# Patient Record
Sex: Male | Born: 1940
Health system: Southern US, Community
[De-identification: ages and names within clinical notes are randomized; demographics above are authoritative.]

## PROBLEM LIST (undated history)

## (undated) DIAGNOSIS — F32A Depression, unspecified: Secondary | ICD-10-CM

## (undated) DIAGNOSIS — F329 Major depressive disorder, single episode, unspecified: Secondary | ICD-10-CM

## (undated) DIAGNOSIS — R42 Dizziness and giddiness: Secondary | ICD-10-CM

## (undated) DIAGNOSIS — L03031 Cellulitis of right toe: Secondary | ICD-10-CM

## (undated) DIAGNOSIS — L02612 Cutaneous abscess of left foot: Secondary | ICD-10-CM

## (undated) DIAGNOSIS — R51 Headache: Secondary | ICD-10-CM

## (undated) DIAGNOSIS — R519 Headache, unspecified: Secondary | ICD-10-CM

## (undated) DIAGNOSIS — M79671 Pain in right foot: Secondary | ICD-10-CM

## (undated) DIAGNOSIS — Z01810 Encounter for preprocedural cardiovascular examination: Secondary | ICD-10-CM

## (undated) DIAGNOSIS — N183 Chronic kidney disease, stage 3 unspecified: Secondary | ICD-10-CM

## (undated) DIAGNOSIS — I739 Peripheral vascular disease, unspecified: Secondary | ICD-10-CM

## (undated) DIAGNOSIS — I639 Cerebral infarction, unspecified: Secondary | ICD-10-CM

## (undated) DIAGNOSIS — E785 Hyperlipidemia, unspecified: Secondary | ICD-10-CM

## (undated) DIAGNOSIS — K5900 Constipation, unspecified: Secondary | ICD-10-CM

## (undated) DIAGNOSIS — R011 Cardiac murmur, unspecified: Secondary | ICD-10-CM

## (undated) DIAGNOSIS — E538 Deficiency of other specified B group vitamins: Secondary | ICD-10-CM

## (undated) DIAGNOSIS — M869 Osteomyelitis, unspecified: Secondary | ICD-10-CM

## (undated) DIAGNOSIS — R634 Abnormal weight loss: Secondary | ICD-10-CM

## (undated) DIAGNOSIS — E7800 Pure hypercholesterolemia, unspecified: Secondary | ICD-10-CM

## (undated) DIAGNOSIS — I208 Other forms of angina pectoris: Secondary | ICD-10-CM

## (undated) DIAGNOSIS — M792 Neuralgia and neuritis, unspecified: Secondary | ICD-10-CM

## (undated) DIAGNOSIS — E875 Hyperkalemia: Secondary | ICD-10-CM

## (undated) DIAGNOSIS — I10 Essential (primary) hypertension: Secondary | ICD-10-CM

## (undated) HISTORY — DX: Encounter for preprocedural cardiovascular examination: Z01.810

## (undated) HISTORY — DX: Osteomyelitis, unspecified: M86.9

## (undated) HISTORY — DX: Constipation, unspecified: K59.00

## (undated) HISTORY — DX: Pain in right foot: M79.671

## (undated) HISTORY — DX: Hyperlipidemia, unspecified: E78.5

## (undated) HISTORY — DX: Other forms of angina pectoris: I20.8

## (undated) HISTORY — DX: Deficiency of other specified B group vitamins: E53.8

## (undated) HISTORY — DX: Abnormal weight loss: R63.4

## (undated) HISTORY — DX: Headache: R51

## (undated) HISTORY — DX: Neuralgia and neuritis, unspecified: M79.2

## (undated) HISTORY — DX: Cellulitis of right toe: L03.031

## (undated) HISTORY — DX: Dizziness and giddiness: R42

---
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
## 2006-04-08 ENCOUNTER — Emergency Department (HOSPITAL_COMMUNITY): Admission: EM | Admit: 2006-04-08 | Discharge: 2006-04-08 | Payer: Self-pay | Admitting: Emergency Medicine

## 2009-09-22 ENCOUNTER — Inpatient Hospital Stay (HOSPITAL_COMMUNITY): Admission: EM | Admit: 2009-09-22 | Discharge: 2009-09-25 | Payer: Self-pay | Admitting: Emergency Medicine

## 2010-04-28 ENCOUNTER — Inpatient Hospital Stay (HOSPITAL_COMMUNITY): Admission: EM | Admit: 2010-04-28 | Discharge: 2010-04-30 | Payer: Self-pay | Admitting: Emergency Medicine

## 2010-04-30 ENCOUNTER — Ambulatory Visit: Payer: Self-pay | Admitting: Dentistry

## 2010-12-14 ENCOUNTER — Emergency Department (HOSPITAL_COMMUNITY)
Admission: EM | Admit: 2010-12-14 | Discharge: 2010-12-14 | Payer: Self-pay | Source: Home / Self Care | Admitting: Emergency Medicine

## 2011-03-03 LAB — CBC
Hemoglobin: 12.2 g/dL — ABNORMAL LOW (ref 13.0–17.0)
MCHC: 33.9 g/dL (ref 30.0–36.0)
MCV: 98.3 fL (ref 78.0–100.0)
RBC: 3.67 MIL/uL — ABNORMAL LOW (ref 4.22–5.81)

## 2011-03-03 LAB — BASIC METABOLIC PANEL
BUN: 15 mg/dL (ref 6–23)
BUN: 15 mg/dL (ref 6–23)
CO2: 26 mEq/L (ref 19–32)
CO2: 26 mEq/L (ref 19–32)
Calcium: 8.2 mg/dL — ABNORMAL LOW (ref 8.4–10.5)
Calcium: 8.4 mg/dL (ref 8.4–10.5)
Calcium: 8.7 mg/dL (ref 8.4–10.5)
Chloride: 105 mEq/L (ref 96–112)
Creatinine, Ser: 1.21 mg/dL (ref 0.4–1.5)
Creatinine, Ser: 1.26 mg/dL (ref 0.4–1.5)
GFR calc Af Amer: >60 mL/min (ref >60)
GFR calc non Af Amer: 52 mL/min — ABNORMAL LOW (ref >60)
GFR calc non Af Amer: 56 mL/min — ABNORMAL LOW (ref >60)
GFR calc non Af Amer: 59 mL/min — ABNORMAL LOW (ref >60)
GFR calc non Af Amer: 59 mL/min — ABNORMAL LOW (ref >60)
Glucose, Bld: 71 mg/dL (ref 70–99)
Glucose, Bld: 86 mg/dL (ref 70–99)
Glucose, Bld: 89 mg/dL (ref 70–99)
Potassium: 7 mEq/L (ref 3.5–5.1)
Sodium: 138 mEq/L (ref 135–145)

## 2011-03-03 LAB — OSMOLALITY: Osmolality: 287 mOsm/kg (ref 275–300)

## 2011-03-03 LAB — CULTURE, BLOOD (ROUTINE X 2): Culture: NO GROWTH

## 2011-03-03 LAB — DIFFERENTIAL
Basophils Relative: 1 % (ref 0–1)
Eosinophils Absolute: 0.2 10*3/uL (ref 0.0–0.7)
Monocytes Absolute: 0.6 10*3/uL (ref 0.1–1.0)
Monocytes Relative: 9 % (ref 3–12)

## 2011-03-03 LAB — TROPONIN I
Troponin I: 0.01 ng/mL (ref 0.00–0.06)
Troponin I: 0.03 ng/mL (ref 0.00–0.06)

## 2011-03-03 LAB — CK TOTAL AND CKMB (NOT AT ARMC)
CK, MB: 5 ng/mL — ABNORMAL HIGH (ref 0.3–4.0)
CK, MB: 6.4 ng/mL (ref 0.3–4.0)
Relative Index: 2.1 (ref 0.0–2.5)
Total CK: 241 U/L — ABNORMAL HIGH (ref 7–232)

## 2011-03-03 LAB — RAPID URINE DRUG SCREEN, HOSP PERFORMED
Amphetamines: NOT DETECTED
Benzodiazepines: POSITIVE — AB
Cocaine: NOT DETECTED

## 2011-03-03 LAB — CARDIAC PANEL(CRET KIN+CKTOT+MB+TROPI)
Total CK: 172 U/L (ref 7–232)
Troponin I: 0.01 ng/mL (ref 0.00–0.06)

## 2011-03-03 LAB — HEPATIC FUNCTION PANEL
Bilirubin, Direct: <0.1 mg/dL (ref 0.0–0.3)
Total Bilirubin: 0.6 mg/dL (ref 0.3–1.2)
Total Protein: 6.8 g/dL (ref 6.0–8.3)

## 2011-03-03 LAB — OSMOLALITY, URINE: Osmolality, Ur: 462 mOsm/kg (ref 390–1090)

## 2011-03-03 LAB — NA AND K (SODIUM & POTASSIUM), RAND UR: Potassium Urine: 10 mEq/L

## 2011-03-04 LAB — DIFFERENTIAL
Eosinophils Absolute: 0.2 10*3/uL (ref 0.0–0.7)
Lymphocytes Relative: 29 % (ref 12–46)
Lymphs Abs: 2 10*3/uL (ref 0.7–4.0)
Lymphs Abs: 2.2 10*3/uL (ref 0.7–4.0)
Monocytes Absolute: 0.5 10*3/uL (ref 0.1–1.0)
Monocytes Relative: 7 % (ref 3–12)
Neutro Abs: 4.4 10*3/uL (ref 1.7–7.7)
Neutrophils Relative %: 62 % (ref 43–77)

## 2011-03-04 LAB — BASIC METABOLIC PANEL
BUN: 18 mg/dL (ref 6–23)
CO2: 20 mEq/L (ref 19–32)
Calcium: 9.1 mg/dL (ref 8.4–10.5)
Calcium: 9.3 mg/dL (ref 8.4–10.5)
Chloride: 109 mEq/L (ref 96–112)
Creatinine, Ser: 1.08 mg/dL (ref 0.4–1.5)
GFR calc Af Amer: >60 mL/min (ref >60)
GFR calc non Af Amer: 57 mL/min — ABNORMAL LOW (ref >60)
GFR calc non Af Amer: >60 mL/min (ref >60)
Sodium: 134 mEq/L — ABNORMAL LOW (ref 135–145)

## 2011-03-04 LAB — CBC
Hemoglobin: 13.9 g/dL (ref 13.0–17.0)
MCHC: 34 g/dL (ref 30.0–36.0)
MCV: 98.1 fL (ref 78.0–100.0)
Platelets: 226 10*3/uL (ref 150–400)
RBC: 4.15 MIL/uL — ABNORMAL LOW (ref 4.22–5.81)
WBC: 7.6 10*3/uL (ref 4.0–10.5)

## 2011-03-20 LAB — RAPID URINE DRUG SCREEN, HOSP PERFORMED
Amphetamines: NOT DETECTED
Barbiturates: NOT DETECTED
Benzodiazepines: NOT DETECTED
Cocaine: NOT DETECTED
Opiates: NOT DETECTED

## 2011-03-20 LAB — BASIC METABOLIC PANEL
BUN: 17 mg/dL (ref 6–23)
BUN: 21 mg/dL (ref 6–23)
CO2: 25 mEq/L (ref 19–32)
CO2: 26 mEq/L (ref 19–32)
Calcium: 9 mg/dL (ref 8.4–10.5)
Chloride: 103 mEq/L (ref 96–112)
Chloride: 104 mEq/L (ref 96–112)
Creatinine, Ser: 1.18 mg/dL (ref 0.4–1.5)
GFR calc Af Amer: >60 mL/min (ref >60)
GFR calc non Af Amer: >60 mL/min (ref >60)
GFR calc non Af Amer: >60 mL/min (ref >60)
Glucose, Bld: 133 mg/dL — ABNORMAL HIGH (ref 70–99)
Glucose, Bld: 142 mg/dL — ABNORMAL HIGH (ref 70–99)
Potassium: 4.3 mEq/L (ref 3.5–5.1)
Potassium: 5.6 mEq/L — ABNORMAL HIGH (ref 3.5–5.1)
Sodium: 136 mEq/L (ref 135–145)
Sodium: 138 mEq/L (ref 135–145)

## 2011-03-20 LAB — CBC
HCT: 37.9 % — ABNORMAL LOW (ref 39.0–52.0)
HCT: 38.3 % — ABNORMAL LOW (ref 39.0–52.0)
Hemoglobin: 12.7 g/dL — ABNORMAL LOW (ref 13.0–17.0)
Hemoglobin: 12.7 g/dL — ABNORMAL LOW (ref 13.0–17.0)
MCHC: 33.2 g/dL (ref 30.0–36.0)
MCHC: 33.3 g/dL (ref 30.0–36.0)
MCHC: 33.6 g/dL (ref 30.0–36.0)
MCV: 97.3 fL (ref 78.0–100.0)
MCV: 97.7 fL (ref 78.0–100.0)
MCV: 98.3 fL (ref 78.0–100.0)
Platelets: 257 10*3/uL (ref 150–400)
Platelets: 267 10*3/uL (ref 150–400)
Platelets: 276 10*3/uL (ref 150–400)
RBC: 3.89 MIL/uL — ABNORMAL LOW (ref 4.22–5.81)
RBC: 3.89 MIL/uL — ABNORMAL LOW (ref 4.22–5.81)
RDW: 13.7 % (ref 11.5–15.5)
RDW: 14.1 % (ref 11.5–15.5)
WBC: 17.5 10*3/uL — ABNORMAL HIGH (ref 4.0–10.5)
WBC: 9.1 10*3/uL (ref 4.0–10.5)

## 2011-03-20 LAB — COMPREHENSIVE METABOLIC PANEL
AST: 23 U/L (ref 0–37)
Albumin: 4.2 g/dL (ref 3.5–5.2)
BUN: 22 mg/dL (ref 6–23)
Calcium: 9.2 mg/dL (ref 8.4–10.5)
Creatinine, Ser: 1.26 mg/dL (ref 0.4–1.5)
GFR calc Af Amer: >60 mL/min (ref >60)
GFR calc non Af Amer: 57 mL/min — ABNORMAL LOW (ref >60)

## 2011-03-20 LAB — POTASSIUM: Potassium: 6 mEq/L — ABNORMAL HIGH (ref 3.5–5.1)

## 2011-03-20 LAB — ETHANOL: Alcohol, Ethyl (B): 121 mg/dL — ABNORMAL HIGH (ref 0–10)

## 2012-02-27 ENCOUNTER — Emergency Department (HOSPITAL_COMMUNITY)
Admission: EM | Admit: 2012-02-27 | Discharge: 2012-02-27 | Disposition: A | Discharge status: Home / Self Care | Payer: Medicare Other | Attending: Emergency Medicine | Admitting: Emergency Medicine

## 2012-02-27 ENCOUNTER — Emergency Department (HOSPITAL_COMMUNITY): Payer: Medicare Other

## 2012-02-27 ENCOUNTER — Encounter (HOSPITAL_COMMUNITY): Payer: Self-pay | Admitting: Emergency Medicine

## 2012-02-27 DIAGNOSIS — N289 Disorder of kidney and ureter, unspecified: Secondary | ICD-10-CM

## 2012-02-27 DIAGNOSIS — R079 Chest pain, unspecified: Secondary | ICD-10-CM | POA: Diagnosis not present

## 2012-02-27 DIAGNOSIS — M6281 Muscle weakness (generalized): Secondary | ICD-10-CM | POA: Diagnosis not present

## 2012-02-27 DIAGNOSIS — E875 Hyperkalemia: Secondary | ICD-10-CM | POA: Diagnosis not present

## 2012-02-27 DIAGNOSIS — I10 Essential (primary) hypertension: Secondary | ICD-10-CM | POA: Diagnosis not present

## 2012-02-27 DIAGNOSIS — R279 Unspecified lack of coordination: Secondary | ICD-10-CM | POA: Insufficient documentation

## 2012-02-27 DIAGNOSIS — R251 Tremor, unspecified: Secondary | ICD-10-CM

## 2012-02-27 DIAGNOSIS — F17200 Nicotine dependence, unspecified, uncomplicated: Secondary | ICD-10-CM | POA: Diagnosis not present

## 2012-02-27 DIAGNOSIS — R259 Unspecified abnormal involuntary movements: Secondary | ICD-10-CM | POA: Diagnosis not present

## 2012-02-27 DIAGNOSIS — I999 Unspecified disorder of circulatory system: Secondary | ICD-10-CM | POA: Diagnosis not present

## 2012-02-27 HISTORY — DX: Essential (primary) hypertension: I10

## 2012-02-27 LAB — URINALYSIS, ROUTINE W REFLEX MICROSCOPIC
Glucose, UA: NEGATIVE mg/dL
Ketones, ur: NEGATIVE mg/dL
Leukocytes, UA: NEGATIVE
Specific Gravity, Urine: 1.019 (ref 1.005–1.030)
pH: 5.5 (ref 5.0–8.0)

## 2012-02-27 LAB — AMMONIA: Ammonia: 42 umol/L (ref 11–60)

## 2012-02-27 LAB — BASIC METABOLIC PANEL
CO2: 24 mEq/L (ref 19–32)
Chloride: 108 mEq/L (ref 96–112)
Sodium: 140 mEq/L (ref 135–145)

## 2012-02-27 LAB — DIFFERENTIAL
Eosinophils Relative: 2 % (ref 0–5)
Lymphocytes Relative: 24 % (ref 12–46)
Lymphs Abs: 1.6 10*3/uL (ref 0.7–4.0)
Neutro Abs: 4.1 10*3/uL (ref 1.7–7.7)

## 2012-02-27 LAB — RAPID URINE DRUG SCREEN, HOSP PERFORMED
Amphetamines: NOT DETECTED
Barbiturates: NOT DETECTED
Benzodiazepines: NOT DETECTED
Cocaine: NOT DETECTED
Tetrahydrocannabinol: NOT DETECTED

## 2012-02-27 LAB — CBC
HCT: 37.3 % — ABNORMAL LOW (ref 39.0–52.0)
MCV: 97.1 fL (ref 78.0–100.0)
Platelets: 279 10*3/uL (ref 150–400)
RBC: 3.84 MIL/uL — ABNORMAL LOW (ref 4.22–5.81)
WBC: 6.5 10*3/uL (ref 4.0–10.5)

## 2012-02-27 LAB — URINE MICROSCOPIC-ADD ON

## 2012-02-27 NOTE — ED Notes (Signed)
States sx for 2 days

## 2012-02-27 NOTE — ED Notes (Signed)
Patient provided with PO fluids per Dr. Beatriz Chancellor orders.

## 2012-02-27 NOTE — ED Notes (Signed)
Dr. Eulis Foster requesting patient to go to CDU, CDU unable to take patient at this time.

## 2012-02-27 NOTE — Discharge Instructions (Signed)
Call Dr. Brett Fairy to be seen about the tremor. Use of the resource guide to find a Dr. to see for a checkup in one week.  They will need to recheck your blood counts, to evaluate your kidney function and your potassium.  Return here if needed for problems    RESOURCE GUIDE  Dental Problems  Patients with Medicaid: [facility] [phone] [address] Phone:  [phone]                                                  Phone:  [phone]  If unable to pay or uninsured, contact:  [facility] or [facility]. to become qualified for the adult dental clinic.  Chronic Pain Problems Contact [facility]  [phone] Patients need to be referred by their primary care doctor.  Insufficient Money for Medicine Contact [employer]:  call "211" or [place] [phone].  No Primary Care Doctor Call [facility]  [phone] Other agencies that provide inexpensive medical care    [place]  [phone]    [facility] Internal [facility]  [place]  [phone]    [facility]  [phone]    [facility]  [place]  [place]  [phone] [place]   [phone] (emergency services [phone])  [facility]  [phone] [facility] [phone] [facility] [place] [phone] [place] [place] [phone] Residential & Outpatient Substance Abuse Program  [phone]  Abuse/Neglect [place] [phone] [place] [phone] (After Hours)  Emergency [place] [phone]  [place] [facility] [place] [phone] [place] [phone]  MRSA Hotline #:   757-801-2241    [facility] Dept. [address]      [place] Phone:  [phone]  Phone:  [phone]                 Phone:  [place] Phone:  [place] [phone] (After Hours)   Hyperkalemia Hyperkalemia is when you have too much potassium in your blood. This can be a life-threatening condition. Potassium is normally removed (excreted) from the body by the kidneys. CAUSES  The potassium level in your body can become too high for the following reasons:  You take in too much potassium. You can do this by:   Using salt substitutes. They contain large amounts of potassium.   Taking potassium supplements from your caregiver. The dose may be too high for you.   Eating foods or taking nutritional products with potassium.   You excrete too little potassium. This can happen if:   Your kidneys are not functioning properly. Kidney (renal) disease is a very common cause of hyperkalemia.   You are taking medicines that lower your excretion of potassium, such as certain diuretic medicines.   You have an adrenal gland disease called Addison's disease.   You have a urinary tract obstruction, such as kidney stones.   You are on treatment to mechanically clean your blood (dialysis) and you skip a treatment.   You release a high amount of potassium from your cells into your blood. You may have a condition that causes potassium to move from your cells to your bloodstream. This can happen with:   Injury to muscles or other tissues. Most potassium  is stored in the muscles.   Severe burns or infections.   Acidic blood plasma (acidosis). Acidosis can result from many diseases, such as uncontrolled diabetes.  SYMPTOMS  Usually, there are no symptoms unless the potassium is dangerously high or has risen very quickly. Symptoms may include:  Irregular or very slow heartbeat.   Feeling sick to your stomach (nauseous).   Tiredness (fatigue).   Nerve problems such as tingling of the skin, numbness of the hands or feet, weakness, or paralysis.  DIAGNOSIS  A simple blood test can measure the amount of potassium in your body. An electrocardiogram test of the heart can also help make the diagnosis. The heart may beat dangerously fast or slow down and stop beating with severe hyperkalemia.  TREATMENT  Treatment depends on how bad the condition is and on the underlying cause.  If the hyperkalemia is an emergency (causing heart problems or paralysis), many different medicines can be used alone or together to lower the potassium level briefly. This may include an insulin injection even if you are not diabetic. Emergency dialysis may be needed to remove potassium from the body.   If the hyperkalemia is less severe or dangerous, the underlying cause is treated. This can include taking medicines if needed. Your prescription medicines may be changed. You may also need to take a medicine to help your body get rid of potassium. You may need to eat a diet low in potassium.  HOME CARE INSTRUCTIONS   Take medicines and supplements as directed by your caregiver.   Do not take any over-the-counter medicines, supplements, natural products, herbs, or vitamins without reviewing them with your caregiver. Certain supplements and natural food products can have high amounts of potassium. Other products (such as ibuprofen) can damage weak kidneys and raise your potassium.   You may be asked to do repeat lab tests. Be sure to follow these directions.   If you  have  kidney disease, you may need to follow a low potassium diet.  SEEK MEDICAL CARE IF:   You notice an irregular or very slow heartbeat.   You feel lightheaded.   You develop weakness that is unusual for you.  SEEK IMMEDIATE MEDICAL CARE IF:   You have shortness of breath.   You have chest discomfort.   You pass out (faint).  MAKE SURE YOU:   Understand these instructions.   Will watch your condition.   Will get help right away if you are not doing well or get worse.  Document Released: 11/21/2002 Document Revised: 11/20/2011 Document Reviewed: 05/08/2011 [facility] Patient Information 2012 [place], [place].Kidney Failure In kidney failure, the kidneys lose their ability to filter enough waste products from the blood. They also lose the ability to regulate the body's balance of salt and water. Eventually, the kidneys slow their production of urine or stop producing it completely. Waste products and water gather in the body. This can lead to a life-threatening overload of fluids (such as heart failure). It can also lead to a dangerous buildup of waste products in the blood. These extreme changes in blood chemistry can affect the function of the heart and brain.  TYPES OF KIDNEY FAILURE Acute kidney failure. In this form of kidney failure, the kidneys stop working properly because of a sudden illness, a medicine, or a medical condition that causes one of the following:   A severe drop in blood pressure or an interruption in the normal blood flow to the kidneys. This can occur during:   Major surgery.   Severe burns with fluid loss.   Massive bleeding.   A heart attack that severely affects heart function.   Blood clots that travel to the kidney.   Direct damage to kidney cells or to the kidneys' filtering units. This can be caused by:   An inflammation of the kidneys.   Toxic chemicals.   Medicines or infections.   Blocked urine flow from the kidney. This can occur because of  obstructions outside the kidney, such as:   Kidney stones.   Bladder tumors.   An enlarged prostate.  Blockage of urine flow within the kidney can also cause sudden kidney failure, as can occur with large muscle injuries.  Chronic kidney failure. In this form of kidney failure, the kidney gradually loses function. This happens over a period of years. It is a slow and gradual loss of the ability of the kidneys to send out wastes, concentrate urine, and conserve the salts in your blood. Some of the causes of chronic kidney failure are:  Diabetes (very common cause).   Polycystic kidney disease.   Glomerulonephritis.   Alport syndrome.   The flow of urine out of the kidney is blocked (obstructive uropathy).   High blood pressure (very common).   Long-term exposure to lead, mercury, and other chemicals and medicines.   Kidney stones with infection.   Reflux nephropathy.   Pain medicine overuse.  Some forms of chronic kidney failure run in families. Your caregiver will ask you about family medical problems.  End-stage kidney disease (ESKD). This is also called end-stage kidney failure. In ESKD, kidney function worsens until the person dies. This is usually the result of longstanding chronic kidney failure, but sometimes it follows acute kidney failure. SYMPTOMS  Symptoms vary depending on the type of kidney failure.   Acute kidney failure. Symptoms include:   Swelling (edema) resulting from salt and water overload.   High  blood pressure.   Vomiting.   Tiredness (lethargy) caused by the toxic effects of waste products on brain function.   Feeling sick to your stomach (nauseous).   Decreased urine output.   Chronic kidney failure and ERSD. Because the kidney damage in chronic kidney failure occurs slowly over a long time, symptoms develop slowly. Symptoms can include:   Headache.   Weakness.   Itching.   Vomiting.   Pale skin.   Slowing of growth in children.    Fatigue.   Tiredness (lethargy).   Poor appetite.   Increased thirst.   High blood pressure.   Bone damage in adults.  DIAGNOSIS  If you have an illness or medical condition that increases the risk of acute kidney failure, your caregivers will watch you closely. You may have blood and urine tests that measure the function of your kidneys. If you have a medical condition that increases the risk of long-term kidney damage, your caregiver will check your blood pressure and look for symptoms of chronic kidney failure during rechecks. TREATMENT  Treatment depends on the type of kidney failure.   Acute kidney failure. Treatment begins with measures to correct the cause of kidney failure (shock, hemorrhage, burns, heart attack). After this has begun, more specific kidney treatment may include:   Fluids given through the vein (intravenously) to correct any abnormal fluid loss.   Medicines called diuretics that increase urine output.   Limited fluids by mouth.   A diet low in protein and high in carbohydrates.   Medicines to adjust high or low levels of blood chemicals, such as potassium and medicines to control high blood pressure.   Short-term dialysis may be necessary if the patient develops severe high blood pressure, severe fluid overload, heart failure, symptoms of altered brain function, or severe abnormalities in blood chemistry.   Chronic kidney failure. People with chronic kidney failure are watched closely. They receive frequent physical exams, blood pressure checks, and blood testing. Treatment includes:   A low-protein and low-salt diet.   Medicines to adjust blood chemical levels.   Medicines to treat high blood pressure.   Sometimes, a hormonal medicine called erythropoietin is given to correct a low level of red blood cells (anemia).   ESKD. Treatment includes:   Dialysis until a donor can be found for a kidney transplant. Dialysis mechanically removes waste  products from the blood.   Both kidneys may need to be removed surgically before a transplant in patients with severe high blood pressure or chronic pyelonephritis.  PROGNOSIS   Acute kidney failure may go away on its own. Some people recover within a matter of days. Exactly how long the illness lasts varies greatly from person-to-person. The duration depends on the cause of the kidney problem. In rare cases, acute kidney failure progresses to ESKD. Among people who recover, about 50% have some permanent kidney damage. In most cases, this is not severe enough to prevent you from living a normal life.   Chronic kidney failure is a lifelong problem that can worsen over time to become ESKD. Not everyone develops ESKD. For those who do, the time it takes for ESKD to develop varies from person-to-person.   ESKD is a permanent condition that can be treated only with dialysis or a kidney transplant.  PREVENTION  Many forms of kidney failure cannot be prevented. People who have diabetes, high blood pressure, or coronary artery disease should try to control the illness with:  Appropriate diet.   Medicine.  Lifestyle changes.  If you have chronic kidney failure, you should tell all caregivers who treat you.  HOME CARE INSTRUCTIONS   Follow your diet and take your medicines as instructed.   Do not use any new medicines (prescription, over-the-counter, or nutritional supplements) unless approved by your caregiver. Many medicines can worsen your kidney damage or need to have the dose adjusted.   If dialysis is scheduled, keep all appointments. Call if you are unable to keep an appointment.  SEEK MEDICAL CARE IF:   You develop unexplained weakness, tiredness, or appetite loss.   You feel poorly with no clear explanation.  SEEK IMMEDIATE MEDICAL CARE IF:   The amount of urine you produce either distinctly increases or decreases.   You develop swelling of the face and/or ankles.   You develop  shortness of breath.  [place] of Diabetes and Digestive and Kidney Diseases: [employer] National Kidney Foundation: www.kidney.org Document Released: 12/01/2005 Document Revised: 11/20/2011 Document Reviewed: 04/03/2010 [facility] Patient Information 2012 [place], [place].Tremor Tremor is a rhythmic, involuntary muscular contraction characterized by oscillations (to-and-fro movements) of a part of the body. The most common of all involuntary movements, tremor can affect various body parts such as the hands, head, facial structures, vocal cords, trunk, and legs; most tremors, however, occur in the hands. Tremor often accompanies neurological disorders associated with aging. Although the disorder is not life-threatening, it can be responsible for functional disability and social embarrassment. TREATMENT  There are many types of tremor and several ways in which tremor is classified. The most common classification is by behavioral context or position. There are five categories of tremor within this classification: resting, postural, kinetic, task-specific, and psychogenic. Resting or static tremor occurs when the muscle is at rest, for example when the hands are lying on the lap. This type of tremor is often seen in patients with Parkinson's disease. Postural tremor occurs when a patient attempts to maintain posture, such as holding the hands outstretched. Postural tremors include physiological tremor, essential tremor, tremor with basal ganglia disease (also seen in patients with Parkinson's disease), cerebellar postural tremor, tremor with peripheral neuropathy, post-traumatic tremor, and alcoholic tremor. Kinetic or intention (action) tremor occurs during purposeful movement, for example during finger-to-nose testing. Task-specific tremor appears when performing goal-oriented tasks such as handwriting, speaking, or standing. This group consists of primary writing tremor,  vocal tremor, and orthostatic tremor. Psychogenic tremor occurs in both older and younger patients. The key feature of this tremor is that it dramatically lessens or disappears when the patient is distracted. PROGNOSIS There are some treatment options available for tremor; the appropriate treatment depends on accurate diagnosis of the cause. Some tremors respond to treatment of the underlying condition, for example in some cases of psychogenic tremor treating the patient's underlying mental problem may cause the tremor to disappear. Also, patients with tremor due to Parkinson's disease may be treated with Levodopa drug therapy. Symptomatic drug therapy is available for several other tremors as well. For those cases of tremor in which there is no effective drug treatment, physical measures such as teaching the patient to brace the affected limb during the tremor are sometimes useful. Surgical intervention such as thalamotomy or deep brain stimulation may be useful in certain cases. Document Released: 11/21/2002 Document Revised: 11/20/2011 Document Reviewed: 12/01/2005 [facility] Patient Information 2012 [place].

## 2012-02-27 NOTE — ED Provider Notes (Signed)
History     CSN: [id number]  Arrival date & time 02/27/12  1029   First MD Initiated Contact with Patient 02/27/12 1239      Chief Complaint  Patient presents with  . Weakness    (Consider location/radiation/quality/duration/timing/severity/associated sxs/prior treatment) HPI Comments: Derrick Hendrix is a 71 y.o. Male who has had problems with coordination of his left arm and leg for 2 days. He is not weak. 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  He denies headache, nausea, vomiting, fever or chills. He drinks alcohol, but denies being intoxicated, now. He has not been using illicit drugs. He has not taken medicine for the problem. The problem is worse when he ambulates or tries to pick things up.   The history is provided by the patient.    Past Medical History  Diagnosis Date  . Hypertension     No past surgical history on file.  No family history on file.  History  Substance Use Topics  . Smoking status: Current Everyday Smoker  . Smokeless tobacco: Not on file  . Alcohol Use: Yes      Review of Systems  Neurological: Positive for weakness.  All other systems reviewed and are negative.    Allergies  Review of patient's allergies indicates no known allergies.  Home Medications  No current outpatient prescriptions on file.  BP 173/89  Pulse 88  Temp(Src) 98.1 F (36.7 C) (Oral)  Resp 18  SpO2 98%  Physical Exam  Nursing note and vitals reviewed. Constitutional: He is oriented to person, place, and time. He appears well-developed and well-nourished.  HENT:  Head: Normocephalic and atraumatic.  Right Ear: External ear normal.  Left Ear: External ear normal.  Eyes: Conjunctivae and EOM are normal. Pupils are equal, round, and reactive to light.  Neck: Normal range of motion and phonation normal. Neck supple.  Cardiovascular: Normal rate, regular rhythm, normal heart sounds and intact distal pulses.   Pulmonary/Chest: Effort normal and breath  sounds normal. He exhibits no bony tenderness.  Abdominal: Soft. Normal appearance. There is no tenderness.  Musculoskeletal: Normal range of motion.  Neurological: He is alert and oriented to person, place, and time. He has normal strength. No cranial nerve deficit or sensory deficit. He exhibits normal muscle tone. Coordination normal.       He is able ambulate, but his gait is somewhat unsteady with drifting to the left. He has intermittent tremor of the left arm and leg ; that is worse when attempting to hold his arm out or when he attempts to walk  Skin: Skin is warm, dry and intact.  Psychiatric: He has a normal mood and affect. His behavior is normal. Judgment and thought content normal.    ED Course  Procedures (including critical care time) 14:45- patient's symptoms are not consistent with a code stroke, due to duration of symptoms greater than 8 hours.     Labs Reviewed  BASIC METABOLIC PANEL - Abnormal; Notable for the following:    Potassium 5.2 (*)    Glucose, Bld 106 (*)    BUN 27 (*)    Creatinine, Ser 1.60 (*)    GFR calc non Af Amer 41 (*)    GFR calc Af Amer 48 (*)    All other components within normal limits  CBC - Abnormal; Notable for the following:    RBC 3.84 (*)    Hemoglobin 12.2 (*)    HCT 37.3 (*)    All other components within  normal limits  URINALYSIS, ROUTINE W REFLEX MICROSCOPIC - Abnormal; Notable for the following:    Protein, ur 100 (*)    All other components within normal limits  URINE MICROSCOPIC-ADD ON - Abnormal; Notable for the following:    Casts HYALINE CASTS (*)    All other components within normal limits  DIFFERENTIAL  URINE CULTURE  AMMONIA  ETHANOL  URINE RAPID DRUG SCREEN (HOSP PERFORMED)   Dg Chest 2 View  02/27/2012  *RADIOLOGY REPORT*  Clinical Data: Left-sided chest pain, tremor  CHEST - 2 VIEW  Comparison: Portable chest x-ray of 09/25/2009  Findings: No active infiltrate or effusion is seen.  Mediastinal contours are  normal.  The heart is within upper limits normal.  An old healed left posterior sixth rib fracture is noted.  IMPRESSION: Slight hyperaeration.  No active lung disease.  Original Report Authenticated By: Joretta Bachelor, M.D.   Ct Head Wo Contrast  02/27/2012  *RADIOLOGY REPORT*  Clinical Data: Right-sided weakness.  Ataxia.  Tremors.  CT HEAD WITHOUT CONTRAST  Technique:  Contiguous axial images were obtained from the base of the skull through the vertex without contrast.  Comparison: 12/14/2010  Findings: Bone windows demonstrate left frontal skin tag about the scalp which is unchanged on image 10. Clear paranasal sinuses and mastoid air cells.  Soft tissue windows demonstrate mild to moderate low density in the periventricular white matter likely related to small vessel disease.Physiologic calcifications within the basal ganglia bilaterally.No  mass lesion, hemorrhage, hydrocephalus, acute infarct, intra-axial, or extra-axial fluid collection.  IMPRESSION:  1. No acute intracranial abnormality. 2.  Similar mild to moderate small vessel ischemic change.  Original Report Authenticated By: Areta Haber, M.D.     1. Tremor   2. Renal insufficiency   3. Hyperkalemia       MDM  Nonspecific c/o with ataxia and tremor. No apparent CVA on clinical exam. The tremor is not present at rest. He has elevated creatinine, consistent with mild dehydration, and slightly elevated potassium.  Plan: Home Medications- none; Home Treatments- increase fluids, avoid alcohol; Recommended follow up- See PCP for repeat blood testing in 1 week, and see Neurology regarding the tremor in 1-2 weeks.     Richarda Blade, MD 02/28/12 [phone]

## 2012-02-27 NOTE — ED Notes (Signed)
Patient presents with weakness and left upper and lower extremity "shaking" x several days.  Tremor noted to left upper and lower extremities, patient has a negative stroke screen, denies chest pain, SOB, n/v. Patient ambulatory in department, no distress noted, skin warm, dry and intact.

## 2012-02-27 NOTE — ED Notes (Signed)
Has had a cough and  Left arm is sore from hand to elbow

## 2012-02-27 NOTE — ED Notes (Signed)
Pt states he just got off work and states he cannt control his left side has been going on x 2 days

## 2012-02-27 NOTE — ED Notes (Signed)
CDU still not available to take patient.  CDU to call when a bed becomes available.

## 2012-02-28 LAB — URINE CULTURE
Colony Count: NO GROWTH
Culture  Setup Time: 201303151536

## 2013-04-30 ENCOUNTER — Emergency Department (HOSPITAL_COMMUNITY): Payer: Medicare Other

## 2013-04-30 ENCOUNTER — Encounter (HOSPITAL_COMMUNITY): Payer: Self-pay | Admitting: *Deleted

## 2013-04-30 ENCOUNTER — Emergency Department (HOSPITAL_COMMUNITY)
Admission: EM | Admit: 2013-04-30 | Discharge: 2013-04-30 | Disposition: A | Discharge status: Home / Self Care | Payer: Medicare Other | Attending: Emergency Medicine | Admitting: Emergency Medicine

## 2013-04-30 DIAGNOSIS — F17200 Nicotine dependence, unspecified, uncomplicated: Secondary | ICD-10-CM | POA: Insufficient documentation

## 2013-04-30 DIAGNOSIS — Z23 Encounter for immunization: Secondary | ICD-10-CM | POA: Insufficient documentation

## 2013-04-30 DIAGNOSIS — Y939 Activity, unspecified: Secondary | ICD-10-CM | POA: Insufficient documentation

## 2013-04-30 DIAGNOSIS — S61209A Unspecified open wound of unspecified finger without damage to nail, initial encounter: Secondary | ICD-10-CM | POA: Insufficient documentation

## 2013-04-30 DIAGNOSIS — IMO0002 Reserved for concepts with insufficient information to code with codable children: Secondary | ICD-10-CM | POA: Insufficient documentation

## 2013-04-30 DIAGNOSIS — S6990XA Unspecified injury of unspecified wrist, hand and finger(s), initial encounter: Secondary | ICD-10-CM | POA: Diagnosis not present

## 2013-04-30 DIAGNOSIS — I10 Essential (primary) hypertension: Secondary | ICD-10-CM | POA: Insufficient documentation

## 2013-04-30 DIAGNOSIS — Y929 Unspecified place or not applicable: Secondary | ICD-10-CM | POA: Insufficient documentation

## 2013-04-30 DIAGNOSIS — T148XXA Other injury of unspecified body region, initial encounter: Secondary | ICD-10-CM

## 2013-04-30 MED ORDER — AMOXICILLIN-POT CLAVULANATE 875-125 MG PO TABS: 1.0000 | ORAL_TABLET | Freq: Two times a day (BID) | ORAL | Status: DC

## 2013-04-30 MED ORDER — TETANUS-DIPHTH-ACELL PERTUSSIS 5-2.5-18.5 LF-MCG/0.5 IM SUSP
0.5000 mL | Freq: Once | INTRAMUSCULAR | Status: AC
  Administered 2013-04-30: 0.5 mL via INTRAMUSCULAR
  Filled 2013-04-30: qty 0.5

## 2013-04-30 NOTE — ED Notes (Signed)
Back from x-ray right hand placed in betadine and NS soak.

## 2013-04-30 NOTE — Progress Notes (Signed)
Orthopedic Tech Progress Note Patient Details:  Derrick Hendrix [birth year] [id number]  Ortho Devices Type of Ortho Device: Finger splint Ortho Device/Splint Interventions: Application   Irish Elders 04/30/2013, 4:19 PM

## 2013-04-30 NOTE — ED Notes (Signed)
Patient transported to X-ray 

## 2013-04-30 NOTE — ED Provider Notes (Signed)
History    This chart was scribed for non-physician practitioner working with Derrick Essex, MD by Derrick Hendrix, ED scribe. This patient was seen in room TR05C/TR05C and the patient's care was started at 3:13 PM.   CSN: [id number]  Arrival date & time 04/30/13  1351      Chief Complaint  Patient presents with  . Hand Injury    The history is provided by the patient and medical records. No language interpreter was used.   HPI Comments: Derrick Hendrix is a 72 y.o. male who presents to the Emergency Department complaining of constant, moderate, throbbing right middle finger pain due to right middle finger injury today. Pt reports that he was gardening and hit his finger on a nail sticking out of pipe. Pt reports that the tip of the nail went through his right middle finger but he pulled the nail out. Pt denies fever, chills, nausea, vomiting, diarrhea, weakness, cough, SOB and any other pain. Pt states that his tetanus is not UTD.    Past Medical History  Diagnosis Date  . Hypertension     History reviewed. No pertinent past surgical history.  History reviewed. No pertinent family history.  History  Substance Use Topics  . Smoking status: Current Every Day Smoker  . Smokeless tobacco: Not on file  . Alcohol Use: Yes      Review of Systems  Constitutional: Negative for fever and chills.  Respiratory: Negative for cough and shortness of breath.   Gastrointestinal: Negative for nausea, vomiting and diarrhea.  Skin: Positive for wound (right middle finger).  Neurological: Negative for weakness.    Allergies  Review of patient's allergies indicates no known allergies.  Home Medications  No current outpatient prescriptions on file.  BP 168/89  Pulse 95  Temp(Src) 98.1 F (36.7 C) (Oral)  Resp 20  SpO2 98%  Physical Exam  Nursing note and vitals reviewed. Constitutional: He is oriented to person, place, and time. He appears well-developed and well-nourished. No  distress.  HENT:  Head: Normocephalic and atraumatic.  Right Ear: External ear normal.  Left Ear: External ear normal.  Nose: Nose normal.  Eyes: Conjunctivae are normal.  Neck: Normal range of motion. No tracheal deviation present.  Cardiovascular: Normal rate, regular rhythm and normal heart sounds.   Pulmonary/Chest: Effort normal and breath sounds normal. No stridor.  Abdominal: Soft. He exhibits no distension. There is no tenderness.  Musculoskeletal: Normal range of motion.  Right middle finger has puncture wound at distal tip and 1 cm laceration on palmar aspect of right middle finger. Flexion and extension at DIP and PIP are intact.  Bleeding controlled, neurovascularly intact   Neurological: He is alert and oriented to person, place, and time.  Skin: Skin is warm and dry. He is not diaphoretic.  Psychiatric: He has a normal mood and affect. His behavior is normal.    ED Course  Procedures (including critical care time) DIAGNOSTIC STUDIES: Oxygen Saturation is 98% on room air, normal by my interpretation.    COORDINATION OF CARE: 3:16 PM Discussed ED treatment with pt and pt agrees.  3:35 PM Irrigated and flushed the wound.     Labs Reviewed - No data to display Dg Finger Middle Right  04/30/2013   *RADIOLOGY REPORT*  Clinical Data: Recent traumatic injury.  RIGHT MIDDLE FINGER 2+V  Comparison: None.  Findings: No acute fracture or dislocation is noted.  There are multiple tiny soft tissue foreign bodies consistent with the given clinical history.  No other focal abnormality is noted.  IMPRESSION: Soft tissue change without acute bony abnormality.   Original Report Authenticated By: Inez Catalina, M.D.     1. Puncture wound       MDM  Patient is a 72 year old male who presents today with a puncture would with a rusty nail. No foreign bodies appreciated. XR shows soft tissue change without acute bony abnormality. Tdap given. Dr. Wyvonnia Hendrix evaluated this patient. Hand  surgeon consulted. Finger splint placed in ED. Augmentin given. Follow up with Dr. Grandville Hendrix on Monday. Return instructions given. Vital signs stable for discharge. Patient / Family / Caregiver informed of clinical course, understand medical decision-making process, and agree with plan.       I personally performed the services described in this documentation, which was scribed in my presence. The recorded information has been reviewed and is accurate.     Derrick Lade, PA-C 04/30/13 [phone]

## 2013-04-30 NOTE — ED Notes (Signed)
Pt reports injuring right middle finger, had nail go into his finger and out the finger tip at an angle. Puncture wounds noted, minimal bleeding.

## 2013-04-30 NOTE — ED Notes (Signed)
Noted voices no complaints at this time.

## 2013-05-01 NOTE — ED Provider Notes (Signed)
Medical screening examination/treatment/procedure(s) were conducted as a shared visit with non-physician practitioner(s) and myself.  I personally evaluated the patient during the encounter  Puncture wound to R 3rd digit from rusty nail. Volar middle phalanx and finger tip. DIP, PIP, MCP flexion intact. FDS, FDP intact. Sensation intacti. Clean wound, tetanus, antibiotics, splint. D/with Dr. Grandville Silos who agrees with management and will see on Monday.  [name], MD 05/01/13 [phone]

## 2014-06-26 ENCOUNTER — Other Ambulatory Visit: Payer: Self-pay

## 2014-06-26 ENCOUNTER — Encounter (HOSPITAL_COMMUNITY): Payer: Self-pay | Admitting: Emergency Medicine

## 2014-06-26 ENCOUNTER — Encounter (HOSPITAL_COMMUNITY)
Admission: EM | Disposition: A | Discharge status: Home / Self Care | Payer: Self-pay | Source: Home / Self Care | Attending: Internal Medicine

## 2014-06-26 ENCOUNTER — Inpatient Hospital Stay (HOSPITAL_COMMUNITY)
Admission: EM | Admit: 2014-06-26 | Discharge: 2014-06-29 | DRG: 682 | Disposition: A | Discharge status: Home / Self Care | Payer: Medicare Other | Attending: Internal Medicine | Admitting: Internal Medicine

## 2014-06-26 ENCOUNTER — Emergency Department (HOSPITAL_COMMUNITY): Payer: Medicare Other

## 2014-06-26 DIAGNOSIS — R630 Anorexia: Secondary | ICD-10-CM | POA: Diagnosis not present

## 2014-06-26 DIAGNOSIS — E875 Hyperkalemia: Secondary | ICD-10-CM | POA: Diagnosis not present

## 2014-06-26 DIAGNOSIS — D539 Nutritional anemia, unspecified: Secondary | ICD-10-CM | POA: Diagnosis present

## 2014-06-26 DIAGNOSIS — Z833 Family history of diabetes mellitus: Secondary | ICD-10-CM

## 2014-06-26 DIAGNOSIS — I208 Other forms of angina pectoris: Secondary | ICD-10-CM | POA: Diagnosis present

## 2014-06-26 DIAGNOSIS — F1010 Alcohol abuse, uncomplicated: Secondary | ICD-10-CM | POA: Diagnosis present

## 2014-06-26 DIAGNOSIS — N184 Chronic kidney disease, stage 4 (severe): Secondary | ICD-10-CM | POA: Diagnosis present

## 2014-06-26 DIAGNOSIS — R519 Headache, unspecified: Secondary | ICD-10-CM | POA: Diagnosis present

## 2014-06-26 DIAGNOSIS — N189 Chronic kidney disease, unspecified: Secondary | ICD-10-CM | POA: Diagnosis not present

## 2014-06-26 DIAGNOSIS — Z7289 Other problems related to lifestyle: Secondary | ICD-10-CM

## 2014-06-26 DIAGNOSIS — R55 Syncope and collapse: Secondary | ICD-10-CM

## 2014-06-26 DIAGNOSIS — R51 Headache: Secondary | ICD-10-CM | POA: Diagnosis not present

## 2014-06-26 DIAGNOSIS — N183 Chronic kidney disease, stage 3 unspecified: Secondary | ICD-10-CM

## 2014-06-26 DIAGNOSIS — K5900 Constipation, unspecified: Secondary | ICD-10-CM | POA: Diagnosis not present

## 2014-06-26 DIAGNOSIS — E538 Deficiency of other specified B group vitamins: Secondary | ICD-10-CM | POA: Diagnosis present

## 2014-06-26 DIAGNOSIS — X30XXXA Exposure to excessive natural heat, initial encounter: Secondary | ICD-10-CM | POA: Diagnosis present

## 2014-06-26 DIAGNOSIS — R079 Chest pain, unspecified: Secondary | ICD-10-CM | POA: Diagnosis not present

## 2014-06-26 DIAGNOSIS — Z23 Encounter for immunization: Secondary | ICD-10-CM

## 2014-06-26 DIAGNOSIS — R634 Abnormal weight loss: Secondary | ICD-10-CM | POA: Diagnosis present

## 2014-06-26 DIAGNOSIS — T675XXA Heat exhaustion, unspecified, initial encounter: Secondary | ICD-10-CM | POA: Diagnosis present

## 2014-06-26 DIAGNOSIS — N185 Chronic kidney disease, stage 5: Secondary | ICD-10-CM | POA: Diagnosis present

## 2014-06-26 DIAGNOSIS — Z681 Body mass index (BMI) 19 or less, adult: Secondary | ICD-10-CM

## 2014-06-26 DIAGNOSIS — I951 Orthostatic hypotension: Secondary | ICD-10-CM | POA: Diagnosis present

## 2014-06-26 DIAGNOSIS — D649 Anemia, unspecified: Secondary | ICD-10-CM | POA: Diagnosis not present

## 2014-06-26 DIAGNOSIS — N179 Acute kidney failure, unspecified: Secondary | ICD-10-CM | POA: Diagnosis not present

## 2014-06-26 DIAGNOSIS — E860 Dehydration: Secondary | ICD-10-CM | POA: Diagnosis present

## 2014-06-26 DIAGNOSIS — G44039 Episodic paroxysmal hemicrania, not intractable: Secondary | ICD-10-CM

## 2014-06-26 DIAGNOSIS — I739 Peripheral vascular disease, unspecified: Secondary | ICD-10-CM | POA: Diagnosis present

## 2014-06-26 DIAGNOSIS — IMO0002 Reserved for concepts with insufficient information to code with codable children: Secondary | ICD-10-CM

## 2014-06-26 DIAGNOSIS — R0789 Other chest pain: Secondary | ICD-10-CM

## 2014-06-26 DIAGNOSIS — Z789 Other specified health status: Secondary | ICD-10-CM | POA: Diagnosis not present

## 2014-06-26 DIAGNOSIS — R0609 Other forms of dyspnea: Secondary | ICD-10-CM | POA: Diagnosis present

## 2014-06-26 DIAGNOSIS — I2089 Other forms of angina pectoris: Secondary | ICD-10-CM | POA: Diagnosis present

## 2014-06-26 DIAGNOSIS — E43 Unspecified severe protein-calorie malnutrition: Secondary | ICD-10-CM | POA: Diagnosis present

## 2014-06-26 DIAGNOSIS — I70219 Atherosclerosis of native arteries of extremities with intermittent claudication, unspecified extremity: Secondary | ICD-10-CM

## 2014-06-26 DIAGNOSIS — Z8249 Family history of ischemic heart disease and other diseases of the circulatory system: Secondary | ICD-10-CM

## 2014-06-26 DIAGNOSIS — I10 Essential (primary) hypertension: Secondary | ICD-10-CM

## 2014-06-26 DIAGNOSIS — F17200 Nicotine dependence, unspecified, uncomplicated: Secondary | ICD-10-CM | POA: Diagnosis present

## 2014-06-26 DIAGNOSIS — F1090 Alcohol use, unspecified, uncomplicated: Secondary | ICD-10-CM | POA: Diagnosis not present

## 2014-06-26 DIAGNOSIS — I209 Angina pectoris, unspecified: Secondary | ICD-10-CM | POA: Diagnosis present

## 2014-06-26 DIAGNOSIS — I129 Hypertensive chronic kidney disease with stage 1 through stage 4 chronic kidney disease, or unspecified chronic kidney disease: Secondary | ICD-10-CM | POA: Diagnosis present

## 2014-06-26 HISTORY — DX: Headache, unspecified: R51.9

## 2014-06-26 LAB — TROPONIN I
Troponin I: <0.3 ng/mL (ref <0.30)
Troponin I: <0.3 ng/mL (ref <0.30)
Troponin I: <0.3 ng/mL (ref <0.30)

## 2014-06-26 LAB — URINALYSIS, ROUTINE W REFLEX MICROSCOPIC
Glucose, UA: NEGATIVE mg/dL
HGB URINE DIPSTICK: NEGATIVE
KETONES UR: NEGATIVE mg/dL
NITRITE: NEGATIVE
PROTEIN: 30 mg/dL — AB
Specific Gravity, Urine: 1.021 (ref 1.005–1.030)
UROBILINOGEN UA: 1 mg/dL (ref 0.0–1.0)
pH: 5 (ref 5.0–8.0)

## 2014-06-26 LAB — CBC
HCT: 37.5 % — ABNORMAL LOW (ref 39.0–52.0)
Hemoglobin: 12.3 g/dL — ABNORMAL LOW (ref 13.0–17.0)
MCH: 33.5 pg (ref 26.0–34.0)
MCHC: 32.8 g/dL (ref 30.0–36.0)
MCV: 102.2 fL — ABNORMAL HIGH (ref 78.0–100.0)
PLATELETS: 232 10*3/uL (ref 150–400)
RBC: 3.67 MIL/uL — ABNORMAL LOW (ref 4.22–5.81)
RDW: 15.6 % — AB (ref 11.5–15.5)
WBC: 6.9 10*3/uL (ref 4.0–10.5)

## 2014-06-26 LAB — RAPID URINE DRUG SCREEN, HOSP PERFORMED
Amphetamines: NOT DETECTED
BARBITURATES: NOT DETECTED
Benzodiazepines: NOT DETECTED
Cocaine: NOT DETECTED
Opiates: NOT DETECTED
Tetrahydrocannabinol: NOT DETECTED

## 2014-06-26 LAB — BASIC METABOLIC PANEL
ANION GAP: 16 — AB (ref 5–15)
BUN: 46 mg/dL — ABNORMAL HIGH (ref 6–23)
CHLORIDE: 108 meq/L (ref 96–112)
CO2: 19 mEq/L (ref 19–32)
CREATININE: 2.03 mg/dL — AB (ref 0.50–1.35)
Calcium: 9.4 mg/dL (ref 8.4–10.5)
GFR calc non Af Amer: 30 mL/min — ABNORMAL LOW (ref >90)
GFR, EST AFRICAN AMERICAN: 35 mL/min — AB (ref >90)
Glucose, Bld: 140 mg/dL — ABNORMAL HIGH (ref 70–99)
POTASSIUM: 5.3 meq/L (ref 3.7–5.3)
SODIUM: 143 meq/L (ref 137–147)

## 2014-06-26 LAB — I-STAT CHEM 8, ED
BUN: 43 mg/dL — ABNORMAL HIGH (ref 6–23)
CREATININE: 2.2 mg/dL — AB (ref 0.50–1.35)
Calcium, Ion: 1.24 mmol/L (ref 1.13–1.30)
Chloride: 113 mEq/L — ABNORMAL HIGH (ref 96–112)
Glucose, Bld: 139 mg/dL — ABNORMAL HIGH (ref 70–99)
HCT: 42 % (ref 39.0–52.0)
HEMOGLOBIN: 14.3 g/dL (ref 13.0–17.0)
Potassium: 5.1 mEq/L (ref 3.7–5.3)
SODIUM: 140 meq/L (ref 137–147)
TCO2: 18 mmol/L (ref 0–100)

## 2014-06-26 LAB — LIPID PANEL
CHOL/HDL RATIO: 2.2 ratio
Cholesterol: 142 mg/dL (ref 0–200)
HDL: 66 mg/dL (ref >39)
LDL CALC: 54 mg/dL (ref 0–99)
Triglycerides: 112 mg/dL (ref <150)
VLDL: 22 mg/dL (ref 0–40)

## 2014-06-26 LAB — CK: CK TOTAL: 119 U/L (ref 7–232)

## 2014-06-26 LAB — SODIUM, URINE, RANDOM: SODIUM UR: 87 meq/L

## 2014-06-26 LAB — CREATININE, URINE, RANDOM: Creatinine, Urine: 261.39 mg/dL

## 2014-06-26 LAB — HEMOGLOBIN A1C
Hgb A1c MFr Bld: 5 % (ref <5.7)
MEAN PLASMA GLUCOSE: 97 mg/dL (ref <117)

## 2014-06-26 LAB — I-STAT TROPONIN, ED: TROPONIN I, POC: 0.01 ng/mL (ref 0.00–0.08)

## 2014-06-26 LAB — URINE MICROSCOPIC-ADD ON

## 2014-06-26 LAB — VITAMIN B12: VITAMIN B 12: 253 pg/mL (ref 211–911)

## 2014-06-26 LAB — TSH: TSH: 0.872 u[IU]/mL (ref 0.350–4.500)

## 2014-06-26 SURGERY — LEFT HEART CATHETERIZATION WITH CORONARY ANGIOGRAM
Anesthesia: LOCAL

## 2014-06-26 MED ORDER — ASPIRIN 81 MG PO CHEW
324.0000 mg | CHEWABLE_TABLET | Freq: Once | ORAL | Status: AC
  Administered 2014-06-26: 324 mg via ORAL

## 2014-06-26 MED ORDER — SODIUM CHLORIDE 0.9 % IV BOLUS (SEPSIS)
1000.0000 mL | Freq: Once | INTRAVENOUS | Status: AC
  Administered 2014-06-26: 1000 mL via INTRAVENOUS

## 2014-06-26 MED ORDER — ATORVASTATIN CALCIUM 80 MG PO TABS
80.0000 mg | ORAL_TABLET | Freq: Every day | ORAL | Status: DC
  Administered 2014-06-26 – 2014-06-28 (×3): 80 mg via ORAL
  Filled 2014-06-26 (×4): qty 1

## 2014-06-26 MED ORDER — ONDANSETRON HCL 4 MG/2ML IJ SOLN: 4.0000 mg | Freq: Four times a day (QID) | INTRAMUSCULAR | Status: DC | PRN

## 2014-06-26 MED ORDER — CARVEDILOL 3.125 MG PO TABS
3.1250 mg | ORAL_TABLET | Freq: Two times a day (BID) | ORAL | Status: DC
  Filled 2014-06-26 (×2): qty 1

## 2014-06-26 MED ORDER — NITROGLYCERIN 0.4 MG SL SUBL: 0.4000 mg | SUBLINGUAL_TABLET | SUBLINGUAL | Status: DC | PRN

## 2014-06-26 MED ORDER — ASPIRIN 81 MG PO CHEW
CHEWABLE_TABLET | ORAL | Status: AC
  Administered 2014-06-26: 324 mg via ORAL
  Filled 2014-06-26: qty 4

## 2014-06-26 MED ORDER — ACETAMINOPHEN 325 MG PO TABS
650.0000 mg | ORAL_TABLET | ORAL | Status: DC | PRN
  Administered 2014-06-27: 650 mg via ORAL
  Filled 2014-06-26: qty 2

## 2014-06-26 MED ORDER — ASPIRIN EC 81 MG PO TBEC
81.0000 mg | DELAYED_RELEASE_TABLET | Freq: Every day | ORAL | Status: DC
  Administered 2014-06-27 – 2014-06-29 (×3): 81 mg via ORAL
  Filled 2014-06-26 (×4): qty 1

## 2014-06-26 MED ORDER — PNEUMOCOCCAL VAC POLYVALENT 25 MCG/0.5ML IJ INJ
0.5000 mL | INJECTION | INTRAMUSCULAR | Status: AC
  Administered 2014-06-27: 0.5 mL via INTRAMUSCULAR
  Filled 2014-06-26: qty 0.5

## 2014-06-26 MED ORDER — HEPARIN SODIUM (PORCINE) 5000 UNIT/ML IJ SOLN
5000.0000 [IU] | Freq: Three times a day (TID) | INTRAMUSCULAR | Status: DC
  Administered 2014-06-26 – 2014-06-28 (×6): 5000 [IU] via SUBCUTANEOUS
  Filled 2014-06-26 (×12): qty 1

## 2014-06-26 MED ORDER — SODIUM CHLORIDE 0.9 % IV SOLN
INTRAVENOUS | Status: DC
  Administered 2014-06-26 – 2014-06-29 (×6): via INTRAVENOUS

## 2014-06-26 NOTE — ED Notes (Signed)
Ashok Cordia, MD notified of abnormal lab test results

## 2014-06-26 NOTE — ED Notes (Signed)
Code STEMI cancelled by EDP and cardiologist.

## 2014-06-26 NOTE — ED Notes (Signed)
Attempted IV unable to third nurse will attempt.

## 2014-06-26 NOTE — Progress Notes (Signed)
Patient reports "slight, dull" headache rating 2/10, nurse offered PRN Tylenol.  Patient refused stating he did not want to take anything at this time.

## 2014-06-26 NOTE — ED Notes (Addendum)
Attempted IV restart twice, will have another RN attempt or call IV team.

## 2014-06-26 NOTE — ED Notes (Signed)
Cardiology at bedside.

## 2014-06-26 NOTE — ED Provider Notes (Signed)
CSN: [id number]     Arrival date & time 06/26/14  1121 History   First MD Initiated Contact with Patient 06/26/14 1145     Chief Complaint  Patient presents with  . Loss of Consciousness  . Dizziness  . Visual Field Change    right eye  . Chest Pain     (Consider location/radiation/quality/duration/timing/severity/associated sxs/prior Treatment) HPI 73 year old male with past medical history of hypertension presents with syncope and chest pain. Patient states that 5 days prior after we've biking in his yard his syncopal episode that lasted approximately 5 minutes. After this episode the patient reports going into his house and sleeping for 4 days straight and awaking yesterday. Patient is now endorsing chest pain with lightheadedness. Chest pain is described as pressure-like in nature, and moderate. After sitting in the room patient feels that the pain relieved. Patient denies associated shortness of breath nausea or vomiting.   Past Medical History  Diagnosis Date  . Hypertension    History reviewed. No pertinent past surgical history. No family history on file. History  Substance Use Topics  . Smoking status: Current Every Day Smoker  . Smokeless tobacco: Not on file  . Alcohol Use: Yes    Review of Systems    Allergies  Review of patient's allergies indicates no known allergies.  Home Medications   Prior to Admission medications   Not on File   BP 186/92  Pulse 89  Temp(Src) 98 F (36.7 C) (Oral)  Resp 18  SpO2 99% Physical Exam  ED Course  Procedures (including critical care time) Labs Review Labs Reviewed  CBC - Abnormal; Notable for the following:    RBC 3.67 (*)    Hemoglobin 12.3 (*)    HCT 37.5 (*)    MCV 102.2 (*)    RDW 15.6 (*)    All other components within normal limits  BASIC METABOLIC PANEL - Abnormal; Notable for the following:    Glucose, Bld 140 (*)    BUN 46 (*)    Creatinine, Ser 2.03 (*)    GFR calc non Af Amer 30 (*)    GFR calc  Af Amer 35 (*)    Anion gap 16 (*)    All other components within normal limits  I-STAT CHEM 8, ED - Abnormal; Notable for the following:    Chloride 113 (*)    BUN 43 (*)    Creatinine, Ser 2.20 (*)    Glucose, Bld 139 (*)    All other components within normal limits  TROPONIN I  URINE RAPID DRUG SCREEN (HOSP PERFORMED)  CK  SODIUM, URINE, RANDOM  CREATININE, URINE, RANDOM  TSH  TROPONIN I  TROPONIN I  HEMOGLOBIN A1C  LIPID PANEL  BASIC METABOLIC PANEL  Randolm Idol, ED    Imaging Review Dg Chest Port 1 View  06/26/2014   CLINICAL DATA:  Chest pain  EXAM: PORTABLE CHEST - 1 VIEW  COMPARISON:  02/27/2012  FINDINGS: Cardiac shadow is within normal limits. Bilateral nipple shadows are noted. The lungs are clear bilaterally. No acute bony abnormality is seen. An old left rib fracture is again noted.  IMPRESSION: No acute abnormality noted.   Electronically Signed   By: Inez Catalina M.D.   On: 06/26/2014 12:35     EKG Interpretation   Date/Time:  Monday June 26 2014 11:51:10 EDT Ventricular Rate:  87 PR Interval:  103 QRS Duration: 87 QT Interval:  341 QTC Calculation: 410 R Axis:   79 Text  Interpretation:  Sinus rhythm Short PR interval Left ventricular  hypertrophy Nonspecific ST abnormality `st elev inf and v3-v4 noted on  prior ecg from 2011 Confirmed by [facility]  MD, Lennette Bihari ([id number]) on 06/26/2014  12:02:46 PM      MDM   Final diagnoses:  Syncope, unspecified syncope type   73 year old male with past medical history of hypertension that presents after a syncopal episode and chest pain. Patient initially evaluated with EKG that showed changes concerning for ST elevation. Initially the Cath Lab was activated. A past EKG was able to be obtained that showed similar elevations in V1 2 and 3 it was determined at this time this likely represents early re-pole. EKG shows evidence of LVH. EKG showed a cardiology decrease with diagnosis. Aspirin given.   Patient further  evaluated and was found to have AK I on CMP. Troponin negative, CK negative a likely component of Rhabdo, patient given 1 L of normal saline this treatment. Chest x-ray no acute abnormality.  Patient admitted to Dr. Julious Oka with internal medicine for further evaluation of exertional syncope and chest pain. Patient is in agreement with this disposition plan.      Claudean Severance, MD 06/26/14 1752

## 2014-06-26 NOTE — ED Notes (Addendum)
Pt states that Saturday he became overheated and then went home and states he passed out and did not wake up till Sunday morning.  Pt states he lost vision in right eye for and states dizziness.  Did have chest pain yesterday for a little while, no pain now

## 2014-06-26 NOTE — H&P (Signed)
Date: 06/26/2014               Patient Name:  Derrick Hendrix MRN: [id number]  DOB: May 02, 1941 Age / Sex: 73 y.o., male   PCP: Provider Default, MD         Medical Service: Internal Medicine Teaching Service         Attending Physician: Dr. Karren Cobble, MD    First Contact: Dr. Hulen Luster Pager: [phone]  Second Contact: Dr. Eula Fried Pager: [phone]       After Hours (After 5p/  First Contact Pager: [phone]  weekends / holidays): Second Contact Pager: [phone]   Chief Complaint: HA, chest pain on exertion, unsteadiness  History of Present Illness: Derrick Hendrix is a 73 y/o male w/ PMH of HTN, EtOH abuse, CKD, and macrocytic anemia who presents to the ED after waking up yesterday with a headache. Pt states that he was doing yardwork on Saturday around 6:00pm. He then felt overheated and dizzy and went into the house and laid down. He then woke up the next morning with a sharp HA that he rated as 5/10, nausea, blurry vision in his rt eye, and chest pain that he describes as a bloated feeling. All symptoms resolved within 1-2 hours. Pt had some confusion when he woke up, denied any incontinence. Pt also complains of DOE and chest pain when walking short distances stating he feels like he is about to pass out, which has been present for a few years. Pt lay flat at night and has to sleep w/ 3 pillows.  Pt reports 45lbs weight loss w/in 3-4 months due to lack of appetite, lack of teeth, and early satiety.  Pt drinks 3 cans of beer a few days a week and smokes 10-12 ciggs/day for the past 45 years.   Meds: Current Facility-Administered Medications  Medication Dose Route Frequency Provider Last Rate Last Dose  . 0.9 %  sodium chloride infusion   Intravenous Continuous Jerene Pitch, MD      . acetaminophen (TYLENOL) tablet 650 mg  650 mg Oral Q4H PRN Jerene Pitch, MD      . Derrill Memo ON 06/27/2014] aspirin EC tablet 81 mg  81 mg Oral Daily Jerene Pitch, MD      . atorvastatin (LIPITOR) tablet 80 mg   80 mg Oral q1800 Jerene Pitch, MD      . heparin injection 5,000 Units  5,000 Units Subcutaneous 3 times per day Jerene Pitch, MD      . nitroGLYCERIN (NITROSTAT) SL tablet 0.4 mg  0.4 mg Sublingual Q5 Min x 3 PRN Jerene Pitch, MD      . ondansetron (ZOFRAN) injection 4 mg  4 mg Intravenous Q6H PRN Jerene Pitch, MD        Allergies: Allergies as of 06/26/2014  . (No Known Allergies)   Past Medical History  Diagnosis Date  . Hypertension    History reviewed. No pertinent past surgical history. Family History  Problem Relation Age of Onset  . Hypertension Mother   . Hypertension Father   . Diabetes Mother    History   Social History  . Marital Status: Single    Spouse Name: N/A    Number of Children: N/A  . Years of Education: N/A   Occupational History  . Not on file.   Social History Main Topics  . Smoking status: Current Every Day Smoker -- 0.50 packs/day for 40 years    Types: Cigarettes  . Smokeless tobacco: Not on file  Comment: but hoping to quit one day  . Alcohol Use: Yes     Comment: beer  . Drug Use: No  . Sexual Activity: Not on file   Other Topics Concern  . Not on file   Social History Narrative   Works in the yard          Review of Systems: Constitutional: positive for chills and weight loss Eyes: positive for blurry vision in rt eye Respiratory: positive for dyspnea on exertion Cardiovascular: positive for chest pain, exertional chest pressure/discomfort and paroxysmal nocturnal dyspnea Gastrointestinal: positive for nausea, denies diarrhea or bloody stools Musculoskeletal:positive for lower extremity cramping Neuro: lightheadedness, rt arm numbness starting at pinky extending up lateral forearm  Physical Exam: Blood pressure 165/89, pulse 77, temperature 97.5 F (36.4 C), temperature source Oral, resp. rate 18, height 6\' 2"  (1.88 m), weight 138 lb 12.8 oz (62.959 kg), SpO2 99.00%. General appearance: alert, cooperative and no  distress Head: Normocephalic, without obvious abnormality, atraumatic Eyes: conjunctivae/corneas clear. PERRL, EOM's intact. Fundi benign. Throat: poor dental hygeine, missing all upper palate teeth, uvula nondeviated Lungs: clear to auscultation bilaterally Heart: regular rate and rhythm, S1, S2 normal, no murmur, click, rub or gallop Abdomen: soft, non-tender; bowel sounds normal; no masses,  no organomegaly Pulses: 2+ and symmetric Neuro: CN 2-16 intact, unsteady rombergs sign, decreased sensation on lt side of body vs rt  Lab results: Basic Metabolic Panel:  Recent Labs  06/26/14 0013 06/26/14 1153  NA 143 140  K 5.3 5.1  CL 108 113*  CO2 19  --   GLUCOSE 140* 139*  BUN 46* 43*  CREATININE 2.03* 2.20*  CALCIUM 9.4  --    CBC:  Recent Labs  06/26/14 0013 06/26/14 1153  WBC 6.9  --   HGB 12.3* 14.3  HCT 37.5* 42.0  MCV 102.2*  --   PLT 232  --    Cardiac Enzymes:  Recent Labs  06/26/14 1146  CKTOTAL 119  TROPONINI <0.30   Urine Drug Screen: Drugs of Abuse     Component Value Date/Time   LABOPIA NONE DETECTED 06/26/2014 1552   COCAINSCRNUR NONE DETECTED 06/26/2014 1552   LABBENZ NONE DETECTED 06/26/2014 1552   AMPHETMU NONE DETECTED 06/26/2014 1552   THCU NONE DETECTED 06/26/2014 1552   LABBARB NONE DETECTED 06/26/2014 1552      Imaging results:  Dg Chest Port 1 View  06/26/2014   CLINICAL DATA:  Chest pain  EXAM: PORTABLE CHEST - 1 VIEW  COMPARISON:  02/27/2012  FINDINGS: Cardiac shadow is within normal limits. Bilateral nipple shadows are noted. The lungs are clear bilaterally. No acute bony abnormality is seen. An old left rib fracture is again noted.  IMPRESSION: No acute abnormality noted.   Electronically Signed   By: Inez Catalina M.D.   On: 06/26/2014 12:35    Other results: EKG: sinus rhythm w/ ST elevation II, III, V3-V5 unchanged from 2011 EKG  Assessment & Plan by Problem: Principal Problem:   Stable angina Active Problems:   Acute renal  failure superimposed on stage 3 chronic kidney disease   Headache   Essential hypertension, benign  Dehydration - likely 2/2 to inadequate hydration and presumed heat exhaustion, also w/ ?EtOH use as pt has hx of EtOH abuse. Pt last ate Saturday morning reports recent 45lbs weight loss w/in 3 months due to lack of appetite and lack of teeth. Also pt reports early satiety. Pt had positive orthostatic vital signs- 185/85, HR 71 lying down and  136/76, HR 86 standing up. BUN 43 and Cr 2.20 - IVF at 200cc/hr - recheck BMET tomorrow - FENa: 0.52 indicating pre-renal cause of elevated BUN and Cr on admission - UA  Stable angina. Pt complains of chest pain on exertion that goes away with rest. Negative CXR. Pt also having orthopnea. - troponins x 3 - repeat EKG - consider combined stress test and ECHO, will speak w/ cardiology, otherwise ordered ECHO - asp 81, pt loaded w/ 325mg  in the ED - will start low dose statin - will not start BB at this time due to dehydration and orthostatic hypotension but will add low dose coreg when appropriate - lipid panel - A1c -TSH  HTN-  - will hold off on starting HTN meds at this time as pt is dehydrated w/ orthostatic hypotension. Pt has previously been on norvasc 5mg  w/ rapid improvement of BPs. However thiazides would be the most beneficial for this patient.   Muscle cramping - although possibly due to dehydration will check ABIs in case of claudication.   Lightheadedness- Pt complains of feeling lightheaded and waking up yesterday afternoon with a 5/10 headache. Possibly due to elevated BPs. On PE pt had decreased sensation on lt side of body although he reports having rt arm numbness. Also was unsteady during romberg test. Pt may have had a stroke in the past w/ elevated BPs as the inciting event with resolution. Held off on CT head as would not change management.  -  Fall precautions  Acute on chronic CKD- 2/2 pre-renal cause likely dehydration - GFR  02/2012 was 48, admission GFR 35  Macrocytic anemia - pt has hx of EtOH abuse - will check B12 and folate  Diet - soft food diet  DVT - heparin Nunez    Dispo: Disposition is deferred at this time, awaiting improvement of current medical problems.   The patient does not have a current PCP (Provider Default, MD) and does need an [facility] hospital follow-up appointment after discharge.   Signed: Julious Oka, MD 06/26/2014, 6:52 PM

## 2014-06-26 NOTE — ED Notes (Signed)
EKG completed in Triage

## 2014-06-27 ENCOUNTER — Observation Stay (HOSPITAL_COMMUNITY): Payer: Medicare Other

## 2014-06-27 DIAGNOSIS — R634 Abnormal weight loss: Secondary | ICD-10-CM | POA: Diagnosis present

## 2014-06-27 DIAGNOSIS — I70219 Atherosclerosis of native arteries of extremities with intermittent claudication, unspecified extremity: Secondary | ICD-10-CM | POA: Diagnosis not present

## 2014-06-27 DIAGNOSIS — Z789 Other specified health status: Secondary | ICD-10-CM | POA: Diagnosis not present

## 2014-06-27 DIAGNOSIS — N179 Acute kidney failure, unspecified: Secondary | ICD-10-CM | POA: Diagnosis not present

## 2014-06-27 DIAGNOSIS — I129 Hypertensive chronic kidney disease with stage 1 through stage 4 chronic kidney disease, or unspecified chronic kidney disease: Secondary | ICD-10-CM | POA: Diagnosis present

## 2014-06-27 DIAGNOSIS — K5900 Constipation, unspecified: Secondary | ICD-10-CM | POA: Diagnosis not present

## 2014-06-27 DIAGNOSIS — I2089 Other forms of angina pectoris: Secondary | ICD-10-CM

## 2014-06-27 DIAGNOSIS — Z833 Family history of diabetes mellitus: Secondary | ICD-10-CM | POA: Diagnosis not present

## 2014-06-27 DIAGNOSIS — F17200 Nicotine dependence, unspecified, uncomplicated: Secondary | ICD-10-CM | POA: Diagnosis present

## 2014-06-27 DIAGNOSIS — R0609 Other forms of dyspnea: Secondary | ICD-10-CM | POA: Diagnosis not present

## 2014-06-27 DIAGNOSIS — R55 Syncope and collapse: Secondary | ICD-10-CM | POA: Diagnosis not present

## 2014-06-27 DIAGNOSIS — R0789 Other chest pain: Secondary | ICD-10-CM | POA: Diagnosis not present

## 2014-06-27 DIAGNOSIS — I209 Angina pectoris, unspecified: Secondary | ICD-10-CM | POA: Diagnosis not present

## 2014-06-27 DIAGNOSIS — I208 Other forms of angina pectoris: Secondary | ICD-10-CM | POA: Diagnosis present

## 2014-06-27 DIAGNOSIS — E875 Hyperkalemia: Secondary | ICD-10-CM | POA: Diagnosis not present

## 2014-06-27 DIAGNOSIS — Z23 Encounter for immunization: Secondary | ICD-10-CM | POA: Diagnosis not present

## 2014-06-27 DIAGNOSIS — E538 Deficiency of other specified B group vitamins: Secondary | ICD-10-CM | POA: Diagnosis present

## 2014-06-27 DIAGNOSIS — E860 Dehydration: Secondary | ICD-10-CM | POA: Diagnosis not present

## 2014-06-27 DIAGNOSIS — Z681 Body mass index (BMI) 19 or less, adult: Secondary | ICD-10-CM | POA: Diagnosis not present

## 2014-06-27 DIAGNOSIS — N189 Chronic kidney disease, unspecified: Secondary | ICD-10-CM | POA: Diagnosis not present

## 2014-06-27 DIAGNOSIS — R918 Other nonspecific abnormal finding of lung field: Secondary | ICD-10-CM | POA: Diagnosis not present

## 2014-06-27 DIAGNOSIS — R51 Headache: Secondary | ICD-10-CM | POA: Diagnosis present

## 2014-06-27 DIAGNOSIS — R0989 Other specified symptoms and signs involving the circulatory and respiratory systems: Secondary | ICD-10-CM

## 2014-06-27 DIAGNOSIS — R072 Precordial pain: Secondary | ICD-10-CM

## 2014-06-27 DIAGNOSIS — R630 Anorexia: Secondary | ICD-10-CM | POA: Diagnosis not present

## 2014-06-27 DIAGNOSIS — D539 Nutritional anemia, unspecified: Secondary | ICD-10-CM | POA: Diagnosis present

## 2014-06-27 DIAGNOSIS — Z8249 Family history of ischemic heart disease and other diseases of the circulatory system: Secondary | ICD-10-CM | POA: Diagnosis not present

## 2014-06-27 DIAGNOSIS — R079 Chest pain, unspecified: Secondary | ICD-10-CM | POA: Diagnosis not present

## 2014-06-27 DIAGNOSIS — E43 Unspecified severe protein-calorie malnutrition: Secondary | ICD-10-CM | POA: Diagnosis not present

## 2014-06-27 DIAGNOSIS — I951 Orthostatic hypotension: Secondary | ICD-10-CM | POA: Diagnosis not present

## 2014-06-27 DIAGNOSIS — G44039 Episodic paroxysmal hemicrania, not intractable: Secondary | ICD-10-CM

## 2014-06-27 DIAGNOSIS — I10 Essential (primary) hypertension: Secondary | ICD-10-CM

## 2014-06-27 DIAGNOSIS — X30XXXA Exposure to excessive natural heat, initial encounter: Secondary | ICD-10-CM | POA: Diagnosis present

## 2014-06-27 DIAGNOSIS — F1010 Alcohol abuse, uncomplicated: Secondary | ICD-10-CM | POA: Diagnosis present

## 2014-06-27 DIAGNOSIS — IMO0002 Reserved for concepts with insufficient information to code with codable children: Secondary | ICD-10-CM | POA: Diagnosis not present

## 2014-06-27 DIAGNOSIS — T675XXA Heat exhaustion, unspecified, initial encounter: Secondary | ICD-10-CM | POA: Diagnosis present

## 2014-06-27 DIAGNOSIS — R42 Dizziness and giddiness: Secondary | ICD-10-CM

## 2014-06-27 DIAGNOSIS — N183 Chronic kidney disease, stage 3 unspecified: Secondary | ICD-10-CM | POA: Diagnosis present

## 2014-06-27 DIAGNOSIS — Z7289 Other problems related to lifestyle: Secondary | ICD-10-CM | POA: Diagnosis not present

## 2014-06-27 HISTORY — DX: Other forms of angina pectoris: I20.89

## 2014-06-27 HISTORY — DX: Deficiency of other specified B group vitamins: E53.8

## 2014-06-27 HISTORY — DX: Other forms of angina pectoris: I20.8

## 2014-06-27 HISTORY — DX: Abnormal weight loss: R63.4

## 2014-06-27 LAB — BASIC METABOLIC PANEL
ANION GAP: 15 (ref 5–15)
BUN: 47 mg/dL — ABNORMAL HIGH (ref 6–23)
CALCIUM: 8.3 mg/dL — AB (ref 8.4–10.5)
CO2: 18 mEq/L — ABNORMAL LOW (ref 19–32)
Chloride: 107 mEq/L (ref 96–112)
Creatinine, Ser: 1.83 mg/dL — ABNORMAL HIGH (ref 0.50–1.35)
GFR, EST AFRICAN AMERICAN: 40 mL/min — AB (ref >90)
GFR, EST NON AFRICAN AMERICAN: 34 mL/min — AB (ref >90)
Glucose, Bld: 82 mg/dL (ref 70–99)
Potassium: 4.8 mEq/L (ref 3.7–5.3)
SODIUM: 140 meq/L (ref 137–147)

## 2014-06-27 LAB — FOLATE RBC: RBC FOLATE: 328 ng/mL (ref >280)

## 2014-06-27 MED ORDER — NICOTINE 14 MG/24HR TD PT24
14.0000 mg | MEDICATED_PATCH | Freq: Every day | TRANSDERMAL | Status: DC
  Administered 2014-06-27 – 2014-06-29 (×3): 14 mg via TRANSDERMAL
  Filled 2014-06-27 (×3): qty 1

## 2014-06-27 MED ORDER — CARVEDILOL 3.125 MG PO TABS
3.1250 mg | ORAL_TABLET | Freq: Two times a day (BID) | ORAL | Status: DC
  Administered 2014-06-28 – 2014-06-29 (×3): 3.125 mg via ORAL
  Filled 2014-06-27 (×5): qty 1

## 2014-06-27 MED ORDER — CARVEDILOL 3.125 MG PO TABS: 3.1250 mg | ORAL_TABLET | Freq: Two times a day (BID) | ORAL | Status: DC

## 2014-06-27 MED ORDER — CYANOCOBALAMIN 1000 MCG/ML IJ SOLN
1000.0000 ug | Freq: Once | INTRAMUSCULAR | Status: AC
  Administered 2014-06-28: 1000 ug via INTRAMUSCULAR
  Filled 2014-06-27: qty 1

## 2014-06-27 MED ORDER — ENSURE COMPLETE PO LIQD
237.0000 mL | Freq: Three times a day (TID) | ORAL | Status: DC
  Administered 2014-06-27 – 2014-06-29 (×5): 237 mL via ORAL

## 2014-06-27 NOTE — Progress Notes (Signed)
Internal Medicine Attending Admission Note Date: 06/27/2014  Patient name: Derrick Hendrix Medical record number: [id number] Date of birth: Mar 30, 1941 Age: 73 y.o. Gender: male  I saw and evaluated the patient. I reviewed the resident's note and I agree with the resident's findings and plan as documented in the resident's note.  Chief Complaint(s): Chronic headache, unintentional weight loss, dizziness, and exertional chest pain.  History - key components related to admission:  Mr. Vegter is a 73 year old man with a history of hypertension, stage III chronic kidney disease, and alcohol abuse who presents for evaluation of chronic headaches, a 35-40 pound weight loss over the last 3 months, dizziness over the last 2 days, and several months of exertional chest pain. He does not have a regular primary care physician to manage his chronic medical issues. With regards to the weight loss he notes very poor dentition and the need to cut his food into very small pieces in order to swallow it. That being said, he also notes a recent decrease in his appetite. He denies any fevers, shakes, chills but does have an occasional night sweat. He has chronic constipation and states there has been no change in his bowel habits recently. His dizziness began 2 days prior to admission when he was out doing yard work in the hot sun. He felt very tired, went into the house, and slept for several hours. Upon awakening and standing he became dizzy and unsteady. His chest pain is noted be exertional, substernal, and a fullness. Occasionally the pain will be relieved with exertion as well as with rest. Cardiac risk factors include hypertension, chronic kidney disease, and tobacco abuse. He was admitted to the internal medicine teaching service for further evaluation.  Physical Exam - key components related to admission:  Filed Vitals:   06/27/14 0455 06/27/14 0500 06/27/14 0838 06/27/14 1415  BP:  149/77  132/66  Pulse:  79  80   Temp:  97.7 F (36.5 C) 98.1 F (36.7 C) 98.2 F (36.8 C)  TempSrc:  Oral Oral Oral  Resp:  18 18 18   Height:      Weight: 144 lb 2.9 oz (65.4 kg) 144 lb 2.9 oz (65.4 kg)    SpO2:  98% 100%    General: Well-developed, thin man lying comfortably in bed in no acute distress. HEENT: Poor dentition Neuro: 4+/5 strength in the right lower extremity with 5/5 strength elsewhere. Gait is somewhat wide-based and unsteady but his cerebellar function on testing seems intact.  Lab results:  Basic Metabolic Panel:  Recent Labs  06/26/14 0013 06/26/14 1153 06/27/14  NA 143 140 140  K 5.3 5.1 4.8  CL 108 113* 107  CO2 19  --  18*  GLUCOSE 140* 139* 82  BUN 46* 43* 47*  CREATININE 2.03* 2.20* 1.83*  CALCIUM 9.4  --  8.3*   CBC:  Recent Labs  06/26/14 0013 06/26/14 1153  WBC 6.9  --   HGB 12.3* 14.3  HCT 37.5* 42.0  MCV 102.2*  --   PLT 232  --    Cardiac Enzymes:  Recent Labs  06/26/14 1146 06/26/14 1815 06/26/14 2310  CKTOTAL 119  --   --   TROPONINI <0.30 <0.30 <0.30   Hemoglobin A1C:  Recent Labs  06/26/14 1815  HGBA1C 5.0   Fasting Lipid Panel:  Recent Labs  06/26/14 1815  CHOL 142  HDL 66  LDLCALC 54  TRIG 112  CHOLHDL 2.2   Thyroid Function Tests:  Recent Labs  06/26/14 1815  TSH 0.872   Anemia Panel:  Recent Labs  06/26/14 1815  VITAMINB12 253   Urine Drug Screen:  Negative  Urinalysis:  Urine bilirubin small, protein 30, nitrite negative, leukocytes small, 7-10 white blood cells per high-power field.  Misc. Labs:  Red blood cell folate 328  Urine sodium 87  Fractional excretion of sodium 0.52  Imaging results:  Dg Chest 2 View  06/27/2014   CLINICAL DATA:  Forty top L weight loss, headache  EXAM: CHEST  2 VIEW  COMPARISON:  Chest x-ray of 06/26/2014  FINDINGS: The lungs are clear and slightly hyperaerated. Probable symmetrical nipple shadows are noted at both lung bases. Mediastinal and hilar contours are unremarkable.  The heart is within normal limits in size. No bony abnormality is seen.  IMPRESSION: Slight hyper aeration. No active lung disease. Probable nipple shadows symmetrically at the lung bases.   Electronically Signed   By: Ivar Drape M.D.   On: 06/27/2014 09:24   Dg Chest Port 1 View  06/26/2014   CLINICAL DATA:  Chest pain  EXAM: PORTABLE CHEST - 1 VIEW  COMPARISON:  02/27/2012  FINDINGS: Cardiac shadow is within normal limits. Bilateral nipple shadows are noted. The lungs are clear bilaterally. No acute bony abnormality is seen. An old left rib fracture is again noted.  IMPRESSION: No acute abnormality noted.   Electronically Signed   By: Inez Catalina M.D.   On: 06/26/2014 12:35   Other results:  EKG: Normal sinus rhythm at 77 beats per minute, normal axis, normal intervals, no LVH, no significant Q waves, ST elevation in II, III, aVF, V3-V6 that could be consistent with J-point elevation. These ST abnormalities are unchanged from a previous ECG in 2013.  Assessment & Plan by Problem:  Mr. Gramza is a 73 year old man with hypertension, stage 3 chronic kidney disease, tobacco and alcohol abuse, who presents with several complaints. The cause of the chronic headaches are unclear at this point and his neurologic exam is slightly abnormal with very minimal weakness in the right lower extremity. This seems to be chronic. His weight loss is concerning and he requires the appropriate cancer screening. This can be done as an outpatient, particularly a screening colonoscopy. I am also concerned that his weight loss is related to his poor dentition and inability to eat properly. We will provide him with some dental resources that he may be able to tap into as an outpatient to work on this. His dizziness is likely related to dehydration from being out in the sun. He was found to be orthostatic upon admission and this should respond to fluid resuscitation. Finally, his chest pain is concerning given his risk factors  and ECG changes. This will require further evaluation. Ultimately, what Mr. Mom requires, is to establish a relationship with her primary care provider so that his chronic medical conditions can be appropriately managed.  1) Chest pain: He will undergo a Myoview to look for ischemia as a potential cause of the chest pain and EKG changes. I appreciate cardiology's recommendations. Now that he has been volume resuscitated we will start beta blockade pending further evaluation. Depending on how this is tolerated he may require the addition of other agents for his presumed angina and hypertension.  2) Unintentional weight loss: In the setting of poor dentition he does require a dental evaluation. He will be provided with a list of resources locally that he may be able to tap into. Also as an outpatient, we  will perform the age-appropriate cancer screening, in his case this would be mainly a colonoscopy.  3) Unsteadiness: This is likely related to dehydration and is highlighted by a significant orthostasis upon admission. He has been aggressively hydrated with improvement in his symptoms. It should be noted, that his B12 level is on the low end of normal and his gait was a bit wide-based. I am concerned he may have early B12 deficiency and would benefit from vitamin B12 supplementation. Therefore we will give him a B12 IM shot while here.  4) Disposition: Mr. Shurn will be set up in the internal medicine primary care center for ongoing medical care as an outpatient once the inpatient evaluation has been completed.

## 2014-06-27 NOTE — Progress Notes (Signed)
Subjective: Overnight, patient slept well. He was sitting up in his bed and talking when during our visit. Still complains of mild headache, which he rated as 6/10--stating this felt slightly more intense than what he felt Sunday. He said he believes this may be due to lack of caffeine as he is a heavy coffee drinker. He reports some dizziness on standing. Otherwise, no new complaints or concerns.   Objective: Vital signs in last 24 hours: Filed Vitals:   06/27/14 0455 06/27/14 0500 06/27/14 0838 06/27/14 1415  BP:  149/77  132/66  Pulse:  79  80  Temp:  97.7 F (36.5 C) 98.1 F (36.7 C) 98.2 F (36.8 C)  TempSrc:  Oral Oral Oral  Resp:  18 18 18   Height:      Weight: 65.4 kg (144 lb 2.9 oz) 65.4 kg (144 lb 2.9 oz)    SpO2:  98% 100%     Intake/Output Summary (Last 24 hours) at 06/27/14 1452 Last data filed at 06/27/14 1317  Gross per 24 hour  Intake 3173.98 ml  Output    650 ml  Net 2523.98 ml    Physical Exam:  General: well appearing, in no acute distress. Alert and oriented X3.  Head: no evidence of trauma Heart: RRR, S1 and S2 with no s3 or s4, no murmurs, rubs or gallops Lungs: No increase work of breathing, lungs clear to auscultation, no wheezes or crackles Abdomen: Non-distended. Normoactive bowel sounds. No tenderness to palpation.  Neuro:  CN II-XII intact;  Motor 5/5 in all extremities. Finger-nose testing normal. Some decreased sensation on the right when compared to left. Gait: wide-based and unsteady.  Skin: normal texture, normal turgor, warm, dry, no rash  Lab Results: Basic Metabolic Panel:  Recent Labs Lab 06/26/14 0013 06/26/14 1153 06/27/14  NA 143 140 140  K 5.3 5.1 4.8  CL 108 113* 107  CO2 19  --  18*  GLUCOSE 140* 139* 82  BUN 46* 43* 47*  CREATININE 2.03* 2.20* 1.83*  CALCIUM 9.4  --  8.3*   CBC:  Recent Labs Lab 06/26/14 0013 06/26/14 1153  WBC 6.9  --   HGB 12.3* 14.3  HCT 37.5* 42.0  MCV 102.2*  --   PLT 232  --    Cardiac  Enzymes:  Recent Labs Lab 06/26/14 1146 06/26/14 1815 06/26/14 2310  CKTOTAL 119  --   --   TROPONINI <0.30 <0.30 <0.30   Hemoglobin A1C:  Recent Labs Lab 06/26/14 1815  HGBA1C 5.0   Fasting Lipid Panel:  Recent Labs Lab 06/26/14 1815  CHOL 142  HDL 66  LDLCALC 54  TRIG 112  CHOLHDL 2.2   Thyroid Function Tests:  Recent Labs Lab 06/26/14 1815  TSH 0.872   Anemia Panel:  Recent Labs Lab 06/26/14 1815  VITAMINB12 253   Urine Drug Screen: Drugs of Abuse     Component Value Date/Time   LABOPIA NONE DETECTED 06/26/2014 1552   COCAINSCRNUR NONE DETECTED 06/26/2014 1552   LABBENZ NONE DETECTED 06/26/2014 1552   AMPHETMU NONE DETECTED 06/26/2014 1552   THCU NONE DETECTED 06/26/2014 1552   LABBARB NONE DETECTED 06/26/2014 1552    Urinalysis:  Recent Labs Lab 06/26/14 1552  COLORURINE YELLOW  LABSPEC 1.021  PHURINE 5.0  GLUCOSEU NEGATIVE  HGBUR NEGATIVE  BILIRUBINUR SMALL*  KETONESUR NEGATIVE  PROTEINUR 30*  UROBILINOGEN 1.0  NITRITE NEGATIVE  LEUKOCYTESUR SMALL*   Studies/Results: Dg Chest 2 View  06/27/2014   CLINICAL DATA:  Forty  top L weight loss, headache  EXAM: CHEST  2 VIEW  COMPARISON:  Chest x-ray of 06/26/2014  FINDINGS: The lungs are clear and slightly hyperaerated. Probable symmetrical nipple shadows are noted at both lung bases. Mediastinal and hilar contours are unremarkable. The heart is within normal limits in size. No bony abnormality is seen.  IMPRESSION: Slight hyper aeration. No active lung disease. Probable nipple shadows symmetrically at the lung bases.   Electronically Signed   By: Ivar Drape M.D.   On: 06/27/2014 09:24   Dg Chest Port 1 View  06/26/2014   CLINICAL DATA:  Chest pain  EXAM: PORTABLE CHEST - 1 VIEW  COMPARISON:  02/27/2012  FINDINGS: Cardiac shadow is within normal limits. Bilateral nipple shadows are noted. The lungs are clear bilaterally. No acute bony abnormality is seen. An old left rib fracture is again noted.   IMPRESSION: No acute abnormality noted.   Electronically Signed   By: Inez Catalina M.D.   On: 06/26/2014 12:35   Medications: I have reviewed the patient's current medications. Scheduled Meds: . aspirin EC  81 mg Oral Daily  . atorvastatin  80 mg Oral q1800  . feeding supplement (ENSURE COMPLETE)  237 mL Oral TID BM  . heparin  5,000 Units Subcutaneous 3 times per day  . nicotine  14 mg Transdermal Daily  . pneumococcal 23 valent vaccine  0.5 mL Intramuscular Tomorrow-1000   Continuous Infusions: . sodium chloride 100 mL/hr at 06/27/14 1145   PRN Meds:.acetaminophen, nitroGLYCERIN, ondansetron (ZOFRAN) IV  Assessment/Plan: Principal Problem:   Chest pain Active Problems:   Acute renal failure superimposed on stage 3 chronic kidney disease   Headache   Essential hypertension, benign   Weight loss, unintentional   Current smoker   Alcohol use   Orthostatic hypotension   DOE (dyspnea on exertion)   Stable angina  This is hospital day #2 for Mr. Capito, a 73 year-old with a PMH of hypertension, alcohol abuse, chronic kidney disease, and macrocytic anemia who presented to the ED after waking up on Sunday with a headache. The day before he had been working in his yard until he started to feel dizzy, he went inside to lay down and didn't wake up until the next morning. This seemed odd to him as he typically doesn't sleep that long. He reported a sharp headached that he rated a 5/10, nausea, blurry vision in his right eye, and chest pain that he described as a "bloated" feeling. All of these symptoms resolved after about 1-2 hours. Of note, he also reported some dyspnea on exertion and chest pain, as well as frequent leg cramps that occur when walking short distances, that has been going on for about 3 years.   Dehydration: Most likely secondary to inadequate hydration and heat exhaustion. Patient was initially placed on 200 cc IV fluids. BUN and creatinine now 47 and 1.83 currently.  --will  continue fluids at 100 cc/hour --Will check BMET in the morning.  Stable Angina: patient mentioned dyspnea on exertion as well as angina that resolves with rest.  --patient was placed on ASA 81 mg -- Will be placed on low-dose statin --Beta blocker will be started once dehydration and orthostatic hypertension are resolved. Considering low-dose Coreg once resolved.  -- cardiology consulted about stress test. Will consider ECHO.   Acute on chronic kidney disease: Again, most likely secondary to dehydration. Will continue to monitor BMET and hydrate patient. Will reassess in the morning.   Lightheadedness: some unsteadiness noted  on standing, and with movement. Most likely due to dehydration, though previous stroke has also been considered. Patient has been placed on fall precautions.    Hypertension: again, blood pressure medications withheld pending improvement of dehydration and   This is a Careers information officer Note.  The care of the patient was discussed with Dr. Hulen Luster and the assessment and plan formulated with their assistance.  Please see their attached note for official documentation of the daily encounter.   LOS: 1 day   Gar Gibbon, Med Student 06/27/2014, 2:52 PM

## 2014-06-27 NOTE — Progress Notes (Signed)
Subjective: Pt complaining of a mild headache. Still feeling dizzy when ambulating.   Objective: Vital signs in last 24 hours: Filed Vitals:   06/26/14 2136 06/27/14 0455 06/27/14 0500 06/27/14 0838  BP: 142/68  149/77   Pulse: 77  79   Temp: 97.7 F (36.5 C)  97.7 F (36.5 C) 98.1 F (36.7 C)  TempSrc: Oral  Oral Oral  Resp: 18  18 18   Height:      Weight:  144 lb 2.9 oz (65.4 kg) 144 lb 2.9 oz (65.4 kg)   SpO2: 99%  98% 100%   Weight change:   Intake/Output Summary (Last 24 hours) at 06/27/14 1031 Last data filed at 06/27/14 0840  Gross per 24 hour  Intake 2773.98 ml  Output    500 ml  Net 2273.98 ml   General appearance: alert, cooperative and no distress  Head: Normocephalic, without obvious abnormality, atraumatic  Eyes: PERRL Throat: poor dental hygeine, missing all upper palate teeth, uvula nondeviated  Lungs: clear to auscultation bilaterally  Heart: regular rate and rhythm, S1, S2 normal, no murmur, click, rub or gallop  Abdomen: soft, non-tender; bowel sounds normal; no masses, no organomegaly  Pulses: 2+ and symmetric  Neuro: CN 2-16 intact, negative rombergs sign, unsteady on feet, wide based gait, decreased sensation on lt side of body vs rt except on forehead where lt sensation is increased compared to rt, rt side LE weakness vs lt side noted.   Lab Results: Basic Metabolic Panel:  Recent Labs Lab 06/26/14 0013 06/26/14 1153 06/27/14  NA 143 140 140  K 5.3 5.1 4.8  CL 108 113* 107  CO2 19  --  18*  GLUCOSE 140* 139* 82  BUN 46* 43* 47*  CREATININE 2.03* 2.20* 1.83*  CALCIUM 9.4  --  8.3*   CBC:  Recent Labs Lab 06/26/14 0013 06/26/14 1153  WBC 6.9  --   HGB 12.3* 14.3  HCT 37.5* 42.0  MCV 102.2*  --   PLT 232  --    Cardiac Enzymes:  Recent Labs Lab 06/26/14 1146 06/26/14 1815 06/26/14 2310  CKTOTAL 119  --   --   TROPONINI <0.30 <0.30 <0.30   Hemoglobin A1C:  Recent Labs Lab 06/26/14 1815  HGBA1C 5.0   Fasting Lipid  Panel:  Recent Labs Lab 06/26/14 1815  CHOL 142  HDL 66  LDLCALC 54  TRIG 112  CHOLHDL 2.2   Thyroid Function Tests:  Recent Labs Lab 06/26/14 1815  TSH 0.872   Anemia Panel:  Recent Labs Lab 06/26/14 1815  VITAMINB12 253   Urinalysis:  Recent Labs Lab 06/26/14 1552  COLORURINE YELLOW  LABSPEC 1.021  PHURINE 5.0  GLUCOSEU NEGATIVE  HGBUR NEGATIVE  BILIRUBINUR SMALL*  KETONESUR NEGATIVE  PROTEINUR 30*  UROBILINOGEN 1.0  NITRITE NEGATIVE  LEUKOCYTESUR SMALL*    Studies/Results: Dg Chest 2 View  06/27/2014   CLINICAL DATA:  Forty top L weight loss, headache  EXAM: CHEST  2 VIEW  COMPARISON:  Chest x-ray of 06/26/2014  FINDINGS: The lungs are clear and slightly hyperaerated. Probable symmetrical nipple shadows are noted at both lung bases. Mediastinal and hilar contours are unremarkable. The heart is within normal limits in size. No bony abnormality is seen.  IMPRESSION: Slight hyper aeration. No active lung disease. Probable nipple shadows symmetrically at the lung bases.   Electronically Signed   By: Ivar Drape M.D.   On: 06/27/2014 09:24   Dg Chest Port 1 View  06/26/2014  CLINICAL DATA:  Chest pain  EXAM: PORTABLE CHEST - 1 VIEW  COMPARISON:  02/27/2012  FINDINGS: Cardiac shadow is within normal limits. Bilateral nipple shadows are noted. The lungs are clear bilaterally. No acute bony abnormality is seen. An old left rib fracture is again noted.  IMPRESSION: No acute abnormality noted.   Electronically Signed   By: Inez Catalina M.D.   On: 06/26/2014 12:35   Medications: I have reviewed the patient's current medications. Scheduled Meds: . aspirin EC  81 mg Oral Daily  . atorvastatin  80 mg Oral q1800  . heparin  5,000 Units Subcutaneous 3 times per day  . pneumococcal 23 valent vaccine  0.5 mL Intramuscular Tomorrow-1000   Continuous Infusions: . sodium chloride 200 mL/hr at 06/27/14 0518   PRN Meds:.acetaminophen, nitroGLYCERIN, ondansetron (ZOFRAN)  IV Assessment/Plan: Principal Problem:   Stable angina Active Problems:   Acute renal failure superimposed on stage 3 chronic kidney disease   Headache   Essential hypertension, benign   Weight loss, unintentional   Current smoker   Alcohol use   Orthostatic hypotension  Dehydration - likely 2/2 to inadequate hydration and presumed heat exhaustion, also w/ ?EtOH use as pt has hx of EtOH abuse. Pt last ate Saturday morning reports recent 45lbs weight loss w/in 3 months due to lack of appetite and lack of teeth. Also pt reports early satiety. Pt had positive orthostatic vital signs- 185/85, HR 71 lying down and 136/76, HR 86 standing up. BUN 43 and Cr 2.20  -BUN anc Cr still elevated today however trending down. Will continue IVF -  IVF at 200cc/hr changed to 100cc/hr since he is no longer having orthostatic hypotension today (133/71, HR82 lying down, 139/64, HR86 standing)    Stable angina. Pt complains of chest pain on exertion that goes away with rest. Negative CXR. Pt also having orthopnea.  - troponins x 3 negative   - repeat EKG  - asp 81 - will start low dose statin  - will start coreg 3.125mg  BID today  - lipid panel WNL  - A1c 5.0 -TSH WNL (0.872) - Dr. Debara Pickett from cardiology saw patient and will proceed w/ ECHO and lexiscan.   HTN-  - will hold off on starting HTN meds at this time as pt is dehydrated w/ orthostatic hypotension. Pt has previously been on norvasc 5mg  w/ rapid improvement of BPs. However thiazides would be the most beneficial for this patient. BP currently 149/77  Muscle cramping  - although possibly due to dehydration will check ABIs in case of claudication.   Orthostatic hypotension- today BPs 133/71, HR82 lying down, 139/64, HR86 standing - resolved, will continue IVFs.   Unintentional weight loss- pt self reports a 40-45lb weight loss w/i 3-4 month period. This could be due to lack of appetite, difficulty eating food w/ missing upper teeth, or a  malignancy.  - pt has long hx of smoking, UA was negative for  - repeat CXR reveals a widened mediastinum that is unchanged from CXR on 02/2012.  - pt having chronic headaches however they do no wake him up from sleep and due to chronic nature not concerning for brain mets.   Lightheadedness- Pt complains of feeling lightheaded and waking up yesterday afternoon with a 5/10 headache. Possibly due to elevated BPs. On admission PE pt had decreased sensation on lt side of body although he reports having rt arm numbness. Also was unsteady during romberg test. Pt may have had a stroke in the past w/  elevated BPs as the inciting event with resolution. Held off on CT head as would not change management.  - Fall precautions   Acute on chronic CKD- 2/2 pre-renal cause likely dehydration  - GFR 02/2012 was 48, admission GFR 35, today GFR is 40, likely improving w/ fluids.   Macrocytic anemia - pt has hx of EtOH abuse  - B12 253 low side of normal, folate pending  Tobacco abuse - nicotine patch   Diet  - NPO for procedures   DVT  - heparin Cass  Dispo: Disposition is deferred at this time, awaiting improvement of current medical problems.    The patient does not have a current PCP (Provider Default, MD) and does need an [facility] hospital follow-up appointment after discharge.    .Services Needed at time of discharge: Y = Yes, Blank = No PT:   OT:   RN:   Equipment:   Other:     LOS: 1 day   Julious Oka, MD 06/27/2014, 10:31 AM

## 2014-06-27 NOTE — Consult Note (Signed)
CONSULTATION NOTE  Reason for Consult: Chest pain, dyspnea  Requesting Physician: Dr. Eppie Gibson  Cardiologist: None (New)  HPI: This is a 73 y.o. male with a past medical history significant for hypertension with a history of orthostatic hypotension, acute on chronic CTD 3, alcohol use, 40 pack years of smoking and intermittent headaches. He reports over the past several days he's had progressive shortness of breath with activities. He told me that when he goes out in the heat and does some activities such as gardening, he notes that he has some fullness in his chest and shortness of breath and then developed headache and feels very tired. He says he has to sit down and then had some improvement in his symptoms. He describes his chest discomfort as a feeling of fullness such as with needing to belch. He occasionally does belch with some relief. This does not always get rid of his chest discomfort. Sometimes his symptoms are improved with exertion and at other times they  present at rest. He does report that his symptoms are somewhat better when laying down, but this is not immediately and seems to be more related with resting rather than change in position. He did not see a primary care provider regularly and has never had cardiac testing in the past. EKG is abnormal with LVH by voltage and 2-3 J-point ST elevation in leads V3 through V6, and 2, 3 and aVF. This could represent early repolarization, pericarditis or potentially ischemia.  Cardiac enzymes were negative x3 overnight. Lipid profile shows good control with total cholesterol 142, triglycerides 112, HDL 66 and LDL 54. Hemoglobin A1c is 5. Chest x-ray shows slight hyperaeration of the lungs but otherwise no disease. Urinalysis indicates dehydration and he's been treated for such.  PMHx:  Past Medical History  Diagnosis Date  . Hypertension   . Headache(784.0)    History reviewed. No pertinent past surgical history.  FAMHx: Family  History  Problem Relation Age of Onset  . Hypertension Mother   . Diabetes Mother     SOCHx:  reports that he has been smoking Cigarettes.  He has a 20 pack-year smoking history. He does not have any smokeless tobacco history on file. He reports that he drinks about 5.4 ounces of alcohol per week. He reports that he does not use illicit drugs.  ALLERGIES: No Known Allergies  ROS: A comprehensive review of systems was negative except for: Constitutional: positive for fatigue and weight loss Respiratory: positive for dyspnea on exertion Cardiovascular: positive for chest pain Neurological: positive for headaches  HOME MEDICATIONS: No prescriptions prior to admission    HOSPITAL MEDICATIONS: Scheduled: . aspirin EC  81 mg Oral Daily  . atorvastatin  80 mg Oral q1800  . heparin  5,000 Units Subcutaneous 3 times per day  . pneumococcal 23 valent vaccine  0.5 mL Intramuscular Tomorrow-1000    VITALS: Blood pressure 149/77, pulse 79, temperature 98.1 F (36.7 C), temperature source Oral, resp. rate 18, height $RemoveBe'6\' 2"'ASEJhqawT$  (1.88 m), weight 144 lb 2.9 oz (65.4 kg), SpO2 100.00%.  PHYSICAL EXAM: General appearance: alert and no distress Neck: no carotid bruit and no JVD Lungs: clear to auscultation bilaterally Heart: regular rate and rhythm, S1, S2 normal, no murmur, click, rub or gallop Abdomen: soft, non-tender; bowel sounds normal; no masses,  no organomegaly and scaphoid Extremities: extremities normal, atraumatic, no cyanosis or edema Pulses: 2+ and symmetric Skin: Skin color, texture, turgor normal. No rashes or lesions Neurologic: Grossly normal Psych: Pleasant  LABS: Results for orders placed during the hospital encounter of 06/26/14 (from the past 48 hour(s))  CBC     Status: Abnormal   Collection Time    06/26/14 12:13 AM      Result Value Ref Range   WBC 6.9  4.0 - 10.5 K/uL   RBC 3.67 (*) 4.22 - 5.81 MIL/uL   Hemoglobin 12.3 (*) 13.0 - 17.0 g/dL   HCT 37.5 (*) 39.0 -  52.0 %   MCV 102.2 (*) 78.0 - 100.0 fL   MCH 33.5  26.0 - 34.0 pg   MCHC 32.8  30.0 - 36.0 g/dL   RDW 15.6 (*) 11.5 - 15.5 %   Platelets 232  150 - 400 K/uL  BASIC METABOLIC PANEL     Status: Abnormal   Collection Time    06/26/14 12:13 AM      Result Value Ref Range   Sodium 143  137 - 147 mEq/L   Potassium 5.3  3.7 - 5.3 mEq/L   Chloride 108  96 - 112 mEq/L   CO2 19  19 - 32 mEq/L   Glucose, Bld 140 (*) 70 - 99 mg/dL   BUN 46 (*) 6 - 23 mg/dL   Creatinine, Ser 2.03 (*) 0.50 - 1.35 mg/dL   Calcium 9.4  8.4 - 10.5 mg/dL   GFR calc non Af Amer 30 (*) >90 mL/min   GFR calc Af Amer 35 (*) >90 mL/min   Comment: (NOTE)     The eGFR has been calculated using the CKD EPI equation.     This calculation has not been validated in all clinical situations.     eGFR's persistently <90 mL/min signify possible Chronic Kidney     Disease.   Anion gap 16 (*) 5 - 15  TROPONIN I     Status: None   Collection Time    06/26/14 11:46 AM      Result Value Ref Range   Troponin I <0.30  <0.30 ng/mL   Comment:            Due to the release kinetics of cTnI,     a negative result within the first hours     of the onset of symptoms does not rule out     myocardial infarction with certainty.     If myocardial infarction is still suspected,     repeat the test at appropriate intervals.  CK     Status: None   Collection Time    06/26/14 11:46 AM      Result Value Ref Range   Total CK 119  7 - 232 U/L  I-STAT TROPOININ, ED     Status: None   Collection Time    06/26/14 11:52 AM      Result Value Ref Range   Troponin i, poc 0.01  0.00 - 0.08 ng/mL   Comment 3            Comment: Due to the release kinetics of cTnI,     a negative result within the first hours     of the onset of symptoms does not rule out     myocardial infarction with certainty.     If myocardial infarction is still suspected,     repeat the test at appropriate intervals.  I-STAT CHEM 8, ED     Status: Abnormal   Collection  Time    06/26/14 11:53 AM      Result Value Ref Range  Sodium 140  137 - 147 mEq/L   Potassium 5.1  3.7 - 5.3 mEq/L   Chloride 113 (*) 96 - 112 mEq/L   BUN 43 (*) 6 - 23 mg/dL   Creatinine, Ser 2.20 (*) 0.50 - 1.35 mg/dL   Glucose, Bld 139 (*) 70 - 99 mg/dL   Calcium, Ion 1.24  1.13 - 1.30 mmol/L   TCO2 18  0 - 100 mmol/L   Hemoglobin 14.3  13.0 - 17.0 g/dL   HCT 42.0  39.0 - 52.0 %  URINE RAPID DRUG SCREEN (HOSP PERFORMED)     Status: None   Collection Time    06/26/14  3:52 PM      Result Value Ref Range   Opiates NONE DETECTED  NONE DETECTED   Cocaine NONE DETECTED  NONE DETECTED   Benzodiazepines NONE DETECTED  NONE DETECTED   Amphetamines NONE DETECTED  NONE DETECTED   Tetrahydrocannabinol NONE DETECTED  NONE DETECTED   Barbiturates NONE DETECTED  NONE DETECTED   Comment:            DRUG SCREEN FOR MEDICAL PURPOSES     ONLY.  IF CONFIRMATION IS NEEDED     FOR ANY PURPOSE, NOTIFY LAB     WITHIN 5 DAYS.                LOWEST DETECTABLE LIMITS     FOR URINE DRUG SCREEN     Drug Class       Cutoff (ng/mL)     Amphetamine      1000     Barbiturate      200     Benzodiazepine   902     Tricyclics       409     Opiates          300     Cocaine          300     THC              50  SODIUM, URINE, RANDOM     Status: None   Collection Time    06/26/14  3:52 PM      Result Value Ref Range   Sodium, Ur 87    CREATININE, URINE, RANDOM     Status: None   Collection Time    06/26/14  3:52 PM      Result Value Ref Range   Creatinine, Urine 261.39    URINALYSIS, ROUTINE W REFLEX MICROSCOPIC     Status: Abnormal   Collection Time    06/26/14  3:52 PM      Result Value Ref Range   Color, Urine YELLOW  YELLOW   APPearance CLEAR  CLEAR   Specific Gravity, Urine 1.021  1.005 - 1.030   pH 5.0  5.0 - 8.0   Glucose, UA NEGATIVE  NEGATIVE mg/dL   Hgb urine dipstick NEGATIVE  NEGATIVE   Bilirubin Urine SMALL (*) NEGATIVE   Ketones, ur NEGATIVE  NEGATIVE mg/dL   Protein, ur 30  (*) NEGATIVE mg/dL   Urobilinogen, UA 1.0  0.0 - 1.0 mg/dL   Nitrite NEGATIVE  NEGATIVE   Leukocytes, UA SMALL (*) NEGATIVE  URINE MICROSCOPIC-ADD ON     Status: Abnormal   Collection Time    06/26/14  3:52 PM      Result Value Ref Range   Squamous Epithelial / LPF RARE  RARE   WBC, UA 7-10  <3 WBC/hpf   Bacteria, UA RARE  RARE   Casts HYALINE CASTS (*) NEGATIVE   Comment: GRANULAR CAST  TSH     Status: None   Collection Time    06/26/14  6:15 PM      Result Value Ref Range   TSH 0.872  0.350 - 4.500 uIU/mL  TROPONIN I     Status: None   Collection Time    06/26/14  6:15 PM      Result Value Ref Range   Troponin I <0.30  <0.30 ng/mL   Comment:            Due to the release kinetics of cTnI,     a negative result within the first hours     of the onset of symptoms does not rule out     myocardial infarction with certainty.     If myocardial infarction is still suspected,     repeat the test at appropriate intervals.  HEMOGLOBIN A1C     Status: None   Collection Time    06/26/14  6:15 PM      Result Value Ref Range   Hemoglobin A1C 5.0  <5.7 %   Comment: (NOTE)                                                                               According to the ADA Clinical Practice Recommendations for 2011, when     HbA1c is used as a screening test:      >=6.5%   Diagnostic of Diabetes Mellitus               (if abnormal result is confirmed)     5.7-6.4%   Increased risk of developing Diabetes Mellitus     References:Diagnosis and Classification of Diabetes Mellitus,Diabetes     GQBV,6945,03(UUEKC 1):S62-S69 and Standards of Medical Care in             Diabetes - 2011,Diabetes Care,2011,34 (Suppl 1):S11-S61.   Mean Plasma Glucose 97  <117 mg/dL   Comment: Performed at [facility]     Status: None   Collection Time    06/26/14  6:15 PM      Result Value Ref Range   Cholesterol 142  0 - 200 mg/dL   Triglycerides 112  <150 mg/dL   HDL 66  >39 mg/dL    Total CHOL/HDL Ratio 2.2     VLDL 22  0 - 40 mg/dL   LDL Cholesterol 54  0 - 99 mg/dL   Comment:            Total Cholesterol/HDL:CHD Risk     Coronary Heart Disease Risk Table                         Men   Women      1/2 Average Risk   3.4   3.3      Average Risk       5.0   4.4      2 X Average Risk   9.6   7.1      3 X Average Risk  23.4   11.0  Use the calculated Patient Ratio     above and the CHD Risk Table     to determine the patient's CHD Risk.                ATP III CLASSIFICATION (LDL):      <100     mg/dL   Optimal      100-129  mg/dL   Near or Above                        Optimal      130-159  mg/dL   Borderline      160-189  mg/dL   High      >190     mg/dL   Very High  VITAMIN B12     Status: None   Collection Time    06/26/14  6:15 PM      Result Value Ref Range   Vitamin B-12 253  211 - 911 pg/mL   Comment: Performed at [facility]  TROPONIN I     Status: None   Collection Time    06/26/14 11:10 PM      Result Value Ref Range   Troponin I <0.30  <0.30 ng/mL   Comment:            Due to the release kinetics of cTnI,     a negative result within the first hours     of the onset of symptoms does not rule out     myocardial infarction with certainty.     If myocardial infarction is still suspected,     repeat the test at appropriate intervals.  BASIC METABOLIC PANEL     Status: Abnormal   Collection Time    06/27/14 12:00 AM      Result Value Ref Range   Sodium 140  137 - 147 mEq/L   Potassium 4.8  3.7 - 5.3 mEq/L   Chloride 107  96 - 112 mEq/L   CO2 18 (*) 19 - 32 mEq/L   Glucose, Bld 82  70 - 99 mg/dL   BUN 47 (*) 6 - 23 mg/dL   Creatinine, Ser 1.83 (*) 0.50 - 1.35 mg/dL   Calcium 8.3 (*) 8.4 - 10.5 mg/dL   GFR calc non Af Amer 34 (*) >90 mL/min   GFR calc Af Amer 40 (*) >90 mL/min   Comment: (NOTE)     The eGFR has been calculated using the CKD EPI equation.     This calculation has not been validated in all clinical  situations.     eGFR's persistently <90 mL/min signify possible Chronic Kidney     Disease.   Anion gap 15  5 - 15    IMAGING: Dg Chest 2 View  06/27/2014   CLINICAL DATA:  Forty top L weight loss, headache  EXAM: CHEST  2 VIEW  COMPARISON:  Chest x-ray of 06/26/2014  FINDINGS: The lungs are clear and slightly hyperaerated. Probable symmetrical nipple shadows are noted at both lung bases. Mediastinal and hilar contours are unremarkable. The heart is within normal limits in size. No bony abnormality is seen.  IMPRESSION: Slight hyper aeration. No active lung disease. Probable nipple shadows symmetrically at the lung bases.   Electronically Signed   By: Ivar Drape M.D.   On: 06/27/2014 09:24   Dg Chest Port 1 View  06/26/2014   CLINICAL DATA:  Chest pain  EXAM: PORTABLE CHEST - 1 VIEW  COMPARISON:  02/27/2012  FINDINGS: Cardiac shadow is within normal limits. Bilateral nipple shadows are noted. The lungs are clear bilaterally. No acute bony abnormality is seen. An old left rib fracture is again noted.  IMPRESSION: No acute abnormality noted.   Electronically Signed   By: Inez Catalina M.D.   On: 06/26/2014 12:35    HOSPITAL DIAGNOSES: Principal Problem:   Chest pain Active Problems:   Acute renal failure superimposed on stage 3 chronic kidney disease   Headache   Essential hypertension, benign   Weight loss, unintentional   Current smoker   Alcohol use   Orthostatic hypotension   DOE (dyspnea on exertion)   IMPRESSION: 1. Chest pain, with typical and atypical features 2. Abnormal EKG demonstrating repolarization abnormality, possible pericarditis or ischemia 3. DOE 4. HTN  RECOMMENDATION: 1. Mr. Mahmood has a few cardiac risk factors including age, hypertension and an abnormal EKG which could be explained by a number of cardiac etiologies.  His chest pain seems atypical to me and his cardiac enzymes were negative, nonetheless given his EKG abnormality, it would be prudent to ask to  exclude ischemia. EKG will be difficult to interpret with traditional treadmill stress testing due to elevation and LVH. I would recommend a LexiScan Myoview to further evaluate for ischemia.  I would also recommend an echocardiogram given his shortness of breath, electrical features of LVH, and to rule out possible pericarditis.  Thank you for consulting [place]. We will be happy to follow with you.  Time Spent Directly with Patient: 30 minutes  Pixie Casino, MD, [facility] Attending Cardiologist [name]  Willisha Sligar C 06/27/2014, 11:31 AM

## 2014-06-27 NOTE — Progress Notes (Signed)
  I have seen and examined the patient, and reviewed the daily progress note by Hulan Saas, MS 3 and discussed the care of the patient with them. Please see my progress note from 06/27/2014 for further details regarding assessment and plan.    Signed:  Julious Oka, MD 06/27/2014, 3:46 PM

## 2014-06-27 NOTE — Progress Notes (Addendum)
INITIAL NUTRITION ASSESSMENT  DOCUMENTATION CODES Per approved criteria  -Severe malnutrition in the context of chronic illness   INTERVENTION:  Ensure Complete PO TID, each supplement provides 350 kcal and 13 grams of protein  NUTRITION DIAGNOSIS: Malnutrition related to inadequate oral intake as evidenced by severe depletion of muscle mass, intake </= 75% of estimated energy requirement for >/= 1 month, and 24% weight loss within the past 6 months.   Goal: Intake to meet >90% of estimated nutrition needs.  Monitor:  PO intake, labs, weight trend.  Reason for Assessment: MD Consult; MST  73 y.o. male  Admitting Dx: Chest pain  ASSESSMENT: 73 y/o male w/ PMH of HTN, EtOH abuse, CKD, and macrocytic anemia who presents to the ED after waking up yesterday with a headache and chest pain. Pt reports 45lbs weight loss w/in 3-4 months due to lack of appetite, lack of teeth, and early satiety. Pt drinks 3 cans of beer a few days a week and smokes 10-12 ciggs/day for the past 45 years.   Patient reports that he usually eats just one meal per day. He has coffee for breakfast, water at lunch time after working in the garden, and a small supper (1/2 can of beans and franks or a sandwich). He has lost 45 lbs over the past 3-4 months. He c/o early satiety and feeling full all the time. He ate all of his lunch today because he was hungry.  Nutrition Focused Physical Exam:  Subcutaneous Fat:  Orbital Region: moderate depletion Upper Arm Region: moderate depletion Thoracic and Lumbar Region: moderate depletion  Muscle:  Temple Region: moderate depletion Clavicle Bone Region: moderate depletion Clavicle and Acromion Bone Region: moderate depletion Scapular Bone Region: moderate depletion Dorsal Hand: moderate depletion Patellar Region: severe depletion Anterior Thigh Region: severe depletion Posterior Calf Region: severe depletion  Edema: none  Pt meets criteria for severe MALNUTRITION  in the context of chronic illness as evidenced by severe depletion of muscle mass, intake </= 75% of estimated energy requirement for >/= 1 month, and 24% weight loss within the past 6 months.   Height: Ht Readings from Last 1 Encounters:  06/26/14 6\' 2"  (1.88 m)    Weight: Wt Readings from Last 1 Encounters:  06/27/14 144 lb 2.9 oz (65.4 kg)    Ideal Body Weight: 86.4 kg  % Ideal Body Weight: 76%  Wt Readings from Last 10 Encounters:  06/27/14 144 lb 2.9 oz (65.4 kg)  06/27/14 144 lb 2.9 oz (65.4 kg)    Usual Body Weight: 190 lb  % Usual Body Weight: 76%  BMI:  Body mass index is 18.5 kg/(m^2).  Estimated Nutritional Needs: Kcal: 2000 Protein: 100 gm Fluid: 2 L  Skin: WDL  Diet Order: Criss Rosales  EDUCATION NEEDS: -Education needs addressed   Intake/Output Summary (Last 24 hours) at 06/27/14 1225 Last data filed at 06/27/14 1046  Gross per 24 hour  Intake 2773.98 ml  Output    650 ml  Net 2123.98 ml    Last BM: 7/8   Labs:   Recent Labs Lab 06/26/14 0013 06/26/14 1153 06/27/14  NA 143 140 140  K 5.3 5.1 4.8  CL 108 113* 107  CO2 19  --  18*  BUN 46* 43* 47*  CREATININE 2.03* 2.20* 1.83*  CALCIUM 9.4  --  8.3*  GLUCOSE 140* 139* 82    CBG (last 3)  No results found for this basename: GLUCAP,  in the last 72 hours  Scheduled Meds: .  aspirin EC  81 mg Oral Daily  . atorvastatin  80 mg Oral q1800  . heparin  5,000 Units Subcutaneous 3 times per day  . pneumococcal 23 valent vaccine  0.5 mL Intramuscular Tomorrow-1000    Continuous Infusions: . sodium chloride 100 mL/hr at 06/27/14 1145    Past Medical History  Diagnosis Date  . Hypertension   . Headache(784.0)     History reviewed. No pertinent past surgical history.   Molli Barrows, RD, LDN, [place] Pager [phone] After Hours Pager [phone]

## 2014-06-27 NOTE — Plan of Care (Signed)
Problem: Phase II Progression Outcomes Goal: IV changed to normal saline lock Outcome: Not Met (add Reason) Normal saline continuous infusion ordered

## 2014-06-28 ENCOUNTER — Inpatient Hospital Stay (HOSPITAL_COMMUNITY): Payer: Medicare Other

## 2014-06-28 DIAGNOSIS — I70219 Atherosclerosis of native arteries of extremities with intermittent claudication, unspecified extremity: Secondary | ICD-10-CM

## 2014-06-28 DIAGNOSIS — D539 Nutritional anemia, unspecified: Secondary | ICD-10-CM

## 2014-06-28 DIAGNOSIS — I951 Orthostatic hypotension: Secondary | ICD-10-CM

## 2014-06-28 DIAGNOSIS — F17200 Nicotine dependence, unspecified, uncomplicated: Secondary | ICD-10-CM

## 2014-06-28 DIAGNOSIS — R634 Abnormal weight loss: Secondary | ICD-10-CM

## 2014-06-28 DIAGNOSIS — R55 Syncope and collapse: Secondary | ICD-10-CM

## 2014-06-28 DIAGNOSIS — E875 Hyperkalemia: Secondary | ICD-10-CM

## 2014-06-28 DIAGNOSIS — N183 Chronic kidney disease, stage 3 unspecified: Secondary | ICD-10-CM

## 2014-06-28 DIAGNOSIS — R252 Cramp and spasm: Secondary | ICD-10-CM

## 2014-06-28 DIAGNOSIS — R079 Chest pain, unspecified: Secondary | ICD-10-CM

## 2014-06-28 LAB — BASIC METABOLIC PANEL
ANION GAP: 11 (ref 5–15)
ANION GAP: 15 (ref 5–15)
BUN: 29 mg/dL — ABNORMAL HIGH (ref 6–23)
BUN: 24 mg/dL — ABNORMAL HIGH (ref 6–23)
CHLORIDE: 111 meq/L (ref 96–112)
CO2: 19 meq/L (ref 19–32)
CO2: 19 mEq/L (ref 19–32)
Calcium: 8.3 mg/dL — ABNORMAL LOW (ref 8.4–10.5)
Calcium: 8.6 mg/dL (ref 8.4–10.5)
Chloride: 104 mEq/L (ref 96–112)
Creatinine, Ser: 1.43 mg/dL — ABNORMAL HIGH (ref 0.50–1.35)
Creatinine, Ser: 1.24 mg/dL (ref 0.50–1.35)
GFR calc Af Amer: 54 mL/min — ABNORMAL LOW (ref >90)
GFR calc non Af Amer: 46 mL/min — ABNORMAL LOW (ref >90)
GFR calc non Af Amer: 55 mL/min — ABNORMAL LOW (ref >90)
GFR, EST AFRICAN AMERICAN: 64 mL/min — AB (ref >90)
Glucose, Bld: 88 mg/dL (ref 70–99)
Glucose, Bld: 78 mg/dL (ref 70–99)
POTASSIUM: 5.7 meq/L — AB (ref 3.7–5.3)
POTASSIUM: 5 meq/L (ref 3.7–5.3)
SODIUM: 141 meq/L (ref 137–147)
Sodium: 138 mEq/L (ref 137–147)

## 2014-06-28 MED ORDER — AMLODIPINE BESYLATE 5 MG PO TABS
5.0000 mg | ORAL_TABLET | Freq: Every day | ORAL | Status: DC
  Filled 2014-06-28: qty 1

## 2014-06-28 MED ORDER — TECHNETIUM TC 99M SESTAMIBI GENERIC - CARDIOLITE
30.0000 | Freq: Once | INTRAVENOUS | Status: AC | PRN
  Administered 2014-06-28: 30 via INTRAVENOUS

## 2014-06-28 MED ORDER — REGADENOSON 0.4 MG/5ML IV SOLN
0.4000 mg | Freq: Once | INTRAVENOUS | Status: AC
  Administered 2014-06-28: 0.4 mg via INTRAVENOUS
  Filled 2014-06-28: qty 5

## 2014-06-28 MED ORDER — REGADENOSON 0.4 MG/5ML IV SOLN
INTRAVENOUS | Status: AC
  Administered 2014-06-28: 0.4 mg via INTRAVENOUS
  Filled 2014-06-28: qty 5

## 2014-06-28 MED ORDER — TECHNETIUM TC 99M SESTAMIBI GENERIC - CARDIOLITE
10.0000 | Freq: Once | INTRAVENOUS | Status: AC | PRN
  Administered 2014-06-28: 10 via INTRAVENOUS

## 2014-06-28 NOTE — ED Provider Notes (Signed)
I saw and evaluated the patient, reviewed the resident's note and I agree with the findings and plan.   EKG Interpretation   Date/Time:  Monday June 26 2014 11:51:10 EDT Ventricular Rate:  87 PR Interval:  103 QRS Duration: 87 QT Interval:  341 QTC Calculation: 410 R Axis:   79 Text Interpretation:  Sinus rhythm Short PR interval Left ventricular  hypertrophy Nonspecific ST abnormality `st elev inf and v3-v4 noted on  prior ecg from 2011 Confirmed by [facility]  MD, Lennette Bihari ([id number]) on 06/26/2014  12:02:46 PM      Pt w recent syncopal event, chest tightness. Pt alert, content.  ecg w st elev ant/inf, however ecg unchanged from prior - also reviewed by cardiology, D [place], felt to be c/w prior, possibly due to lvh/repol abn.  Currently cp free. abd soft nt. Chest cta.  Labs. Monitor. Admit.   Mirna Mires, MD 06/28/14 [phone]

## 2014-06-28 NOTE — Progress Notes (Signed)
Internal Medicine Attending  Date: 06/28/2014  Patient name: Derrick Hendrix Medical record number: [id number] Date of birth: 10-31-1941 Age: 73 y.o. Gender: male  I saw and evaluated the patient. I developed the assessment and plan with the housestaff and reviewed the resident's note by Dr. Hulen Luster.  I agree with the resident's findings and plans as documented in her progress note.  Derrick Hendrix feels well this evening and is w/o complaints.  He is currently lying comfortably flat in bed.  Myoview was notable for no reversible ischemia and a questionable scar inferiorly.  ABIs were remarkable for a R 0.71 and a L 1.14 (supranormal suggesting non-compressibility).  With no LV dysfunction we will start cilostazol and anticipate discharge home in the AM with follow-up and management of his chronic medical issues in the [facility].

## 2014-06-28 NOTE — Progress Notes (Signed)
  I have seen and examined the patient, and reviewed the daily progress note by Hulan Saas, MS 3 and discussed the care of the patient with them. Please see my progress note from 06/28/2014 for further details regarding assessment and plan.    Signed:  Julious Oka, MD 06/28/2014, 5:00 PM

## 2014-06-28 NOTE — Progress Notes (Signed)
Subjective: No chest pain  Objective: Vital signs in last 24 hours: Temp:  [97.7 F (36.5 C)-98.5 F (36.9 C)] 97.7 F (36.5 C) (07/15 0455) Pulse Rate:  [79-104] 104 (07/15 0455) Resp:  [16-18] 16 (07/15 0810) BP: (132-179)/(66-85) 179/83 mmHg (07/15 0939) SpO2:  [98 %-100 %] 99 % (07/15 0810) Weight:  [150 lb (68.04 kg)] 150 lb (68.04 kg) (07/15 0455) Weight change: 11 lb 3.2 oz (5.08 kg) Last BM Date: 06/21/14 Intake/Output from previous day: 07/14 0701 - 07/15 0700 In: 2898.3 [P.O.:1000; I.V.:1898.3] Out: 1400 [Urine:1400] Intake/Output this shift:    PE: General:Pleasant affect, NAD Skin:Warm and dry, brisk capillary refill HEENT:normocephalic, sclera clear, mucus membranes moist Heart:S1S2 RRR without murmur, gallup, rub or click Lungs:clear without rales, rhonchi, or wheezes [id number], non tender, + BS, do not palpate liver spleen or masses Ext:no lower ext edema, 2+ pedal pulses, 2+ radial pulses Neuro:alert and oriented, MAE, follows commands, + facial symmetry   Lab Results:  Recent Labs  06/26/14 0013 06/26/14 1153  WBC 6.9  --   HGB 12.3* 14.3  HCT 37.5* 42.0  PLT 232  --    BMET  Recent Labs  06/27/14 06/28/14 0600  NA 140 141  K 4.8 5.7*  CL 107 111  CO2 18* 19  GLUCOSE 82 88  BUN 47* 29*  CREATININE 1.83* 1.43*  CALCIUM 8.3* 8.3*    Recent Labs  06/26/14 1815 06/26/14 2310  TROPONINI <0.30 <0.30    Lab Results  Component Value Date   CHOL 142 06/26/2014   HDL 66 06/26/2014   LDLCALC 54 06/26/2014   TRIG 112 06/26/2014   CHOLHDL 2.2 06/26/2014   Lab Results  Component Value Date   HGBA1C 5.0 06/26/2014     Lab Results  Component Value Date   TSH 0.872 06/26/2014    Hepatic Function Panel No results found for this basename: PROT, ALBUMIN, AST, ALT, ALKPHOS, BILITOT, BILIDIR, IBILI,  in the last 72 hours  Recent Labs  06/26/14 1815  CHOL 142   No results found for this basename: PROTIME,  in the last 72  hours     Studies/Results: Dg Chest 2 View  06/27/2014   CLINICAL DATA:  Forty top L weight loss, headache  EXAM: CHEST  2 VIEW  COMPARISON:  Chest x-ray of 06/26/2014  FINDINGS: The lungs are clear and slightly hyperaerated. Probable symmetrical nipple shadows are noted at both lung bases. Mediastinal and hilar contours are unremarkable. The heart is within normal limits in size. No bony abnormality is seen.  IMPRESSION: Slight hyper aeration. No active lung disease. Probable nipple shadows symmetrically at the lung bases.   Electronically Signed   By: Ivar Drape M.D.   On: 06/27/2014 09:24   Dg Chest Port 1 View  06/26/2014   CLINICAL DATA:  Chest pain  EXAM: PORTABLE CHEST - 1 VIEW  COMPARISON:  02/27/2012  FINDINGS: Cardiac shadow is within normal limits. Bilateral nipple shadows are noted. The lungs are clear bilaterally. No acute bony abnormality is seen. An old left rib fracture is again noted.  IMPRESSION: No acute abnormality noted.   Electronically Signed   By: Inez Catalina M.D.   On: 06/26/2014 12:35   2D DEcho:  - Left ventricle: The cavity size was normal. Wall thickness was normal. Systolic function was normal. The estimated ejection fraction was in the range of 55% to 60%. Wall motion was normal; there were no regional wall motion  abnormalities. Doppler parameters are consistent with abnormal left ventricular relaxation (grade 1 diastolic dysfunction). The E/e&' ratio is between 8-15, suggesting indeterminate LV Filling pressure. - Mitral valve: Mildly thickened leaflets . There was trivial regurgitation. - Pericardium, extracardiac: The pericardium was normal in appearance. There was no pericardial effusion.     Medications: I have reviewed the patient's current medications. Scheduled Meds: . aspirin EC  81 mg Oral Daily  . atorvastatin  80 mg Oral q1800  . carvedilol  3.125 mg Oral BID WC  . cyanocobalamin  1,000 mcg Intramuscular Once  . feeding supplement  (ENSURE COMPLETE)  237 mL Oral TID BM  . heparin  5,000 Units Subcutaneous 3 times per day  . nicotine  14 mg Transdermal Daily   Continuous Infusions: . sodium chloride 100 mL/hr at 06/27/14 1914   PRN Meds:.acetaminophen, nitroGLYCERIN, ondansetron (ZOFRAN) IV  Assessment/Plan: Principal Problem:   Stable angina- negative MI for nuc study Active Problems:   Chest pain   Acute renal failure superimposed on stage 3 chronic kidney disease- cr improved   Headache   Essential hypertension, benign   Weight loss, unintentional   Current smoker   Alcohol use   Orthostatic hypotension   DOE (dyspnea on exertion)   Vitamin B12 deficiency   Hyperkalemia-  Ok for nuc discussed with Dr. Mare Ferrari   LOS: 2 days   Time spent with pt. :15 minutes. [facility]  Nurse Practitioner Certified Pager [phone] or after 5pm and on weekends call [phone] 06/28/2014, 10:09 AM  Patient underwent Lexiscan Myoview stress test this morning, results pending.  He is not complaining of any chest discomfort this morning.  Has mild headache after stress test.  Presently eating lunch. His echocardiogram yesterday showed no wall motion abnormalities.  Ejection fraction was normal. No further ischemia workup anticipated if Myoview is negative.

## 2014-06-28 NOTE — Progress Notes (Signed)
Subjective: Overnight, patient slept well. No new complaints or concerns. He does, however, state he has a headache. Rates it as 7/10. Slightly more intense than yesterday, but he doesn't seem concerned. When asked about his ability to walk he says its improved, still some unsteadiness. He believes this is better than yesterday.   Events: He reported he had a bowel movement, this is important as he has been constipated the past couple days. He said there was nothing abnormal about his stool. Denied any blood, or foul smells.   Objective: Vital signs in last 24 hours: Filed Vitals:   06/28/14 1012 06/28/14 1014 06/28/14 1016 06/28/14 1018  BP: 141/71 159/76 152/75 146/74  Pulse:      Temp:      TempSrc:      Resp:      Height:      Weight:      SpO2:       Weight change: 5.08 kg (11 lb 3.2 oz)  Intake/Output Summary (Last 24 hours) at 06/28/14 1147 Last data filed at 06/28/14 0500  Gross per 24 hour  Intake 2658.33 ml  Output   1050 ml  Net 1608.33 ml   Physical Exam:  General: Well appearing, thin male. In no acute distress.  Head: no evidence of trauma Heart: RRR, S1 and S2 with no s3 or s4, no murmurs, rubs or gallops. Lungs: No increase work of breathing, lungs clear to auscultation, no wheezes or crackles. Abdomen: Non distended, no scars, normoactive bowel sounds, no bruits, non-tender to palpation.  Exteremities: No clubbing, cyanosis or edema.  Neuro:  alert and oriented x 3 (person, place and time), CN II-XII intact;  Motor 4/5 on right but 5/5 on left side. Sensory testing normal to light touch. Finger-nose and Heel to shin/point to point testing normal.  Rapid alternating movements normal;  Gait: normal get up and go, normal heel-toe and tandem gait Skin: normal texture, normal turgor, warm, dry, no rash  Lab Results: Basic Metabolic Panel:  Recent Labs Lab 06/27/14 06/28/14 0600  NA 140 141  K 4.8 5.7*  CL 107 111  CO2 18* 19  GLUCOSE 82 88  BUN 47* 29*    CREATININE 1.83* 1.43*  CALCIUM 8.3* 8.3*   CBC:  Recent Labs Lab 06/26/14 0013 06/26/14 1153  WBC 6.9  --   HGB 12.3* 14.3  HCT 37.5* 42.0  MCV 102.2*  --   PLT 232  --    Cardiac Enzymes:  Recent Labs Lab 06/26/14 1146 06/26/14 1815 06/26/14 2310  CKTOTAL 119  --   --   TROPONINI <0.30 <0.30 <0.30   Hemoglobin A1C:  Recent Labs Lab 06/26/14 1815  HGBA1C 5.0   Fasting Lipid Panel:  Recent Labs Lab 06/26/14 1815  CHOL 142  HDL 66  LDLCALC 54  TRIG 112  CHOLHDL 2.2   Thyroid Function Tests:  Recent Labs Lab 06/26/14 1815  TSH 0.872   Anemia Panel:  Recent Labs Lab 06/26/14 1815  VITAMINB12 253   Urine Drug Screen: Drugs of Abuse     Component Value Date/Time   LABOPIA NONE DETECTED 06/26/2014 1552   COCAINSCRNUR NONE DETECTED 06/26/2014 1552   LABBENZ NONE DETECTED 06/26/2014 1552   AMPHETMU NONE DETECTED 06/26/2014 1552   THCU NONE DETECTED 06/26/2014 1552   LABBARB NONE DETECTED 06/26/2014 1552    Urinalysis:  Recent Labs Lab 06/26/14 [place]  LABSPEC 1.021  PHURINE 5.0  [place] NEGATIVE  BILIRUBINUR  SMALL*  KETONESUR NEGATIVE  PROTEINUR 30*  UROBILINOGEN 1.0  NITRITE NEGATIVE  LEUKOCYTESUR SMALL*   Studies/Results: Dg Chest 2 View  06/27/2014   CLINICAL DATA:  Forty top L weight loss, headache  EXAM: CHEST  2 VIEW  COMPARISON:  Chest x-ray of 06/26/2014  FINDINGS: The lungs are clear and slightly hyperaerated. Probable symmetrical nipple shadows are noted at both lung bases. Mediastinal and hilar contours are unremarkable. The heart is within normal limits in size. No bony abnormality is seen.  IMPRESSION: Slight hyper aeration. No active lung disease. Probable nipple shadows symmetrically at the lung bases.   Electronically Signed   By: Ivar Drape M.D.   On: 06/27/2014 09:24   Dg Chest Port 1 View  06/26/2014   CLINICAL DATA:  Chest pain  EXAM: PORTABLE CHEST - 1 VIEW  COMPARISON:  02/27/2012   FINDINGS: Cardiac shadow is within normal limits. Bilateral nipple shadows are noted. The lungs are clear bilaterally. No acute bony abnormality is seen. An old left rib fracture is again noted.  IMPRESSION: No acute abnormality noted.   Electronically Signed   By: Inez Catalina M.D.   On: 06/26/2014 12:35   Medications: I have reviewed the patient's current medications. Scheduled Meds: . amLODipine  5 mg Oral Daily  . aspirin EC  81 mg Oral Daily  . atorvastatin  80 mg Oral q1800  . carvedilol  3.125 mg Oral BID WC  . cyanocobalamin  1,000 mcg Intramuscular Once  . feeding supplement (ENSURE COMPLETE)  237 mL Oral TID BM  . heparin  5,000 Units Subcutaneous 3 times per day  . nicotine  14 mg Transdermal Daily   Continuous Infusions: . sodium chloride 100 mL/hr at 06/27/14 1914   PRN Meds:.acetaminophen, nitroGLYCERIN, ondansetron (ZOFRAN) IV  Assessment/Plan: Principal Problem:   Stable angina Active Problems:   Chest pain   Acute renal failure superimposed on stage 3 chronic kidney disease   Headache   Essential hypertension, benign   Weight loss, unintentional   Current smoker   Alcohol use   Orthostatic hypotension   DOE (dyspnea on exertion)   Vitamin B12 deficiency  This is hospital day #3 for Mr. Lorenson, a 73 year-old with a PMH of hypertension, alcohol abuse, chronic kidney disease, and macrocytic anemia who presented to the ED after waking up on Sunday with a headache. The day before he had been working in his yard until he started to feel dizzy, he then went inside to lay down, fell asleep, and didn't wake up until the next morning. This seemed odd to him as he typically doesn't sleep that late. On waking, he noticed a sharp headache that he rated a 5/10, nausea, blurry vision in his right eye, and chest pain that he described as a "bloated" feeling. All of these symptoms resolved after about the 1-2 hours. Of note, he also reported some dyspnea on exertion and chest pain,  as well as frequent leg cramps that occur when walking short distance. This has been going on for about 3 years. Important to note, Mr. Farwell does not have a PCP. After initial evaluation, we will work with him to establish follow up in the outpatient setting.   Hyperkalemia: Up to 5.7 from 4.8 the following evening. Ordered stat EKG, findings were consistent with previous EKG's. Otherwise, no new findings. Checked fluids to confirm IV rehydration was not source of increased potassium.  --Will recheck bmet in the morning, if continues up-trend will administer calcium gluconate  for hyperkalemia.  Stable Angina: Ordered ECHO, LVEF was found to be 55-60%. Diastolic dysfunction (grade 1) was noted, some trivial regurg as well. Otherwise as no abnormalities. Patient to undergo Myoview, will reassess following results.  Dehydration: BUN and creatinine down from yesterday, currently 29 and 1.43 respectively (previous: BUN, 47 and Creatinine, 1.83). Will continue to give IV fluids (100 cc NS). Will recheck BMET and reassess in the morning.  Wide-based gait, unsteadiness: Given patient has poor nutrition, has unexplained weight loss, as well as a history of alcoholism Folate and vitamin B12 levels were checked. Folate was normal, B12 was 253. According to uptodate levels in the 200-300 range could present with multiple neurological symptoms (like sensation, weakness, and unsteadiness). IM B12 injection was given. Will also check methylmalonic acid levels as these can be elevated in those with B12 deficiency.  Leg cramps: Possibly due to dehydration and exhaustion, but given the patient has several risk factors for PVD we will order ABI's and reassess after.  Hypertension: BP medications withheld until dehydration is resolved. Patient's creatinine and BUN still elevated, will continue to hydrate and once resolved, patient will be placed on Coreg 3.25 mg and restarted on Norvasc 5 mg. Thiazides could also benefit the  patient, this will be addressed in the outpatient setting.  Acute on Chronic Kidney Disease: BUN and creatinine are still elevated as mentioned above. Will continue to hydrate with IV fluids. Recheck BMET in the morning.  Weight loss (unintentional) and poor dentition: Patient lacks any upper teeth and has poor nutrition in general as he's very thin. Will provide patient with a list of resources that may help with this as well as set up a dental evaluation in the outpatient setting. Will also schedule follow up in outpatient clinic in 1-2 weeks.   This is a Careers information officer Note.  The care of the patient was discussed with Dr. Hulen Luster and the assessment and plan formulated with their assistance.  Please see their attached note for official documentation of the daily encounter.   LOS: 2 days   Gar Gibbon, Med Student 06/28/2014, 11:47 AM

## 2014-06-28 NOTE — Progress Notes (Addendum)
Subjective: Pt complaining of headache and would like coffee. Denies chest pain, chest palpitations, and SOB. Pt had lexiscan completed this morning.   Objective: Vital signs in last 24 hours: Filed Vitals:   06/27/14 1415 06/27/14 2016 06/28/14 0455 06/28/14 0506  BP: 132/66 149/73 174/78 145/85  Pulse: 80 79 104   Temp: 98.2 F (36.8 C) 98.5 F (36.9 C) 97.7 F (36.5 C)   TempSrc: Oral Oral Oral   Resp: 18 18 18    Height:      Weight:   150 lb (68.04 kg)   SpO2:  98% 100%    Weight change: 11 lb 3.2 oz (5.08 kg)  Intake/Output Summary (Last 24 hours) at 06/28/14 0810 Last data filed at 06/28/14 0500  Gross per 24 hour  Intake 2898.33 ml  Output   1400 ml  Net 1498.33 ml   General appearance: alert, cooperative and no distress  Head: Normocephalic, without obvious abnormality, atraumatic  Eyes: PERRL Throat: poor dental hygeine, missing all upper palate teeth Lungs: clear to auscultation bilaterally  Heart: regular rate and rhythm, S1, S2 normal, no murmur, click, rub or gallop  Abdomen: soft, non-tender; bowel sounds normal; no masses, no organomegaly  Pulses: 2+ and symmetric  Neuro: CN 2-12 grossly intact  Lab Results: Basic Metabolic Panel:  Recent Labs Lab 06/27/14 06/28/14 0600  NA 140 141  K 4.8 5.7*  CL 107 111  CO2 18* 19  GLUCOSE 82 88  BUN 47* 29*  CREATININE 1.83* 1.43*  CALCIUM 8.3* 8.3*   CBC:  Recent Labs Lab 06/26/14 0013 06/26/14 1153  WBC 6.9  --   HGB 12.3* 14.3  HCT 37.5* 42.0  MCV 102.2*  --   PLT 232  --    Cardiac Enzymes:  Recent Labs Lab 06/26/14 1146 06/26/14 1815 06/26/14 2310  CKTOTAL 119  --   --   TROPONINI <0.30 <0.30 <0.30   Hemoglobin A1C:  Recent Labs Lab 06/26/14 1815  HGBA1C 5.0   Fasting Lipid Panel:  Recent Labs Lab 06/26/14 1815  CHOL 142  HDL 66  LDLCALC 54  TRIG 112  CHOLHDL 2.2   Thyroid Function Tests:  Recent Labs Lab 06/26/14 1815  TSH 0.872   Anemia Panel:  Recent  Labs Lab 06/26/14 1815  VITAMINB12 253   Urinalysis:  Recent Labs Lab 06/26/14 1552  COLORURINE YELLOW  LABSPEC 1.021  PHURINE 5.0  GLUCOSEU NEGATIVE  HGBUR NEGATIVE  BILIRUBINUR SMALL*  KETONESUR NEGATIVE  PROTEINUR 30*  UROBILINOGEN 1.0  NITRITE NEGATIVE  LEUKOCYTESUR SMALL*    Studies/Results: Dg Chest 2 View  06/27/2014   CLINICAL DATA:  Forty top L weight loss, headache  EXAM: CHEST  2 VIEW  COMPARISON:  Chest x-ray of 06/26/2014  FINDINGS: The lungs are clear and slightly hyperaerated. Probable symmetrical nipple shadows are noted at both lung bases. Mediastinal and hilar contours are unremarkable. The heart is within normal limits in size. No bony abnormality is seen.  IMPRESSION: Slight hyper aeration. No active lung disease. Probable nipple shadows symmetrically at the lung bases.   Electronically Signed   By: Ivar Drape M.D.   On: 06/27/2014 09:24   Dg Chest Port 1 View  06/26/2014   CLINICAL DATA:  Chest pain  EXAM: PORTABLE CHEST - 1 VIEW  COMPARISON:  02/27/2012  FINDINGS: Cardiac shadow is within normal limits. Bilateral nipple shadows are noted. The lungs are clear bilaterally. No acute bony abnormality is seen. An old left rib fracture is again  noted.  IMPRESSION: No acute abnormality noted.   Electronically Signed   By: Inez Catalina M.D.   On: 06/26/2014 12:35   Medications: I have reviewed the patient's current medications. Scheduled Meds: . aspirin EC  81 mg Oral Daily  . atorvastatin  80 mg Oral q1800  . carvedilol  3.125 mg Oral BID WC  . cyanocobalamin  1,000 mcg Intramuscular Once  . feeding supplement (ENSURE COMPLETE)  237 mL Oral TID BM  . heparin  5,000 Units Subcutaneous 3 times per day  . nicotine  14 mg Transdermal Daily   Continuous Infusions: . sodium chloride 100 mL/hr at 06/27/14 1914   PRN Meds:.acetaminophen, nitroGLYCERIN, ondansetron (ZOFRAN) IV Assessment/Plan: Principal Problem:   Chest pain Active Problems:   Acute renal  failure superimposed on stage 3 chronic kidney disease   Headache   Essential hypertension, benign   Weight loss, unintentional   Current smoker   Alcohol use   Orthostatic hypotension   DOE (dyspnea on exertion)   Stable angina   Vitamin B12 deficiency  Dehydration - likely 2/2 to inadequate hydration and presumed heat exhaustion, also w/ ?EtOH use as pt has hx of EtOH abuse. Pt last ate Saturday morning reports recent 45lbs weight loss w/in 3 months due to lack of appetite and lack of teeth. Also pt reports early satiety. Pt had positive orthostatic vital signs- 185/85, HR 71 lying down and 136/76, HR 86 standing up. BUN 43 and Cr 2.20  -BUN anc Cr still elevated today however trending down. Will continue IVF -  IVF at 200cc/hr changed to 100cc/hr since he is no longer having orthostatic hypotension today (133/71, HR82 lying down, 139/64, HR86 standing)    Stable angina. Pt complains of chest pain on exertion that goes away with rest. Negative CXR. Pt also having orthopnea.  - troponins x 3 negative   - asp 81 - will start low dose statin  - will start coreg 3.125mg  BID today, held yesterday for lexiscan today  - lipid panel WNL  - A1c 5.0 -TSH WNL (0.872) - ECHO yesterday, EF 55-60%, trivial regurgitation, normal wall motion  - Lexiscan done today, results pending.   Hyperkalemia- - today K+ was 5.7 vs 4.8 yesterday. Stat EKG showed PR interval of 116 w/ peaked T waves that were actually smaller in V1 and V2 than previous EKGs.  - increase in K+ could be due to hemolysis during blood drawl.   HTN-  - will hold off on starting HTN meds at this time as pt is dehydrated w/ orthostatic hypotension. Pt has previously been on norvasc 5mg  w/ rapid improvement of BPs. However thiazides would be the most beneficial for this patient. BP currently 149/77. Will restart on norvasc 5mg  today and hold initiation of thiazide and ACEi for follow up at outpatient clinic.   Muscle cramping  -  although possibly due to dehydration will check ABIs in case of claudication, spoke w/ vascular u/s and ABI's will be done today.   Orthostatic hypotension- today BPs 133/71, HR82 lying down, 139/64, HR86 standing - resolved, will continue IVFs.   Unintentional weight loss- pt self reports a 40-45lb weight loss w/i 3-4 month period. This could be due to lack of appetite, difficulty eating food w/ missing upper teeth, or a malignancy.  - pt has long hx of smoking, UA was negative for gross hematuria - repeat CXR reveals a widened mediastinum that is unchanged from CXR on 02/2012.  - pt having chronic headaches however  they do no wake him up from sleep and due to chronic nature not concerning for brain mets. Current HA's likely due to caffeine w/d.  Lightheadedness- Possibly due to elevated BPs. On admission PE pt had decreased sensation on lt side of body although he reports having rt arm numbness. Also was unsteady during romberg test. Pt may have had a stroke in the past w/ elevated BPs as the inciting event with resolution. Held off on CT head as would not change management.  - Fall precautions   Acute on chronic CKD- 2/2 pre-renal cause likely dehydration  - GFR 02/2012 was 48, admission GFR 35, today GFR is 54, likely improving w/ fluids.   Macrocytic anemia - pt has hx of EtOH abuse  - folate WNL at 328 - pt received B12 IM injection yesterday - will check homocysteine and methylmalonic levels considering B12( 253) on the  low side of normal, neuro findings, and wide based gait it is possible that he does have a true B12 deficiency   Tobacco abuse - nicotine patch 14mg  daily   Diet  - soft diet   DVT  - heparin [place]  Dispo: Disposition is deferred at this time, awaiting improvement of current medical problems.  Possible discharge today pending lexiscan results. Will schedule hospital f/u at our outpatient clinic.   The patient does not have a current PCP (Provider Default, MD) and  does need an [facility] hospital follow-up appointment after discharge.       LOS: 2 days   Julious Oka, MD 06/28/2014, 8:10 AM

## 2014-06-28 NOTE — Progress Notes (Signed)
Patient returned from stress test in wheelchair accompanied by transportation.  No s/s of distress noted throughout.  Needs attended to.  Will continue to monitor.  Dirk Dress 06/28/2014 1:41 PM

## 2014-06-28 NOTE — Progress Notes (Signed)
VASCULAR LAB PRELIMINARY  ARTERIAL  ABI completed:    RIGHT    LEFT    PRESSURE WAVEFORM  PRESSURE WAVEFORM  BRACHIAL 157 Triphasic BRACHIAL 165 Triphasic  DP 114 Dampened monophasic DP 157 Biphasic  PT 117 Dampened monophasic PT 188 Triphasic    RIGHT LEFT  ABI 0.71 1.14   ABIs indicate a moderate reduction in arterial flow on the right and normal flow on the left.  Duplex scan of the right lower extremity revealed mild to moderate heterogeneous plaque throughout. There appears to be a greater than 50% stenosis in the mid to distal region of the superficial femoral artery. Diminished Doppler waveforms and velocities are noted in the lower leg consistent with tibial vessel disease.  Derrick Hendrix, RVS 06/28/2014, 1:17 PM

## 2014-06-28 NOTE — Care Management (Signed)
[place], RN,BSN [phone] CM placed information in f/u section for [facility]. Pt needs to dial [phone] and they will assist with resources. No further needs from CM at this time. Bethena Roys, RN,BSN [phone]

## 2014-06-28 NOTE — Progress Notes (Signed)
Lexiscan myoview completed without complications, nuc results to follow.

## 2014-06-29 DIAGNOSIS — I739 Peripheral vascular disease, unspecified: Secondary | ICD-10-CM

## 2014-06-29 DIAGNOSIS — I70219 Atherosclerosis of native arteries of extremities with intermittent claudication, unspecified extremity: Secondary | ICD-10-CM | POA: Diagnosis present

## 2014-06-29 LAB — BASIC METABOLIC PANEL
ANION GAP: 13 (ref 5–15)
BUN: 23 mg/dL (ref 6–23)
CO2: 19 mEq/L (ref 19–32)
Calcium: 8.3 mg/dL — ABNORMAL LOW (ref 8.4–10.5)
Chloride: 109 mEq/L (ref 96–112)
Creatinine, Ser: 1.33 mg/dL (ref 0.50–1.35)
GFR calc Af Amer: 59 mL/min — ABNORMAL LOW (ref >90)
GFR, EST NON AFRICAN AMERICAN: 51 mL/min — AB (ref >90)
Glucose, Bld: 91 mg/dL (ref 70–99)
POTASSIUM: 5.2 meq/L (ref 3.7–5.3)
Sodium: 141 mEq/L (ref 137–147)

## 2014-06-29 MED ORDER — CILOSTAZOL 100 MG PO TABS: 100.0000 mg | ORAL_TABLET | Freq: Two times a day (BID) | ORAL | Status: DC

## 2014-06-29 MED ORDER — NICOTINE 14 MG/24HR TD PT24: 14.0000 mg | MEDICATED_PATCH | Freq: Every day | TRANSDERMAL | Status: DC

## 2014-06-29 MED ORDER — ENSURE COMPLETE PO LIQD: 237.0000 mL | Freq: Three times a day (TID) | ORAL | Status: DC

## 2014-06-29 MED ORDER — AMLODIPINE BESYLATE 5 MG PO TABS: 5.0000 mg | ORAL_TABLET | Freq: Every day | ORAL | Status: DC

## 2014-06-29 MED ORDER — VITAMIN B-12 1000 MCG PO TABS: 1000.0000 ug | ORAL_TABLET | Freq: Every day | ORAL | Status: DC

## 2014-06-29 MED ORDER — ASPIRIN 81 MG PO TBEC: 81.0000 mg | DELAYED_RELEASE_TABLET | Freq: Every day | ORAL | Status: DC

## 2014-06-29 MED ORDER — CILOSTAZOL 100 MG PO TABS
100.0000 mg | ORAL_TABLET | Freq: Two times a day (BID) | ORAL | Status: DC
  Administered 2014-06-29: 100 mg via ORAL
  Filled 2014-06-29 (×2): qty 1

## 2014-06-29 MED ORDER — CARVEDILOL 3.125 MG PO TABS: 3.1250 mg | ORAL_TABLET | Freq: Two times a day (BID) | ORAL | Status: DC

## 2014-06-29 MED ORDER — NITROGLYCERIN 0.4 MG SL SUBL: 0.4000 mg | SUBLINGUAL_TABLET | SUBLINGUAL | Status: DC | PRN

## 2014-06-29 MED ORDER — ATORVASTATIN CALCIUM 80 MG PO TABS: 80.0000 mg | ORAL_TABLET | Freq: Every day | ORAL | Status: DC

## 2014-06-29 NOTE — Discharge Instructions (Addendum)
Angina Pectoris Angina pectoris is extreme discomfort in your chest, neck, or arm. Your doctor may call it just angina. It is caused by a lack of oxygen to your heart wall. It may feel like tightness or heavy pressure. It may feel like a crushing or squeezing pain. Some people say it feels like gas. It may go down your shoulders, back, and arms. Some people have symptoms other than pain. These include:  Tiredness.  Shortness of breath.  Cold sweats.  Feeling sick to your stomach (nausea). There are four types of angina:  Stable angina. This type often lasts the same amount of time each time it happens. Activity, stress, or excitement can bring it on. It often gets better after taking a medicine called nitroglycerin. This goes under your tongue.  Unstable angina. This type can happen when you are not active or even during sleep. It can suddenly get worse or happen more often. It may not get better after taking the special medicine. It can last up to 30 minutes.  Microvascular angina. This type is more common in women. It may be more severe or last longer than other types.  Prinzmetal angina. This type often happens when you are not active or in the early morning hours. HOME CARE   Only take medicines as told by your doctor.  Stay active or exercise more as told by your doctor.  Limit very hard activity as told by your doctor.  Limit heavy lifting as told by your doctor.  Keep a healthy weight.  Learn about and eat foods that are healthy for your heart.  Do not use any tobacco such as cigarettes, chewing tobacco, or e-cigarettes. GET HELP RIGHT AWAY IF:   You have chest, neck, deep shoulder, or arm pain or discomfort that lasts more than a few minutes.  You have chest, neck, deep shoulder, or arm pain or discomfort that goes away and comes back over and over again.  You have heavy sweating that seems to happen for no reason.  You have shortness of breath or trouble  breathing.  Your angina does not get better after a few minutes of rest.  Your angina does not get better after you take nitroglycerin medicine. These can all be symptoms of a heart attack. Get help right away. Call your local emergency service (911 in [place] Do not  drive yourself to the hospital. Do not  wait to for your symptoms to go away. MAKE SURE YOU:   Understand these instructions.  Will watch your condition.  Will get help right away if you are not doing well or get worse. Document Released: 05/19/2008 Document Revised: 12/06/2013 Document Reviewed: 09/09/2012 [facility] Patient Information 2015 [place], [place]. This information is not intended to replace advice given to you by your health care provider. Make sure you discuss any questions you have with your health care provider. Dehydration, Adult Dehydration means your body does not have as much fluid as it needs. Your kidneys, brain, and heart will not work properly without the right amount of fluids and salt.  HOME CARE  Ask your doctor how to replace body fluid losses (rehydrate).  Drink enough fluids to keep your pee (urine) clear or pale yellow.  Drink small amounts of fluids often if you feel sick to your stomach (nauseous) or throw up (vomit).  Eat like you normally do.  Avoid:  Foods or drinks high in sugar.  Bubbly (carbonated) drinks.  Juice.  Very hot or cold fluids.  Drinks with caffeine.  Fatty, greasy foods.  Alcohol.  Tobacco.  Eating too much.  Gelatin desserts.  Wash your hands to avoid spreading germs (bacteria, viruses).  Only take medicine as told by your doctor.  Keep all doctor visits as told. GET HELP RIGHT AWAY IF:   You cannot drink something without throwing up.  You get worse even with treatment.  Your vomit has blood in it or looks greenish.  Your poop (stool) has blood in it or looks black and tarry.  You have not peed in 6 to 8 hours.  You pee a small amount of  very dark pee.  You have a fever.  You pass out (faint).  You have belly (abdominal) pain that gets worse or stays in one spot (localizes).  You have a rash, stiff neck, or bad headache.  You get easily annoyed, sleepy, or are hard to wake up.  You feel weak, dizzy, or very thirsty. MAKE SURE YOU:   Understand these instructions.  Will watch your condition.  Will get help right away if you are not doing well or get worse. Document Released: 09/27/2009 Document Revised: 02/23/2012 Document Reviewed: 07/21/2011 [facility] Patient Information 2015 [place], [place]. This information is not intended to replace advice given to you by your health care provider. Make sure you discuss any questions you have with your health care provider. Hypertension Hypertension is another name for high blood pressure. High blood pressure forces your heart to work harder to pump blood. A blood pressure reading has two numbers, which includes a higher number over a lower number (example: 110/72). HOME CARE   Have your blood pressure rechecked by your doctor.  Only take medicine as told by your doctor. Follow the directions carefully. The medicine does not work as well if you skip doses. Skipping doses also puts you at risk for problems.  Do not smoke.  Monitor your blood pressure at home as told by your doctor. GET HELP IF:  You think you are having a reaction to the medicine you are taking.  You have repeat headaches or feel dizzy.  You have puffiness (swelling) in your ankles.  You have trouble with your vision. GET HELP RIGHT AWAY IF:   You get a very bad headache and are confused.  You feel weak, numb, or faint.  You get chest or belly (abdominal) pain.  You throw up (vomit).  You cannot breathe very well. MAKE SURE YOU:   Understand these instructions.  Will watch your condition.  Will get help right away if you are not doing well or get worse. Document Released: 05/19/2008  Document Revised: 12/06/2013 Document Reviewed: 09/23/2013 [facility] Patient Information 2015 [place], [place]. This information is not intended to replace advice given to you by your health care provider. Make sure you discuss any questions you have with your health care provider.   Peripheral Vascular Disease  Peripheral vascular disease (PVD) is caused by cholesterol buildup in the arteries. The arteries become narrow or clogged. This makes it hard for blood to flow. It happens most in the legs, but it can occur in other areas of your body. HOME CARE   Quit smoking, if you smoke.  Exercise as told by your doctor.  Follow a low-fat, low-cholesterol diet as told by your doctor.  Control your diabetes, if you have diabetes.  Care for your feet to prevent infection.  Only take medicine as told by your doctor. GET HELP RIGHT AWAY IF:   You have  pain or lose feeling (numbness) in your arms or legs.  Your arms or legs turn cold or blue.  You have redness, warmth, and puffiness (swelling) in your arms or legs. MAKE SURE YOU:   Understand these instructions.  Will watch your condition.  Will get help right away if you are not doing well or get worse. Document Released: 02/25/2010 Document Revised: 02/23/2012 Document Reviewed: 02/25/2010 [facility] Patient Information 2015 [place], [place]. This information is not intended to replace advice given to you by your health care provider. Make sure you discuss any questions you have with your health care provider.  Cardiac Diet This diet can help prevent heart disease and stroke. Many factors influence your heart health, including eating and exercise habits. Coronary risk rises a lot with abnormal blood fat (lipid) levels. Cardiac meal planning includes limiting unhealthy fats, increasing healthy fats, and making other small dietary changes. General guidelines are as follows:  Adjust calorie intake to reach and maintain desirable body  weight.  Limit total fat intake to less than 30% of total calories. Saturated fat should be less than 7% of calories.  Saturated fats are found in animal products and in some vegetable products. Saturated vegetable fats are found in coconut oil, cocoa butter, palm oil, and palm kernel oil. Read labels carefully to avoid these products as much as possible. Use butter in moderation. Choose tub margarines and oils that have 2 grams of fat or less. Good cooking oils are canola and olive oils.  Practice low-fat cooking techniques. Do not fry food. Instead, broil, bake, boil, steam, grill, roast on a rack, stir-fry, or microwave it. Other fat reducing suggestions include:  Remove the skin from poultry.  Remove all visible fat from meats.  Skim the fat off stews, soups, and gravies before serving them.  Steam vegetables in water or broth instead of sauting them in fat.  Avoid foods with trans fat (or hydrogenated oils), such as commercially fried foods and commercially baked goods. Commercial shortening and deep-frying fats will contain trans fat.  Increase intake of fruits, vegetables, whole grains, and legumes to replace foods high in fat.  Increase consumption of nuts, legumes, and seeds to at least 4 servings weekly. One serving of a legume equals  cup, and 1 serving of nuts or seeds equals  cup.  Choose whole grains more often. Have 3 servings per day (a serving is 1 ounce [oz]).  Eat 4 to 5 servings of vegetables per day. A serving of vegetables is 1 cup of raw leafy vegetables;  cup of raw or cooked cut-up vegetables;  cup of vegetable juice.  Eat 4 to 5 servings of fruit per day. A serving of fruit is 1 medium whole fruit;  cup of dried fruit;  cup of fresh, frozen, or canned fruit;  cup of 100% fruit juice.  Increase your intake of dietary fiber to 20 to 30 grams per day. Insoluble fiber may help lower your risk of heart disease and may help curb your appetite.  Soluble fiber  binds cholesterol to be removed from the blood. Foods high in soluble fiber are dried beans, citrus fruits, oats, apples, bananas, broccoli, Brussels sprouts, and eggplant.  Try to include foods fortified with plant sterols or stanols, such as yogurt, breads, juices, or margarines. Choose several fortified foods to achieve a daily intake of 2 to 3 grams of plant sterols or stanols.  Foods with omega-3 fats can help reduce your risk of heart disease. Aim to have  a 3.5 oz portion of fatty fish twice per week, such as salmon, mackerel, albacore tuna, sardines, lake trout, or herring. If you wish to take a fish oil supplement, choose one that contains 1 gram of both DHA and EPA.  Limit processed meats to 2 servings (3 oz portion) weekly.  Limit the sodium in your diet to 1500 milligrams (mg) per day. If you have high blood pressure, talk to a registered dietitian about a DASH (Dietary Approaches to Stop Hypertension) eating plan.  Limit sweets and beverages with added sugar, such as soda, to no more than 5 servings per week. One serving is:   1 tablespoon sugar.  1 tablespoon jelly or jam.   cup sorbet.  1 cup lemonade.   cup regular soda. CHOOSING FOODS Starches  Allowed: Breads: All kinds (wheat, rye, raisin, white, oatmeal, [place], [place], and English muffin bread). Low-fat rolls: English muffins, frankfurter and hamburger buns, bagels, pita bread, tortillas (not fried). Pancakes, waffles, biscuits, and muffins made with recommended oil.  Avoid: Products made with saturated or trans fats, oils, or whole milk products. Butter rolls, cheese breads, croissants. Commercial doughnuts, muffins, sweet rolls, biscuits, waffles, pancakes, store-bought mixes. Crackers  Allowed: Low-fat crackers and snacks: Animal, graham, rye, saltine (with recommended oil, no lard), oyster, and matzo crackers. Bread sticks, melba toast, rusks, flatbread, pretzels, and light popcorn.  Avoid: High-fat  crackers: cheese crackers, butter crackers, and those made with coconut, palm oil, or trans fat (hydrogenated oils). Buttered popcorn. Cereals  Allowed: Hot or cold whole-grain cereals.  Avoid: Cereals containing coconut, hydrogenated vegetable fat, or animal fat. Potatoes / Pasta / Rice  Allowed: All kinds of potatoes, rice, and pasta (such as macaroni, spaghetti, and noodles).  Avoid: Pasta or rice prepared with cream sauce or high-fat cheese. Chow mein noodles, [place] fries. Vegetables  Allowed: All vegetables and vegetable juices.  Avoid: Fried vegetables. Vegetables in cream, butter, or high-fat cheese sauces. Limit coconut. Fruit in cream or custard. Protein  Allowed: Limit your intake of meat, seafood, and poultry to no more than 6 oz (cooked weight) per day. All lean, well-trimmed beef, veal, pork, and lamb. All chicken and [place] without skin. All fish and shellfish. Wild game: wild duck, rabbit, pheasant, and venison. Egg whites or low-cholesterol egg substitutes may be used as desired. Meatless dishes: recipes with dried beans, peas, lentils, and tofu (soybean curd). Seeds and nuts: all seeds and most nuts.  Avoid: Prime grade and other heavily marbled and fatty meats, such as short ribs, spare ribs, rib eye roast or steak, frankfurters, sausage, bacon, and high-fat luncheon meats, mutton. Caviar. Commercially fried fish. Domestic duck, goose, venison sausage. Organ meats: liver, gizzard, heart, chitterlings, brains, kidney, sweetbreads. Dairy  Allowed: Low-fat cheeses: nonfat or low-fat cottage cheese (1% or 2% fat), cheeses made with part skim milk, such as mozzarella, farmers, string, or ricotta. (Cheeses should be labeled no more than 2 to 6 grams fat per oz.). Skim (or 1%) milk: liquid, powdered, or evaporated. Buttermilk made with low-fat milk. Drinks made with skim or low-fat milk or cocoa. Chocolate milk or cocoa made with skim or low-fat (1%) milk. Nonfat or low-fat  yogurt.  Avoid: Whole milk cheeses, including colby, cheddar, [place], Monterey Jack, [place], [place], [place], American, Swiss, and blue. Creamed cottage cheese, cream cheese. Whole milk and whole milk products, including buttermilk or yogurt made from whole milk, drinks made from whole milk. Condensed milk, evaporated whole milk, and 2% milk. Soups and Combination Foods  Allowed:  Low-fat low-sodium soups: broth, dehydrated soups, homemade broth, soups with the fat removed, homemade cream soups made with skim or low-fat milk. Low-fat spaghetti, lasagna, chili, and Spanish rice if low-fat ingredients and low-fat cooking techniques are used.  Avoid: Cream soups made with whole milk, cream, or high-fat cheese. All other soups. Desserts and Sweets  Allowed: Sherbet, fruit ices, gelatins, meringues, and angel food cake. Homemade desserts with recommended fats, oils, and milk products. Jam, jelly, honey, marmalade, sugars, and syrups. Pure sugar candy, such as gum drops, hard candy, jelly beans, marshmallows, mints, and small amounts of dark chocolate.  Avoid: Commercially prepared cakes, pies, cookies, frosting, pudding, or mixes for these products. Desserts containing whole milk products, chocolate, coconut, lard, palm oil, or palm kernel oil. Ice cream or ice cream drinks. Candy that contains chocolate, coconut, butter, hydrogenated fat, or unknown ingredients. Buttered syrups. Fats and Oils  Allowed: Vegetable oils: safflower, sunflower, corn, soybean, cottonseed, sesame, canola, olive, or peanut. Non-hydrogenated margarines. Salad dressing or mayonnaise: homemade or commercial, made with a recommended oil. Low or nonfat salad dressing or mayonnaise.  Limit added fats and oils to 6 to 8 tsp per day (includes fats used in cooking, baking, salads, and spreads on bread). Remember to count the "hidden fats" in foods.  Avoid: Solid fats and shortenings: butter, lard, salt pork, bacon drippings. Gravy  containing meat fat, shortening, or suet. Cocoa butter, coconut. Coconut oil, palm oil, palm kernel oil, or hydrogenated oils: these ingredients are often used in bakery products, nondairy creamers, whipped toppings, candy, and commercially fried foods. Read labels carefully. Salad dressings made of unknown oils, sour cream, or cheese, such as blue cheese and [place]. Cream, all kinds: half-and-half, light, heavy, or whipping. Sour cream or cream cheese (even if "light" or low-fat). Nondairy cream substitutes: coffee creamers and sour cream substitutes made with palm, palm kernel, hydrogenated oils, or coconut oil. Beverages  Allowed: Coffee (regular or decaffeinated), tea. Diet carbonated beverages, mineral water. Alcohol: Check with your caregiver. Moderation is recommended.  Avoid: Whole milk, regular sodas, and juice drinks with added sugar. Condiments  Allowed: All seasonings and condiments. Cocoa powder. "Cream" sauces made with recommended ingredients.  Avoid: Carob powder made with hydrogenated fats. SAMPLE MENU Breakfast   cup orange juice   cup oatmeal  1 slice toast  1 tsp margarine  1 cup skim milk Lunch  [place] sandwich with 2 oz [place], 2 slices bread  Lettuce and tomato slices  Fresh fruit  Carrot sticks  Coffee or tea Snack  Fresh fruit or low-fat crackers Dinner  3 oz lean ground beef  1 baked potato  1 tsp margarine   cup asparagus  Lettuce salad  1 tbs non-creamy dressing   cup peach slices  1 cup skim milk Document Released: 09/09/2008 Document Revised: 06/01/2012 Document Reviewed: 02/24/2012 [employer] Patient Information 2015 [place], [place]. This information is not intended to replace advice given to you by your health care provider. Make sure you discuss any questions you have with your health care provider.

## 2014-06-29 NOTE — Progress Notes (Signed)
Internal Medicine Attending  Date: 06/29/2014  Patient name: Derrick Hendrix Medical record number: [id number] Date of birth: 1941/10/05 Age: 73 y.o. Gender: male  I saw and evaluated the patient. I developed the assessment and plan with the housestaff and reviewed the resident's note by Dr. Hulen Luster.  I agree with the resident's findings and plans as documented in her progress note.  He was feeling much better this morning when seen on rounds. His only complaint was some discomfort in the right foot. Examination revealed some mild swelling in the right foot. His creatinine continued to improve with IV hydration and he now has stage II chronic kidney disease. I agree with the plan to discharge home with followup in the internal medicine center to manage his chronic medical problems. Prior to discharge he will be started on cilostazol for his claudication.

## 2014-06-29 NOTE — Progress Notes (Signed)
Subjective: Pt feeling a lot better, no longer having a headache. Complaining of swelling in rt calf and ankle. Pt had a heat pack on it this morning that has helped w/ swelling. Pt feeling heavy sensation in his feet. Pt has been able to ambulate to restroom w/o any complications.   Objective: Vital signs in last 24 hours: Filed Vitals:   06/28/14 1416 06/28/14 2129 06/29/14 0512 06/29/14 0846  BP:  137/66 150/69 147/56  Pulse: 76 78 77 76  Temp:  98.1 F (36.7 C) 98.3 F (36.8 C)   TempSrc:  Oral Oral   Resp:  16 16   Height:      Weight:      SpO2:  100% 100%    Weight change:   Intake/Output Summary (Last 24 hours) at 06/29/14 1114 Last data filed at 06/29/14 0857  Gross per 24 hour  Intake   1860 ml  Output   1500 ml  Net    360 ml   General appearance: alert, cooperative and no distress  Head: Normocephalic, without obvious abnormality, atraumatic  Eyes: PERRL Throat: poor dental hygeine, missing all upper palate teeth Lungs: clear to auscultation bilaterally  Heart: regular rate and rhythm, S1, S2 normal, no murmur, click, rub or gallop  Abdomen: soft, non-tender; bowel sounds normal; no masses, no organomegaly  Neuro: CN 2-12 grossly intact Ext: negative homan's sign on rt, mildly tender to rt calf palpation, rt calf minimally increased in size compared to left, rt leg non erythematous.   Lab Results: Basic Metabolic Panel:  Recent Labs Lab 06/28/14 0600 06/28/14 1346  NA 141 138  K 5.7* 5.0  CL 111 104  CO2 19 19  GLUCOSE 88 78  BUN 29* 24*  CREATININE 1.43* 1.24  CALCIUM 8.3* 8.6   CBC:  Recent Labs Lab 06/26/14 0013 06/26/14 1153  WBC 6.9  --   HGB 12.3* 14.3  HCT 37.5* 42.0  MCV 102.2*  --   PLT 232  --    Cardiac Enzymes:  Recent Labs Lab 06/26/14 1146 06/26/14 1815 06/26/14 2310  CKTOTAL 119  --   --   TROPONINI <0.30 <0.30 <0.30   Hemoglobin A1C:  Recent Labs Lab 06/26/14 1815  HGBA1C 5.0   Fasting Lipid  Panel:  Recent Labs Lab 06/26/14 1815  CHOL 142  HDL 66  LDLCALC 54  TRIG 112  CHOLHDL 2.2   Thyroid Function Tests:  Recent Labs Lab 06/26/14 1815  TSH 0.872   Anemia Panel:  Recent Labs Lab 06/26/14 1815  VITAMINB12 253   Urinalysis:  Recent Labs Lab 06/26/14 1552  COLORURINE YELLOW  LABSPEC 1.021  PHURINE 5.0  GLUCOSEU NEGATIVE  HGBUR NEGATIVE  BILIRUBINUR SMALL*  KETONESUR NEGATIVE  PROTEINUR 30*  UROBILINOGEN 1.0  NITRITE NEGATIVE  LEUKOCYTESUR SMALL*    Studies/Results: Nm Myocar Multi W/spect W/wall Motion / Ef  06/28/2014   CLINICAL DATA:  Chest pain  EXAM: MYOCARDIAL IMAGING WITH SPECT (REST AND PHARMACOLOGIC-STRESS - 2 DAY PROTOCOL)  GATED LEFT VENTRICULAR WALL MOTION STUDY  LEFT VENTRICULAR EJECTION FRACTION  TECHNIQUE: Standard myocardial SPECT imaging was performed after resting intravenous injection of 10 mCi Tc-69m sestamibi. Subsequently, on a second day, intravenous infusion of Lexiscan was performed under the supervision of the Cardiology staff. At peak effect of the drug, 30 mCi Tc-32m sestamibi was injected intravenously and standard myocardial SPECT imaging was performed. Quantitative gated imaging was also performed to evaluate left ventricular wall motion, and estimate left ventricular ejection  fraction.  COMPARISON:  Chest radiograph of 06/27/2014  FINDINGS: Myocardial perfusion study: Reduced inferior wall stress and rest activity compatible with a RCA distribution scar or less likely diaphragmatic attenuation. No inducible ischemia.  Ejection fraction calculation: End-diastolic volume is 72 mL. End systolic volume is 28 mL. Derived left ventricular ejection fraction of 61%.  Wall motion analysis: Left ventricular wall motion and wall thickening appear normal.  IMPRESSION: 1. Reduced activity in the inferior wall on stress and rest images, suspicious for scar. Diaphragmatic attenuation is less likely. No inducible ischemia. Ejection fraction  normal.   Electronically Signed   By: Sherryl Barters M.D.   On: 06/28/2014 15:13   Medications: I have reviewed the patient's current medications. Scheduled Meds: . aspirin EC  81 mg Oral Daily  . atorvastatin  80 mg Oral q1800  . carvedilol  3.125 mg Oral BID WC  . cilostazol  100 mg Oral BID  . feeding supplement (ENSURE COMPLETE)  237 mL Oral TID BM  . heparin  5,000 Units Subcutaneous 3 times per day  . nicotine  14 mg Transdermal Daily   Continuous Infusions: . sodium chloride 100 mL/hr at 06/29/14 0857   PRN Meds:.acetaminophen, nitroGLYCERIN, ondansetron (ZOFRAN) IV Assessment/Plan: Principal Problem:   Stable angina Active Problems:   Acute renal failure superimposed on stage 3 chronic kidney disease   Headache   Essential hypertension, benign   Weight loss, unintentional   Current smoker   Alcohol use   Orthostatic hypotension   DOE (dyspnea on exertion)   Vitamin B12 deficiency   Atherosclerosis of leg with intermittent claudication, R  Dehydration - likely 2/2 to inadequate hydration and presumed heat exhaustion, also w/ ?EtOH use as pt has hx of EtOH abuse. Pt last ate Saturday morning, reports recent 45lbs weight loss w/in 3 months due to lack of appetite and lack of teeth. Also pt reports early satiety. Pt had positive orthostatic vital signs on admission- 185/85, HR 71 lying down and 136/76, HR 86 standing up. BUN 43 and Cr 2.20  -BUN still elevated today however trending down -  No longer with orthostatic hypotension - will continue w/ PO liquids for hydration  Stable angina. Pt complains of chest pain on exertion that goes away with rest. Negative CXR. Pt also having orthopnea.  - troponins x 3 negative   - asp 81 - lipitor 80mg  -  coreg 3.125mg  BID  - lipid panel WNL  - A1c 5.0 -TSH WNL (0.872) - ECHO yesterday, EF 55-60%, trivial regurgitation, normal wall motion  - Lexiscan negative for inducible ischemia, reduced inf. Wall stress and rest activity  compatible w/ RCA distribution scar or less likely diaphragmatic attenuation.    Rt leg PVD w/ presumed PVD on lt leg- Recent ABI results reveal rt 0.71 and lt 1.14. Duplex scan of rt leg revealed multiple plaques and >50% stenosis in the mid-distal region of the superficial femoral artery as well as diminished doppler waveforms and velocities in the lower leg consistent w/ tibial disease. Positive RF include smoking.  -cilostazol 100mg  BID - stressed importance of smoking cessation, will prescribe nicotine patches on d/c and reassess progress at outpatient clinic  Hyperkalemia- resolved - K+ yesterday was 5.7 due to ?hemolysis of lab sample. Stat EKG unchanged and negative for widened QRS or peaked T waves. BMET rechecked and K+ found to be 5.0. Today, K+ is 5.0  HTN-  - initially held off on starting HTN meds due to dehydration and orthostatic hypotension. Pt has  previously been on norvasc 5mg  w/ rapid improvement of BPs. Will restart on norvasc 5mg  today and hold initiation of thiazide for follow up at outpatient clinic.    Orthostatic hypotension- today BPs 133/71, HR82 lying down, 139/64, HR86 standing - will d/c IVFs  Unintentional weight loss- pt self reports a 40-45lb weight loss w/i 3-4 month period. This could be due to lack of appetite, difficulty eating food w/ missing upper teeth, or a malignancy.  - pt has long hx of smoking, UA was negative for gross hematuria - repeat CXR reveals a widened mediastinum that is unchanged from CXR on 02/2012.  - pt having chronic headaches however they do no wake him up from sleep and due to chronic nature not concerning for brain mets. HA's likely due to caffeine w/d. - will monitor weights at future appointments at [facility] - case management provided pt with a list of community dental clinics in the area  Lightheadedness- Possibly due to elevated BPs. On admission PE pt had decreased sensation on lt side of body although he reports having rt arm  numbness. Also was unsteady during romberg test. Pt may have had a stroke in the past w/ elevated BPs as the inciting event with resolution. Held off on CT head as would not change management.  - Fall precautions   Acute on chronic CKD- 2/2 pre-renal cause likely dehydration  - GFR 02/2012 was 48, admission GFR 35, today GFR is 64 , likely improved w/ fluids  Macrocytic anemia - pt has hx of EtOH abuse  - folate WNL at 328 - pt received B12 IM injection yesterday - will check methylmalonic levels considering B12( 253) on the  low side of normal, neuro findings, and wide based gait it is possible that he does have a true B12 deficiency   Tobacco abuse - nicotine patch 14mg  daily - will d/c with rx for patches   Diet  - soft diet   DVT  - heparin [place]  Dispo: Disposition is deferred at this time, awaiting improvement of current medical problems.  Discharge today.   The patient does not have a current PCP (Provider Default, MD) and does need an [facility] hospital follow-up appointment after discharge.       LOS: 3 days   Julious Oka, MD 06/29/2014, 11:14 AM

## 2014-06-29 NOTE — Progress Notes (Signed)
Subjective: Overnight, patient slept well. No new complaints or concerns. At the time of our visit, he was up and walking to the bathroom. Denies any headaches, he believes caffeine has helped with this. Also seems very pleased with the nicotine patch. He does, however, mention some swelling in his right lower extremity that he describes as a "heaviness" along with some pain.  Nurse brought him a heating pad, which he says has helped. He states the swelling and pain have gone away considerably since he first noticed this morning.   Of note, his gait seemed slightly improved today, less unsteadiness when compared to previous days.   Objective: Vital signs in last 24 hours: Filed Vitals:   06/28/14 1416 06/28/14 2129 06/29/14 0512 06/29/14 0846  BP:  137/66 150/69 147/56  Pulse: 76 78 77 76  Temp:  98.1 F (36.7 C) 98.3 F (36.8 C)   TempSrc:  Oral Oral   Resp:  16 16   Height:      Weight:      SpO2:  100% 100%     Intake/Output Summary (Last 24 hours) at 06/29/14 1115 Last data filed at 06/29/14 0857  Gross per 24 hour  Intake   1860 ml  Output   1500 ml  Net    360 ml   Physical Exam:  General: Well appearing, thin body habitus. Alert and oriented (X3). In no acute distress.  Heart: RRR, S1 and S2 with no s3 or s4, no murmurs, rubs or gallops.  Lungs: Lungs clear to auscultation, no wheezes or crackles. Abdomen: normoactive bowel sounds, no bruits, non-tender to palpation, no  Extremities: No clubbing, or cyanosis. Trace right-lower extremity edema.    Skin: normal texture, normal turgor, warm, dry, no rash  Lab Results: Basic Metabolic Panel:  Recent Labs Lab 06/28/14 0600 06/28/14 1346  NA 141 138  K 5.7* 5.0  CL 111 104  CO2 19 19  GLUCOSE 88 78  BUN 29* 24*  CREATININE 1.43* 1.24  CALCIUM 8.3* 8.6   CBC:  Recent Labs Lab 06/26/14 0013 06/26/14 1153  WBC 6.9  --   HGB 12.3* 14.3  HCT 37.5* 42.0  MCV 102.2*  --   PLT 232  --    Cardiac  Enzymes:  Recent Labs Lab 06/26/14 1146 06/26/14 1815 06/26/14 2310  CKTOTAL 119  --   --   TROPONINI <0.30 <0.30 <0.30   Hemoglobin A1C:  Recent Labs Lab 06/26/14 1815  HGBA1C 5.0   Fasting Lipid Panel:  Recent Labs Lab 06/26/14 1815  CHOL 142  HDL 66  LDLCALC 54  TRIG 112  CHOLHDL 2.2   Thyroid Function Tests:  Recent Labs Lab 06/26/14 1815  TSH 0.872   Anemia Panel:  Recent Labs Lab 06/26/14 1815  VITAMINB12 253   Urine Drug Screen: Drugs of Abuse     Component Value Date/Time   LABOPIA NONE DETECTED 06/26/2014 1552   COCAINSCRNUR NONE DETECTED 06/26/2014 1552   LABBENZ NONE DETECTED 06/26/2014 1552   AMPHETMU NONE DETECTED 06/26/2014 1552   THCU NONE DETECTED 06/26/2014 1552   LABBARB NONE DETECTED 06/26/2014 1552    Urinalysis:  Recent Labs Lab 06/26/14 1552  COLORURINE YELLOW  LABSPEC 1.021  PHURINE 5.0  GLUCOSEU NEGATIVE  HGBUR NEGATIVE  BILIRUBINUR SMALL*  KETONESUR NEGATIVE  PROTEINUR 30*  UROBILINOGEN 1.0  NITRITE NEGATIVE  LEUKOCYTESUR SMALL*   Misc. Labs: ABI: moderate reduction in arterial flow on the right, ABI= 0.7. An ABI of 1.14 on the  left (supranormal suggesting non-compressibility)    Duplex scan of the right lower extremity:  revealed mild to moderate heterogeneous plaque throughout. There appears to be a greater than 50% stenosis in the mid to distal region of the superficial femoral artery. Diminished Doppler waveforms and velocities are noted in the lower leg consistent with tibial vessel disease.  Studies/Results: Nm Myocar Multi W/spect W/wall Motion / Ef  06/28/2014   CLINICAL DATA:  Chest pain  EXAM: MYOCARDIAL IMAGING WITH SPECT (REST AND PHARMACOLOGIC-STRESS - 2 DAY PROTOCOL)  GATED LEFT VENTRICULAR WALL MOTION STUDY  LEFT VENTRICULAR EJECTION FRACTION  TECHNIQUE: Standard myocardial SPECT imaging was performed after resting intravenous injection of 10 mCi Tc-61m sestamibi. Subsequently, on a second day, intravenous  infusion of Lexiscan was performed under the supervision of the Cardiology staff. At peak effect of the drug, 30 mCi Tc-79m sestamibi was injected intravenously and standard myocardial SPECT imaging was performed. Quantitative gated imaging was also performed to evaluate left ventricular wall motion, and estimate left ventricular ejection fraction.  COMPARISON:  Chest radiograph of 06/27/2014  FINDINGS: Myocardial perfusion study: Reduced inferior wall stress and rest activity compatible with a RCA distribution scar or less likely diaphragmatic attenuation. No inducible ischemia.  Ejection fraction calculation: End-diastolic volume is 72 mL. End systolic volume is 28 mL. Derived left ventricular ejection fraction of 61%.  Wall motion analysis: Left ventricular wall motion and wall thickening appear normal.  IMPRESSION: 1. Reduced activity in the inferior wall on stress and rest images, suspicious for scar. Diaphragmatic attenuation is less likely. No inducible ischemia. Ejection fraction normal.   Electronically Signed   By: Sherryl Barters M.D.   On: 06/28/2014 15:13   Medications: I have reviewed the patient's current medications. Scheduled Meds: . aspirin EC  81 mg Oral Daily  . atorvastatin  80 mg Oral q1800  . carvedilol  3.125 mg Oral BID WC  . cilostazol  100 mg Oral BID  . feeding supplement (ENSURE COMPLETE)  237 mL Oral TID BM  . heparin  5,000 Units Subcutaneous 3 times per day  . nicotine  14 mg Transdermal Daily   Continuous Infusions: . sodium chloride 100 mL/hr at 06/29/14 0857   PRN Meds:.acetaminophen, nitroGLYCERIN, ondansetron (ZOFRAN) IV Assessment/Plan: Principal Problem:   Stable angina Active Problems:   Acute renal failure superimposed on stage 3 chronic kidney disease   Headache   Essential hypertension, benign   Weight loss, unintentional   Current smoker   Alcohol use   Orthostatic hypotension   DOE (dyspnea on exertion)   Vitamin B12 deficiency    Atherosclerosis of leg with intermittent claudication, R  This is hospital day #3 for [name], a 73 year-old with a PMH of hypertension, alcohol abuse, chronic kidney disease, and macrocytic anemia who presented to the ED after waking up on Sunday with a headache. The day before he had been working in his yard until he started to feel dizzy, he then went inside to lay down, fell asleep, and didn't wake up until the next morning. This seemed odd to him as he typically doesn't sleep that late. On waking, he noticed a sharp headache that he rated a 5/10, nausea, blurry vision in his right eye, and chest pain that he described as a "bloated" feeling. All of these symptoms resolved after about the 1-2 hours. Of note, he also reported some dyspnea on exertion and chest pain, as well as frequent leg cramps that occur when walking short distance. This has been  going on for about 3 years. Important to note, Mr. Costigan does not have a PCP. After initial evaluation, we will work with him to establish follow up in the outpatient setting for better management of his chronic medical conditions.   Stable Angina and Hypertension: ECHO findings of LVEF of 55-60% as well as a Myoview notable for no reversible ischemia and a questionable scar inferiorly.  --patient has been started on 3.25 mg of Coreg.  -- Will also restart Norvasc as patient has tolerated medication well in the past.  -- Will consider adding a thiazide in outpatient setting once success of current regimen is assessed.   Claudication: ABI findings suggest an explanation for the patients bilateral leg cramping that occurs with exertion.  --Patient has been started on Cilostazol as it has been shown to help with claudication in some patients, will follow up in outpatient clinic. If unsuccessful, medication will be discontinued.   --Patient was advised on the importance of smoking cessation as well as exercise, he seemed agreeable to this.  -- nicotine  patches will be prescribed.   Wide-based gait, unsteadiness: Given patient has poor nutrition, has unexplained weight loss, as well as a history of alcoholism Folate and vitamin B12 levels were checked. Folate was normal, B12 was 253. According to uptodate levels in the 200-300 range could present with multiple neurological symptoms (like sensation, weakness, and unsteadiness). IM B12 injection was given.  --methymalonic acid results pending. Will reassess once results obtained.   Acute Renal Failure imposed on chronic renal failure: Patient has been on IV fluid hydration (100 cc NS). BUN and creatinine levels are down to 24 and 1.24 from 47 and 1.83, respectively.  --Will continue to monitor patient  Hyperkalemia: Potassium down to 5.0 from 5.7 yesterday. Abnormal findings yesterday possibly due to lab error. Will continue to monitor patient.   Headache: Resolved, no current complaints. Improved with caffeine. Patient believes this was due to not having his coffee. Will continue to monitor.   Poor dentition/poor nutrition, with significant and unintentional weight loss: Spoke with case management, information regarding [facility] program has been placed in F/U section.   Lack of primary care physician:  Will arrange for follow-up in outpatient setting in two weeks for better management of patient's chronic medical conditions. He states he should not have any trouble making it to his appointments.   This is a Careers information officer Note.  The care of the patient was discussed with Dr. Hulen Luster and the assessment and plan formulated with their assistance.  Please see their attached note for official documentation of the daily encounter.   LOS: 3 days   Gar Gibbon, Med Student 06/29/2014, 11:15 AM

## 2014-06-29 NOTE — Progress Notes (Signed)
  I have seen and examined the patient, and reviewed the daily progress note by Hulan Saas , MS 3 and discussed the care of the patient with them. Please see my progress note from 06/29/2014 for further details regarding assessment and plan.    Signed:  Julious Oka, MD 06/29/2014, 1:08 PM

## 2014-06-29 NOTE — Care Management Note (Signed)
    Page 1 of 1   06/29/2014     2:27:35 PM CARE MANAGEMENT NOTE 06/29/2014  Patient:  MEDFORD, LOCKLER   Account Number:  [id number]  Date Initiated:  06/29/2014  Documentation initiated by:  GRAVES-BIGELOW,Hazleigh Mccleave  Subjective/Objective Assessment:   Pt in with syncope. From home and d/c home self care.     Action/Plan:   CM did utilize the [facility] Program for pt for 3.00 each Rx. Pt does not have Rx drug coverage and medication total at [facility] aid came up to be 122.87. Pt unable to afford. CM did ask pt to go to the [place] outpatient.   Anticipated DC Date:  06/29/2014   Anticipated DC Plan:  [place]  CM consult  [facility]  Medication Assistance      Choice offered to / List presented to:             Status of service:  Completed, signed off Medicare Important Message given?  YES (If response is "NO", the following Medicare IM given date fields will be blank) Date Medicare IM given:  06/29/2014 Medicare IM given by:  GRAVES-BIGELOW,Kj Imbert Date Additional Medicare IM given:   Additional Medicare IM given by:    Discharge Disposition:  HOME/SELF CARE  Per UR Regulation:  Reviewed for med. necessity/level of care/duration of stay  If discussed at [place] of Stay Meetings, dates discussed:    Comments:

## 2014-06-29 NOTE — Discharge Summary (Signed)
Name: Derrick Hendrix MRN: [id number] DOB: 1941-02-18 73 y.o. PCP: Provider Default, MD  Date of Admission: 06/26/2014 11:41 AM Date of Discharge: 06/29/2014 Attending Physician: Karren Cobble, MD  Discharge Diagnosis: Principal Problem:   Stable angina Active Problems:   Acute renal failure superimposed on stage 3 chronic kidney disease   Headache   Essential hypertension, benign   Weight loss, unintentional   Current smoker   Alcohol use   Orthostatic hypotension   DOE (dyspnea on exertion)   Vitamin B12 deficiency   Atherosclerosis of leg with intermittent claudication, R  Discharge Medications:   Medication List         amLODipine 5 MG tablet  Commonly known as:  NORVASC  Take 1 tablet (5 mg total) by mouth daily.     aspirin 81 MG EC tablet  Take 1 tablet (81 mg total) by mouth daily.     atorvastatin 80 MG tablet  Commonly known as:  LIPITOR  Take 1 tablet (80 mg total) by mouth daily at 6 PM.     carvedilol 3.125 MG tablet  Commonly known as:  COREG  Take 1 tablet (3.125 mg total) by mouth 2 (two) times daily with a meal.     cilostazol 100 MG tablet  Commonly known as:  PLETAL  Take 1 tablet (100 mg total) by mouth 2 (two) times daily.     feeding supplement (ENSURE COMPLETE) Liqd  Take 237 mLs by mouth 3 (three) times daily between meals.     nicotine 14 mg/24hr patch  Commonly known as:  NICODERM CQ - dosed in mg/24 hours  Place 1 patch (14 mg total) onto the skin daily.     nitroGLYCERIN 0.4 MG SL tablet  Commonly known as:  NITROSTAT  Place 1 tablet (0.4 mg total) under the tongue every 5 (five) minutes x 3 doses as needed for chest pain.     vitamin B-12 1000 MCG tablet  Commonly known as:  CYANOCOBALAMIN  Take 1 tablet (1,000 mcg total) by mouth daily.        Disposition and follow-up:   Mr.Myson Mergel was discharged from [facility] in Stable condition.  At the hospital follow up visit please address:  1.  Please  ensure compliance w/ BP meds and check progress regarding smoking cessation.   2.  Labs / imaging needed at time of follow-up: Please check B12. Pt given B12 injection during admission and started on oral B12 x 14 days. If B12 lab is not greater than 300 will have to replete B12 with injections. Also get a UA to look for protein and recheck BMET to reassess CKD2. If UA positive for protein and GFR not improved from d/c GFR of 59 consider adding an ACEi. Would also like to eventually start a thiazide at some point for patient.   3.  Pending labs/ test needing follow-up: methylmalonic acid  Follow-up Appointments: Follow-up Information   Call [facility].   Contact information:   Please dial [phone] and this will give you community resources for dental clinics.       Follow up with [facility] On 07/13/2014. (2 week hospital f/u at 1:15)    Contact information:   [address] [place] [address]       Discharge Instructions: Discharge Instructions   Call MD for:  persistant dizziness or light-headedness    Complete by:  As directed  Consultations: Treatment Team:  Rounding Lbcardiology, MD  Procedures Performed:  Dg Chest 2 View  06/27/2014   CLINICAL DATA:  Forty top L weight loss, headache  EXAM: CHEST  2 VIEW  COMPARISON:  Chest x-ray of 06/26/2014  FINDINGS: The lungs are clear and slightly hyperaerated. Probable symmetrical nipple shadows are noted at both lung bases. Mediastinal and hilar contours are unremarkable. The heart is within normal limits in size. No bony abnormality is seen.  IMPRESSION: Slight hyper aeration. No active lung disease. Probable nipple shadows symmetrically at the lung bases.   Electronically Signed   By: Ivar Drape M.D.   On: 06/27/2014 09:24   Nm Myocar Multi W/spect W/wall Motion / Ef  06/28/2014   CLINICAL DATA:  Chest pain  EXAM: MYOCARDIAL IMAGING WITH SPECT (REST AND PHARMACOLOGIC-STRESS - 2 DAY  PROTOCOL)  GATED LEFT VENTRICULAR WALL MOTION STUDY  LEFT VENTRICULAR EJECTION FRACTION  TECHNIQUE: Standard myocardial SPECT imaging was performed after resting intravenous injection of 10 mCi Tc-64m sestamibi. Subsequently, on a second day, intravenous infusion of Lexiscan was performed under the supervision of the Cardiology staff. At peak effect of the drug, 30 mCi Tc-75m sestamibi was injected intravenously and standard myocardial SPECT imaging was performed. Quantitative gated imaging was also performed to evaluate left ventricular wall motion, and estimate left ventricular ejection fraction.  COMPARISON:  Chest radiograph of 06/27/2014  FINDINGS: Myocardial perfusion study: Reduced inferior wall stress and rest activity compatible with a RCA distribution scar or less likely diaphragmatic attenuation. No inducible ischemia.  Ejection fraction calculation: End-diastolic volume is 72 mL. End systolic volume is 28 mL. Derived left ventricular ejection fraction of 61%.  Wall motion analysis: Left ventricular wall motion and wall thickening appear normal.  IMPRESSION: 1. Reduced activity in the inferior wall on stress and rest images, suspicious for scar. Diaphragmatic attenuation is less likely. No inducible ischemia. Ejection fraction normal.   Electronically Signed   By: Sherryl Barters M.D.   On: 06/28/2014 15:13   Dg Chest Port 1 View  06/26/2014   CLINICAL DATA:  Chest pain  EXAM: PORTABLE CHEST - 1 VIEW  COMPARISON:  02/27/2012  FINDINGS: Cardiac shadow is within normal limits. Bilateral nipple shadows are noted. The lungs are clear bilaterally. No acute bony abnormality is seen. An old left rib fracture is again noted.  IMPRESSION: No acute abnormality noted.   Electronically Signed   By: Inez Catalina M.D.   On: 06/26/2014 12:35    2D Echo: EF 55-60%, no abnormal wall motion, grade 1 diastolic dysfunction  ABI:   RIGHT    LEFT     PRESSURE  WAVEFORM   PRESSURE  WAVEFORM   BRACHIAL  157   Triphasic  BRACHIAL  165  Triphasic   DP  114  Dampened monophasic  DP  157  Biphasic   PT  117  Dampened monophasic  PT  188  Triphasic     RIGHT  LEFT   ABI  0.71  1.14   ABIs indicate a moderate reduction in arterial flow on the right and normal flow on the left.  Duplex scan of the right lower extremity revealed mild to moderate heterogeneous plaque throughout. There appears to be a greater than 50% stenosis in the mid to distal region of the superficial femoral artery. Diminished Doppler waveforms and velocities are noted in the lower leg consistent with tibial vessel disease.   Lexiscan myoview: Myocardial perfusion study: Reduced inferior wall stress and rest activity compatible  with a RCA distribution scar or less likely diaphragmatic attenuation. No inducible ischemia.   Admission HPI: Mr. Amelung is a 73 y/o male w/ PMH of HTN, EtOH abuse, CKD, and macrocytic anemia who presents to the ED after waking up yesterday with a headache. Pt states that he was doing yardwork on Saturday around 6:00pm. He then felt overheated and dizzy and went into the house and laid down. He then woke up the next morning with a sharp HA that he rated as 5/10, nausea, blurry vision in his rt eye, and chest pain that he describes as a bloated feeling. All symptoms resolved within 1-2 hours. Pt had some confusion when he woke up, denied any incontinence. Pt also complains of DOE and chest pain when walking short distances stating he feels like he is about to pass out, which has been present for a few years. Pt lay flat at night and has to sleep w/ 3 pillows. Pt reports 45lbs weight loss w/in 3-4 months due to lack of appetite, lack of teeth, and early satiety. Pt drinks 3 cans of beer a few days a week and smokes 10-12 ciggs/day for the past 45 years.   Hospital Course by problem list: Principal Problem:   Stable angina Active Problems:   Acute renal failure superimposed on stage 3 chronic kidney disease    Headache   Essential hypertension, benign   Weight loss, unintentional   Current smoker   Alcohol use   Orthostatic hypotension   DOE (dyspnea on exertion)   Vitamin B12 deficiency   Atherosclerosis of leg with intermittent claudication, R   Dehydration w/orthostatic hypotension- likely 2/2 to inadequate hydration and presumed heat exhaustion, also w/ ?EtOH use as pt has hx of EtOH abuse. He reports recent 45lbs weight loss w/in 3 months due to lack of appetite and lack of teeth. Also pt reports early satiety. Pt had positive orthostatic vital signs on admission- 185/85, HR 71 lying down and 136/76, HR 86 standing up. BUN 43 and Cr 2.20. Pt given IVFs with resolution of orthostatic hypotension and a decrease in BUN and Cr to WNL.   Stable angina. Pt complained of chest pain on exertion that goes away with rest. Negative CXR. Pt also having orthopnea. Troponins x 3 negative. Patient started on aspirin 81mg  , coreg 3.125mg  BID, and lipitor 80 mg daily. Lipid panel WNL, HbA1c 5.0, TSH WNL (0.872). Cardiology consulted and proceeded w/ lexiscan and ECHO. ECHO revealed EF 55-60%, trivial regurgitation, normal wall motion and Lexiscan negative for inducible ischemia, reduced inf. Wall stress and rest activity compatible w/ RCA distribution scar or less likely diaphragmatic attenuation.   Rt leg PVD w/ presumed PVD on lt leg- Recent ABI results reveal rt 0.71 and lt 1.14. Duplex scan of rt leg revealed multiple plaques and >50% stenosis in the mid-distal region of the superficial femoral artery as well as diminished doppler waveforms and velocities in the lower leg consistent w/ tibial disease. Positive RF include smoking. Started on cilostazol 100mg  BID. Stressed importance of smoking cessation and prescribed nicotine patches on d/c.  Hyperkalemia- resolved. K+ 7/15 was 5.7 due to ?hemolysis of lab sample. Stat EKG unchanged and negative for widened QRS or peaked T waves. BMET rechecked and K+ found to be  5.0. On discharge K+ was 5.2.  HTN- initially held off on starting HTN meds due to dehydration and orthostatic hypotension. Pt has previously been on norvasc 5mg  w/ rapid improvement of BPs. Restarted norvasc 5mg  on day of  d/c and held initiation of thiazide for follow up at outpatient clinic.   Unintentional weight loss- pt self reports a 40-45lb weight loss w/i 3-4 month period. This could be due to lack of appetite, difficulty eating food w/ missing upper teeth, or a malignancy. Pt has long hx of smokingm, UA negative for  transitional cells and no gross hematuria. CXR reveals a widened mediastinum that is unchanged from CXR on 02/2012. Pt having chronic headaches however they do no wake him up from sleep and due to chronic nature not concerning for brain mets. HA's likely due to caffeine w/d. Will monitor weights at future appointments at [facility] as well as order appropriate screening tests, such as a colonoscopy which he is due for. Case management provided pt with a list of community dental clinics in the area   Lightheadedness- Possibly due to elevated BPs. On admission physical exam pt had decreased sensation on lt side of body and he reports having rt arm numbness. Also was unsteady during romberg test. Pt may have had a stroke in the past w/ elevated BPs as the inciting event with resolution. Held off on CT head during this admission since it would not change management.   Acute on chronic CKD- 2/2 pre-renal cause likely dehydration. GFR 02/2012 was 48, admission and on discharge GFR was 64.  Macrocytic anemia - pt has hx of EtOH abuse documented in chart. RBC folate was 328 and B12 found to be low side of normal at 253. Considering clinical findings of wide based gait, decreased sensation on lt versus rt side of body we ordered methylmalonic levels as it is possible that he may have a true B12 deficiency. Methylmalonic lab pending.   Tobacco abuse- pt given rx for nicotine patches 14mg  daily.  Stressed importance of tobacco cessation.     Discharge Vitals:   BP 147/56  Pulse 76  Temp(Src) 98.3 F (36.8 C) (Oral)  Resp 16  Ht 6\' 2"  (1.88 m)  Wt 150 lb (68.04 kg)  BMI 19.25 kg/m2  SpO2 100%  Discharge Labs:  Results for orders placed during the hospital encounter of 06/26/14 (from the past 24 hour(s))  BASIC METABOLIC PANEL     Status: Abnormal   Collection Time    06/28/14  1:46 PM      Result Value Ref Range   Sodium 138  137 - 147 mEq/L   Potassium 5.0  3.7 - 5.3 mEq/L   Chloride 104  96 - 112 mEq/L   CO2 19  19 - 32 mEq/L   Glucose, Bld 78  70 - 99 mg/dL   BUN 24 (*) 6 - 23 mg/dL   Creatinine, Ser 1.24  0.50 - 1.35 mg/dL   Calcium 8.6  8.4 - 10.5 mg/dL   GFR calc non Af Amer 55 (*) >90 mL/min   GFR calc Af Amer 64 (*) >90 mL/min   Anion gap 15  5 - 15    Signed: Julious Oka, MD 06/29/2014, 12:01 PM    Services Ordered on Discharge: none Equipment Ordered on Discharge: none

## 2014-06-30 LAB — METHYLMALONIC ACID, SERUM: METHYLMALONIC ACID, QUANTITATIVE: 286 nmol/L (ref 87–318)

## 2014-07-01 NOTE — Discharge Summary (Signed)
I agree with Dr. Caron Presume Discharge Summary with the following modification:  Discharge Diagnosis:   Principal Problem:   Atherosclerosis of leg with intermittent claudication, Right   Active Problems:   Severe protein malnutrition Headache  Essential hypertension  Tobacco abuse  Acute renal failure, resolved during admission  Orthostatic hypotension, resolved during admission  Methylmalonic acid: Normal

## 2014-07-13 ENCOUNTER — Encounter: Payer: Self-pay | Admitting: Internal Medicine

## 2014-07-13 ENCOUNTER — Ambulatory Visit (INDEPENDENT_AMBULATORY_CARE_PROVIDER_SITE_OTHER): Payer: Medicare Other | Admitting: Internal Medicine

## 2014-07-13 VITALS — BP 162/77 | HR 95 | Temp 97.5°F | Ht 74.0 in | Wt 147.2 lb

## 2014-07-13 DIAGNOSIS — N183 Chronic kidney disease, stage 3 unspecified: Secondary | ICD-10-CM

## 2014-07-13 DIAGNOSIS — E538 Deficiency of other specified B group vitamins: Secondary | ICD-10-CM

## 2014-07-13 DIAGNOSIS — I951 Orthostatic hypotension: Secondary | ICD-10-CM | POA: Diagnosis not present

## 2014-07-13 DIAGNOSIS — I10 Essential (primary) hypertension: Secondary | ICD-10-CM

## 2014-07-13 DIAGNOSIS — R634 Abnormal weight loss: Secondary | ICD-10-CM | POA: Diagnosis not present

## 2014-07-13 DIAGNOSIS — I209 Angina pectoris, unspecified: Secondary | ICD-10-CM

## 2014-07-13 DIAGNOSIS — F17200 Nicotine dependence, unspecified, uncomplicated: Secondary | ICD-10-CM | POA: Diagnosis not present

## 2014-07-13 DIAGNOSIS — Z0000 Encounter for general adult medical examination without abnormal findings: Secondary | ICD-10-CM | POA: Diagnosis not present

## 2014-07-13 DIAGNOSIS — N179 Acute kidney failure, unspecified: Secondary | ICD-10-CM

## 2014-07-13 DIAGNOSIS — I208 Other forms of angina pectoris: Secondary | ICD-10-CM

## 2014-07-13 LAB — BASIC METABOLIC PANEL WITH GFR
BUN: 32 mg/dL — AB (ref 6–23)
CALCIUM: 8.9 mg/dL (ref 8.4–10.5)
CO2: 21 mEq/L (ref 19–32)
Chloride: 109 mEq/L (ref 96–112)
Creat: 1.87 mg/dL — ABNORMAL HIGH (ref 0.50–1.35)
GFR, EST AFRICAN AMERICAN: 40 mL/min — AB
GFR, Est Non African American: 34 mL/min — ABNORMAL LOW
Glucose, Bld: 118 mg/dL — ABNORMAL HIGH (ref 70–99)
POTASSIUM: 4.8 meq/L (ref 3.5–5.3)
Sodium: 140 mEq/L (ref 135–145)

## 2014-07-13 LAB — CBC WITH DIFFERENTIAL/PLATELET
BASOS PCT: 0 % (ref 0–1)
Basophils Absolute: 0 10*3/uL (ref 0.0–0.1)
EOS ABS: 0.2 10*3/uL (ref 0.0–0.7)
Eosinophils Relative: 3 % (ref 0–5)
HCT: 33.1 % — ABNORMAL LOW (ref 39.0–52.0)
Hemoglobin: 10.9 g/dL — ABNORMAL LOW (ref 13.0–17.0)
Lymphocytes Relative: 17 % (ref 12–46)
Lymphs Abs: 1.3 10*3/uL (ref 0.7–4.0)
MCH: 33.3 pg (ref 26.0–34.0)
MCHC: 32.9 g/dL (ref 30.0–36.0)
MCV: 101.2 fL — ABNORMAL HIGH (ref 78.0–100.0)
Monocytes Absolute: 0.5 10*3/uL (ref 0.1–1.0)
Monocytes Relative: 7 % (ref 3–12)
NEUTROS ABS: 5.4 10*3/uL (ref 1.7–7.7)
NEUTROS PCT: 73 % (ref 43–77)
PLATELETS: 318 10*3/uL (ref 150–400)
RBC: 3.27 MIL/uL — AB (ref 4.22–5.81)
RDW: 15.8 % — ABNORMAL HIGH (ref 11.5–15.5)
WBC: 7.4 10*3/uL (ref 4.0–10.5)

## 2014-07-13 MED ORDER — ASPIRIN 81 MG PO TBEC: 81.0000 mg | DELAYED_RELEASE_TABLET | Freq: Every day | ORAL | Status: DC

## 2014-07-13 MED ORDER — CARVEDILOL 6.25 MG PO TABS: 6.2500 mg | ORAL_TABLET | Freq: Two times a day (BID) | ORAL | Status: DC

## 2014-07-13 MED ORDER — VARENICLINE TARTRATE 0.5 MG PO TABS: 0.5000 mg | ORAL_TABLET | Freq: Two times a day (BID) | ORAL | Status: DC

## 2014-07-13 NOTE — Progress Notes (Signed)
   Subjective:    Patient ID: Derrick Hendrix, male    DOB: [birth year], 73 y.o.   MRN: [id number]  HPI Comments: Derrick Hendrix is a 73 year old man with a history of HTN, EtOH abuse, CKD, and macrocytic anemia presenting for hospital follow up.  He was recently hospitalized from 06/26/14 through 06/29/14 for dehydration with orthostatic hypotension.  He was working in his yard and felt like he was going to pass out.  He went to sleep that night around 6 and woke up the next morning with chest pain, headaches, and blurry vision.  He was hospitalized and rehydrated, leading to improvement in his blood pressure, headache, and blurry vision.  Since being discharged, he reports taking most of his medications. He has been taking his amlodipine 5 mg daily and carvedilol 3.25 mg BID.  He is not taking aspirin or using nicotine patches because they were too expensive for him to pick up.  He reports continued chest tightness with exertion that resolved at rest.  He reports cutting back on his smoking to 1/2 pack per day from 2 ppd.  He has been trying to cut back on his alcohol consumption as well.  He reports a 40-45 pound weight loss over last 3-4 months with associated lack of appetite and trouble eating food.     Review of Systems  Constitutional: Negative for fever, chills and fatigue.  HENT: Negative for congestion and rhinorrhea.   Eyes: Negative for visual disturbance.  Respiratory: Negative for cough, shortness of breath and wheezing.   Gastrointestinal: Negative for nausea, vomiting, diarrhea and constipation.  Genitourinary: Positive for difficulty urinating.  Musculoskeletal: Negative for arthralgias and myalgias.  Skin: Negative for rash.  Neurological: Positive for headaches. Negative for weakness and numbness.  Psychiatric/Behavioral: Negative for dysphoric mood and agitation.       Objective:   Physical Exam  Constitutional: He is oriented to person, place, and time. He appears  well-developed. No distress.  Dirty and disheveled.  HENT:  Head: Normocephalic and atraumatic.  Several dental carries.  Eyes: Conjunctivae and EOM are normal. Pupils are equal, round, and reactive to light.  Neck: Normal range of motion.  Cardiovascular: Normal rate, regular rhythm and normal heart sounds.   Pulmonary/Chest: Effort normal and breath sounds normal. No respiratory distress. He has no wheezes. He has no rales.  Abdominal: Soft. Bowel sounds are normal. He exhibits no distension. There is no tenderness.  Musculoskeletal: Normal range of motion. He exhibits no edema and no tenderness.  Neurological: He is alert and oriented to person, place, and time. No cranial nerve deficit.  Skin: Skin is warm and dry. No rash noted. He is not diaphoretic.  Psychiatric: He has a normal mood and affect.          Assessment & Plan:  Please see problem-based assessment and plan.

## 2014-07-13 NOTE — Assessment & Plan Note (Addendum)
Patient has B12 deficiency with macrocytic anemia on supplementation.  Can not draw another B12 today as insurance only covers 4 per year. -Check CBC and trend Hgb.

## 2014-07-13 NOTE — Assessment & Plan Note (Signed)
BP Readings from Last 3 Encounters:  07/13/14 162/77  06/29/14 147/56  06/29/14 147/56    Lab Results  Component Value Date   NA 141 06/29/2014   K 5.2 06/29/2014   CREATININE 1.33 06/29/2014    Assessment: Blood pressure control: moderately elevated Progress toward BP goal:  deteriorated  Plan: Medications:  Increase carvedilol to 6.2 mg BID

## 2014-07-13 NOTE — Assessment & Plan Note (Signed)
Resolved since hospitalization with improvement of headache and blurry vision. -Encouraged adequate hydration.

## 2014-07-13 NOTE — Patient Instructions (Signed)
Thank you for coming to clinic today [name].  General instructions: -We will schedule you for a colonoscopy and dentist appointment and contact you with the time. -To help with stopping smoking, start taking Chantix 1 week before you plan to quit.  Take 1 tablet per day for 3 days, then increase to 1 tablet twice a day.  Continue for 12 weeks. -Increase your Coreg to 6.2 mg twice a day with meals. -Pick up your aspirin from the pharmacy and take 81 mg once per day. -Please make a follow up appointment to return to clinic in 1 month.  Thank you for bringing your medicines today. This helps [place] keep you safe from mistakes.

## 2014-07-13 NOTE — Assessment & Plan Note (Signed)
  Assessment: Progress toward smoking cessation:  smoking less Barriers to progress toward smoking cessation:  cost of medications Comments: Could not pick up Nicoderm due to cost.  Plan: Instruction/counseling given:  I counseled patient on the dangers of tobacco use, advised patient to stop smoking, and reviewed strategies to maximize success. Medications to assist with smoking cessation:  Varenicline (Chantix), hopefully less expensive with patient's insurance.

## 2014-07-13 NOTE — Assessment & Plan Note (Signed)
Patient continues to have exersional chest pain, that is unchanged since hospitalization. He had an extensive workup in the hospital including echo and stress test that showed no inducible ischemia.  He is at high risk for heart disease given his peripheral vascular disease, smoking, age, and angina.  He would definitely benefit from anti-platelet, lipid lowering therapy, and control of his hypertension. -Sent prescription for aspirin to pharmacy to hopefully decrease cost. -Increased Coreg to 6.2 mg BID, given room to decrease heart rate. -Continue amlodipine 5 mg daily. -Continue atorvastatin 80 mg daily. -Continue SL nitro PRN for chest pain.

## 2014-07-13 NOTE — Assessment & Plan Note (Addendum)
Patient has continued to lose three pounds since hospital discharge.  This weight loss is associated with a decreased appetite and food intake.  He reports pain with chewing due to poor dentition.  No other signs of malignancy, but he has never had a colonoscopy. -Referred for colonoscopy. -Referred to dentist.

## 2014-07-13 NOTE — Assessment & Plan Note (Signed)
Patient's renal failure was trending towards his baseline upon discharge from the hospital.  He had proteinuria on admission urinalysis, and he may benefit from ACE/ARB therapy if his blood pressure remains elevated and his kidney function has stabilized. -Repeat urinalysis and BMP.

## 2014-07-14 ENCOUNTER — Telehealth: Payer: Self-pay | Admitting: *Deleted

## 2014-07-14 LAB — URINALYSIS, COMPLETE
Bacteria, UA: NONE SEEN
Bilirubin Urine: NEGATIVE
Casts: NONE SEEN
Crystals: NONE SEEN
GLUCOSE, UA: NEGATIVE mg/dL
HGB URINE DIPSTICK: NEGATIVE
Ketones, ur: NEGATIVE mg/dL
Nitrite: NEGATIVE
PROTEIN: 30 mg/dL — AB
Specific Gravity, Urine: 1.017 (ref 1.005–1.030)
Squamous Epithelial / LPF: NONE SEEN
Urobilinogen, UA: 0.2 mg/dL (ref 0.0–1.0)
pH: 5 (ref 5.0–8.0)

## 2014-07-14 NOTE — Telephone Encounter (Signed)
CALLED PATIENT LVM FOR PATIENT TO CALL OPC. UNABLE TO MAKE DENTAL REFERRAL. HE ONLY HAVE MEDICARE, MEDICARE DO NOT PAY FOR ANY TYPE OF DENTAL.   LELA STURDIVANT NTII [phone]

## 2014-07-14 NOTE — Progress Notes (Signed)
INTERNAL MEDICINE TEACHING ATTENDING ADDENDUM - Aldine Contes, MD: I personally saw and evaluated Derrick Hendrix in this clinic visit in conjunction with the resident, Dr. Trudee Kuster. I have discussed patient's plan of care with medical resident during this visit. I have confirmed the physical exam findings and have read and agree with the clinic note including the plan with the following addition: On exam : Cardio- RRR, normal heart sounds, Lungs - CTA b/l - Will increase coreg to 6.25 mg bid given increased BP and history of angina - Started on asa today - Patient counseled on smoking cessation

## 2014-07-26 ENCOUNTER — Encounter: Payer: Self-pay | Admitting: *Deleted

## 2014-07-28 ENCOUNTER — Other Ambulatory Visit (INDEPENDENT_AMBULATORY_CARE_PROVIDER_SITE_OTHER): Payer: Medicare Other

## 2014-07-28 ENCOUNTER — Other Ambulatory Visit: Payer: Self-pay | Admitting: Internal Medicine

## 2014-07-28 DIAGNOSIS — I10 Essential (primary) hypertension: Secondary | ICD-10-CM | POA: Diagnosis not present

## 2014-07-28 DIAGNOSIS — E538 Deficiency of other specified B group vitamins: Secondary | ICD-10-CM | POA: Diagnosis not present

## 2014-07-28 LAB — CBC WITH DIFFERENTIAL/PLATELET
Basophils Absolute: 0 10*3/uL (ref 0.0–0.1)
Basophils Relative: 0 % (ref 0–1)
Eosinophils Absolute: 0.3 10*3/uL (ref 0.0–0.7)
Eosinophils Relative: 5 % (ref 0–5)
HCT: 30.1 % — ABNORMAL LOW (ref 39.0–52.0)
Hemoglobin: 10.2 g/dL — ABNORMAL LOW (ref 13.0–17.0)
Lymphocytes Relative: 22 % (ref 12–46)
Lymphs Abs: 1.3 10*3/uL (ref 0.7–4.0)
MCH: 33.6 pg (ref 26.0–34.0)
MCHC: 33.9 g/dL (ref 30.0–36.0)
MCV: 99 fL (ref 78.0–100.0)
Monocytes Absolute: 0.6 10*3/uL (ref 0.1–1.0)
Monocytes Relative: 10 % (ref 3–12)
Neutro Abs: 3.7 10*3/uL (ref 1.7–7.7)
Neutrophils Relative %: 63 % (ref 43–77)
Platelets: 248 10*3/uL (ref 150–400)
RBC: 3.04 MIL/uL — ABNORMAL LOW (ref 4.22–5.81)
RDW: 14.5 % (ref 11.5–15.5)
WBC: 5.9 10*3/uL (ref 4.0–10.5)

## 2014-07-29 LAB — BASIC METABOLIC PANEL WITH GFR
BUN: 28 mg/dL — ABNORMAL HIGH (ref 6–23)
CALCIUM: 8.3 mg/dL — AB (ref 8.4–10.5)
CO2: 21 mEq/L (ref 19–32)
Chloride: 111 mEq/L (ref 96–112)
Creat: 1.5 mg/dL — ABNORMAL HIGH (ref 0.50–1.35)
GFR, EST AFRICAN AMERICAN: 52 mL/min — AB
GFR, EST NON AFRICAN AMERICAN: 45 mL/min — AB
GLUCOSE: 86 mg/dL (ref 70–99)
POTASSIUM: 6 meq/L — AB (ref 3.5–5.3)
SODIUM: 142 meq/L (ref 135–145)

## 2014-07-31 ENCOUNTER — Telehealth: Payer: Self-pay | Admitting: Internal Medicine

## 2014-07-31 DIAGNOSIS — I10 Essential (primary) hypertension: Secondary | ICD-10-CM

## 2014-07-31 NOTE — Telephone Encounter (Signed)
Called Mr. Edell to tell him about elevated potassium on lab visit from 07/28/14.  Neighbor answered mobile phone number and said that he does not have a phone, but she can give him a message.  I told her to tell Mr. Kostrzewski that he had an abnormal lab value and that he needs to come to [facility] as soon as possible to be seen to have it addressed. I gave her my pager number and the [facility] clinic number for him to contact as soon as possible.

## 2014-07-31 NOTE — Telephone Encounter (Signed)
I called the patient's listed mobile phone again, and he answered.  I told him that his potassium level was elevated, and he needs to come to clinic tomorrow to get his labs rechecked and to be evaluated by a physician.  He said he would get a ride and make it to clinic tomorrow.  He denies any symptoms currently, including chest pain, shortness of breath, palpitations, or weakness.

## 2014-08-01 ENCOUNTER — Encounter: Payer: Self-pay | Admitting: Internal Medicine

## 2014-08-01 ENCOUNTER — Ambulatory Visit (INDEPENDENT_AMBULATORY_CARE_PROVIDER_SITE_OTHER): Payer: Medicare Other | Admitting: Internal Medicine

## 2014-08-01 ENCOUNTER — Ambulatory Visit (HOSPITAL_COMMUNITY)
Admission: RE | Admit: 2014-08-01 | Discharge: 2014-08-01 | Disposition: A | Discharge status: Home / Self Care | Payer: Medicare Other | Source: Ambulatory Visit | Attending: Internal Medicine | Admitting: Internal Medicine

## 2014-08-01 VITALS — BP 156/83 | HR 74 | Temp 98.2°F | Wt 154.0 lb

## 2014-08-01 DIAGNOSIS — N401 Enlarged prostate with lower urinary tract symptoms: Secondary | ICD-10-CM | POA: Insufficient documentation

## 2014-08-01 DIAGNOSIS — N183 Chronic kidney disease, stage 3 unspecified: Secondary | ICD-10-CM

## 2014-08-01 DIAGNOSIS — F17200 Nicotine dependence, unspecified, uncomplicated: Secondary | ICD-10-CM | POA: Diagnosis not present

## 2014-08-01 DIAGNOSIS — E875 Hyperkalemia: Secondary | ICD-10-CM

## 2014-08-01 DIAGNOSIS — I129 Hypertensive chronic kidney disease with stage 1 through stage 4 chronic kidney disease, or unspecified chronic kidney disease: Secondary | ICD-10-CM | POA: Insufficient documentation

## 2014-08-01 DIAGNOSIS — Z0000 Encounter for general adult medical examination without abnormal findings: Secondary | ICD-10-CM | POA: Diagnosis not present

## 2014-08-01 DIAGNOSIS — I209 Angina pectoris, unspecified: Secondary | ICD-10-CM

## 2014-08-01 DIAGNOSIS — N179 Acute kidney failure, unspecified: Secondary | ICD-10-CM | POA: Diagnosis not present

## 2014-08-01 DIAGNOSIS — R3911 Hesitancy of micturition: Secondary | ICD-10-CM | POA: Diagnosis not present

## 2014-08-01 DIAGNOSIS — R339 Retention of urine, unspecified: Secondary | ICD-10-CM | POA: Insufficient documentation

## 2014-08-01 DIAGNOSIS — R634 Abnormal weight loss: Secondary | ICD-10-CM | POA: Diagnosis not present

## 2014-08-01 DIAGNOSIS — I10 Essential (primary) hypertension: Secondary | ICD-10-CM

## 2014-08-01 DIAGNOSIS — I951 Orthostatic hypotension: Secondary | ICD-10-CM | POA: Diagnosis not present

## 2014-08-01 DIAGNOSIS — E538 Deficiency of other specified B group vitamins: Secondary | ICD-10-CM | POA: Diagnosis not present

## 2014-08-01 DIAGNOSIS — N138 Other obstructive and reflux uropathy: Secondary | ICD-10-CM | POA: Insufficient documentation

## 2014-08-01 LAB — BASIC METABOLIC PANEL WITH GFR
BUN: 29 mg/dL — AB (ref 6–23)
CO2: 21 mEq/L (ref 19–32)
CREATININE: 1.39 mg/dL — AB (ref 0.50–1.35)
Calcium: 9.5 mg/dL (ref 8.4–10.5)
Chloride: 112 mEq/L (ref 96–112)
GFR, EST AFRICAN AMERICAN: 57 mL/min — AB
GFR, EST NON AFRICAN AMERICAN: 49 mL/min — AB
Glucose, Bld: 88 mg/dL (ref 70–99)
Potassium: 5.5 mEq/L — ABNORMAL HIGH (ref 3.5–5.3)
Sodium: 144 mEq/L (ref 135–145)

## 2014-08-01 MED ORDER — SODIUM POLYSTYRENE SULFONATE PO POWD: Freq: Once | ORAL | Status: DC

## 2014-08-01 MED ORDER — TAMSULOSIN HCL 0.4 MG PO CAPS: 0.4000 mg | ORAL_CAPSULE | Freq: Every day | ORAL | Status: DC

## 2014-08-01 MED ORDER — SODIUM POLYSTYRENE SULFONATE 15 GM/60ML PO SUSP: 15.0000 g | Freq: Once | ORAL | Status: DC

## 2014-08-01 NOTE — Assessment & Plan Note (Signed)
Cr elevated 1.87--> 1.5 --> 1.39 today in setting of hyperkalemia 6.0 --> 5.5. Pt has BPH symptoms and post-void residual U/S shows elevated volume of 182 ml. (See note for hyperkalemia for 8/18.) Elevated Cr and hyperkalemia could be 2/2 urinary obstruction, BPH (more likely) vs. Renal stones. - Started on Flomax 0.4 mg daily - Consider adding ACE inhibitor/ARB if BP remains elevated

## 2014-08-01 NOTE — Assessment & Plan Note (Addendum)
Pt had K 6.0 on 8/14, now 5.5. EKG showed peaked T waves in leads V2 and more pronounced peaked T waves in leads V4-V6 compared to previous EKG. Pt currently asymptomatic. Concern that hyperkalemia 2/2 urinary obstruction with elevated Cr and symptoms of BPH (difficulty starting stream and not emptying bladder completely). Post-void residual U/S done in office which showed post-void volume of 182 ml, which is above normal limits. Concern for BPH (more likely given symptoms and no flank pain) vs. Renal stones causing obstruction.  - Started on Flomax 0.4 mg daily - Kayexalate 15 g once - f/u in 1 week to see if BPH symptoms improving - Repeat BMP in 1 week - Referral to urology made - Consider renal ultrasound

## 2014-08-01 NOTE — Patient Instructions (Addendum)
It was a pleasure taking care of you today, Derrick Hendrix.  Here is what we discussed today:  1. High Potassium Level - Your potassium was 6.0, it is now 5.5 - Take Kayexalate 30 g - Post-void residual ultrasound today - Start Flomax 0.4 mg daily - Appt in 1 week   2. Blood pressure - Continue Amlodipine 5 mg daily - Take Carvedilol (Coreg) 6.25 mg twice a day   3. Smoking - Pick up Chantix at pharmacy - Continue cutting down smoking. Try to get to 1/4 pack per day instead of 1/2 pack.  General Instructions:   Please bring your medicines with you each time you come to clinic.  Medicines may include prescription medications, over-the-counter medications, herbal remedies, eye drops, vitamins, or other pills.   Progress Toward Treatment Goals:  Treatment Goal 07/13/2014  Blood pressure deteriorated  Stop smoking smoking less    Self Care Goals & Plans:  No flowsheet data found.  No flowsheet data found.   Care Management & Community Referrals:  No flowsheet data found.

## 2014-08-01 NOTE — Assessment & Plan Note (Signed)
BP Readings from Last 3 Encounters:  08/01/14 156/83  07/13/14 162/77  06/29/14 147/56    Lab Results  Component Value Date   NA 144 08/01/2014   K 5.5* 08/01/2014   CREATININE 1.39* 08/01/2014    Assessment: Blood pressure control: mildly elevated Progress toward BP goal:  unchanged Comments:   Plan: Medications:  continue current medications Educational resources provided:   Self management tools provided:   Other plans: BP not at goal. Pt not taking new dose of Coreg yet. Will pick up medications today. Continue current medications.

## 2014-08-01 NOTE — Assessment & Plan Note (Addendum)
  Assessment: Progress toward smoking cessation:  smoking less Barriers to progress toward smoking cessation:  lack of motivation to quit Comments: Pt not ready to quit yet, but smoking 1/2 ppd instead of 1.5-2 ppd.  Plan: Instruction/counseling given:  I counseled patient on the dangers of tobacco use, advised patient to stop smoking, and reviewed strategies to maximize success. Educational resources provided:    Self management tools provided:    Medications to assist with smoking cessation:  Varenicline (Chantix) Patient agreed to the following self-care plans for smoking cessation: cut down the number of cigarettes smoked  Other plans: Pt states he would like to start using Chantix but had difficulty getting medication because he did not have his Medicare card on him. He states he now has his card and will pick up the prescription. Pt encouraged to cut down to 1/4 ppd.

## 2014-08-01 NOTE — Progress Notes (Signed)
   Subjective:    Patient ID: Derrick Hendrix, male    DOB: 1941-06-15, 73 y.o.   MRN: [id number]  HPI Derrick Hendrix is 73yo man w/ PMHx of HTN, stable angina, CKD stage 3, and alcohol use who presents today for the following:  1. Hyperkalemia: Pt had lab work done on 8/14 and found to have potassium of 6.0. He was contacted by Dr. Trudee Kuster to come to the clinic as soon as possible to have his labs rechecked. Pt denies muscle cramps, weakness, nausea, CP, palpitations, SOB, and diarrhea at this time. A stat BMP was done and his potassium is now 5.5. An EKG was performed that showed peaked T waves in leads V2 and more pronounced peaked T waves in V4-V6 compared to previous EKG on 06/30/14. Also noted on his BMP from 8/14 was elevated Cr of 1.50, previously 1.87 2 weeks prior. Upon further questioning patient admits to difficulty starting a stream and the feeling of not completely emptying his bladder. He denies dysuria, hematuria, abdominal pain, and back/flank pain.  2. HTN: BP today is 156/83. BP normally ranges in 140-160s/70-80s. Pt takes Amlodipine 5 mg daily and his Coreg was recently increased to 6.25 mg BID. However, patient states he has not picked up his new prescription so he has been taking Coreg 3.125 mg BID. He denies any headaches, changes in vision, CP, SOB.   3. Smoking: Pt currently smokes 1/2 ppd, previously smoked 1.5-2 ppd. He was prescribed Chantix at his last appointment but has not been able to get this prescription due to issues with his Medicare card.   Review of Systems General: Denies fever, chills, night sweats, changes in weight, changes in appetite HEENT: Denies ear pain, rhinorrhea, sore throat CV: Denies orthopnea Pulm: Denies cough, wheezing GI: Denies vomiting, constipation, melena, hematochezia GU: See HPI Msk: Denies joint pains Neuro: Denies numbness, tingling Skin: Denies rashes, bruising    Objective:   Physical Exam General: appears stated age, sitting up in  chair, NAD HEENT: [place]/AT, EOMI, PERRL, sclera anicteric, pharynx non-erythematous, mucus membranes moist Neck: supple, no JVD, no lymphadenopathy CV: RRR, normal S1/S2, no m/g/r Pulm: CTA bilaterally, breaths non-labored, no wheezing Abd: BS+, soft, non-distended, non-tender, no CVA tenderness Ext: warm, no edema, moves all Neuro: alert and oriented x 3, CNs II-XII intact, strength 5/5 in upper and lower extremities bilaterally       Assessment & Plan:

## 2014-08-02 ENCOUNTER — Encounter: Payer: Self-pay | Admitting: Gastroenterology

## 2014-08-04 NOTE — Progress Notes (Signed)
Internal Medicine Clinic Attending Date of visit: 08/01/2014  I saw and evaluated the patient.  I personally confirmed the key portions of the history and exam documented by Dr. Arcelia Jew and I reviewed pertinent patient test results.  The assessment, diagnosis, and plan were formulated together and I agree with the documentation in the resident's note, with the following additional comments.  The cause of his hyperkalemia and acute renal insufficiency is not clear; both his creatinine and potassium were improved today.  He does give a history of urinary hesitancy, and his postvoid residual was elevated, raising the question of whether he may have some component of obstructive uropathy.  Agree with empiric treatment and close followup; urology referral would be beneficial.

## 2014-08-09 ENCOUNTER — Ambulatory Visit (INDEPENDENT_AMBULATORY_CARE_PROVIDER_SITE_OTHER): Payer: Medicare Other | Admitting: Internal Medicine

## 2014-08-09 ENCOUNTER — Encounter: Payer: Self-pay | Admitting: Internal Medicine

## 2014-08-09 ENCOUNTER — Ambulatory Visit (HOSPITAL_COMMUNITY)
Admission: RE | Admit: 2014-08-09 | Discharge: 2014-08-09 | Disposition: A | Discharge status: Home / Self Care | Payer: Medicare Other | Source: Ambulatory Visit | Attending: Internal Medicine | Admitting: Internal Medicine

## 2014-08-09 VITALS — BP 162/90 | HR 71 | Temp 97.0°F | Ht 74.0 in | Wt 157.0 lb

## 2014-08-09 DIAGNOSIS — E875 Hyperkalemia: Secondary | ICD-10-CM

## 2014-08-09 DIAGNOSIS — I10 Essential (primary) hypertension: Secondary | ICD-10-CM | POA: Diagnosis not present

## 2014-08-09 LAB — BASIC METABOLIC PANEL
BUN: 27 mg/dL — ABNORMAL HIGH (ref 6–23)
CALCIUM: 9.1 mg/dL (ref 8.4–10.5)
CO2: 22 meq/L (ref 19–32)
Chloride: 111 mEq/L (ref 96–112)
Creat: 1.23 mg/dL (ref 0.50–1.35)
Glucose, Bld: 76 mg/dL (ref 70–99)
Potassium: 6 mEq/L — ABNORMAL HIGH (ref 3.5–5.3)
Sodium: 144 mEq/L (ref 135–145)

## 2014-08-09 LAB — URINALYSIS, ROUTINE W REFLEX MICROSCOPIC
Bilirubin Urine: NEGATIVE
Glucose, UA: NEGATIVE mg/dL
Hgb urine dipstick: NEGATIVE
Ketones, ur: NEGATIVE mg/dL
Leukocytes, UA: NEGATIVE
Nitrite: NEGATIVE
Protein, ur: 30 mg/dL — AB
SPECIFIC GRAVITY, URINE: 1.015 (ref 1.005–1.030)
Urobilinogen, UA: 0.2 mg/dL (ref 0.0–1.0)
pH: 5 (ref 5.0–8.0)

## 2014-08-09 LAB — POTASSIUM, URINE, RANDOM: POTASSIUM UR: 31 meq/L

## 2014-08-09 LAB — OSMOLALITY, URINE: Osmolality, Ur: 584 mOsm/kg (ref 390–1090)

## 2014-08-09 LAB — URINALYSIS, MICROSCOPIC ONLY

## 2014-08-09 LAB — OSMOLALITY: Osmolality: 302 mOsm/kg — ABNORMAL HIGH (ref 275–300)

## 2014-08-09 MED ORDER — AMLODIPINE BESYLATE 5 MG PO TABS: 10.0000 mg | ORAL_TABLET | Freq: Every day | ORAL | Status: DC

## 2014-08-09 MED ORDER — SODIUM POLYSTYRENE SULFONATE 15 GM/60ML PO SUSP: 30.0000 g | Freq: Once | ORAL | Status: DC

## 2014-08-09 NOTE — Patient Instructions (Addendum)
It was a pleasure taking care of you, Mr. Resendis.  1. High Potassium Level - Likely due to enlarged prostate causing your urinary symptoms - Repeat blood work today - EKG today - Please take Kayexalate 30 g  - Please take Flomax 0.4 mg daily  2. High Blood Pressure - Continue Amlodipine 5 mg once a day - Start taking Carvedilol 6.25 mg twice a day. If using your old prescription bottle take 2 pills twice a day. When you pick up your new prescription only take 1 pill twice a day.  3. Smoking - Pick up Chantix prescription - Try to cut down to 1/4 pack per day  General Instructions:   Thank you for bringing your medicines today. This helps [place] keep you safe from mistakes.   Progress Toward Treatment Goals:  Treatment Goal 08/01/2014  Blood pressure unchanged  Stop smoking smoking less    Self Care Goals & Plans:  Self Care Goal 08/09/2014  Manage my medications take my medicines as prescribed; bring my medications to every visit; refill my medications on time  Eat healthy foods drink diet soda or water instead of juice or soda; eat more vegetables; eat foods that are low in salt  Stop smoking -    No flowsheet data found.   Care Management & Community Referrals:  No flowsheet data found.

## 2014-08-10 NOTE — Progress Notes (Signed)
   Subjective:    Patient ID: Derrick Hendrix, male    DOB: [birth year], 73 y.o.   MRN: [id number]  HPI Derrick Hendrix is a 73yo man w/ PMHx of HTN, stable angina, CKD Stage 3, and tobacco abuse who presents today for follow-up of his hyperkalemia. Patient presented to the IM clinic on 8/18 after being called by Dr. Trudee Kuster in regards to his elevated potassium of 6.0 that was done on 8/14. A repeat potassium was performed in clinic that was found to be 5.5. An EKG was performed that showed peaked T waves in leads V2 and more pronounced peaked T waves in V4-V6 compared to previous EKG on 06/30/14. Pt's Cr noted to be elevated at 1.50, but down from 1.87 two weeks earlier. Patient had noted urinary symptoms including difficulty starting a stream, dribbling, and the feeling of not completely emptying his bladder. A post void residual volume was done that showed a volume of 182 ml. There was concern that his symptoms were due to BPH that was causing urinary obstruction. He was instructed to start taking Flomax 0.4 mg daily and to take 1 dose of Kayexalate 15 g, and to return in 1 week.  Today, patient reports he is still having difficulty starting a stream and dribbling. He reports he did not start Flomax nor take Kayexalate because he did not have his Medicare card until today and was not able to pick up the prescriptions. Patient reports he has been feeling anxious over the last week. He denies dysuria, hematuria, abdominal pain, nausea, vomiting, increased tiredness, muscle cramps, chest pain, palpitations, and SOB.   A stat bmet was performed which showed a potassium level of 6.0. Repeat EKG was done which showed peaked T waves in leads II, III, V3-V6.    Review of Systems General: Denies fever, chills, night sweats, changes in weight, changes in appetite HEENT: Denies headaches, ear pain, changes in vision, rhinorrhea, sore throat CV: Denies orthopnea Pulm: Denies cough, wheezing GI: Denies diarrhea,  constipation, melena, hematochezia GU: See HPI Msk: Denies joint pains Neuro: Denies weakness, numbness, tingling Skin: Denies rashes, bruising       Physical Exam General: appears stated age, sitting up in chair, anxious HEENT: [place]/AT, EOMI, PERRL, sclera anicteric, pharynx non-erythematous, mucus membranes moist Neck: supple, no JVD, no lymphadenopathy CV: RRR, normal S1/S2, no m/g/r Pulm: CTA bilaterally, breaths non-labored, no wheezing Abd: BS+, soft, non-distended, non-tender Ext: warm, no edema, moves all Neuro: alert and oriented x 3, CNs II-XII intact, strength 5/5 in upper and lower extremities bilaterally       Assessment & Plan:

## 2014-08-10 NOTE — Assessment & Plan Note (Signed)
Pt with persistent hyperkalemia. K trend: 6.0 --> 5.5 --> 6.0 today. Pt did not take Flomax nor Kayexalate after last visit 1 week ago. EKG shows peaked T waves. Pt asymptomatic.   Pt advised to be admitted to the hospital because of the risk of an arrhythmia with persistent hyperkalemia and EKG changes. Pt refused to be admitted. He agreed to pick up prescription for Flomax and Kayexalate and follow up on Friday 8/28 for repeat labs, EKG. A renal ultrasound was ordered but patient did not get this imaging. Will have patient get renal ultrasound at appointment on 8/28.   Urine osmolality, urine potassium, and plasma osmolality were obtained and the transtubular potassium gradient calculated to be 3. A higher gradient would be expected with hyperkalemia (>7) and thus is suggestive of hypoaldosteronism as the cause of his hyperkalemia. Will order renin and aldosterone levels when he returns on 8/28.

## 2014-08-11 ENCOUNTER — Telehealth: Payer: Self-pay | Admitting: Internal Medicine

## 2014-08-11 ENCOUNTER — Ambulatory Visit: Payer: Medicare Other | Admitting: Internal Medicine

## 2014-08-11 NOTE — Telephone Encounter (Signed)
Spoke to pt in regards to his canceled appointment today. Pt reports he had a family member pass away and he has to go to the funeral. Pt was not able to pick up his Flomax and Kayexalate because he states it was going to cost him $200 and he cannot afford it. I stressed the importance of taking these medications because of patient's persistent hyperkalemia and risk of an arrhythmia. Pt understands the risks. He has agreed to follow-up with me on Monday 8/31 and has an appointment scheduled at 9:15 AM.

## 2014-08-14 ENCOUNTER — Ambulatory Visit (INDEPENDENT_AMBULATORY_CARE_PROVIDER_SITE_OTHER): Payer: Medicare Other | Admitting: Internal Medicine

## 2014-08-14 ENCOUNTER — Encounter: Payer: Self-pay | Admitting: Licensed Clinical Social Worker

## 2014-08-14 ENCOUNTER — Encounter: Payer: Self-pay | Admitting: Internal Medicine

## 2014-08-14 ENCOUNTER — Ambulatory Visit (HOSPITAL_COMMUNITY)
Admission: RE | Admit: 2014-08-14 | Discharge: 2014-08-14 | Disposition: A | Discharge status: Home / Self Care | Payer: Medicare Other | Source: Ambulatory Visit | Attending: Internal Medicine | Admitting: Internal Medicine

## 2014-08-14 VITALS — BP 179/98 | HR 76 | Temp 97.8°F | Ht 74.0 in | Wt 154.7 lb

## 2014-08-14 DIAGNOSIS — E875 Hyperkalemia: Secondary | ICD-10-CM

## 2014-08-14 DIAGNOSIS — F17200 Nicotine dependence, unspecified, uncomplicated: Secondary | ICD-10-CM

## 2014-08-14 DIAGNOSIS — Z0000 Encounter for general adult medical examination without abnormal findings: Secondary | ICD-10-CM | POA: Diagnosis not present

## 2014-08-14 DIAGNOSIS — N183 Chronic kidney disease, stage 3 unspecified: Secondary | ICD-10-CM | POA: Diagnosis not present

## 2014-08-14 DIAGNOSIS — Z9119 Patient's noncompliance with other medical treatment and regimen: Secondary | ICD-10-CM | POA: Insufficient documentation

## 2014-08-14 DIAGNOSIS — E538 Deficiency of other specified B group vitamins: Secondary | ICD-10-CM | POA: Diagnosis not present

## 2014-08-14 DIAGNOSIS — Z91199 Patient's noncompliance with other medical treatment and regimen due to unspecified reason: Secondary | ICD-10-CM | POA: Diagnosis not present

## 2014-08-14 DIAGNOSIS — I951 Orthostatic hypotension: Secondary | ICD-10-CM | POA: Diagnosis not present

## 2014-08-14 DIAGNOSIS — I129 Hypertensive chronic kidney disease with stage 1 through stage 4 chronic kidney disease, or unspecified chronic kidney disease: Secondary | ICD-10-CM | POA: Insufficient documentation

## 2014-08-14 DIAGNOSIS — I209 Angina pectoris, unspecified: Secondary | ICD-10-CM | POA: Insufficient documentation

## 2014-08-14 DIAGNOSIS — R634 Abnormal weight loss: Secondary | ICD-10-CM | POA: Diagnosis not present

## 2014-08-14 DIAGNOSIS — N179 Acute kidney failure, unspecified: Secondary | ICD-10-CM | POA: Diagnosis not present

## 2014-08-14 DIAGNOSIS — I10 Essential (primary) hypertension: Secondary | ICD-10-CM | POA: Diagnosis not present

## 2014-08-14 LAB — BASIC METABOLIC PANEL
BUN: 26 mg/dL — ABNORMAL HIGH (ref 6–23)
CALCIUM: 9.6 mg/dL (ref 8.4–10.5)
CO2: 23 mEq/L (ref 19–32)
Chloride: 110 mEq/L (ref 96–112)
Creat: 1.39 mg/dL — ABNORMAL HIGH (ref 0.50–1.35)
Glucose, Bld: 87 mg/dL (ref 70–99)
Potassium: 5.5 mEq/L — ABNORMAL HIGH (ref 3.5–5.3)
SODIUM: 144 meq/L (ref 135–145)

## 2014-08-14 MED ORDER — SODIUM POLYSTYRENE SULFONATE 15 GM/60ML PO SUSP
30.0000 g | Freq: Once | ORAL | Status: DC
  Filled 2014-08-14: qty 120

## 2014-08-14 MED ORDER — VARENICLINE TARTRATE 0.5 MG PO TABS: 0.5000 mg | ORAL_TABLET | Freq: Two times a day (BID) | ORAL | Status: DC

## 2014-08-14 MED ORDER — SODIUM POLYSTYRENE SULFONATE 15 GM/60ML PO SUSP
30.0000 g | Freq: Once | ORAL | Status: AC
  Administered 2014-08-14: 30 g via ORAL

## 2014-08-14 MED ORDER — TERAZOSIN HCL 1 MG PO CAPS: ORAL_CAPSULE | ORAL | Status: DC

## 2014-08-14 MED ORDER — TAMSULOSIN HCL 0.4 MG PO CAPS
0.4000 mg | ORAL_CAPSULE | Freq: Every day | ORAL | Status: DC
Start: 2014-08-14 — End: 2014-08-14

## 2014-08-14 NOTE — Progress Notes (Signed)
INTERNAL MEDICINE TEACHING ATTENDING ADDENDUM - Aldine Contes, MD: I personally saw and evaluated Mr. Gettman in this clinic visit in conjunction with the resident, Dr. Arcelia Jew. I have discussed patient's plan of care with medical resident during this visit. I have confirmed the physical exam findings and have read and agree with the clinic note including the plan with the following addition: - Patient here for follow up of hyperkalemia - Pt non compliant with kayexalate with elevated potassium today  - EKG with peaked T waves - Pt refuses hospital admission for observation - He agreed to take kayexalate at home and follow up in 2 days  - He understands the risks of refusing admission including death.  - f/u renal sono - TTKG calculated to be 3 indicative of hypoaldosteronism. Will f/u renin, aldo levels

## 2014-08-14 NOTE — Progress Notes (Signed)
Derrick Hendrix was referred to CSW as pt complained a particular medication at [place] was over $100.  Pt generally gets his medications filled at [facility].  CSW met with Derrick Hendrix during his scheduled [facility] appointment.  Pt states his medications were $3 generally.  Derrick Hendrix does not have prescription drug coverage, only Medicare A & B.  Pt received assistance following his hospital discharge to pay for medications.  CSW provided Derrick Hendrix with a GoodRx card for his Flomax and as of today it is under $20 at [facility].  Provided Derrick Hendrix information on the [place] program and encouraged pt to contact the [place] program today to see if he qualifies.  With Derrick Hendrix having no prescription coverage, pt would benefit from medications available through a prescription discount plan.

## 2014-08-15 DIAGNOSIS — E875 Hyperkalemia: Secondary | ICD-10-CM

## 2014-08-15 HISTORY — DX: Hyperkalemia: E87.5

## 2014-08-16 NOTE — Assessment & Plan Note (Signed)
Patient's TTKG of 3 calculated at last visit suggests hypoaldosteronism as cause of patient's hyperkalemia. Differential also includes urinary obstruction with BPH symptoms. Pt received a dose of kayexalate 30 mg in the clinic since he has been unable to afford this medication at the last two visits. He will get a renal ultrasound on Thursday 9/3. - f/u renal ultrasound results - repeat bmet at next visit - repeat ekg if K elevated - f/u renin/aldosterone levels - pt switched from tamsulosin to terazosin since it is $4/mo at [facility]. Pt agreed he could afford this medication. He was started on 1 mg for days 1-3, 2 mg for days 4-14. He will need to be uptitrated to 5 mg for weeks 2-6 and then 10 mg starting week 7. Please prescribe week 2-6 dose and educate patient on medication change.

## 2014-08-16 NOTE — Progress Notes (Signed)
   Subjective:    Patient ID: Derrick Hendrix, male    DOB: [birth year], 73 y.o.   MRN: [id number]  HPI Derrick Hendrix is a 72yo man w/ PMHx of HTN, stable angina, Stage III CKD, and smoking who presents for follow-up of his hyperkalemia. Refer to note on 8/26. Patient's K has been trending: 6.0>5.5>6.0>5.5 today. Patient refused admission at last visit on 8/26 even with EKG changes of increased peaked T waves. His TTKG was 3 indicating hypoaldosteronism as cause for hyperkalemia.   Today, patient's EKG shows peaked T waves similar to last visit. Pt still noncompliant with taking Kayexalate and Flomax. Patient was unable to afford these medications. He states he is still having difficulty starting a stream and that he is not completely emptying his bladder. He notes increased tiredness and muscle cramps in his legs at night. He has previously denied these symptoms. He denies nausea, vomiting, abdominal pain, chest pain, palpitations, SOB, dysuria, and hematuria.    Review of Systems General: Denies fever, chills, night sweats, changes in weight, changes in appetite HEENT: Denies headaches, ear pain, changes in vision, rhinorrhea, sore throat CV: Denies orthopnea Pulm: Denies cough, wheezing GI: Denies diarrhea, constipation, melena, hematochezia GU: See HPI Msk: Denies joint pains Neuro: Denies weakness, numbness, tingling Skin: Denies rashes, bruising    Objective:   Physical Exam General: appears stated age, sitting up in chair, NAD HEENT: [place]/AT, EOMI, PERRL, sclera anicteric, pharynx non-erythematous, mucus membranes moist Neck: supple, no JVD, no lymphadenopathy CV: RRR, normal S1/S2, no m/g/r Pulm: CTA bilaterally, breaths non-labored, no wheezing Abd: BS+, soft, non-distended, non-tender Ext: warm, no edema, moves all Neuro: alert and oriented x 3, CNs II-XII intact, strength 5/5 in upper and lower extremities bilaterally       Assessment & Plan:

## 2014-08-17 ENCOUNTER — Ambulatory Visit (HOSPITAL_COMMUNITY): Payer: Medicare Other

## 2014-08-17 ENCOUNTER — Ambulatory Visit: Payer: Medicare Other | Admitting: Internal Medicine

## 2014-08-17 ENCOUNTER — Encounter: Payer: Self-pay | Admitting: Internal Medicine

## 2014-08-17 NOTE — Progress Notes (Signed)
Internal Medicine Clinic Attending Date of visit: 08/14/2014  I saw and evaluated the patient.  I personally confirmed the key portions of the history and exam documented by Dr. Arcelia Jew and I reviewed pertinent patient test results.  The assessment, diagnosis, and plan were formulated together and I agree with the documentation in the resident's note.

## 2014-08-23 ENCOUNTER — Ambulatory Visit: Payer: Medicare Other | Admitting: Gastroenterology

## 2014-08-23 LAB — ALDOSTERONE + RENIN ACTIVITY W/ RATIO: PRA LC/MS/MS: 0.19 ng/mL/h — ABNORMAL LOW (ref 0.25–5.82)

## 2014-08-23 NOTE — Addendum Note (Signed)
Addended by: Hulan Fray on: 08/23/2014 05:01 PM   Modules accepted: Orders

## 2014-08-24 ENCOUNTER — Ambulatory Visit (INDEPENDENT_AMBULATORY_CARE_PROVIDER_SITE_OTHER): Payer: Medicare Other | Admitting: Internal Medicine

## 2014-08-24 ENCOUNTER — Encounter: Payer: Self-pay | Admitting: Internal Medicine

## 2014-08-24 ENCOUNTER — Observation Stay (HOSPITAL_COMMUNITY)
Admission: AD | Admit: 2014-08-24 | Discharge: 2014-08-26 | Disposition: A | Discharge status: Home / Self Care | Payer: Medicare Other | Source: Ambulatory Visit | Attending: Internal Medicine | Admitting: Internal Medicine

## 2014-08-24 ENCOUNTER — Ambulatory Visit (HOSPITAL_COMMUNITY)
Admission: RE | Admit: 2014-08-24 | Discharge: 2014-08-24 | Disposition: A | Discharge status: Home / Self Care | Payer: Medicare Other | Source: Ambulatory Visit | Attending: Internal Medicine | Admitting: Internal Medicine

## 2014-08-24 ENCOUNTER — Encounter (HOSPITAL_COMMUNITY): Payer: Self-pay | Admitting: Internal Medicine

## 2014-08-24 ENCOUNTER — Encounter: Payer: Self-pay | Admitting: Licensed Clinical Social Worker

## 2014-08-24 VITALS — BP 184/86 | HR 79 | Temp 97.1°F | Ht 74.0 in | Wt 155.3 lb

## 2014-08-24 DIAGNOSIS — E2749 Other adrenocortical insufficiency: Secondary | ICD-10-CM | POA: Diagnosis present

## 2014-08-24 DIAGNOSIS — I129 Hypertensive chronic kidney disease with stage 1 through stage 4 chronic kidney disease, or unspecified chronic kidney disease: Secondary | ICD-10-CM | POA: Insufficient documentation

## 2014-08-24 DIAGNOSIS — Z8673 Personal history of transient ischemic attack (TIA), and cerebral infarction without residual deficits: Secondary | ICD-10-CM | POA: Insufficient documentation

## 2014-08-24 DIAGNOSIS — I10 Essential (primary) hypertension: Secondary | ICD-10-CM

## 2014-08-24 DIAGNOSIS — Z23 Encounter for immunization: Secondary | ICD-10-CM | POA: Insufficient documentation

## 2014-08-24 DIAGNOSIS — N400 Enlarged prostate without lower urinary tract symptoms: Secondary | ICD-10-CM

## 2014-08-24 DIAGNOSIS — I739 Peripheral vascular disease, unspecified: Secondary | ICD-10-CM | POA: Diagnosis present

## 2014-08-24 DIAGNOSIS — Z0000 Encounter for general adult medical examination without abnormal findings: Secondary | ICD-10-CM | POA: Diagnosis not present

## 2014-08-24 DIAGNOSIS — F17200 Nicotine dependence, unspecified, uncomplicated: Secondary | ICD-10-CM | POA: Diagnosis present

## 2014-08-24 DIAGNOSIS — R519 Headache, unspecified: Secondary | ICD-10-CM | POA: Diagnosis present

## 2014-08-24 DIAGNOSIS — N183 Chronic kidney disease, stage 3 unspecified: Secondary | ICD-10-CM | POA: Insufficient documentation

## 2014-08-24 DIAGNOSIS — I209 Angina pectoris, unspecified: Secondary | ICD-10-CM | POA: Insufficient documentation

## 2014-08-24 DIAGNOSIS — N179 Acute kidney failure, unspecified: Secondary | ICD-10-CM | POA: Diagnosis not present

## 2014-08-24 DIAGNOSIS — R51 Headache: Secondary | ICD-10-CM

## 2014-08-24 DIAGNOSIS — E875 Hyperkalemia: Secondary | ICD-10-CM | POA: Diagnosis not present

## 2014-08-24 DIAGNOSIS — I208 Other forms of angina pectoris: Secondary | ICD-10-CM

## 2014-08-24 DIAGNOSIS — E538 Deficiency of other specified B group vitamins: Secondary | ICD-10-CM | POA: Diagnosis not present

## 2014-08-24 DIAGNOSIS — R634 Abnormal weight loss: Secondary | ICD-10-CM | POA: Diagnosis not present

## 2014-08-24 DIAGNOSIS — I70219 Atherosclerosis of native arteries of extremities with intermittent claudication, unspecified extremity: Secondary | ICD-10-CM | POA: Diagnosis present

## 2014-08-24 DIAGNOSIS — I951 Orthostatic hypotension: Secondary | ICD-10-CM | POA: Diagnosis not present

## 2014-08-24 HISTORY — DX: Cerebral infarction, unspecified: I63.9

## 2014-08-24 HISTORY — DX: Hyperkalemia: E87.5

## 2014-08-24 LAB — COMPREHENSIVE METABOLIC PANEL
ALBUMIN: 3.8 g/dL (ref 3.5–5.2)
ALK PHOS: 128 U/L — AB (ref 39–117)
ALT: 43 U/L (ref 0–53)
AST: 28 U/L (ref 0–37)
Anion gap: 16 — ABNORMAL HIGH (ref 5–15)
BUN: 23 mg/dL (ref 6–23)
CALCIUM: 8.8 mg/dL (ref 8.4–10.5)
CO2: 17 mEq/L — ABNORMAL LOW (ref 19–32)
Chloride: 107 mEq/L (ref 96–112)
Creatinine, Ser: 1.22 mg/dL (ref 0.50–1.35)
GFR calc Af Amer: 65 mL/min — ABNORMAL LOW (ref >90)
GFR calc non Af Amer: 56 mL/min — ABNORMAL LOW (ref >90)
Glucose, Bld: 225 mg/dL — ABNORMAL HIGH (ref 70–99)
POTASSIUM: 5 meq/L (ref 3.7–5.3)
SODIUM: 140 meq/L (ref 137–147)
TOTAL PROTEIN: 7.4 g/dL (ref 6.0–8.3)
Total Bilirubin: 0.2 mg/dL — ABNORMAL LOW (ref 0.3–1.2)

## 2014-08-24 LAB — BASIC METABOLIC PANEL WITH GFR
BUN: 23 mg/dL (ref 6–23)
CHLORIDE: 109 meq/L (ref 96–112)
CO2: 21 meq/L (ref 19–32)
Calcium: 9.7 mg/dL (ref 8.4–10.5)
Creat: 1.38 mg/dL — ABNORMAL HIGH (ref 0.50–1.35)
GFR, Est African American: 57 mL/min — ABNORMAL LOW
GFR, Est Non African American: 50 mL/min — ABNORMAL LOW
Glucose, Bld: 78 mg/dL (ref 70–99)
Potassium: 6.4 mEq/L (ref 3.5–5.3)
SODIUM: 142 meq/L (ref 135–145)

## 2014-08-24 LAB — CBC WITH DIFFERENTIAL/PLATELET
BASOS PCT: 1 % (ref 0–1)
Basophils Absolute: 0 10*3/uL (ref 0.0–0.1)
EOS ABS: 0.4 10*3/uL (ref 0.0–0.7)
Eosinophils Relative: 7 % — ABNORMAL HIGH (ref 0–5)
HEMATOCRIT: 32.1 % — AB (ref 39.0–52.0)
HEMOGLOBIN: 10.5 g/dL — AB (ref 13.0–17.0)
LYMPHS ABS: 1.2 10*3/uL (ref 0.7–4.0)
Lymphocytes Relative: 20 % (ref 12–46)
MCH: 32.6 pg (ref 26.0–34.0)
MCHC: 32.7 g/dL (ref 30.0–36.0)
MCV: 99.7 fL (ref 78.0–100.0)
MONO ABS: 0.4 10*3/uL (ref 0.1–1.0)
Monocytes Relative: 7 % (ref 3–12)
Neutro Abs: 3.9 10*3/uL (ref 1.7–7.7)
Neutrophils Relative %: 65 % (ref 43–77)
Platelets: 256 10*3/uL (ref 150–400)
RBC: 3.22 MIL/uL — AB (ref 4.22–5.81)
RDW: 13.7 % (ref 11.5–15.5)
WBC: 6.1 10*3/uL (ref 4.0–10.5)

## 2014-08-24 LAB — TROPONIN I: Troponin I: <0.3 ng/mL (ref <0.30)

## 2014-08-24 MED ORDER — NITROGLYCERIN 0.4 MG SL SUBL
0.4000 mg | SUBLINGUAL_TABLET | SUBLINGUAL | Status: DC | PRN
Start: 2014-08-24 — End: 2014-08-26

## 2014-08-24 MED ORDER — TERAZOSIN HCL 1 MG PO CAPS: 1.0000 mg | ORAL_CAPSULE | Freq: Every day | ORAL | Status: DC

## 2014-08-24 MED ORDER — SODIUM CHLORIDE 0.9 % IJ SOLN
3.0000 mL | Freq: Two times a day (BID) | INTRAMUSCULAR | Status: DC
  Administered 2014-08-24 – 2014-08-26 (×5): 3 mL via INTRAVENOUS

## 2014-08-24 MED ORDER — AMLODIPINE BESYLATE 5 MG PO TABS: 5.0000 mg | ORAL_TABLET | Freq: Every day | ORAL | Status: DC

## 2014-08-24 MED ORDER — SODIUM CHLORIDE 0.9 % IV SOLN
1.0000 g | Freq: Once | INTRAVENOUS | Status: AC
  Administered 2014-08-24: 1 g via INTRAVENOUS
  Filled 2014-08-24 (×2): qty 10

## 2014-08-24 MED ORDER — SODIUM POLYSTYRENE SULFONATE 15 GM/60ML PO SUSP
30.0000 g | Freq: Once | ORAL | Status: DC
  Filled 2014-08-24: qty 120

## 2014-08-24 MED ORDER — HYDROCHLOROTHIAZIDE 12.5 MG PO CAPS: 25.0000 mg | ORAL_CAPSULE | Freq: Every day | ORAL | Status: DC

## 2014-08-24 MED ORDER — DEXTROSE 50 % IV SOLN
2.0000 | Freq: Once | INTRAVENOUS | Status: AC
  Administered 2014-08-24: 100 mL via INTRAVENOUS
  Filled 2014-08-24: qty 100

## 2014-08-24 MED ORDER — INSULIN ASPART 100 UNIT/ML ~~LOC~~ SOLN
10.0000 [IU] | Freq: Once | SUBCUTANEOUS | Status: AC
  Administered 2014-08-24: 10 [IU] via SUBCUTANEOUS

## 2014-08-24 MED ORDER — CARVEDILOL 6.25 MG PO TABS
6.2500 mg | ORAL_TABLET | Freq: Two times a day (BID) | ORAL | Status: DC
  Administered 2014-08-24 – 2014-08-26 (×4): 6.25 mg via ORAL
  Filled 2014-08-24 (×6): qty 1

## 2014-08-24 MED ORDER — AMLODIPINE BESYLATE 5 MG PO TABS
5.0000 mg | ORAL_TABLET | Freq: Every day | ORAL | Status: DC
  Administered 2014-08-25 – 2014-08-26 (×2): 5 mg via ORAL
  Filled 2014-08-24 (×2): qty 1

## 2014-08-24 MED ORDER — ATORVASTATIN CALCIUM 80 MG PO TABS
80.0000 mg | ORAL_TABLET | Freq: Every day | ORAL | Status: DC
  Administered 2014-08-24 – 2014-08-25 (×2): 80 mg via ORAL
  Filled 2014-08-24 (×3): qty 1

## 2014-08-24 MED ORDER — ACETAMINOPHEN 325 MG PO TABS
650.0000 mg | ORAL_TABLET | Freq: Four times a day (QID) | ORAL | Status: DC | PRN
Start: 2014-08-24 — End: 2014-08-26
  Administered 2014-08-24: 650 mg via ORAL
  Filled 2014-08-24: qty 2

## 2014-08-24 MED ORDER — VITAMIN B-12 1000 MCG PO TABS
1000.0000 ug | ORAL_TABLET | Freq: Every day | ORAL | Status: DC
  Administered 2014-08-25 – 2014-08-26 (×2): 1000 ug via ORAL
  Filled 2014-08-24 (×3): qty 1

## 2014-08-24 MED ORDER — CARVEDILOL 6.25 MG PO TABS: 6.2500 mg | ORAL_TABLET | Freq: Two times a day (BID) | ORAL | Status: DC

## 2014-08-24 MED ORDER — TERAZOSIN HCL 1 MG PO CAPS
1.0000 mg | ORAL_CAPSULE | Freq: Every day | ORAL | Status: DC
  Administered 2014-08-24 – 2014-08-25 (×2): 1 mg via ORAL
  Filled 2014-08-24 (×3): qty 1

## 2014-08-24 MED ORDER — CILOSTAZOL 100 MG PO TABS
100.0000 mg | ORAL_TABLET | Freq: Two times a day (BID) | ORAL | Status: DC
  Administered 2014-08-24 – 2014-08-26 (×4): 100 mg via ORAL
  Filled 2014-08-24 (×5): qty 1

## 2014-08-24 MED ORDER — DEXTROSE 50 % IV SOLN: 1.0000 | Freq: Once | INTRAVENOUS | Status: DC

## 2014-08-24 MED ORDER — ENOXAPARIN SODIUM 40 MG/0.4ML ~~LOC~~ SOLN
40.0000 mg | SUBCUTANEOUS | Status: DC
  Administered 2014-08-24 – 2014-08-25 (×2): 40 mg via SUBCUTANEOUS
  Filled 2014-08-24 (×3): qty 0.4

## 2014-08-24 MED ORDER — ATORVASTATIN CALCIUM 80 MG PO TABS: 80.0000 mg | ORAL_TABLET | Freq: Every day | ORAL | Status: DC

## 2014-08-24 MED ORDER — POLYETHYLENE GLYCOL 3350 17 G PO PACK: 17.0000 g | PACK | Freq: Every day | ORAL | Status: DC

## 2014-08-24 MED ORDER — SODIUM POLYSTYRENE SULFONATE 15 GM/60ML PO SUSP
30.0000 g | Freq: Once | ORAL | Status: AC
  Administered 2014-08-24: 30 g via ORAL

## 2014-08-24 MED ORDER — AMLODIPINE BESYLATE 10 MG PO TABS: 10.0000 mg | ORAL_TABLET | Freq: Every day | ORAL | Status: DC

## 2014-08-24 MED ORDER — INSULIN ASPART 100 UNIT/ML ~~LOC~~ SOLN: 10.0000 [IU] | Freq: Once | SUBCUTANEOUS | Status: DC

## 2014-08-24 MED ORDER — HYDROCHLOROTHIAZIDE 12.5 MG PO CAPS
25.0000 mg | ORAL_CAPSULE | Freq: Every day | ORAL | Status: DC
  Administered 2014-08-25 – 2014-08-26 (×2): 25 mg via ORAL
  Filled 2014-08-24 (×2): qty 2

## 2014-08-24 NOTE — H&P (Signed)
Date: 08/24/2014               Patient Name:  Derrick Hendrix MRN: [id number]  DOB: 1941/06/24 Age / Sex: 73 y.o., male   PCP: Luan Moore, MD              Medical Service: Internal Medicine Teaching Service              Attending Physician: Dr. Axel Filler, MD      Chief Complaint: Hyperkalemia  History of Present Illness: Derrick Hendrix is a 73 yo male with a PMH of HTN, stable angina, CKD stage III, tobacco abuse, and hyperkalemia who presented as an admission from clinic with hyperkalemia with EKG changes.   Patient was previously seen on 8/14 and had a potassium of 6.0, on 8/18 K 5.5, 8/26 K 6.0, and on 8/31 K of 5.5. Each time patient had EKG changes with peaked T waves in leads V3-V6, but patient was asymptomatic. He was prescribed home kayexalate, which he reports he does not take because he cannot afford it.  He reports that the only medications he took  today were norvasc 10 mg (twice the prescribed dose) and ciolstazol 200 mg bid.  Today, patient's potassium is 6.4 and he has peaked T waves in leads V3-V6 unchanged from prior. Today he was given given Kayexalate 30mg , 2 amp of D50, 10 units of insulin, and calcium gluconate.  Patient denies any chest pain, palpitations, rapid heart rate, shortness of breath, confusion, or myalgias.  He reports having 2 BMs since administration of kayexolate.  He also reports some brief hand and foot cramps.  A full ROS is otherwise negative.   Meds: Current Facility-Administered Medications  Medication Dose Route Frequency Provider Last Rate Last Dose  . acetaminophen (TYLENOL) tablet 650 mg  650 mg Oral Q6H PRN Cresenciano Genre, MD   650 mg at 08/24/14 1637  . [START ON 08/25/2014] amLODipine (NORVASC) tablet 5 mg  5 mg Oral Daily Cresenciano Genre, MD      . atorvastatin (LIPITOR) tablet 80 mg  80 mg Oral q1800 Cresenciano Genre, MD      . calcium gluconate 1 g in sodium chloride 0.9 % 100 mL IVPB  1 g Intravenous Once Cresenciano Genre, MD        . carvedilol (COREG) tablet 6.25 mg  6.25 mg Oral BID WC Cresenciano Genre, MD   6.25 mg at 08/24/14 1629  . cilostazol (PLETAL) tablet 100 mg  100 mg Oral BID Cresenciano Genre, MD      . enoxaparin (LOVENOX) injection 40 mg  40 mg Subcutaneous Q24H Cresenciano Genre, MD   40 mg at 08/24/14 1629  . [START ON 08/25/2014] hydrochlorothiazide (MICROZIDE) capsule 25 mg  25 mg Oral Daily Cresenciano Genre, MD      . nitroGLYCERIN (NITROSTAT) SL tablet 0.4 mg  0.4 mg Sublingual Q5 Min x 3 PRN Cresenciano Genre, MD      . sodium chloride 0.9 % injection 3 mL  3 mL Intravenous Q12H Cresenciano Genre, MD   3 mL at 08/24/14 1558  . terazosin (HYTRIN) capsule 1 mg  1 mg Oral QHS Cresenciano Genre, MD      . Derrill Memo ON 08/25/2014] vitamin B-12 (CYANOCOBALAMIN) tablet 1,000 mcg  1,000 mcg Oral Daily Cresenciano Genre, MD       Facility-Administered Medications Ordered in Other Encounters  Medication Dose Route Frequency Provider Last Rate  Last Dose  . polyethylene glycol (MIRALAX / GLYCOLAX) packet 17 g  17 g Oral Daily Alexa Richardson, MD      . sodium polystyrene (KAYEXALATE) 15 GM/60ML suspension 30 g  30 g Oral Once Bartholomew Crews, MD        Allergies: Allergies as of 08/24/2014  . (No Known Allergies)   Past Medical History  Diagnosis Date  . Hypertension   . Headache(784.0)    No past surgical history on file. Family History  Problem Relation Age of Onset  . Hypertension Mother   . Diabetes Mother    History   Social History  . Marital Status: Single    Spouse Name: N/A    Number of Children: N/A  . Years of Education: N/A   Occupational History  . Not on file.   Social History Main Topics  . Smoking status: Current Every Day Smoker -- 1.00 packs/day for 40 years    Types: Cigarettes  . Smokeless tobacco: Not on file     Comment: but hoping to quit one day  . Alcohol Use: 5.4 oz/week    9 Cans of beer per week     Comment: beer  . Drug Use: No  . Sexual Activity: Not on file   Other Topics  Concern  . Not on file   Social History Narrative   Works in the yard          Review of Systems: Pertinent items are noted in HPI.  Physical Exam: Blood pressure 181/76, pulse 70, temperature 98.1 F (36.7 C), temperature source Oral, resp. rate 16, SpO2 98.00%.  General: Responsive and in NAD HEENT: MMM. PERLLA. No lymphedema or masses CV: RRR, no murmurs, rubs, or gallops Pulm: CTA bilaterally, no wheezes, crackles, or ronchi Abd:  NBS in 4Q.  Non-tender to deep and light palpation Ext: No edema or swelling.  Pulses 2+ bilaterally Skin: dry, no suspicious rashes or lesions Neuro: A and A x3. CN grossly intact   Past Labs: Aldosterone level <1  PRA 0.19  Aldo/PRA ratio was unable to be calculated because aldosterone levels was so low  Urine potassium level 31  Urine osm 584  TTKG was 3  Assessment: Derrick Hendrix is a 73 yo male with a PMH of HTN, stable angina, CKD stage III, tobacco abuse, and hyperkalemia who presented as an admission from clinic with hyperkalemia with EKG changes.  Stable  Pln: Principal Problem:   Hyperkalemia Active Problems:   Headache   Essential hypertension, benign   Current smoker   Hyporeninemic hypoaldosteronism  #Hyperkalemia 2/2 hyporeninemic hypoaldosteronism: Patient's chronic hyperkalemia is most likely due to hyporeninemic hypoaldosteronism. Patient aldosterone level was <1, PRA low at 0.19, and aldo/PRA ratio was unable to be calculated because aldosterone levels was so low. Urine potassium level was 31 and urine osm was 584. TTKG was 3. These results are indicative of hyporeninemic hypoaldosteronism which would explain his chronic hyperkalemia. EKG today showed peaked T waves indicative of hyperkalemia.  K+on admission 6.4.  Repeat K+ 5.0. - s/p insulin, calcium gluconate, kayexolate - CTM telemetry - repeat am labs, EKG - Recommended treatment for hyporeninemic hypoaldosteronism includes florinef; will hold at this time due to  hypertension - HCTZ 25 mg po qd  #Essential Hypertension:  - norvasc 5,g po qd - coreg 6.35 mg po qd - HCTZ 25 mg po qd - terazosin 1mg  po qhs  #HLD: - atorvastatin 80mg  po qd  #PVD: -cilostazol 100mg  po bid  #  Angina: Nitroglycerin .4 mg sublingual every 5 minutes x3 PRN  #B12 deficiency: Vitamin B12 1071mcg po qd  PPX: enoxaparin 40mg  sq qd  This is a Careers information officer Note.  The care of the patient was discussed with Dr. Aundra Dubin and the assessment and plan was formulated with their assistance.  Please see their note for official documentation of the patient encounter.   Signed: Charise Killian, Med Student 08/24/2014, 4:39 PM

## 2014-08-24 NOTE — Progress Notes (Signed)
Patient ID: Derrick Hendrix, male   DOB: 01-Jul-1941, 73 y.o.   MRN: [id number] Internal Medicine Clinic Attending Date of visit: 08/24/2014  The patient has been followed up for hyperkalemia. We have ordered stat labs today to recheck it, he is asymptomatic however has an abnormal EKG with peaked T waves in precordial leads. He insists that he does not want to stay and wait for the results even after being explained the risks of hyperkalemia. He will sign the AMA form before leaving the clinic. He will be informed about his results and will be called back to the clinic if needed, to administer treatment as appropriate.   ADDENDUM Patient's K is 6.4. Patient should ideally be monitored in the ER/direct admit observe, given EKG changes, be on telemetry. He refuses that option despite being told the risks involved. When he comes to clinic, he will be given Kayexalate, 1 amp of D50 and 10 units of insulin. He will be asked to wait, so repeat POC CBG and BMP results can be drawn. If he does not want to do this, we will request him to sign the AMA form. Also, he will be called back to the clinic to get labs done tomorrow morning 8 am.   Today we have decreased his amlodipine to 5 mg and added HCTZ 25 mg to his BP regimen. We will start him on fludrocortisone today.   I saw and evaluated the patient.  I personally confirmed the key portions of the history and exam documented by Dr. Marvel Plan and I reviewed pertinent patient test results.  The assessment, diagnosis, and plan were formulated together and I agree with the documentation in the resident's note.

## 2014-08-24 NOTE — Assessment & Plan Note (Signed)
BP Readings from Last 3 Encounters:  08/24/14 181/76  08/24/14 184/86  08/14/14 179/98    Lab Results  Component Value Date   NA 142 08/24/2014   K 6.4* 08/24/2014   CREATININE 1.38* 08/24/2014    Assessment: Blood pressure control:  Uncontrolled Progress toward BP goal:   Detiorated Comments: Patient BP was elevated today at 181/76. He admitted to headache and reported he had not taken his amlodipine yet. He was supposed to be taking amlodipine 10 mg daily, but misunderstood and only was taking 5 mg daily.   Plan: Medications:  We recommend keeping amlodipine at 5 mg daily and adding HCTZ 25 mg daily for treatment of hyperkalemia and for hypertension. Educational resources provided:  None Self management tools provided:  None Other plans: I instructed the patient to take his amlodipine 10 mg in the clinic since he had not taken it yet today and he was hypertensive. Patient is being admitted to the hospital for treatment of hyperkalemia where his blood pressure can also be monitored. His blood pressure will need to be normalized before initiating fludrocortisone since this will likely increase his blood pressure.

## 2014-08-24 NOTE — Progress Notes (Signed)
Subjective:    Patient ID: Derrick Hendrix, male    DOB: 10/30/1941, 73 y.o.   MRN: [id number]  HPI Derrick Hendrix in a 73 yo male with PMHx of HTN, stable angina, CKD stage III, tobacco abuse and hyperkalemia who presents to the clinic for follow up with his hyperkalemia. Patient was previously seen on 8/14 and had a potassium of 6.0, on 8/18 K 5.5, 8/26 K 6.0, and on 8/31 K of 5.5. Each time patient had EKG changes with peaked T waves in leads V3-V6, but patient was asymptomatic. He was given kayexelate 30 mg in the clinic and prescribed home kayexalate and told to follow up on 9/3. Patient missed his appointment on 9/3 and is following up today. Patient was unable to get kayexalate because it was too expensive. He also failed to pick up his prescription for terazosin because he is unable to get to [facility] due to transportation issues. He needs all of his prescriptions to be at [facility] on [address]. Patient's chronic hyperkalemia is most likely due to hyporeninemic hypoaldosteronism. Patient aldosterone level was <1, PRA low at 0.19, and aldo/PRA ratio was unable to be calculated because aldosterone levels was so low. Urine potassium level was 31 and urine osm was 584. TTKG was 3. These results are indicative of hyporeninemic hypoaldosteronism which would explain his chronic hyperkalemia. These results were explained to the patient.  Today, patient's potassium is 6.4 and he has peaked T waves in leads V3-V6 unchanged from prior. Patient denies any chest pain, palpitations, rapid heart rate, shortness of breath, confusion, or myalgias. Patient left AMA from the clinic before his potassium result was back. We called him with his result and recommended that he go immediately to the ED or return to clinic. He came back to the clinic and after further discussion with the patient, he agreed to be admitted to the hospital for treatment for hyperkalemia and for initiation of treatment for hyporeninemic  hypoaldosteronism.   Review of Systems General: Denies fever, chills, fatigue, change in appetite and diaphoresis.  Respiratory: Denies SOB, DOE, chest tightness, and wheezing.   Cardiovascular: Denies chest pain and palpitations.  Gastrointestinal: Denies nausea, vomiting, abdominal pain, diarrhea, constipation, blood in stool and abdominal distention.  Genitourinary: Denies dysuria, urgency, frequency, hematuria, suprapubic pain and flank pain. Endocrine: Denies hot or cold intolerance, polyuria, and polydipsia. Musculoskeletal: Denies myalgias, back pain, joint swelling, arthralgias and gait problem.  Skin: Denies pallor, rash and wounds.  Neurological: Admits to headache. Denies dizziness, weakness, lightheadedness, numbness,seizures, and syncope, Psychiatric/Behavioral: Denies mood changes, confusion, nervousness, sleep disturbance and agitation.     Objective:   Physical Exam Filed Vitals:   08/24/14 0927  BP: 184/86  Pulse: 79  Temp: 97.1 F (36.2 C)  TempSrc: Oral  Height: 6\' 2"  (1.88 m)  Weight: 155 lb 4.8 oz (70.444 kg)  SpO2: 100%   General: Vital signs reviewed.  Patient is well-developed and well-nourished, in no acute distress and cooperative with exam.  Cardiovascular: RRR, S1 normal, S2 normal, no murmurs, gallops, or rubs. Pulmonary/Chest: Clear to auscultation bilaterally, no wheezes, rales, or rhonchi. Abdominal: Soft, non-tender, non-distended, BS +, no masses, organomegaly, or guarding present.  Musculoskeletal: No joint deformities, erythema, or stiffness, ROM full and nontender. Extremities: No lower extremity edema bilaterally,  pulses symmetric and intact bilaterally. No cyanosis or clubbing. Skin: Warm, dry and intact. No rashes or erythema. Psychiatric: Normal mood and affect. speech and behavior is normal. Cognition and memory are normal.  Assessment & Plan:   Please see problem based assessment and plan.

## 2014-08-24 NOTE — Progress Notes (Signed)
Report called to [place] [address].  Patient transported via wheelchair to [place] [facility].  Pt alert and oriented.  Saline lock in left hand.  Sander Nephew, RN 08/24/2014 2:49 PM.

## 2014-08-24 NOTE — Progress Notes (Signed)
CSW had the opportunity of meeting with Derrick Hendrix again during this scheduled [facility] appointment.  Unfortunately, Derrick Hendrix was unable to contact [place] regarding the [place] Program for medication assistance.  Pt continues to state medications being too expensive.  Pt states his traditional Medicare pays 80% of his medications.  Pt was provided GoodRx today to assist with medication cost.  Derrick Hendrix states he has no telephone as he keep losing them.  Pt utilizes friend's phone number for his contact.  It is this same friend that he pays to bring him to doctor appointments.  Derrick Hendrix states he lets the friend know of appointments when she is with him.  CSW discussed senior transportation available through Database administrator, pt in agreement for CSW to complete application.  However, it may be difficult for pt to utilize without having a phone.  In addition, Derrick Hendrix is in agreement for referral to [facility] to assist with medication education.  CSW has requested [facility] to engage pt during his next scheduled [facility] appointment.

## 2014-08-24 NOTE — Patient Instructions (Signed)
General Instructions:   Thank you for bringing your medicines today. This helps [place] keep you safe from mistakes.  Hyperkalemia Hyperkalemia is when you have too much potassium in your blood. This can be a life-threatening condition. Potassium is normally removed (excreted) from the body by the kidneys. CAUSES  The potassium level in your body can become too high for the following reasons:  You take in too much potassium. You can do this by:  Using salt substitutes. They contain large amounts of potassium.  Taking potassium supplements from your caregiver. The dose may be too high for you.  Eating foods or taking nutritional products with potassium.  You excrete too little potassium. This can happen if:  Your kidneys are not functioning properly. Kidney (renal) disease is a very common cause of hyperkalemia.  You are taking medicines that lower your excretion of potassium, such as certain diuretic medicines.  You have an adrenal gland disease called Addison's disease.  You have a urinary tract obstruction, such as kidney stones.  You are on treatment to mechanically clean your blood (dialysis) and you skip a treatment.  You release a high amount of potassium from your cells into your blood. You may have a condition that causes potassium to move from your cells to your bloodstream. This can happen with:  Injury to muscles or other tissues. Most potassium is stored in the muscles.  Severe burns or infections.  Acidic blood plasma (acidosis). Acidosis can result from many diseases, such as uncontrolled diabetes. SYMPTOMS  Usually, there are no symptoms unless the potassium is dangerously high or has risen very quickly. Symptoms may include:  Irregular or very slow heartbeat.  Feeling sick to your stomach (nauseous).  Tiredness (fatigue).  Nerve problems such as tingling of the skin, numbness of the hands or feet, weakness, or paralysis. DIAGNOSIS  A simple blood test can  measure the amount of potassium in your body. An electrocardiogram test of the heart can also help make the diagnosis. The heart may beat dangerously fast or slow down and stop beating with severe hyperkalemia.  TREATMENT  Treatment depends on how bad the condition is and on the underlying cause.  If the hyperkalemia is an emergency (causing heart problems or paralysis), many different medicines can be used alone or together to lower the potassium level briefly. This may include an insulin injection even if you are not diabetic. Emergency dialysis may be needed to remove potassium from the body.  If the hyperkalemia is less severe or dangerous, the underlying cause is treated. This can include taking medicines if needed. Your prescription medicines may be changed. You may also need to take a medicine to help your body get rid of potassium. You may need to eat a diet low in potassium. HOME CARE INSTRUCTIONS   Take medicines and supplements as directed by your caregiver.  Do not take any over-the-counter medicines, supplements, natural products, herbs, or vitamins without reviewing them with your caregiver. Certain supplements and natural food products can have high amounts of potassium. Other products (such as ibuprofen) can damage weak kidneys and raise your potassium.  You may be asked to do repeat lab tests. Be sure to follow these directions.  If you have kidney disease, you may need to follow a low potassium diet. SEEK MEDICAL CARE IF:   You notice an irregular or very slow heartbeat.  You feel lightheaded.  You develop weakness that is unusual for you. SEEK IMMEDIATE MEDICAL CARE IF:   You  have shortness of breath.  You have chest discomfort.  You pass out (faint). MAKE SURE YOU:   Understand these instructions.  Will watch your condition.  Will get help right away if you are not doing well or get worse. Document Released: 11/21/2002 Document Revised: 02/23/2012 Document  Reviewed: 03/08/2014 [facility] Patient Information 2015 [place], [place]. This information is not intended to replace advice given to you by your health care provider. Make sure you discuss any questions you have with your health care provider.

## 2014-08-24 NOTE — H&P (Signed)
Date: 08/24/2014               Patient Name:  Derrick Hendrix MRN: [id number]  DOB: 12-Nov-1941 Age / Sex: 73 y.o., male   PCP: Luan Moore, MD         Medical Service: Internal Medicine Teaching Service         Attending Physician: Dr. Axel Filler, MD    First Contact: MS 4 Charise Killian Pager: [phone]  Second Contact: Dr. Aundra Dubin  Pager: [phone]       After Hours (After 5p/  First Contact Pager: [phone]  weekends / holidays): Second Contact Pager: [phone]   Chief Complaint: Hyperkalemia with peaked T waves on EKG  History of Present Illness: 72 y.o PMH HTN, headaches, tobacco abuse, PAD, CKD3, stable angina, suspected BPH. He presented to the [facility] to follow up on hyperkalemia noted since 07/28/2014 K has been fluctuating 6.0 on 8/14, 5.5 on 8/18, 6.0 on 8/26, 5.5 on 8/31. Today his K was 6.4 with peaked T waves in V3-V6 (with h/o peaked T waves noted on previous EKGs and he was asymptomatic.  He was prescribed Kayexylate on an outpatient basis but was not able to afford.  He was given Kayexylate 30 grams x 1 in clinic and advised admission to the hospital for hyperkalemia.  Denies nausea, weakness, parathesia, palpitations, chest pain, sob, myalgias but did have muscle cramps in and legs after given an iv medication once admitted.        Meds: Of note medications below he is not taking Chantix  Medications Prior to Admission  Medication Sig Dispense Refill  . amLODipine (NORVASC) 5 MG tablet Take 1 tablet (5 mg total) by mouth daily.  30 tablet  11  . atorvastatin (LIPITOR) 80 MG tablet Take 1 tablet (80 mg total) by mouth daily.  30 tablet  11  . carvedilol (COREG) 6.25 MG tablet Take 1 tablet (6.25 mg total) by mouth 2 (two) times daily.  60 tablet  11  . cilostazol (PLETAL) 100 MG tablet Take 1 tablet (100 mg total) by mouth 2 (two) times daily.  60 tablet  3  . hydrochlorothiazide (HYDRODIURIL) 12.5 MG tablet Take 25 mg by mouth 2 (two) times daily.      [name] terazosin (HYTRIN) 1  MG capsule Take 1 capsule (1 mg total) by mouth at bedtime.  30 capsule  3  . varenicline (CHANTIX) 0.5 MG tablet Take 1 tablet (0.5 mg total) by mouth 2 (two) times daily. Take 1 tablet once per day for 3 days, then take 1 tablet twice a day.  60 tablet  2  . vitamin B-12 (CYANOCOBALAMIN) 1000 MCG tablet Take 1 tablet (1,000 mcg total) by mouth daily.  14 tablet  0  . nitroGLYCERIN (NITROSTAT) 0.4 MG SL tablet Place 1 tablet (0.4 mg total) under the tongue every 5 (five) minutes x 3 doses as needed for chest pain.  30 tablet  3   Current Facility-Administered Medications  Medication Dose Route Frequency Provider Last Rate Last Dose  . acetaminophen (TYLENOL) tablet 650 mg  650 mg Oral Q6H PRN Cresenciano Genre, MD   650 mg at 08/24/14 1637  . [START ON 08/25/2014] amLODipine (NORVASC) tablet 5 mg  5 mg Oral Daily Cresenciano Genre, MD      . atorvastatin (LIPITOR) tablet 80 mg  80 mg Oral q1800 Cresenciano Genre, MD   80 mg at 08/24/14 1830  . carvedilol (COREG) tablet 6.25 mg  6.25 mg Oral BID WC Cresenciano Genre, MD   6.25 mg at 08/24/14 1629  . cilostazol (PLETAL) tablet 100 mg  100 mg Oral BID Cresenciano Genre, MD   100 mg at 08/24/14 2201  . enoxaparin (LOVENOX) injection 40 mg  40 mg Subcutaneous Q24H Cresenciano Genre, MD   40 mg at 08/24/14 1629  . [START ON 08/25/2014] hydrochlorothiazide (MICROZIDE) capsule 25 mg  25 mg Oral Daily Cresenciano Genre, MD      . nitroGLYCERIN (NITROSTAT) SL tablet 0.4 mg  0.4 mg Sublingual Q5 Min x 3 PRN Cresenciano Genre, MD      . sodium chloride 0.9 % injection 3 mL  3 mL Intravenous Q12H Cresenciano Genre, MD   3 mL at 08/24/14 2201  . terazosin (HYTRIN) capsule 1 mg  1 mg Oral QHS Cresenciano Genre, MD   1 mg at 08/24/14 2201  . [START ON 08/25/2014] vitamin B-12 (CYANOCOBALAMIN) tablet 1,000 mcg  1,000 mcg Oral Daily Cresenciano Genre, MD       Facility-Administered Medications Ordered in Other Encounters  Medication Dose Route Frequency Provider Last Rate Last Dose  . sodium  polystyrene (KAYEXALATE) 15 GM/60ML suspension 30 g  30 g Oral Once Bartholomew Crews, MD        Allergies: Allergies as of 08/24/2014 - Review Complete 08/24/2014  Allergen Reaction Noted  . Aspirin  08/24/2014   Past Medical History  Diagnosis Date  . Hypertension   . Headache(784.0)    No past surgical history on file. Family History  Problem Relation Age of Onset  . Hypertension Mother     died age 74  . Diabetes Mother    History   Social History  . Marital Status: Single    Spouse Name: N/A    Number of Children: N/A  . Years of Education: N/A   Occupational History  . Not on file.   Social History Main Topics  . Smoking status: Current Every Day Smoker -- 1.00 packs/day for 40 years    Types: Cigarettes  . Smokeless tobacco: Not on file     Comment: but hoping to quit one day  . Alcohol Use: 5.4 oz/week    9 Cans of beer per week     Comment: beer  . Drug Use: No  . Sexual Activity: Not on file   Other Topics Concern  . 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 rate 16, SpO2 98%. Vitals reviewed. General: resting in bed, NAD HEENT: PERRL, EOMI, no scleral icterus Cardiac: RRR, no rubs, murmurs or gallops Pulm: clear to auscultation bilaterally, no wheezes, rales, or rhonchi Abd: soft, nontender, nondistended, BS present Ext: warm and well perfused, no pedal edema Neuro: alert and oriented X3, cranial nerves II-XII grossly  intact   Lab results: Basic Metabolic Panel:  Recent Labs  08/24/14 0929 08/24/14 1600  NA 142 140  K 6.4* 5.0  CL 109 107  CO2 21 17*  GLUCOSE 78 225*  BUN 23 23  CREATININE 1.38* 1.22  CALCIUM 9.7 8.8   Liver Function Tests:  Recent Labs  08/24/14 1600  AST 28  ALT 43  ALKPHOS 128*  BILITOT 0.2*  PROT 7.4  ALBUMIN 3.8   CBC:  Recent Labs  08/24/14 1600  WBC 6.1  NEUTROABS 3.9  HGB 10.5*  HCT 32.1*  MCV 99.7  PLT 256   Cardiac Enzymes:  Recent Labs  08/24/14 1600  TROPONINI <0.30   Misc. Labs: none  Imaging results:  No results found.  Other results: EKG: NSR, nl intervals, peaked T waves 2,3, V3-V6, LVH, TWI AVL  Assessment & Plan by Problem: 73 y.o w/ hyperkalemia with peaked T waves on EKG.    #Hyperkalemia -Likely secondary to  hyporeninemic hypoaldosteronism from recent w/u. Creatinine at baseline and he does not appear to be on any medications which would be K sparing -Initially with peaked T waves on EKG V3-V6 which peaked T waves had been noted on previous EKGs and unchanged.  K was 6.4 trended down to 5.0 -Patient aldosterone level was <1, PRA low at 0.19, and aldo/PRA ratio was unable to be calculated because aldosterone levels was so low. Urine potassium level was 31 and urine osm was 584. TTKG was 3. These results are indicative of hyporeninemic hypoaldosteronism which would explain his chronic hyperkalemia -Given Kayexylate 30 g in clinic x1 (pt responded with 2 stools), given Calcium gluconate  gram x 1, given D50 2 amps x 1, Novolog 10 units -trend EKG and BMET in the am   -pt was not taking Chantix but rec avoid in future w/ side effect of hyperK -Consider fludrocortisone 0.2 mg to 1 mg daily for treatment of hyporeninemic hypoaldosteronism though currently BP uncontrolled so caution but due to elevated BP he will be continued HCTZ (dose recently increased) which is another tx option for hyporenin hypoaldosteronism   # Essential  hypertension, uncontrolled -pt had not taken home BP medications today. Initially BP 181/76 -Resumed home BP medications Norvasc 5 mg qd (took 10 mg in clinic per documentation today), Coreg 6.25 mg bid, HCTZ 25 mg qd (recently increased dose in clinic), Hytrin 1 mg qhs (recently added) in clinic  -monitor BP  #Headache -resolved with Tylenol prn  #Eosinophilia -Appears chronic, unclear etiology -repeat CBC with diff in the am   #Atherosclerosis of leg with intermittent claudication -resumed Cilostazol  -encouraged smoking cessation.   #HLD -Continued Lipitor 80 mg qhs home dose   #Current smoker -smoking cessation  #F/E/N -NSL -BMET in the am  -renal diet   #DVT px  -Lovenox, scds  Dispo: Disposition is deferred at this time, awaiting improvement of current medical problems. Anticipated discharge in approximately 1day(s).   The patient does have a current PCP Luan Moore, MD) and does need an [facility] hospital follow-up appointment after discharge.  The patient does not have transportation limitations that hinder transportation to clinic appointments.  Signed: Cresenciano Genre, MD  [phone] 08/24/2014, 11:18 PM

## 2014-08-24 NOTE — Assessment & Plan Note (Addendum)
Assessment: Patient has a history of chronic hyperkalemia treated several times in the past with kayexalate without resolution of condition. Further testing resulted in the following: Aldosterone level <1 PRA 0.19 Aldo/PRA ratio was unable to be calculated because aldosterone levels was so low Urine potassium level 31  Urine osm 584 TTKG was 3  These results are indicative of hyporeninemic hypoaldosteronism which would explain his chronic hyperkalemia. These results were explained to the patient. Patient was counseled on the importance of inpatient treatment for his hyperkalemia of 6.4 and EKG changes with peaked T waves.   Plan: Patient agreed to be admitted to the hospital for treatment. Once the patient is stable, we recommend initiating HCTZ 25 mg daily. Once his blood pressure is controlled, we recommend starting fludrocortisone 0.2 mg to 1 mg daily for treatment of hyporeninemic hypoaldosteronism. He has already been adhering to a low potassium diet. Patient cannot afford kayexalate medication.

## 2014-08-25 ENCOUNTER — Encounter (HOSPITAL_COMMUNITY): Payer: Self-pay | Admitting: General Practice

## 2014-08-25 ENCOUNTER — Observation Stay (HOSPITAL_COMMUNITY): Payer: Medicare Other

## 2014-08-25 DIAGNOSIS — I10 Essential (primary) hypertension: Secondary | ICD-10-CM | POA: Diagnosis not present

## 2014-08-25 DIAGNOSIS — I209 Angina pectoris, unspecified: Secondary | ICD-10-CM | POA: Diagnosis not present

## 2014-08-25 DIAGNOSIS — N281 Cyst of kidney, acquired: Secondary | ICD-10-CM | POA: Diagnosis not present

## 2014-08-25 DIAGNOSIS — N183 Chronic kidney disease, stage 3 unspecified: Secondary | ICD-10-CM | POA: Diagnosis not present

## 2014-08-25 DIAGNOSIS — E2749 Other adrenocortical insufficiency: Secondary | ICD-10-CM | POA: Diagnosis not present

## 2014-08-25 DIAGNOSIS — N19 Unspecified kidney failure: Secondary | ICD-10-CM | POA: Diagnosis not present

## 2014-08-25 DIAGNOSIS — E875 Hyperkalemia: Secondary | ICD-10-CM | POA: Diagnosis not present

## 2014-08-25 DIAGNOSIS — F17200 Nicotine dependence, unspecified, uncomplicated: Secondary | ICD-10-CM | POA: Diagnosis not present

## 2014-08-25 DIAGNOSIS — N1330 Unspecified hydronephrosis: Secondary | ICD-10-CM | POA: Diagnosis not present

## 2014-08-25 DIAGNOSIS — I129 Hypertensive chronic kidney disease with stage 1 through stage 4 chronic kidney disease, or unspecified chronic kidney disease: Secondary | ICD-10-CM | POA: Diagnosis not present

## 2014-08-25 LAB — CBC WITH DIFFERENTIAL/PLATELET
Basophils Absolute: 0.1 10*3/uL (ref 0.0–0.1)
Basophils Relative: 1 % (ref 0–1)
Eosinophils Absolute: 0.4 10*3/uL (ref 0.0–0.7)
Eosinophils Relative: 8 % — ABNORMAL HIGH (ref 0–5)
HCT: 31.5 % — ABNORMAL LOW (ref 39.0–52.0)
Hemoglobin: 10.1 g/dL — ABNORMAL LOW (ref 13.0–17.0)
LYMPHS ABS: 1 10*3/uL (ref 0.7–4.0)
LYMPHS PCT: 20 % (ref 12–46)
MCH: 32 pg (ref 26.0–34.0)
MCHC: 32.1 g/dL (ref 30.0–36.0)
MCV: 99.7 fL (ref 78.0–100.0)
MONO ABS: 0.5 10*3/uL (ref 0.1–1.0)
Monocytes Relative: 10 % (ref 3–12)
Neutro Abs: 3 10*3/uL (ref 1.7–7.7)
Neutrophils Relative %: 61 % (ref 43–77)
Platelets: 259 10*3/uL (ref 150–400)
RBC: 3.16 MIL/uL — AB (ref 4.22–5.81)
RDW: 13.6 % (ref 11.5–15.5)
WBC: 4.9 10*3/uL (ref 4.0–10.5)

## 2014-08-25 LAB — BASIC METABOLIC PANEL
Anion gap: 12 (ref 5–15)
BUN: 27 mg/dL — AB (ref 6–23)
CALCIUM: 8.7 mg/dL (ref 8.4–10.5)
CO2: 21 mEq/L (ref 19–32)
Chloride: 106 mEq/L (ref 96–112)
Creatinine, Ser: 1.45 mg/dL — ABNORMAL HIGH (ref 0.50–1.35)
GFR calc Af Amer: 53 mL/min — ABNORMAL LOW (ref >90)
GFR, EST NON AFRICAN AMERICAN: 46 mL/min — AB (ref >90)
Glucose, Bld: 104 mg/dL — ABNORMAL HIGH (ref 70–99)
POTASSIUM: 4.7 meq/L (ref 3.7–5.3)
SODIUM: 139 meq/L (ref 137–147)

## 2014-08-25 MED ORDER — NICOTINE 21 MG/24HR TD PT24
21.0000 mg | MEDICATED_PATCH | Freq: Every day | TRANSDERMAL | Status: DC
Start: 2014-08-25 — End: 2014-08-26
  Administered 2014-08-25 – 2014-08-26 (×2): 21 mg via TRANSDERMAL
  Filled 2014-08-25 (×2): qty 1

## 2014-08-25 MED ORDER — INFLUENZA VAC SPLIT QUAD 0.5 ML IM SUSY
0.5000 mL | PREFILLED_SYRINGE | INTRAMUSCULAR | Status: AC
  Administered 2014-08-25: 0.5 mL via INTRAMUSCULAR
  Filled 2014-08-25: qty 0.5

## 2014-08-25 MED ORDER — FLUDROCORTISONE ACETATE 0.1 MG PO TABS
0.0500 mg | ORAL_TABLET | Freq: Every day | ORAL | Status: DC
  Administered 2014-08-25 – 2014-08-26 (×2): 0.05 mg via ORAL
  Filled 2014-08-25 (×2): qty 0.5

## 2014-08-25 NOTE — H&P (Signed)
Agree with MS 4 note see my note for more details   Aundra Dubin MD

## 2014-08-25 NOTE — Progress Notes (Signed)
Utilization review completed.  

## 2014-08-25 NOTE — Progress Notes (Signed)
Post Void Residual=51cc

## 2014-08-25 NOTE — H&P (Signed)
Internal Medicine Attending Admission Note Date: 08/25/2014  Patient name: Derrick Hendrix Medical record number: [id number] Date of birth: 04-03-1941 Age: 73 y.o. Gender: male  I saw and evaluated the patient. I reviewed the resident's note and I agree with the resident's findings and plan as documented in the resident's note, with the following additional comments.  Chief Complaint(s): Hyperkalemia  History - key components related to admission: Patient is a 73 year old man with history of hypertension, chronic kidney disease, peripheral vascular disease, coronary artery disease, suspected BPH, tobacco abuse, and recurrent hyperkalemia admitted with elevated potassium of 6.4 noted in the outpatient clinic.  Patient has no acute complaints.   Physical Exam - key components related to admission:  Filed Vitals:   08/24/14 1529 08/24/14 2006 08/25/14 0619 08/25/14 1007  BP: 181/76 138/60 145/68 138/72  Pulse: 70 72 77 72  Temp: 98.1 F (36.7 C) 98 F (36.7 C) 98.2 F (36.8 C)   TempSrc: Oral Oral Oral   Resp: 16 16    SpO2: 98% 100% 100%     General: Alert, no distress Lungs: Clear Heart: Regular; S1-S2, no S3, no S4, no murmurs Abdomen: Bowel sounds present, soft, nontender Extremities: No edema  Lab results:   Basic Metabolic Panel:  Recent Labs  08/24/14 0929 08/24/14 1600  NA 142 140  K 6.4* 5.0  CL 109 107  CO2 21 17*  GLUCOSE 78 225*  BUN 23 23  CREATININE 1.38* 1.22  CALCIUM 9.7 8.8    Liver Function Tests:  Recent Labs  08/24/14 1600  AST 28  ALT 43  ALKPHOS 128*  BILITOT 0.2*  PROT 7.4  ALBUMIN 3.8    CBC:  Recent Labs  08/24/14 1600  WBC 6.1  HGB 10.5*  HCT 32.1*  MCV 99.7  PLT 256    Recent Labs  08/24/14 1600  NEUTROABS 3.9  LYMPHSABS 1.2  MONOABS 0.4  EOSABS 0.4  BASOSABS 0.0    Cardiac Enzymes:  Recent Labs  08/24/14 1600  TROPONINI <0.30    Other results: EKG 9/10: Normal sinus rhythm; moderate voltage criteria  for LVH, may be normal variant; early repolarization; borderline ECG EKG 9/11:  Sinus rhythm with sinus arrhythmia with occasional premature ventricular complexes; early repolarization   Assessment & Plan by Problem:  1.  Hyperkalemia.  Recent outpatient measurement of plasma renin activity and aldosterone levels suggest hyporeninemic hypoaldosteronism as the underlying cause.  Would check a serum cortisol as well.  Potassium has improved following initial treatment with Kayexalate.  Plans include hydrochlorothiazide; low potassium diet; treatment with fludrocortisone would be appropriate if his blood pressure can be controlled, and this has been a problem in the past partially due to medication nonadherence related to difficulty affording his medications.  2.  BPH.  Patient was prescribed Flomax on 08/01/2014 due to symptoms consistent with BPH and an elevated postvoid residual noted in the clinic of 182 mL.  He apparently had not started Flomax as an outpatient, but was started on terazosin at bedtime this admission and received his first dose last night.  He reports improvement in his symptoms since he has been in the hospital on the terazosin, and his postvoid residual today is improved compared to prior measurement.  As previously noted, it is possible that he has some element of obstructive uropathy due to BPH which may be contributing to problem #1.  Plans include continue terazosin; renal ultrasound to rule out hydronephrosis.  3.  Hypertension.  Blood pressure much improved today;  plan is to follow BP and adjust regimen as indicated.  4.  Tobacco.  Plan is counsel regarding the importance of smoking cessation; use nicotine patch to assess.  5.  Other problems and plans as per the resident physician's note.

## 2014-08-25 NOTE — Progress Notes (Signed)
Subjective: No acute events overnight. Patient reports feeling "normal."  Denies chest pain, palpitations, dyspnea.  Patient had 3 BMs overnight, now with a total of 5 since admission.  Normal appetite.  Objective: Vital signs in last 24 hours: Filed Vitals:   08/24/14 1529 08/24/14 2006 08/25/14 0619 08/25/14 1007  BP: 181/76 138/60 145/68 138/72  Pulse: 70 72 77 72  Temp: 98.1 F (36.7 C) 98 F (36.7 C) 98.2 F (36.8 C)   TempSrc: Oral Oral Oral   Resp: 16 16    SpO2: 98% 100% 100%    Weight change:   Intake/Output Summary (Last 24 hours) at 08/25/14 1015 Last data filed at 08/24/14 2201  Gross per 24 hour  Intake    103 ml  Output      0 ml  Net    103 ml   General: Responsive and in NAD HEENT: MMM. PERLLA. No lymphedema or masses CV: RRR, no murmurs, rubs, or gallops Pulm: CTA bilaterally, no wheezes, crackles, or ronchi Abd:  NBS in 4Q.  Non-tender to deep and light palpation Ext: No edema or swelling.  Pulses 2+ bilaterally Skin: dry, no suspicious rashes or lesions Neuro: A and A x3. CN grossly intact   Medications: I have reviewed the patient's current medications. Scheduled Meds: . amLODipine  5 mg Oral Daily  . atorvastatin  80 mg Oral q1800  . carvedilol  6.25 mg Oral BID WC  . cilostazol  100 mg Oral BID  . enoxaparin (LOVENOX) injection  40 mg Subcutaneous Q24H  . hydrochlorothiazide  25 mg Oral Daily  . Influenza vac split quadrivalent PF  0.5 mL Intramuscular Tomorrow-1000  . nicotine  21 mg Transdermal Daily  . sodium chloride  3 mL Intravenous Q12H  . terazosin  1 mg Oral QHS  . vitamin B-12  1,000 mcg Oral Daily   Continuous Infusions:  PRN Meds:.acetaminophen, nitroGLYCERIN  Assessment: Derrick Hendrix is a 73 yo male with a PMH of HTN, stable angina, CKD stage III, tobacco abuse, and hyperkalemia who presented as an admission from clinic with hyperkalemia with EKG changes. Stable  Assessment: Principal Problem:   Hyperkalemia Active  Problems:   Headache   Essential hypertension, benign   Current smoker   Atherosclerosis of leg with intermittent claudication, R   Hyporeninemic hypoaldosteronism  #Hyperkalemia 2/2 hyporeninemic hypoaldosteronism: Patient's chronic hyperkalemia is most likely due to hyporeninemic hypoaldosteronism.  In addition to CKD, this may also be due to obstructive uropathy, so plan to evaluate before discharge.  Normal EKG this morning.  Plan to discharge tomorrow if stable overnight on florinef. - CTM telemetry  - K+ 4.7 this morning - Will initiate florinef .05 mg po qd and monitor blood pressure overnight - HCTZ 25 mg po qd - f/u PVR and renal US; consult urology if necessary  #Essential Hypertension:  - norvasc 5,g po qd  - coreg 6.35 mg po qd  - HCTZ 25 mg po qd   #BPH - terazosin 1mg  po qhs   #HLD:  - atorvastatin 80mg  po qd   #PVD:  -cilostazol 100mg  po bid   #Angina:  Nitroglycerin .4 mg sublingual every 5 minutes x3 PRN   #B12 deficiency:  Vitamin B12 1057mcg po qd   PPX: enoxaparin 40mg  sq qd  This is a Careers information officer Note.  The care of the patient was discussed with Dr. Algis Liming and the assessment and plan formulated with their assistance.  Please see their attached note for official documentation of the  daily encounter.   LOS: 1 day   Charise Killian, Med Student 08/25/2014, 10:15 AM

## 2014-08-25 NOTE — Progress Notes (Signed)
Subjective: NAEON. Pt feeling well this AM. No complaints.   Objective: Vital signs in last 24 hours: Filed Vitals:   08/24/14 1529 08/24/14 2006 08/25/14 0619  BP: 181/76 138/60 145/68  Pulse: 70 72 77  Temp: 98.1 F (36.7 C) 98 F (36.7 C) 98.2 F (36.8 C)  TempSrc: Oral Oral Oral  Resp: 16 16   SpO2: 98% 100% 100%   Weight change:   Intake/Output Summary (Last 24 hours) at 08/25/14 0924 Last data filed at 08/24/14 2201  Gross per 24 hour  Intake    103 ml  Output      0 ml  Net    103 ml   General: sitting in chair, NAD HEENT: PERRL, EOMI, no scleral icterus Cardiac: RRR, no rubs, murmurs or gallops Pulm: clear to auscultation bilaterally, moving normal volumes of air Abd: soft, nontender, nondistended, BS present Ext: warm and well perfused, no pedal edema Neuro: alert and oriented X3, cranial nerves II-XII grossly intact  Lab Results: Basic Metabolic Panel:  Recent Labs Lab 08/24/14 0929 08/24/14 1600  NA 142 140  K 6.4* 5.0  CL 109 107  CO2 21 17*  GLUCOSE 78 225*  BUN 23 23  CREATININE 1.38* 1.22  CALCIUM 9.7 8.8   Liver Function Tests:  Recent Labs Lab 08/24/14 1600  AST 28  ALT 43  ALKPHOS 128*  BILITOT 0.2*  PROT 7.4  ALBUMIN 3.8   CBC:  Recent Labs Lab 08/24/14 1600  WBC 6.1  NEUTROABS 3.9  HGB 10.5*  HCT 32.1*  MCV 99.7  PLT 256   Cardiac Enzymes:  Recent Labs Lab 08/24/14 1600  TROPONINI <0.30   Urine Drug Screen: Drugs of Abuse     Component Value Date/Time   LABOPIA NONE DETECTED 06/26/2014 1552   COCAINSCRNUR NONE DETECTED 06/26/2014 1552   LABBENZ NONE DETECTED 06/26/2014 1552   AMPHETMU NONE DETECTED 06/26/2014 1552   THCU NONE DETECTED 06/26/2014 1552   LABBARB NONE DETECTED 06/26/2014 1552   Medications: I have reviewed the patient's current medications. Scheduled Meds: . amLODipine  5 mg Oral Daily  . atorvastatin  80 mg Oral q1800  . carvedilol  6.25 mg Oral BID WC  . cilostazol  100 mg Oral BID  .  enoxaparin (LOVENOX) injection  40 mg Subcutaneous Q24H  . hydrochlorothiazide  25 mg Oral Daily  . Influenza vac split quadrivalent PF  0.5 mL Intramuscular Tomorrow-1000  . nicotine  21 mg Transdermal Daily  . sodium chloride  3 mL Intravenous Q12H  . terazosin  1 mg Oral QHS  . vitamin B-12  1,000 mcg Oral Daily   Continuous Infusions:  PRN Meds:.acetaminophen, nitroGLYCERIN Assessment/Plan:  #Hyperkalemia 2/2 hyporeninemic hypoaldosteronism: admission K 6.4>> 5.0 after kayexelate and insulin therapies. Serial EKGs have not shown any new changes and is stable. Other possible etiology includes obstructive disease as pt has had documented PRV of >500cc previously and this maybe contributing to continued hyperkalemia.  -measure PRV may need to touch base with urology again if this number is profound and consider foley therapy and f/u with their office -renal US to r/o obstructive pathology vs obstructive sequale on renal structure such as hydronephrosis/renal pelvis dilations -Consider fludrocortisone 0.2 mg to 1 mg daily for treatment of hyporeninemic hypoaldosteronism though currently BP uncontrolled so caution but due to elevated BP he will be continued HCTZ   # Essential hypertension, uncontrolled: SBP this AM in 140/80s compared to admission with SBP in 180s.  -cont home meds  of Norvasc 5 mg qd may need to increase to 10mg  qd if continues to have elevated readings.Coreg 6.25 mg bid, HCTZ 25 mg qd, Hytrin 1 mg qhs  -monitor BP  Dispo: Disposition is deferred at this time, awaiting improvement of current medical problems.  Anticipated discharge in approximately 2-3 day(s).   The patient does have a current PCP Luan Moore, MD) and does need an [facility] hospital follow-up appointment after discharge.  The patient does have transportation limitations that hinder transportation to clinic appointments.  .Services Needed at time of discharge: Y = Yes, Blank = No PT:   OT:   RN:   Equipment:    Other:     LOS: 1 day   Clinton Gallant, MD 08/25/2014, 9:24 AM

## 2014-08-25 NOTE — Care Management Note (Unsigned)
    Page 1 of 1   08/25/2014     11:27:30 AM CARE MANAGEMENT NOTE 08/25/2014  Patient:  Derrick Hendrix, Derrick Hendrix   Account Number:  [id number]  Date Initiated:  08/25/2014  Documentation initiated by:  GRAVES-BIGELOW,Shenaya Lebo  Subjective/Objective Assessment:   Hyperkalemia with peaked T waves on EKG.     Action/Plan:   CM received referral for medication assistance. CM unable to assist with medications due to pt has insurance. CM did discuss with pt that [facility] has a $4.00 med list. He relayed transportation issues. CM did consult CSW for resources.   Anticipated DC Date:  08/26/2014   Anticipated DC Plan:  [place]  CM consult      Choice offered to / List presented to:             Status of service:  Completed, signed off Medicare Important Message given?   (If response is "NO", the following Medicare IM given date fields will be blank) Date Medicare IM given:   Medicare IM given by:   Date Additional Medicare IM given:   Additional Medicare IM given by:    Discharge Disposition:    Per UR Regulation:  Reviewed for med. necessity/level of care/duration of stay  If discussed at [place] of Stay Meetings, dates discussed:    Comments:

## 2014-08-25 NOTE — Progress Notes (Signed)
   I have seen the patient and reviewed the daily progress note by Charise Killian MS 4 and discussed the care of the patient with them.  See below my documentation of my findings, assessment, and plans.  Clinton Gallant, MD PGY-3

## 2014-08-26 DIAGNOSIS — I209 Angina pectoris, unspecified: Secondary | ICD-10-CM | POA: Diagnosis not present

## 2014-08-26 DIAGNOSIS — N183 Chronic kidney disease, stage 3 unspecified: Secondary | ICD-10-CM | POA: Diagnosis not present

## 2014-08-26 DIAGNOSIS — E875 Hyperkalemia: Secondary | ICD-10-CM | POA: Diagnosis not present

## 2014-08-26 DIAGNOSIS — E2749 Other adrenocortical insufficiency: Secondary | ICD-10-CM | POA: Diagnosis not present

## 2014-08-26 DIAGNOSIS — F17200 Nicotine dependence, unspecified, uncomplicated: Secondary | ICD-10-CM | POA: Diagnosis not present

## 2014-08-26 DIAGNOSIS — I129 Hypertensive chronic kidney disease with stage 1 through stage 4 chronic kidney disease, or unspecified chronic kidney disease: Secondary | ICD-10-CM | POA: Diagnosis not present

## 2014-08-26 LAB — BASIC METABOLIC PANEL
Anion gap: 12 (ref 5–15)
BUN: 27 mg/dL — AB (ref 6–23)
CO2: 20 mEq/L (ref 19–32)
Calcium: 8.6 mg/dL (ref 8.4–10.5)
Chloride: 107 mEq/L (ref 96–112)
Creatinine, Ser: 1.42 mg/dL — ABNORMAL HIGH (ref 0.50–1.35)
GFR calc Af Amer: 54 mL/min — ABNORMAL LOW (ref >90)
GFR, EST NON AFRICAN AMERICAN: 47 mL/min — AB (ref >90)
Glucose, Bld: 106 mg/dL — ABNORMAL HIGH (ref 70–99)
Potassium: 4.9 mEq/L (ref 3.7–5.3)
Sodium: 139 mEq/L (ref 137–147)

## 2014-08-26 LAB — CORTISOL-AM, BLOOD: Cortisol - AM: 6.7 ug/dL (ref 4.3–22.4)

## 2014-08-26 LAB — FOLATE: FOLATE: 11.5 ng/mL

## 2014-08-26 MED ORDER — FLUDROCORTISONE ACETATE 0.1 MG PO TABS: 0.0500 mg | ORAL_TABLET | Freq: Every day | ORAL | Status: DC

## 2014-08-26 NOTE — Progress Notes (Addendum)
Subjective: Pt denies sob, chest pain, he is peeing ok. PVR 51 noted by RN.  He states is is sleepy during the day and stays up watching TV at night. He c/o mild low back pain this am.  He reports episode last night where left foot was burning this has occurred on and off for a while and a cool surface helps ease the burning and also using lotion.    Objective: Vital signs in last 24 hours: Filed Vitals:   08/26/14 0500 08/26/14 0804 08/26/14 0925 08/26/14 0955  BP: 105/54 116/64 118/54 128/52  Pulse: 78 83 79   Temp: 98.8 F (37.1 C) 98.4 F (36.9 C)    TempSrc: Oral Oral    Resp: 18     Height:      Weight:      SpO2: 99% 99%     Weight change:   Intake/Output Summary (Last 24 hours) at 08/26/14 1055 Last data filed at 08/26/14 1054  Gross per 24 hour  Intake    480 ml  Output      0 ml  Net    480 ml   Vitals reviewed. General: resting in bed, NAD HEENT: Fontanet/at, no scleral icterus Cardiac: RRR, no rubs, murmurs or gallops Pulm: clear to auscultation bilaterally, no wheezes, rales, or rhonchi Abd: soft, nontender, nondistended, BS present Ext: warm and well perfused, no pedal edema MSK: mild low back ttp Neuro: alert and oriented X3, cranial nerves II-XII grossly intact Skin: peeling and cracking to b/l feet and interdigital web  Lab Results: Basic Metabolic Panel:  Recent Labs Lab 08/25/14 1045 08/26/14 0204  NA 139 139  K 4.7 4.9  CL 106 107  CO2 21 20  GLUCOSE 104* 106*  BUN 27* 27*  CREATININE 1.45* 1.42*  CALCIUM 8.7 8.6   Liver Function Tests:  Recent Labs Lab 08/24/14 1600  AST 28  ALT 43  ALKPHOS 128*  BILITOT 0.2*  PROT 7.4  ALBUMIN 3.8   CBC:  Recent Labs Lab 08/24/14 1600 08/25/14 1045  WBC 6.1 4.9  NEUTROABS 3.9 3.0  HGB 10.5* 10.1*  HCT 32.1* 31.5*  MCV 99.7 99.7  PLT 256 259   Cardiac Enzymes:  Recent Labs Lab 08/24/14 1600  TROPONINI <0.30   Misc. Labs: Folate  Studies/Results: US Renal  08/25/2014    CLINICAL DATA:  Evaluate for obstruction. Hydronephrosis. Elevated creatinine in and BUN. Renal failure.  EXAM: RENAL/URINARY TRACT ULTRASOUND COMPLETE  COMPARISON:  None.  FINDINGS: Right Kidney:  Length: 12.0 cm. Echogenic cortex compatible with medical renal disease. No hydronephrosis. No calculi. Renal cysts are present with the largest measuring 28 mm x 27 mm x 32 mm doing the inferior interpolar region.  Left Kidney:  Length: 10.7 cm. Echogenic cortex compatible with medical renal disease. Cysts are present, with the largest in the interpolar region measuring 36 mm x 36 mm x 36 mm. No calculi. No hydronephrosis.  Bladder:  Appears normal for degree of bladder distention.  IMPRESSION: 1. Echogenic renal cortex compatible with medical renal disease. 2. Bilateral renal cysts.   Electronically Signed   By: Dereck Ligas M.D.   On: 08/25/2014 19:29   Medications:  Scheduled Meds: . amLODipine  5 mg Oral Daily  . atorvastatin  80 mg Oral q1800  . carvedilol  6.25 mg Oral BID WC  . cilostazol  100 mg Oral BID  . enoxaparin (LOVENOX) injection  40 mg Subcutaneous Q24H  . fludrocortisone  0.05 mg Oral  Daily  . hydrochlorothiazide  25 mg Oral Daily  . nicotine  21 mg Transdermal Daily  . sodium chloride  3 mL Intravenous Q12H  . terazosin  1 mg Oral QHS  . vitamin B-12  1,000 mcg Oral Daily   Continuous Infusions:  PRN Meds:.acetaminophen, nitroGLYCERIN Assessment/Plan:  73 y.o w/ hyperkalemia with peaked T waves on EKG.   #Hyperkalemia, resolved -Likely secondary to hyporeninemic hypoaldosteronism. Cortisol at 3:10 yesterday was 6.7. consider repeating am as values fluctuate throughout the day.  cortisol outpatient to r/u adrenal insuffiency normal levels in am are 15-18 that ensure no adrenal insuffienciency though levels  -US renal with medical renal disease and b/l renal cysts  -pt was not taking Chantix but rec avoid in future w/ side effect of hyperK  -Continue fludrocortisone 0.05 mg  daily and continued HCTZ 25mg  -discussed low K diet and given list of low and high K foods   #lower extremity burning  -this has been chronic and intermittent could be related to PAD, will check folate level, B12 and TSH wnl 06/2014. Also could be tinea pedis  -pending folate  -Advised pt to try OTC fungal creams  #Low back pain  -prn Tylenol  #Essential hypertension -controlled this am 116/64   -Resumed home BP medications Norvasc 5 mg qd, Coreg 6.25 mg bid, HCTZ 25 mg qd (recently increased dose in clinic), Hytrin 1 mg qhs (recently added) in clinic  -monitor BP    #Eosinophilia  -Appears chronic, unclear etiology   #Atherosclerosis of leg with intermittent claudication  -resumed Cilostazol  -encouraged smoking cessation.   #HLD  -Continued Lipitor 80 mg qhs home dose   #Current smoker  -smoking cessation   #F/E/N  -NSL  -renal diet   #DVT px  -Lovenox, scds  Dispo: D/c later today   The patient does have a current PCP Luan Moore, MD) and does need an [facility] hospital follow-up appointment after discharge.  The patient does not have transportation limitations that hinder transportation to clinic appointments.  .Services Needed at time of discharge: Y = Yes, Blank = No PT:   OT:   RN:   Equipment:   Other:     LOS: 2 days   Cresenciano Genre, MD [phone] 08/26/2014, 10:55 AM

## 2014-08-26 NOTE — Discharge Instructions (Signed)
Hyperkalemia Hyperkalemia is when you have too much potassium in your blood. This can be a life-threatening condition. Potassium is normally removed (excreted) from the body by the kidneys. CAUSES  The potassium level in your body can become too high for the following reasons:  You take in too much potassium. You can do this by:  Using salt substitutes. They contain large amounts of potassium.  Taking potassium supplements from your caregiver. The dose may be too high for you.  Eating foods or taking nutritional products with potassium.  You excrete too little potassium. This can happen if:  Your kidneys are not functioning properly. Kidney (renal) disease is a very common cause of hyperkalemia.  You are taking medicines that lower your excretion of potassium, such as certain diuretic medicines.  You have an adrenal gland disease called Addison's disease.  You have a urinary tract obstruction, such as kidney stones.  You are on treatment to mechanically clean your blood (dialysis) and you skip a treatment.  You release a high amount of potassium from your cells into your blood. You may have a condition that causes potassium to move from your cells to your bloodstream. This can happen with:  Injury to muscles or other tissues. Most potassium is stored in the muscles.  Severe burns or infections.  Acidic blood plasma (acidosis). Acidosis can result from many diseases, such as uncontrolled diabetes. SYMPTOMS  Usually, there are no symptoms unless the potassium is dangerously high or has risen very quickly. Symptoms may include:  Irregular or very slow heartbeat.  Feeling sick to your stomach (nauseous).  Tiredness (fatigue).  Nerve problems such as tingling of the skin, numbness of the hands or feet, weakness, or paralysis. DIAGNOSIS  A simple blood test can measure the amount of potassium in your body. An electrocardiogram test of the heart can also help make the diagnosis.  The heart may beat dangerously fast or slow down and stop beating with severe hyperkalemia.  TREATMENT  Treatment depends on how bad the condition is and on the underlying cause.  If the hyperkalemia is an emergency (causing heart problems or paralysis), many different medicines can be used alone or together to lower the potassium level briefly. This may include an insulin injection even if you are not diabetic. Emergency dialysis may be needed to remove potassium from the body.  If the hyperkalemia is less severe or dangerous, the underlying cause is treated. This can include taking medicines if needed. Your prescription medicines may be changed. You may also need to take a medicine to help your body get rid of potassium. You may need to eat a diet low in potassium. HOME CARE INSTRUCTIONS   Take medicines and supplements as directed by your caregiver.  Do not take any over-the-counter medicines, supplements, natural products, herbs, or vitamins without reviewing them with your caregiver. Certain supplements and natural food products can have high amounts of potassium. Other products (such as ibuprofen) can damage weak kidneys and raise your potassium.  You may be asked to do repeat lab tests. Be sure to follow these directions.  If you have kidney disease, you may need to follow a low potassium diet. SEEK MEDICAL CARE IF:   You notice an irregular or very slow heartbeat.  You feel lightheaded.  You develop weakness that is unusual for you. SEEK IMMEDIATE MEDICAL CARE IF:   You have shortness of breath.  You have chest discomfort.  You pass out (faint). MAKE SURE YOU:   Understand  these instructions.  Will watch your condition.  Will get help right away if you are not doing well or get worse. Document Released: 11/21/2002 Document Revised: 02/23/2012 Document Reviewed: 03/08/2014 [facility] Patient Information 2015 [place], [place]. This information is not intended to replace  advice given to you by your health care provider. Make sure you discuss any questions you have with your health care provider.  Hypertension Hypertension, commonly called high blood pressure, is when the force of blood pumping through your arteries is too strong. Your arteries are the blood vessels that carry blood from your heart throughout your body. A blood pressure reading consists of a higher number over a lower number, such as 110/72. The higher number (systolic) is the pressure inside your arteries when your heart pumps. The lower number (diastolic) is the pressure inside your arteries when your heart relaxes. Ideally you want your blood pressure below 120/80. Hypertension forces your heart to work harder to pump blood. Your arteries may become narrow or stiff. Having hypertension puts you at risk for heart disease, stroke, and other problems.  RISK FACTORS Some risk factors for high blood pressure are controllable. Others are not.  Risk factors you cannot control include:   Race. You may be at higher risk if you are African American.  Age. Risk increases with age.  Gender. Men are at higher risk than women before age 41 years. After age 1, women are at higher risk than men. Risk factors you can control include:  Not getting enough exercise or physical activity.  Being overweight.  Getting too much fat, sugar, calories, or salt in your diet.  Drinking too much alcohol. SIGNS AND SYMPTOMS Hypertension does not usually cause signs or symptoms. Extremely high blood pressure (hypertensive crisis) may cause headache, anxiety, shortness of breath, and nosebleed. DIAGNOSIS  To check if you have hypertension, your health care provider will measure your blood pressure while you are seated, with your arm held at the level of your heart. It should be measured at least twice using the same arm. Certain conditions can cause a difference in blood pressure between your right and left arms. A blood  pressure reading that is higher than normal on one occasion does not mean that you need treatment. If one blood pressure reading is high, ask your health care provider about having it checked again. TREATMENT  Treating high blood pressure includes making lifestyle changes and possibly taking medicine. Living a healthy lifestyle can help lower high blood pressure. You may need to change some of your habits. Lifestyle changes may include:  Following the DASH diet. This diet is high in fruits, vegetables, and whole grains. It is low in salt, red meat, and added sugars.  Getting at least 2 hours of brisk physical activity every week.  Losing weight if necessary.  Not smoking.  Limiting alcoholic beverages.  Learning ways to reduce stress. If lifestyle changes are not enough to get your blood pressure under control, your health care provider may prescribe medicine. You may need to take more than one. Work closely with your health care provider to understand the risks and benefits. HOME CARE INSTRUCTIONS  Have your blood pressure rechecked as directed by your health care provider.   Take medicines only as directed by your health care provider. Follow the directions carefully. Blood pressure medicines must be taken as prescribed. The medicine does not work as well when you skip doses. Skipping doses also puts you at risk for problems.   Do  not smoke.   Monitor your blood pressure at home as directed by your health care provider. SEEK MEDICAL CARE IF:   You think you are having a reaction to medicines taken.  You have recurrent headaches or feel dizzy.  You have swelling in your ankles.  You have trouble with your vision. SEEK IMMEDIATE MEDICAL CARE IF:  You develop a severe headache or confusion.  You have unusual weakness, numbness, or feel faint.  You have severe chest or abdominal pain.  You vomit repeatedly.  You have trouble breathing. MAKE SURE YOU:   Understand  these instructions.  Will watch your condition.  Will get help right away if you are not doing well or get worse. Document Released: 12/01/2005 Document Revised: 04/17/2014 Document Reviewed: 09/23/2013 [facility] Patient Information 2015 [place], [place]. This information is not intended to replace advice given to you by your health care provider. Make sure you discuss any questions you have with your health care provider.

## 2014-08-27 NOTE — Discharge Summary (Signed)
Name: Derrick Hendrix MRN: [id number] DOB: 05/15/1941 73 y.o. PCP: Luan Moore, MD  Date of Admission: 08/24/2014  3:07 PM Date of Discharge: 08/26/2014 Attending Physician: Dr. Marinda Elk   Discharge Diagnosis: 1. Hyperkalemia, resolved  (6.4>5.0>4.7>4.9) 2. Lower extremity burning-left foot  3. Low back pain  4. Essential hypertension  5. Eosinophilia  6. Atherosclerosis of leg with intermittent claudication  7. Dyslipidemia 8. Tobacco abuse  Discharge Medications:   Medication List    STOP taking these medications       varenicline 0.5 MG tablet  Commonly known as:  CHANTIX      TAKE these medications       amLODipine 5 MG tablet  Commonly known as:  NORVASC  Take 1 tablet (5 mg total) by mouth daily.     atorvastatin 80 MG tablet  Commonly known as:  LIPITOR  Take 1 tablet (80 mg total) by mouth daily.     carvedilol 6.25 MG tablet  Commonly known as:  COREG  Take 1 tablet (6.25 mg total) by mouth 2 (two) times daily.     cilostazol 100 MG tablet  Commonly known as:  PLETAL  Take 1 tablet (100 mg total) by mouth 2 (two) times daily.     fludrocortisone 0.1 MG tablet  Commonly known as:  FLORINEF  Take 0.5 tablets (0.05 mg total) by mouth daily.     hydrochlorothiazide 12.5 MG tablet  Commonly known as:  HYDRODIURIL  Take 25 mg by mouth 2 (two) times daily.     nitroGLYCERIN 0.4 MG SL tablet  Commonly known as:  NITROSTAT  Place 1 tablet (0.4 mg total) under the tongue every 5 (five) minutes x 3 doses as needed for chest pain.     terazosin 1 MG capsule  Commonly known as:  HYTRIN  Take 1 capsule (1 mg total) by mouth at bedtime.     vitamin B-12 1000 MCG tablet  Commonly known as:  CYANOCOBALAMIN  Take 1 tablet (1,000 mcg total) by mouth daily.        Disposition and follow-up:   Derrick Hendrix was discharged from [facility] in stable condition.  At the hospital follow up visit please address:  1.   -check BMET (for K), CBC  for eosinophilia  -Avoid Chantix can cause hyperkalemia -Check BP on Florinef  -ask about compliance with medications -Consider 8 am Cortisol if concerned for adrenal insuffiency  2.  Labs / imaging needed at time of follow-up:  -see above    3.  Pending labs/ test needing follow-up:  -none   Follow-up Appointments: Follow-up Information   Follow up with Luan Moore, MD On 08/31/2014. (Re-check K+ 8:45 am)    Specialty:  Internal Medicine   Contact information:   [address] [place] [address] [phone]       Discharge Instructions: Discharge Instructions   Discharge instructions    Complete by:  As directed   Please follow up in Internal Medicine Clinic in 1 week Take care Pick up medications from your pharmacy today Eat a low potassium diet     Increase activity slowly    Complete by:  As directed            Consultations: none Procedures Performed:  US Renal  08/25/2014   CLINICAL DATA:  Evaluate for obstruction. Hydronephrosis. Elevated creatinine in and BUN. Renal failure.  EXAM: RENAL/URINARY TRACT ULTRASOUND COMPLETE  COMPARISON:  None.  FINDINGS: Right Kidney:  Length: 12.0  cm. Echogenic cortex compatible with medical renal disease. No hydronephrosis. No calculi. Renal cysts are present with the largest measuring 28 mm x 27 mm x 32 mm doing the inferior interpolar region.  Left Kidney:  Length: 10.7 cm. Echogenic cortex compatible with medical renal disease. Cysts are present, with the largest in the interpolar region measuring 36 mm x 36 mm x 36 mm. No calculi. No hydronephrosis.  Bladder:  Appears normal for degree of bladder distention.  IMPRESSION: 1. Echogenic renal cortex compatible with medical renal disease. 2. Bilateral renal cysts.   Electronically Signed   By: Dereck Ligas M.D.   On: 08/25/2014 19:29      Admission HPI:   Chief Complaint: Hyperkalemia with peaked T waves on EKG  History of Present Illness:  73 y.o PMH HTN, headaches,  tobacco abuse, PAD, CKD3, stable angina, suspected BPH. He presented to the [facility] to follow up on hyperkalemia noted since 07/28/2014 K has been fluctuating 6.0 on 8/14, 5.5 on 8/18, 6.0 on 8/26, 5.5 on 8/31. Today his K was 6.4 with peaked T waves in V3-V6 (with h/o peaked T waves noted on previous EKGs and he was asymptomatic. He was prescribed Kayexylate on an outpatient basis but was not able to afford. He was given Kayexylate 30 grams x 1 in clinic and advised admission to the hospital for hyperkalemia. Denies nausea, weakness, parathesia, palpitations, chest pain, sob, myalgias but did have muscle cramps in and legs after given an iv medication once admitted.   Review of Systems:  General:denies fever/chills  HEENT: resolved h/a, denies vision changes  Cardiac: denies chest pain, palpitations  Pulm: denies sob  Abd: denies ab pain, dysuria, nausea, 2 stools since Kayexylate given  Ext: denies lower ext edema  MSK: +muscle cramps (hands/feet) resolved  Neuro: denies h/a, LOC, weakness, parathesias  Physical Exam:  Blood pressure 181/76, pulse 70, temperature 98.35F (36.7 C), temperature source Oral, resp. rate 16, SpO2 98%.  Vitals reviewed.  General: resting in bed, NAD  HEENT: PERRL, EOMI, no scleral icterus  Cardiac: RRR, no rubs, murmurs or gallops  Pulm: clear to auscultation bilaterally, no wheezes, rales, or rhonchi  Abd: soft, nontender, nondistended, BS present  Ext: warm and well perfused, no pedal edema  Neuro: alert and oriented X3, cranial nerves II-XII grossly intact   Hospital Course by problem list: 1. Hyperkalemia, resolved  (6.4>5.0>4.7>4.9) 2. Lower extremity burning-left foot  3. Low back pain  4. Essential hypertension  5. Eosinophilia  6. Atherosclerosis of leg with intermittent claudication  7. Dyslipidemia 8. Tobacco abuse  73 y.o with hyperkalemia with peaked T waves on EKG.    1. Hyperkalemia, resolved  (6.4>5.0>4.7>4.9) This has been chronic.  Likely  secondary to hyporeninemic hypoaldosteronism per recent labs worked up outpatient. He was given Kayexylate 30 grams in clinic, Calcium gluconate 1 gram, 10 units Novolog, D50 x 2 amps this admission with resolution of hyperkalemia.  He had an US renal to rule out hydronephrosis or renal obstruction causing hyperkalemia which was negative only showing medical renal disease and bilateral renal cysts. We placed on Fludrocortisone 0.05 mg daily and continued HCTZ 25mg . We started with low dose Florinef 0.05 daily (though recommended up to date dose is 0.2 to 1 mg daily) because blood pressure has been uncontrolled in the past due to running out of medications or noncompliance.  Cortisol at 3:10 PM 08/25/14 was 6.7. Consider repeating 8 am cortisol levels as values fluctuate throughout the day. Consider repeat am cortisol  outpatient to rule out adrenal insuffiency normal levels in am are 15-18 to ensure no adrenal insufficiency.  Discussed low K diet and given list of low and high K foods.   2. Lower extremity burning-left foot  This has been chronic and intermittent could be related to PAD, checked folate level (normal), B12 and TSH normal 06/2014. Also could be tinea pedis with cracking to feet and interdigital spaces bilaterally. Advised patient to try OTC fungal creams to see if relieves symptoms.    3. Low back pain  Given Tylenol as needed.   4. Essential hypertension  Initially uncontrolled sbps 180s due to patient being out of medications but controlled at discharge.  Resumed home medications Norvasc 5 mg qd, Coreg 6.25 mg bid, HCTZ 25 mg qd (recently increased dose in clinic), Hytrin 1 mg qhs (recently added) in clinic.    5. Eosinophilia  Appears chronic, unclear etiology. Consider further investigation outpatient.   6. Atherosclerosis of leg with intermittent claudication  Resumed Cilostazol.  Encouraged smoking cessation.   7. Dyslipidemia Continued Lipitor 80 mg home dose   8. Tobacco  abuse Encouraged smoking cessation   Lovenox and compression devices for DVT prophylaxis.    Discharge Vitals:   BP 128/52  Pulse 79  Temp(Src) 98.4 F (36.9 C) (Oral)  Resp 18  Ht 6\' 2"  (1.88 m)  Wt 155 lb 4.7 oz (70.44 kg)  BMI 19.93 kg/m2  SpO2 99%  Discharge physical exam: General: resting in bed, NAD  HEENT: [place]/at, no scleral icterus  Cardiac: RRR, no rubs, murmurs or gallops  Pulm: clear to auscultation bilaterally, no wheezes, rales, or rhonchi  Abd: soft, nontender, nondistended, BS present  Ext: warm and well perfused, no pedal edema  MSK: mild low back ttp  Neuro: alert and oriented X3, cranial nerves II-XII grossly intact  Skin: peeling and cracking to b/l feet and interdigital web  Discharge Labs:  Results for Derrick Hendrix, Derrick Hendrix (MRN [id number]) as of 08/27/2014 14:51  Ref. Range 08/25/2014 15:10  Cortisol - AM Latest Range: 4.3-22.4 ug/dL 6.7   Results for Derrick Hendrix, Derrick Hendrix (MRN [id number]) as of 08/27/2014 14:51  Ref. Range 08/24/2014 09:29 08/24/2014 16:00 08/25/2014 10:45 08/26/2014 02:04 08/26/2014 10:11  Sodium Latest Range: 137-147 mEq/L 142 140 139 139   Potassium Latest Range: 3.7-5.3 mEq/L 6.4 (HH) 5.0 4.7 4.9   Chloride Latest Range: 96-112 mEq/L 109 107 106 107   CO2 Latest Range: 19-32 mEq/L 21 17 (L) 21 20   BUN Latest Range: 6-23 mg/dL 23 23 27  (H) 27 (H)   Creatinine Latest Range: 0.50-1.35 mg/dL 1.38 (H) 1.22 1.45 (H) 1.42 (H)   Calcium Latest Range: 8.4-10.5 mg/dL 9.7 8.8 8.7 8.6   GFR calc non Af Amer Latest Range: >90 mL/min  56 (L) 46 (L) 47 (L)   GFR calc Af Amer Latest Range: >90 mL/min  65 (L) 53 (L) 54 (L)   Glucose Latest Range: 70-99 mg/dL 78 225 (H) 104 (H) 106 (H)   Anion gap Latest Range: 5-15   16 (H) 12 12   Alkaline Phosphatase Latest Range: 39-117 U/L  128 (H)     Albumin Latest Range: 3.5-5.2 g/dL  3.8     AST Latest Range: 0-37 U/L  28     ALT Latest Range: 0-53 U/L  43     Total Protein Latest Range: 6.0-8.3 g/dL  7.4     Total  Bilirubin Latest Range: 0.3-1.2 mg/dL  0.2 (L)     GFR, Est African  American No range found 57 (L)      GFR, Est Non African American No range found 50 (L)      Troponin I Latest Range: <0.30 ng/mL  <0.30     Folate No range found     11.5   Results for Derrick Hendrix, Derrick Hendrix (MRN [id number]) as of 08/27/2014 14:51  Ref. Range 08/25/2014 15:10  Cortisol - AM Latest Range: 4.3-22.4 ug/dL 6.7   Signed: Cresenciano Genre, MD 08/27/2014, 3:30 PM    Services Ordered on Discharge: none Equipment Ordered on Discharge: none

## 2014-08-31 ENCOUNTER — Encounter: Payer: Self-pay | Admitting: Licensed Clinical Social Worker

## 2014-08-31 ENCOUNTER — Ambulatory Visit (HOSPITAL_COMMUNITY)
Admission: RE | Admit: 2014-08-31 | Discharge: 2014-08-31 | Disposition: A | Discharge status: Home / Self Care | Payer: Medicare Other | Source: Ambulatory Visit | Attending: Family Medicine | Admitting: Family Medicine

## 2014-08-31 ENCOUNTER — Encounter: Payer: Self-pay | Admitting: Internal Medicine

## 2014-08-31 ENCOUNTER — Ambulatory Visit (INDEPENDENT_AMBULATORY_CARE_PROVIDER_SITE_OTHER): Payer: Medicare Other | Admitting: Internal Medicine

## 2014-08-31 VITALS — BP 157/73 | HR 87 | Temp 98.2°F | Wt 161.1 lb

## 2014-08-31 DIAGNOSIS — N183 Chronic kidney disease, stage 3 unspecified: Secondary | ICD-10-CM | POA: Insufficient documentation

## 2014-08-31 DIAGNOSIS — E875 Hyperkalemia: Secondary | ICD-10-CM | POA: Diagnosis not present

## 2014-08-31 DIAGNOSIS — F17200 Nicotine dependence, unspecified, uncomplicated: Secondary | ICD-10-CM

## 2014-08-31 DIAGNOSIS — I10 Essential (primary) hypertension: Secondary | ICD-10-CM | POA: Diagnosis not present

## 2014-08-31 DIAGNOSIS — I951 Orthostatic hypotension: Secondary | ICD-10-CM | POA: Diagnosis not present

## 2014-08-31 DIAGNOSIS — F1010 Alcohol abuse, uncomplicated: Secondary | ICD-10-CM | POA: Insufficient documentation

## 2014-08-31 DIAGNOSIS — I129 Hypertensive chronic kidney disease with stage 1 through stage 4 chronic kidney disease, or unspecified chronic kidney disease: Secondary | ICD-10-CM | POA: Diagnosis not present

## 2014-08-31 DIAGNOSIS — E269 Hyperaldosteronism, unspecified: Secondary | ICD-10-CM

## 2014-08-31 DIAGNOSIS — E2749 Other adrenocortical insufficiency: Secondary | ICD-10-CM | POA: Diagnosis not present

## 2014-08-31 DIAGNOSIS — R634 Abnormal weight loss: Secondary | ICD-10-CM

## 2014-08-31 LAB — CORTISOL: CORTISOL PLASMA: 20.3 ug/dL

## 2014-08-31 LAB — BASIC METABOLIC PANEL WITH GFR
BUN: 28 mg/dL — ABNORMAL HIGH (ref 6–23)
CHLORIDE: 107 meq/L (ref 96–112)
CO2: 24 meq/L (ref 19–32)
Calcium: 9.3 mg/dL (ref 8.4–10.5)
Creat: 1.33 mg/dL (ref 0.50–1.35)
GFR, Est African American: 60 mL/min
GFR, Est Non African American: 52 mL/min — ABNORMAL LOW
Glucose, Bld: 119 mg/dL — ABNORMAL HIGH (ref 70–99)
Potassium: 5.6 mEq/L — ABNORMAL HIGH (ref 3.5–5.3)
Sodium: 144 mEq/L (ref 135–145)

## 2014-08-31 MED ORDER — FLUDROCORTISONE ACETATE 0.1 MG PO TABS: 0.0500 mg | ORAL_TABLET | Freq: Every day | ORAL | Status: DC

## 2014-08-31 MED ORDER — SODIUM POLYSTYRENE SULFONATE 15 GM/60ML PO SUSP
30.0000 g | Freq: Once | ORAL | Status: AC
  Administered 2014-08-31: 30 g via ORAL

## 2014-08-31 MED ORDER — SODIUM POLYSTYRENE SULFONATE 15 GM/60ML PO SUSP
30.0000 g | Freq: Once | ORAL | Status: DC
  Filled 2014-08-31: qty 120

## 2014-08-31 NOTE — Progress Notes (Signed)
   Subjective:    Patient ID: Derrick Hendrix, male    DOB: 1941/07/30, 73 y.o.   MRN: [id number]  HPI Derrick Hendrix is a 73 year old man with PMH of Alcohol abuse, tobacco use, PAD, CKD stage 3, HTN, presenting for HFU. He was hospitalized on 9/10 for evaluation of hyperkalemia and was fond to have hypoaldosteronism and was started on fludrocortisone with resolution of his hyperkalemia. Chantix was also discontinued due to concerns of possible hyperkalemia with this medication.  Today he states that he feels well. His BP is mildly elevated but he did not take one of his blood pressure medications, he is not sure which one.   He has not been taking florinef due to the cost of this medication, he reports that it would cost him over $200.    Review of Systems  Constitutional: Negative for fever, chills, diaphoresis, activity change, appetite change, fatigue and unexpected weight change.  Respiratory: Negative for cough, shortness of breath and wheezing.   Cardiovascular: Negative for chest pain, palpitations and leg swelling.  Gastrointestinal: Negative for nausea, vomiting, abdominal pain and diarrhea.  Genitourinary: Negative for dysuria and frequency.  Musculoskeletal: Negative for back pain.  Skin: Negative for color change, pallor and rash.  Neurological: Negative for dizziness, weakness and light-headedness.  Psychiatric/Behavioral: Negative for agitation.       Objective:   Physical Exam  Nursing note and vitals reviewed. Constitutional: He is oriented to person, place, and time. He appears well-developed and well-nourished. No distress.  Eyes: Conjunctivae are normal. No scleral icterus.  Cardiovascular: Normal rate and regular rhythm.   Pulmonary/Chest: Effort normal. No respiratory distress. He has no wheezes. He has no rales.  Abdominal: Soft. There is no tenderness.  Musculoskeletal: He exhibits no edema and no tenderness.  Neurological: He is alert and oriented to person, place,  and time. Coordination normal.  Skin: Skin is warm and dry. He is not diaphoretic.  Psychiatric: He has a normal mood and affect.          Assessment & Plan:

## 2014-08-31 NOTE — Assessment & Plan Note (Addendum)
He needs florinef 0.05mg  daily. (will have to cut a 0.1mg   Cost for 30 days supply for this medication is $14.50 at the [place]. The pt's pharmacy ([facility]) report that the pt no longer has Medicaid.  -CSW consult. [facility] consulted. The pt will have assistance with his medications in the near future.  -The patient was given the actual medication bottle for Florinef 0.05mg  daily paid with the medication emergency funds from the [facility].

## 2014-08-31 NOTE — Assessment & Plan Note (Signed)
He actually gained 5lbs since his last visit. States that his appetite has improved.

## 2014-08-31 NOTE — Assessment & Plan Note (Addendum)
BP Readings from Last 3 Encounters:  08/31/14 157/73  08/26/14 128/52  08/24/14 184/86    Lab Results  Component Value Date   NA 139 08/26/2014   K 4.9 08/26/2014   CREATININE 1.42* 08/26/2014    Assessment: Blood pressure control:  Not controlled Progress toward BP goal:   Not at goal Comments: He is on HCTZ 48m bid, norvasc 5586mdaily. Coreg 6.25 bid, terazosin 86m386mHS. His BP is mildly elevated (not on florinef), he reports not taking one of his BP medications but he is not sure which one. He does not have his medications with him today but explains that he has enough left for almost one month but will need medication assistance to refill them and he has been told that he no longer has Medicaid.   Plan: Medications:  continue current medications Educational resources provided:   Self management tools provided:   Other plans: Follow up on Monday. [facility]

## 2014-08-31 NOTE — Assessment & Plan Note (Addendum)
Due to hypoaldosteronism. Pt recently started on florinef but has not take this medication due to its cost. [facility] (Tim) met with patient as well as CSW. The pt applied for medication plan/assistance and [facility] may help him with his medications in the near future. Florinef 0.1mg  tablets #15 costs $14.50 at the [facility] was paid by the [facility] emergency fund, appreciate help from [facility] financial counselor.  Checking BMET which resulted after the patient left the clinic with potassium of 5.6.  Pt was called and informed of results and instructed to return to the clinic.  -EKG with peaked T waves diffusely but nl QRS complex  -He received kayexalate 30g PO once -He was given a 30-day supply of florinef 0.05mg  daily

## 2014-08-31 NOTE — Progress Notes (Signed)
CSW was able to meet with Mr. Parekh during his scheduled hospital follow up.  Pt was unable to purchase medications from hospital discharge due to inability to afford.  CSW discussed the Extra Help program with Mr. Longmore.  Pt in agreement for CSW to assist with calling [facility] today during visit.  Pt completed ExtraHelp application over the phone today.  During this phone call, pt was encouraged to apply for Medicaid.  Pt in agreement to complete Medicaid application today.  CSW assisted Mr. Luedke with Medicaid application and placed in mail today.  Pt has no phone, as he states he keeps losing them, utilizes friend Vaughan Basta for telephone calls and transportation.  Mr. Dubree pays $91 for friend to drive him to/from medical appointments.  CSW informed Mr. Ibarra of [employer] [facility], and ability to get free to low-cost transportation.  Pt in agreement to complete eligibility form for transportation.  Form completed; however, Mr. Schramm prefers to pay friend to bring him to appointments.  Long term - plan for prescription coverage has been completed by applying for Medicaid and ExtraHelp program.  Short term - Mr. Lilia Pro met with [facility] liaison today to establish services.

## 2014-08-31 NOTE — Assessment & Plan Note (Signed)
Patient states that he has been drinking fluids, staying hydrated.  Checking BMET today.

## 2014-08-31 NOTE — Patient Instructions (Addendum)
-Start taking fludrocortisone 0.05mg  daily (you will need to cut the 0.1mg  tablet in half). This medication is very important to prevent deadly arrhythmias which can make your heat stop.  -Avoid foods hat are high in potassium listed below. You may eat foods low in potassium.  -Follow up on Monday for repeat labs and blood pressure recheck.   Please bring your medicines with you each time you come.   Medicines may be  Eye drops  Herbal   Vitamins  Pills  Seeing these help [place] take care of you.   Hyperkalemia Hyperkalemia means you have too much potassium in your blood. Potassium is a type of salt in the blood (electrolyte). Normally, your kidneys remove potassium from the body. Too much potassium can be life-threatening. HOME CARE  Only take medicine as told by your doctor.  Do not take vitamins or natural products unless your doctor says they are okay.  Keep all doctor visits as told.  Follow diet instructions as told by your doctor. GET HELP RIGHT AWAY IF:  Your heartbeat is not regular or very slow.  You feel dizzy (lightheaded).  You feel weak.  You are short of breath.  You have chest pain.  You pass out (faint). MAKE SURE YOU:   Understand these instructions.  Will watch your condition.  Will get help right away if you are not doing well or get worse. Document Released: 12/01/2005 Document Revised: 02/23/2012 Document Reviewed: 03/08/2014 [facility] Patient Information 2015 [place], [place]. This information is not intended to replace advice given to you by your health care provider. Make sure you discuss any questions you have with your health care provider.   Potassium Content of Foods Potassium is a mineral found in many foods and drinks. It helps keep fluids and minerals balanced in your body and affects how steadily your heart beats. Potassium also helps control your blood pressure and keep your muscles and nervous system healthy. Certain health  conditions and medicines may change the balance of potassium in your body. When this happens, you can help balance your level of potassium through the foods that you do or do not eat. Your health care provider or dietitian may recommend an amount of potassium that you should have each day. The following lists of foods provide the amount of potassium (in parentheses) per serving in each item. HIGH IN POTASSIUM  The following foods and beverages have 200 mg or more of potassium per serving:  Apricots, 2 raw or 5 dry (200 mg).  Artichoke, 1 medium (345 mg).  Avocado, raw,  each (245 mg).  Banana, 1 medium (425 mg).  Beans, lima, or baked beans, canned,  cup (280 mg).  Beans, white, canned,  cup (595 mg).  Beef roast, 3 oz (320 mg).  Beef, ground, 3 oz (270 mg).  Beets, raw or cooked,  cup (260 mg).  Bran muffin, 2 oz (300 mg).  Broccoli,  cup (230 mg).  Brussels sprouts,  cup (250 mg).  Cantaloupe,  cup (215 mg).  Cereal, 100% bran,  cup (200-400 mg).  Cheeseburger, single, fast food, 1 each (225-400 mg).  Chicken, 3 oz (220 mg).  Clams, canned, 3 oz (535 mg).  Crab, 3 oz (225 mg).  Dates, 5 each (270 mg).  Dried beans and peas,  cup (300-475 mg).  Figs, dried, 2 each (260 mg).  Fish: halibut, tuna, cod, snapper, 3 oz (480 mg).  Fish: salmon, haddock, swordfish, perch, 3 oz (300 mg).  Fish, tuna, canned  3 oz (200 mg).  [place] fries, fast food, 3 oz (470 mg).  Granola with fruit and nuts,  cup (200 mg).  Grapefruit juice,  cup (200 mg).  Greens, beet,  cup (655 mg).  Honeydew melon,  cup (200 mg).  Kale, raw, 1 cup (300 mg).  Kiwi, 1 medium (240 mg).  Kohlrabi, rutabaga, parsnips,  cup (280 mg).  Lentils,  cup (365 mg).  Mango, 1 each (325 mg).  Milk, chocolate, 1 cup (420 mg).  Milk: nonfat, low-fat, whole, buttermilk, 1 cup (350-380 mg).  Molasses, 1 Tbsp (295 mg).  Mushrooms,  cup (280) mg.  Nectarine, 1 each (275  mg).  Nuts: almonds, peanuts, hazelnuts, [place], cashew, mixed, 1 oz (200 mg).  Nuts, pistachios, 1 oz (295 mg).  Orange, 1 each (240 mg).  Orange juice,  cup (235 mg).  Papaya, medium,  fruit (390 mg).  Peanut butter, chunky, 2 Tbsp (240 mg).  Peanut butter, smooth, 2 Tbsp (210 mg).  Pear, 1 medium (200 mg).  Pomegranate, 1 whole (400 mg).  Pomegranate juice,  cup (215 mg).  Pork, 3 oz (350 mg).  Potato chips, salted, 1 oz (465 mg).  Potato, baked with skin, 1 medium (925 mg).  Potatoes, boiled,  cup (255 mg).  Potatoes, mashed,  cup (330 mg).  Prune juice,  cup (370 mg).  Prunes, 5 each (305 mg).  Pudding, chocolate,  cup (230 mg).  Pumpkin, canned,  cup (250 mg).  Raisins, seedless,  cup (270 mg).  Seeds, sunflower or pumpkin, 1 oz (240 mg).  Soy milk, 1 cup (300 mg).  Spinach,  cup (420 mg).  Spinach, canned,  cup (370 mg).  Sweet potato, baked with skin, 1 medium (450 mg).  Swiss chard,  cup (480 mg).  Tomato or vegetable juice,  cup (275 mg).  Tomato sauce or puree,  cup (400-550 mg).  Tomato, raw, 1 medium (290 mg).  Tomatoes, canned,  cup (200-300 mg).  [place], 3 oz (250 mg).  Wheat germ, 1 oz (250 mg).  Winter squash,  cup (250 mg).  Yogurt, plain or fruited, 6 oz (260-435 mg).  Zucchini,  cup (220 mg). MODERATE IN POTASSIUM The following foods and beverages have 50-200 mg of potassium per serving:  Apple, 1 each (150 mg).  Apple juice,  cup (150 mg).  Applesauce,  cup (90 mg).  Apricot nectar,  cup (140 mg).  Asparagus, small spears,  cup or 6 spears (155 mg).  Bagel, cinnamon raisin, 1 each (130 mg).  Bagel, egg or plain, 4 in., 1 each (70 mg).  Beans, green,  cup (90 mg).  Beans, yellow,  cup (190 mg).  Beer, regular, 12 oz (100 mg).  Beets, canned,  cup (125 mg).  Blackberries,  cup (115 mg).  Blueberries,  cup (60 mg).  Bread, whole wheat, 1 slice (70 mg).  Broccoli, raw,   cup (145 mg).  Cabbage,  cup (150 mg).  Carrots, cooked or raw,  cup (180 mg).  Cauliflower, raw,  cup (150 mg).  Celery, raw,  cup (155 mg).  Cereal, bran flakes, cup (120-150 mg).  Cheese, cottage,  cup (110 mg).  Cherries, 10 each (150 mg).  Chocolate, 1 oz bar (165 mg).  Coffee, brewed 6 oz (90 mg).  Corn,  cup or 1 ear (195 mg).  Cucumbers,  cup (80 mg).  Egg, large, 1 each (60 mg).  Eggplant,  cup (60 mg).  Endive, raw, cup (80 mg).  English muffin, 1 each (  65 mg).  Fish, orange roughy, 3 oz (150 mg).  Frankfurter, beef or pork, 1 each (75 mg).  Fruit cocktail,  cup (115 mg).  Grape juice,  cup (170 mg).  Grapefruit,  fruit (175 mg).  Grapes,  cup (155 mg).  Greens: kale, turnip, collard,  cup (110-150 mg).  Ice cream or frozen yogurt, chocolate,  cup (175 mg).  Ice cream or frozen yogurt, vanilla,  cup (120-150 mg).  Lemons, limes, 1 each (80 mg).  Lettuce, all types, 1 cup (100 mg).  Mixed vegetables,  cup (150 mg).  Mushrooms, raw,  cup (110 mg).  Nuts: walnuts, pecans, or macadamia, 1 oz (125 mg).  Oatmeal,  cup (80 mg).  Okra,  cup (110 mg).  Onions, raw,  cup (120 mg).  Peach, 1 each (185 mg).  Peaches, canned,  cup (120 mg).  Pears, canned,  cup (120 mg).  Peas, green, frozen,  cup (90 mg).  Peppers, green,  cup (130 mg).  Peppers, red,  cup (160 mg).  Pineapple juice,  cup (165 mg).  Pineapple, fresh or canned,  cup (100 mg).  Plums, 1 each (105 mg).  Pudding, vanilla,  cup (150 mg).  Raspberries,  cup (90 mg).  Rhubarb,  cup (115 mg).  Rice, wild,  cup (80 mg).  Shrimp, 3 oz (155 mg).  Spinach, raw, 1 cup (170 mg).  Strawberries,  cup (125 mg).  Summer squash  cup (175-200 mg).  Swiss chard, raw, 1 cup (135 mg).  Tangerines, 1 each (140 mg).  Tea, brewed, 6 oz (65 mg).  Turnips,  cup (140 mg).  Watermelon,  cup (85 mg).  Wine, red, table, 5 oz (180  mg).  Wine, white, table, 5 oz (100 mg). LOW IN POTASSIUM The following foods and beverages have less than 50 mg of potassium per serving.  Bread, white, 1 slice (30 mg).  Carbonated beverages, 12 oz (less than 5 mg).  Cheese, 1 oz (20-30 mg).  Cranberries,  cup (45 mg).  Cranberry juice cocktail,  cup (20 mg).  Fats and oils, 1 Tbsp (less than 5 mg).  Hummus, 1 Tbsp (32 mg).  Nectar: papaya, mango, or pear,  cup (35 mg).  Rice, white or brown,  cup (50 mg).  Spaghetti or macaroni,  cup cooked (30 mg).  Tortilla, flour or corn, 1 each (50 mg).  Waffle, 4 in., 1 each (50 mg).  Water chestnuts,  cup (40 mg). Document Released: 07/15/2005 Document Revised: 12/06/2013 Document Reviewed: 10/28/2013 [facility] Patient Information 2015 [place], [place]. This information is not intended to replace advice given to you by your health care provider. Make sure you discuss any questions you have with your health care provider.

## 2014-08-31 NOTE — Assessment & Plan Note (Signed)
Reports that he has not had an alcoholic beverage over 1 month.

## 2014-08-31 NOTE — Assessment & Plan Note (Signed)
No dizziness reported today.  Will check am cortisol per recs from inpatient team

## 2014-08-31 NOTE — Assessment & Plan Note (Signed)
  Assessment: Progress toward smoking cessation:   None Barriers to progress toward smoking cessation:   withdrawal symptoms Comments: He was on Chantix but this was dcd due to concerns for hyperkalemia. He has 60 pack-year smoking history.   Plan: Instruction/counseling given:  I counseled patient on the dangers of tobacco use, advised patient to stop smoking, and reviewed strategies to maximize success. Educational resources provided:    Self management tools provided:    Medications to assist with smoking cessation:  None Patient agreed to the following self-care plans for smoking cessation:    Other plans: He wants to gradually cut back on smoking until he quits

## 2014-08-31 NOTE — Progress Notes (Signed)
Medicine attending: Medical history, presenting problems, physical findings, and medications, reviewed with resident physician Dr. Kennerly and I concur with her evaluation and management plan. James Granfortuna, M.D., FACP 

## 2014-09-04 ENCOUNTER — Encounter: Payer: Medicare Other | Admitting: Internal Medicine

## 2014-09-04 ENCOUNTER — Telehealth: Payer: Self-pay | Admitting: *Deleted

## 2014-09-04 NOTE — Telephone Encounter (Signed)
Pt walked in to clinic - did not have an appt- c/o of h/a since starting new med from clinic.  Offered only appt today Dr Hayes Ludwig 2:45PM. Pt states he can not come back to clinic and will keep appt on  09/07/14 with Dr Hayes Ludwig. Pt decided to stop med himself. If h/a continue call clinic back. Hilda Blades Derrick Kamel RN 09/04/14 9:50AM

## 2014-09-07 ENCOUNTER — Ambulatory Visit (INDEPENDENT_AMBULATORY_CARE_PROVIDER_SITE_OTHER): Payer: Medicare Other | Admitting: Internal Medicine

## 2014-09-07 ENCOUNTER — Encounter: Payer: Self-pay | Admitting: Internal Medicine

## 2014-09-07 VITALS — BP 170/77 | HR 87 | Temp 97.7°F | Ht 74.0 in | Wt 158.9 lb

## 2014-09-07 DIAGNOSIS — I10 Essential (primary) hypertension: Secondary | ICD-10-CM

## 2014-09-07 DIAGNOSIS — R634 Abnormal weight loss: Secondary | ICD-10-CM | POA: Diagnosis not present

## 2014-09-07 DIAGNOSIS — I70219 Atherosclerosis of native arteries of extremities with intermittent claudication, unspecified extremity: Secondary | ICD-10-CM | POA: Diagnosis not present

## 2014-09-07 DIAGNOSIS — N179 Acute kidney failure, unspecified: Secondary | ICD-10-CM | POA: Diagnosis not present

## 2014-09-07 DIAGNOSIS — Z0000 Encounter for general adult medical examination without abnormal findings: Secondary | ICD-10-CM | POA: Diagnosis not present

## 2014-09-07 DIAGNOSIS — R51 Headache: Secondary | ICD-10-CM | POA: Diagnosis not present

## 2014-09-07 DIAGNOSIS — E875 Hyperkalemia: Secondary | ICD-10-CM

## 2014-09-07 DIAGNOSIS — E538 Deficiency of other specified B group vitamins: Secondary | ICD-10-CM | POA: Diagnosis not present

## 2014-09-07 DIAGNOSIS — R519 Headache, unspecified: Secondary | ICD-10-CM

## 2014-09-07 DIAGNOSIS — F17200 Nicotine dependence, unspecified, uncomplicated: Secondary | ICD-10-CM | POA: Diagnosis not present

## 2014-09-07 DIAGNOSIS — N183 Chronic kidney disease, stage 3 unspecified: Secondary | ICD-10-CM | POA: Diagnosis not present

## 2014-09-07 DIAGNOSIS — I209 Angina pectoris, unspecified: Secondary | ICD-10-CM | POA: Diagnosis not present

## 2014-09-07 DIAGNOSIS — E2749 Other adrenocortical insufficiency: Secondary | ICD-10-CM

## 2014-09-07 DIAGNOSIS — I951 Orthostatic hypotension: Secondary | ICD-10-CM | POA: Diagnosis not present

## 2014-09-07 MED ORDER — TERAZOSIN HCL 1 MG PO CAPS: 1.0000 mg | ORAL_CAPSULE | Freq: Every day | ORAL | Status: DC

## 2014-09-07 MED ORDER — CARVEDILOL 6.25 MG PO TABS: 6.2500 mg | ORAL_TABLET | Freq: Two times a day (BID) | ORAL | Status: DC

## 2014-09-07 MED ORDER — NITROGLYCERIN 0.4 MG SL SUBL: 0.4000 mg | SUBLINGUAL_TABLET | SUBLINGUAL | Status: DC | PRN

## 2014-09-07 MED ORDER — SIMVASTATIN 20 MG PO TABS: 20.0000 mg | ORAL_TABLET | Freq: Every day | ORAL | Status: DC

## 2014-09-07 MED ORDER — FUROSEMIDE 20 MG PO TABS: 20.0000 mg | ORAL_TABLET | Freq: Every day | ORAL | Status: DC

## 2014-09-07 MED ORDER — AMLODIPINE BESYLATE 5 MG PO TABS: 5.0000 mg | ORAL_TABLET | Freq: Every day | ORAL | Status: DC

## 2014-09-07 MED ORDER — FUROSEMIDE 40 MG PO TABS: 20.0000 mg | ORAL_TABLET | Freq: Every day | ORAL | Status: DC

## 2014-09-07 MED ORDER — CILOSTAZOL 100 MG PO TABS: 100.0000 mg | ORAL_TABLET | Freq: Two times a day (BID) | ORAL | Status: DC

## 2014-09-07 NOTE — Assessment & Plan Note (Signed)
Cortisol level normal.  He is not a good candidate for florinef given his HTN.  Will start Lasix and continue low potassium diet.  BMET during next visit.

## 2014-09-07 NOTE — Assessment & Plan Note (Addendum)
Due to hypoaldosteronism. He continue to avoid high potassium foods. His neighbor was provided a list of high potassium foods and will help the patient avoid eating them.  Started Lasix 20mg  daily Will need BMET during this next visit.

## 2014-09-07 NOTE — Patient Instructions (Addendum)
-Stop taking florinef.  -Start taking Lasix 20mg  daily (half a tablet). This is a fluid pill, take it not later than 3 PM.  -Continue taking Coreg, simvastatin, amlodipine, and start taking terazosin.  -Continue avoiding high potassium foods.  -Check your blood pressure at home once per day and bring this information with you during your next appointment.  -Follow up with [place] next Thursday for blood pressured recheck and for repeat labs to check your potassium.   Thank you for bringing your medicines today. This helps [place] keep you safe from mistakes.   Potassium Content of Foods Potassium is a mineral found in many foods and drinks. It helps keep fluids and minerals balanced in your body and affects how steadily your heart beats. Potassium also helps control your blood pressure and keep your muscles and nervous system healthy. Certain health conditions and medicines may change the balance of potassium in your body. When this happens, you can help balance your level of potassium through the foods that you do or do not eat. Your health care provider or dietitian may recommend an amount of potassium that you should have each day. The following lists of foods provide the amount of potassium (in parentheses) per serving in each item. HIGH IN POTASSIUM  The following foods and beverages have 200 mg or more of potassium per serving:  Apricots, 2 raw or 5 dry (200 mg).  Artichoke, 1 medium (345 mg).  Avocado, raw,  each (245 mg).  Banana, 1 medium (425 mg).  Beans, lima, or baked beans, canned,  cup (280 mg).  Beans, white, canned,  cup (595 mg).  Beef roast, 3 oz (320 mg).  Beef, ground, 3 oz (270 mg).  Beets, raw or cooked,  cup (260 mg).  Bran muffin, 2 oz (300 mg).  Broccoli,  cup (230 mg).  Brussels sprouts,  cup (250 mg).  Cantaloupe,  cup (215 mg).  Cereal, 100% bran,  cup (200-400 mg).  Cheeseburger, single, fast food, 1 each (225-400 mg).  Chicken, 3 oz (220  mg).  Clams, canned, 3 oz (535 mg).  Crab, 3 oz (225 mg).  Dates, 5 each (270 mg).  Dried beans and peas,  cup (300-475 mg).  Figs, dried, 2 each (260 mg).  Fish: halibut, tuna, cod, snapper, 3 oz (480 mg).  Fish: salmon, haddock, swordfish, perch, 3 oz (300 mg).  Fish, tuna, canned 3 oz (200 mg).  [place] fries, fast food, 3 oz (470 mg).  Granola with fruit and nuts,  cup (200 mg).  Grapefruit juice,  cup (200 mg).  Greens, beet,  cup (655 mg).  Honeydew melon,  cup (200 mg).  Kale, raw, 1 cup (300 mg).  Kiwi, 1 medium (240 mg).  Kohlrabi, rutabaga, parsnips,  cup (280 mg).  Lentils,  cup (365 mg).  Mango, 1 each (325 mg).  Milk, chocolate, 1 cup (420 mg).  Milk: nonfat, low-fat, whole, buttermilk, 1 cup (350-380 mg).  Molasses, 1 Tbsp (295 mg).  Mushrooms,  cup (280) mg.  Nectarine, 1 each (275 mg).  Nuts: almonds, peanuts, hazelnuts, [place], cashew, mixed, 1 oz (200 mg).  Nuts, pistachios, 1 oz (295 mg).  Orange, 1 each (240 mg).  Orange juice,  cup (235 mg).  Papaya, medium,  fruit (390 mg).  Peanut butter, chunky, 2 Tbsp (240 mg).  Peanut butter, smooth, 2 Tbsp (210 mg).  Pear, 1 medium (200 mg).  Pomegranate, 1 whole (400 mg).  Pomegranate juice,  cup (215 mg).  Pork, 3  oz (350 mg).  Potato chips, salted, 1 oz (465 mg).  Potato, baked with skin, 1 medium (925 mg).  Potatoes, boiled,  cup (255 mg).  Potatoes, mashed,  cup (330 mg).  Prune juice,  cup (370 mg).  Prunes, 5 each (305 mg).  Pudding, chocolate,  cup (230 mg).  Pumpkin, canned,  cup (250 mg).  Raisins, seedless,  cup (270 mg).  Seeds, sunflower or pumpkin, 1 oz (240 mg).  Soy milk, 1 cup (300 mg).  Spinach,  cup (420 mg).  Spinach, canned,  cup (370 mg).  Sweet potato, baked with skin, 1 medium (450 mg).  Swiss chard,  cup (480 mg).  Tomato or vegetable juice,  cup (275 mg).  Tomato sauce or puree,  cup (400-550 mg).  Tomato,  raw, 1 medium (290 mg).  Tomatoes, canned,  cup (200-300 mg).  [place], 3 oz (250 mg).  Wheat germ, 1 oz (250 mg).  Winter squash,  cup (250 mg).  Yogurt, plain or fruited, 6 oz (260-435 mg).  Zucchini,  cup (220 mg). MODERATE IN POTASSIUM The following foods and beverages have 50-200 mg of potassium per serving:  Apple, 1 each (150 mg).  Apple juice,  cup (150 mg).  Applesauce,  cup (90 mg).  Apricot nectar,  cup (140 mg).  Asparagus, small spears,  cup or 6 spears (155 mg).  Bagel, cinnamon raisin, 1 each (130 mg).  Bagel, egg or plain, 4 in., 1 each (70 mg).  Beans, green,  cup (90 mg).  Beans, yellow,  cup (190 mg).  Beer, regular, 12 oz (100 mg).  Beets, canned,  cup (125 mg).  Blackberries,  cup (115 mg).  Blueberries,  cup (60 mg).  Bread, whole wheat, 1 slice (70 mg).  Broccoli, raw,  cup (145 mg).  Cabbage,  cup (150 mg).  Carrots, cooked or raw,  cup (180 mg).  Cauliflower, raw,  cup (150 mg).  Celery, raw,  cup (155 mg).  Cereal, bran flakes, cup (120-150 mg).  Cheese, cottage,  cup (110 mg).  Cherries, 10 each (150 mg).  Chocolate, 1 oz bar (165 mg).  Coffee, brewed 6 oz (90 mg).  Corn,  cup or 1 ear (195 mg).  Cucumbers,  cup (80 mg).  Egg, large, 1 each (60 mg).  Eggplant,  cup (60 mg).  Endive, raw, cup (80 mg).  English muffin, 1 each (65 mg).  Fish, orange roughy, 3 oz (150 mg).  Frankfurter, beef or pork, 1 each (75 mg).  Fruit cocktail,  cup (115 mg).  Grape juice,  cup (170 mg).  Grapefruit,  fruit (175 mg).  Grapes,  cup (155 mg).  Greens: kale, turnip, collard,  cup (110-150 mg).  Ice cream or frozen yogurt, chocolate,  cup (175 mg).  Ice cream or frozen yogurt, vanilla,  cup (120-150 mg).  Lemons, limes, 1 each (80 mg).  Lettuce, all types, 1 cup (100 mg).  Mixed vegetables,  cup (150 mg).  Mushrooms, raw,  cup (110 mg).  Nuts: walnuts, pecans, or macadamia, 1  oz (125 mg).  Oatmeal,  cup (80 mg).  Okra,  cup (110 mg).  Onions, raw,  cup (120 mg).  Peach, 1 each (185 mg).  Peaches, canned,  cup (120 mg).  Pears, canned,  cup (120 mg).  Peas, green, frozen,  cup (90 mg).  Peppers, green,  cup (130 mg).  Peppers, red,  cup (160 mg).  Pineapple juice,  cup (165 mg).  Pineapple, fresh or canned,  cup (100 mg).  Plums, 1 each (105 mg).  Pudding, vanilla,  cup (150 mg).  Raspberries,  cup (90 mg).  Rhubarb,  cup (115 mg).  Rice, wild,  cup (80 mg).  Shrimp, 3 oz (155 mg).  Spinach, raw, 1 cup (170 mg).  Strawberries,  cup (125 mg).  Summer squash  cup (175-200 mg).  Swiss chard, raw, 1 cup (135 mg).  Tangerines, 1 each (140 mg).  Tea, brewed, 6 oz (65 mg).  Turnips,  cup (140 mg).  Watermelon,  cup (85 mg).  Wine, red, table, 5 oz (180 mg).  Wine, white, table, 5 oz (100 mg). LOW IN POTASSIUM The following foods and beverages have less than 50 mg of potassium per serving.  Bread, white, 1 slice (30 mg).  Carbonated beverages, 12 oz (less than 5 mg).  Cheese, 1 oz (20-30 mg).  Cranberries,  cup (45 mg).  Cranberry juice cocktail,  cup (20 mg).  Fats and oils, 1 Tbsp (less than 5 mg).  Hummus, 1 Tbsp (32 mg).  Nectar: papaya, mango, or pear,  cup (35 mg).  Rice, white or brown,  cup (50 mg).  Spaghetti or macaroni,  cup cooked (30 mg).  Tortilla, flour or corn, 1 each (50 mg).  Waffle, 4 in., 1 each (50 mg).  Water chestnuts,  cup (40 mg). Document Released: 07/15/2005 Document Revised: 12/06/2013 Document Reviewed: 10/28/2013 [facility] Patient Information 2015 [place], [place]. This information is not intended to replace advice given to you by your health care provider. Make sure you discuss any questions you have with your health care provider.

## 2014-09-07 NOTE — Assessment & Plan Note (Signed)
BP Readings from Last 3 Encounters:  09/07/14 170/77  08/31/14 157/73  08/26/14 128/52    Lab Results  Component Value Date   NA 144 08/31/2014   K 5.6* 08/31/2014   CREATININE 1.33 08/31/2014    Assessment: Blood pressure control:  Not controlled Progress toward BP goal:   Not at goal Comments: He has not taken any of his medications. He has his medication bottles with him but they are mostly empty. Will restart amlodipine 5mg  daily, Coreg 6.25mg  BID, terazosin 1mg  qHs, and start lasix 20mg  daily  Plan: Medications:  changes per above. The above medications will cost him $20 per month at [facility] (with their discount plan) and he assures me that he will be able to afford them.  Educational resources provided: Therapist, sports tools provided:   Other plans: Follow up in 1 week. His neighbor will check his BP once per day for 1 week and bring this info to his next appointment.

## 2014-09-07 NOTE — Assessment & Plan Note (Signed)
His headache was likely due to elevated BP in context of taking florinef but not his antihypertensives.  Discontinue florinef, resume antihypertensives per above.

## 2014-09-07 NOTE — Progress Notes (Signed)
   Subjective:    Patient ID: Derrick Hendrix, male    DOB: [birth year], 73 y.o.   MRN: [id number]  HPI [name] is a 73 year old man with PMH of Alcohol abuse, tobacco use, PAD, CKD stage 3, HTN, presenting accompanied by his neighbor for follow up visit for this hypoaldosteronism.  He sates that he took the florinef (this was given to him during his last visit at the [facility]) but that he stopped taking this medicine on Sunday because he had developed a right sided headache with blurry vision. The headache and blurry vision resolved by Monday. He was not taking his other blood pressure medications.  He tells me he has been avoiding potassium rich foods such as tomatoes but wants his friend to also have a copy of the list of potassium-rich foods to help him avoid eating them. He tells me he is still unable to afford his medications.     Review of Systems  Constitutional: Negative for fever, chills, diaphoresis, activity change, appetite change, fatigue and unexpected weight change.  Eyes: Positive for visual disturbance.  Respiratory: Negative for cough and shortness of breath.   Cardiovascular: Negative for chest pain and leg swelling.  Gastrointestinal: Negative for vomiting, abdominal pain and diarrhea.  Genitourinary: Positive for difficulty urinating. Negative for dysuria.  Musculoskeletal: Negative for back pain.  Neurological: Positive for headaches. Negative for dizziness, syncope, speech difficulty, weakness, light-headedness and numbness.  Psychiatric/Behavioral: Negative for confusion and agitation.       Objective:   Physical Exam  Nursing note and vitals reviewed. Constitutional: He is oriented to person, place, and time. He appears well-developed and well-nourished. No distress.  Cardiovascular: Normal rate and regular rhythm.   Pulmonary/Chest: Effort normal. No respiratory distress.  Musculoskeletal: He exhibits no edema and no tenderness.  Neurological: He is alert and  oriented to person, place, and time. Coordination normal.  Skin: Skin is warm and dry. He is not diaphoretic.  Psychiatric: He has a normal mood and affect.          Assessment & Plan:

## 2014-09-07 NOTE — Assessment & Plan Note (Signed)
Refilled his Pletal 100mg  BID, sent to [facility] but will cost $18 per month which the pt is not sure he can afford.

## 2014-09-07 NOTE — Assessment & Plan Note (Signed)
>>  ASSESSMENT AND PLAN FOR ATHEROSCLEROSIS OF LEG WITH INTERMITTENT CLAUDICATION, R WRITTEN ON 09/07/2014  6:37 PM BY Dusty Gin D, MD  Refilled his Pletal  100mg  BID, sent to [facility] but will cost $18 per month which the pt is not sure he can afford.

## 2014-09-11 NOTE — Progress Notes (Signed)
Medicine attending: Medical history, presenting problems, physical findings, medications, reviewed with resident physician Dr. Hayes Ludwig on the day of the patient's clinic visit. Concur with her evaluation and management plan. Murriel Hopper, M.D., [place]

## 2014-09-14 ENCOUNTER — Encounter: Payer: Self-pay | Admitting: Internal Medicine

## 2014-09-14 ENCOUNTER — Ambulatory Visit: Payer: Medicare Other | Admitting: Internal Medicine

## 2014-09-27 ENCOUNTER — Ambulatory Visit (INDEPENDENT_AMBULATORY_CARE_PROVIDER_SITE_OTHER): Payer: Medicare Other | Admitting: Internal Medicine

## 2014-09-27 ENCOUNTER — Telehealth: Payer: Self-pay | Admitting: Internal Medicine

## 2014-09-27 ENCOUNTER — Encounter: Payer: Self-pay | Admitting: Internal Medicine

## 2014-09-27 VITALS — BP 129/71 | HR 78 | Temp 97.4°F | Ht 74.0 in | Wt 159.7 lb

## 2014-09-27 DIAGNOSIS — R634 Abnormal weight loss: Secondary | ICD-10-CM | POA: Diagnosis not present

## 2014-09-27 DIAGNOSIS — I208 Other forms of angina pectoris: Secondary | ICD-10-CM | POA: Diagnosis not present

## 2014-09-27 DIAGNOSIS — I10 Essential (primary) hypertension: Secondary | ICD-10-CM | POA: Diagnosis not present

## 2014-09-27 LAB — BASIC METABOLIC PANEL WITH GFR
BUN: 44 mg/dL — ABNORMAL HIGH (ref 6–23)
CHLORIDE: 110 meq/L (ref 96–112)
CO2: 21 mEq/L (ref 19–32)
CREATININE: 1.86 mg/dL — AB (ref 0.50–1.35)
Calcium: 9.1 mg/dL (ref 8.4–10.5)
GFR, Est African American: 40 mL/min — ABNORMAL LOW
GFR, Est Non African American: 35 mL/min — ABNORMAL LOW
Glucose, Bld: 86 mg/dL (ref 70–99)
POTASSIUM: 6.3 meq/L — AB (ref 3.5–5.3)
Sodium: 140 mEq/L (ref 135–145)

## 2014-09-27 NOTE — Progress Notes (Signed)
Patient ID: Derrick Hendrix, male   DOB: 08-25-1941, 73 y.o.   MRN: [id number]     Subjective:   HPI: [name] is a 73 y.o. gentleman with PMH of Alcohol abuse, tobacco use, PAD, CKD stage 3, and HTN,   Reason(s) for this visit: Questions regarding his medications: Patient is on multiple medications mostly for his Hypertension treatment and would like to know whether he is taking his medications correctly. He brings in his pill bottles and he also has pillboxes, which are fixed by apparently, by a home health nurse weekly.  Kindly see the A&P for the status of the pt's chronic medical problems.  ROS: Constitutional: Denies fever, chills, diaphoresis, appetite change and fatigue.  Respiratory: Denies SOB, DOE, cough, chest tightness, and wheezing. Denies chest pain. CVS: No chest pain, palpitations and leg swelling.  GI: No abdominal pain, nausea, vomiting, bloody stools GU: No dysuria, frequency, hematuria, or flank pain.  MSK: No myalgias, back pain, joint swelling, arthralgias  Psych: No depression symptoms. No SI or SA.    Objective:  Physical Exam: Filed Vitals:   09/27/14 0933  BP: 129/71  Pulse: 78  Temp: 97.4 F (36.3 C)  TempSrc: Oral  Height: 6\' 2"  (1.88 m)  Weight: 159 lb 11.2 oz (72.439 kg)  SpO2: 100%   General: Well nourished. No acute distress. Accompanied by his friend.  HEENT: Normal oral mucosa. MMM.  Lungs: CTA bilaterally. Heart: RRR; no extra sounds or murmurs  Abdomen: Non-distended, normal bowel sounds, soft, nontender; no hepatosplenomegaly  Extremities: No pedal edema. No joint swelling or tenderness. Neurologic: Normal EOM,  Alert and oriented x3. No obvious neurologic/cranial nerve deficits.  Assessment & Plan:  Discussed case with my attending in the clinic, Dr. Dareen Piano See problem based charting.

## 2014-09-27 NOTE — Assessment & Plan Note (Signed)
BP Readings from Last 3 Encounters:  09/27/14 129/71  09/07/14 170/77  08/31/14 157/73    Lab Results  Component Value Date   NA 144 08/31/2014   K 5.6* 08/31/2014   CREATININE 1.33 08/31/2014    Assessment: Blood pressure control: controlled Progress toward BP goal:  at goal Comments: His pill boxes are all properly filled up. He seems to be compliant with his medications even though he needs help to have the boxes filled. Reviewed his medications with him. I thanked him for bring in his pill bottles.  Plan: Medications:  Continue with terazosin 1 mg at bedtime, Coreg 6.25 mg twice daily, amlodipine, 5 mg daily, and furosemide 20 mg daily. Educational resources provided:   Self management tools provided:   Other plans: Followup with PCP in 1-2 months. Encouraged patient to continue bringing his medications at every visit.

## 2014-09-27 NOTE — Assessment & Plan Note (Signed)
Patient had questions regarding what he can eat. Encouraged him to keep a health diet comprising mostly of fruits, vegetables, and avoiding high fat diet and sugars. His weight seems to be on the low side but is stable at around 158 pounds. I encouraged him to take in good protein including eggs, milk, and chicken. Encouraged him to avoid red meats.

## 2014-09-27 NOTE — Patient Instructions (Signed)
General Instructions: Continue with your medications.  Thank you for bringing your medicines today. This helps [place] keep you safe from mistakes.   Progress Toward Treatment Goals:  Treatment Goal 09/27/2014  Blood pressure at goal  Stop smoking smoking the same amount    Self Care Goals & Plans:  Self Care Goal 09/27/2014  Manage my medications take my medicines as prescribed; bring my medications to every visit; refill my medications on time  Eat healthy foods eat more vegetables; eat foods that are low in salt; eat baked foods instead of fried foods  Be physically active -  Stop smoking (No Data)    No flowsheet data found.   Care Management & Community Referrals:  No flowsheet data found.

## 2014-09-27 NOTE — Telephone Encounter (Signed)
  Reason for call:   I placed an outgoing call to Mr. Ronak Tota at 9:55PM regarding a critical lab value from [facility] of a potassium of 6.3 today.  I called the phone number in the chart but no one answered.  I called a second number and Ms. Pat Patrick answered who is a friend of Mr. Malave.  She told me he did not have a phone.  I told her that he needed to come to the [facility] ED to have his potassium rechecked and give him medication to lower his potassium.  She stated that she would try to get in touch with him.  I provided our phone number for her to call us back.    I received a page at 10:54, stating that Ms. Claiborne Billings has been unable to get in touch with Mr. Hallmark because he doesn't have a phone.  I asked her if there may be someone that could go over to his house and let him know that he needs to come to the ED.  She wondered if she couldn't get him to come, what should she do.  I told her that it was very important that he come to the ED, but if she could not convince him to come, that he should come to the clinic the first thing in the morning.  Again, she stated she would do her best to get in touch with him and try to convince him to come to the ED.      Assessment/ Plan:   Hyperkalemia: It appears that he has experienced this issue in the past due to hypoaldosteronism.  He was advised to avoid high potassium foods and take florinet but apparently has been unable to afford this?  It may be prudent to provide him with a prescription of kayexalate to take PRN for these situations since they appear to be a continuing problem.    As always, pt is advised that if symptoms worsen or new symptoms arise, they should go to an urgent care facility or to to ER for further evaluation.   Jones Bales, MD   09/27/2014, 10:59 PM

## 2014-09-28 ENCOUNTER — Ambulatory Visit (INDEPENDENT_AMBULATORY_CARE_PROVIDER_SITE_OTHER): Payer: Medicare Other | Admitting: Internal Medicine

## 2014-09-28 ENCOUNTER — Telehealth: Payer: Self-pay | Admitting: Internal Medicine

## 2014-09-28 ENCOUNTER — Other Ambulatory Visit: Payer: Self-pay | Admitting: Internal Medicine

## 2014-09-28 ENCOUNTER — Ambulatory Visit: Payer: Medicare Other

## 2014-09-28 ENCOUNTER — Ambulatory Visit (HOSPITAL_COMMUNITY)
Admission: RE | Admit: 2014-09-28 | Discharge: 2014-09-28 | Disposition: A | Discharge status: Home / Self Care | Payer: Medicare Other | Source: Ambulatory Visit | Attending: Internal Medicine | Admitting: Internal Medicine

## 2014-09-28 ENCOUNTER — Other Ambulatory Visit: Payer: Self-pay

## 2014-09-28 ENCOUNTER — Other Ambulatory Visit (INDEPENDENT_AMBULATORY_CARE_PROVIDER_SITE_OTHER): Payer: Medicare Other

## 2014-09-28 ENCOUNTER — Encounter: Payer: Self-pay | Admitting: Internal Medicine

## 2014-09-28 ENCOUNTER — Encounter: Payer: Self-pay | Admitting: Licensed Clinical Social Worker

## 2014-09-28 VITALS — BP 151/72 | HR 80 | Temp 97.5°F | Ht 74.0 in | Wt 160.1 lb

## 2014-09-28 DIAGNOSIS — I10 Essential (primary) hypertension: Secondary | ICD-10-CM | POA: Diagnosis not present

## 2014-09-28 DIAGNOSIS — N183 Chronic kidney disease, stage 3 unspecified: Secondary | ICD-10-CM

## 2014-09-28 DIAGNOSIS — E875 Hyperkalemia: Secondary | ICD-10-CM

## 2014-09-28 DIAGNOSIS — I208 Other forms of angina pectoris: Secondary | ICD-10-CM | POA: Diagnosis not present

## 2014-09-28 DIAGNOSIS — R9431 Abnormal electrocardiogram [ECG] [EKG]: Secondary | ICD-10-CM | POA: Diagnosis not present

## 2014-09-28 DIAGNOSIS — I517 Cardiomegaly: Secondary | ICD-10-CM | POA: Diagnosis not present

## 2014-09-28 LAB — BASIC METABOLIC PANEL WITH GFR
BUN: 49 mg/dL — ABNORMAL HIGH (ref 6–23)
CHLORIDE: 111 meq/L (ref 96–112)
CO2: 21 meq/L (ref 19–32)
Calcium: 9.2 mg/dL (ref 8.4–10.5)
Creat: 1.8 mg/dL — ABNORMAL HIGH (ref 0.50–1.35)
GFR, Est African American: 42 mL/min — ABNORMAL LOW
GFR, Est Non African American: 36 mL/min — ABNORMAL LOW
GLUCOSE: 90 mg/dL (ref 70–99)
Potassium: 5.6 mEq/L — ABNORMAL HIGH (ref 3.5–5.3)
SODIUM: 144 meq/L (ref 135–145)

## 2014-09-28 MED ORDER — FUROSEMIDE 20 MG PO TABS: 20.0000 mg | ORAL_TABLET | Freq: Once | ORAL | Status: DC

## 2014-09-28 MED ORDER — ALBUTEROL SULFATE (2.5 MG/3ML) 0.083% IN NEBU
2.5000 mg | INHALATION_SOLUTION | Freq: Once | RESPIRATORY_TRACT | Status: AC
  Administered 2014-09-28: 2.5 mg via RESPIRATORY_TRACT

## 2014-09-28 MED ORDER — FUROSEMIDE 40 MG PO TABS
20.0000 mg | ORAL_TABLET | Freq: Once | ORAL | Status: AC
  Administered 2014-09-28: 20 mg via ORAL

## 2014-09-28 MED ORDER — SODIUM POLYSTYRENE SULFONATE 15 GM/60ML PO SUSP
30.0000 g | Freq: Once | ORAL | Status: AC
  Administered 2014-09-28: 30 g via ORAL

## 2014-09-28 NOTE — Progress Notes (Signed)
Patient ID: Derrick Hendrix, male   DOB: January 12, 1941, 73 y.o.   MRN: [id number] This CSW received email regarding status update from [place], Raina Mina:  Raina Mina  09/15/2014   I visited today for the initial [place]. In process senior cell phone, mobile meals, M[employer] application, [employer] services for transportation and medication procurement (I filled his pill box and verified all medications with a review via updated list in EPIC while on the visit today). He has an understanding and only pays $3.99 each for all 6 bottles of his medications. Has neighbor who is medically inclined to proceed with regular refills and picks up of his medications from [employer]. Not quite ready to stop smoking but aware we have a program via [employer]. Rodman Key and Di Kindle ([facility] SW team) are aware covering of this patient's needs as I reported all details with such request to intervene. Will send PCP HV report when documentation finished next week (Monday).

## 2014-09-28 NOTE — Patient Instructions (Signed)
General Instructions:  1. Please schedule a follow up appointment for 1-2 weeks.  2. Please take all medications as prescribed.   3. If you have worsening of your symptoms or new symptoms arise, please call the clinic [phone]), or go to the ER immediately if symptoms are severe.    Please bring your medicines with you each time you come to clinic.  Medicines may include prescription medications, over-the-counter medications, herbal remedies, eye drops, vitamins, or other pills.   Progress Toward Treatment Goals:  Treatment Goal 09/27/2014  Blood pressure at goal  Stop smoking smoking the same amount    Self Care Goals & Plans:  Self Care Goal 09/27/2014  Manage my medications take my medicines as prescribed; bring my medications to every visit; refill my medications on time  Eat healthy foods eat more vegetables; eat foods that are low in salt; eat baked foods instead of fried foods  Be physically active -  Stop smoking (No Data)       Hyperkalemia Hyperkalemia is when you have too much potassium in your blood. This can be a life-threatening condition. Potassium is normally removed (excreted) from the body by the kidneys. CAUSES  The potassium level in your body can become too high for the following reasons:  You take in too much potassium. You can do this by:  Using salt substitutes. They contain large amounts of potassium.  Taking potassium supplements from your caregiver. The dose may be too high for you.  Eating foods or taking nutritional products with potassium.  You excrete too little potassium. This can happen if:  Your kidneys are not functioning properly. Kidney (renal) disease is a very common cause of hyperkalemia.  You are taking medicines that lower your excretion of potassium, such as certain diuretic medicines.  You have an adrenal gland disease called Addison's disease.  You have a urinary tract obstruction, such as kidney stones.  You are on  treatment to mechanically clean your blood (dialysis) and you skip a treatment.  You release a high amount of potassium from your cells into your blood. You may have a condition that causes potassium to move from your cells to your bloodstream. This can happen with:  Injury to muscles or other tissues. Most potassium is stored in the muscles.  Severe burns or infections.  Acidic blood plasma (acidosis). Acidosis can result from many diseases, such as uncontrolled diabetes. SYMPTOMS  Usually, there are no symptoms unless the potassium is dangerously high or has risen very quickly. Symptoms may include:  Irregular or very slow heartbeat.  Feeling sick to your stomach (nauseous).  Tiredness (fatigue).  Nerve problems such as tingling of the skin, numbness of the hands or feet, weakness, or paralysis. DIAGNOSIS  A simple blood test can measure the amount of potassium in your body. An electrocardiogram test of the heart can also help make the diagnosis. The heart may beat dangerously fast or slow down and stop beating with severe hyperkalemia.  TREATMENT  Treatment depends on how bad the condition is and on the underlying cause.  If the hyperkalemia is an emergency (causing heart problems or paralysis), many different medicines can be used alone or together to lower the potassium level briefly. This may include an insulin injection even if you are not diabetic. Emergency dialysis may be needed to remove potassium from the body.  If the hyperkalemia is less severe or dangerous, the underlying cause is treated. This can include taking medicines if needed. Your prescription medicines  may be changed. You may also need to take a medicine to help your body get rid of potassium. You may need to eat a diet low in potassium. HOME CARE INSTRUCTIONS   Take medicines and supplements as directed by your caregiver.  Do not take any over-the-counter medicines, supplements, natural products, herbs, or  vitamins without reviewing them with your caregiver. Certain supplements and natural food products can have high amounts of potassium. Other products (such as ibuprofen) can damage weak kidneys and raise your potassium.  You may be asked to do repeat lab tests. Be sure to follow these directions.  If you have kidney disease, you may need to follow a low potassium diet. SEEK MEDICAL CARE IF:   You notice an irregular or very slow heartbeat.  You feel lightheaded.  You develop weakness that is unusual for you. SEEK IMMEDIATE MEDICAL CARE IF:   You have shortness of breath.  You have chest discomfort.  You pass out (faint). MAKE SURE YOU:   Understand these instructions.  Will watch your condition.  Will get help right away if you are not doing well or get worse. Document Released: 11/21/2002 Document Revised: 02/23/2012 Document Reviewed: 03/08/2014 [facility] Patient Information 2015 [place], [place]. This information is not intended to replace advice given to you by your health care provider. Make sure you discuss any questions you have with your health care provider.

## 2014-09-28 NOTE — Progress Notes (Signed)
Subjective:   Patient ID: Derrick Hendrix male   DOB: 24-Mar-1941 73 y.o.   MRN: [id number]  HPI: Derrick Hendrix is a 73 y.o. male w/ PMHx of HTN, h/o CVA, and CKD stage 3, presents to the clinic today for a follow-up visit for hyperkalemia. The patient was seen in the clinic on 09/27/14 and had labs drawn, BMP showing K of 6.3. He was therefore called back to the clinic today for repeat labs, EKG and management. The patient denies any symptoms of muscle aches, fatigue, palpitations, nausea, chest pain, or SOB. 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 He was previously on Florinef but was unable to continue taking this 2/2 cost. He is now on Lasix 20 mg daily, but is questionably compliant w/ his medications. He has been told many times (according to notes) about low potassium diet but it is also unlikely that he has kept to this. He does admit to using "chicken seasoning" frequently, but denies eating other high potassium foods.   EKG today shows NSR w/ LVH, early repolarization, and questionable peaked T-waves in V2, however, this looks generally the same when compared to previous EKG's.   Patient was given Lasix 20 mg po once, albuterol neb, and Kayexalate 30 g once. Repeat BMP (drawn prior to management, resulted after management) showed K of 5.6.    Past Medical History  Diagnosis Date  . Hypertension   . Headache(784.0)   . Hyperkalemia 08/2014  . Stroke     hx of PAD  . Chronic kidney disease     HX OF CKD 3   Current Outpatient Prescriptions  Medication Sig Dispense Refill  . amLODipine (NORVASC) 5 MG tablet Take 1 tablet (5 mg total) by mouth daily.  30 tablet  11  . carvedilol (COREG) 6.25 MG tablet Take 1 tablet (6.25 mg total) by mouth 2 (two) times daily with a meal.  60 tablet  11  . cilostazol (PLETAL) 100 MG tablet Take 1 tablet (100 mg total) by mouth 2  (two) times daily.  60 tablet  3  . furosemide (LASIX) 40 MG tablet Take 0.5 tablets (20 mg total) by mouth daily.  30 tablet  11  . nitroGLYCERIN (NITROSTAT) 0.4 MG SL tablet Place 1 tablet (0.4 mg total) under the tongue every 5 (five) minutes x 3 doses as needed for chest pain.  30 tablet  3  . simvastatin (ZOCOR) 20 MG tablet Take 1 tablet (20 mg total) by mouth daily.  30 tablet  11  . terazosin (HYTRIN) 1 MG capsule Take 1 capsule (1 mg total) by mouth at bedtime.  30 capsule  11   No current facility-administered medications for this visit.    Review of Systems: General: Denies fever, chills, diaphoresis, appetite change and fatigue.  Respiratory: Denies SOB, DOE, cough, and wheezing.   Cardiovascular: Denies chest pain and palpitations.  Gastrointestinal: Denies nausea, vomiting, abdominal pain, and diarrhea.  Genitourinary: Denies dysuria, increased frequency, and flank pain. Endocrine: Denies hot or cold intolerance, polyuria, and polydipsia. Musculoskeletal: Denies myalgias, back pain, joint swelling, arthralgias and gait problem.  Skin: Denies pallor, rash and wounds.  Neurological: Denies dizziness, seizures, syncope, weakness, lightheadedness, numbness and headaches.  Psychiatric/Behavioral: Denies mood changes, and sleep disturbances.  Objective:   Physical Exam: Filed Vitals:   09/28/14 1128  BP: 151/72  Pulse: 80  Temp: 97.5 F (36.4 C)  TempSrc: Oral  Height: 6\' 2"  (1.88 m)  Weight: 160 lb 1.6 oz (72.621 kg)  SpO2: 100%    General: Elderly AA male, alert, cooperative, NAD. HEENT: PERRL, EOMI. Moist mucus membranes. Poor dentition.  Neck: Full range of motion without pain, supple, no lymphadenopathy or carotid bruits Lungs: Clear to ascultation bilaterally, normal work of respiration, no wheezes, rales, rhonchi Heart: RRR, no murmurs, gallops, or rubs Abdomen: Soft, non-tender, non-distended, BS + Extremities: No cyanosis, clubbing, or edema Neurologic:  Alert & oriented X3, cranial nerves II-XII intact, strength grossly intact, sensation intact to light touch   Assessment & Plan:   Please see problem based assessment and plan.

## 2014-09-28 NOTE — Telephone Encounter (Signed)
Talked to his next door neighbor who told me that she had actually already talked to Derrick Hendrix to come in this morning. She left him dressing up to come. We will get a BMET today when he comes.

## 2014-09-29 ENCOUNTER — Encounter: Payer: Self-pay | Admitting: Internal Medicine

## 2014-09-29 NOTE — Assessment & Plan Note (Addendum)
Cr elevated from baseline to 1.83 yesterday, now slightly improved to 1.80. This small bump may be cause of recent increase in Cr as he may have some mild pre-renal AKI. He denies recent decreased po intake, vomiting, or diarrhea however. Has a known h/o BPH, however, he denies any significant symptoms different from his baseline.  -Would repeat BMP at next office visit.  -Would also likely benefit from renal referral (if hasn't seen already) if Cr continues to increase.

## 2014-09-29 NOTE — Assessment & Plan Note (Signed)
During visit on 09/27/14, K found to be 6.3. Patient was called back to clinic today. Asymptomatic.  -EKG showed NSR w/ LVH, early repolarization, and questionable peaked T-waves in V2, however, this looks generally the same when compared to previous EKG's -Given Lasix 20 mg po (home dose), albuterol neb treatment, and Kayexalate 30 g once.  -Repeat BMP showed K of 5.6 (drawn prior to management listed as above), patient allowed to go home w/ follow up in 1-2 weeks.  -Emphasized low K diet, compliance w/ medications, and close follow up.  -Patient also being follow by [facility], closely cared for by his neighbor who fills his meds and picks up refills.

## 2014-09-29 NOTE — Progress Notes (Signed)
INTERNAL MEDICINE TEACHING ATTENDING ADDENDUM - Trevor Wilkie, MD: I reviewed and discussed at the time of visit with the resident Dr. Kazibwe, the patient's medical history, physical examination, diagnosis and results of pertinent tests and treatment and I agree with the patient's care as documented.  

## 2014-10-02 NOTE — Progress Notes (Signed)
Case discussed with Dr. Ronnald Ramp soon after the resident saw the patient. We reviewed the resident's history and exam and pertinent patient test results. I agree with the assessment, diagnosis, and plan of care documented in the resident's note.

## 2014-10-05 ENCOUNTER — Ambulatory Visit: Payer: Medicare Other | Admitting: Internal Medicine

## 2014-10-12 ENCOUNTER — Ambulatory Visit: Payer: Medicare Other | Admitting: Internal Medicine

## 2014-10-16 ENCOUNTER — Ambulatory Visit: Payer: Medicare Other | Admitting: Internal Medicine

## 2014-10-16 NOTE — Addendum Note (Signed)
Addended by: Hulan Fray on: 10/16/2014 07:47 PM   Modules accepted: Orders

## 2014-10-20 ENCOUNTER — Encounter: Payer: Self-pay | Admitting: Internal Medicine

## 2014-10-20 ENCOUNTER — Observation Stay (HOSPITAL_COMMUNITY)
Admission: AD | Admit: 2014-10-20 | Discharge: 2014-10-21 | Disposition: A | Discharge status: Home / Self Care | Payer: Medicare Other | Source: Ambulatory Visit | Attending: Infectious Diseases | Admitting: Infectious Diseases

## 2014-10-20 ENCOUNTER — Ambulatory Visit (INDEPENDENT_AMBULATORY_CARE_PROVIDER_SITE_OTHER): Payer: Medicare Other | Admitting: Internal Medicine

## 2014-10-20 VITALS — BP 123/68 | HR 85 | Temp 98.0°F | Ht 74.0 in | Wt 164.6 lb

## 2014-10-20 DIAGNOSIS — I129 Hypertensive chronic kidney disease with stage 1 through stage 4 chronic kidney disease, or unspecified chronic kidney disease: Secondary | ICD-10-CM | POA: Insufficient documentation

## 2014-10-20 DIAGNOSIS — I10 Essential (primary) hypertension: Secondary | ICD-10-CM | POA: Diagnosis not present

## 2014-10-20 DIAGNOSIS — F17210 Nicotine dependence, cigarettes, uncomplicated: Secondary | ICD-10-CM | POA: Diagnosis not present

## 2014-10-20 DIAGNOSIS — D649 Anemia, unspecified: Secondary | ICD-10-CM | POA: Diagnosis not present

## 2014-10-20 DIAGNOSIS — E875 Hyperkalemia: Secondary | ICD-10-CM

## 2014-10-20 DIAGNOSIS — I208 Other forms of angina pectoris: Secondary | ICD-10-CM

## 2014-10-20 DIAGNOSIS — N183 Chronic kidney disease, stage 3 unspecified: Secondary | ICD-10-CM

## 2014-10-20 DIAGNOSIS — I739 Peripheral vascular disease, unspecified: Secondary | ICD-10-CM | POA: Diagnosis not present

## 2014-10-20 DIAGNOSIS — E2749 Other adrenocortical insufficiency: Secondary | ICD-10-CM | POA: Insufficient documentation

## 2014-10-20 LAB — CBC WITH DIFFERENTIAL/PLATELET
BASOS PCT: 1 % (ref 0–1)
Basophils Absolute: 0 10*3/uL (ref 0.0–0.1)
EOS ABS: 0.4 10*3/uL (ref 0.0–0.7)
Eosinophils Relative: 8 % — ABNORMAL HIGH (ref 0–5)
HEMATOCRIT: 34.9 % — AB (ref 39.0–52.0)
Hemoglobin: 11.4 g/dL — ABNORMAL LOW (ref 13.0–17.0)
Lymphocytes Relative: 24 % (ref 12–46)
Lymphs Abs: 1.1 10*3/uL (ref 0.7–4.0)
MCH: 31 pg (ref 26.0–34.0)
MCHC: 32.7 g/dL (ref 30.0–36.0)
MCV: 94.8 fL (ref 78.0–100.0)
MONO ABS: 0.4 10*3/uL (ref 0.1–1.0)
MONOS PCT: 8 % (ref 3–12)
Neutro Abs: 2.7 10*3/uL (ref 1.7–7.7)
Neutrophils Relative %: 59 % (ref 43–77)
Platelets: 294 10*3/uL (ref 150–400)
RBC: 3.68 MIL/uL — ABNORMAL LOW (ref 4.22–5.81)
RDW: 14.7 % (ref 11.5–15.5)
WBC: 4.5 10*3/uL (ref 4.0–10.5)

## 2014-10-20 LAB — BASIC METABOLIC PANEL WITH GFR
BUN: 44 mg/dL — ABNORMAL HIGH (ref 6–23)
CO2: 22 mEq/L (ref 19–32)
Calcium: 9.3 mg/dL (ref 8.4–10.5)
Chloride: 108 mEq/L (ref 96–112)
Creat: 1.75 mg/dL — ABNORMAL HIGH (ref 0.50–1.35)
GFR, Est African American: 43 mL/min — ABNORMAL LOW
GFR, Est Non African American: 37 mL/min — ABNORMAL LOW
Glucose, Bld: 91 mg/dL (ref 70–99)
POTASSIUM: 7.3 meq/L — AB (ref 3.5–5.3)
Sodium: 142 mEq/L (ref 135–145)

## 2014-10-20 LAB — BASIC METABOLIC PANEL
ANION GAP: 14 (ref 5–15)
BUN: 44 mg/dL — AB (ref 6–23)
CO2: 18 meq/L — AB (ref 19–32)
CREATININE: 1.7 mg/dL — AB (ref 0.50–1.35)
Calcium: 8.5 mg/dL (ref 8.4–10.5)
Chloride: 106 mEq/L (ref 96–112)
GFR calc Af Amer: 44 mL/min — ABNORMAL LOW (ref >90)
GFR calc non Af Amer: 38 mL/min — ABNORMAL LOW (ref >90)
GLUCOSE: 131 mg/dL — AB (ref 70–99)
Potassium: 4.7 mEq/L (ref 3.7–5.3)
Sodium: 138 mEq/L (ref 137–147)

## 2014-10-20 LAB — IRON AND TIBC
%SAT: 44 % (ref 20–55)
Iron: 135 ug/dL (ref 42–165)
TIBC: 306 ug/dL (ref 215–435)
UIBC: 171 ug/dL (ref 125–400)

## 2014-10-20 LAB — TROPONIN I
Troponin I: <0.3 ng/mL (ref <0.30)
Troponin I: <0.3 ng/mL (ref <0.30)

## 2014-10-20 LAB — PHOSPHORUS: PHOSPHORUS: 4.1 mg/dL (ref 2.3–4.6)

## 2014-10-20 LAB — MAGNESIUM: Magnesium: 2 mg/dL (ref 1.5–2.5)

## 2014-10-20 LAB — FERRITIN: FERRITIN: 365 ng/mL — AB (ref 22–322)

## 2014-10-20 MED ORDER — TERAZOSIN HCL 1 MG PO CAPS
1.0000 mg | ORAL_CAPSULE | Freq: Every day | ORAL | Status: DC
  Administered 2014-10-20: 1 mg via ORAL
  Filled 2014-10-20 (×2): qty 1

## 2014-10-20 MED ORDER — FUROSEMIDE 20 MG PO TABS
20.0000 mg | ORAL_TABLET | Freq: Every day | ORAL | Status: DC
  Administered 2014-10-21: 20 mg via ORAL
  Filled 2014-10-20: qty 1

## 2014-10-20 MED ORDER — SODIUM CHLORIDE 0.9 % IV SOLN
1.0000 g | Freq: Once | INTRAVENOUS | Status: AC
  Administered 2014-10-20: 1 g via INTRAVENOUS
  Filled 2014-10-20: qty 10

## 2014-10-20 MED ORDER — CILOSTAZOL 100 MG PO TABS
100.0000 mg | ORAL_TABLET | Freq: Two times a day (BID) | ORAL | Status: DC
Start: 2014-10-21 — End: 2014-10-21
  Administered 2014-10-21: 100 mg via ORAL
  Filled 2014-10-20 (×2): qty 1

## 2014-10-20 MED ORDER — INSULIN ASPART 100 UNIT/ML IV SOLN
10.0000 [IU] | Freq: Once | INTRAVENOUS | Status: AC
  Administered 2014-10-20: 10 [IU] via INTRAVENOUS
  Filled 2014-10-20: qty 0.1

## 2014-10-20 MED ORDER — DEXTROSE 50 % IV SOLN
1.0000 | Freq: Once | INTRAVENOUS | Status: AC
  Administered 2014-10-20: 50 mL via INTRAVENOUS

## 2014-10-20 MED ORDER — AMLODIPINE BESYLATE 5 MG PO TABS
5.0000 mg | ORAL_TABLET | Freq: Every day | ORAL | Status: DC
  Administered 2014-10-21: 5 mg via ORAL
  Filled 2014-10-20: qty 1

## 2014-10-20 MED ORDER — SODIUM CHLORIDE 0.9 % IJ SOLN
3.0000 mL | Freq: Two times a day (BID) | INTRAMUSCULAR | Status: DC
  Administered 2014-10-20 (×2): 3 mL via INTRAVENOUS

## 2014-10-20 MED ORDER — SIMVASTATIN 20 MG PO TABS
20.0000 mg | ORAL_TABLET | Freq: Every day | ORAL | Status: DC
  Administered 2014-10-20 – 2014-10-21 (×2): 20 mg via ORAL
  Filled 2014-10-20 (×2): qty 1

## 2014-10-20 MED ORDER — HYDRALAZINE HCL 20 MG/ML IJ SOLN: 10.0000 mg | Freq: Three times a day (TID) | INTRAMUSCULAR | Status: DC | PRN

## 2014-10-20 MED ORDER — SODIUM POLYSTYRENE SULFONATE 15 GM/60ML PO SUSP
30.0000 g | Freq: Once | ORAL | Status: AC
  Administered 2014-10-20: 30 g via ORAL
  Filled 2014-10-20: qty 120

## 2014-10-20 MED ORDER — DEXTROSE 50 % IV SOLN
INTRAVENOUS | Status: AC
  Filled 2014-10-20: qty 50

## 2014-10-20 MED ORDER — HEPARIN SODIUM (PORCINE) 5000 UNIT/ML IJ SOLN
5000.0000 [IU] | Freq: Three times a day (TID) | INTRAMUSCULAR | Status: DC
  Administered 2014-10-20 – 2014-10-21 (×3): 5000 [IU] via SUBCUTANEOUS
  Filled 2014-10-20 (×5): qty 1

## 2014-10-20 MED ORDER — ALBUTEROL SULFATE (2.5 MG/3ML) 0.083% IN NEBU
10.0000 mg | INHALATION_SOLUTION | Freq: Once | RESPIRATORY_TRACT | Status: AC
  Administered 2014-10-20: 10 mg via RESPIRATORY_TRACT
  Filled 2014-10-20: qty 12

## 2014-10-20 MED ORDER — SODIUM CHLORIDE 0.9 % IV BOLUS (SEPSIS)
500.0000 mL | Freq: Once | INTRAVENOUS | Status: AC
  Administered 2014-10-20: 500 mL via INTRAVENOUS

## 2014-10-20 NOTE — Progress Notes (Addendum)
Subjective:   Patient ID: Derrick Hendrix male   DOB: [birth year] 73 y.o.   MRN: [id number]  HPI: [name] is a 73 y.o. male with HTN and CKD 3 presenting to opc this morning for routine follow up visit.   Hyperkalemia--last K 5.6 10/15. Hx of hyporenin hypoaldosteronism, cannot afford florinief. Admits to missing some doses of his medications as evident by his pill box today, however, he usually takes 20mg  of lasix in the afternoon and none of those doses appear to be missed.  He denies, chest pain, sob, cramps, headache, abdominal pain, fever, or chills. Does feel like he is getting a cold with runny nose and has chronic morning stiffness mainly in neck.   Works as a Development worker, [employer].   HTN--well controlled. On CCB, Lasix, and BB  Past Medical History  Diagnosis Date  . Hypertension   . Headache(784.0)   . Hyperkalemia 08/2014  . Stroke     hx of PAD  . Chronic kidney disease     HX OF CKD 3   Current Outpatient Prescriptions  Medication Sig Dispense Refill  . amLODipine (NORVASC) 5 MG tablet Take 1 tablet (5 mg total) by mouth daily. 30 tablet 11  . carvedilol (COREG) 6.25 MG tablet Take 1 tablet (6.25 mg total) by mouth 2 (two) times daily with a meal. 60 tablet 11  . cilostazol (PLETAL) 100 MG tablet Take 1 tablet (100 mg total) by mouth 2 (two) times daily. 60 tablet 3  . furosemide (LASIX) 40 MG tablet Take 0.5 tablets (20 mg total) by mouth daily. 30 tablet 11  . nitroGLYCERIN (NITROSTAT) 0.4 MG SL tablet Place 1 tablet (0.4 mg total) under the tongue every 5 (five) minutes x 3 doses as needed for chest pain. 30 tablet 3  . simvastatin (ZOCOR) 20 MG tablet Take 1 tablet (20 mg total) by mouth daily. 30 tablet 11  . terazosin (HYTRIN) 1 MG capsule Take 1 capsule (1 mg total) by mouth at bedtime. 30 capsule 11   No current facility-administered medications for this visit.   Family History  Problem Relation Age of Onset  . Hypertension Mother     died age 110  .  Diabetes Mother    History   Social History  . Marital Status: Single    Spouse Name: N/A    Number of Children: N/A  . Years of Education: N/A   Social History Main Topics  . Smoking status: Current Every Day Smoker -- 1.00 packs/day for 40 years    Types: Cigarettes  . Smokeless tobacco: Never Used     Comment: but hoping to quit one day  1/2PPD  . Alcohol Use: 5.4 oz/week    9 Cans of beer per week     Comment: Liquor once a week.  . Drug Use: No  . Sexual Activity: None   Other Topics Concern  . None   Social History Narrative   Works in the yard         Review of Systems:  Constitutional:  Denies fever, chills  HEENT:  Occasional neck stiffness, mainly in mornings. Runny nose  Respiratory:  Denies SOB  Cardiovascular:  Denies chest pain  Gastrointestinal:  Denies nausea, vomiting, abdominal pain  Genitourinary:  Denies dysuria  Musculoskeletal:  Denies gait problem.   Skin:  Denies pallor, rash and wound.   Neurological:  Denies headaches.    Objective:  Physical Exam: Filed Vitals:   10/20/14 0919  BP: 123/68  Pulse:  85  Temp: 98 F (36.7 C)  TempSrc: Oral  Height: 6\' 2"  (1.88 m)  Weight: 164 lb 9.6 oz (74.662 kg)  SpO2: 100%   Vitals reviewed. General: sitting in chair, NAD HEENT: EOMI Cardiac: RRR Pulm: clear to auscultation bilaterally, no wheezes, rales, or rhonchi Abd: soft, BS present Ext: moving all extremities Neuro: alert and oriented X3, strength and sensation to light touch equal in bilateral upper and lower extremities  EKG: 83bpm, NSR, peaked t waves more in v2 and v3 compared to prior readings, some early repolarization likely as well  Assessment & Plan:  Discussed with Dr. Dareen Piano

## 2014-10-20 NOTE — H&P (Signed)
Date: 10/20/2014               Patient Name:  Derrick Hendrix MRN: [id number]  DOB: 1940-12-30 Age / Sex: 73 y.o., male   PCP: Luan Moore, MD         Medical Service: Internal Medicine Teaching Service         Attending Physician: Dr. Campbell Riches, MD    First Contact: Dr. Genene Churn Pager: [phone]  Second Contact: Dr. Ronnald Ramp Pager: [phone]       After Hours (After 5p/  First Contact Pager: [phone]  weekends / holidays): Second Contact Pager: [phone]   Chief Complaint: hyperkalemia  History of Present Illness:   73 yo male with HTN, hyporenimic hypoaldosteronism, CKD 3 presented to the clinic for regular follow up. Found to have hyperkalemia on labs 7.3.last was 5.6 on 09/28/14. Admitted for hyperkalemia management.   Has been feeling occassional dizziness, tired, and occasional palpitation last 2 days ago. Wasn't able to afford florinief. Recently got medicare. Has been compliant with all of his meds including 20mg  lasix daily. States he has been careful about his diet but did mention eating spinach in moderation. Denies any fever/chills/n/v/dysuria/sob/chest pain. Feeling fine currently.   Meds: Current Facility-Administered Medications  Medication Dose Route Frequency Provider Last Rate Last Dose  . albuterol (PROVENTIL) (2.5 MG/3ML) 0.083% nebulizer solution 10 mg  10 mg Nebulization Once Corky Sox, MD      . Derrill Memo ON 10/21/2014] amLODipine (NORVASC) tablet 5 mg  5 mg Oral Daily Corky Sox, MD      . calcium gluconate 1 g in sodium chloride 0.9 % 100 mL IVPB  1 g Intravenous Once Corky Sox, MD      . Derrill Memo ON 10/21/2014] cilostazol (PLETAL) tablet 100 mg  100 mg Oral BID Corky Sox, MD      . dextrose 50 % solution 50 mL  1 ampule Intravenous Once Corky Sox, MD      . heparin injection 5,000 Units  5,000 Units Subcutaneous 3 times per day Corky Sox, MD      . insulin aspart (novoLOG) injection 10 Units  10 Units Intravenous Once Corky Sox, MD      .  simvastatin (ZOCOR) tablet 20 mg  20 mg Oral Daily Corky Sox, MD      . sodium chloride 0.9 % bolus 500 mL  500 mL Intravenous Once Corky Sox, MD      . sodium chloride 0.9 % injection 3 mL  3 mL Intravenous Q12H Corky Sox, MD      . sodium polystyrene (KAYEXALATE) 15 GM/60ML suspension 30 g  30 g Oral Once Wilber Oliphant, MD        Allergies: Allergies as of 10/20/2014 - Review Complete 10/20/2014  Allergen Reaction Noted  . Aspirin  08/24/2014   Past Medical History  Diagnosis Date  . Hypertension   . Headache(784.0)   . Hyperkalemia 08/2014  . Stroke     hx of PAD  . Chronic kidney disease     HX OF CKD 3   No past surgical history on file. Family History  Problem Relation Age of Onset  . Hypertension Mother     died age 66  . Diabetes Mother    History   Social History  . Marital Status: Single    Spouse Name: N/A    Number of Children: N/A  . Years of Education: N/A  Occupational History  . Not on file.   Social History Main Topics  . Smoking status: Current Every Day Smoker -- 1.00 packs/day for 40 years    Types: Cigarettes  . Smokeless tobacco: Never Used     Comment: but hoping to quit one day  1/2PPD  . Alcohol Use: 5.4 oz/week    9 Cans of beer per week     Comment: Liquor once a week.  . Drug Use: No  . Sexual Activity: Not on file   Other Topics Concern  . Not on file   Social History Narrative   Works in the yard          Review of Systems: Review of Systems  Constitutional: Positive for malaise/fatigue. Negative for fever, chills, weight loss and diaphoresis.  HENT: Negative.   Eyes: Negative for double vision, photophobia, discharge and redness.  Cardiovascular: Positive for palpitations.  Gastrointestinal: Negative for heartburn, nausea, vomiting, abdominal pain, diarrhea, constipation, blood in stool and melena.  Genitourinary: Negative.   Musculoskeletal: Negative.   Skin: Negative.   Neurological: Positive for  dizziness. Negative for tremors, sensory change, speech change, focal weakness, seizures, loss of consciousness and weakness.  Endo/Heme/Allergies: Negative.   Psychiatric/Behavioral: Negative.     Physical Exam: There were no vitals taken for this visit. Physical Exam  Constitutional: He is oriented to person, place, and time. He appears well-developed and well-nourished. No distress.  HENT:  Head: Normocephalic and atraumatic.  Right Ear: External ear normal.  Left Ear: External ear normal.  Nose: Nose normal.  Mouth/Throat: No oropharyngeal exudate.  Eyes: Conjunctivae and EOM are normal. Pupils are equal, round, and reactive to light. Right eye exhibits no discharge. Left eye exhibits no discharge. No scleral icterus.  Neck: Normal range of motion. Neck supple. No JVD present. No tracheal deviation present.  Cardiovascular: Normal rate, regular rhythm and normal heart sounds.  Exam reveals no gallop and no friction rub.   No murmur heard. Respiratory: Effort normal and breath sounds normal. No stridor. No respiratory distress. He has no wheezes. He has no rales. He exhibits no tenderness.  GI: Soft. Bowel sounds are normal. He exhibits no distension and no mass. There is no tenderness. There is no rebound and no guarding.  Musculoskeletal: Normal range of motion. He exhibits no edema or tenderness.  Neurological: He is alert and oriented to person, place, and time. No cranial nerve deficit. Coordination normal.  Skin: He is not diaphoretic.  Psychiatric: He has a normal mood and affect.     Lab results: Basic Metabolic Panel:  Recent Labs  10/20/14 0935  NA 142  K 7.3*  CL 108  CO2 22  GLUCOSE 91  BUN 44*  CREATININE 1.75*  CALCIUM 9.3   Liver Function Tests: No results for input(s): AST, ALT, ALKPHOS, BILITOT, PROT, ALBUMIN in the last 72 hours. No results for input(s): LIPASE, AMYLASE in the last 72 hours. No results for input(s): AMMONIA in the last 72  hours. CBC:  Recent Labs  10/20/14 0935  WBC 4.5  NEUTROABS 2.7  HGB 11.4*  HCT 34.9*  MCV 94.8  PLT 294   Cardiac Enzymes: No results for input(s): CKTOTAL, CKMB, CKMBINDEX, TROPONINI in the last 72 hours. BNP: No results for input(s): PROBNP in the last 72 hours. D-Dimer: No results for input(s): DDIMER in the last 72 hours. CBG: No results for input(s): GLUCAP in the last 72 hours. Hemoglobin A1C: No results for input(s): HGBA1C in the last 72  hours. Fasting Lipid Panel: No results for input(s): CHOL, HDL, LDLCALC, TRIG, CHOLHDL, LDLDIRECT in the last 72 hours. Thyroid Function Tests: No results for input(s): TSH, T4TOTAL, FREET4, T3FREE, THYROIDAB in the last 72 hours. Anemia Panel: No results for input(s): VITAMINB12, FOLATE, FERRITIN, TIBC, IRON, RETICCTPCT in the last 72 hours. Coagulation: No results for input(s): LABPROT, INR in the last 72 hours. Urine Drug Screen: Drugs of Abuse     Component Value Date/Time   LABOPIA NONE DETECTED 06/26/2014 1552   COCAINSCRNUR NONE DETECTED 06/26/2014 1552   LABBENZ NONE DETECTED 06/26/2014 1552   AMPHETMU NONE DETECTED 06/26/2014 1552   THCU NONE DETECTED 06/26/2014 1552   LABBARB NONE DETECTED 06/26/2014 1552    Alcohol Level: No results for input(s): ETH in the last 72 hours. Urinalysis: No results for input(s): COLORURINE, LABSPEC, PHURINE, GLUCOSEU, HGBUR, BILIRUBINUR, KETONESUR, PROTEINUR, UROBILINOGEN, NITRITE, LEUKOCYTESUR in the last 72 hours.  Invalid input(s): APPERANCEUR Misc. Labs:  Imaging results:  No results found.  Other results: EKG: NSR, V3 peaked T waves new compared to previous.  Assessment & Plan by Problem: Principal Problem:   Hyperkalemia  73 yo male with CKD 3, hyporenimic hypoaldosteronism here with hyperkalemia likely 2/2 .  Hyperkalemia 2/2 to hyporenimic hypoaldosteronism. Was supposed to take florinef and lasix 20mg  daily. Couldn't afford florinef but now has medicare. - has  peaked TW on V3. Will treat hyperkalemia with insulin 10 u + d50 1 amp + 1g calcium gluconate.  - hold beta blocker (coreg) for now in the setting of hyperkalemia. - will repeat EKG - will repeat BMP - will do kayex 30mg . Will likely need this at home daily. - will start on florinef.  - cont lasix 20mg  po daily - will give him instruction to avoid foods that increase K+  HTN - was on coreg, lasix, amlodopine at home - will hold coreg in the setting of hyperkalemia for now. Continue lasix and amlodopine  CKD 3 - baseline around 1.6-1.8. Stable.  - will consult nephrology to set up him outpatient follow up. - renal diet.  PVD - continue cilostazol.  Diet: renal Ppx: heparin.   Dispo: Disposition is deferred at this time, awaiting improvement of current medical problems. Anticipated discharge in approximately 1-2 day(s).   The patient does have a current PCP Luan Moore, MD) and does need an [facility] hospital follow-up appointment after discharge.  The patient does have transportation limitations that hinder transportation to clinic appointments.  Signed: Dellia Nims, MD 10/20/2014, 12:53 PM

## 2014-10-20 NOTE — Assessment & Plan Note (Addendum)
Chronic, Hb has been slowly decreasing but up to 11.4 today from 10 last month. MCV 94.8  -f/u iron panel, checked just in case for baseline in setting of gradual drop in Hb in elderly male -needs colonoscopy if he agrees for screening purposes

## 2014-10-20 NOTE — Plan of Care (Signed)
Problem: Phase I Progression Outcomes Goal: Voiding-avoid urinary catheter unless indicated Outcome: Completed/Met Date Met:  10/20/14     

## 2014-10-20 NOTE — Assessment & Plan Note (Signed)
Cr down to 1.75 today but baseline maybe 1.3. Was 1.8 last month.   Would recommend outpatient renal referral as well given ckd, htn, hypoaldosteronism, and hyperkalemia

## 2014-10-20 NOTE — Assessment & Plan Note (Addendum)
K up to 7.3 today with peaked t waves.   -will admit to imts -will need insulin, glucose, calcium, kayexelate to start and cardiac monitoring

## 2014-10-20 NOTE — Progress Notes (Signed)
Pt admitted to the unit at 1230. Pt mental status is alert and orient x 4. Pt oriented to room, staff, and call bell. Skin is intact. Full assessment charted in [facility]. Call bell within reach. Visitor guidelines reviewed w/ pt and/or family.

## 2014-10-20 NOTE — Assessment & Plan Note (Addendum)
BP Readings from Last 3 Encounters:  10/20/14 123/68  09/28/14 151/72  09/27/14 129/71   Lab Results  Component Value Date   NA 144 09/28/2014   K 5.6* 09/28/2014   CREATININE 1.80* 09/28/2014   Assessment: Blood pressure control: controlled Progress toward BP goal:  at goal Comments: occasionally misses PM dose but lasix dose usually around noon he is able to take  Plan: Medications:  continue current medications lasix 20mg  daily, norvasc 5mg . Recommend holding BB in setting of hyperkalemia Educational resources provided:   Self management tools provided:   Other plans: may consider adjust lasix dose given hyperkalemia but caution with renal function and I do not think he can tolerate much higher dose with no signs of volume overload and worsening renal function. I would recommend re-evaluation of BB as this can certainly contribute to hyperkalemia as well and BP and HR have been well controlled and may be able to do well without BB for now.

## 2014-10-20 NOTE — Progress Notes (Signed)
INTERNAL MEDICINE TEACHING ATTENDING ADDENDUM - Aldine Contes, MD: I reviewed and discussed at the time of visit with the resident Dr. Eula Fried, the patient's medical history, physical examination, diagnosis and results of pertinent tests and treatment and I agree with the patient's care as documented.  -Pt will need to be admitted for severe hyperkalemia and EKG changes - Monitor on telemetry - Will need calcium urgently to help prevent arrhythmias secondary to hyperkalemia - Will attempt to lower potassium aggressively

## 2014-10-20 NOTE — Assessment & Plan Note (Addendum)
Chronic hyperkalemia.  Cannot afford florinef but appears to be compliant with lasix 20mg  daily--this needs to be recheck as he does have medicare and should be covered Recheck stat bmet today--K 7.3 and cr slightly improved to 1.75 but baseline ~1.3 Discussed with Dr. Lorrene Reid on the phone today, he may need to be on kayexelate tid long term if cannot afford florinef Perhaps can try to increase lasix dose, but will likely affect kidneys given that he is not volume overloaded BB can cause hyperkalemia and given well controlled BP and HR, I recommend holding BB for now monitoring his K and BP to see how he does.  Last echo 06/2014: - Left ventricle: The cavity size was normal. Wall thickness was normal. Systolic function was normal. The estimated ejection fraction was in the range of 55% to 60%. Wall motion was normal; there were no regional wall motion abnormalities. Doppler parameters are consistent with abnormal left ventricular relaxation (grade 1 diastolic dysfunction). The E/e&' ratio is between 8-15, suggesting indeterminate LV Filling pressure. - Mitral valve: Mildly thickened leaflets . There was trivial regurgitation. - Pericardium, extracardiac: The pericardium was normal in appearance. There was no pericardial effusion.   -admit to IMTS today for hyperkalemia, will need calcium, insulin, glucose, and kayexelate to start; discussed with Dr. Ronnald Ramp who will place orders -recommend getting CM or SW involved to see if we can get florinef affordable -low K diet (not sure if compliant)

## 2014-10-20 NOTE — Assessment & Plan Note (Addendum)
K 7.3 today with peaked t waves  Admit to IMTS. Patient already has bed, I have ordered kayexelate and called inpatient pharmacy to have that sent down to the floor immediately to be given to patient and have also discussed with IMTS Resident Dr. Ronnald Ramp who will evaluate patient and proceed with further work up of hyperkalemia management including insulin, glucose, and calcium (order placed for calcium as well) Ask for inpatient team and cm/sw to help with possible medication assistance of florinef approval vs. Chronic kayexelate treatment if he can afford it.  -need extensive low K diet counseling, consider nutrition consult

## 2014-10-21 ENCOUNTER — Encounter (HOSPITAL_COMMUNITY): Payer: Self-pay | Admitting: Surgery

## 2014-10-21 DIAGNOSIS — N183 Chronic kidney disease, stage 3 (moderate): Secondary | ICD-10-CM | POA: Diagnosis not present

## 2014-10-21 DIAGNOSIS — I10 Essential (primary) hypertension: Secondary | ICD-10-CM | POA: Insufficient documentation

## 2014-10-21 DIAGNOSIS — E2749 Other adrenocortical insufficiency: Secondary | ICD-10-CM | POA: Diagnosis not present

## 2014-10-21 DIAGNOSIS — I739 Peripheral vascular disease, unspecified: Secondary | ICD-10-CM | POA: Diagnosis not present

## 2014-10-21 DIAGNOSIS — F17210 Nicotine dependence, cigarettes, uncomplicated: Secondary | ICD-10-CM | POA: Diagnosis not present

## 2014-10-21 DIAGNOSIS — I129 Hypertensive chronic kidney disease with stage 1 through stage 4 chronic kidney disease, or unspecified chronic kidney disease: Secondary | ICD-10-CM | POA: Diagnosis not present

## 2014-10-21 DIAGNOSIS — E875 Hyperkalemia: Secondary | ICD-10-CM | POA: Diagnosis not present

## 2014-10-21 LAB — BASIC METABOLIC PANEL
ANION GAP: 15 (ref 5–15)
BUN: 38 mg/dL — ABNORMAL HIGH (ref 6–23)
CO2: 18 meq/L — AB (ref 19–32)
Calcium: 8.5 mg/dL (ref 8.4–10.5)
Chloride: 107 mEq/L (ref 96–112)
Creatinine, Ser: 1.49 mg/dL — ABNORMAL HIGH (ref 0.50–1.35)
GFR calc Af Amer: 51 mL/min — ABNORMAL LOW (ref >90)
GFR calc non Af Amer: 44 mL/min — ABNORMAL LOW (ref >90)
Glucose, Bld: 79 mg/dL (ref 70–99)
POTASSIUM: 4.4 meq/L (ref 3.7–5.3)
SODIUM: 140 meq/L (ref 137–147)

## 2014-10-21 LAB — TROPONIN I

## 2014-10-21 MED ORDER — AMLODIPINE BESYLATE 10 MG PO TABS: 10.0000 mg | ORAL_TABLET | Freq: Every day | ORAL | Status: DC

## 2014-10-21 MED ORDER — FLUDROCORTISONE ACETATE 0.1 MG PO TABS: 0.1000 mg | ORAL_TABLET | Freq: Every day | ORAL | Status: DC

## 2014-10-21 MED ORDER — FLUDROCORTISONE ACETATE 0.1 MG PO TABS
0.1000 mg | ORAL_TABLET | Freq: Every day | ORAL | Status: DC
  Administered 2014-10-21: 0.1 mg via ORAL
  Filled 2014-10-21: qty 1

## 2014-10-21 NOTE — Progress Notes (Signed)
UR completed 

## 2014-10-21 NOTE — Progress Notes (Signed)
Subjective: Doing well. No complaints. Is ready to go home. No chest pain/palpitations. K+ normalized today.   Objective: Vital signs in last 24 hours: Filed Vitals:   10/20/14 1345 10/20/14 2156 10/20/14 2327 10/21/14 0530  BP: 170/88 158/85 150/69 142/73  Pulse: 79 84  75  Temp: 97.8 F (36.6 C) 99.6 F (37.6 C)  98.3 F (36.8 C)  TempSrc: Oral Oral  Oral  Resp: 20 19  17   Height:      SpO2: 100% 99%  100%   Weight change:   Intake/Output Summary (Last 24 hours) at 10/21/14 0928 Last data filed at 10/20/14 2327  Gross per 24 hour  Intake      3 ml  Output      0 ml  Net      3 ml   Vitals reviewed.  General: resting in bed, NAD HEENT: PERRL, EOMI, no scleral icterus Cardiac: RRR, no rubs, murmurs or gallops Pulm: clear to auscultation bilaterally, no wheezes, rales, or rhonchi Abd: soft, nontender, nondistended, BS present Ext: warm and well perfused, no pedal edema Neuro: alert and oriented X3, cranial nerves II-XII grossly intact, strength and sensation to light touch equal in bilateral upper and lower extremities  Lab Results: Basic Metabolic Panel:  Recent Labs Lab 10/20/14 1403 10/20/14 1949 10/21/14 0333  NA  --  138 140  K  --  4.7 4.4  CL  --  106 107  CO2  --  18* 18*  GLUCOSE  --  131* 79  BUN  --  44* 38*  CREATININE  --  1.70* 1.49*  CALCIUM  --  8.5 8.5  MG 2.0  --   --   PHOS 4.1  --   --    Liver Function Tests: No results for input(s): AST, ALT, ALKPHOS, BILITOT, PROT, ALBUMIN in the last 168 hours. No results for input(s): LIPASE, AMYLASE in the last 168 hours. No results for input(s): AMMONIA in the last 168 hours. CBC:  Recent Labs Lab 10/20/14 0935  WBC 4.5  NEUTROABS 2.7  HGB 11.4*  HCT 34.9*  MCV 94.8  PLT 294   Cardiac Enzymes:  Recent Labs Lab 10/20/14 1639 10/20/14 2213 10/21/14 0333  TROPONINI <0.30 <0.30 <0.30   BNP: No results for input(s): PROBNP in the last 168 hours. D-Dimer: No results for  input(s): DDIMER in the last 168 hours. CBG: No results for input(s): GLUCAP in the last 168 hours. Hemoglobin A1C: No results for input(s): HGBA1C in the last 168 hours. Fasting Lipid Panel: No results for input(s): CHOL, HDL, LDLCALC, TRIG, CHOLHDL, LDLDIRECT in the last 168 hours. Thyroid Function Tests: No results for input(s): TSH, T4TOTAL, FREET4, T3FREE, THYROIDAB in the last 168 hours. Coagulation: No results for input(s): LABPROT, INR in the last 168 hours. Anemia Panel:  Recent Labs Lab 10/20/14 0935  FERRITIN 365*  TIBC 306  IRON 135   Urine Drug Screen: Drugs of Abuse     Component Value Date/Time   LABOPIA NONE DETECTED 06/26/2014 1552   COCAINSCRNUR NONE DETECTED 06/26/2014 1552   LABBENZ NONE DETECTED 06/26/2014 1552   AMPHETMU NONE DETECTED 06/26/2014 1552   THCU NONE DETECTED 06/26/2014 1552   LABBARB NONE DETECTED 06/26/2014 1552    Alcohol Level: No results for input(s): ETH in the last 168 hours. Urinalysis: No results for input(s): COLORURINE, LABSPEC, PHURINE, GLUCOSEU, HGBUR, BILIRUBINUR, KETONESUR, PROTEINUR, UROBILINOGEN, NITRITE, LEUKOCYTESUR in the last 168 hours.  Invalid input(s): APPERANCEUR Misc. Labs:   [employer]  Results: No results found for this or any previous visit (from the past 240 hour(s)). Studies/Results: No results found. Medications: I have reviewed the patient's current medications. Scheduled Meds: . amLODipine  5 mg Oral Daily  . cilostazol  100 mg Oral BID  . furosemide  20 mg Oral Daily  . heparin  5,000 Units Subcutaneous 3 times per day  . simvastatin  20 mg Oral Daily  . sodium chloride  3 mL Intravenous Q12H  . terazosin  1 mg Oral QHS   Continuous Infusions:  PRN Meds:.hydrALAZINE Assessment/Plan: Principal Problem:   Hyperkalemia  73 yo male with CKD 3, hyporenimic hypoaldosteronism here with hyperkalemia likely 2/2 .  Hyperkalemia 2/2 to hyporenimic hypoaldosteronism. Admission K+ 7.3. Was supposed to take  florinef and lasix 20mg  daily. Couldn't afford florinef but now has medicare. - had peaked TW on V3, repeat EKG showed improvement. Treated hyperkalemia with insulin 10 u + d50 1 amp + 1g calcium gluconate and kayexalate 30. K+ normalized today.  - held beta blocker (coreg) in the setting of hyperkalemia. - will start on florinef 0.1 mg daily. If he cannot afford it, will do daily kayexalate. - cont lasix 20mg  po daily - will give him instruction to avoid foods with high K+.  HTN - was on coreg, lasix, amlodopine at home - will hold coreg in the setting of hyperkalemia.  - Continue lasix and amlodpine. Will increase amlodopine to 10mg  as BP elevated in 150-170s.  CKD 3 - baseline around 1.6-1.8. Stable.  - will need referral to nephrology to set up him outpatient follow up. - renal diet.  PVD - continue cilostazol.  Diet: renal Ppx: heparin.  Dispo: Disposition is deferred at this time, awaiting improvement of current medical problems.  Anticipated discharge in approximately 1 day(s).   The patient does have a current PCP Luan Moore, MD) and does need an [facility] hospital follow-up appointment after discharge.  The patient does have transportation limitations that hinder transportation to clinic appointments.  .Services Needed at time of discharge: Y = Yes, Blank = No PT:   OT:   RN:   Equipment:   Other:     LOS: 1 day   Dellia Nims, MD 10/21/2014, 9:28 AM

## 2014-10-21 NOTE — Plan of Care (Signed)
Problem: Phase I Progression Outcomes Goal: Pain controlled with appropriate interventions Outcome: Completed/Met Date Met:  10/21/14

## 2014-10-21 NOTE — Discharge Instructions (Signed)
You were admitted with high potassium. It's very important to avoid food with high potassium.  You will need to take a medicine called florinef to lower your K+. Its very important that you get this. It is around 15 dollars at [employer]. We gave you coupon for it.  You will need to see a kidney doctor outpatient.  Some foods that are high in potassium that you should avoid:   Avocado Artichokes Black beans Chocolate Bananas Baked beans Clams Dairy products Coconut Beets Ground beef Granola Cantaloupe and honeydew melons Broccoli Kidney beans Milk Dates Brussels sprouts Lobster Peanut butter Dried fruits Cabbage (raw) Navy beans Soups that are salt-free or low-sodium Figs Carrots (raw) Pinto beans Soy milk Kiwi Chard [place] Sports drinks Mango Olives Sardines Tomato sauce Nectarines Potatoes (white and sweet) Scallops Wheat bran and bran products Oranges and orange juice Pickles Steak Whole-grain bread Prunes and prune juice Pumpkin Whitefish Yogurt Raisins Rutabaga     Avoid these foods as much as possible.

## 2014-10-21 NOTE — Discharge Summary (Signed)
Name: Derrick Hendrix MRN: [id number] DOB: Jul 11, 1941 73 y.o. PCP: Derrick Moore, MD  Date of Admission: 10/20/2014 12:39 PM Date of Discharge: 10/21/2014 Attending Physician: Derrick Riches, MD  Discharge Diagnosis:  Principal Problem:   Hyperkalemia Active Problems:   Essential hypertension  Discharge Medications:   Medication List    STOP taking these medications        carvedilol 6.25 MG tablet  Commonly known as:  COREG      TAKE these medications        amLODipine 10 MG tablet  Commonly known as:  NORVASC  Take 1 tablet (10 mg total) by mouth daily.     cilostazol 100 MG tablet  Commonly known as:  PLETAL  Take 1 tablet (100 mg total) by mouth 2 (two) times daily.     fludrocortisone 0.1 MG tablet  Commonly known as:  FLORINEF  Take 1 tablet (0.1 mg total) by mouth daily.     furosemide 40 MG tablet  Commonly known as:  LASIX  Take 0.5 tablets (20 mg total) by mouth daily.     nitroGLYCERIN 0.4 MG SL tablet  Commonly known as:  NITROSTAT  Place 1 tablet (0.4 mg total) under the tongue every 5 (five) minutes x 3 doses as needed for chest pain.     simvastatin 20 MG tablet  Commonly known as:  ZOCOR  Take 1 tablet (20 mg total) by mouth daily.     terazosin 1 MG capsule  Commonly known as:  HYTRIN  Take 1 capsule (1 mg total) by mouth at bedtime.        Disposition and follow-up:   Derrick Hendrix was discharged from [facility] in Stable condition.  At the hospital follow up visit please address:  1.  Needs nephrology referral outpatient for his CKD. Needs K+ monitoring closely. We started on florinef for his hyporeninemic hypoaldosteronism treatment. This was after discuss with nephrology. Please make sure he is taking it.   We stopped coreg because of hyper K+ and increased amlodipine to 10mg  for HTN.  Consider restarting PO bicarb, perhaps bicarb 650mg  TID?  2.  Labs / imaging needed at time of follow-up: BMP (K+)  3.   Pending labs/ test needing follow-up:   Follow-up Appointments:   Discharge Instructions:   Consultations:    Procedures Performed:  No results found.  2D Echo:   Cardiac Cath:   Admission HPI:   73 yo male with HTN, hyporenimic hypoaldosteronism, CKD 3 presented to the clinic for regular follow up. Found to have hyperkalemia on labs 7.3.last was 5.6 on 09/28/14. Admitted for hyperkalemia management.   Has been feeling occassional dizziness, tired, and occasional palpitation last 2 days ago. Wasn't able to afford florinief. Recently got medicare. Has been compliant with all of his meds including 20mg  lasix daily. States he has been careful about his diet but did mention eating spinach in moderation. Denies any fever/chills/n/v/dysuria/sob/chest pain. Feeling fine currently.   Hospital Course by problem list: Principal Problem:   Hyperkalemia Active Problems:   Essential hypertension   73 yo male with CKD 3, hyporenimic hypoaldosteronism here with hyperkalemia likely 2/2 .  Hyperkalemia 2/2 to hyporenimic hypoaldosteronism. Admission K+ 7.3. Was supposed to take florinef and lasix 20mg  daily. Couldn't afford florinef but now has medicare. - had peaked TW on V3, repeat EKG showed improvement. Treated hyperkalemia with insulin 10 u + d50 1 amp + 1g calcium gluconate and kayexalate 30. K+ normalized today.  -  held beta blocker (coreg) in the setting of hyperkalemia. - will start on florinef 0.1 mg daily. Gave coupon for this. It's about $15 at [place]. - cont lasix 20mg  po daily - gave him instruction to avoid foods with high K+. Gave a list of food to avoid.  HTN - was on coreg, lasix, amlodopine at home -  hold coreg in the setting of hyperkalemia.  - Continue lasix and amlodpine. Will increase amlodopine to 10mg  as BP elevated in 150-170s.  CKD 3 - baseline around 1.6-1.8. Stable.  - will need referral to nephrology to set up him outpatient follow up. - renal diet. -  Consider restarting PO bicarb, perhaps bicarb 650mg  TID at follow up?  PVD - continue cilostazol.   Discharge Vitals:   BP 142/73 mmHg  Pulse 75  Temp(Src) 98.3 F (36.8 C) (Oral)  Resp 17  Ht 6\' 2"  (1.88 m)  SpO2 100%  Discharge Labs:  Results for orders placed or performed during the hospital encounter of 10/20/14 (from the past 24 hour(s))  Magnesium     Status: None   Collection Time: 10/20/14  2:03 PM  Result Value Ref Range   Magnesium 2.0 1.5 - 2.5 mg/dL  Phosphorus     Status: None   Collection Time: 10/20/14  2:03 PM  Result Value Ref Range   Phosphorus 4.1 2.3 - 4.6 mg/dL  Troponin I (q 6hr x 3)     Status: None   Collection Time: 10/20/14  4:39 PM  Result Value Ref Range   Troponin I <0.30 <0.30 ng/mL  Basic metabolic panel     Status: Abnormal   Collection Time: 10/20/14  7:49 PM  Result Value Ref Range   Sodium 138 137 - 147 mEq/L   Potassium 4.7 3.7 - 5.3 mEq/L   Chloride 106 96 - 112 mEq/L   CO2 18 (L) 19 - 32 mEq/L   Glucose, Bld 131 (H) 70 - 99 mg/dL   BUN 44 (H) 6 - 23 mg/dL   Creatinine, Ser 1.70 (H) 0.50 - 1.35 mg/dL   Calcium 8.5 8.4 - 10.5 mg/dL   GFR calc non Af Amer 38 (L) >90 mL/min   GFR calc Af Amer 44 (L) >90 mL/min   Anion gap 14 5 - 15  Troponin I (q 6hr x 3)     Status: None   Collection Time: 10/20/14 10:13 PM  Result Value Ref Range   Troponin I <0.30 <0.30 ng/mL  Troponin I (q 6hr x 3)     Status: None   Collection Time: 10/21/14  3:33 AM  Result Value Ref Range   Troponin I <0.30 <0.30 ng/mL  Basic metabolic panel     Status: Abnormal   Collection Time: 10/21/14  3:33 AM  Result Value Ref Range   Sodium 140 137 - 147 mEq/L   Potassium 4.4 3.7 - 5.3 mEq/L   Chloride 107 96 - 112 mEq/L   CO2 18 (L) 19 - 32 mEq/L   Glucose, Bld 79 70 - 99 mg/dL   BUN 38 (H) 6 - 23 mg/dL   Creatinine, Ser 1.49 (H) 0.50 - 1.35 mg/dL   Calcium 8.5 8.4 - 10.5 mg/dL   GFR calc non Af Amer 44 (L) >90 mL/min   GFR calc Af Amer 51 (L) >90 mL/min    Anion gap 15 5 - 15    Signed: Dellia Nims, MD 10/21/2014, 11:50 AM    Services Ordered on Discharge:  Equipment Ordered on  Discharge:

## 2014-10-23 ENCOUNTER — Other Ambulatory Visit: Payer: Self-pay | Admitting: Internal Medicine

## 2014-10-23 DIAGNOSIS — E2749 Other adrenocortical insufficiency: Secondary | ICD-10-CM

## 2014-10-23 MED ORDER — FLUDROCORTISONE ACETATE 0.1 MG PO TABS
0.1000 mg | ORAL_TABLET | Freq: Every day | ORAL | Status: DC
Start: 2014-10-23 — End: 2014-10-23

## 2014-10-23 MED ORDER — FLUDROCORTISONE ACETATE 0.1 MG PO TABS: 0.1000 mg | ORAL_TABLET | Freq: Every day | ORAL | Status: DC

## 2014-10-27 ENCOUNTER — Ambulatory Visit: Payer: Medicare Other | Admitting: Internal Medicine

## 2014-10-27 ENCOUNTER — Encounter: Payer: Self-pay | Admitting: Internal Medicine

## 2014-11-01 ENCOUNTER — Telehealth: Payer: Self-pay | Admitting: *Deleted

## 2014-11-01 NOTE — Telephone Encounter (Signed)
Pt called person that filled his pill box is out of town for 1 week - does not know what to do.  Unable to talk with pt - suggest to call clinic back or take peds and pill box to pharmacy to help fill pill box. Hilda Blades Sayward Horvath RN 11/01/14 4PM

## 2014-11-03 ENCOUNTER — Telehealth: Payer: Self-pay | Admitting: Licensed Clinical Social Worker

## 2014-11-03 NOTE — Telephone Encounter (Signed)
CSW received call from Raina Mina RN, [place].  Care Manager currently at pt's home.  Pt has missed appointment due to lack of coordination with [facility] and has not been taking medications due to pill box not being filled.  Generally pt's niece has been a support but currently she is ill.  [facility] will fill Mr. Obenshain pill box x 2 weeks (pt has all needed medication) and has rescheduled [facility] appointment and confirmed [facility] transportation.  [facility] request pt's current med list to be fax to [facility], as they deliver and will fill pt's pill box x 4 weeks at a time every 30 days.  [facility] will confirm with insurance, no cost differences with switching pharmacies.  CSW faxed med list report to [facility], atten: Sam.

## 2014-11-07 ENCOUNTER — Ambulatory Visit (INDEPENDENT_AMBULATORY_CARE_PROVIDER_SITE_OTHER): Payer: Medicare Other | Admitting: Internal Medicine

## 2014-11-07 ENCOUNTER — Encounter: Payer: Self-pay | Admitting: Internal Medicine

## 2014-11-07 ENCOUNTER — Encounter: Payer: Self-pay | Admitting: Licensed Clinical Social Worker

## 2014-11-07 VITALS — BP 150/73 | HR 88 | Temp 97.9°F | Ht 74.0 in | Wt 170.7 lb

## 2014-11-07 DIAGNOSIS — N183 Chronic kidney disease, stage 3 unspecified: Secondary | ICD-10-CM

## 2014-11-07 DIAGNOSIS — I10 Essential (primary) hypertension: Secondary | ICD-10-CM | POA: Diagnosis not present

## 2014-11-07 DIAGNOSIS — E2749 Other adrenocortical insufficiency: Secondary | ICD-10-CM | POA: Diagnosis not present

## 2014-11-07 DIAGNOSIS — Z0000 Encounter for general adult medical examination without abnormal findings: Secondary | ICD-10-CM | POA: Diagnosis not present

## 2014-11-07 DIAGNOSIS — I208 Other forms of angina pectoris: Secondary | ICD-10-CM

## 2014-11-07 DIAGNOSIS — K5900 Constipation, unspecified: Secondary | ICD-10-CM | POA: Diagnosis not present

## 2014-11-07 HISTORY — DX: Constipation, unspecified: K59.00

## 2014-11-07 LAB — BASIC METABOLIC PANEL
BUN: 37 mg/dL — ABNORMAL HIGH (ref 6–23)
CHLORIDE: 106 meq/L (ref 96–112)
CO2: 23 mEq/L (ref 19–32)
Calcium: 9.4 mg/dL (ref 8.4–10.5)
Creat: 1.53 mg/dL — ABNORMAL HIGH (ref 0.50–1.35)
GLUCOSE: 87 mg/dL (ref 70–99)
Potassium: 5.1 mEq/L (ref 3.5–5.3)
SODIUM: 142 meq/L (ref 135–145)

## 2014-11-07 MED ORDER — POLYETHYLENE GLYCOL 3350 17 G PO PACK: 17.0000 g | PACK | Freq: Every day | ORAL | Status: DC

## 2014-11-07 NOTE — Assessment & Plan Note (Addendum)
GFR upon hospital discharge was 51, indicating stage III chronic kidney disease. Patient has never been evaluated by outpatient nephrology.  - Referral to nephrology  - Follow-up basic metabolic panel  Addendum [id number]: Creatinine near baseline at 1.51.

## 2014-11-07 NOTE — Assessment & Plan Note (Signed)
BP Readings from Last 3 Encounters:  11/07/14 150/73  10/21/14 151/80  10/20/14 123/68    Lab Results  Component Value Date   NA 140 10/21/2014   K 4.4 10/21/2014   CREATININE 1.49* 10/21/2014    Assessment: Blood pressure control: mildly elevated Progress toward BP goal:  unchanged Comments: Patient is currently compliant with his amlodipine 10 mg daily, Lasix 20 mg.  Plan: Medications:  continue current medications

## 2014-11-07 NOTE — Assessment & Plan Note (Addendum)
Patient has had several episodes of hyperkalemia in the past, recently admitted to the hospital for potassium of 7.3. Patient was previously having issues with affording Florinef, but after his last hospitalization, has been able to be connected with the appropriate resources and has been compliant with it. Patient is also in compliant with his Lasix 20 mg daily.  - Follow-up basic metabolic panel today  - Referral to nephrology since he has never been to nephrology  Addendum 11/07/2014: Potassium within normal limits at 5.1.

## 2014-11-07 NOTE — Progress Notes (Signed)
   Subjective:    Patient ID: Derrick Hendrix, male    DOB: Aug 26, 1941, 73 y.o.   MRN: [id number]  HPI  Patient is a 73 year old gentleman with a history of tobacco abuse, hypertension, hyporeninemic hypoaldosteronism, and peripheral vascular disease who presents to clinic for hospital follow-up. Patient was recently admitted for hyperkalemia up to 7.3.  Please refer to separate problem-list charting for more details.  Review of Systems  Constitutional: Negative for fever and chills.  HENT: Negative for sore throat.   Respiratory: Negative for shortness of breath.   Cardiovascular: Negative for chest pain and palpitations.  Gastrointestinal: Positive for constipation. Negative for nausea, vomiting, abdominal pain, diarrhea and blood in stool.  Genitourinary: Negative for dysuria and hematuria.  Skin: Negative for rash.  Neurological: Negative for syncope.  Psychiatric/Behavioral: Negative for suicidal ideas.       Objective:   Physical Exam  Constitutional: He is oriented to person, place, and time. He appears well-developed and well-nourished. No distress.  HENT:  Head: Normocephalic and atraumatic.  Eyes: EOM are normal. Pupils are equal, round, and reactive to light. No scleral icterus.  Neck: Normal range of motion. Neck supple. No thyromegaly present.  Cardiovascular: Normal rate and regular rhythm.  Exam reveals no gallop and no friction rub.   No murmur heard. Pulmonary/Chest: Effort normal and breath sounds normal. No respiratory distress. He has no wheezes. He has no rales.  Abdominal: Soft. Bowel sounds are normal. He exhibits no distension. There is no tenderness. There is no rebound.  Musculoskeletal: Normal range of motion. He exhibits no edema.  Neurological: He is alert and oriented to person, place, and time. No cranial nerve deficit.  Skin: No rash noted.      Assessment & Plan:  Please refer to separate problem-list charting for more details.

## 2014-11-07 NOTE — Progress Notes (Signed)
Derrick Hendrix requesting to speak with CSW during his scheduled [facility] appointment.  Pt states he went to pick up his prescriptions from [facility] but it was not ready, he needed to provide 24 hours for them to prepare.  Derrick Hendrix states he was not informed of the need to give them 24 hours.  Pt's main concern today is how much his prescription today will cost.  CSW placed call to [facility], they would need to run the prescription but in general the packets are not covered under insurance but the powder would be covered.  Coverage would be normal copayment amount.  Derrick Hendrix states his copay for 2 medications was $30.  Pt's Rx for today can be an OTC item purchased at [facility] for $6.59.  Derrick Hendrix notified but does not have funds with him today.  CSW encouraged Derrick Hendrix Rx can be obtain OTC or through pharmacy.

## 2014-11-07 NOTE — Patient Instructions (Signed)
We will do some follow up blood work to see how your potassium and kidney function is doing. We will let you know the results as soon as they come back. We will also contact you regarding an appointment with a nephrologist, which is a kidney doctor. We have also prescribed some miralax for your constipation.   General Instructions:   Please bring your medicines with you each time you come to clinic.  Medicines may include prescription medications, over-the-counter medications, herbal remedies, eye drops, vitamins, or other pills.   Progress Toward Treatment Goals:  Treatment Goal 10/20/2014  Blood pressure at goal  Stop smoking smoking the same amount    Self Care Goals & Plans:  Self Care Goal 11/07/2014  Manage my medications take my medicines as prescribed; bring my medications to every visit; refill my medications on time  Eat healthy foods drink diet soda or water instead of juice or soda; eat more vegetables; eat foods that are low in salt; eat baked foods instead of fried foods; eat fruit for snacks and desserts  Be physically active -  Stop smoking -    No flowsheet data found.   Care Management & Community Referrals:  No flowsheet data found.

## 2014-11-07 NOTE — Assessment & Plan Note (Addendum)
Patient reports that is has been about 5 days since his last bowel movement. Patient denies any significant abdominal pain, tenderness, vomiting, or nausea. Physical exam is reassuring.  - Miralax PRN

## 2014-11-07 NOTE — Assessment & Plan Note (Signed)
Patient has over a 40-pack-year history and is still a current smoker. Patient has not received any screening for lung cancer or AAA. Patient states that he is interested in these screening studies but would like to defer to his next visit.  - Revisit low dose CT and ultrasound for AAA screening at next visit

## 2014-11-10 NOTE — Progress Notes (Signed)
Internal Medicine Clinic Attending  I saw and evaluated the patient.  I personally confirmed the key portions of the history and exam documented by Dr. Raelene Bott and I reviewed pertinent patient test results.  The assessment, diagnosis, and plan were formulated together and I agree with the documentation in the resident's note.  If BP continues to be elevated at next visit, will need up titration of one of his medications or addition of another therapy.

## 2014-11-28 ENCOUNTER — Other Ambulatory Visit: Payer: Self-pay | Admitting: *Deleted

## 2014-11-28 DIAGNOSIS — I70219 Atherosclerosis of native arteries of extremities with intermittent claudication, unspecified extremity: Secondary | ICD-10-CM

## 2014-11-28 DIAGNOSIS — E875 Hyperkalemia: Secondary | ICD-10-CM

## 2014-11-28 DIAGNOSIS — I10 Essential (primary) hypertension: Secondary | ICD-10-CM

## 2014-11-29 MED ORDER — FUROSEMIDE 40 MG PO TABS: 20.0000 mg | ORAL_TABLET | Freq: Every day | ORAL | Status: DC

## 2014-11-29 MED ORDER — FLUDROCORTISONE ACETATE 0.1 MG PO TABS: 0.1000 mg | ORAL_TABLET | Freq: Every day | ORAL | Status: DC

## 2014-11-29 MED ORDER — POLYETHYLENE GLYCOL 3350 17 G PO PACK: 17.0000 g | PACK | Freq: Every day | ORAL | Status: DC

## 2014-11-29 MED ORDER — TERAZOSIN HCL 1 MG PO CAPS: 1.0000 mg | ORAL_CAPSULE | Freq: Every day | ORAL | Status: DC

## 2014-11-29 MED ORDER — AMLODIPINE BESYLATE 10 MG PO TABS: 10.0000 mg | ORAL_TABLET | Freq: Every day | ORAL | Status: DC

## 2014-11-29 MED ORDER — CILOSTAZOL 100 MG PO TABS: 100.0000 mg | ORAL_TABLET | Freq: Two times a day (BID) | ORAL | Status: DC

## 2014-11-29 MED ORDER — SIMVASTATIN 20 MG PO TABS: 20.0000 mg | ORAL_TABLET | Freq: Every day | ORAL | Status: DC

## 2014-11-29 MED ORDER — NITROGLYCERIN 0.4 MG SL SUBL: 0.4000 mg | SUBLINGUAL_TABLET | SUBLINGUAL | Status: DC | PRN

## 2014-12-05 ENCOUNTER — Telehealth: Payer: Self-pay | Admitting: *Deleted

## 2014-12-05 NOTE — Telephone Encounter (Signed)
Call from [name], [place] with [facility] # [phone]  Pt called [name] yesterday because he had changed pharmacy's and was given Lasix 40 mg instead of Lasix 20 mg.  This was for a 2 days. He was c/o headaches so nurse went to home and checked vitals.  BP 158/82 Pulse 80 and Sats 93%     Pharmacy went to check med box but when tech got there Derrick Hendrix  had an episode and had to take NTG X2 under tongue.  Felt better after that.   Today pt feels better and he will call [facility] for problems.   I called pt and he feels good today.  He now understands how to take him meds.  No c/o.

## 2014-12-05 NOTE — Telephone Encounter (Signed)
Reviewed call as noted by RN Edd Fabian.  Appears patient has a history of stable angina.  Appreciate [facility]'s assistance in care.

## 2015-01-08 ENCOUNTER — Encounter: Payer: Self-pay | Admitting: Internal Medicine

## 2015-01-08 ENCOUNTER — Encounter: Payer: Medicare Other | Admitting: Internal Medicine

## 2015-02-01 ENCOUNTER — Telehealth: Payer: Self-pay | Admitting: *Deleted

## 2015-02-01 NOTE — Telephone Encounter (Signed)
Call from case worker with [facility] She wanted to report another pharmacy error with [facility].  This pharmacy comes to the house and fills pill box for pt. Back on 12/22 there was an error with Lasix Today [facility] states pt was given Coreg 6.25 mg since 2/1.  This was an old Rx that was on his list at [facility]. Pharmacy is going out to the house today and will remove all Coreg.  FYI

## 2015-02-05 ENCOUNTER — Encounter: Payer: Medicare Other | Admitting: Internal Medicine

## 2015-02-06 ENCOUNTER — Ambulatory Visit (INDEPENDENT_AMBULATORY_CARE_PROVIDER_SITE_OTHER): Payer: Medicare Other | Admitting: Internal Medicine

## 2015-02-06 ENCOUNTER — Encounter: Payer: Self-pay | Admitting: Internal Medicine

## 2015-02-06 VITALS — BP 131/75 | HR 82 | Temp 98.3°F | Wt 175.5 lb

## 2015-02-06 DIAGNOSIS — E2749 Other adrenocortical insufficiency: Secondary | ICD-10-CM | POA: Diagnosis not present

## 2015-02-06 DIAGNOSIS — E2740 Unspecified adrenocortical insufficiency: Secondary | ICD-10-CM

## 2015-02-06 DIAGNOSIS — M792 Neuralgia and neuritis, unspecified: Secondary | ICD-10-CM

## 2015-02-06 DIAGNOSIS — Z0000 Encounter for general adult medical examination without abnormal findings: Secondary | ICD-10-CM

## 2015-02-06 DIAGNOSIS — N183 Chronic kidney disease, stage 3 unspecified: Secondary | ICD-10-CM

## 2015-02-06 DIAGNOSIS — F1010 Alcohol abuse, uncomplicated: Secondary | ICD-10-CM

## 2015-02-06 DIAGNOSIS — F17200 Nicotine dependence, unspecified, uncomplicated: Secondary | ICD-10-CM

## 2015-02-06 DIAGNOSIS — R209 Unspecified disturbances of skin sensation: Secondary | ICD-10-CM

## 2015-02-06 DIAGNOSIS — I129 Hypertensive chronic kidney disease with stage 1 through stage 4 chronic kidney disease, or unspecified chronic kidney disease: Secondary | ICD-10-CM

## 2015-02-06 DIAGNOSIS — G629 Polyneuropathy, unspecified: Secondary | ICD-10-CM

## 2015-02-06 DIAGNOSIS — J069 Acute upper respiratory infection, unspecified: Secondary | ICD-10-CM | POA: Diagnosis not present

## 2015-02-06 DIAGNOSIS — I10 Essential (primary) hypertension: Secondary | ICD-10-CM

## 2015-02-06 HISTORY — DX: Neuralgia and neuritis, unspecified: M79.2

## 2015-02-06 LAB — BASIC METABOLIC PANEL
BUN: 29 mg/dL — AB (ref 6–23)
CHLORIDE: 110 meq/L (ref 96–112)
CO2: 24 meq/L (ref 19–32)
Calcium: 9.1 mg/dL (ref 8.4–10.5)
Creat: 1.61 mg/dL — ABNORMAL HIGH (ref 0.50–1.35)
GLUCOSE: 73 mg/dL (ref 70–99)
POTASSIUM: 4.8 meq/L (ref 3.5–5.3)
Sodium: 141 mEq/L (ref 135–145)

## 2015-02-06 LAB — VITAMIN B12: Vitamin B-12: 290 pg/mL (ref 211–911)

## 2015-02-06 MED ORDER — DM-GUAIFENESIN ER 30-600 MG PO TB12: 1.0000 | ORAL_TABLET | Freq: Two times a day (BID) | ORAL | Status: DC

## 2015-02-06 MED ORDER — GABAPENTIN 100 MG PO CAPS: 100.0000 mg | ORAL_CAPSULE | Freq: Three times a day (TID) | ORAL | Status: DC

## 2015-02-06 MED ORDER — IPRATROPIUM BROMIDE 0.06 % NA SOLN: 2.0000 | Freq: Three times a day (TID) | NASAL | Status: DC

## 2015-02-06 NOTE — Progress Notes (Signed)
   Subjective:    Patient ID: Derrick Hendrix, male    DOB: January 30, 1941, 74 y.o.   MRN: [id number]  HPI  Patient is a 74 year old with a history of stage III chronic kidney disease, hypertension, hypo-renin anemic hypoaldosteronism who presents to clinic for an 8 day history of upper respiratory tract infection symptoms.  Please refer to separate problem-list charting for more details.   Review of Systems  Constitutional: Negative for fever and chills.  HENT: Positive for congestion, postnasal drip, rhinorrhea and sinus pressure. Negative for sore throat.   Respiratory: Positive for cough. Negative for shortness of breath.   Cardiovascular: Negative for chest pain and palpitations.  Gastrointestinal: Negative for nausea, vomiting, abdominal pain, diarrhea, constipation and blood in stool.  Genitourinary: Negative for dysuria and hematuria.  Skin: Negative for rash.  Neurological: Negative for syncope.       Burning sensation in toe  Psychiatric/Behavioral: Negative for suicidal ideas.       Objective:   Physical Exam  Constitutional: He is oriented to person, place, and time. He appears well-developed and well-nourished. No distress.  HENT:  Head: Normocephalic and atraumatic.  Eyes: EOM are normal. Pupils are equal, round, and reactive to light. No scleral icterus.  Neck: Normal range of motion. Neck supple. No thyromegaly present.  Cardiovascular: Normal rate and regular rhythm.  Exam reveals no gallop and no friction rub.   No murmur heard. Pulmonary/Chest: Effort normal and breath sounds normal. No respiratory distress. He has no wheezes. He has no rales.  Abdominal: Soft. Bowel sounds are normal. He exhibits no distension. There is no tenderness. There is no rebound.  Musculoskeletal: Normal range of motion. He exhibits no edema.  Neurological: He is alert and oriented to person, place, and time. No cranial nerve deficit.  Sensation intact, right fifth toe with no abnormalities  upon inspection, no tenderness to palpation, normal active and passive range of motion  Skin: No rash noted.          Assessment & Plan:  Please refer to separate problem-list charting for more details.

## 2015-02-06 NOTE — Assessment & Plan Note (Signed)
Patient with several episodes of hyperkalemia necessitating hospitalization within the last year. This was in the context of noncompliance with Florinef because of financial issues but patient states that he has been stable lately taking it for the last several months now. Patient also states that he is compliant with his Lasix 20 mg daily. Patient was referred to nephrology at her last clinic visit but has not been able to make it to the appointment because of transportation issues. -Repeat basic metabolic panel today -Reconnect patient with nephrology -Continue with Florinef and Lasix

## 2015-02-06 NOTE — Assessment & Plan Note (Addendum)
Patient reporting a several week history of severe right fifth toe burning sensation that is most remarkable at night and sometimes keeps him up at night. Patient states that he has not had any injury to the area and he does not have any history of diabetes. Exam was unremarkable for any abnormalities. Patient did have a vitamin B-12 level that was on the lower limit of normal from 06/26/2014. He was given a vitamin B-12 injection that month. Since then, patient has not had any repeat vitamin B-12 levels. -Trial of gabapentin 100 mg 3 times a day for symptomatic management -We'll follow-up glucose level from basic metabolic panel -Follow-up B-12 level  Addendum 02/07/15:  Vitamin B-12 level within normal limits at 290. Glucose within normal limits at 73. Low suspicion for diabetes.

## 2015-02-06 NOTE — Progress Notes (Signed)
Internal Medicine Clinic Attending  Case discussed with Dr. Ngo at the time of the visit.  We reviewed the resident's history and exam and pertinent patient test results.  I agree with the assessment, diagnosis, and plan of care documented in the resident's note. 

## 2015-02-06 NOTE — Assessment & Plan Note (Addendum)
BP Readings from Last 3 Encounters:  02/06/15 131/75  11/07/14 150/73  10/21/14 151/80    Lab Results  Component Value Date   NA 142 11/07/2014   K 5.1 11/07/2014   CREATININE 1.53* 11/07/2014    Assessment: Blood pressure control: controlled Progress toward BP goal:  at goal Comments: Patient states that he is compliant with his amlodipine 10 mg daily, Lasix 20 mg twice a day. There is some documentation in the charts that there is a pharmacy error and that the patient had been delivered Coreg in addition to his other medications. This has since been corrected and the pharmacy has removed all Coreg from the patient's home.  Plan: Medications:  continue current medications Educational resources provided:   Self management tools provided:   Other plans: Will repeat basic metabolic panel today.

## 2015-02-06 NOTE — Patient Instructions (Addendum)
We have run some blood tests to measure your potassium as well as your renal function again today. I will call you with any abnormal results that require any changes in your medications. I have prescribed a nasal spray as well as a medication to help with her cough and congestion. If you do not feel better in a week from now, please let us know in clinic and we can change her medications over the phone.   I'm sorry that your small toe has been bothering you. I have prescribed a medication called gabapentin that he can take 3 times a day to potentially help with this burning sensation that you are having. We have also taken some labs to help [place] determine the cause of this pain, which could potentially be a vitamin deficiency or diabetes.  Please also keep an ear out for further information regarding your nephrology clinic appointment and transportation options.  General Instructions:   Thank you for bringing your medicines today. This helps [place] keep you safe from mistakes.   Progress Toward Treatment Goals:  Treatment Goal 11/07/2014  Blood pressure unchanged  Stop smoking smoking the same amount    Self Care Goals & Plans:  Self Care Goal 11/07/2014  Manage my medications take my medicines as prescribed; bring my medications to every visit; refill my medications on time  Eat healthy foods drink diet soda or water instead of juice or soda; eat more vegetables; eat foods that are low in salt; eat baked foods instead of fried foods; eat fruit for snacks and desserts  Be physically active -  Stop smoking -    No flowsheet data found.   Care Management & Community Referrals:  No flowsheet data found.

## 2015-02-06 NOTE — Assessment & Plan Note (Addendum)
Patient has not been able to attend nephrology appointment. -Patient back with nephrology appointment. -Follow-up basic metabolic panel from today.  Addendum 02/07/2015: Creatinine of 1.61 with a steady trend up from previous value of 1.53.

## 2015-02-06 NOTE — Assessment & Plan Note (Signed)
Patient due for several routine health screening procedures such as colonoscopy, low-dose CT given smoking history, and AAA screening. Given accessibility issues with attending the last referral to nephrology, will hold off on these other measures until after patient has been to see nephrology.

## 2015-02-06 NOTE — Assessment & Plan Note (Signed)
Patient reporting a day history of progressively worsening cough productive of yellow phlegm that is associated with rhinorrhea, nasal congestion, and postnasal drip. Patient denies any fevers or myalgias. Patient denies any associated sore throat or dyspnea. -Will pursue symptomatic treatment with dextromethorphan, guaifenesin, and intranasal ipratropium. -Short of the patient to call the clinic should his symptoms not improve in 7 days, at which point we may consider the addition of antibiotics for a bacterial rhinosinusitis.

## 2015-04-24 ENCOUNTER — Encounter: Payer: Self-pay | Admitting: *Deleted

## 2015-05-09 ENCOUNTER — Other Ambulatory Visit: Payer: Self-pay | Admitting: *Deleted

## 2015-05-09 MED ORDER — GABAPENTIN 100 MG PO CAPS: 100.0000 mg | ORAL_CAPSULE | Freq: Three times a day (TID) | ORAL | Status: DC

## 2015-05-09 MED ORDER — FLUDROCORTISONE ACETATE 0.1 MG PO TABS: 0.1000 mg | ORAL_TABLET | Freq: Every day | ORAL | Status: DC

## 2015-07-19 ENCOUNTER — Encounter: Payer: Self-pay | Admitting: Internal Medicine

## 2015-07-19 ENCOUNTER — Ambulatory Visit (INDEPENDENT_AMBULATORY_CARE_PROVIDER_SITE_OTHER): Payer: Medicare Other | Admitting: Internal Medicine

## 2015-07-19 VITALS — BP 132/89 | HR 81 | Temp 98.0°F | Wt 192.8 lb

## 2015-07-19 DIAGNOSIS — I739 Peripheral vascular disease, unspecified: Secondary | ICD-10-CM | POA: Diagnosis not present

## 2015-07-19 DIAGNOSIS — Z23 Encounter for immunization: Secondary | ICD-10-CM

## 2015-07-19 DIAGNOSIS — I10 Essential (primary) hypertension: Secondary | ICD-10-CM

## 2015-07-19 DIAGNOSIS — R634 Abnormal weight loss: Secondary | ICD-10-CM

## 2015-07-19 DIAGNOSIS — M792 Neuralgia and neuritis, unspecified: Secondary | ICD-10-CM

## 2015-07-19 DIAGNOSIS — E2749 Other adrenocortical insufficiency: Secondary | ICD-10-CM

## 2015-07-19 DIAGNOSIS — F17200 Nicotine dependence, unspecified, uncomplicated: Secondary | ICD-10-CM | POA: Diagnosis not present

## 2015-07-19 DIAGNOSIS — I70219 Atherosclerosis of native arteries of extremities with intermittent claudication, unspecified extremity: Secondary | ICD-10-CM

## 2015-07-19 LAB — BASIC METABOLIC PANEL
Anion gap: 5 (ref 5–15)
BUN: 24 mg/dL — AB (ref 6–20)
CALCIUM: 9.3 mg/dL (ref 8.9–10.3)
CO2: 26 mmol/L (ref 22–32)
Chloride: 109 mmol/L (ref 101–111)
Creatinine, Ser: 1.49 mg/dL — ABNORMAL HIGH (ref 0.61–1.24)
GFR calc Af Amer: 51 mL/min — ABNORMAL LOW (ref >60)
GFR calc non Af Amer: 44 mL/min — ABNORMAL LOW (ref >60)
Glucose, Bld: 108 mg/dL — ABNORMAL HIGH (ref 65–99)
Potassium: 5.4 mmol/L — ABNORMAL HIGH (ref 3.5–5.1)
Sodium: 140 mmol/L (ref 135–145)

## 2015-07-19 MED ORDER — CILOSTAZOL 100 MG PO TABS: 100.0000 mg | ORAL_TABLET | Freq: Two times a day (BID) | ORAL | Status: DC

## 2015-07-19 MED ORDER — AMLODIPINE BESYLATE 10 MG PO TABS: 10.0000 mg | ORAL_TABLET | Freq: Every day | ORAL | Status: DC

## 2015-07-19 MED ORDER — FLUDROCORTISONE ACETATE 0.1 MG PO TABS: 0.1000 mg | ORAL_TABLET | Freq: Every day | ORAL | Status: DC

## 2015-07-19 NOTE — Patient Instructions (Addendum)
Today we discontinued gabapentin 100mg  3 times per day.  Your blood work today looked good. Your medication refills were sent to your regular pharmacy. Continue current medications as prescribed.  Try to incorporate additional vegetables in your diet.  You will receive a phone call for details on an appointment with a  Kidney specialist.

## 2015-07-19 NOTE — Progress Notes (Signed)
Subjective:   Patient ID: Derrick Hendrix male   DOB: Mar 14, 1941 74 y.o.   MRN: [id number]  HPI: [name] is a 74 y.o. man with PMHx of CKD3, HTN, hyporeninemic hypoaldosteronism, PAD, HLD who presents for a routine follow up at approximately 6 months since his last visit. He ran out of some medication refills including fludrocortisone, cilazostil, amlodipine, gabapentin and has been off these medications for 5 days. He has not followed up at a Nephrology appointment since his last visit, but now reports he has bus access to medical appointments arranged. He also reports swelling in his right leg since his last office visit, which is now improved since he ran out of medications. He also endorses a nonproductive cough for the past 1-2 weeks that is partially resolved.  Past Medical History  Diagnosis Date  . Hypertension   . Headache(784.0)   . Hyperkalemia 08/2014  . Stroke     hx of PAD  . Chronic kidney disease     HX OF CKD 3   Current Outpatient Prescriptions  Medication Sig Dispense Refill  . amLODipine (NORVASC) 10 MG tablet Take 1 tablet (10 mg total) by mouth daily. 90 tablet 1  . cilostazol (PLETAL) 100 MG tablet Take 1 tablet (100 mg total) by mouth 2 (two) times daily. 60 tablet 3  . dextromethorphan-guaiFENesin (MUCINEX DM) 30-600 MG per 12 hr tablet Take 1 tablet by mouth 2 (two) times daily. 30 tablet 0  . fludrocortisone (FLORINEF) 0.1 MG tablet Take 1 tablet (0.1 mg total) by mouth daily. 90 tablet 3  . furosemide (LASIX) 40 MG tablet Take 0.5 tablets (20 mg total) by mouth daily. 30 tablet 11  . ipratropium (ATROVENT) 0.06 % nasal spray Place 2 sprays into the nose 3 (three) times daily. 15 mL 2  . nitroGLYCERIN (NITROSTAT) 0.4 MG SL tablet Place 1 tablet (0.4 mg total) under the tongue every 5 (five) minutes x 3 doses as needed for chest pain. 30 tablet 3  . polyethylene glycol (MIRALAX) packet Take 17 g by mouth daily. 14 each 0  . simvastatin (ZOCOR) 20 MG  tablet Take 1 tablet (20 mg total) by mouth daily. 30 tablet 11  . terazosin (HYTRIN) 1 MG capsule Take 1 capsule (1 mg total) by mouth at bedtime. 30 capsule 11   No current facility-administered medications for this visit.   Family History  Problem Relation Age of Onset  . Hypertension Mother     died age 29  . Diabetes Mother    History   Social History  . Marital Status: Single    Spouse Name: N/A  . Number of Children: N/A  . Years of Education: N/A   Social History Main Topics  . Smoking status: Current Every Day Smoker -- 1.00 packs/day for 40 years    Types: Cigarettes  . Smokeless tobacco: Never Used     Comment: but hoping to quit one day  1/2PPD  . Alcohol Use: 5.4 oz/week    9 Cans of beer per week     Comment: Liquor once a week.  . Drug Use: No  . Sexual Activity: Not on file   Other Topics Concern  . Not on file   Social History Narrative   Works in the yard         Review of Systems: Review of Systems  Constitutional: Negative for fever, chills, weight loss and malaise/fatigue.  Eyes: Negative for blurred vision and double vision.  Respiratory: Positive for  cough.   Cardiovascular: Positive for claudication and leg swelling.  Gastrointestinal: Negative for nausea, abdominal pain, diarrhea and constipation.  Genitourinary: Negative for dysuria.  Musculoskeletal: Negative for back pain and joint pain.  Skin: Negative for rash.  Neurological: Negative for tingling, focal weakness, weakness and headaches.   Objective:  Physical Exam: Filed Vitals:   07/19/15 1000  BP: 132/89  Pulse: 81  Temp: 98 F (36.7 C)  TempSrc: Oral  Weight: 192 lb 12.8 oz (87.454 kg)  SpO2: 98%   GENERAL- alert, co-operative, appears as stated age, NAD. HEENT- Oral mucosa moist, no oropharyngeal erythema, no cervical lymphadenopathy CARDIAC- RRR, no murmurs, rubs or gallops. RESP- CTAB, no wheezes or crackles. NEURO- 5/5 strength in lower extremities b/l, patellar  and achilles reflexes 2+ b/l, no tenderness to palpation in right calf EXTREMITIES- symmetric, no pedal edema. SKIN- Warm, dry, No rash or lesion. PSYCH- Normal mood and affect, appropriate thought content and speech.  Assessment & Plan:   In addition to Nephrology referral for his CKD and hypoaldosteronism, patient is 74 y/o with >40 year smoking history and is an active smoker. He would benefit from referral to lung cancer screening program.

## 2015-07-19 NOTE — Assessment & Plan Note (Signed)
Weight improved on previous visits. States his diet consists mostly of chicken, bologna, Elleen Coulibaly, bread, and burgers once weekly. His weight increase may also be related to fluid retention with his CKD and recent history of edema. Also off lasix along with all other medicines x5 days.

## 2015-07-19 NOTE — Assessment & Plan Note (Signed)
>>  ASSESSMENT AND PLAN FOR ATHEROSCLEROSIS OF LEG WITH INTERMITTENT CLAUDICATION, R WRITTEN ON 07/19/2015  3:21 PM BY RICE, Haig Levan, MD  Patient on Pletal , last refilled 06/07/15 on review of pharmacy records. Patient has leg claudication provoked on walking that requires approximately 5-10 minutes of sitting to resolve. No history of invasive vascular procedures. No evidence of tenderness to palpation in calves, no tortuous veins, no skin changes, no ulcers, sensation intact to toes bilaterally. Consider Pletal  as possible contributor to leg swelling if symptoms persist after gabapentin  is discontinued.  - Reordered Pletal  100mg  BID

## 2015-07-19 NOTE — Assessment & Plan Note (Signed)
BP 132/89 today, at goal. May consider need for continued beta-blocker in patient with recurrent hyperkalemia who has BP and HR at goal. Currently on only 20mg  lasix daily but volume status is appropriate on this dose and should check metabolic panel when patient does not have a 5 day history of noncompliance.

## 2015-07-19 NOTE — Assessment & Plan Note (Addendum)
Patient compliant on florinef and lasix, until running out of all his medications 5 days ago. Has been hospitalized multiple times in the past for hyperkalemia with cardiac changes. Currently asymptomatic without weakness, chest pain, SOB, or palpitations. Patient was referred to nephrology at her last clinic visit but has not been able to make it to the appointment because of transportation issues. Now has bus access for transportation assistance to medical appointments. Patient appears at/near euvolemic on examination today.  - Repeat basic metabolic panel in clinic today - New referral to nephrology - Continue with Florinef and Lasix  BMET    Component Value Date/Time   NA 140 07/19/2015 1056   K 5.4* 07/19/2015 1056   CL 109 07/19/2015 1056   CO2 26 07/19/2015 1056   GLUCOSE 108* 07/19/2015 1056   BUN 24* 07/19/2015 1056   CREATININE 1.49* 07/19/2015 1056   CREATININE 1.61* 02/06/2015 1042   CALCIUM 9.3 07/19/2015 1056   GFRNONAA 44* 07/19/2015 1056   GFRNONAA 37* 10/20/2014 0935   GFRAA 51* 07/19/2015 1056   GFRAA 43* 10/20/2014 0935   Potassium 5.4 after 5 days without medications. Patient asymptomatic of any chest pain, SOB, palpitations. Cardiac physical exam unremarkable. No acute indications for additional workup, patient should return to normal values with resuming his medication.

## 2015-07-19 NOTE — Assessment & Plan Note (Signed)
Previous history of R 5th toe burning pain worst at night. Pain has resolved since 02/06/15 office visit, where vitamin B-12 was monitored and gabapentin 100mg  TID started. B-12 was WNL at 290 not on currently supplementation. New complaint of right lower extremity edema during the interval may be secondary to gabapentin. May also be related to peripheral vascular disease.  - Stop gabapentin 100mg  TID - Follow up at approximately 2 months for review of neuropathic pain control, peripheral edema

## 2015-07-19 NOTE — Assessment & Plan Note (Signed)
BP Readings from Last 3 Encounters:  07/18/15 132/89  02/06/15 131/75  11/07/14 150/73             BP at goal. May consider need for continued beta-blocker in patient with recurrent hyperkalemia who has BP and HR at goal. Currently on only 20mg  lasix daily but volume status is appropriate on this dose and should check metabolic panel when patient does not have a 5 day history of noncompliance.

## 2015-07-19 NOTE — Assessment & Plan Note (Signed)
Patient on Pletal, last refilled 06/07/15 on review of pharmacy records. Patient has leg claudication provoked on walking that requires approximately 5-10 minutes of sitting to resolve. No history of invasive vascular procedures. No evidence of tenderness to palpation in calves, no tortuous veins, no skin changes, no ulcers, sensation intact to toes bilaterally. Consider Pletal as possible contributor to leg swelling if symptoms persist after gabapentin is discontinued.  - Reordered Pletal 100mg  BID

## 2015-07-25 NOTE — Progress Notes (Signed)
Internal Medicine Clinic Attending  I saw and evaluated the patient.  I personally confirmed the key portions of the history and exam documented by Dr. Rice and I reviewed pertinent patient test results.  The assessment, diagnosis, and plan were formulated together and I agree with the documentation in the resident's note.  

## 2015-08-16 ENCOUNTER — Encounter: Payer: Medicare Other | Admitting: Acute Care

## 2015-08-16 ENCOUNTER — Other Ambulatory Visit: Payer: Self-pay | Admitting: Acute Care

## 2015-08-16 DIAGNOSIS — F17210 Nicotine dependence, cigarettes, uncomplicated: Secondary | ICD-10-CM

## 2015-08-23 ENCOUNTER — Ambulatory Visit (INDEPENDENT_AMBULATORY_CARE_PROVIDER_SITE_OTHER): Payer: Medicare Other | Admitting: Acute Care

## 2015-08-23 ENCOUNTER — Ambulatory Visit (INDEPENDENT_AMBULATORY_CARE_PROVIDER_SITE_OTHER)
Admission: RE | Admit: 2015-08-23 | Discharge: 2015-08-23 | Disposition: A | Discharge status: Home / Self Care | Payer: Medicare Other | Source: Ambulatory Visit | Attending: Acute Care | Admitting: Acute Care

## 2015-08-23 ENCOUNTER — Telehealth: Payer: Self-pay | Admitting: Acute Care

## 2015-08-23 ENCOUNTER — Encounter: Payer: Self-pay | Admitting: Acute Care

## 2015-08-23 DIAGNOSIS — F17210 Nicotine dependence, cigarettes, uncomplicated: Secondary | ICD-10-CM

## 2015-08-23 DIAGNOSIS — Z87891 Personal history of nicotine dependence: Secondary | ICD-10-CM | POA: Diagnosis not present

## 2015-08-23 NOTE — Progress Notes (Signed)
Shared Decision Making Visit Lung Cancer Screening Program [phone])   Eligibility:  Age 74 y.o.  Pack Years Smoking History Calculation 40 pack year smoker (# packs/per year x # years smoked)  Recent History of coughing up blood  no  Unexplained weight loss? no ( >Than 15 pounds within the last 6 months )  Prior History Lung / other cancer no (Diagnosis within the last 5 years already requiring surveillance chest CT Scans).  Smoking Status Current Smoker  Former Smokers: Years since quit:NA  Quit Date:NA  Visit Components:  Discussion included one or more decision making aids. yes  Discussion included risk/benefits of screening. yes  Discussion included potential follow up diagnostic testing for abnormal scans. yes  Discussion included meaning and risk of over diagnosis. yes  Discussion included meaning and risk of False Positives. yes  Discussion included meaning of total radiation exposure. yes  Counseling Included:  Importance of adherence to annual lung cancer LDCT screening. yes  Impact of comorbidities on ability to participate in the program. yes  Ability and willingness to under diagnostic treatment. yes  Smoking Cessation Counseling:  Current Smokers:   Discussed importance of smoking cessation. yes  Information about tobacco cessation classes and interventions provided to patient. yes  Patient provided with "ticket" for LDCT Scan. yes  Symptomatic Patient. no  Counseling:NA  Diagnosis Code: Tobacco Use Z72.0  Asymptomatic Patient yes  Counseling (Intermediate counseling: > three minutes counseling) [id number]  Former Smokers:   Discussed the importance of maintaining cigarette abstinence. NA  Diagnosis Code: Personal History of Nicotine Dependence. [id number]  Information about tobacco cessation classes and interventions provided to patient. As above  Patient provided with "ticket" for LDCT Scan. yes  Written Order for Lung Cancer Screening  with LDCT placed in Epic. Yes (CT Chest Lung Cancer Screening Low Dose W/O CM) [id number] Z12.2-Screening of respiratory organs Z87.891-Personal history of nicotine dependence  I spent 15 minutes of face to face time with Derrick Hendrix discussing the Lung Cancer Screening program. We viewed a power point together that discussed the topics noted above. We paused at intervals to allow for questions to be asked and answered, to ensure understanding of each concept. We discussed that the single most powerful thing he could do to decrease his risk of lung cancer was to quit smoking. He states he does want to quit smoking, and is interested in using the patches to help.I have given him the " Be stronger than your excuses" card with the [place] number for free nicotine replacement therapy.I have told him to call me if he has any trouble getting the patches, and I have told him I will help him. He has my card and contact information. He did note that a big trigger for him is smelling smoke, and wanting a cigarette. We discussed ways to avoid areas where there is smoke to help him meet his smoke free goal.I gave him a copy of the power point we viewed together, and told him to call me if he has any questions. [facility] transportation is taking him to the scan appointment. We confirmed time and location. I told Derrick Hendrix that I will call him with the results of his scan.He verbalized understanding of all of the above and had no further questions upon leaving the office.   Magdalen Spatz, NP

## 2015-08-23 NOTE — Telephone Encounter (Signed)
I called Derrick Hendrix to give him the results of the LDCT. I explained that the scan was read as a Lung RADS 2, which means the nodules in his lungs are benign in appearance and behavior. I explained that the recommendation was that he have another annual scan in 12 months.He verbalized understanding. I told him we would call in 12 months to schedule him for his next scan. He verbalized understanding.He has my contact information in te event her has any questions in the future. I reminded him to call the [place] number on the back of the card I gave him to get nicotine patches to help him to quit smoking.He said he would.

## 2015-11-20 ENCOUNTER — Other Ambulatory Visit: Payer: Self-pay

## 2015-11-20 MED ORDER — CILOSTAZOL 100 MG PO TABS: 100.0000 mg | ORAL_TABLET | Freq: Two times a day (BID) | ORAL | Status: DC

## 2015-12-11 ENCOUNTER — Other Ambulatory Visit: Payer: Self-pay | Admitting: Internal Medicine

## 2015-12-12 NOTE — Telephone Encounter (Signed)
Needs appt with Dr. Benjamine Mola asap.  Thanks.

## 2016-01-11 ENCOUNTER — Other Ambulatory Visit: Payer: Self-pay | Admitting: *Deleted

## 2016-01-11 DIAGNOSIS — R809 Proteinuria, unspecified: Secondary | ICD-10-CM | POA: Diagnosis not present

## 2016-01-11 DIAGNOSIS — I129 Hypertensive chronic kidney disease with stage 1 through stage 4 chronic kidney disease, or unspecified chronic kidney disease: Secondary | ICD-10-CM | POA: Diagnosis not present

## 2016-01-11 DIAGNOSIS — N2581 Secondary hyperparathyroidism of renal origin: Secondary | ICD-10-CM | POA: Diagnosis not present

## 2016-01-11 DIAGNOSIS — I10 Essential (primary) hypertension: Secondary | ICD-10-CM

## 2016-01-11 DIAGNOSIS — D631 Anemia in chronic kidney disease: Secondary | ICD-10-CM | POA: Diagnosis not present

## 2016-01-11 DIAGNOSIS — N183 Chronic kidney disease, stage 3 (moderate): Secondary | ICD-10-CM | POA: Diagnosis not present

## 2016-01-11 MED ORDER — AMLODIPINE BESYLATE 10 MG PO TABS: 10.0000 mg | ORAL_TABLET | Freq: Every day | ORAL | Status: DC

## 2016-01-11 NOTE — Telephone Encounter (Signed)
Last appt 07/18/16.

## 2016-01-17 ENCOUNTER — Other Ambulatory Visit: Payer: Self-pay | Admitting: Nephrology

## 2016-01-17 DIAGNOSIS — N2581 Secondary hyperparathyroidism of renal origin: Secondary | ICD-10-CM

## 2016-01-17 DIAGNOSIS — I129 Hypertensive chronic kidney disease with stage 1 through stage 4 chronic kidney disease, or unspecified chronic kidney disease: Secondary | ICD-10-CM

## 2016-01-17 DIAGNOSIS — N183 Chronic kidney disease, stage 3 unspecified: Secondary | ICD-10-CM

## 2016-01-25 ENCOUNTER — Ambulatory Visit
Admission: RE | Admit: 2016-01-25 | Discharge: 2016-01-25 | Disposition: A | Discharge status: Home / Self Care | Payer: Medicare Other | Source: Ambulatory Visit | Attending: Nephrology | Admitting: Nephrology

## 2016-01-25 DIAGNOSIS — I129 Hypertensive chronic kidney disease with stage 1 through stage 4 chronic kidney disease, or unspecified chronic kidney disease: Secondary | ICD-10-CM

## 2016-01-25 DIAGNOSIS — N183 Chronic kidney disease, stage 3 unspecified: Secondary | ICD-10-CM

## 2016-01-25 DIAGNOSIS — N2581 Secondary hyperparathyroidism of renal origin: Secondary | ICD-10-CM

## 2016-02-11 ENCOUNTER — Other Ambulatory Visit: Payer: Self-pay | Admitting: Internal Medicine

## 2016-03-02 ENCOUNTER — Inpatient Hospital Stay (HOSPITAL_COMMUNITY)
Admission: EM | Admit: 2016-03-02 | Discharge: 2016-03-05 | DRG: 041 | Disposition: A | Discharge status: Home / Self Care | Payer: Medicare Other | Attending: Student in an Organized Health Care Education/Training Program | Admitting: Student in an Organized Health Care Education/Training Program

## 2016-03-02 ENCOUNTER — Emergency Department (HOSPITAL_COMMUNITY): Payer: Medicare Other

## 2016-03-02 ENCOUNTER — Encounter (HOSPITAL_COMMUNITY): Payer: Self-pay

## 2016-03-02 DIAGNOSIS — Z79899 Other long term (current) drug therapy: Secondary | ICD-10-CM

## 2016-03-02 DIAGNOSIS — N183 Chronic kidney disease, stage 3 (moderate): Secondary | ICD-10-CM | POA: Diagnosis present

## 2016-03-02 DIAGNOSIS — R42 Dizziness and giddiness: Secondary | ICD-10-CM

## 2016-03-02 DIAGNOSIS — I63532 Cerebral infarction due to unspecified occlusion or stenosis of left posterior cerebral artery: Principal | ICD-10-CM | POA: Diagnosis present

## 2016-03-02 DIAGNOSIS — I129 Hypertensive chronic kidney disease with stage 1 through stage 4 chronic kidney disease, or unspecified chronic kidney disease: Secondary | ICD-10-CM | POA: Diagnosis not present

## 2016-03-02 DIAGNOSIS — R55 Syncope and collapse: Secondary | ICD-10-CM | POA: Diagnosis not present

## 2016-03-02 DIAGNOSIS — I6523 Occlusion and stenosis of bilateral carotid arteries: Secondary | ICD-10-CM | POA: Diagnosis present

## 2016-03-02 DIAGNOSIS — E2740 Unspecified adrenocortical insufficiency: Secondary | ICD-10-CM | POA: Diagnosis not present

## 2016-03-02 DIAGNOSIS — R351 Nocturia: Secondary | ICD-10-CM | POA: Diagnosis present

## 2016-03-02 DIAGNOSIS — F17210 Nicotine dependence, cigarettes, uncomplicated: Secondary | ICD-10-CM | POA: Diagnosis present

## 2016-03-02 DIAGNOSIS — Z8673 Personal history of transient ischemic attack (TIA), and cerebral infarction without residual deficits: Secondary | ICD-10-CM

## 2016-03-02 DIAGNOSIS — I639 Cerebral infarction, unspecified: Secondary | ICD-10-CM | POA: Diagnosis present

## 2016-03-02 DIAGNOSIS — N179 Acute kidney failure, unspecified: Secondary | ICD-10-CM | POA: Diagnosis present

## 2016-03-02 DIAGNOSIS — I739 Peripheral vascular disease, unspecified: Secondary | ICD-10-CM | POA: Diagnosis present

## 2016-03-02 DIAGNOSIS — R531 Weakness: Secondary | ICD-10-CM | POA: Diagnosis not present

## 2016-03-02 DIAGNOSIS — R270 Ataxia, unspecified: Secondary | ICD-10-CM | POA: Diagnosis present

## 2016-03-02 DIAGNOSIS — Z886 Allergy status to analgesic agent status: Secondary | ICD-10-CM

## 2016-03-02 DIAGNOSIS — J439 Emphysema, unspecified: Secondary | ICD-10-CM | POA: Diagnosis not present

## 2016-03-02 DIAGNOSIS — E785 Hyperlipidemia, unspecified: Secondary | ICD-10-CM | POA: Diagnosis present

## 2016-03-02 DIAGNOSIS — R404 Transient alteration of awareness: Secondary | ICD-10-CM | POA: Diagnosis not present

## 2016-03-02 LAB — COMPREHENSIVE METABOLIC PANEL
ALT: 12 U/L — ABNORMAL LOW (ref 17–63)
ANION GAP: 14 (ref 5–15)
AST: 18 U/L (ref 15–41)
Albumin: 3.9 g/dL (ref 3.5–5.0)
Alkaline Phosphatase: 93 U/L (ref 38–126)
BILIRUBIN TOTAL: 0.6 mg/dL (ref 0.3–1.2)
BUN: 36 mg/dL — ABNORMAL HIGH (ref 6–20)
CO2: 20 mmol/L — ABNORMAL LOW (ref 22–32)
Calcium: 9 mg/dL (ref 8.9–10.3)
Chloride: 104 mmol/L (ref 101–111)
Creatinine, Ser: 2.08 mg/dL — ABNORMAL HIGH (ref 0.61–1.24)
GFR, EST AFRICAN AMERICAN: 34 mL/min — AB (ref >60)
GFR, EST NON AFRICAN AMERICAN: 29 mL/min — AB (ref >60)
Glucose, Bld: 108 mg/dL — ABNORMAL HIGH (ref 65–99)
POTASSIUM: 4.9 mmol/L (ref 3.5–5.1)
Sodium: 138 mmol/L (ref 135–145)
TOTAL PROTEIN: 7.8 g/dL (ref 6.5–8.1)

## 2016-03-02 LAB — URINALYSIS, ROUTINE W REFLEX MICROSCOPIC
BILIRUBIN URINE: NEGATIVE
Glucose, UA: NEGATIVE mg/dL
Hgb urine dipstick: NEGATIVE
Ketones, ur: NEGATIVE mg/dL
Leukocytes, UA: NEGATIVE
NITRITE: NEGATIVE
PROTEIN: NEGATIVE mg/dL
SPECIFIC GRAVITY, URINE: 1.011 (ref 1.005–1.030)
pH: 5.5 (ref 5.0–8.0)

## 2016-03-02 LAB — I-STAT TROPONIN, ED: Troponin i, poc: 0.01 ng/mL (ref 0.00–0.08)

## 2016-03-02 LAB — CBC WITH DIFFERENTIAL/PLATELET
BASOS ABS: 0 10*3/uL (ref 0.0–0.1)
Basophils Relative: 1 %
EOS PCT: 3 %
Eosinophils Absolute: 0.2 10*3/uL (ref 0.0–0.7)
HEMATOCRIT: 38.7 % — AB (ref 39.0–52.0)
Hemoglobin: 12.1 g/dL — ABNORMAL LOW (ref 13.0–17.0)
LYMPHS PCT: 18 %
Lymphs Abs: 1.2 10*3/uL (ref 0.7–4.0)
MCH: 28.2 pg (ref 26.0–34.0)
MCHC: 31.3 g/dL (ref 30.0–36.0)
MCV: 90.2 fL (ref 78.0–100.0)
Monocytes Absolute: 0.6 10*3/uL (ref 0.1–1.0)
Monocytes Relative: 8 %
NEUTROS ABS: 5 10*3/uL (ref 1.7–7.7)
Neutrophils Relative %: 70 %
PLATELETS: 310 10*3/uL (ref 150–400)
RBC: 4.29 MIL/uL (ref 4.22–5.81)
RDW: 14.9 % (ref 11.5–15.5)
WBC: 7.1 10*3/uL (ref 4.0–10.5)

## 2016-03-02 MED ORDER — SODIUM CHLORIDE 0.9 % IV SOLN
INTRAVENOUS | Status: DC
  Administered 2016-03-02: 20:00:00 via INTRAVENOUS

## 2016-03-02 MED ORDER — SODIUM CHLORIDE 0.9 % IV BOLUS (SEPSIS)
500.0000 mL | Freq: Once | INTRAVENOUS | Status: AC
  Administered 2016-03-02: 500 mL via INTRAVENOUS

## 2016-03-02 NOTE — ED Notes (Signed)
Dr. Zackowski at bedside  

## 2016-03-02 NOTE — ED Notes (Signed)
Per EMS: Pt complaining of dizziness and near-syncope x 3/4 days. States he becomes dizzy when standing. Denies any SOB or chest pain. Pt states he is "Currently being evaluated for dialysis." Pt a/o x 4.

## 2016-03-02 NOTE — ED Notes (Signed)
Pt transported to radiology.

## 2016-03-02 NOTE — ED Provider Notes (Signed)
CSN: [id number]     Arrival date & time 03/02/16  1723 History   First MD Initiated Contact with Patient 03/02/16 1729     Chief Complaint  Patient presents with  . Dizziness     (Consider location/radiation/quality/duration/timing/severity/associated sxs/prior Treatment) Patient is a 75 y.o. male presenting with dizziness. The history is provided by the patient.  Dizziness Associated symptoms: no chest pain, no shortness of breath and no weakness    Complaint for 3-4 days of some dizziness no vertigo. Feeling lightheaded feeling maybe would pass out. Patient denies any numbness or weakness or headache. No neck pain no back pain or shortness of breath no chest pain no abdominal pain no nausea or vomiting. Patient currently does not have a primary care doctor. Past Medical History  Diagnosis Date  . Hypertension   . Headache(784.0)   . Hyperkalemia 08/2014  . Stroke ([place])     hx of PAD  . Chronic kidney disease     HX OF CKD 3   History reviewed. No pertinent past surgical history. Family History  Problem Relation Age of Onset  . Hypertension Mother     died age 55  . Diabetes Mother    Social History  Substance Use Topics  . Smoking status: Current Every Day Smoker -- 1.00 packs/day for 40 years    Types: Cigarettes  . Smokeless tobacco: Never Used     Comment: but hoping to quit one day  1/2PPD  . Alcohol Use: 5.4 oz/week    9 Cans of beer per week     Comment: Liquor once a week.    Review of Systems  Constitutional: Negative for fever.  HENT: Negative for congestion.   Eyes: Negative for visual disturbance.  Respiratory: Negative for shortness of breath.   Cardiovascular: Negative for chest pain.  Gastrointestinal: Negative for abdominal pain.  Genitourinary: Negative for dysuria.  Musculoskeletal: Negative for back pain.  Skin: Negative for rash.  Neurological: Positive for dizziness and light-headedness. Negative for syncope, weakness and numbness.   Hematological: Does not bruise/bleed easily.  Psychiatric/Behavioral: Negative for confusion.      Allergies  Aspirin  Home Medications   Prior to Admission medications   Medication Sig Start Date End Date Taking? Authorizing Provider  amLODipine (NORVASC) 10 MG tablet Take 1 tablet (10 mg total) by mouth daily. 01/11/16  Yes [name], MD  cilostazol (PLETAL) 100 MG tablet Take 1 tablet (100 mg total) by mouth 2 (two) times daily. 11/20/15  Yes [name], MD  fludrocortisone (FLORINEF) 0.1 MG tablet Take 1 tablet (0.1 mg total) by mouth daily. 07/19/15  Yes [name], MD  furosemide (LASIX) 40 MG tablet TAKE 0.5 TABLETS (20 MG TOTAL) BY MOUTH DAILY. 12/12/15  Yes Sid Falcon, MD  hydrochlorothiazide (HYDRODIURIL) 12.5 MG tablet Take 12.5 mg by mouth daily. 02/13/16  Yes Historical Provider, MD  ipratropium (ATROVENT) 0.06 % nasal spray Place 2 sprays into the nose 3 (three) times daily. 02/06/15  Yes Luan Moore, MD  nitroGLYCERIN (NITROSTAT) 0.4 MG SL tablet Place 1 tablet (0.4 mg total) under the tongue every 5 (five) minutes x 3 doses as needed for chest pain. 11/29/14  Yes Luan Moore, MD  polyethylene glycol [facility]) packet Take 17 g by mouth daily. Patient taking differently: Take 17 g by mouth daily as needed for mild constipation.  11/29/14  Yes Luan Moore, MD  simvastatin (ZOCOR) 20 MG tablet TAKE 1 TABLET (20 MG TOTAL) BY MOUTH  DAILY. 02/13/16  Yes [name], MD  terazosin (HYTRIN) 1 MG capsule TAKE 1 CAPSULE (1 MG TOTAL) BY MOUTH AT BEDTIME. 02/13/16  Yes [name], MD   BP 160/80 mmHg  Pulse 135  Resp 18  SpO2 93% Physical Exam  Constitutional: He is oriented to person, place, and time. He appears well-developed and well-nourished. No distress.  HENT:  Head: Normocephalic and atraumatic.  Eyes: Conjunctivae and EOM are normal. Pupils are equal, round, and reactive to light.  Neck: Normal range of motion. Neck supple.   Cardiovascular: Normal rate, regular rhythm and normal heart sounds.   No murmur heard. Pulmonary/Chest: Effort normal and breath sounds normal. No respiratory distress.  Abdominal: Soft. Bowel sounds are normal. There is no tenderness.  Musculoskeletal: Normal range of motion.  Neurological: He is alert and oriented to person, place, and time. No cranial nerve deficit. He exhibits normal muscle tone. Coordination normal.  Skin: Skin is warm.  Nursing note and vitals reviewed.   ED Course  Procedures (including critical care time) Labs Review Labs Reviewed  COMPREHENSIVE METABOLIC PANEL - Abnormal; Notable for the following:    CO2 20 (*)    Glucose, Bld 108 (*)    BUN 36 (*)    Creatinine, Ser 2.08 (*)    ALT 12 (*)    GFR calc non Af Amer 29 (*)    GFR calc Af Amer 34 (*)    All other components within normal limits  CBC WITH DIFFERENTIAL/PLATELET - Abnormal; Notable for the following:    Hemoglobin 12.1 (*)    HCT 38.7 (*)    All other components within normal limits  URINALYSIS, ROUTINE W REFLEX MICROSCOPIC (NOT AT [facility])  Randolm Idol, ED    Imaging Review Dg Chest 2 View  03/02/2016  CLINICAL DATA:  Dizziness and near syncope for 4 days. Dizzy when standing. History of hypertension and stroke. EXAM: CHEST  2 VIEW COMPARISON:  06/27/2014 FINDINGS: Mild hyperinflation suggesting emphysema. No focal airspace disease or consolidation in the lungs. Normal heart size and pulmonary vascularity. Nodular opacities projected over the mid lungs consistent with prominent nipple shadows. No blunting of costophrenic angles. No pneumothorax. IMPRESSION: Mild emphysematous changes in the lungs. No evidence of active pulmonary disease. Electronically Signed   By: Lucienne Capers M.D.   On: 03/02/2016 18:45   Ct Head Wo Contrast  03/02/2016  CLINICAL DATA:  Dizziness EXAM: CT HEAD WITHOUT CONTRAST TECHNIQUE: Contiguous axial images were obtained from the base of the skull through the  vertex without intravenous contrast. COMPARISON:  02/27/2012 FINDINGS: Bony calvarium is intact. Scattered areas of decreased attenuation are noted in the deep white matter bilaterally consistent with chronic white matter ischemic change. There is a 2.2 x 02.1 cm rounded hypodensity in the posterior aspect of the left cerebellar hemisphere consistent with an acute infarct. No other focal abnormality is seen. IMPRESSION: Focal infarct in the posterior aspect of the left cerebellum as described. Electronically Signed   By: Inez Catalina M.D.   On: 03/02/2016 20:51   I have personally reviewed and evaluated these images and lab results as part of my medical decision-making.   EKG Interpretation   Date/Time:  Sunday March 02 2016 18:02:38 EDT Ventricular Rate:  91 PR Interval:  154 QRS Duration: 98 QT Interval:  360 QTC Calculation: 443 R Axis:   70 Text Interpretation:  Sinus rhythm Probable left ventricular hypertrophy  Anterior ST elevation, probably due to LVH Baseline wander in  lead(s) I  III aVL V4 V5 ST elevation, consider early repolarization Confirmed by  Catrena Vari  MD, Renu Asby [phone]) on 03/02/2016 6:19:48 PM      MDM   Final diagnoses:  Dizziness  Near syncope  Patient with concerns for feeling dizzy for the past 3-4 days. No vertigo or room spinning. No chest pain no shortness of breath no headache. Also has felt like maybe he may pass out a few times. Has not passed out. Patient is currently being evaluated for possible need for dialysis. Workup here today without any significant findings including negative except for evidence of acute infarct left cerebellar hemisphere. We'll make arrangements to have him admitted and consultation with the neural hospitalist. Chest x-ray negative for pneumonia. EKG without any significant arrhythmias. Labs without any significant abnormalities. Including troponin which was negative.    Fredia Sorrow, MD 03/02/16 [phone]

## 2016-03-03 ENCOUNTER — Inpatient Hospital Stay (HOSPITAL_COMMUNITY): Payer: Medicare Other

## 2016-03-03 ENCOUNTER — Encounter (HOSPITAL_COMMUNITY): Payer: Self-pay | Admitting: Internal Medicine

## 2016-03-03 DIAGNOSIS — E2749 Other adrenocortical insufficiency: Secondary | ICD-10-CM

## 2016-03-03 DIAGNOSIS — I63532 Cerebral infarction due to unspecified occlusion or stenosis of left posterior cerebral artery: Secondary | ICD-10-CM | POA: Diagnosis present

## 2016-03-03 DIAGNOSIS — I120 Hypertensive chronic kidney disease with stage 5 chronic kidney disease or end stage renal disease: Secondary | ICD-10-CM

## 2016-03-03 DIAGNOSIS — I6789 Other cerebrovascular disease: Secondary | ICD-10-CM | POA: Diagnosis not present

## 2016-03-03 DIAGNOSIS — E785 Hyperlipidemia, unspecified: Secondary | ICD-10-CM

## 2016-03-03 DIAGNOSIS — I639 Cerebral infarction, unspecified: Secondary | ICD-10-CM | POA: Diagnosis present

## 2016-03-03 DIAGNOSIS — I739 Peripheral vascular disease, unspecified: Secondary | ICD-10-CM | POA: Diagnosis present

## 2016-03-03 DIAGNOSIS — N183 Chronic kidney disease, stage 3 (moderate): Secondary | ICD-10-CM

## 2016-03-03 DIAGNOSIS — I129 Hypertensive chronic kidney disease with stage 1 through stage 4 chronic kidney disease, or unspecified chronic kidney disease: Secondary | ICD-10-CM | POA: Diagnosis present

## 2016-03-03 DIAGNOSIS — R351 Nocturia: Secondary | ICD-10-CM | POA: Diagnosis present

## 2016-03-03 DIAGNOSIS — Z8673 Personal history of transient ischemic attack (TIA), and cerebral infarction without residual deficits: Secondary | ICD-10-CM | POA: Diagnosis not present

## 2016-03-03 DIAGNOSIS — E2740 Unspecified adrenocortical insufficiency: Secondary | ICD-10-CM | POA: Diagnosis present

## 2016-03-03 DIAGNOSIS — I6523 Occlusion and stenosis of bilateral carotid arteries: Secondary | ICD-10-CM | POA: Diagnosis present

## 2016-03-03 DIAGNOSIS — N179 Acute kidney failure, unspecified: Secondary | ICD-10-CM

## 2016-03-03 DIAGNOSIS — R270 Ataxia, unspecified: Secondary | ICD-10-CM | POA: Diagnosis not present

## 2016-03-03 DIAGNOSIS — R51 Headache: Secondary | ICD-10-CM | POA: Diagnosis not present

## 2016-03-03 DIAGNOSIS — F17210 Nicotine dependence, cigarettes, uncomplicated: Secondary | ICD-10-CM | POA: Diagnosis not present

## 2016-03-03 DIAGNOSIS — N189 Chronic kidney disease, unspecified: Secondary | ICD-10-CM

## 2016-03-03 DIAGNOSIS — Z886 Allergy status to analgesic agent status: Secondary | ICD-10-CM | POA: Diagnosis not present

## 2016-03-03 DIAGNOSIS — R42 Dizziness and giddiness: Secondary | ICD-10-CM | POA: Diagnosis not present

## 2016-03-03 DIAGNOSIS — I63432 Cerebral infarction due to embolism of left posterior cerebral artery: Secondary | ICD-10-CM | POA: Diagnosis not present

## 2016-03-03 DIAGNOSIS — Z79899 Other long term (current) drug therapy: Secondary | ICD-10-CM | POA: Diagnosis not present

## 2016-03-03 LAB — LIPID PANEL
CHOL/HDL RATIO: 3.6 ratio
Cholesterol: 149 mg/dL (ref 0–200)
HDL: 41 mg/dL (ref >40)
LDL CALC: 95 mg/dL (ref 0–99)
Triglycerides: 65 mg/dL (ref <150)
VLDL: 13 mg/dL (ref 0–40)

## 2016-03-03 LAB — BASIC METABOLIC PANEL
Anion gap: 14 (ref 5–15)
BUN: 30 mg/dL — AB (ref 6–20)
CHLORIDE: 102 mmol/L (ref 101–111)
CO2: 22 mmol/L (ref 22–32)
Calcium: 9.1 mg/dL (ref 8.9–10.3)
Creatinine, Ser: 1.68 mg/dL — ABNORMAL HIGH (ref 0.61–1.24)
GFR calc non Af Amer: 38 mL/min — ABNORMAL LOW (ref >60)
GFR, EST AFRICAN AMERICAN: 44 mL/min — AB (ref >60)
Glucose, Bld: 109 mg/dL — ABNORMAL HIGH (ref 65–99)
POTASSIUM: 4.2 mmol/L (ref 3.5–5.1)
SODIUM: 138 mmol/L (ref 135–145)

## 2016-03-03 MED ORDER — SODIUM CHLORIDE 0.9 % IV SOLN
INTRAVENOUS | Status: AC
  Administered 2016-03-03 (×2): via INTRAVENOUS

## 2016-03-03 MED ORDER — FLUDROCORTISONE ACETATE 0.1 MG PO TABS
0.1000 mg | ORAL_TABLET | Freq: Every day | ORAL | Status: DC
  Administered 2016-03-03 – 2016-03-05 (×3): 0.1 mg via ORAL
  Filled 2016-03-03 (×6): qty 1

## 2016-03-03 MED ORDER — CLOPIDOGREL BISULFATE 75 MG PO TABS
75.0000 mg | ORAL_TABLET | Freq: Every day | ORAL | Status: DC
  Administered 2016-03-03 – 2016-03-05 (×3): 75 mg via ORAL
  Filled 2016-03-03 (×3): qty 1

## 2016-03-03 MED ORDER — ACETAMINOPHEN 500 MG PO TABS
1000.0000 mg | ORAL_TABLET | Freq: Four times a day (QID) | ORAL | Status: DC | PRN
  Filled 2016-03-03: qty 2

## 2016-03-03 MED ORDER — ATORVASTATIN CALCIUM 40 MG PO TABS
40.0000 mg | ORAL_TABLET | Freq: Every day | ORAL | Status: DC
Start: 2016-03-03 — End: 2016-03-05
  Administered 2016-03-03 – 2016-03-04 (×2): 40 mg via ORAL
  Filled 2016-03-03 (×2): qty 1

## 2016-03-03 MED ORDER — HEPARIN SODIUM (PORCINE) 5000 UNIT/ML IJ SOLN
5000.0000 [IU] | Freq: Three times a day (TID) | INTRAMUSCULAR | Status: DC
  Administered 2016-03-03 – 2016-03-05 (×7): 5000 [IU] via SUBCUTANEOUS
  Filled 2016-03-03 (×7): qty 1

## 2016-03-03 MED ORDER — POLYETHYLENE GLYCOL 3350 17 G PO PACK
17.0000 g | PACK | Freq: Every day | ORAL | Status: DC | PRN
  Filled 2016-03-03: qty 1

## 2016-03-03 MED ORDER — BACLOFEN 10 MG PO TABS
10.0000 mg | ORAL_TABLET | Freq: Three times a day (TID) | ORAL | Status: DC
  Administered 2016-03-03 – 2016-03-05 (×7): 10 mg via ORAL
  Filled 2016-03-03 (×9): qty 1

## 2016-03-03 NOTE — ED Notes (Signed)
Attempted report for the 4th time.  Spoke with Agricultural consultant, this RN unable to give report at this time.

## 2016-03-03 NOTE — ED Notes (Signed)
1st attempt to call report 

## 2016-03-03 NOTE — Progress Notes (Addendum)
STROKE TEAM PROGRESS NOTE   HISTORY OF PRESENT ILLNESS Derrick Hendrix is a 75 y.o. male with multiple stroke risk factors who is not antiplatelets because he gets nosebleeds from them. For the last 2-3 days has had headache and gait ataxia and this morning became very lightheaded and his neighbor called EMS. Ct in the ED shows a left cerebellar stroke that is subacute and c/w his clinical sx and hx. Denies CP or SOB. Denies non compliance with meds. He is a smoker. He reports severe nocturia to the point that he has a difficult time sleeping.   LKW: 2-3 days ago tpa given?: no   SUBJECTIVE (INTERVAL HISTORY) His RN is at the bedside.  Overall he feels his condition is gradually improving.     OBJECTIVE Temp:  [97.7 F (36.5 C)] 97.7 F (36.5 C) (03/20 0208) Pulse Rate:  [63-135] 88 (03/20 0700) Cardiac Rhythm:  [-]  Resp:  [11-26] 15 (03/20 0700) BP: (129-160)/(79-98) 142/84 mmHg (03/20 0700) SpO2:  [91 %-99 %] 97 % (03/20 0700)  CBC:  Recent Labs Lab 03/02/16 1815  WBC 7.1  NEUTROABS 5.0  HGB 12.1*  HCT 38.7*  MCV 90.2  PLT 99991111    Basic Metabolic Panel:  Recent Labs Lab 03/02/16 1815 03/03/16 0521  NA 138 138  K 4.9 4.2  CL 104 102  CO2 20* 22  GLUCOSE 108* 109*  BUN 36* 30*  CREATININE 2.08* 1.68*  CALCIUM 9.0 9.1    Lipid Panel:    Component Value Date/Time   CHOL 149 03/03/2016 0521   TRIG 65 03/03/2016 0521   HDL 41 03/03/2016 0521   CHOLHDL 3.6 03/03/2016 0521   VLDL 13 03/03/2016 0521   LDLCALC 95 03/03/2016 0521   HgbA1c:  Lab Results  Component Value Date   HGBA1C 5.0 06/26/2014   Urine Drug Screen:    Component Value Date/Time   LABOPIA NONE DETECTED 06/26/2014 1552   COCAINSCRNUR NONE DETECTED 06/26/2014 1552   LABBENZ NONE DETECTED 06/26/2014 1552   AMPHETMU NONE DETECTED 06/26/2014 1552   THCU NONE DETECTED 06/26/2014 1552   LABBARB NONE DETECTED 06/26/2014 1552      IMAGING  Dg Chest 2 View 03/02/2016   Mild  emphysematous changes in the lungs. No evidence of active pulmonary disease.     Ct Head Wo Contrast 03/02/2016   Focal infarct in the posterior aspect of the left cerebellum as described.    [name] Head Wo Contrast 03/03/2016    MRI HEAD IMPRESSION:  1. Patchy multifocal ischemic nonhemorrhagic left PICA territory infarcts. No significant mass effect.  2. Generalized cerebral atrophy with moderate chronic microvascular ischemic disease with superimposed remote lacunar infarcts involving the bilateral basal ganglia/corona radiata.   MRA HEAD IMPRESSION:  1. Diminutive left vertebral artery, likely terminating in PICA. Distal left V4 segment/left PICA not visualized, likely occluded.  2. No other large or proximal arterial branch occlusion within the intracranial circulation. No high-grade or correctable stenosis.  3. Mild to moderate smooth narrowing of the supraclinoid left ICA.     PHYSICAL EXAM Pleasant elderly male not in distress. . Afebrile. Head is nontraumatic. Neck is supple without bruit.    Cardiac exam no murmur or gallop. Lungs are clear to auscultation. Distal pulses are well felt.  Neurological Exam ;  Awake  Alert oriented x 3. Normal speech and language.eye movements full without nystagmus.fundi were not visualized. Vision acuity and fields appear normal. Hearing is normal. Palatal movements are normal. Face symmetric.  Tongue midline. Normal strength, tone, reflexes and coordination. Normal sensation. Gait deferred.     ASSESSMENT/PLAN [name] is a 75 y.o. male with history of aspirin intolerance secondary to nosebleeds, hypertension, headaches, hyperkalemia, previous stroke, and chronic kidney disease presenting with dizziness, gait ataxia, and headache. He did not receive IV t-PA due to late presentation.  Stroke:  Dominant  infarcts thrombotic secondary to left PICA occlusion.  Resultant  gait ataxia.  MRI  Patchy multifocal ischemic  nonhemorrhagic left PICA territory infarcts   MRA  Diminutive left vertebral artery. Distal left V4 segment/left PICA not visualized, likely occluded.   Carotid Doppler- Bilateral: 1-39% ICA stenosis. Vertebral artery flow is antegrade.  2D Echo pending  LDL 95  HgbA1c pending  VTE prophylaxis - subcutaneous heparin  Diet Heart Room service appropriate?: Yes; Fluid consistency:: Thin  No antithrombotic prior to admission, now on clopidogrel 75 mg daily  Patient counseled to be compliant with his antithrombotic medications  Ongoing aggressive stroke risk factor management  Therapy recommendations: Pending  Disposition: Pending  Hypertension  Stable  Permissive hypertension (OK if < 220/120) but gradually normalize in 5-7 days  Hyperlipidemia  Home meds:  Zocor 20 mg daily changed to Lipitor 40 mg daily  LDL 95, goal < 70  Continue statin at discharge    Other Stroke Risk Factors  Advanced age  Cigarette smoker,advised to stop smoking  ETOH use  Hx stroke/TIA   Other Active Problems  Chronic kidney disease  Mild anemia  Hospital day # 0  I have personally examined this patient, reviewed notes, independently viewed imaging studies, participated in medical decision making and plan of care. I have made any additions or clarifications directly to the above note. Agree with note above. He presented with gait ataxia, headache and dizziness secondary to left cerebellar infarct etiology likely left posterior inferior cerebellar artery occlusion due to intracranial atherosclerosis. Patient is at risk for neurological worsening, recurrent stroke, TIA needs ongoing stroke evaluation and aggressive risk factor modification.  Antony Contras, MD Medical Director [facility] Pager: [phone] 03/03/2016 10:20 PM     To contact Stroke Continuity provider, please refer to [email or web address] After hours, contact General Neurology

## 2016-03-03 NOTE — H&P (Signed)
Date: 03/03/2016               Patient Name:  Derrick Hendrix MRN: [id number]  DOB: August 02, 1941 Age / Sex: 75 y.o., male   PCP: [name], MD         Medical Service: Internal Medicine Teaching Service         Attending Physician: Dr. Fredia Sorrow, MD    First Contact: Dr. Ignacia Marvel Pager: [phone]  Second Contact: Dr. Julious Oka Pager: [phone]       After Hours (After 5p/  First Contact Pager: [phone]  weekends / holidays): Second Contact Pager: [phone]   Chief Complaint: Dizziness/Gait Instability  History of Present Illness: Mr. Beach is a 75 year old male with a past medical history of HTN, CKD Stage III, Hyporeninemic hypoaldosteronism, PAD, HLD, current smoker presents today with complaints of dizziness, lightheadedness and gait instability. He reports that last night he stood up and became very lightheaded and dizzy. He sat back down with improvement of his symptoms and waited 10-15 minutes before trying to stand up again. He became dizzy and lightheaded again but symptoms were not relieved when sitting down. Symptoms resolved after another 10-15 minutes and he went to sleep. This morning, he again became lightheaded and dizzy, feeling like he was going to pass out with an associated left sided headache that was dull aching in nature and had difficulty ambulating. He reports feeling unsteady on his feet. Never lost consciousness. Denies any room spinning. Denied any weakness, numbness, dysarthria, chest pain, palpitations, shortness of breath, abdominal pain, nausea or vomiting. Reports compliance with his medications at home. Does report smoking 1 PPD at home. Reports history of severe nosebleeds with ASA in the past.   Meds: Current Facility-Administered Medications  Medication Dose Route Frequency Provider Last Rate Last Dose  . clopidogrel (PLAVIX) tablet 75 mg  75 mg Oral Daily Sallyanne Havers, MD       Current Outpatient Prescriptions  Medication Sig Dispense Refill    . amLODipine (NORVASC) 10 MG tablet Take 1 tablet (10 mg total) by mouth daily. 90 tablet 1  . cilostazol (PLETAL) 100 MG tablet Take 1 tablet (100 mg total) by mouth 2 (two) times daily. 60 tablet 3  . fludrocortisone (FLORINEF) 0.1 MG tablet Take 1 tablet (0.1 mg total) by mouth daily. 90 tablet 3  . furosemide (LASIX) 40 MG tablet TAKE 0.5 TABLETS (20 MG TOTAL) BY MOUTH DAILY. 30 tablet 1  . hydrochlorothiazide (HYDRODIURIL) 12.5 MG tablet Take 12.5 mg by mouth daily.    [name] ipratropium (ATROVENT) 0.06 % nasal spray Place 2 sprays into the nose 3 (three) times daily. 15 mL 2  . nitroGLYCERIN (NITROSTAT) 0.4 MG SL tablet Place 1 tablet (0.4 mg total) under the tongue every 5 (five) minutes x 3 doses as needed for chest pain. 30 tablet 3  . polyethylene glycol (MIRALAX) packet Take 17 g by mouth daily. (Patient taking differently: Take 17 g by mouth daily as needed for mild constipation. ) 14 each 0  . simvastatin (ZOCOR) 20 MG tablet TAKE 1 TABLET (20 MG TOTAL) BY MOUTH DAILY. 30 tablet 2  . terazosin (HYTRIN) 1 MG capsule TAKE 1 CAPSULE (1 MG TOTAL) BY MOUTH AT BEDTIME. 30 capsule 2    Allergies: Allergies as of 03/02/2016 - Review Complete 03/02/2016  Allergen Reaction Noted  . Aspirin  08/24/2014   Past Medical History  Diagnosis Date  . Hypertension   . Headache(784.0)   .  Hyperkalemia 08/2014  . Stroke ([place])     hx of PAD  . Chronic kidney disease     HX OF CKD 3   History reviewed. No pertinent past surgical history. Family History  Problem Relation Age of Onset  . Hypertension Mother     died age 105  . Diabetes Mother    Social History   Social History  . Marital Status: Single    Spouse Name: N/A  . Number of Children: N/A  . Years of Education: N/A   Occupational History  . Not on file.   Social History Main Topics  . Smoking status: Current Every Day Smoker -- 1.00 packs/day for 40 years    Types: Cigarettes  . Smokeless tobacco: Never Used     Comment:  but hoping to quit one day  1/2PPD  . Alcohol Use: 5.4 oz/week    9 Cans of beer per week     Comment: Liquor once a week.  . Drug Use: No  . Sexual Activity: Not on file   Other Topics Concern  . Not on file   Social History Narrative   Works in the yard          Review of Systems: Pertinent items noted in HPI and remainder of comprehensive ROS otherwise negative.  Physical Exam: Blood pressure 157/81, pulse 94, temperature 97.7 F (36.5 C), resp. rate 18, SpO2 99 %. General: alert, well-developed, and cooperative to examination.  Head: normocephalic and atraumatic.  Eyes: vision grossly intact, pupils equal, pupils round, pupils reactive to light, no injection and anicteric.  Mouth: pharynx pink and moist, no erythema, and no exudates.  Neck: supple, full ROM, no thyromegaly, no JVD, and no carotid bruits.  Lungs: normal respiratory effort, no accessory muscle use, normal breath sounds, no crackles, and no wheezes. Heart: normal rate, regular rhythm, no murmur, no gallop, and no rub.  Abdomen: soft, non-tender, normal bowel sounds, no distention, no guarding, no rebound tenderness Msk: no joint swelling, no joint warmth, and no redness over joints.  Pulses: 2+ DP/PT pulses bilaterally Extremities: No cyanosis, clubbing, edema Neurologic: alert & oriented X3, cranial nerves II-XII intact, strength normal in all extremities, sensation intact to light touch, negative babinski  Skin: turgor normal and no rashes.  Psych: normal mood and affect  Lab results: Basic Metabolic Panel:  Recent Labs  03/02/16 1815  NA 138  K 4.9  CL 104  CO2 20*  GLUCOSE 108*  BUN 36*  CREATININE 2.08*  CALCIUM 9.0   Liver Function Tests:  Recent Labs  03/02/16 1815  AST 18  ALT 12*  ALKPHOS 93  BILITOT 0.6  PROT 7.8  ALBUMIN 3.9   CBC:  Recent Labs  03/02/16 1815  WBC 7.1  NEUTROABS 5.0  HGB 12.1*  HCT 38.7*  MCV 90.2  PLT 310   Urine Drug Screen: Drugs of Abuse      Component Value Date/Time   LABOPIA NONE DETECTED 06/26/2014 1552   COCAINSCRNUR NONE DETECTED 06/26/2014 1552   LABBENZ NONE DETECTED 06/26/2014 1552   AMPHETMU NONE DETECTED 06/26/2014 1552   THCU NONE DETECTED 06/26/2014 1552   LABBARB NONE DETECTED 06/26/2014 1552    Urinalysis:  Recent Labs  03/02/16 2026  COLORURINE YELLOW  LABSPEC 1.011  PHURINE 5.5  GLUCOSEU NEGATIVE  HGBUR NEGATIVE  BILIRUBINUR NEGATIVE  KETONESUR NEGATIVE  PROTEINUR NEGATIVE  NITRITE NEGATIVE  LEUKOCYTESUR NEGATIVE   Imaging results:  Dg Chest 2 View  03/02/2016  CLINICAL  DATA:  Dizziness and near syncope for 4 days. Dizzy when standing. History of hypertension and stroke. EXAM: CHEST  2 VIEW COMPARISON:  06/27/2014 FINDINGS: Mild hyperinflation suggesting emphysema. No focal airspace disease or consolidation in the lungs. Normal heart size and pulmonary vascularity. Nodular opacities projected over the mid lungs consistent with prominent nipple shadows. No blunting of costophrenic angles. No pneumothorax. IMPRESSION: Mild emphysematous changes in the lungs. No evidence of active pulmonary disease. Electronically Signed   By: Lucienne Capers M.D.   On: 03/02/2016 18:45   Ct Head Wo Contrast  03/02/2016  CLINICAL DATA:  Dizziness EXAM: CT HEAD WITHOUT CONTRAST TECHNIQUE: Contiguous axial images were obtained from the base of the skull through the vertex without intravenous contrast. COMPARISON:  02/27/2012 FINDINGS: Bony calvarium is intact. Scattered areas of decreased attenuation are noted in the deep white matter bilaterally consistent with chronic white matter ischemic change. There is a 2.2 x 02.1 cm rounded hypodensity in the posterior aspect of the left cerebellar hemisphere consistent with an acute infarct. No other focal abnormality is seen. IMPRESSION: Focal infarct in the posterior aspect of the left cerebellum as described. Electronically Signed   By: Inez Catalina M.D.   On: 03/02/2016 20:51    Other results: EKG: Sinus rhythm Probable left ventricular hypertrophy. Chronic anterior ST elevation, probably due to LVH Baseline wander in lead(s) I III aVL V4 V5  Assessment & Plan by Problem:  Left Cerebellar Stroke: Patient with 1 day history of dizziness/lightheadedness, headache and gait ataxia. CT head done in the ED shows 2.2 x 2.1 cm hypodensity in the posterior aspect of the left cerebellar hemisphere consistent with acute infarct. Site is consistent with his symptoms. He reports compliance with his medications at home. Smokes 1 PPD for the past 45 years. Reports severe nosebleed in the past after taking 1-2 full dose ASA and is adamant about not being on ASA. Patient was seen in the ED by neurology. Recommended MRI/MRA, Carotid Dopplers, ECHO. Started him on Plavix. Patient passed bedside swallow evaluation.  -Plavix 75 mg daily -Telemetry -ECHO -MRI/MRA -Carotid Dopplers -Lipid Panel -A1c  PT/OT evaluation -Start Atrovastatin 40 mg daily -Permissive HTN 220/110  AKI on CKD Stage III: Patient with Cr 2.08 on admission. Baseline 1.5-1.6. Patient reports poor PO intake the past day due to symptoms. Likely pre-renal. Will give IVF and monitor.  -BMP in am -NS 100 mL/hr  HTN: BP 140-150s/80-90s in the ED. Patient is currently on amlodipine 10 mg daily, lasix 40 mg HCTZ 12.5 mg daily. Reports compliance with his medications.   -Holding BP meds, permissive HTN  Hyporeninemic hypoaldosteronism: Patient compliant on fludrocortisone 0.1 mg daily and lasix 20 mg daily. Has been hospitalized multiple times in the past for hyperkalemia with cardiac changes. K 4.9 today. Currently asymptomatic without weakness, chest pain, SOB, or palpitations. Patient was referred to nephrology at her last clinic visit.  -Continue fludrocortisone 0.1 mg daily -Holding lasix  PAD: Patient currently on cilostazol 100 mg bid at home. Patient has leg claudication provoked on walking that requires  approximately 5-10 minutes of sitting to resolve. No history of invasive vascular procedures. ABI done 06/2014 show normal blood flow on the left and moderate reduction on the right.   HLD: Patient takes simvastatin 20 mg daily at home. -D/C simvastatin -Start atorvastatin 40 mg daily   Tobacco Abuse: Reports smoking 1 PPD. Never tried anything to quit before. Reports desire to quit smoking. Discussed the importance of quitting given current CVA.  -  Provide further resources to help quit at time of discharge  DVT PPx: SQ Heparin  Dispo: Disposition is deferred at this time, awaiting improvement of current medical problems. Anticipated discharge in approximately 2-3 day(s).   The patient does have a current PCP [name], MD) and does need an [facility] hospital follow-up appointment after discharge.  The patient does have transportation limitations that hinder transportation to clinic appointments.  Signed: Maryellen Pile, MD 03/03/2016, 2:17 AM

## 2016-03-03 NOTE — ED Notes (Signed)
Attempted to call report

## 2016-03-03 NOTE — Consult Note (Addendum)
Neurology Consultation Reason for Consult: Stroke Referring Physician: Dr Rogene Houston  CC: ataxia and headache x 2 days  History is obtained from patient  HPI: Derrick Hendrix is a 75 y.o. male with multiple stroke risk factors who is not antiplatelets because he gets nosebleeds from them.  For the last 2-3 days has had headache and gait ataxia and this morning became very lightheaded and his neighbor called EMS.  Ct in the ED shows a left cerebellar stroke that is subacute and c/w his clinical sx and hx.  Denies CP or SOB.  Denies non compliance with meds.  He is a smoker.  He reports severe nocturia to the point that he has a difficult time sleeping.   LKW: 2-3 days ago tpa given?: no  ROS: A 14 point ROS was performed and is negative except as noted in the HPI.  Past Medical History  Diagnosis Date  . Hypertension   . Headache(784.0)   . Hyperkalemia 08/2014  . Stroke ([place])     hx of PAD  . Chronic kidney disease     HX OF CKD 3    Family History  Problem Relation Age of Onset  . Hypertension Mother     died age 69  . Diabetes Mother     Social hx - smoker of > 40  Exam: Current vital signs: BP 147/79 mmHg  Pulse 93  Resp 22  SpO2 95% Vital signs in last 24 hours: Pulse Rate:  [63-135] 93 (03/20 0030) Resp:  [11-26] 22 (03/20 0030) BP: (132-160)/(79-98) 147/79 mmHg (03/20 0030) SpO2:  [91 %-99 %] 95 % (03/20 0030)   Physical Exam  Constitutional: Appears well-developed and well-nourished. Dry mucous membranes and poor dentition Psych: Affect appropriate to situation Eyes: No scleral injection HENT: No OP obstrucion Head: Normocephalic.  Cardiovascular: Normal rate and regular rhythm.  Respiratory: Effort normal and breath sounds normal to anterior ascultation GI: Soft.  No distension. There is no tenderness.  Skin: WDI  Neuro: Mental Status: Patient is awake, alert, oriented to person, place, month, year, and situation. Patient is able to give a clear  and coherent history. No signs of aphasia or neglect Cranial Nerves: II: Visual Fields are full. Pupils are equal, round, and reactive to light. III,IV, VI: EOMI without ptosis or diploplia.  V: Facial sensation is symmetric to temperature VII: Facial movement is symmetric.  VIII: hearing is intact to voice X: Uvula elevates symmetrically XI: Shoulder shrug is symmetric. XII: tongue is midline without atrophy or fasciculations.  Motor: Tone is normal. Bulk is normal. Generalized weakness but symmetric Sensory: Decreased sensation left leg. Deep Tendon Reflexes: 2+ and symmetric in the biceps and patellae. Plantars: Toes are downgoing bilaterally.  Cerebellar: FNF and HKS are intact bilaterally  I have reviewed labs in epic and the results pertinent to this consultation are:  I have reviewed the images obtained: CT head  Impression: Left cerebellar stroke.  Asa 325 now.  To have d/w patient regarding need for asa or plavix for storke prevention. Full stroke w/u.  Pt/ot/st. lovenox for dvt prophylaxis.  Stroke team will take over after later this am.  Renal failure to be addressed by primary team  Recommendations: 1) as above

## 2016-03-03 NOTE — ED Notes (Signed)
2nd attempt to call report. Spoke with [facility], who stated they would be unable to accept pt until after shift change.

## 2016-03-03 NOTE — Progress Notes (Signed)
VASCULAR LAB PRELIMINARY  PRELIMINARY  PRELIMINARY  PRELIMINARY  Carotid duplex completed.    Bilateral:  1-39% ICA stenosis.  Vertebral artery flow is antegrade.      Janifer Adie, RVT, RDMS 03/03/2016, 12:48 PM

## 2016-03-03 NOTE — ED Notes (Signed)
Admitting physician at bedside at this time.

## 2016-03-03 NOTE — Progress Notes (Signed)
   Subjective: Feeling well overnight however does have a continued left sided headache today. He does not feel much dizziness at rest and denies vertigo. He has not tried walking yet. MRI/MRA this morning showing his L PICA distribution infarct and carotid dopplers completed today.  Objective: Vital signs in last 24 hours: Filed Vitals:   03/03/16 0600 03/03/16 0700 03/03/16 0840 03/03/16 1430  BP: 133/79 142/84 140/76 130/68  Pulse: 87 88 80 89  Temp:   98 F (36.7 C) 99.1 F (37.3 C)  TempSrc:   Oral Oral  Resp: 18 15 16 17   SpO2: 93% 97% 98% 98%   Weight change:   Intake/Output Summary (Last 24 hours) at 03/03/16 1501 Last data filed at 03/03/16 0450  Gross per 24 hour  Intake   1000 ml  Output    500 ml  Net    500 ml   GENERAL- alert, co-operative, NAD HEENT- Grossly intact horizontal saccades bilaterally without nystagmus CARDIAC- RRR, no murmurs, rubs or gallops. RESP- CTAB, no wheezes or crackles. NEURO- No obvious Cr N abnormality, strength upper extremities- 5/5, Sensation intact globally EXTREMITIES- symmetric, no pedal edema. PSYCH- Normal mood and affect, appropriate thought content and speech.  Medications: I have reviewed the patient's current medications. Scheduled Meds: . atorvastatin  40 mg Oral q1800  . baclofen  10 mg Oral TID  . clopidogrel  75 mg Oral Daily  . fludrocortisone  0.1 mg Oral Daily  . heparin subcutaneous  5,000 Units Subcutaneous 3 times per day   Continuous Infusions: . sodium chloride 100 mL/hr at 03/03/16 0931   PRN Meds:.acetaminophen, polyethylene glycol Assessment/Plan: Left Cerebellar Stroke: Left vertebral artery diminished on MRA with V4 segment of L PICA not visualized. Some remote lacunar infarcts noted. At this time he has no deficits besides the admission symptoms of ataxia, and has not ambulated with physical therapy today. ICA benign 1-39% stenoses and no significant arrhythmia observed on cardiac monitoring overnight.  Refuses aspirin due to significant epistaxis with it in the past, but tolerating plavix x1 day. -Plavix 75 mg daily -Telemetry -ECHO -PT/OT evaluation -Permissive HTN 220/110 -Tylenol for headache  AKI on CKD Stage III: Patient with Cr 2.08->1.68. Baseline 1.5-1.6. Patient reports poor PO intake the past day due to symptoms. Likely pre-renal that has now resolved with volume replacement. -BMP in am  HTN: BP 140-150s/80-90s in the ED. Patient is currently on amlodipine 10 mg daily, lasix 40 mg HCTZ 12.5 mg daily. Reports compliance with his medications.  -Holding BP meds, permissive HTN  Hyporeninemic hypoaldosteronism: Electrolytes stable, vitals appropriate. Holding home lasix for volume depletion  Tobacco Abuse: Needs cessation counseling and assistance now that he has significant CVA  Diet: Regular VTE ppx: SQ Heparin FULL CODE  Dispo: Disposition is deferred at this time, awaiting improvement of current medical problems. Anticipated discharge in approximately 2-3 day(s).   The patient does have a current PCP [name], MD) and does need an [facility] hospital follow-up appointment after discharge.  The patient does have transportation limitations that hinder transportation to clinic appointments.   LOS: 0 days   [name], MD 03/03/2016, 3:01 PM

## 2016-03-03 NOTE — Care Management Note (Signed)
Case Management Note  Patient Details  Name: Derrick Hendrix MRN: [id number] Date of Birth: May 25, 1941  Subjective/Objective:                    Action/Plan: Patient was admitted with CVA. Will follow for discharge needs pending PT/OT evals and physician orders.  Expected Discharge Date:                  Expected Discharge Plan:     In-House Referral:     Discharge planning Services     Post Acute Care Choice:    Choice offered to:     DME Arranged:    DME Agency:     HH Arranged:    HH Agency:     Status of Service:  In process, will continue to follow  Medicare Important Message Given:    Date Medicare IM Given:    Medicare IM give by:    Date Additional Medicare IM Given:    Additional Medicare Important Message give by:     If discussed at [place] of Stay Meetings, dates discussed:    Additional CommentsRolm Baptise, RN 03/03/2016, 10:12 AM [phone]

## 2016-03-04 ENCOUNTER — Encounter: Payer: Self-pay | Admitting: *Deleted

## 2016-03-04 ENCOUNTER — Inpatient Hospital Stay (HOSPITAL_COMMUNITY): Payer: Medicare Other

## 2016-03-04 ENCOUNTER — Encounter (HOSPITAL_COMMUNITY)
Admission: EM | Disposition: A | Discharge status: Home / Self Care | Payer: Self-pay | Source: Home / Self Care | Attending: Student in an Organized Health Care Education/Training Program

## 2016-03-04 DIAGNOSIS — Z006 Encounter for examination for normal comparison and control in clinical research program: Secondary | ICD-10-CM

## 2016-03-04 DIAGNOSIS — I6789 Other cerebrovascular disease: Secondary | ICD-10-CM

## 2016-03-04 HISTORY — PX: EP IMPLANTABLE DEVICE: SHX172B

## 2016-03-04 LAB — HEMOGLOBIN A1C
HEMOGLOBIN A1C: 5.7 % — AB (ref 4.8–5.6)
Mean Plasma Glucose: 117 mg/dL

## 2016-03-04 LAB — ECHOCARDIOGRAM COMPLETE

## 2016-03-04 SURGERY — LOOP RECORDER INSERTION

## 2016-03-04 MED ORDER — LIDOCAINE-EPINEPHRINE 1 %-1:100000 IJ SOLN
INTRAMUSCULAR | Status: DC | PRN
  Administered 2016-03-04: 10 mL

## 2016-03-04 MED ORDER — ATORVASTATIN CALCIUM 40 MG PO TABS: 40.0000 mg | ORAL_TABLET | Freq: Every day | ORAL | Status: DC

## 2016-03-04 MED ORDER — LIDOCAINE-EPINEPHRINE 1 %-1:100000 IJ SOLN
INTRAMUSCULAR | Status: AC
  Filled 2016-03-04: qty 1

## 2016-03-04 MED ORDER — CLOPIDOGREL BISULFATE 75 MG PO TABS: 75.0000 mg | ORAL_TABLET | Freq: Every day | ORAL | Status: DC

## 2016-03-04 SURGICAL SUPPLY — 2 items
LOOP REVEAL LINQSYS (Prosthesis & Implant Heart) ×2 IMPLANT
PACK LOOP INSERTION (CUSTOM PROCEDURE TRAY) ×2 IMPLANT

## 2016-03-04 NOTE — Progress Notes (Signed)
   Subjective: Slept poorly overnight otherwise no complaints this morning. Had some continued left headache yesterday that partially improved with tylenol. He is still pending additional workup and did not work with physical therapy yet.  Objective: Vital signs in last 24 hours: Filed Vitals:   03/03/16 1756 03/03/16 2128 03/04/16 0102 03/04/16 0514  BP: 148/71 143/76 134/70 139/68  Pulse: 94 80 79 82  Temp: 97.7 F (36.5 C) 97.7 F (36.5 C) 98.4 F (36.9 C) 98 F (36.7 C)  TempSrc: Oral Oral Oral Oral  Resp: 17 16 18 18   SpO2: 95% 98% 95% 98%   Weight change:   Intake/Output Summary (Last 24 hours) at 03/04/16 1049 Last data filed at 03/04/16 [id number]  Gross per 24 hour  Intake    240 ml  Output   1650 ml  Net  -1410 ml   GENERAL- alert, co-operative, NAD CARDIAC- RRR, no murmurs, rubs or gallops RESP- CTAB, no wheezes or crackles NEURO- No obvious Cr N abnormality, strength upper extremities- 5/5 EXTREMITIES- symmetric, no pedal edema PSYCH- Tired, appropriate thought content and speech.  Medications: I have reviewed the patient's current medications. Scheduled Meds: . atorvastatin  40 mg Oral q1800  . baclofen  10 mg Oral TID  . clopidogrel  75 mg Oral Daily  . fludrocortisone  0.1 mg Oral Daily  . heparin subcutaneous  5,000 Units Subcutaneous 3 times per day   Continuous Infusions:   PRN Meds:.acetaminophen, polyethylene glycol Assessment/Plan: Left Cerebellar Stroke: Does not have deficits appreciable at rest however ambulation status still needs better characterization given primary complaint was ataxia. Getting TTE this morning, also needs PT/OT evaluation and treatment. No significant arrhythmias observed on telemetry up to this point. He is being followed by stroke team. -Neuro recommendations appreciated -Plavix 75 mg daily -Telemetry -ECHO -PT/OT evaluation -Tylenol for headache  HTN: BP 130-140s off meds. Patient is on amlodipine 10 mg daily, lasix 40  mg HCTZ 12.5 mg PTA. -Start lowering BP towards normal 2 days post stroke will set goal SBP<180 today, is in 140s off meds  Tobacco Abuse: Needs cessation counseling and assistance now that he has significant CVA  Diet: Regular VTE ppx:  Heparin FULL CODE  Dispo: Disposition is deferred at this time, awaiting improvement of current medical problems. Anticipated discharge in approximately 0-1 day(s).  The patient does have a current PCP [name], MD), we will arrange [facility] follow up soon after discharge.  The patient does have transportation limitations that hinder transportation to clinic appointments.   LOS: 1 day   [name], MD 03/04/2016, 10:49 AM

## 2016-03-04 NOTE — Evaluation (Signed)
Physical Therapy Evaluation Patient Details Name: Derrick Hendrix MRN: [id number] DOB: Feb 02, 1941 Today's Date: 03/04/2016   History of Present Illness  Pt is a 75 y.o. male admitted with a diagnosed left cerebellar stroke. PMH: hypertension, CVA, chronic kidney disease  Clinical Impression  Pt admitted with above diagnosis. At this time the patient was able to ambulate 600 feet independently and without loss of balance. He was also able to demonstrate independence with bed mobility, transfers and modified independence with stairs. Following our session, the patient states that he feels like he is not quite fully back to being himself but he states that he feels like he would be safe to go home by himself. He confirmed that he will be having friends checking on him several times per day. The patient denies any questions or concerns following the session.         Follow Up Recommendations No PT follow up;Supervision - Intermittent (patient declines any need for PT services)    Equipment Recommendations  None recommended by PT    Recommendations for Other Services       Precautions / Restrictions Precautions Precautions: None Restrictions Weight Bearing Restrictions: No      Mobility  Bed Mobility Overal bed mobility: Independent             General bed mobility comments: bed flat  Transfers Overall transfer level: Independent Equipment used: None             General transfer comment: pt performing from bed and toilet without assistance. No instbility with initial standing.   Ambulation/Gait Ambulation/Gait assistance: Independent Ambulation Distance (Feet): 600 Feet Assistive device: None Gait Pattern/deviations: WFL(Within Functional Limits)   Gait velocity interpretation: at or above normal speed for age/gender General Gait Details: increased LLE external rotation, no loss of balance during session. Patient able to perform head turns bilaterally without sway or  loss of balance.   Stairs Stairs: Yes Stairs assistance: Modified independent (Device/Increase time) Stair Management: Two rails;Forwards Number of Stairs: 10 General stair comments: Patient reports feeling confident with stairs. Safe pattern demonstrated.   Wheelchair Mobility    Modified Rankin (Stroke Patients Only) Modified Rankin (Stroke Patients Only) Pre-Morbid Rankin Score: No symptoms Modified Rankin: No significant disability     Balance Overall balance assessment: Independent                                           Pertinent Vitals/Pain Pain Assessment: No/denies pain    Home Living Family/patient expects to be discharged to:: Private residence Living Arrangements: Alone Available Help at Discharge: Friend(s);Available PRN/intermittently Type of Home: Apartment Home Access: Stairs to enter Entrance Stairs-Rails: Can reach both Entrance Stairs-Number of Steps: 4 Home Layout: One level Home Equipment: None Additional Comments: Pt reports that he has friends that stop by to check on him 2-3 times a day.     Prior Function Level of Independence: Independent               Hand Dominance        Extremity/Trunk Assessment   Upper Extremity Assessment: Overall WFL for tasks assessed           Lower Extremity Assessment: Overall WFL for tasks assessed         Communication   Communication: No difficulties  Cognition Arousal/Alertness: Awake/alert Behavior During Therapy: WFL for tasks assessed/performed Overall Cognitive Status:  Within Functional Limits for tasks assessed                      General Comments      Exercises        Assessment/Plan    PT Assessment Patent does not need any further PT services  PT Diagnosis     PT Problem List    PT Treatment Interventions     PT Goals (Current goals can be found in the Care Plan section) Acute Rehab PT Goals Patient Stated Goal: go back home PT Goal  Formulation: With patient Time For Goal Achievement: 03/11/16 Potential to Achieve Goals: Good    Frequency     Barriers to discharge        Co-evaluation               End of Session Equipment Utilized During Treatment: Gait belt Activity Tolerance: Patient tolerated treatment well Patient left: in chair;with call bell/phone within reach;with chair alarm set Nurse Communication: Mobility status    Functional Assessment Tool Used: clinical judgment Functional Limitation: Mobility: Walking and moving around Mobility: Walking and Moving Around Current Status [phone]): At least 1 percent but less than 20 percent impaired, limited or restricted Mobility: Walking and Moving Around Goal Status [phone]): At least 1 percent but less than 20 percent impaired, limited or restricted Mobility: Walking and Moving Around Discharge Status [phone]): At least 1 percent but less than 20 percent impaired, limited or restricted    Time: [id number] PT Time Calculation (min) (ACUTE ONLY): 24 min   Charges:   PT Evaluation $PT Eval Low Complexity: 1 Procedure PT Treatments $Gait Training: 8-22 mins   PT G Codes:   PT G-Codes **NOT FOR INPATIENT CLASS** Functional Assessment Tool Used: clinical judgment Functional Limitation: Mobility: Walking and moving around Mobility: Walking and Moving Around Current Status [id number]): At least 1 percent but less than 20 percent impaired, limited or restricted Mobility: Walking and Moving Around Goal Status [phone]): At least 1 percent but less than 20 percent impaired, limited or restricted Mobility: Walking and Moving Around Discharge Status [phone]): At least 1 percent but less than 20 percent impaired, limited or restricted    Cassell Clement, PT, [place] Pager [phone] Office [phone]  03/04/2016, 1:43 PM

## 2016-03-04 NOTE — H&P (View-Only) (Signed)
ELECTROPHYSIOLOGY CONSULT NOTE  Patient ID: Derrick Hendrix MRN: [id number], DOB/AGE: [birth year]   Admit date: 03/02/2016 Date of Consult: 03/04/2016  Primary Physician: Hinton Lovely, MD Primary Cardiologist: new to [facility] Reason for Consultation: Stroke, evaluation for Stroke AF study   History of Present Illness Daymond Ellsworth was admitted on 03/02/2016 with headache and gait ataxia. Imaging demonstrated left cerebellar stroke with dominant infarcts 2/2 left PICA occlusion.  He has undergone workup for stroke including echocardiogram and carotid dopplers.  The patient has been monitored on telemetry which has demonstrated sinus rhythm with no arrhythmias. The patient has been consented for the Stroke AF study.   Echocardiogram this admission demonstrated EF [id number], grade 1 diastolic dysfunction, LA 33.  Lab work is reviewed.  Prior to admission, the patient denies chest pain, shortness of breath, dizziness, palpitations, or syncope.  They are recovering from their stroke with plans to return home at discharge.  EP has been asked to evaluate for placement of an implantable loop recorder as part of Stroke AF study.  Past Medical History  Diagnosis Date  . Hypertension   . Headache(784.0)   . Hyperkalemia 08/2014  . Stroke ([place])     hx of PAD  . Chronic kidney disease     HX OF CKD 3     Surgical History: History reviewed. No pertinent past surgical history.   Prescriptions prior to admission  Medication Sig Dispense Refill Last Dose  . amLODipine (NORVASC) 10 MG tablet Take 1 tablet (10 mg total) by mouth daily. 90 tablet 1 03/02/2016 at Unknown time  . cilostazol (PLETAL) 100 MG tablet Take 1 tablet (100 mg total) by mouth 2 (two) times daily. 60 tablet 3 03/02/2016 at Unknown time  . fludrocortisone (FLORINEF) 0.1 MG tablet Take 1 tablet (0.1 mg total) by mouth daily. 90 tablet 3 03/02/2016 at Unknown time  . furosemide (LASIX) 40 MG tablet TAKE 0.5 TABLETS (20 MG TOTAL)  BY MOUTH DAILY. 30 tablet 1 03/02/2016 at Unknown time  . hydrochlorothiazide (HYDRODIURIL) 12.5 MG tablet Take 12.5 mg by mouth daily.   03/02/2016 at Unknown time  . ipratropium (ATROVENT) 0.06 % nasal spray Place 2 sprays into the nose 3 (three) times daily. 15 mL 2 Past Week at Unknown time  . nitroGLYCERIN (NITROSTAT) 0.4 MG SL tablet Place 1 tablet (0.4 mg total) under the tongue every 5 (five) minutes x 3 doses as needed for chest pain. 30 tablet 3 PRN  . polyethylene glycol (MIRALAX) packet Take 17 g by mouth daily. (Patient taking differently: Take 17 g by mouth daily as needed for mild constipation. ) 14 each 0 PRN  . simvastatin (ZOCOR) 20 MG tablet TAKE 1 TABLET (20 MG TOTAL) BY MOUTH DAILY. 30 tablet 2 03/01/2016 at Unknown time  . terazosin (HYTRIN) 1 MG capsule TAKE 1 CAPSULE (1 MG TOTAL) BY MOUTH AT BEDTIME. 30 capsule 2 03/01/2016 at Unknown time    Inpatient Medications:  . atorvastatin  40 mg Oral q1800  . baclofen  10 mg Oral TID  . clopidogrel  75 mg Oral Daily  . fludrocortisone  0.1 mg Oral Daily  . heparin subcutaneous  5,000 Units Subcutaneous 3 times per day    Allergies:  Allergies  Allergen Reactions  . Aspirin     Nosebleeds     Social History   Social History  . Marital Status: Single    Spouse Name: N/A  . Number of Children: N/A  . Years of Education:  N/A   Occupational History  . Not on file.   Social History Main Topics  . Smoking status: Current Every Day Smoker -- 1.00 packs/day for 40 years    Types: Cigarettes  . Smokeless tobacco: Never Used     Comment: but hoping to quit one day  1/2PPD  . Alcohol Use: No     Comment: Liquor once a week.  . Drug Use: No  . Sexual Activity: Not on file   Other Topics Concern  . Not on file   Social History Narrative   Works in the yard           Family History  Problem Relation Age of Onset  . Hypertension Mother     died age 10  . Diabetes Mother       Review of Systems: All other  systems reviewed and are otherwise negative except as noted above.  Physical Exam: Filed Vitals:   03/03/16 1756 03/03/16 2128 03/04/16 0102 03/04/16 0514  BP: 148/71 143/76 134/70 139/68  Pulse: 94 80 79 82  Temp: 97.7 F (36.5 C) 97.7 F (36.5 C) 98.4 F (36.9 C) 98 F (36.7 C)  TempSrc: Oral Oral Oral Oral  Resp: 17 16 18 18   SpO2: 95% 98% 95% 98%    GEN- The patient is well appearing, alert and oriented x 3 today.   Head- normocephalic, atraumatic Eyes-  Sclera clear, conjunctiva pink Ears- hearing intact Oropharynx- clear Neck- supple Lungs- Clear to ausculation bilaterally, normal work of breathing Heart- Regular rate and rhythm, no murmurs, rubs or gallops  GI- soft, NT, ND, + BS Extremities- no clubbing, cyanosis, or edema MS- no significant deformity or atrophy Skin- no rash or lesion Psych- euthymic mood, full affect   Labs:   Lab Results  Component Value Date   WBC 7.1 03/02/2016   HGB 12.1* 03/02/2016   HCT 38.7* 03/02/2016   MCV 90.2 03/02/2016   PLT 310 03/02/2016    Recent Labs Lab 03/02/16 1815 03/03/16 0521  NA 138 138  K 4.9 4.2  CL 104 102  CO2 20* 22  BUN 36* 30*  CREATININE 2.08* 1.68*  CALCIUM 9.0 9.1  PROT 7.8  --   BILITOT 0.6  --   ALKPHOS 93  --   ALT 12*  --   AST 18  --   GLUCOSE 108* 109*     Radiology/Studies: Dg Chest 2 View 03/02/2016  CLINICAL DATA:  Dizziness and near syncope for 4 days. Dizzy when standing. History of hypertension and stroke. EXAM: CHEST  2 VIEW COMPARISON:  06/27/2014 FINDINGS: Mild hyperinflation suggesting emphysema. No focal airspace disease or consolidation in the lungs. Normal heart size and pulmonary vascularity. Nodular opacities projected over the mid lungs consistent with prominent nipple shadows. No blunting of costophrenic angles. No pneumothorax. IMPRESSION: Mild emphysematous changes in the lungs. No evidence of active pulmonary disease. Electronically Signed   By: Lucienne Capers M.D.   On:  03/02/2016 18:45   [name] Head Wo Contrast 03/03/2016  CLINICAL DATA:  Initial evaluation for 2-3 days of headache, ataxia EXAM: MRI HEAD WITHOUT CONTRAST MRA HEAD WITHOUT CONTRAST TECHNIQUE: Multiplanar, multiecho pulse sequences of the brain and surrounding structures were obtained without intravenous contrast. Angiographic images of the head were obtained using MRA technique without contrast. COMPARISON:  Prior CT from earlier the same day. FINDINGS: MRI HEAD FINDINGS Diffuse prominence of the CSF containing spaces is compatible with generalized age-related cerebral atrophy. Patchy T2/FLAIR hyperintensity within  the periventricular and deep white matter both cerebral hemispheres most consistent with chronic small vessel ischemic disease, moderate nature. Small vessel type changes present within the pons. Few superimposed lacunar infarcts within the bilateral basal ganglia and white matter of the corona radiata out. Patchy multi focal ischemic infarcts present within the inferior left cerebellar hemisphere, compatible with acute left PICA territory infarcts. No significant mass effect or associated hemorrhage. No other infarct. Gray-white matter differentiation otherwise maintained. Major intracranial vascular flow voids preserved. Left vertebral artery appears to be diminutive. No acute or chronic intracranial hemorrhage. No mass lesion, midline shift, or mass effect. No hydrocephalus. No extra-axial fluid collection. Major dural sinuses are grossly patent. Craniocervical junction within normal limits. Severe degenerative spondylolysis noted at C3-4 with focal kyphosis of the cervical spine and results in mild canal stenosis. Pituitary gland within normal limits. No acute abnormality about the orbits. Probable remote left orbital floor fracture noted. Mild mucosal thickening within the frontoethmoidal recesses bilaterally. Paranasal sinuses otherwise clear. Trace right mastoid opacity without significant  effusion. Inner ear structures grossly normal. Bone marrow signal intensity within normal limits. No scalp soft tissue abnormality. MRA HEAD FINDINGS ANTERIOR CIRCULATION: Visualized portions of the distal cervical ICAs are patent with antegrade flow. Petrous and cavernous segments are widely patent without stenosis. Mild to moderate smooth narrowing of the supraclinoid left ICA. Supraclinoid right ICA widely patent. A1 segments patent. Anterior communicating artery normal. Anterior cerebral arteries well opacified to their distal aspects. M1 segments patent without stenosis or occlusion. MCA bifurcations within normal limits. Distal MCA branches well opacified and symmetric. POSTERIOR CIRCULATION: Dominant right vertebral artery widely patent to the vertebrobasilar junction. Right posterior inferior cerebral artery well opacified. Left vertebral artery hypoplastic/diminutive, and likely terminates in PICA. The distal left V4 segment/PICA is not well visualized, likely occluded. Basilar artery widely patent. Superior cerebellar arteries well opacified. Both posterior cerebral those arise the basilar artery are well opacified to their distal aspects. IMPRESSION: MRI HEAD IMPRESSION: 1. Patchy multifocal ischemic nonhemorrhagic left PICA territory infarcts. No significant mass effect. 2. Generalized cerebral atrophy with moderate chronic microvascular ischemic disease with superimposed remote lacunar infarcts involving the bilateral basal ganglia/corona radiata. MRA HEAD IMPRESSION: 1. Diminutive left vertebral artery, likely terminating in PICA. Distal left V4 segment/left PICA not visualized, likely occluded. 2. No other large or proximal arterial branch occlusion within the intracranial circulation. No high-grade or correctable stenosis. 3. Mild to moderate smooth narrowing of the supraclinoid left ICA. Electronically Signed   By: Jeannine Boga M.D.   On: 03/03/2016 05:17   12-lead ECG sinus rhythm, rate  91 All prior EKG's in EPIC reviewed with no documented atrial fibrillation  Telemetry sinus rhythm   Assessment and Plan:  1. Stroke 2/2 L PICA occlusion  The patient presented with a stroke. The Stroke AF study has been discussed with the patient who wishes to proceed. Risks, benefits of ILR reviewed with the patient today.   Wound care was reviewed with the patient (keep incision clean and dry for 3 days).    Please call with questions.   Thompson Grayer MD, [facility] 03/04/2016 3:51 PM

## 2016-03-04 NOTE — Progress Notes (Signed)
  Echocardiogram 2D Echocardiogram has been performed.  Jennette Dubin 03/04/2016, 10:24 AM

## 2016-03-04 NOTE — Progress Notes (Signed)
STROKE TEAM PROGRESS NOTE   HISTORY OF PRESENT ILLNESS Derrick Hendrix is a 75 y.o. male with multiple stroke risk factors who is not antiplatelets because he gets nosebleeds from them. For the last 2-3 days has had headache and gait ataxia and this morning became very lightheaded and his neighbor called EMS. Ct in the ED shows a left cerebellar stroke that is subacute and c/w his clinical sx and hx. Denies CP or SOB. Denies non compliance with meds. He is a smoker. He reports severe nocturia to the point that he has a difficult time sleeping.   LKW: 2-3 days ago tpa given?: no   SUBJECTIVE (INTERVAL HISTORY) No family members present. The patient voices no new complaints.   OBJECTIVE Temp:  [97.6 F (36.4 C)-98.4 F (36.9 C)] 97.6 F (36.4 C) (03/21 1417) Pulse Rate:  [79-94] 92 (03/21 1417) Cardiac Rhythm:  [-] Normal sinus rhythm (03/20 1900) Resp:  [16-18] 17 (03/21 1417) BP: (134-155)/(68-76) 155/71 mmHg (03/21 1417) SpO2:  [95 %-100 %] 100 % (03/21 1417)  CBC:   Recent Labs Lab 03/02/16 1815  WBC 7.1  NEUTROABS 5.0  HGB 12.1*  HCT 38.7*  MCV 90.2  PLT 99991111    Basic Metabolic Panel:   Recent Labs Lab 03/02/16 1815 03/03/16 0521  NA 138 138  K 4.9 4.2  CL 104 102  CO2 20* 22  GLUCOSE 108* 109*  BUN 36* 30*  CREATININE 2.08* 1.68*  CALCIUM 9.0 9.1    Lipid Panel:     Component Value Date/Time   CHOL 149 03/03/2016 0521   TRIG 65 03/03/2016 0521   HDL 41 03/03/2016 0521   CHOLHDL 3.6 03/03/2016 0521   VLDL 13 03/03/2016 0521   LDLCALC 95 03/03/2016 0521   HgbA1c:  Lab Results  Component Value Date   HGBA1C 5.7* 03/03/2016   Urine Drug Screen:     Component Value Date/Time   LABOPIA NONE DETECTED 06/26/2014 1552   COCAINSCRNUR NONE DETECTED 06/26/2014 1552   LABBENZ NONE DETECTED 06/26/2014 1552   AMPHETMU NONE DETECTED 06/26/2014 1552   THCU NONE DETECTED 06/26/2014 1552   LABBARB NONE DETECTED 06/26/2014 1552      IMAGING  Dg  Chest 2 View 03/02/2016   Mild emphysematous changes in the lungs. No evidence of active pulmonary disease.     Ct Head Wo Contrast 03/02/2016   Focal infarct in the posterior aspect of the left cerebellum as described.    [name] Head Wo Contrast 03/03/2016    MRI HEAD IMPRESSION:  1. Patchy multifocal ischemic nonhemorrhagic left PICA territory infarcts. No significant mass effect.  2. Generalized cerebral atrophy with moderate chronic microvascular ischemic disease with superimposed remote lacunar infarcts involving the bilateral basal ganglia/corona radiata.   MRA HEAD IMPRESSION:  1. Diminutive left vertebral artery, likely terminating in PICA. Distal left V4 segment/left PICA not visualized, likely occluded.  2. No other large or proximal arterial branch occlusion within the intracranial circulation. No high-grade or correctable stenosis.  3. Mild to moderate smooth narrowing of the supraclinoid left ICA.     PHYSICAL EXAM Pleasant elderly male not in distress. . Afebrile. Head is nontraumatic. Neck is supple without bruit.    Cardiac exam no murmur or gallop. Lungs are clear to auscultation. Distal pulses are well felt.  Neurological Exam ;  Awake  Alert oriented x 3. Normal speech and language.eye movements full without nystagmus.fundi were not visualized. Vision acuity and fields appear normal. Hearing is normal. Palatal movements  are normal. Face symmetric. Tongue midline. Normal strength, tone, reflexes and coordination. Normal sensation. Gait deferred.     ASSESSMENT/PLAN [name] is a 75 y.o. male with history of aspirin intolerance secondary to nosebleeds, hypertension, headaches, hyperkalemia, previous stroke, and chronic kidney disease presenting with dizziness, gait ataxia, and headache. He did not receive IV t-PA due to late presentation.  Stroke:  Dominant  infarcts thrombotic secondary to left PICA occlusion.  Resultant  gait ataxia.  MRI  Patchy  multifocal ischemic nonhemorrhagic left PICA territory infarcts   MRA  Diminutive left vertebral artery. Distal left V4 segment/left PICA not visualized, likely occluded.   Carotid Doppler- Bilateral: 1-39% ICA stenosis. Vertebral artery flow is antegrade.  2D Echo EF 65-70%. No cardiac source of emboli identified.  Loop implanted today 03/04/2016   LDL 95  HgbA1c 5.7  VTE prophylaxis - subcutaneous heparin Diet Heart Room service appropriate?: Yes; Fluid consistency:: Thin  No antithrombotic prior to admission, now on clopidogrel 75 mg daily  Patient counseled to be compliant with his antithrombotic medications  Ongoing aggressive stroke risk factor management  Therapy recommendations: No follow-up physical therapy recommended  Disposition: Pending  Hypertension  Stable  Permissive hypertension (OK if < 220/120) but gradually normalize in 5-7 days  Hyperlipidemia  Home meds:  Zocor 20 mg daily changed to Lipitor 40 mg daily  LDL 95, goal < 70  Continue statin at discharge    Other Stroke Risk Factors  Advanced age  Cigarette smoker,advised to stop smoking  ETOH use  Hx stroke/TIA   Other Active Problems  Chronic kidney disease  Mild anemia  Hospital day # 1   Mikey Bussing PA-C [facility] Pager [phone] 03/04/2016, 5:35 PM  I have personally examined this patient, reviewed notes, independently viewed imaging studies, participated in medical decision making and plan of care. I have made any additions or clarifications directly to the above note. Agree with note above. He presented with gait ataxia, headache and dizziness secondary to left cerebellar infarct etiology likely left posterior inferior cerebellar artery occlusion due to intracranial atherosclerosis. Patient is at risk for neurological worsening, recurrent stroke, TIA needs ongoing stroke evaluation and aggressive risk factor modification. Patient is interested in  participating in the stroke atrial fibrillation trial. I have reviewed the risk-benefit with the patient and answered all questions. The patient meets inclusion criteria. He will be randomized into the study after he reads consent form and signs that.  Antony Contras, MD      To contact Stroke Continuity provider, please refer to [email or web address] After hours, contact General Neurology

## 2016-03-04 NOTE — Progress Notes (Signed)
STROKE -AF Research Study reviewed with patient. Questions encouraged and answered. Subject met inclusion and exclusion criteria. The informed consent form, study requirements and expectations were reviewed with the subject and questions and concerns were addressed prior to the signing of the consent form. The subject verbalized understanding of the trail requirements. The subject agreed to participate in the STROKE-AF trial and signed the informed consent. The informed consent was obtained prior to performance of any protocol-specific procedures for the subject. A copy of the signed informed consent was given to the subject and a copy was placed in the subject's medical record. Patient was Randomized to Continuous Monitoring (LINQ). 

## 2016-03-04 NOTE — Discharge Instructions (Addendum)
You had a stroke in the area of your brain that controls balance causing dizziness. We did not find a specific ongoing cause for this but it is most likely related to plaque built up in the artery that then became obstructed.  The most important changes to make to prevent another event are: taking clopidogrel (Plavix) once daily reducing cigarette smoking taking blood pressure medication   If you develop trouble with nosebleeds on Pavix please call or come see [place] in clinic as we may have some alternative options besides just stopping your medicine and leaving you at high risk for another stroke.  Please follow up in clinic so we can discuss your progress and see if your medications need adjustment. More options for helping reduce smoking too since this is a major but important undertaking.

## 2016-03-04 NOTE — Consult Note (Signed)
ELECTROPHYSIOLOGY CONSULT NOTE  Patient ID: Derrick Hendrix MRN: [id number], DOB/AGE: [birth year]   Admit date: 03/02/2016 Date of Consult: 03/04/2016  Primary Physician: Hinton Lovely, MD Primary Cardiologist: new to [facility] Reason for Consultation: Stroke, evaluation for Stroke AF study   History of Present Illness Derrick Hendrix was admitted on 03/02/2016 with headache and gait ataxia. Imaging demonstrated left cerebellar stroke with dominant infarcts 2/2 left PICA occlusion.  He has undergone workup for stroke including echocardiogram and carotid dopplers.  The patient has been monitored on telemetry which has demonstrated sinus rhythm with no arrhythmias. The patient has been consented for the Stroke AF study.   Echocardiogram this admission demonstrated EF [id number], grade 1 diastolic dysfunction, LA 33.  Lab work is reviewed.  Prior to admission, the patient denies chest pain, shortness of breath, dizziness, palpitations, or syncope.  They are recovering from their stroke with plans to return home at discharge.  EP has been asked to evaluate for placement of an implantable loop recorder as part of Stroke AF study.  Past Medical History  Diagnosis Date  . Hypertension   . Headache(784.0)   . Hyperkalemia 08/2014  . Stroke ([place])     hx of PAD  . Chronic kidney disease     HX OF CKD 3     Surgical History: History reviewed. No pertinent past surgical history.   Prescriptions prior to admission  Medication Sig Dispense Refill Last Dose  . amLODipine (NORVASC) 10 MG tablet Take 1 tablet (10 mg total) by mouth daily. 90 tablet 1 03/02/2016 at Unknown time  . cilostazol (PLETAL) 100 MG tablet Take 1 tablet (100 mg total) by mouth 2 (two) times daily. 60 tablet 3 03/02/2016 at Unknown time  . fludrocortisone (FLORINEF) 0.1 MG tablet Take 1 tablet (0.1 mg total) by mouth daily. 90 tablet 3 03/02/2016 at Unknown time  . furosemide (LASIX) 40 MG tablet TAKE 0.5 TABLETS (20 MG TOTAL)  BY MOUTH DAILY. 30 tablet 1 03/02/2016 at Unknown time  . hydrochlorothiazide (HYDRODIURIL) 12.5 MG tablet Take 12.5 mg by mouth daily.   03/02/2016 at Unknown time  . ipratropium (ATROVENT) 0.06 % nasal spray Place 2 sprays into the nose 3 (three) times daily. 15 mL 2 Past Week at Unknown time  . nitroGLYCERIN (NITROSTAT) 0.4 MG SL tablet Place 1 tablet (0.4 mg total) under the tongue every 5 (five) minutes x 3 doses as needed for chest pain. 30 tablet 3 PRN  . polyethylene glycol (MIRALAX) packet Take 17 g by mouth daily. (Patient taking differently: Take 17 g by mouth daily as needed for mild constipation. ) 14 each 0 PRN  . simvastatin (ZOCOR) 20 MG tablet TAKE 1 TABLET (20 MG TOTAL) BY MOUTH DAILY. 30 tablet 2 03/01/2016 at Unknown time  . terazosin (HYTRIN) 1 MG capsule TAKE 1 CAPSULE (1 MG TOTAL) BY MOUTH AT BEDTIME. 30 capsule 2 03/01/2016 at Unknown time    Inpatient Medications:  . atorvastatin  40 mg Oral q1800  . baclofen  10 mg Oral TID  . clopidogrel  75 mg Oral Daily  . fludrocortisone  0.1 mg Oral Daily  . heparin subcutaneous  5,000 Units Subcutaneous 3 times per day    Allergies:  Allergies  Allergen Reactions  . Aspirin     Nosebleeds     Social History   Social History  . Marital Status: Single    Spouse Name: N/A  . Number of Children: N/A  . Years of Education:  N/A   Occupational History  . Not on file.   Social History Main Topics  . Smoking status: Current Every Day Smoker -- 1.00 packs/day for 40 years    Types: Cigarettes  . Smokeless tobacco: Never Used     Comment: but hoping to quit one day  1/2PPD  . Alcohol Use: No     Comment: Liquor once a week.  . Drug Use: No  . Sexual Activity: Not on file   Other Topics Concern  . Not on file   Social History Narrative   Works in the yard           Family History  Problem Relation Age of Onset  . Hypertension Mother     died age 22  . Diabetes Mother       Review of Systems: All other  systems reviewed and are otherwise negative except as noted above.  Physical Exam: Filed Vitals:   03/03/16 1756 03/03/16 2128 03/04/16 0102 03/04/16 0514  BP: 148/71 143/76 134/70 139/68  Pulse: 94 80 79 82  Temp: 97.7 F (36.5 C) 97.7 F (36.5 C) 98.4 F (36.9 C) 98 F (36.7 C)  TempSrc: Oral Oral Oral Oral  Resp: 17 16 18 18   SpO2: 95% 98% 95% 98%    GEN- The patient is well appearing, alert and oriented x 3 today.   Head- normocephalic, atraumatic Eyes-  Sclera clear, conjunctiva pink Ears- hearing intact Oropharynx- clear Neck- supple Lungs- Clear to ausculation bilaterally, normal work of breathing Heart- Regular rate and rhythm, no murmurs, rubs or gallops  GI- soft, NT, ND, + BS Extremities- no clubbing, cyanosis, or edema MS- no significant deformity or atrophy Skin- no rash or lesion Psych- euthymic mood, full affect   Labs:   Lab Results  Component Value Date   WBC 7.1 03/02/2016   HGB 12.1* 03/02/2016   HCT 38.7* 03/02/2016   MCV 90.2 03/02/2016   PLT 310 03/02/2016    Recent Labs Lab 03/02/16 1815 03/03/16 0521  NA 138 138  K 4.9 4.2  CL 104 102  CO2 20* 22  BUN 36* 30*  CREATININE 2.08* 1.68*  CALCIUM 9.0 9.1  PROT 7.8  --   BILITOT 0.6  --   ALKPHOS 93  --   ALT 12*  --   AST 18  --   GLUCOSE 108* 109*     Radiology/Studies: Dg Chest 2 View 03/02/2016  CLINICAL DATA:  Dizziness and near syncope for 4 days. Dizzy when standing. History of hypertension and stroke. EXAM: CHEST  2 VIEW COMPARISON:  06/27/2014 FINDINGS: Mild hyperinflation suggesting emphysema. No focal airspace disease or consolidation in the lungs. Normal heart size and pulmonary vascularity. Nodular opacities projected over the mid lungs consistent with prominent nipple shadows. No blunting of costophrenic angles. No pneumothorax. IMPRESSION: Mild emphysematous changes in the lungs. No evidence of active pulmonary disease. Electronically Signed   By: Lucienne Capers M.D.   On:  03/02/2016 18:45   [name] Head Wo Contrast 03/03/2016  CLINICAL DATA:  Initial evaluation for 2-3 days of headache, ataxia EXAM: MRI HEAD WITHOUT CONTRAST MRA HEAD WITHOUT CONTRAST TECHNIQUE: Multiplanar, multiecho pulse sequences of the brain and surrounding structures were obtained without intravenous contrast. Angiographic images of the head were obtained using MRA technique without contrast. COMPARISON:  Prior CT from earlier the same day. FINDINGS: MRI HEAD FINDINGS Diffuse prominence of the CSF containing spaces is compatible with generalized age-related cerebral atrophy. Patchy T2/FLAIR hyperintensity within  the periventricular and deep white matter both cerebral hemispheres most consistent with chronic small vessel ischemic disease, moderate nature. Small vessel type changes present within the pons. Few superimposed lacunar infarcts within the bilateral basal ganglia and white matter of the corona radiata out. Patchy multi focal ischemic infarcts present within the inferior left cerebellar hemisphere, compatible with acute left PICA territory infarcts. No significant mass effect or associated hemorrhage. No other infarct. Gray-white matter differentiation otherwise maintained. Major intracranial vascular flow voids preserved. Left vertebral artery appears to be diminutive. No acute or chronic intracranial hemorrhage. No mass lesion, midline shift, or mass effect. No hydrocephalus. No extra-axial fluid collection. Major dural sinuses are grossly patent. Craniocervical junction within normal limits. Severe degenerative spondylolysis noted at C3-4 with focal kyphosis of the cervical spine and results in mild canal stenosis. Pituitary gland within normal limits. No acute abnormality about the orbits. Probable remote left orbital floor fracture noted. Mild mucosal thickening within the frontoethmoidal recesses bilaterally. Paranasal sinuses otherwise clear. Trace right mastoid opacity without significant  effusion. Inner ear structures grossly normal. Bone marrow signal intensity within normal limits. No scalp soft tissue abnormality. MRA HEAD FINDINGS ANTERIOR CIRCULATION: Visualized portions of the distal cervical ICAs are patent with antegrade flow. Petrous and cavernous segments are widely patent without stenosis. Mild to moderate smooth narrowing of the supraclinoid left ICA. Supraclinoid right ICA widely patent. A1 segments patent. Anterior communicating artery normal. Anterior cerebral arteries well opacified to their distal aspects. M1 segments patent without stenosis or occlusion. MCA bifurcations within normal limits. Distal MCA branches well opacified and symmetric. POSTERIOR CIRCULATION: Dominant right vertebral artery widely patent to the vertebrobasilar junction. Right posterior inferior cerebral artery well opacified. Left vertebral artery hypoplastic/diminutive, and likely terminates in PICA. The distal left V4 segment/PICA is not well visualized, likely occluded. Basilar artery widely patent. Superior cerebellar arteries well opacified. Both posterior cerebral those arise the basilar artery are well opacified to their distal aspects. IMPRESSION: MRI HEAD IMPRESSION: 1. Patchy multifocal ischemic nonhemorrhagic left PICA territory infarcts. No significant mass effect. 2. Generalized cerebral atrophy with moderate chronic microvascular ischemic disease with superimposed remote lacunar infarcts involving the bilateral basal ganglia/corona radiata. MRA HEAD IMPRESSION: 1. Diminutive left vertebral artery, likely terminating in PICA. Distal left V4 segment/left PICA not visualized, likely occluded. 2. No other large or proximal arterial branch occlusion within the intracranial circulation. No high-grade or correctable stenosis. 3. Mild to moderate smooth narrowing of the supraclinoid left ICA. Electronically Signed   By: Jeannine Boga M.D.   On: 03/03/2016 05:17   12-lead ECG sinus rhythm, rate  91 All prior EKG's in EPIC reviewed with no documented atrial fibrillation  Telemetry sinus rhythm   Assessment and Plan:  1. Stroke 2/2 L PICA occlusion  The patient presented with a stroke. The Stroke AF study has been discussed with the patient who wishes to proceed. Risks, benefits of ILR reviewed with the patient today.   Wound care was reviewed with the patient (keep incision clean and dry for 3 days).    Please call with questions.   Thompson Grayer MD, [facility] 03/04/2016 3:51 PM

## 2016-03-04 NOTE — Progress Notes (Signed)
PT Cancellation Note  Patient Details Name: Taris Villalva MRN: [id number] DOB: 11/04/1941   Cancelled Treatment:    Reason Eval/Treat Not Completed: Patient at procedure or test/unavailable. Will check back.    Cassell Clement, PT, CSCS Pager [phone] Office [phone]  03/04/2016, 10:19 AM

## 2016-03-04 NOTE — Discharge Summary (Signed)
Name: Doreon Osada MRN: [id number] DOB: 28-Jul-1941 75 y.o. PCP: [name], MD  Date of Admission: 03/02/2016  5:23 PM Date of Discharge: 03/05/2016 Attending Physician: Axel Filler, MD  Discharge Diagnosis: Active Problems:   Cerebellar infarct [facility])  Discharge Medications:   Medication List    STOP taking these medications        simvastatin 20 MG tablet  Commonly known as:  ZOCOR      TAKE these medications        amLODipine 10 MG tablet  Commonly known as:  NORVASC  Take 1 tablet (10 mg total) by mouth daily.     atorvastatin 40 MG tablet  Commonly known as:  LIPITOR  Take 1 tablet (40 mg total) by mouth daily at 6 PM.     cilostazol 100 MG tablet  Commonly known as:  PLETAL  Take 1 tablet (100 mg total) by mouth 2 (two) times daily.     clopidogrel 75 MG tablet  Commonly known as:  PLAVIX  Take 1 tablet (75 mg total) by mouth daily.     fludrocortisone 0.1 MG tablet  Commonly known as:  FLORINEF  Take 1 tablet (0.1 mg total) by mouth daily.     furosemide 40 MG tablet  Commonly known as:  LASIX  TAKE 0.5 TABLETS (20 MG TOTAL) BY MOUTH DAILY.     hydrochlorothiazide 12.5 MG tablet  Commonly known as:  HYDRODIURIL  Take 12.5 mg by mouth daily.     ipratropium 0.06 % nasal spray  Commonly known as:  ATROVENT  Place 2 sprays into the nose 3 (three) times daily.     nitroGLYCERIN 0.4 MG SL tablet  Commonly known as:  NITROSTAT  Place 1 tablet (0.4 mg total) under the tongue every 5 (five) minutes x 3 doses as needed for chest pain.     polyethylene glycol packet  Commonly known as:  MIRALAX  Take 17 g by mouth daily.     terazosin 1 MG capsule  Commonly known as:  HYTRIN  TAKE 1 CAPSULE (1 MG TOTAL) BY MOUTH AT BEDTIME.        Disposition and follow-up:   Mr.Quantarius Goette was discharged from [facility] in Good condition.  At the hospital follow up visit please address:  Acute left cerebellar infarct: L  PICA distribution ischemic infarct with severe dizziness as the primary symptom. This was partially resolved in the hospital and physical therapy did not recommend additional interventions needed. He was started on plavix and increased to high intensity statin. He was enrolled in stroke-AF study and discharged with implantable loop recorder.  Hx of epistaxis on aspirin: Due to his multiple risk factors and recent stroke consider ENT referral for symptoms rather than discontinuing plavix.  Follow-up Appointments: Follow-up Information    Follow up with [facility].   Specialty:  Emergency Medicine   Why:  If symptoms worsen   Contact information:   [address] [id number] [place] [phone]      Follow up with SETHI,PRAMOD, MD. Schedule an appointment as soon as possible for a visit in 2 months.   Specialties:  Neurology, Radiology   Contact information:   [address] [place] [address] [phone]       Follow up with Hinton Lovely, MD. Schedule an appointment as soon as possible for a visit in 2 weeks.   Specialty:  Internal Medicine  Why:  For hospital follow up and medication refills   Contact information:   [address] [place] [address] [phone]       Discharge Instructions: Discharge Instructions    Ambulatory referral to Neurology    Complete by:  As directed   Dr. Leonie Man requests follow up for this patient in 2 months.           Consultations: Treatment Team:  Md Stroke, MD  Procedures Performed:  Dg Chest 2 View  03/02/2016  CLINICAL DATA:  Dizziness and near syncope for 4 days. Dizzy when standing. History of hypertension and stroke. EXAM: CHEST  2 VIEW COMPARISON:  06/27/2014 FINDINGS: Mild hyperinflation suggesting emphysema. No focal airspace disease or consolidation in the lungs. Normal heart size and pulmonary vascularity. Nodular opacities  projected over the mid lungs consistent with prominent nipple shadows. No blunting of costophrenic angles. No pneumothorax. IMPRESSION: Mild emphysematous changes in the lungs. No evidence of active pulmonary disease. Electronically Signed   By: Lucienne Capers M.D.   On: 03/02/2016 18:45   Ct Head Wo Contrast  03/02/2016  CLINICAL DATA:  Dizziness EXAM: CT HEAD WITHOUT CONTRAST TECHNIQUE: Contiguous axial images were obtained from the base of the skull through the vertex without intravenous contrast. COMPARISON:  02/27/2012 FINDINGS: Bony calvarium is intact. Scattered areas of decreased attenuation are noted in the deep white matter bilaterally consistent with chronic white matter ischemic change. There is a 2.2 x 02.1 cm rounded hypodensity in the posterior aspect of the left cerebellar hemisphere consistent with an acute infarct. No other focal abnormality is seen. IMPRESSION: Focal infarct in the posterior aspect of the left cerebellum as described. Electronically Signed   By: Inez Catalina M.D.   On: 03/02/2016 20:51   [name] Head Wo Contrast  03/03/2016  CLINICAL DATA:  Initial evaluation for 2-3 days of headache, ataxia EXAM: MRI HEAD WITHOUT CONTRAST MRA HEAD WITHOUT CONTRAST TECHNIQUE: Multiplanar, multiecho pulse sequences of the brain and surrounding structures were obtained without intravenous contrast. Angiographic images of the head were obtained using MRA technique without contrast. COMPARISON:  Prior CT from earlier the same day. FINDINGS: MRI HEAD FINDINGS Diffuse prominence of the CSF containing spaces is compatible with generalized age-related cerebral atrophy. Patchy T2/FLAIR hyperintensity within the periventricular and deep white matter both cerebral hemispheres most consistent with chronic small vessel ischemic disease, moderate nature. Small vessel type changes present within the pons. Few superimposed lacunar infarcts within the bilateral basal ganglia and white matter of the corona  radiata out. Patchy multi focal ischemic infarcts present within the inferior left cerebellar hemisphere, compatible with acute left PICA territory infarcts. No significant mass effect or associated hemorrhage. No other infarct. Gray-white matter differentiation otherwise maintained. Major intracranial vascular flow voids preserved. Left vertebral artery appears to be diminutive. No acute or chronic intracranial hemorrhage. No mass lesion, midline shift, or mass effect. No hydrocephalus. No extra-axial fluid collection. Major dural sinuses are grossly patent. Craniocervical junction within normal limits. Severe degenerative spondylolysis noted at C3-4 with focal kyphosis of the cervical spine and results in mild canal stenosis. Pituitary gland within normal limits. No acute abnormality about the orbits. Probable remote left orbital floor fracture noted. Mild mucosal thickening within the frontoethmoidal recesses bilaterally. Paranasal sinuses otherwise clear. Trace right mastoid opacity without significant effusion. Inner ear structures grossly normal. Bone marrow signal intensity within normal limits. No scalp soft tissue abnormality. MRA HEAD FINDINGS ANTERIOR CIRCULATION: Visualized portions of the distal cervical ICAs are  patent with antegrade flow. Petrous and cavernous segments are widely patent without stenosis. Mild to moderate smooth narrowing of the supraclinoid left ICA. Supraclinoid right ICA widely patent. A1 segments patent. Anterior communicating artery normal. Anterior cerebral arteries well opacified to their distal aspects. M1 segments patent without stenosis or occlusion. MCA bifurcations within normal limits. Distal MCA branches well opacified and symmetric. POSTERIOR CIRCULATION: Dominant right vertebral artery widely patent to the vertebrobasilar junction. Right posterior inferior cerebral artery well opacified. Left vertebral artery hypoplastic/diminutive, and likely terminates in PICA. The  distal left V4 segment/PICA is not well visualized, likely occluded. Basilar artery widely patent. Superior cerebellar arteries well opacified. Both posterior cerebral those arise the basilar artery are well opacified to their distal aspects. IMPRESSION: MRI HEAD IMPRESSION: 1. Patchy multifocal ischemic nonhemorrhagic left PICA territory infarcts. No significant mass effect. 2. Generalized cerebral atrophy with moderate chronic microvascular ischemic disease with superimposed remote lacunar infarcts involving the bilateral basal ganglia/corona radiata. MRA HEAD IMPRESSION: 1. Diminutive left vertebral artery, likely terminating in PICA. Distal left V4 segment/left PICA not visualized, likely occluded. 2. No other large or proximal arterial branch occlusion within the intracranial circulation. No high-grade or correctable stenosis. 3. Mild to moderate smooth narrowing of the supraclinoid left ICA. Electronically Signed   By: Jeannine Boga M.D.   On: 03/03/2016 05:17   Mr Brain Wo Contrast  03/03/2016  CLINICAL DATA:  Initial evaluation for 2-3 days of headache, ataxia EXAM: MRI HEAD WITHOUT CONTRAST MRA HEAD WITHOUT CONTRAST TECHNIQUE: Multiplanar, multiecho pulse sequences of the brain and surrounding structures were obtained without intravenous contrast. Angiographic images of the head were obtained using MRA technique without contrast. COMPARISON:  Prior CT from earlier the same day. FINDINGS: MRI HEAD FINDINGS Diffuse prominence of the CSF containing spaces is compatible with generalized age-related cerebral atrophy. Patchy T2/FLAIR hyperintensity within the periventricular and deep white matter both cerebral hemispheres most consistent with chronic small vessel ischemic disease, moderate nature. Small vessel type changes present within the pons. Few superimposed lacunar infarcts within the bilateral basal ganglia and white matter of the corona radiata out. Patchy multi focal ischemic infarcts present  within the inferior left cerebellar hemisphere, compatible with acute left PICA territory infarcts. No significant mass effect or associated hemorrhage. No other infarct. Gray-white matter differentiation otherwise maintained. Major intracranial vascular flow voids preserved. Left vertebral artery appears to be diminutive. No acute or chronic intracranial hemorrhage. No mass lesion, midline shift, or mass effect. No hydrocephalus. No extra-axial fluid collection. Major dural sinuses are grossly patent. Craniocervical junction within normal limits. Severe degenerative spondylolysis noted at C3-4 with focal kyphosis of the cervical spine and results in mild canal stenosis. Pituitary gland within normal limits. No acute abnormality about the orbits. Probable remote left orbital floor fracture noted. Mild mucosal thickening within the frontoethmoidal recesses bilaterally. Paranasal sinuses otherwise clear. Trace right mastoid opacity without significant effusion. Inner ear structures grossly normal. Bone marrow signal intensity within normal limits. No scalp soft tissue abnormality. MRA HEAD FINDINGS ANTERIOR CIRCULATION: Visualized portions of the distal cervical ICAs are patent with antegrade flow. Petrous and cavernous segments are widely patent without stenosis. Mild to moderate smooth narrowing of the supraclinoid left ICA. Supraclinoid right ICA widely patent. A1 segments patent. Anterior communicating artery normal. Anterior cerebral arteries well opacified to their distal aspects. M1 segments patent without stenosis or occlusion. MCA bifurcations within normal limits. Distal MCA branches well opacified and symmetric. POSTERIOR CIRCULATION: Dominant right vertebral artery widely patent to the vertebrobasilar  junction. Right posterior inferior cerebral artery well opacified. Left vertebral artery hypoplastic/diminutive, and likely terminates in PICA. The distal left V4 segment/PICA is not well visualized, likely  occluded. Basilar artery widely patent. Superior cerebellar arteries well opacified. Both posterior cerebral those arise the basilar artery are well opacified to their distal aspects. IMPRESSION: MRI HEAD IMPRESSION: 1. Patchy multifocal ischemic nonhemorrhagic left PICA territory infarcts. No significant mass effect. 2. Generalized cerebral atrophy with moderate chronic microvascular ischemic disease with superimposed remote lacunar infarcts involving the bilateral basal ganglia/corona radiata. MRA HEAD IMPRESSION: 1. Diminutive left vertebral artery, likely terminating in PICA. Distal left V4 segment/left PICA not visualized, likely occluded. 2. No other large or proximal arterial branch occlusion within the intracranial circulation. No high-grade or correctable stenosis. 3. Mild to moderate smooth narrowing of the supraclinoid left ICA. Electronically Signed   By: Jeannine Boga M.D.   On: 03/03/2016 05:17    2D Echo: LV EF: 65% - 70%  ------------------------------------------------------------------- Indications: CVA 436. ------------------------------------------------------------------- History: Risk factors: Current tobacco use. Hypertension. ------------------------------------------------------------------- Study Conclusions  - Left ventricle: The cavity size was normal. There was mild  concentric hypertrophy. Systolic function was vigorous. The  estimated ejection fraction was in the range of 65% to 70%. Wall  motion was normal; there were no regional wall motion  abnormalities. Doppler parameters are consistent with abnormal  left ventricular relaxation (grade 1 diastolic dysfunction).  There was no evidence of elevated ventricular filling pressure by  Doppler parameters. - Aortic valve: There was no regurgitation. - Mitral valve: Structurally normal valve. - Left atrium: The atrium was normal in size. - Right ventricle: Systolic function was normal. -  Right atrium: The atrium was normal in size. - Tricuspid valve: There was mild regurgitation. - Pulmonary arteries: Systolic pressure was at the upper limits of  normal. PA peak pressure: 32 mm Hg (S). - Inferior vena cava: The vessel was normal in size. - Pericardium, extracardiac: There was no pericardial effusion.  Transthoracic echocardiography. M-mode, complete 2D, spectral Doppler, and color Doppler. Birthdate: Patient birthdate: 12-27-1940. Age: Patient is 75 yr old. Sex: Gender: male. BMI: 24.7 kg/m^2. Blood pressure: 139/68 Patient status: Inpatient. Study date: Study date: 03/04/2016. Study time: 09:38 AM. Location: Echo laboratory. ------------------------------------------------------------------- Left ventricle: The cavity size was normal. There was mild concentric hypertrophy. Systolic function was vigorous. The estimated ejection fraction was in the range of 65% to 70%. Wall motion was normal; there were no regional wall motion abnormalities. Doppler parameters are consistent with abnormal left ventricular relaxation (grade 1 diastolic dysfunction). There was no evidence of elevated ventricular filling pressure by Doppler parameters. ------------------------------------------------------------------- Aortic valve: Trileaflet; normal thickness leaflets. Mobility was not restricted. Doppler: Transvalvular velocity was within the normal range. There was no stenosis. There was no regurgitation. ------------------------------------------------------------------- Aorta: Aortic root: The aortic root was normal in size. ----------------------------------------------------------------- Mitral valve: Structurally normal valve. Mobility was not restricted. Doppler: Transvalvular velocity was within the normal range. There was no evidence for stenosis. There was trivial regurgitation. Peak gradient (D): 2 mm  Hg. ------------------------------------------------------------------- Left atrium: The atrium was normal in size. ------------------------------------------------------------------- Right ventricle: The cavity size was normal. Wall thickness was normal. Systolic function was normal. ------------------------------------------------------------------- Pulmonic valve: Structurally normal valve. Cusp separation was normal. Doppler: Transvalvular velocity was within the normal range. There was no evidence for stenosis. There was no regurgitation. ------------------------------------------------------------------- Tricuspid valve: Structurally normal valve. Doppler: Transvalvular velocity was within the normal range. There was mild regurgitation. ------------------------------------------------------------------- Pulmonary artery: The main pulmonary artery was  normal-sized. Systolic pressure was at the upper limits of normal. ------------------------------------------------------------------- Right atrium: The atrium was normal in size. ------------------------------------------------------------------- Pericardium: There was no pericardial effusion. ------------------------------------------------------------------- Systemic veins: Inferior vena cava: The vessel was normal in size.  Admission HPI: Mr. Kosik is a 75 year old male with a past medical history of HTN, CKD Stage III, Hyporeninemic hypoaldosteronism, PAD, HLD, current smoker presents today with complaints of dizziness, lightheadedness and gait instability. He reports that last night he stood up and became very lightheaded and dizzy. He sat back down with improvement of his symptoms and waited 10-15 minutes before trying to stand up again. He became dizzy and lightheaded again but symptoms were not relieved when sitting down. Symptoms resolved after another 10-15 minutes and he went to sleep. This morning, he again  became lightheaded and dizzy, feeling like he was going to pass out with an associated left sided headache that was dull aching in nature and had difficulty ambulating. He reports feeling unsteady on his feet. Never lost consciousness. Denies any room spinning. Denied any weakness, numbness, dysarthria, chest pain, palpitations, shortness of breath, abdominal pain, nausea or vomiting. Reports compliance with his medications at home. Does report smoking 1 PPD at home. Reports history of severe nosebleeds with ASA in the past.  Hospital Course by problem list: Acute left cerebellar ischemic stroke: He was admitted for stroke evaluation and head imaging obtained. Head Ct demonstrated no bleeding and MRI demonstrated acute multifocal ischemic stroke in the L cerebellum with distal V4 segment of PICA likely occluded. Carotid ultrasound showed no significant stenoses. TTE was obtained demonstrating no apparent cardioembolic source. He performed well with physical and occupational therapy and evidenced no dysphagia. He was started on plavix daily with history of ASA intolerance from nosebleeds. Statin therapy increased from moderate to high intensity regimen. Due to no obvious single source identified he was offered and chose to participate in stroke-AF trial and implantable loop recorder was placed 3/21.  Discharge Vitals:   BP 137/71 mmHg  Pulse 81  Temp(Src) 99.1 F (37.3 C) (Oral)  Resp 18  SpO2 100%  Signed: [name], MD 03/07/2016, 2:03 PM

## 2016-03-04 NOTE — Interval H&P Note (Signed)
History and Physical Interval Note:  03/04/2016 3:53 PM  Derrick Hendrix  has presented today for surgery, with the diagnosis of stroke  The various methods of treatment have been discussed with the patient and family. After consideration of risks, benefits and other options for treatment, the patient has consented to  Procedure(s): Loop Recorder Insertion (N/A) as a surgical intervention .  The patient's history has been reviewed, patient examined, no change in status, stable for surgery.  I have reviewed the patient's chart and labs.  Questions were answered to the patient's satisfaction.     Thompson Grayer

## 2016-03-05 ENCOUNTER — Encounter: Payer: Self-pay | Admitting: *Deleted

## 2016-03-05 ENCOUNTER — Encounter (HOSPITAL_COMMUNITY): Payer: Self-pay | Admitting: Internal Medicine

## 2016-03-05 ENCOUNTER — Other Ambulatory Visit: Payer: Self-pay | Admitting: Acute Care

## 2016-03-05 DIAGNOSIS — Z006 Encounter for examination for normal comparison and control in clinical research program: Secondary | ICD-10-CM

## 2016-03-05 DIAGNOSIS — F17210 Nicotine dependence, cigarettes, uncomplicated: Secondary | ICD-10-CM

## 2016-03-05 NOTE — Care Management Note (Signed)
Case Management Note  Patient Details  Name: Jayvone Diane MRN: [id number] Date of Birth: [birth year]  Subjective/Objective:                    Action/Plan: Plan is for patient to discharge today with self care. No further needs per CM.   Expected Discharge Date:                  Expected Discharge Plan:  Home/Self Care  In-House Referral:     Discharge planning Services     Post Acute Care Choice:    Choice offered to:     DME Arranged:    DME Agency:     HH Arranged:    [place] Agency:     Status of Service:  Completed, signed off  Medicare Important Message Given:    Date Medicare IM Given:    Medicare IM give by:    Date Additional Medicare IM Given:    Additional Medicare Important Message give by:     If discussed at [place] of Stay Meetings, dates discussed:    Additional Comments:  Pollie Friar, RN 03/05/2016, 10:59 AM

## 2016-03-05 NOTE — Progress Notes (Signed)
Discharge orders received. Pt educated on discharge instructions and stroke education. Pt verbalized understanding. PT given discharge packet. IV and tele removed. Pt given paper scrubs to discharge in. Pt walked downstairs by staff for discharge.

## 2016-03-05 NOTE — Progress Notes (Signed)
STROKE TEAM PROGRESS NOTE   HISTORY OF PRESENT ILLNESS Derrick Hendrix is a 75 y.o. male with multiple stroke risk factors who is not antiplatelets because he gets nosebleeds from them. For the last 2-3 days has had headache and gait ataxia and this morning became very lightheaded and his neighbor called EMS. Ct in the ED shows a left cerebellar stroke that is subacute and c/w his clinical sx and hx. Denies CP or SOB. Denies non compliance with meds. He is a smoker. He reports severe nocturia to the point that he has a difficult time sleeping.   LKW: 2-3 days ago tpa given?: no   SUBJECTIVE (INTERVAL HISTORY) No family members present. The patient voices no new complaints.He is participating in the stroke AF trial and was randomized to the continuous monitoring arm. He will had loop recorder inserted 03/04/2016. He stayed overnight because he did not have a ride to go home OBJECTIVE Temp:  [98.3 F (36.8 C)-99.3 F (37.4 C)] 99.3 F (37.4 C) (03/22 1432) Pulse Rate:  [77-89] 77 (03/22 1432) Cardiac Rhythm:  [-] Normal sinus rhythm (03/21 1900) Resp:  [18-19] 18 (03/22 1432) BP: (137-168)/(65-86) 145/74 mmHg (03/22 1432) SpO2:  [96 %-100 %] 100 % (03/22 1432)  CBC:   Recent Labs Lab 03/02/16 1815  WBC 7.1  NEUTROABS 5.0  HGB 12.1*  HCT 38.7*  MCV 90.2  PLT 242    Basic Metabolic Panel:   Recent Labs Lab 03/02/16 1815 03/03/16 0521  NA 138 138  K 4.9 4.2  CL 104 102  CO2 20* 22  GLUCOSE 108* 109*  BUN 36* 30*  CREATININE 2.08* 1.68*  CALCIUM 9.0 9.1    Lipid Panel:     Component Value Date/Time   CHOL 149 03/03/2016 0521   TRIG 65 03/03/2016 0521   HDL 41 03/03/2016 0521   CHOLHDL 3.6 03/03/2016 0521   VLDL 13 03/03/2016 0521   LDLCALC 95 03/03/2016 0521   HgbA1c:  Lab Results  Component Value Date   HGBA1C 5.7* 03/03/2016   Urine Drug Screen:     Component Value Date/Time   LABOPIA NONE DETECTED 06/26/2014 1552   COCAINSCRNUR NONE DETECTED  06/26/2014 1552   LABBENZ NONE DETECTED 06/26/2014 1552   AMPHETMU NONE DETECTED 06/26/2014 1552   THCU NONE DETECTED 06/26/2014 1552   LABBARB NONE DETECTED 06/26/2014 1552      IMAGING  Dg Chest 2 View 03/02/2016   Mild emphysematous changes in the lungs. No evidence of active pulmonary disease.     Ct Head Wo Contrast 03/02/2016   Focal infarct in the posterior aspect of the left cerebellum as described.    [name] Head Wo Contrast 03/03/2016    MRI HEAD IMPRESSION:  1. Patchy multifocal ischemic nonhemorrhagic left PICA territory infarcts. No significant mass effect.  2. Generalized cerebral atrophy with moderate chronic microvascular ischemic disease with superimposed remote lacunar infarcts involving the bilateral basal ganglia/corona radiata.   MRA HEAD IMPRESSION:  1. Diminutive left vertebral artery, likely terminating in PICA. Distal left V4 segment/left PICA not visualized, likely occluded.  2. No other large or proximal arterial branch occlusion within the intracranial circulation. No high-grade or correctable stenosis.  3. Mild to moderate smooth narrowing of the supraclinoid left ICA.     PHYSICAL EXAM Pleasant elderly male not in distress. . Afebrile. Head is nontraumatic. Neck is supple without bruit.    Cardiac exam no murmur or gallop. Lungs are clear to auscultation. Distal pulses are well felt.  Neurological Exam ;  Awake  Alert oriented x 3. Normal speech and language.eye movements full without nystagmus.fundi were not visualized. Vision acuity and fields appear normal. Hearing is normal. Palatal movements are normal. Face symmetric. Tongue midline. Normal strength, tone, reflexes and coordination. Normal sensation. Gait deferred.     ASSESSMENT/PLAN [name] is a 75 y.o. male with history of aspirin intolerance secondary to nosebleeds, hypertension, headaches, hyperkalemia, previous stroke, and chronic kidney disease presenting with  dizziness, gait ataxia, and headache. He did not receive IV t-PA due to late presentation.  Stroke:  Dominant  infarcts thrombotic secondary to left PICA occlusion.  Resultant  gait ataxia.  MRI  Patchy multifocal ischemic nonhemorrhagic left PICA territory infarcts   MRA  Diminutive left vertebral artery. Distal left V4 segment/left PICA not visualized, likely occluded.   Carotid Doppler- Bilateral: 1-39% ICA stenosis. Vertebral artery flow is antegrade.  2D Echo EF 65-70%. No cardiac source of emboli identified.  Loop implanted today 03/04/2016   LDL 95  HgbA1c 5.7  VTE prophylaxis - subcutaneous heparin Diet Heart Room service appropriate?: Yes; Fluid consistency:: Thin  No antithrombotic prior to admission, now on clopidogrel 75 mg daily  Patient counseled to be compliant with his antithrombotic medications  Ongoing aggressive stroke risk factor management  Therapy recommendations: No follow-up physical therapy recommended  Disposition: Pending  Hypertension  Stable  Permissive hypertension (OK if < 220/120) but gradually normalize in 5-7 days  Hyperlipidemia  Home meds:  Zocor 20 mg daily changed to Lipitor 40 mg daily  LDL 95, goal < 70  Continue statin at discharge    Other Stroke Risk Factors  Advanced age  Cigarette smoker,advised to stop smoking  ETOH use  Hx stroke/TIA   Other Active Problems  Chronic kidney disease  Mild anemia  Hospital day # 2     I have personally examined this patient, reviewed notes, independently viewed imaging studies, participated in medical decision making and plan of care. I have made any additions or clarifications directly to the above note. Agree with note above. He presented with gait ataxia, headache and dizziness secondary to left cerebellar infarct etiology likely left posterior inferior cerebellar artery occlusion due to intracranial atherosclerosis. Patient is at risk for neurological worsening,  recurrent stroke, TIA needs ongoing stroke evaluation and aggressive risk factor modification. Patient is  participating in the stroke atrial fibrillation trial. I have reviewed the risk-benefit with the patient and answered all questions. The patient met inclusion criteria. No studies specific procedure was performed prior to patient signing consent form.  Patient otherwise to follow up in stroke clinic in 2 months and in the [facility] as per stroke A. fib study protocol Antony Contras, MD      To contact Stroke Continuity provider, please refer to [email or web address] After hours, contact General Neurology

## 2016-03-05 NOTE — Progress Notes (Signed)
Internal Medicine Attending:   I saw and examined the patient. I reviewed the resident's note and I agree with the resident's findings and plan as documented in the resident's note.  75 year old man with a cerebellar stroke here for inpatient evaluation. Most likely cause is ischemic atherosclerotic disease for which he is being managed with clopidogrel and atorvastatin. He's being planned for loop recorder placement to evaluate for paroxysmal atrial fibrillation.

## 2016-03-05 NOTE — Progress Notes (Signed)
Loop recorder home device set up reviewed with patient. Pt instructed he will probably have device and wound check in 100 days at [address] office and [facility] 1 month device check scheduled for 04/02/15@ 10 am. Directions map and phone number given to patient. Questions encouraged and answered.

## 2016-03-05 NOTE — Progress Notes (Signed)
   Subjective: [name] was enrolled in stroke-AF trial yesterday, loop recorder was placed, and patient provided device and follow up instructions. He stayed overnight to finish coordination of care and because he did not have a ride after procedures and education finished last night. Feeling well this morning and eager to return home.  Objective: Vital signs in last 24 hours: Filed Vitals:   03/04/16 2141 03/05/16 0156 03/05/16 0520 03/05/16 0908  BP: 168/86 144/73 137/71 142/65  Pulse: 83 81 81 80  Temp: 98.8 F (37.1 C) 98.9 F (37.2 C) 99.1 F (37.3 C) 98.3 F (36.8 C)  TempSrc: Oral Oral Oral Oral  Resp: 18 18 18 18   SpO2: 98% 100% 100% 96%   GENERAL- alert, co-operative, NAD NEURO- speech slightly dysarthric, fully intelligible and without aphasia EXTREMITIES- symmetric, no pedal edema PSYCH- Normal mood, appropriate thought content and speech.  Medications: I have reviewed the patient's current medications. Scheduled Meds: . atorvastatin  40 mg Oral q1800  . baclofen  10 mg Oral TID  . clopidogrel  75 mg Oral Daily  . fludrocortisone  0.1 mg Oral Daily  . heparin subcutaneous  5,000 Units Subcutaneous 3 times per day   Continuous Infusions:   PRN Meds:.acetaminophen, polyethylene glycol Assessment/Plan: Left Cerebellar Stroke: Apparent occlusion of V4 L PICA distribution with ischemic infarct. He did well with therapy-no specific home therapies were indicated and he has supervision the majority of time at home with family. ILR placed yesterday for continuous monitoring under stroke-AF study. Plan is for 10 day and one month follow ups with cardiology to follow monitor data. Main interventions at this time are starting plavix and increase to high intensity statin. He will need close clinic follow up for management of his blood pressure, which has been mild to moderately elevated during this hospitalization while withholding home meds for permissive hypertension. -Neuro  recommendations appreciated -Plavix 75 mg daily  Diet: Regular VTE ppx: [place] Heparin FULL CODE  Dispo:  Anticipated discharge to home today.  The patient does have a current PCP Derrick Hendrix), we will arrange [facility] follow up soon after discharge.  The patient does have transportation limitations that hinder transportation to clinic appointments.   LOS: 2 days   Derrick Hendrix 03/05/2016, 10:23 AM

## 2016-03-07 ENCOUNTER — Encounter (HOSPITAL_COMMUNITY): Payer: Self-pay | Admitting: Emergency Medicine

## 2016-03-07 ENCOUNTER — Emergency Department (HOSPITAL_COMMUNITY): Payer: Medicare Other

## 2016-03-07 ENCOUNTER — Observation Stay (HOSPITAL_COMMUNITY)
Admission: EM | Admit: 2016-03-07 | Discharge: 2016-03-09 | Disposition: A | Discharge status: Home / Self Care | Payer: Medicare Other | Attending: Student in an Organized Health Care Education/Training Program | Admitting: Student in an Organized Health Care Education/Training Program

## 2016-03-07 DIAGNOSIS — R531 Weakness: Secondary | ICD-10-CM | POA: Diagnosis not present

## 2016-03-07 DIAGNOSIS — I129 Hypertensive chronic kidney disease with stage 1 through stage 4 chronic kidney disease, or unspecified chronic kidney disease: Secondary | ICD-10-CM | POA: Diagnosis not present

## 2016-03-07 DIAGNOSIS — I491 Atrial premature depolarization: Secondary | ICD-10-CM | POA: Diagnosis not present

## 2016-03-07 DIAGNOSIS — I4891 Unspecified atrial fibrillation: Secondary | ICD-10-CM | POA: Insufficient documentation

## 2016-03-07 DIAGNOSIS — Z7902 Long term (current) use of antithrombotics/antiplatelets: Secondary | ICD-10-CM | POA: Diagnosis not present

## 2016-03-07 DIAGNOSIS — E875 Hyperkalemia: Secondary | ICD-10-CM | POA: Diagnosis not present

## 2016-03-07 DIAGNOSIS — I10 Essential (primary) hypertension: Secondary | ICD-10-CM | POA: Diagnosis not present

## 2016-03-07 DIAGNOSIS — E2740 Unspecified adrenocortical insufficiency: Secondary | ICD-10-CM | POA: Insufficient documentation

## 2016-03-07 DIAGNOSIS — M47812 Spondylosis without myelopathy or radiculopathy, cervical region: Secondary | ICD-10-CM | POA: Insufficient documentation

## 2016-03-07 DIAGNOSIS — F17210 Nicotine dependence, cigarettes, uncomplicated: Secondary | ICD-10-CM | POA: Insufficient documentation

## 2016-03-07 DIAGNOSIS — R55 Syncope and collapse: Secondary | ICD-10-CM | POA: Insufficient documentation

## 2016-03-07 DIAGNOSIS — G319 Degenerative disease of nervous system, unspecified: Secondary | ICD-10-CM | POA: Diagnosis not present

## 2016-03-07 DIAGNOSIS — R918 Other nonspecific abnormal finding of lung field: Secondary | ICD-10-CM | POA: Diagnosis not present

## 2016-03-07 DIAGNOSIS — M4802 Spinal stenosis, cervical region: Secondary | ICD-10-CM | POA: Diagnosis not present

## 2016-03-07 DIAGNOSIS — R42 Dizziness and giddiness: Secondary | ICD-10-CM

## 2016-03-07 DIAGNOSIS — Z8673 Personal history of transient ischemic attack (TIA), and cerebral infarction without residual deficits: Secondary | ICD-10-CM | POA: Diagnosis not present

## 2016-03-07 DIAGNOSIS — E785 Hyperlipidemia, unspecified: Secondary | ICD-10-CM | POA: Diagnosis not present

## 2016-03-07 DIAGNOSIS — I672 Cerebral atherosclerosis: Secondary | ICD-10-CM | POA: Diagnosis not present

## 2016-03-07 DIAGNOSIS — R519 Headache, unspecified: Secondary | ICD-10-CM | POA: Diagnosis present

## 2016-03-07 DIAGNOSIS — I739 Peripheral vascular disease, unspecified: Secondary | ICD-10-CM | POA: Insufficient documentation

## 2016-03-07 DIAGNOSIS — N179 Acute kidney failure, unspecified: Secondary | ICD-10-CM | POA: Diagnosis not present

## 2016-03-07 DIAGNOSIS — Z7982 Long term (current) use of aspirin: Secondary | ICD-10-CM | POA: Diagnosis not present

## 2016-03-07 DIAGNOSIS — Z79899 Other long term (current) drug therapy: Secondary | ICD-10-CM | POA: Diagnosis not present

## 2016-03-07 DIAGNOSIS — R51 Headache: Principal | ICD-10-CM | POA: Insufficient documentation

## 2016-03-07 DIAGNOSIS — N183 Chronic kidney disease, stage 3 (moderate): Secondary | ICD-10-CM | POA: Insufficient documentation

## 2016-03-07 DIAGNOSIS — I639 Cerebral infarction, unspecified: Secondary | ICD-10-CM | POA: Diagnosis not present

## 2016-03-07 DIAGNOSIS — M79671 Pain in right foot: Secondary | ICD-10-CM | POA: Diagnosis not present

## 2016-03-07 DIAGNOSIS — R200 Anesthesia of skin: Secondary | ICD-10-CM | POA: Insufficient documentation

## 2016-03-07 DIAGNOSIS — R404 Transient alteration of awareness: Secondary | ICD-10-CM | POA: Diagnosis not present

## 2016-03-07 DIAGNOSIS — I63532 Cerebral infarction due to unspecified occlusion or stenosis of left posterior cerebral artery: Secondary | ICD-10-CM | POA: Insufficient documentation

## 2016-03-07 HISTORY — DX: Dizziness and giddiness: R42

## 2016-03-07 LAB — CBC
HCT: 38.9 % — ABNORMAL LOW (ref 39.0–52.0)
HEMOGLOBIN: 12.5 g/dL — AB (ref 13.0–17.0)
MCH: 29.5 pg (ref 26.0–34.0)
MCHC: 32.1 g/dL (ref 30.0–36.0)
MCV: 91.7 fL (ref 78.0–100.0)
Platelets: 255 10*3/uL (ref 150–400)
RBC: 4.24 MIL/uL (ref 4.22–5.81)
RDW: 15.1 % (ref 11.5–15.5)
WBC: 6 10*3/uL (ref 4.0–10.5)

## 2016-03-07 LAB — BASIC METABOLIC PANEL
ANION GAP: 13 (ref 5–15)
BUN: 38 mg/dL — ABNORMAL HIGH (ref 6–20)
CALCIUM: 9.3 mg/dL (ref 8.9–10.3)
CO2: 18 mmol/L — ABNORMAL LOW (ref 22–32)
Chloride: 105 mmol/L (ref 101–111)
Creatinine, Ser: 1.96 mg/dL — ABNORMAL HIGH (ref 0.61–1.24)
GFR calc Af Amer: 36 mL/min — ABNORMAL LOW (ref >60)
GFR, EST NON AFRICAN AMERICAN: 31 mL/min — AB (ref >60)
GLUCOSE: 101 mg/dL — AB (ref 65–99)
Potassium: 6 mmol/L — ABNORMAL HIGH (ref 3.5–5.1)
Sodium: 136 mmol/L (ref 135–145)

## 2016-03-07 LAB — I-STAT TROPONIN, ED: TROPONIN I, POC: 0.01 ng/mL (ref 0.00–0.08)

## 2016-03-07 LAB — POTASSIUM: Potassium: 4.9 mmol/L (ref 3.5–5.1)

## 2016-03-07 MED ORDER — ACETAMINOPHEN 325 MG PO TABS: 650.0000 mg | ORAL_TABLET | ORAL | Status: DC | PRN

## 2016-03-07 MED ORDER — SODIUM CHLORIDE 0.9% FLUSH: 3.0000 mL | INTRAVENOUS | Status: DC | PRN

## 2016-03-07 MED ORDER — ATORVASTATIN CALCIUM 40 MG PO TABS
40.0000 mg | ORAL_TABLET | Freq: Every day | ORAL | Status: DC
  Administered 2016-03-07 – 2016-03-08 (×2): 40 mg via ORAL
  Filled 2016-03-07 (×2): qty 1

## 2016-03-07 MED ORDER — SODIUM CHLORIDE 0.9% FLUSH
3.0000 mL | Freq: Two times a day (BID) | INTRAVENOUS | Status: DC
  Administered 2016-03-07 – 2016-03-08 (×2): 3 mL via INTRAVENOUS
  Administered 2016-03-08: 10 mL via INTRAVENOUS
  Administered 2016-03-09: 3 mL via INTRAVENOUS

## 2016-03-07 MED ORDER — SODIUM CHLORIDE 0.9 % IV SOLN
INTRAVENOUS | Status: AC
  Administered 2016-03-07: 20:00:00 via INTRAVENOUS

## 2016-03-07 MED ORDER — SENNOSIDES-DOCUSATE SODIUM 8.6-50 MG PO TABS: 1.0000 | ORAL_TABLET | Freq: Every evening | ORAL | Status: DC | PRN

## 2016-03-07 MED ORDER — ACETAMINOPHEN 650 MG RE SUPP
650.0000 mg | RECTAL | Status: DC | PRN
Start: 2016-03-07 — End: 2016-03-09

## 2016-03-07 MED ORDER — SODIUM CHLORIDE 0.9 % IV SOLN: 250.0000 mL | INTRAVENOUS | Status: DC | PRN

## 2016-03-07 MED ORDER — ASPIRIN 81 MG PO CHEW
324.0000 mg | CHEWABLE_TABLET | Freq: Once | ORAL | Status: DC
  Filled 2016-03-07: qty 4

## 2016-03-07 MED ORDER — FLUDROCORTISONE ACETATE 0.1 MG PO TABS
0.1000 mg | ORAL_TABLET | Freq: Every day | ORAL | Status: DC
  Administered 2016-03-08 – 2016-03-09 (×2): 0.1 mg via ORAL
  Filled 2016-03-07 (×4): qty 1

## 2016-03-07 MED ORDER — ENOXAPARIN SODIUM 40 MG/0.4ML ~~LOC~~ SOLN
40.0000 mg | SUBCUTANEOUS | Status: DC
  Administered 2016-03-07 – 2016-03-08 (×2): 40 mg via SUBCUTANEOUS
  Filled 2016-03-07 (×2): qty 0.4

## 2016-03-07 MED ORDER — CLOPIDOGREL BISULFATE 75 MG PO TABS
75.0000 mg | ORAL_TABLET | Freq: Every day | ORAL | Status: DC
  Administered 2016-03-07 – 2016-03-09 (×3): 75 mg via ORAL
  Filled 2016-03-07 (×3): qty 1

## 2016-03-07 NOTE — Progress Notes (Signed)
   03/07/16 1846  Vitals  Temp 97.6 F (36.4 C)  Temp Source Oral  BP (!) 160/77 mmHg  BP Location Right Arm  BP Method Automatic  Patient Position (if appropriate) Lying  Pulse Rate 83  Pulse Rate Source Dinamap  Resp 20  Orthostatic Lying   Pulse- Lying 83  Orthostatic Sitting  BP- Sitting 154/77 mmHg  Pulse- Sitting 74  Orthostatic Standing at 0 minutes  BP- Standing at 0 minutes 154/73 mmHg  Pulse- Standing at 0 minutes 74  Oxygen Therapy  SpO2 98 %  O2 Device Room Air    Orthostatic VS as above.

## 2016-03-07 NOTE — Progress Notes (Signed)
Patient arrived to 5M13 from ED alert at this time.  Safety precaution and orders reviewed with patient. TELE applied and confirmed. Orthostatic VS in flowsheet. Will continue to monitor.   Ave Filter, RN

## 2016-03-07 NOTE — H&P (Signed)
Date: 03/07/2016               Patient Name:  Derrick Hendrix MRN: [id number]  DOB: Jan 20, 1941 Age / Sex: 75 y.o., male   PCP: [name], MD         Medical Service: Internal Medicine Teaching Service         Attending Physician: Dr. Axel Filler, MD    First Contact: Dr. Marlowe Sax Pager: [phone]  Second Contact: Dr. Randell Patient Pager: [phone]       After Hours (After 5p/  First Contact Pager: [phone]  weekends / holidays): Second Contact Pager: [phone]   Chief Complaint: dizziness, headache   History of Present Illness: Patient is a 75 year old male with a past medical history of hypertension, stroke, CKD stage III presenting to the hospital today after an episode of "dizziness" today. Patient states he was recently discharged from the hospital (03/05/16) and was feeling fine until today. States when he tried to get up from his chair after lunch today he felt dizzy/lightheaded but did not fall or lose consciousness. Denies having any CP, palpitations, or SOB. States his PO intake has been good and he been eating a lot of fruit lately. Reports taking all of the prescription medicatons he received from the pharmacy including the new ones he was prescribed at discharge. Also reports having an acute onset severe headache that started at the same time. Patient describes the headache as "thumping" and left sided frontal in location. Denies having any fevers, chills, neck stiffness, nausea, vomiting, seizures, speech difficulty, or focal weakness. He does endorse numbness on the left side of his face at present. States he was experiencing numbness of the fourth and fifth digits of his left hand prior to hospital arrival but the numbness has now resolved. States his headache has also resolved now. No other complaints. Patient was admitted on 03/02/2016 with headache and gait ataxia. He was found to have a left cerebellar stroke with dominant infarct secondary to left PICA occlusion. He was  consented for a  Stroke AF study and had a loop implanted on 03/04/2016.  Meds: Current Facility-Administered Medications  Medication Dose Route Frequency Provider Last Rate Last Dose  . 0.9 %  sodium chloride infusion  250 mL Intravenous PRN Milagros Loll, MD      . 0.9 %  sodium chloride infusion   Intravenous Continuous Milagros Loll, MD      . acetaminophen (TYLENOL) tablet 650 mg  650 mg Oral Q4H PRN Milagros Loll, MD       Or  . acetaminophen (TYLENOL) suppository 650 mg  650 mg Rectal Q4H PRN Milagros Loll, MD      . aspirin chewable tablet 324 mg  324 mg Oral Once [place], PA-C   324 mg at 03/07/16 1705  . atorvastatin (LIPITOR) tablet 40 mg  40 mg Oral q1800 Milagros Loll, MD      . clopidogrel (PLAVIX) tablet 75 mg  75 mg Oral Daily Milagros Loll, MD      . enoxaparin (LOVENOX) injection 40 mg  40 mg Subcutaneous Q24H Milagros Loll, MD      . Derrill Memo ON 03/08/2016] fludrocortisone (FLORINEF) tablet 0.1 mg  0.1 mg Oral Daily Milagros Loll, MD      . senna-docusate (Senokot-S) tablet 1 tablet  1 tablet Oral QHS PRN Milagros Loll, MD      . sodium chloride flush (NS) 0.9 % injection  3 mL  3 mL Intravenous Q12H Milagros Loll, MD      . sodium chloride flush (NS) 0.9 % injection 3 mL  3 mL Intravenous PRN Milagros Loll, MD        Allergies: Allergies as of 03/07/2016 - Review Complete 03/07/2016  Allergen Reaction Noted  . Aspirin  08/24/2014   Past Medical History  Diagnosis Date  . Hypertension   . Headache(784.0)   . Hyperkalemia 08/2014  . Stroke ([place])     hx of PAD  . Chronic kidney disease     HX OF CKD 3   Past Surgical History  Procedure Laterality Date  . Ep implantable device N/A 03/04/2016    Procedure: Loop Recorder Insertion;  Surgeon: Thompson Grayer, MD;  Location: [facility];  Service: Cardiovascular;  Laterality: N/A;   Family History  Problem Relation Age of Onset  . Hypertension Mother     died age 26    . Diabetes Mother    Social History   Social History  . Marital Status: Single    Spouse Name: N/A  . Number of Children: N/A  . Years of Education: N/A   Occupational History  . Not on file.   Social History Main Topics  . Smoking status: Current Every Day Smoker -- 1.00 packs/day for 40 years    Types: Cigarettes  . Smokeless tobacco: Never Used     Comment: but hoping to quit one day  1/2PPD  . Alcohol Use: No     Comment: Liquor once a week.  . Drug Use: No  . Sexual Activity: Not on file   Other Topics Concern  . Not on file   Social History Narrative   Works in the yard          Review of Systems: Review of Systems  Constitutional: Positive for chills. Negative for fever and malaise/fatigue.  HENT: Negative for congestion and sore throat.   Eyes: Negative for blurred vision and pain.  Respiratory: Negative for cough, shortness of breath and wheezing.   Cardiovascular: Negative for chest pain, palpitations and leg swelling.  Gastrointestinal: Negative for nausea, vomiting, abdominal pain, diarrhea and constipation.  Genitourinary: Negative for dysuria.  Musculoskeletal: Negative for myalgias.  Skin: Negative for itching and rash.  Neurological: Positive for dizziness, sensory change and headaches. Negative for focal weakness, seizures and loss of consciousness.       Burning sensation of right foot (chronic)     Physical Exam: Blood pressure 160/77, pulse 83, temperature 97.6 F (36.4 C), temperature source Oral, resp. rate 20, height 6\' 2"  (1.88 m), weight 88.2 kg (194 lb 7.1 oz), SpO2 98 %. Physical Exam  Constitutional: He is oriented to person, place, and time. He appears well-developed and well-nourished. No distress.  HENT:  Head: Normocephalic and atraumatic.  Mouth/Throat: Oropharynx is clear and moist.  Eyes: EOM are normal. Pupils are equal, round, and reactive to light.  Neck: Normal range of motion. Neck supple. No tracheal deviation present.   Cardiovascular: Normal rate, regular rhythm and intact distal pulses.  Exam reveals no gallop and no friction rub.   No murmur heard. Pulmonary/Chest: Effort normal and breath sounds normal. No respiratory distress. He has no wheezes. He has no rales.  Abdominal: Soft. Bowel sounds are normal. He exhibits no distension. There is no tenderness.  Musculoskeletal: He exhibits no edema or tenderness.  Neurological: He is alert and oriented to person, place, and time.  Sensation decreased  on the left side of face. Strength and sensation grossly intact in bilateral upper and lower extremities. Finger to finger test normal. No dysdiadochokinesia.   Skin: Skin is warm and dry. No rash noted. He is not diaphoretic. No erythema.    Lab results: Basic Metabolic Panel:  Recent Labs  03/07/16 1450 03/07/16 1601  NA 136  --   K 6.0* 4.9  CL 105  --   CO2 18*  --   GLUCOSE 101*  --   BUN 38*  --   CREATININE 1.96*  --   CALCIUM 9.3  --    CBC:  Recent Labs  03/07/16 1450  WBC 6.0  HGB 12.5*  HCT 38.9*  MCV 91.7  PLT 255   Urine Drug Screen: Drugs of Abuse     Component Value Date/Time   LABOPIA NONE DETECTED 06/26/2014 1552   COCAINSCRNUR NONE DETECTED 06/26/2014 1552   LABBENZ NONE DETECTED 06/26/2014 1552   AMPHETMU NONE DETECTED 06/26/2014 1552   THCU NONE DETECTED 06/26/2014 1552   LABBARB NONE DETECTED 06/26/2014 1552    Imaging results:  Dg Chest 2 View  03/07/2016  CLINICAL DATA:  Intermittent dizziness for 5 days.  Hypertension EXAM: CHEST  2 VIEW COMPARISON:  March 02, 2016 FINDINGS: There are nipple shadows bilaterally. There is no edema or consolidation. Heart size and pulmonary vascularity are normal. No adenopathy. There is a loop recorder on the left anteriorly and inferiorly. IMPRESSION: No edema or consolidation. Electronically Signed   By: Lowella Grip III M.D.   On: 03/07/2016 15:12   Ct Head Wo Contrast  03/07/2016  CLINICAL DATA:  Severe headache  today.  Prior stroke. EXAM: CT HEAD WITHOUT CONTRAST TECHNIQUE: Contiguous axial images were obtained from the base of the skull through the vertex without intravenous contrast. COMPARISON:  Head MRI 03/03/2016 and CT 03/02/2016 FINDINGS: Patchy hypodensities in the left cerebellum correspond to the infarcts demonstrated on recent MRI, acute at that time. Chronic lacunar infarcts are again seen in the right corona radiata and left caudate. Patchy periventricular and subcortical cerebral white matter hypodensities are compatible with moderate chronic small vessel ischemic disease. There is no evidence of new infarct, intracranial hemorrhage, mass, midline shift, or extra-axial fluid collection. Ventricles and sulci are within normal limits for age. An old left orbital floor fracture is noted. The visualized paranasal sinuses and mastoid air cells are clear. Cerumen is noted in the left greater than right external auditory canals. Calcified atherosclerosis is noted at the skullbase. IMPRESSION: 1. Subacute left cerebellar infarcts as seen on recent MRI. 2. No evidence of new intracranial abnormality. 3. Moderate chronic small vessel ischemic disease. Electronically Signed   By: Logan Bores M.D.   On: 03/07/2016 15:37    Other results: EKG: Regular rate and rhythm. No significant change since last tracing.  Assessment & Plan by Problem: Active Problems:   Headache   Dizziness  Dizziness Patient presenting with 1 day history of dizziness/lightheadedness and severe headache. Staates his headache has resolved now. He is also having numbness of the left side of his face. Reports having numbness of the fourth and fifth digits of his left hand which have resolved. He was recently admitted for left cerebellar stroke which was thought to be likely due to atherosclerotic disease. During that hospitalization, head CT demonstrated no bleeding and MRI demonstrated acute multifocal ischemic stroke in the left  cerebellum with distal V4 segment of PICA likely occluded. Carotid ultrasound showed no significant stenosis. TTE  did not show an apparent cardioembolic source. A1c 5.7. He was started on Plavix for antiplatelet therapy because he had intolerance to aspirin in the past. In addition, statin therapy was increased from moderate to high intensity regimen. CT of head today showing subacute left cerebellar infarct and no evidence of new intracranial abnormality.  We had a phone consult with Dr. Cheral Marker (neurology) and he recommended doing a TEE and repeating head CT in 72 hours. If the CT shows anything new, he recommends calling in a formal consult to neurology.  -Consult cardiology in am for loop interrogation and TEE -NS@100  cc/hr -Check orthostatic vitals  -Clopidogrel 75 mg daily -Atorvastatin 40 mg daily -Repeat head CT in 72 hours  -PT/OT evaluation -F/u am labs: BMP, CBC, serum Cr   AKI on CKD Stage III: Patient with Cr 1.96 on admission. Baseline 1.5-1.6.  -BMP in am -NS 100 mL/hr  HTN: BP 129/64 on admission. Patient is currently on amlodipine, lasix, and HCTZ at home. Reports compliance with his medications.  -Holding BP meds to allow permissive HTN   Hyporeninemic hypoaldosteronism: Patient compliant on fludrocortisone 0.1 mg daily and lasix 20 mg daily. Has been hospitalized multiple times in the past for hyperkalemia with cardiac changes. K 4.9 today. Currently asymptomatic without weakness, chest pain, SOB, or palpitations.  -Continue fludrocortisone 0.1 mg daily -Holding lasix  PAD: Patient currently on cilostazol 100 mg bid at home. Patient has leg claudication provoked on walking that requires approximately 5-10 minutes of sitting to resolve. No history of invasive vascular procedures. ABI done 06/2014 show normal blood flow on the left and moderate reduction on the right.   HLD:  -Atorvastatin 40 mg daily   Tobacco Abuse: Reports smoking 1 PPD.  -Discussed the importance of  quitting.  DVT PPx: Lovenox  Diet: heart healthy   Dispo: Disposition is deferred at this time, awaiting improvement of current medical problems. Anticipated discharge in approximately 1-2 day(s).   The patient does have a current PCP [name], MD) and does not need an [facility] hospital follow-up appointment after discharge.  The patient does not have transportation limitations that hinder transportation to clinic appointments.  Signed: Shela Leff, MD 03/07/2016, 6:57 PM

## 2016-03-07 NOTE — ED Notes (Signed)
Per EMS, Called out for dizziness. Pt is coming from .... Pt has had the dizziness intermittently since Sunday. Pt was seen here and placed a heart monitor, observed for a couple days. Pt was discharged on Wednesday. Reports today, pt had an episode of extreme dizziness whenever he moves while standing. Pt denies Chest pain, no stroke symptoms. Vitals per EMS: 128/76, 82 HR Sinus with some PVC and PACS, 16 RR, 96% on RA.

## 2016-03-07 NOTE — ED Provider Notes (Signed)
CSN: [id number]     Arrival date & time 03/07/16  1338 History   First MD Initiated Contact with Patient 03/07/16 1428     Chief Complaint  Patient presents with  . Dizziness   HPI   Derrick Hendrix is a 75 y.o. male PMH significant for hypertension, stroke, chronic kidney disease (stage III) presenting status post one episode of "dizziness" today. He describes his dizziness as more of a headache, nonvertiginous, not feeling well, worsened with movement. He does endorse some left-sided hand numbness, specifically along his fourth and fifth digits of his left hand. His headache and hand numbness have since resolved and he states he was feeling better when EMS arrived to his house. He denies any complaints currently. He was recently admitted on 03/02/2016 with headache and gait ataxia. He was found to have left cerebellar stroke with dominant infarcts 2/2 left PICA occlusion. He was consented for the Stroke AF study and had a loop implanted on 03/04/2016.   Past Medical History  Diagnosis Date  . Hypertension   . Headache(784.0)   . Hyperkalemia 08/2014  . Stroke ([place])     hx of PAD  . Chronic kidney disease     HX OF CKD 3   Past Surgical History  Procedure Laterality Date  . Ep implantable device N/A 03/04/2016    Procedure: Loop Recorder Insertion;  Surgeon: Thompson Grayer, MD;  Location: [facility];  Service: Cardiovascular;  Laterality: N/A;   Family History  Problem Relation Age of Onset  . Hypertension Mother     died age 75  . Diabetes Mother    Social History  Substance Use Topics  . Smoking status: Current Every Day Smoker -- 1.00 packs/day for 40 years    Types: Cigarettes  . Smokeless tobacco: Never Used     Comment: but hoping to quit one day  1/2PPD  . Alcohol Use: No     Comment: Liquor once a week.    Review of Systems  Ten systems are reviewed and are negative for acute change except as noted in the HPI  Allergies  Aspirin  Home Medications   Prior  to Admission medications   Medication Sig Start Date End Date Taking? Authorizing Provider  amLODipine (NORVASC) 10 MG tablet Take 1 tablet (10 mg total) by mouth daily. 01/11/16   [name], MD  atorvastatin (LIPITOR) 40 MG tablet Take 1 tablet (40 mg total) by mouth daily at 6 PM. 03/04/16   [name], MD  cilostazol (PLETAL) 100 MG tablet Take 1 tablet (100 mg total) by mouth 2 (two) times daily. 11/20/15   [name], MD  clopidogrel (PLAVIX) 75 MG tablet Take 1 tablet (75 mg total) by mouth daily. 03/04/16   [name], MD  fludrocortisone (FLORINEF) 0.1 MG tablet Take 1 tablet (0.1 mg total) by mouth daily. 07/19/15   [name], MD  furosemide (LASIX) 40 MG tablet TAKE 0.5 TABLETS (20 MG TOTAL) BY MOUTH DAILY. 12/12/15   Sid Falcon, MD  hydrochlorothiazide (HYDRODIURIL) 12.5 MG tablet Take 12.5 mg by mouth daily. 02/13/16   Historical Provider, MD  ipratropium (ATROVENT) 0.06 % nasal spray Place 2 sprays into the nose 3 (three) times daily. 02/06/15   Luan Moore, MD  nitroGLYCERIN (NITROSTAT) 0.4 MG SL tablet Place 1 tablet (0.4 mg total) under the tongue every 5 (five) minutes x 3 doses as needed for chest pain. 11/29/14   Luan Moore, MD  polyethylene glycol (MIRALAX) packet Take 17 g by mouth daily. Patient taking differently: Take 17 g by mouth daily as needed for mild constipation.  11/29/14   Luan Moore, MD  terazosin (HYTRIN) 1 MG capsule TAKE 1 CAPSULE (1 MG TOTAL) BY MOUTH AT BEDTIME. 02/13/16   [name], MD   BP 129/64 mmHg  Pulse 72  Temp(Src) 97.6 F (36.4 C) (Oral)  Resp 19  SpO2 98% Physical Exam  Constitutional: He is oriented to person, place, and time. He appears well-developed and well-nourished. No distress.  Disheveled  HENT:  Head: Normocephalic and atraumatic.  Mouth/Throat: Oropharynx is clear and moist. No oropharyngeal exudate.  Eyes: Conjunctivae are normal. Pupils are equal, round, and reactive to light.  Right eye exhibits no discharge. Left eye exhibits no discharge. No scleral icterus.  Neck: No tracheal deviation present.  Cardiovascular: Normal rate, regular rhythm, normal heart sounds and intact distal pulses.  Exam reveals no gallop and no friction rub.   No murmur heard. Pulmonary/Chest: Effort normal and breath sounds normal. No respiratory distress. He has no wheezes. He has no rales. He exhibits no tenderness.  Abdominal: Soft. Bowel sounds are normal. He exhibits no distension and no mass. There is no tenderness. There is no rebound and no guarding.  Musculoskeletal: Normal range of motion. He exhibits no edema or tenderness.  Lymphadenopathy:    He has no cervical adenopathy.  Neurological: He is alert and oriented to person, place, and time. No cranial nerve deficit. Coordination normal.  Cranial nerves II through XII intact, normal finger to nose, RAM, pronator drift.  Skin: Skin is warm and dry. No rash noted. He is not diaphoretic. No erythema.  Psychiatric: He has a normal mood and affect. His behavior is normal.  Nursing note and vitals reviewed.   ED Course  Procedures Labs Review Labs Reviewed  CBC - Abnormal; Notable for the following:    Hemoglobin 12.5 (*)    HCT 38.9 (*)    All other components within normal limits  BASIC METABOLIC PANEL  Randolm Idol, ED   Imaging Review Dg Chest 2 View  03/07/2016  CLINICAL DATA:  Intermittent dizziness for 5 days.  Hypertension EXAM: CHEST  2 VIEW COMPARISON:  March 02, 2016 FINDINGS: There are nipple shadows bilaterally. There is no edema or consolidation. Heart size and pulmonary vascularity are normal. No adenopathy. There is a loop recorder on the left anteriorly and inferiorly. IMPRESSION: No edema or consolidation. Electronically Signed   By: Lowella Grip III M.D.   On: 03/07/2016 15:12   Ct Head Wo Contrast  03/07/2016  CLINICAL DATA:  Severe headache today.  Prior stroke. EXAM: CT HEAD WITHOUT CONTRAST  TECHNIQUE: Contiguous axial images were obtained from the base of the skull through the vertex without intravenous contrast. COMPARISON:  Head MRI 03/03/2016 and CT 03/02/2016 FINDINGS: Patchy hypodensities in the left cerebellum correspond to the infarcts demonstrated on recent MRI, acute at that time. Chronic lacunar infarcts are again seen in the right corona radiata and left caudate. Patchy periventricular and subcortical cerebral white matter hypodensities are compatible with moderate chronic small vessel ischemic disease. There is no evidence of new infarct, intracranial hemorrhage, mass, midline shift, or extra-axial fluid collection. Ventricles and sulci are within normal limits for age. An old left orbital floor fracture is noted. The visualized paranasal sinuses and mastoid air cells are clear. Cerumen is noted in the left greater than right external auditory canals. Calcified atherosclerosis is noted at the  skullbase. IMPRESSION: 1. Subacute left cerebellar infarcts as seen on recent MRI. 2. No evidence of new intracranial abnormality. 3. Moderate chronic small vessel ischemic disease. Electronically Signed   By: Logan Bores M.D.   On: 03/07/2016 15:37   I have personally reviewed and evaluated these images and lab results as part of my medical decision-making.   EKG Interpretation   Date/Time:  Friday March 07 2016 13:57:47 EDT Ventricular Rate:  69 PR Interval:  163 QRS Duration: 93 QT Interval:  388 QTC Calculation: 416 R Axis:   68 Text Interpretation:  Sinus rhythm Consider left ventricular hypertrophy  Early repolarization No significant change since last tracing Confirmed by  KNOTT MD, DANIEL [id number]) on 03/07/2016 2:45:31 PM      MDM   Final diagnoses:  Headache, unspecified headache type   Patient nontoxic appearing, VSS. Broad workup for dizziness- infectious vs cardiac vs neuro initiated. CXR, CT head, CBC, troponin, EKG unremarkable for acute change. BMP demonstrates  hyperkalemia of 6, SCr of 1.96 (baseline around 1.6). Will repeat potassium. Discussed case with neurology who advised a TEE and a 72 hour follow-up head CT. Neurology is concerned regarding a TIA post CVA. Patient will need admission for further dizziness/headache workup given recent CVA history. Orders were placed for consultation with cardiology so the loop recorder could be interrogated; however, cardiology did not return my call prior to admission.  Amherst Lions, PA-C 03/10/16 1223  Leo Grosser, MD 03/11/16 [phone]

## 2016-03-08 DIAGNOSIS — I638 Other cerebral infarction: Secondary | ICD-10-CM

## 2016-03-08 DIAGNOSIS — N183 Chronic kidney disease, stage 3 (moderate): Secondary | ICD-10-CM

## 2016-03-08 DIAGNOSIS — E2740 Unspecified adrenocortical insufficiency: Secondary | ICD-10-CM

## 2016-03-08 DIAGNOSIS — I739 Peripheral vascular disease, unspecified: Secondary | ICD-10-CM

## 2016-03-08 DIAGNOSIS — R42 Dizziness and giddiness: Secondary | ICD-10-CM | POA: Diagnosis not present

## 2016-03-08 DIAGNOSIS — I129 Hypertensive chronic kidney disease with stage 1 through stage 4 chronic kidney disease, or unspecified chronic kidney disease: Secondary | ICD-10-CM | POA: Diagnosis not present

## 2016-03-08 DIAGNOSIS — E785 Hyperlipidemia, unspecified: Secondary | ICD-10-CM

## 2016-03-08 DIAGNOSIS — I63532 Cerebral infarction due to unspecified occlusion or stenosis of left posterior cerebral artery: Secondary | ICD-10-CM | POA: Insufficient documentation

## 2016-03-08 DIAGNOSIS — N179 Acute kidney failure, unspecified: Secondary | ICD-10-CM

## 2016-03-08 DIAGNOSIS — F17200 Nicotine dependence, unspecified, uncomplicated: Secondary | ICD-10-CM

## 2016-03-08 LAB — CBC
HCT: 34.4 % — ABNORMAL LOW (ref 39.0–52.0)
HEMOGLOBIN: 10.8 g/dL — AB (ref 13.0–17.0)
MCH: 28.6 pg (ref 26.0–34.0)
MCHC: 31.4 g/dL (ref 30.0–36.0)
MCV: 91.2 fL (ref 78.0–100.0)
Platelets: 252 10*3/uL (ref 150–400)
RBC: 3.77 MIL/uL — AB (ref 4.22–5.81)
RDW: 14.9 % (ref 11.5–15.5)
WBC: 5.9 10*3/uL (ref 4.0–10.5)

## 2016-03-08 LAB — BASIC METABOLIC PANEL
ANION GAP: 10 (ref 5–15)
BUN: 42 mg/dL — ABNORMAL HIGH (ref 6–20)
CALCIUM: 8.7 mg/dL — AB (ref 8.9–10.3)
CO2: 21 mmol/L — AB (ref 22–32)
CREATININE: 1.73 mg/dL — AB (ref 0.61–1.24)
Chloride: 109 mmol/L (ref 101–111)
GFR, EST AFRICAN AMERICAN: 42 mL/min — AB (ref >60)
GFR, EST NON AFRICAN AMERICAN: 36 mL/min — AB (ref >60)
Glucose, Bld: 77 mg/dL (ref 65–99)
Potassium: 5.4 mmol/L — ABNORMAL HIGH (ref 3.5–5.1)
SODIUM: 140 mmol/L (ref 135–145)

## 2016-03-08 MED ORDER — FUROSEMIDE 20 MG PO TABS
20.0000 mg | ORAL_TABLET | Freq: Every day | ORAL | Status: DC
  Administered 2016-03-08 – 2016-03-09 (×2): 20 mg via ORAL
  Filled 2016-03-08 (×2): qty 1

## 2016-03-08 NOTE — Progress Notes (Signed)
Asked to see pt to interrogate loop recorder given symptoms of dizziness  Device demonstrates NO atrial fibrillation  Some PACs but these are this am and not noted previously  Call for futher question

## 2016-03-08 NOTE — Progress Notes (Signed)
Subjective: Feels back to normal this AM. No further dizziness or headache. Says he felt dizzy yesterday after he took his AM medications.   Objective: Vital signs in last 24 hours: Filed Vitals:   03/08/16 0030 03/08/16 0230 03/08/16 0430 03/08/16 0630  BP: 131/68 140/76 135/72 138/74  Pulse: 79     Temp: 98 F (36.7 C) 98 F (36.7 C) 98 F (36.7 C) 98.4 F (36.9 C)  TempSrc: Oral Oral Oral Oral  Resp: 18 18 18 18   Height:      Weight:      SpO2:       Weight change:   Intake/Output Summary (Last 24 hours) at 03/08/16 [id number] Last data filed at 03/07/16 1854  Gross per 24 hour  Intake    240 ml  Output      0 ml  Net    240 ml   Physical Exam: General: Elderly AA male, alert, cooperative, NAD.  HEENT: PERRL, EOMI. Moist mucus membranes. Senile arcus.  Neck: Full range of motion without pain, supple, no lymphadenopathy or carotid bruits Lungs: Clear to ascultation bilaterally, normal work of respiration, no wheezes, rales, rhonchi Heart: RRR, no murmurs, gallops, or rubs Abdomen: Soft, non-tender, non-distended, BS + Extremities: No cyanosis, clubbing, or edema Neurologic: Alert & oriented x3, cranial nerves II-XII intact, strength grossly intact, sensation intact to light touch. No cerebellar deficits on exam. Mild stuttered speech.    Lab Results: Basic Metabolic Panel:  Recent Labs Lab 03/07/16 1450 03/07/16 1601 03/08/16 0630  NA 136  --  140  K 6.0* 4.9 5.4*  CL 105  --  109  CO2 18*  --  21*  GLUCOSE 101*  --  77  BUN 38*  --  42*  CREATININE 1.96*  --  1.73*  CALCIUM 9.3  --  8.7*   Liver Function Tests:  Recent Labs Lab 03/02/16 1815  AST 18  ALT 12*  ALKPHOS 93  BILITOT 0.6  PROT 7.8  ALBUMIN 3.9   CBC:  Recent Labs Lab 03/02/16 1815 03/07/16 1450 03/08/16 0630  WBC 7.1 6.0 5.9  NEUTROABS 5.0  --   --   HGB 12.1* 12.5* 10.8*  HCT 38.7* 38.9* 34.4*  MCV 90.2 91.7 91.2  PLT 310 255 252   Hemoglobin A1C:  Recent Labs Lab  03/03/16 0521  HGBA1C 5.7*   Fasting Lipid Panel:  Recent Labs Lab 03/03/16 0521  CHOL 149  HDL 41  LDLCALC 95  TRIG 65  CHOLHDL 3.6   Urine Drug Screen: Drugs of Abuse     Component Value Date/Time   LABOPIA NONE DETECTED 06/26/2014 1552   COCAINSCRNUR NONE DETECTED 06/26/2014 1552   LABBENZ NONE DETECTED 06/26/2014 1552   AMPHETMU NONE DETECTED 06/26/2014 1552   THCU NONE DETECTED 06/26/2014 1552   LABBARB NONE DETECTED 06/26/2014 1552    Urinalysis:  Recent Labs Lab 03/02/16 2026  COLORURINE YELLOW  LABSPEC 1.011  PHURINE 5.5  GLUCOSEU NEGATIVE  HGBUR NEGATIVE  BILIRUBINUR NEGATIVE  KETONESUR NEGATIVE  PROTEINUR NEGATIVE  NITRITE NEGATIVE  LEUKOCYTESUR NEGATIVE   Studies/Results: Dg Chest 2 View  03/07/2016  CLINICAL DATA:  Intermittent dizziness for 5 days.  Hypertension EXAM: CHEST  2 VIEW COMPARISON:  March 02, 2016 FINDINGS: There are nipple shadows bilaterally. There is no edema or consolidation. Heart size and pulmonary vascularity are normal. No adenopathy. There is a loop recorder on the left anteriorly and inferiorly. IMPRESSION: No edema or consolidation. Electronically Signed  By: Lowella Grip III M.D.   On: 03/07/2016 15:12   Ct Head Wo Contrast  03/07/2016  CLINICAL DATA:  Severe headache today.  Prior stroke. EXAM: CT HEAD WITHOUT CONTRAST TECHNIQUE: Contiguous axial images were obtained from the base of the skull through the vertex without intravenous contrast. COMPARISON:  Head MRI 03/03/2016 and CT 03/02/2016 FINDINGS: Patchy hypodensities in the left cerebellum correspond to the infarcts demonstrated on recent MRI, acute at that time. Chronic lacunar infarcts are again seen in the right corona radiata and left caudate. Patchy periventricular and subcortical cerebral white matter hypodensities are compatible with moderate chronic small vessel ischemic disease. There is no evidence of new infarct, intracranial hemorrhage, mass, midline shift, or  extra-axial fluid collection. Ventricles and sulci are within normal limits for age. An old left orbital floor fracture is noted. The visualized paranasal sinuses and mastoid air cells are clear. Cerumen is noted in the left greater than right external auditory canals. Calcified atherosclerosis is noted at the skullbase. IMPRESSION: 1. Subacute left cerebellar infarcts as seen on recent MRI. 2. No evidence of new intracranial abnormality. 3. Moderate chronic small vessel ischemic disease. Electronically Signed   By: Logan Bores M.D.   On: 03/07/2016 15:37   Medications: I have reviewed the patient's current medications. Scheduled Meds: . atorvastatin  40 mg Oral q1800  . clopidogrel  75 mg Oral Daily  . enoxaparin (LOVENOX) injection  40 mg Subcutaneous Q24H  . fludrocortisone  0.1 mg Oral Daily  . furosemide  20 mg Oral Daily  . sodium chloride flush  3 mL Intravenous Q12H   Continuous Infusions:  PRN Meds:.sodium chloride, acetaminophen **OR** acetaminophen, senna-docusate, sodium chloride flush   Assessment/Plan: 75 y/o M w/ PMHx of HTN, h/o CVA, and CKD stage 3, admitted for dizziness. Recent admission for posterior circulation stroke.   Dizziness: Presented with symptoms of dizziness, lightheadedness, headache, left-sided facial numbness, and left 5th finger numbness. Symptoms have resolved. Recent hospitalization for posterior/cerebellar infarct, thought to be 2/2 atherosclerotic/small vessel disease. Started on Plavix, increased Lipitor. CT of head on re-admission showed subacute left cerebellar infarct and no evidence of new intracranial abnormality. Orthostatics negative. -Loop interrogation. If no evidence of atrial fibrillation, consider repeat MRI. If evidence of atrial fibrillation, can start anticoagulation.  -Continue Plavix 75 mg daily -Lipitor 40 mg qhs -PT/OT evaluation -D/c IVF  AKI on CKD Stage III: Patient with Cr 1.96 on admission, improved to 1.73 this AM. Baseline  1.5-1.6.  -D/c IVF -Repeat BMP in AM  HTN: Stable.  -Holding BP meds for now to allow permissive HTN   Hyporeninemic Hypoaldosteronism: Patient compliant on fludrocortisone 0.1 mg daily and Lasix 20 mg daily. Has been hospitalized multiple times in the past for hyperkalemia with cardiac changes. K 5.4 this AM.  -Continue fludrocortisone 0.1 mg daily -Resume Lasix 20 mg daily  PAD: Patient on cilostazol 100 mg bid at home prior to previous admission. Patient has leg claudication provoked on walking that requires approximately 5-10 minutes of sitting to resolve. No history of invasive vascular procedures. ABI done 06/2014 show normal blood flow on the left and moderate reduction on the right. -Stop Pletal, continue Plavix  HLD: Stable.  -Atorvastatin 40 mg daily   Tobacco Abuse: Reports smoking 1 pack per day.  -Discussed the importance of quitting.  DVT PPx: Lovenox  Dispo: Disposition is deferred at this time, awaiting improvement of current medical problems.  Anticipated discharge in approximately 1-2 day(s).   The patient does have  a current PCP [name], MD) and does need an [facility] hospital follow-up appointment after discharge.  The patient does not have transportation limitations that hinder transportation to clinic appointments.  .Services Needed at time of discharge: Y = Yes, Blank = No PT:   OT:   RN:   Equipment:   Other:       Corky Sox, MD 03/08/2016, 9:39 AM

## 2016-03-08 NOTE — Evaluation (Signed)
Physical Therapy Evaluation Patient Details Name: Derrick Hendrix MRN: [id number] DOB: 1941-10-18 Today's Date: 03/08/2016   History of Present Illness  75 year old man with extensive peripheral artery disease admitted to our service last week with a acute left sided cerebellar infarction. He was discharged with Plavix and atorvastatin for secondary prevention. He was feeling well until yesterday when he suddenly had onset of similar dizziness that brought him into the hospital on the initial admission. Also complained of some numbness in his left face.  Clinical Impression  Pt admitted with above. Pt functioning at supervision level, near baseline. Pt with mild fall risk as indicated by score of 17 on DGI. Acute PT to follow to address higher level balance.    Follow Up Recommendations No PT follow up;Supervision - Intermittent (patient declines any need for PT services)    Equipment Recommendations  None recommended by PT    Recommendations for Other Services       Precautions / Restrictions Precautions Precautions: None Restrictions Weight Bearing Restrictions: No      Mobility  Bed Mobility Overal bed mobility: Independent             General bed mobility comments: bed flat  Transfers Overall transfer level: Independent Equipment used: None             General transfer comment: pt with safe technique  Ambulation/Gait Ambulation/Gait assistance: Supervision Ambulation Distance (Feet): 300 Feet Assistive device: None Gait Pattern/deviations: Step-through pattern (bilat LEs externally rotated) Gait velocity: wfl Gait velocity interpretation: at or above normal speed for age/gender General Gait Details: no episodes of LOB  Stairs Stairs: Yes Stairs assistance: Supervision Stair Management: One rail Right Number of Stairs: 10 General stair comments: safe technique  Wheelchair Mobility    Modified Rankin (Stroke Patients Only) Modified Rankin (Stroke  Patients Only) Pre-Morbid Rankin Score: No symptoms Modified Rankin: No significant disability     Balance                                 Standardized Balance Assessment Standardized Balance Assessment : Dynamic Gait Index   Dynamic Gait Index Level Surface: Normal Change in Gait Speed: Mild Impairment Gait with Horizontal Head Turns: Mild Impairment Gait with Vertical Head Turns: Mild Impairment Gait and Pivot Turn: Mild Impairment Step Over Obstacle: Mild Impairment Step Around Obstacles: Mild Impairment Steps: Mild Impairment Total Score: 17       Pertinent Vitals/Pain Pain Assessment: No/denies pain    Home Living Family/patient expects to be discharged to:: Private residence Living Arrangements: Alone Available Help at Discharge: Friend(s);Available PRN/intermittently Type of Home: Apartment Home Access: Stairs to enter Entrance Stairs-Rails: Can reach both Entrance Stairs-Number of Steps: 4 Home Layout: One level Home Equipment: None Additional Comments: Pt reports that he has friends that stop by to check on him 2-3 times a day.     Prior Function Level of Independence: Independent               Hand Dominance   Dominant Hand: Right    Extremity/Trunk Assessment   Upper Extremity Assessment: Overall WFL for tasks assessed           Lower Extremity Assessment: Overall WFL for tasks assessed      Cervical / Trunk Assessment:  (forward head)  Communication   Communication: No difficulties  Cognition Arousal/Alertness: Awake/alert Behavior During Therapy: WFL for tasks assessed/performed Overall Cognitive Status: Within Functional Limits for  tasks assessed                      General Comments General comments (skin integrity, edema, etc.): pt tolieting self independently    Exercises        Assessment/Plan    PT Assessment Patient needs continued PT services  PT Diagnosis Generalized weakness   PT Problem  List Decreased balance;Decreased strength;Decreased activity tolerance  PT Treatment Interventions Gait training;Stair training;Functional mobility training;Therapeutic activities;Therapeutic exercise;Balance training;Neuromuscular re-education   PT Goals (Current goals can be found in the Care Plan section) Acute Rehab PT Goals Patient Stated Goal: go back home PT Goal Formulation: With patient Time For Goal Achievement: 03/15/16 Potential to Achieve Goals: Good Additional Goals Additional Goal #1: Pt to score > 41 on Berg Balance test to indicate low fall risk.    Frequency Min 2X/week   Barriers to discharge        Co-evaluation               End of Session Equipment Utilized During Treatment: Gait belt Activity Tolerance: Patient tolerated treatment well Patient left: with call bell/phone within reach;in bed;with bed alarm set Nurse Communication: Mobility status    Functional Assessment Tool Used: clinical judgment Functional Limitation: Mobility: Walking and moving around Mobility: Walking and Moving Around Current Status [id number]): At least 1 percent but less than 20 percent impaired, limited or restricted Mobility: Walking and Moving Around Goal Status [phone]): At least 1 percent but less than 20 percent impaired, limited or restricted    Time: [id number] PT Time Calculation (min) (ACUTE ONLY): 24 min   Charges:   PT Evaluation $PT Eval Low Complexity: 1 Procedure PT Treatments $Gait Training: 8-22 mins   PT G Codes:   PT G-Codes **NOT FOR INPATIENT CLASS** Functional Assessment Tool Used: clinical judgment Functional Limitation: Mobility: Walking and moving around Mobility: Walking and Moving Around Current Status [id number]): At least 1 percent but less than 20 percent impaired, limited or restricted Mobility: Walking and Moving Around Goal Status [phone]): At least 1 percent but less than 20 percent impaired, limited or restricted    Kingsley Callander 03/08/2016, 1:50 PM   Kittie Plater, PT, DPT Pager #: [phone] Office #: [phone]

## 2016-03-08 NOTE — Progress Notes (Signed)
Internal Medicine Attending:   I saw and examined the patient. I reviewed the resident's note and I agree with the resident's findings and plan as documented in the resident's note.  75 year old man with peripheral artery disease and a recent acute left cerebellar infarction was readmitted because of return of CVA symptoms that included gait unsteadiness and dizziness. No complaints this morning. Symptoms of dizziness remain resolved since last night. Neuro exam is reassuring. His symptoms are still consistent with the distribution of the initial CVA. CT head ruled out hemorrhagic conversion. It seems unlikely that he has a new acute CVA. Perhaps when he restarted his home antihypertensives his peri-infarct perfusion decreased enough to cause his symptoms to return. I agree with holding home amlodipine, HCTZ, and tamsulosin for now. Continue plavix and atorvastatin. Will need repeat PT and OT consults. If he develops new neurologic deficits I would pursue MRI, but currently his deficits are resolved.

## 2016-03-09 DIAGNOSIS — R42 Dizziness and giddiness: Secondary | ICD-10-CM | POA: Diagnosis not present

## 2016-03-09 DIAGNOSIS — I63532 Cerebral infarction due to unspecified occlusion or stenosis of left posterior cerebral artery: Secondary | ICD-10-CM

## 2016-03-09 DIAGNOSIS — I129 Hypertensive chronic kidney disease with stage 1 through stage 4 chronic kidney disease, or unspecified chronic kidney disease: Secondary | ICD-10-CM | POA: Diagnosis not present

## 2016-03-09 DIAGNOSIS — R51 Headache: Secondary | ICD-10-CM

## 2016-03-09 DIAGNOSIS — N183 Chronic kidney disease, stage 3 (moderate): Secondary | ICD-10-CM | POA: Diagnosis not present

## 2016-03-09 LAB — BASIC METABOLIC PANEL
ANION GAP: 9 (ref 5–15)
BUN: 41 mg/dL — ABNORMAL HIGH (ref 6–20)
CHLORIDE: 109 mmol/L (ref 101–111)
CO2: 22 mmol/L (ref 22–32)
Calcium: 9.3 mg/dL (ref 8.9–10.3)
Creatinine, Ser: 1.64 mg/dL — ABNORMAL HIGH (ref 0.61–1.24)
GFR calc non Af Amer: 39 mL/min — ABNORMAL LOW (ref >60)
GFR, EST AFRICAN AMERICAN: 45 mL/min — AB (ref >60)
Glucose, Bld: 94 mg/dL (ref 65–99)
Potassium: 5.4 mmol/L — ABNORMAL HIGH (ref 3.5–5.1)
Sodium: 140 mmol/L (ref 135–145)

## 2016-03-09 NOTE — Progress Notes (Signed)
Pt d/c to home by car with friend. Assessment stable. D/c instructions given and all questions answered.

## 2016-03-09 NOTE — Discharge Instructions (Signed)
Please take your medications as directed. We will call your pharmacy tomorrow to have them change your medication packets.   Make sure to go pick up your new medication packets tomorrow.

## 2016-03-09 NOTE — Progress Notes (Signed)
OT Cancellation Note  Patient Details Name: Derrick Hendrix MRN: [id number] DOB: [birth year]   Cancelled Treatment:    Reason Eval/Treat Not Completed: OT screened, no needs identified, will sign off. Pt reports he feels all of his symptoms have resolved with no residual dizziness, vision changes, or weakness. No acute OT needs identified; signing off at this time. Please re-consult if needs change. Thank you for this referral.   Binnie Kand M.S., OTR/L Pager: [phone]  03/09/2016, 11:51 AM

## 2016-03-09 NOTE — Progress Notes (Signed)
Subjective: States he had burning rt foot pain this morning that self resolved.   Objective: Vital signs in last 24 hours: Filed Vitals:   03/08/16 2125 03/09/16 0156 03/09/16 0809 03/09/16 0956  BP: 142/57 128/79 130/86 119/62  Pulse: 84 74 82 78  Temp: 98.1 F (36.7 C) 98.5 F (36.9 C) 98.8 F (37.1 C) 98 F (36.7 C)  TempSrc: Oral Oral Oral Oral  Resp: 16 16 16 20   Height:      Weight:      SpO2: 94% 95% 96% 98%   Weight change:   Intake/Output Summary (Last 24 hours) at 03/09/16 1045 Last data filed at 03/09/16 0157  Gross per 24 hour  Intake      0 ml  Output    575 ml  Net   -575 ml   Physical Exam: General: Elderly AA male, alert, cooperative, NAD.  HEENT: PERRL, EOMI. Moist mucus membranes. Senile arcus.  Lungs: Clear to ascultation bilaterally, normal work of respiration, no wheezes, rales, rhonchi Heart: RRR, no murmurs, gallops, or rubs Abdomen: Soft, non-tender, non-distended, BS + Extremities: No cyanosis, clubbing, or edema Neurologic:cranial nerves II-XII intact  Lab Results: Basic Metabolic Panel:  Recent Labs Lab 03/08/16 0630 03/09/16 0538  NA 140 140  K 5.4* 5.4*  CL 109 109  CO2 21* 22  GLUCOSE 77 94  BUN 42* 41*  CREATININE 1.73* 1.64*  CALCIUM 8.7* 9.3   Liver Function Tests:  Recent Labs Lab 03/02/16 1815  AST 18  ALT 12*  ALKPHOS 93  BILITOT 0.6  PROT 7.8  ALBUMIN 3.9   CBC:  Recent Labs Lab 03/02/16 1815 03/07/16 1450 03/08/16 0630  WBC 7.1 6.0 5.9  NEUTROABS 5.0  --   --   HGB 12.1* 12.5* 10.8*  HCT 38.7* 38.9* 34.4*  MCV 90.2 91.7 91.2  PLT 310 255 252   Hemoglobin A1C:  Recent Labs Lab 03/03/16 0521  HGBA1C 5.7*   Fasting Lipid Panel:  Recent Labs Lab 03/03/16 0521  CHOL 149  HDL 41  LDLCALC 95  TRIG 65  CHOLHDL 3.6     Studies/Results: Dg Chest 2 View  03/07/2016  CLINICAL DATA:  Intermittent dizziness for 5 days.  Hypertension EXAM: CHEST  2 VIEW COMPARISON:  March 02, 2016 FINDINGS:  There are nipple shadows bilaterally. There is no edema or consolidation. Heart size and pulmonary vascularity are normal. No adenopathy. There is a loop recorder on the left anteriorly and inferiorly. IMPRESSION: No edema or consolidation. Electronically Signed   By: Lowella Grip III M.D.   On: 03/07/2016 15:12   Ct Head Wo Contrast  03/07/2016  CLINICAL DATA:  Severe headache today.  Prior stroke. EXAM: CT HEAD WITHOUT CONTRAST TECHNIQUE: Contiguous axial images were obtained from the base of the skull through the vertex without intravenous contrast. COMPARISON:  Head MRI 03/03/2016 and CT 03/02/2016 FINDINGS: Patchy hypodensities in the left cerebellum correspond to the infarcts demonstrated on recent MRI, acute at that time. Chronic lacunar infarcts are again seen in the right corona radiata and left caudate. Patchy periventricular and subcortical cerebral white matter hypodensities are compatible with moderate chronic small vessel ischemic disease. There is no evidence of new infarct, intracranial hemorrhage, mass, midline shift, or extra-axial fluid collection. Ventricles and sulci are within normal limits for age. An old left orbital floor fracture is noted. The visualized paranasal sinuses and mastoid air cells are clear. Cerumen is noted in the left greater than right external auditory canals. Calcified  atherosclerosis is noted at the skullbase. IMPRESSION: 1. Subacute left cerebellar infarcts as seen on recent MRI. 2. No evidence of new intracranial abnormality. 3. Moderate chronic small vessel ischemic disease. Electronically Signed   By: Logan Bores M.D.   On: 03/07/2016 15:37   Medications: I have reviewed the patient's current medications. Scheduled Meds: . atorvastatin  40 mg Oral q1800  . clopidogrel  75 mg Oral Daily  . enoxaparin (LOVENOX) injection  40 mg Subcutaneous Q24H  . fludrocortisone  0.1 mg Oral Daily  . furosemide  20 mg Oral Daily  . sodium chloride flush  3 mL  Intravenous Q12H   Continuous Infusions:  PRN Meds:.sodium chloride, acetaminophen **OR** acetaminophen, senna-docusate, sodium chloride flush   Assessment/Plan: 75 y/o M w/ PMHx of HTN, h/o CVA, and CKD stage 3, admitted for dizziness. Recent admission for posterior circulation stroke.   Dizziness: resolved. Loop interrogation neg for afib. Back at b/l this morning thus will not get a MRI -Continue Plavix 75 mg daily -Lipitor 40 mg qhs - stable for d/c home today. Will hold BP meds except for lasix which he needs for his hyperkalemia.  - will call [facility] tomorrow morning to have his pill packets changed, pt states he his neighbor can take him to the pharmacy tomorrow to pick up his new pill packets.   AKI on CKD Stage III: resolved. Cr 1.64 this morning. Baseline 1.5-1.6.   HTN:119/62 this morining.  -Holding BP meds.   Hyporeninemic Hypoaldosteronism:  on fludrocortisone 0.1 mg daily and Lasix 20 mg daily. K 5.4 this AM.  -Continue fludrocortisone 0.1 mg daily - Lasix 20 mg daily - getting EKG, if no EKG changes and d/c home w/ repeat BMET at outpatient clinic visit 4/3  PAD: Patient on cilostazol 100 mg bid at home prior to previous admission. Burning rt foot pain likely from holding his home cilostazol.  -Stop Pletal, continue Plavix  HLD:  -Atorvastatin 40 mg daily   Tobacco Abuse: Reports smoking 1 pack per day.  -Discussed the importance of quitting.  DVT PPx: Lovenox  Dispo: d/c home today  The patient does have a current PCP [name], MD) and has an appt w/ Dr. Benjamine Mola on 03/17/16  The patient does not have transportation limitations that hinder transportation to clinic appointments.  .Services Needed at time of discharge: Y = Yes, Blank = No PT:   OT:   RN:   Equipment:   Other:       Norman Herrlich, MD 03/09/2016, 10:45 AM

## 2016-03-09 NOTE — Discharge Summary (Signed)
Name: Derrick Hendrix MRN: [id number] DOB: 01-16-1941 75 y.o. PCP: [name], MD  Date of Admission: 03/07/2016  1:38 PM Date of Discharge: 03/09/2016 Attending Physician: Axel Filler, MD  Discharge Diagnosis: Active Problems:   Headache   Dizziness   Cerebrovascular accident (CVA) due to occlusion of left posterior cerebral artery [facility])  Discharge Medications:   Medication List    STOP taking these medications        amLODipine 10 MG tablet  Commonly known as:  NORVASC     cilostazol 100 MG tablet  Commonly known as:  PLETAL     hydrochlorothiazide 12.5 MG tablet  Commonly known as:  HYDRODIURIL     simvastatin 20 MG tablet  Commonly known as:  ZOCOR     terazosin 1 MG capsule  Commonly known as:  HYTRIN      TAKE these medications        atorvastatin 40 MG tablet  Commonly known as:  LIPITOR  Take 40 mg by mouth daily. For 10 days, ending 03-15-16     clopidogrel 75 MG tablet  Commonly known as:  PLAVIX  Take 1 tablet (75 mg total) by mouth daily.     fludrocortisone 0.1 MG tablet  Commonly known as:  FLORINEF  Take 1 tablet (0.1 mg total) by mouth daily.     furosemide 40 MG tablet  Commonly known as:  LASIX  TAKE 0.5 TABLETS (20 MG TOTAL) BY MOUTH DAILY.     ipratropium 0.06 % nasal spray  Commonly known as:  ATROVENT  Place 2 sprays into the nose 3 (three) times daily.     nitroGLYCERIN 0.4 MG SL tablet  Commonly known as:  NITROSTAT  Place 1 tablet (0.4 mg total) under the tongue every 5 (five) minutes x 3 doses as needed for chest pain.     polyethylene glycol packet  Commonly known as:  MIRALAX  Take 17 g by mouth daily.        Disposition and follow-up:   [name] was discharged from [facility] in Good condition.  At the hospital follow up visit please address:  1.  Please check patient's blood pressure and slowly resume BP meds as needed.   2.  Labs / imaging needed at time of follow-up:  none  3.  Pending labs/ test needing follow-up: none      Procedures Performed:  Dg Chest 2 View  03/07/2016  CLINICAL DATA:  Intermittent dizziness for 5 days.  Hypertension EXAM: CHEST  2 VIEW COMPARISON:  March 02, 2016 FINDINGS: There are nipple shadows bilaterally. There is no edema or consolidation. Heart size and pulmonary vascularity are normal. No adenopathy. There is a loop recorder on the left anteriorly and inferiorly. IMPRESSION: No edema or consolidation. Electronically Signed   By: Lowella Grip III M.D.   On: 03/07/2016 15:12   Dg Chest 2 View  03/02/2016  CLINICAL DATA:  Dizziness and near syncope for 4 days. Dizzy when standing. History of hypertension and stroke. EXAM: CHEST  2 VIEW COMPARISON:  06/27/2014 FINDINGS: Mild hyperinflation suggesting emphysema. No focal airspace disease or consolidation in the lungs. Normal heart size and pulmonary vascularity. Nodular opacities projected over the mid lungs consistent with prominent nipple shadows. No blunting of costophrenic angles. No pneumothorax. IMPRESSION: Mild emphysematous changes in the lungs. No evidence of active pulmonary disease. Electronically Signed   By: Lucienne Capers M.D.   On: 03/02/2016 18:45   Ct Head  Wo Contrast  03/07/2016  CLINICAL DATA:  Severe headache today.  Prior stroke. EXAM: CT HEAD WITHOUT CONTRAST TECHNIQUE: Contiguous axial images were obtained from the base of the skull through the vertex without intravenous contrast. COMPARISON:  Head MRI 03/03/2016 and CT 03/02/2016 FINDINGS: Patchy hypodensities in the left cerebellum correspond to the infarcts demonstrated on recent MRI, acute at that time. Chronic lacunar infarcts are again seen in the right corona radiata and left caudate. Patchy periventricular and subcortical cerebral white matter hypodensities are compatible with moderate chronic small vessel ischemic disease. There is no evidence of new infarct, intracranial hemorrhage, mass, midline  shift, or extra-axial fluid collection. Ventricles and sulci are within normal limits for age. An old left orbital floor fracture is noted. The visualized paranasal sinuses and mastoid air cells are clear. Cerumen is noted in the left greater than right external auditory canals. Calcified atherosclerosis is noted at the skullbase. IMPRESSION: 1. Subacute left cerebellar infarcts as seen on recent MRI. 2. No evidence of new intracranial abnormality. 3. Moderate chronic small vessel ischemic disease. Electronically Signed   By: Logan Bores M.D.   On: 03/07/2016 15:37   Ct Head Wo Contrast  03/02/2016  CLINICAL DATA:  Dizziness EXAM: CT HEAD WITHOUT CONTRAST TECHNIQUE: Contiguous axial images were obtained from the base of the skull through the vertex without intravenous contrast. COMPARISON:  02/27/2012 FINDINGS: Bony calvarium is intact. Scattered areas of decreased attenuation are noted in the deep white matter bilaterally consistent with chronic white matter ischemic change. There is a 2.2 x 02.1 cm rounded hypodensity in the posterior aspect of the left cerebellar hemisphere consistent with an acute infarct. No other focal abnormality is seen. IMPRESSION: Focal infarct in the posterior aspect of the left cerebellum as described. Electronically Signed   By: Inez Catalina M.D.   On: 03/02/2016 20:51   [name] Head Wo Contrast  03/03/2016  CLINICAL DATA:  Initial evaluation for 2-3 days of headache, ataxia EXAM: MRI HEAD WITHOUT CONTRAST MRA HEAD WITHOUT CONTRAST TECHNIQUE: Multiplanar, multiecho pulse sequences of the brain and surrounding structures were obtained without intravenous contrast. Angiographic images of the head were obtained using MRA technique without contrast. COMPARISON:  Prior CT from earlier the same day. FINDINGS: MRI HEAD FINDINGS Diffuse prominence of the CSF containing spaces is compatible with generalized age-related cerebral atrophy. Patchy T2/FLAIR hyperintensity within the  periventricular and deep white matter both cerebral hemispheres most consistent with chronic small vessel ischemic disease, moderate nature. Small vessel type changes present within the pons. Few superimposed lacunar infarcts within the bilateral basal ganglia and white matter of the corona radiata out. Patchy multi focal ischemic infarcts present within the inferior left cerebellar hemisphere, compatible with acute left PICA territory infarcts. No significant mass effect or associated hemorrhage. No other infarct. Gray-white matter differentiation otherwise maintained. Major intracranial vascular flow voids preserved. Left vertebral artery appears to be diminutive. No acute or chronic intracranial hemorrhage. No mass lesion, midline shift, or mass effect. No hydrocephalus. No extra-axial fluid collection. Major dural sinuses are grossly patent. Craniocervical junction within normal limits. Severe degenerative spondylolysis noted at C3-4 with focal kyphosis of the cervical spine and results in mild canal stenosis. Pituitary gland within normal limits. No acute abnormality about the orbits. Probable remote left orbital floor fracture noted. Mild mucosal thickening within the frontoethmoidal recesses bilaterally. Paranasal sinuses otherwise clear. Trace right mastoid opacity without significant effusion. Inner ear structures grossly normal. Bone marrow signal intensity within normal limits. No scalp soft  tissue abnormality. MRA HEAD FINDINGS ANTERIOR CIRCULATION: Visualized portions of the distal cervical ICAs are patent with antegrade flow. Petrous and cavernous segments are widely patent without stenosis. Mild to moderate smooth narrowing of the supraclinoid left ICA. Supraclinoid right ICA widely patent. A1 segments patent. Anterior communicating artery normal. Anterior cerebral arteries well opacified to their distal aspects. M1 segments patent without stenosis or occlusion. MCA bifurcations within normal limits.  Distal MCA branches well opacified and symmetric. POSTERIOR CIRCULATION: Dominant right vertebral artery widely patent to the vertebrobasilar junction. Right posterior inferior cerebral artery well opacified. Left vertebral artery hypoplastic/diminutive, and likely terminates in PICA. The distal left V4 segment/PICA is not well visualized, likely occluded. Basilar artery widely patent. Superior cerebellar arteries well opacified. Both posterior cerebral those arise the basilar artery are well opacified to their distal aspects. IMPRESSION: MRI HEAD IMPRESSION: 1. Patchy multifocal ischemic nonhemorrhagic left PICA territory infarcts. No significant mass effect. 2. Generalized cerebral atrophy with moderate chronic microvascular ischemic disease with superimposed remote lacunar infarcts involving the bilateral basal ganglia/corona radiata. MRA HEAD IMPRESSION: 1. Diminutive left vertebral artery, likely terminating in PICA. Distal left V4 segment/left PICA not visualized, likely occluded. 2. No other large or proximal arterial branch occlusion within the intracranial circulation. No high-grade or correctable stenosis. 3. Mild to moderate smooth narrowing of the supraclinoid left ICA. Electronically Signed   By: Jeannine Boga M.D.   On: 03/03/2016 05:17   Mr Brain Wo Contrast  03/03/2016  CLINICAL DATA:  Initial evaluation for 2-3 days of headache, ataxia EXAM: MRI HEAD WITHOUT CONTRAST MRA HEAD WITHOUT CONTRAST TECHNIQUE: Multiplanar, multiecho pulse sequences of the brain and surrounding structures were obtained without intravenous contrast. Angiographic images of the head were obtained using MRA technique without contrast. COMPARISON:  Prior CT from earlier the same day. FINDINGS: MRI HEAD FINDINGS Diffuse prominence of the CSF containing spaces is compatible with generalized age-related cerebral atrophy. Patchy T2/FLAIR hyperintensity within the periventricular and deep white matter both cerebral  hemispheres most consistent with chronic small vessel ischemic disease, moderate nature. Small vessel type changes present within the pons. Few superimposed lacunar infarcts within the bilateral basal ganglia and white matter of the corona radiata out. Patchy multi focal ischemic infarcts present within the inferior left cerebellar hemisphere, compatible with acute left PICA territory infarcts. No significant mass effect or associated hemorrhage. No other infarct. Gray-white matter differentiation otherwise maintained. Major intracranial vascular flow voids preserved. Left vertebral artery appears to be diminutive. No acute or chronic intracranial hemorrhage. No mass lesion, midline shift, or mass effect. No hydrocephalus. No extra-axial fluid collection. Major dural sinuses are grossly patent. Craniocervical junction within normal limits. Severe degenerative spondylolysis noted at C3-4 with focal kyphosis of the cervical spine and results in mild canal stenosis. Pituitary gland within normal limits. No acute abnormality about the orbits. Probable remote left orbital floor fracture noted. Mild mucosal thickening within the frontoethmoidal recesses bilaterally. Paranasal sinuses otherwise clear. Trace right mastoid opacity without significant effusion. Inner ear structures grossly normal. Bone marrow signal intensity within normal limits. No scalp soft tissue abnormality. MRA HEAD FINDINGS ANTERIOR CIRCULATION: Visualized portions of the distal cervical ICAs are patent with antegrade flow. Petrous and cavernous segments are widely patent without stenosis. Mild to moderate smooth narrowing of the supraclinoid left ICA. Supraclinoid right ICA widely patent. A1 segments patent. Anterior communicating artery normal. Anterior cerebral arteries well opacified to their distal aspects. M1 segments patent without stenosis or occlusion. MCA bifurcations within normal limits. Distal MCA branches  well opacified and symmetric.  POSTERIOR CIRCULATION: Dominant right vertebral artery widely patent to the vertebrobasilar junction. Right posterior inferior cerebral artery well opacified. Left vertebral artery hypoplastic/diminutive, and likely terminates in PICA. The distal left V4 segment/PICA is not well visualized, likely occluded. Basilar artery widely patent. Superior cerebellar arteries well opacified. Both posterior cerebral those arise the basilar artery are well opacified to their distal aspects. IMPRESSION: MRI HEAD IMPRESSION: 1. Patchy multifocal ischemic nonhemorrhagic left PICA territory infarcts. No significant mass effect. 2. Generalized cerebral atrophy with moderate chronic microvascular ischemic disease with superimposed remote lacunar infarcts involving the bilateral basal ganglia/corona radiata. MRA HEAD IMPRESSION: 1. Diminutive left vertebral artery, likely terminating in PICA. Distal left V4 segment/left PICA not visualized, likely occluded. 2. No other large or proximal arterial branch occlusion within the intracranial circulation. No high-grade or correctable stenosis. 3. Mild to moderate smooth narrowing of the supraclinoid left ICA. Electronically Signed   By: Jeannine Boga M.D.   On: 03/03/2016 05:17    Admission HPI: Patient is a 75 year old male with a past medical history of hypertension, stroke, CKD stage III presenting to the hospital today after an episode of "dizziness" today. Patient states he was recently discharged from the hospital (03/05/16) and was feeling fine until today. States when he tried to get up from his chair after lunch today he felt dizzy/lightheaded but did not fall or lose consciousness. Denies having any CP, palpitations, or SOB. States his PO intake has been good and he been eating a lot of fruit lately. Reports taking all of the prescription medicatons he received from the pharmacy including the new ones he was prescribed at discharge. Also reports having an acute onset severe  headache that started at the same time. Patient describes the headache as "thumping" and left sided frontal in location. Denies having any fevers, chills, neck stiffness, nausea, vomiting, seizures, speech difficulty, or focal weakness. He does endorse numbness on the left side of his face at present. States he was experiencing numbness of the fourth and fifth digits of his left hand prior to hospital arrival but the numbness has now resolved. States his headache has also resolved now. No other complaints. Patient was admitted on 03/02/2016 with headache and gait ataxia. He was found to have a left cerebellar stroke with dominant infarct secondary to left PICA occlusion. He was consented for a Stroke AF study and had a loop implanted on 03/04/2016.   Hospital Course by problem list: Active Problems:   Headache   Dizziness   Cerebrovascular accident (CVA) due to occlusion of left posterior cerebral artery ([place])   1. Dizzines Patient presenting with 1 day history of dizziness/lightheadedness and severe headache. Staates his headache resolved on admission. He was also having numbness of the left side of his face. Reports having numbness of the fourth and fifth digits of his left hand which have resolved on admission. He was recently admitted for left cerebellar stroke which was thought to be likely due to atherosclerotic disease. During that hospitalization, head CT demonstrated no bleeding and MRI demonstrated acute multifocal ischemic stroke in the left cerebellum with distal V4 segment of PICA likely occluded. Carotid ultrasound showed no significant stenosis. TTE did not show an apparent cardioembolic source. A1c 5.7. He was started on Plavix for antiplatelet therapy because he had intolerance to aspirin in the past. CT of head this admission had no evidence of new intracranial abnormality. His loop recorder was interrogated and did reveal any atrial fibrillation. Patient was  back at his baseline by the  next day. Perhaps when he restarted his home antihypertensives his peri-infarct perfusion decreased enough to cause his symptoms to return. His home amlodipine, HCTZ, and tamsulosin were held on admission and stopped on discharge. PT evaluated patient and did not have further recommendations. [employer] was called and informed not to place norvasc, HCTZ, or tamsulosin in his new blister packs due April 1st. Mr. Irizarry neighbor was contacted to bring him to the pharmacy to have his BP meds taken out of his current blister pack. He has a clinic appt on 4/3 w/ Dr. Benjamine Mola during which time his BP meds can be slowly resumed if needed.   Discharge Vitals:   BP 119/62 mmHg  Pulse 78  Temp(Src) 98 F (36.7 C) (Oral)  Resp 20  Ht 6\' 2"  (1.88 m)  Wt 194 lb 7.1 oz (88.2 kg)  BMI 24.95 kg/m2  SpO2 98%  Discharge Labs:  Results for orders placed or performed during the hospital encounter of 03/07/16 (from the past 24 hour(s))  Basic metabolic panel     Status: Abnormal   Collection Time: 03/09/16  5:38 AM  Result Value Ref Range   Sodium 140 135 - 145 mmol/L   Potassium 5.4 (H) 3.5 - 5.1 mmol/L   Chloride 109 101 - 111 mmol/L   CO2 22 22 - 32 mmol/L   Glucose, Bld 94 65 - 99 mg/dL   BUN 41 (H) 6 - 20 mg/dL   Creatinine, Ser 1.64 (H) 0.61 - 1.24 mg/dL   Calcium 9.3 8.9 - 10.3 mg/dL   GFR calc non Af Amer 39 (L) >60 mL/min   GFR calc Af Amer 45 (L) >60 mL/min   Anion gap 9 5 - 15    Signed: Norman Herrlich, MD 03/09/2016, 11:21 AM    Services Ordered on Discharge: none Equipment Ordered on Discharge: none

## 2016-03-12 ENCOUNTER — Encounter: Payer: Self-pay | Admitting: *Deleted

## 2016-03-12 ENCOUNTER — Telehealth: Payer: Self-pay | Admitting: *Deleted

## 2016-03-12 ENCOUNTER — Encounter: Payer: Self-pay | Admitting: Internal Medicine

## 2016-03-12 DIAGNOSIS — Z006 Encounter for examination for normal comparison and control in clinical research program: Secondary | ICD-10-CM

## 2016-03-12 NOTE — Progress Notes (Signed)
Telephone call with patient to schedule an EKG and rhythm strip (per Dr. Rayann Heman) for tomorrow. Patient has to schedule transportation 24 hours ahead of time and states he will come to Research office Friday 9 am and will let Research department  know if he has any issues. Research staff aware of unscheduled visit and to inform Dr. Rayann Heman of findings.

## 2016-03-12 NOTE — Telephone Encounter (Signed)
Called Home number provided on hospital information sheet and reached Mr. Eveland neighbor. She said she would check on him and make sure he scheduled transportation to the research office for EKG Friday.

## 2016-03-14 ENCOUNTER — Encounter: Payer: Self-pay | Admitting: Internal Medicine

## 2016-03-14 ENCOUNTER — Encounter: Payer: Self-pay | Admitting: *Deleted

## 2016-03-14 DIAGNOSIS — Z006 Encounter for examination for normal comparison and control in clinical research program: Secondary | ICD-10-CM

## 2016-03-15 DIAGNOSIS — I639 Cerebral infarction, unspecified: Secondary | ICD-10-CM

## 2016-03-15 HISTORY — DX: Cerebral infarction, unspecified: I63.9

## 2016-03-17 ENCOUNTER — Ambulatory Visit: Payer: Medicare Other | Admitting: Internal Medicine

## 2016-03-17 ENCOUNTER — Encounter: Payer: Self-pay | Admitting: Internal Medicine

## 2016-03-19 ENCOUNTER — Telehealth: Payer: Self-pay | Admitting: Internal Medicine

## 2016-03-19 NOTE — Telephone Encounter (Signed)
APPT. REMINDER CALL, LMTCB °

## 2016-03-19 NOTE — Progress Notes (Signed)
STROKE-AF Unscheduled visit due to loop recorder detecting abnormal rhythm. Dr. Rayann Heman requested EKG and Rhythm strip with loop interrogation. Results of EKG reviewed with Amber Seiler,NP. Device parameters slightly reprogrammed as instructed by Dannial Monarch with [employer]. (please see Research source binders for details.)

## 2016-03-20 ENCOUNTER — Encounter: Payer: Self-pay | Admitting: Internal Medicine

## 2016-03-20 ENCOUNTER — Encounter: Payer: Self-pay | Admitting: Licensed Clinical Social Worker

## 2016-03-20 ENCOUNTER — Ambulatory Visit (INDEPENDENT_AMBULATORY_CARE_PROVIDER_SITE_OTHER): Payer: Medicare Other | Admitting: Internal Medicine

## 2016-03-20 VITALS — BP 142/82 | HR 85 | Temp 97.4°F | Ht 74.0 in | Wt 184.3 lb

## 2016-03-20 DIAGNOSIS — Z8673 Personal history of transient ischemic attack (TIA), and cerebral infarction without residual deficits: Secondary | ICD-10-CM | POA: Diagnosis not present

## 2016-03-20 DIAGNOSIS — F17210 Nicotine dependence, cigarettes, uncomplicated: Secondary | ICD-10-CM

## 2016-03-20 DIAGNOSIS — E875 Hyperkalemia: Secondary | ICD-10-CM

## 2016-03-20 DIAGNOSIS — R339 Retention of urine, unspecified: Secondary | ICD-10-CM | POA: Insufficient documentation

## 2016-03-20 DIAGNOSIS — I63532 Cerebral infarction due to unspecified occlusion or stenosis of left posterior cerebral artery: Secondary | ICD-10-CM

## 2016-03-20 DIAGNOSIS — R42 Dizziness and giddiness: Secondary | ICD-10-CM

## 2016-03-20 DIAGNOSIS — I639 Cerebral infarction, unspecified: Secondary | ICD-10-CM

## 2016-03-20 MED ORDER — TAMSULOSIN HCL 0.4 MG PO CAPS: 0.4000 mg | ORAL_CAPSULE | Freq: Every day | ORAL | Status: DC

## 2016-03-20 MED ORDER — FUROSEMIDE 40 MG PO TABS: 20.0000 mg | ORAL_TABLET | Freq: Every day | ORAL | Status: DC

## 2016-03-20 NOTE — Progress Notes (Signed)
[name] was referred to CSW for transportation assistance.  Pt informed PCP that he had received transportation in the past and is requesting assistance as he has upcoming medical appointments.  Upon chart review, [name] was linked with [facility] in early 2016.  Pt has had two recent hospitalizations.  CSW met with [name], confirmed pt has traditional Medicare benefits.  However, pt does not have medicaid.  [name] unable to recall if he has had Medicaid in the past or if he has applied for medicaid in the past.  CSW discussed referral to [facility] for community care management.  Pt agreeable and confirmed address and phone number.  CSW provided [name] with [facility] Medicaid application.  Pt states he has neighbor/friend who would be available to assist with completing application.  CSW provided [name] with self addressed stamped envelope to return to this worker and informed this worker would mail completed application to local DSS if returned to this worker. CSW assisted today with one way [place] fixed route bus pass.  Pt does not use [facility], prefers fixed route bus.  Referral to [facility].

## 2016-03-20 NOTE — Patient Instructions (Signed)
You urinary frequency is due to a combination of diuretics and stopping your treatment for prostate enlargement. The side effect of these medicines can be dizziness which is why it was discontinued initially. We are changing you to a different medicine but will need to see you again within the next few weeks to see if you are tolerating it okay or having more trouble with balance.

## 2016-03-20 NOTE — Progress Notes (Signed)
Subjective:   Patient ID: Derrick Hendrix male   DOB: 1941-09-11 75 y.o.   MRN: [id number]  HPI: Derrick Hendrix is a 75 y.o. man with PMHx as detailed below presenting for follow up after recent hospitalization for posterior circulation stroke. His main symptom at presentation and afterwards has been dizziness. This has not caused any significant falls. He was reevaluated for this at the hospital and had several medications discontinued including terazosin as possible contributors. However he has had worsened urinary hesitancy and frequency after this that wakes him up to 8 times nightly.  See problem based assessment and plan below for additional details.  Past Medical History  Diagnosis Date  . Hypertension   . Headache(784.0)   . Hyperkalemia 08/2014  . Stroke ([place])     hx of PAD  . Chronic kidney disease     HX OF CKD 3   Current Outpatient Prescriptions  Medication Sig Dispense Refill  . atorvastatin (LIPITOR) 40 MG tablet Take 40 mg by mouth daily. For 10 days, ending 03-15-16    . cilostazol (PLETAL) 100 MG tablet     . clopidogrel (PLAVIX) 75 MG tablet Take 1 tablet (75 mg total) by mouth daily. 30 tablet 0  . fludrocortisone (FLORINEF) 0.1 MG tablet Take 1 tablet (0.1 mg total) by mouth daily. 90 tablet 3  . furosemide (LASIX) 40 MG tablet Take 0.5 tablets (20 mg total) by mouth daily. 30 tablet 1  . ipratropium (ATROVENT) 0.06 % nasal spray Place 2 sprays into the nose 3 (three) times daily. (Patient taking differently: Place 2 sprays into the nose daily as needed for rhinitis. ) 15 mL 2  . nitroGLYCERIN (NITROSTAT) 0.4 MG SL tablet Place 1 tablet (0.4 mg total) under the tongue every 5 (five) minutes x 3 doses as needed for chest pain. 30 tablet 3  . polyethylene glycol (MIRALAX) packet Take 17 g by mouth daily. (Patient taking differently: Take 17 g by mouth daily as needed for mild constipation. ) 14 each 0  . tamsulosin (FLOMAX) 0.4 MG CAPS capsule Take 1 capsule (0.4 mg  total) by mouth daily after supper. 30 capsule 2   No current facility-administered medications for this visit.   Family History  Problem Relation Age of Onset  . Hypertension Mother     died age 74  . Diabetes Mother    Social History   Social History  . Marital Status: Single    Spouse Name: N/A  . Number of Children: N/A  . Years of Education: N/A   Social History Main Topics  . Smoking status: Current Every Day Smoker -- 1.00 packs/day for 40 years    Types: Cigarettes  . Smokeless tobacco: Never Used     Comment: but hoping to quit one day  3-4 CIAGARETTES A DAY  . Alcohol Use: No     Comment: Liquor once a week.  . Drug Use: No  . Sexual Activity: Not Asked   Other Topics Concern  . None   Social History Narrative   Works in the yard         Review of Systems: Review of Systems  Constitutional: Negative for malaise/fatigue.  Eyes: Negative for blurred vision.  Respiratory: Negative for shortness of breath.   Cardiovascular: Positive for leg swelling.  Genitourinary: Positive for urgency and frequency. Negative for dysuria and hematuria.  Musculoskeletal: Negative for falls.  Neurological: Positive for dizziness. Negative for headaches.    Objective:  Physical Exam: Filed Vitals:  03/20/16 0923  BP: 142/82  Pulse: 85  Temp: 97.4 F (36.3 C)  TempSrc: Oral  Height: 6\' 2"  (1.88 m)  Weight: 184 lb 4.8 oz (83.598 kg)  SpO2: 100%   GENERAL- alert, co-operative, NAD HEENT- Oral mucosa appears moist, EOMI CARDIAC- RRR, no murmurs, rubs or gallops. RESP- CTAB, no wheezes or crackles. NEURO- No obvious Cr N abnormality, strength upper and lower extremities- 5/5 EXTREMITIES- symmetric, no pedal edema. SKIN- Warm, dry, No rash or lesion. PSYCH- Normal mood and affect, appropriate thought content and speech.  Assessment & Plan:

## 2016-03-21 ENCOUNTER — Encounter: Payer: Self-pay | Admitting: Internal Medicine

## 2016-03-21 ENCOUNTER — Telehealth: Payer: Self-pay | Admitting: *Deleted

## 2016-03-21 NOTE — Telephone Encounter (Signed)
Pt calls, upset because he has not gotten his scripts from the pharmacy, he was told they were not there, called pharm, she states yes they got both scripts but they put both on hold because it was not time to pick up the furosemide since he had gotten that one a few weeks ago, i ask about the tamsulosin specifically and was told 'we just put it on hold too" no other reason, i ask her to call him and explain this and make sure he gets his tamsulosin today, she stated she will do so now

## 2016-03-21 NOTE — Assessment & Plan Note (Signed)
Assessment: Continuing on lasix daily for kaluretic effect. This is likely worsening his urinary frequency to some extent but given his tendency towards hyperkalemia with electrical abnormalities on EKG I think the benefit outweighs this side effect.  Plan: Continue lasix 20mg  daily

## 2016-03-21 NOTE — Assessment & Plan Note (Signed)
Assessment: Frequent dizziness without falls due to his recent L PICA territory infarct. His antihypertensives were exacerbating symptoms afterwards biut have been discontinued. Terazosin likely also contributed to orthostatic dizziness due to systemic alpha blockade. Unfortunately his degree of urinary retention is very severe and if unable to tolerate medical management would probably need surgery such as TURPS to prevent worsening discomfort and possibly obstructive nephropathy.  Plan: -Based on his current level of discomfort I would prefer to start a more immediately acting treatment with tamsulosin today and assess for benefit and dizziness. -If he is tolerating the medication well this can be continued. -If he has some side effects but gets great relief consider continuing the medication while starting a longer duration treatment such as finasteride. -If he sees no benefit to symptoms with treatment consider stopping this and referral to Urology or see if he would tolerate months without intervention until therapeutic effect is reached on finasteride

## 2016-03-24 ENCOUNTER — Other Ambulatory Visit: Payer: Self-pay

## 2016-03-24 DIAGNOSIS — Z748 Other problems related to care provider dependency: Secondary | ICD-10-CM

## 2016-03-24 NOTE — Patient Outreach (Addendum)
[facility]) Care Management  03/24/2016  Derrick Hendrix 1941/01/26 [id number]   SUBJECTIVE; Telephone call to patient regarding EMMI stroke RED referral. HIPAA verified with patient. Discussed EMMI stroke transition program follow up by [facility] with patient. Patient verbally agreed to receive follow up with [facility] care management.  Patient states he is still having some dizziness. Patient states he saw his primary MD on 03/21/16. Patient states his doctor started him on new medication, tamsulosin. Patient states he feels it is helping. Patient states the dizziness is not as bad as it was. Patient states he has been going outside daily to do some walking.  Patient denies using any ambulatory devices. Patient states he does not have any other issues from the stroke other than the dizziness. Patient denies having any home therapy. Patient states he is able to obtain all of his medications.  Patient states he has a follow up appointment with his primary MD on 04/06/16. Patient reports he has follow up appointment with the kidney doctor on 03/31/16 and he is suppose to follow up with the cardiologist. Patient states he has an appointment with the neurologist, Dr Leonie Man in May 2017.   Patient states his sister is helping him to fill out the [facility] application. Patient states his neighbor helps to get him to his doctor appointments sometimes. Patient states he needs help with bus passes. Patient states by the end of the month he has used up a lot of him money in bus passes.  RNCM advised patient to keep follow up doctor appointments RNCM discussed signs/symptoms of stroke with patient. Advised patient to call 911 for stroke like symptoms.  RNCM advised patient to take his medication as prescribed by his doctors.   ASSESSMENT: EMMI stroke transition program.  Per patients medical record referral was made to [facility] care management on 03/20/16 by Golden Hurter, social worker to assist patient with  transportation needs.   PLAN:  RNCM will follow up with patient within 1 week.  RNCM will refer patient to [facility] care management social worker for assistance with transportation.   [name] RN,BSN,CCM [facility] Telephonic  [phone]     6/17

## 2016-03-24 NOTE — Progress Notes (Signed)
Case discussed with Dr. Rice at the time of the visit.  We reviewed the resident's history and exam and pertinent patient test results.  I agree with the assessment, diagnosis, and plan of care documented in the resident's note. 

## 2016-03-24 NOTE — Patient Outreach (Signed)
[facility]) Care Management  03/24/2016  Derrick Hendrix 11-01-1941 [id number]   Patient triggered RED on EMMI Stroke dashboard, notification sent to [name], RN.  Thanks, [name]. [place], [place] Assistant Phone: [phone] Fax: [phone]

## 2016-03-25 NOTE — Addendum Note (Signed)
Addended by: [name] E on: 03/25/2016 11:50 AM   Modules accepted: Orders

## 2016-03-25 NOTE — Progress Notes (Signed)
This encounter was created in error - please disregard.

## 2016-03-26 ENCOUNTER — Ambulatory Visit: Payer: Self-pay

## 2016-03-26 ENCOUNTER — Encounter: Payer: Self-pay | Admitting: Internal Medicine

## 2016-03-27 ENCOUNTER — Other Ambulatory Visit: Payer: Self-pay | Admitting: *Deleted

## 2016-03-27 NOTE — Patient Outreach (Signed)
[facility]) Care Management  03/27/2016  Derrick Hendrix [birth year] [id number]  CSW received a new referral on patient from [name], Telephonic Nurse Case Manager with [facility], indicating that patient would benefit from social work services and resources to assist with obtaining transportation to and from physician appointments.  [name] also requested that [place] assist patient with checking his Adult Medicaid status through the [place], to determine whether or not patient has been approved or denied access. CSW made an initial attempt to try and contact patient today to perform phone assessment, as well as assess and assist with social needs and services, without success.  A HIPAA complaint message was left for patient on voicemail.  CSW is currently awaiting a return call. Nat Christen, BSW, MSW, LCSW  Licensed Education officer, environmental Health System  Mailing [place] [address] [place], [place] [address] Physical Address-[address], [place], [place] [address] Toll Free Main # [phone] Fax # [phone] Cell # [phone]  Fax # [phone]  [email or web address] Patient's preferred language:  [place]   English  ATTENTION:  If you speak [place], language assistance services, free of charge, are available to you.    Nondiscrimination and Accessibility Statement: Discrimination is Against the [employer], a subsidiary of [employer], complies with [employer] civil rights laws and does not discriminate on the basis of race, color, national origin, age, disability, or sex.  [place] does not exclude people or treat them differently because of race, color, national origin, age, disability, or sex.  [place] Providers will:  . Provide free aids and services to people with  disabilities to communicate effectively with [place], such as:     ? Qualified sign language interpreters  ? Written information in other formats (large print, audio, accessible electronic formats, other formats)   . Provide free language services to people whose primary language is not [place], such as:    ? Qualified interpreters    ? Information written in other languages   If you need these services, contact your [facility] Fore[facility] psychologist.  If you believe that a [employer] has failed to provide these services or discriminated in another way on the basis of race, color, national origin, age, disability, or sex, you can file a Tourist information centre manager with: [place], [phone] or [email or web address].info/.  You can file a grievance in person or by mail, fax, or email. If you need help filing a grievance, you may contact Valrie Hart, Interim Compliance Officer, [facility] Department of Compliance and Integrity, [place], [place], [place]. [place], [phone], [email or web address].    You can also file a civil rights complaint with the U.S. Department of Health and Financial controller, Office for [employer], electronically through the Office for Civil Rights Complaint Portal, available at OnSiteLending.nl.jsf, or by mail or phone at:  [place]. Department of Health and Human Services [address], [place] Room [phone], [facility] [address] [place], [place]  [phone], [phone] (TDD) Complaint forms are available at CutFunds.si.

## 2016-04-01 ENCOUNTER — Encounter: Payer: Self-pay | Admitting: *Deleted

## 2016-04-01 ENCOUNTER — Encounter: Payer: Self-pay | Admitting: Nurse Practitioner

## 2016-04-01 ENCOUNTER — Other Ambulatory Visit: Payer: Self-pay | Admitting: Nurse Practitioner

## 2016-04-01 ENCOUNTER — Encounter: Payer: Self-pay | Admitting: Internal Medicine

## 2016-04-01 DIAGNOSIS — Z006 Encounter for examination for normal comparison and control in clinical research program: Secondary | ICD-10-CM

## 2016-04-01 MED ORDER — CARVEDILOL 3.125 MG PO TABS: 3.1250 mg | ORAL_TABLET | Freq: Two times a day (BID) | ORAL | Status: DC

## 2016-04-01 NOTE — Progress Notes (Signed)
Seen in research today for evaluation of LINQ Strips demonstrate sinus rhythm with frequent PAC's EKG today sinus rhythm BP 142/81  Will start low dose Coreg for ectopy and blood pressure management. Rx sent in to [facility] per patient request.  Pt has follow up scheduled with PCP next week and Coreg can be uptitrated then if needed.  Side effects, rationale for Coreg reviewed with patient today. He reports understanding.  Chanetta Marshall, NP 04/01/2016 10:43 AM

## 2016-04-01 NOTE — Progress Notes (Signed)
STROKE-AF Research Study 1 month follow up visit completed with devise interrogated, EKG , B/P 141/82. Reviewed results with Amber (NP) See note. LINQ was reprogrammed again to less sensitive for AF detection. Coreg started per Amber's note. Questions encouraged and answered.

## 2016-04-02 ENCOUNTER — Other Ambulatory Visit: Payer: Self-pay

## 2016-04-02 NOTE — Patient Outreach (Addendum)
[facility]) Care Management  04/02/2016  Danie Magnone [birth year] [id number]   SUBJECTIVE; Telephone call to patient regarding EMMI stroke program follow up. HIPAA verified with patient.   TRANSPORTATION: Patient states he received information from [employer] transportation service.  Patient states he is aware he has to let them know 24 hours in advance to set up transportation to his appointments. Patient states he does not need the [facility] care management social worker any longer for transportation assistance.   MEDICAID:  Patient states he and his sister completed the Medicaid application and mailed it in.  Patient states he is waiting to hear back.  RNCM informed patient that [facility] care management social worker attempted contact with him by phone on 03/27/16.  Informed patient that social worker could assist him with further follow up regarding his Medicaid.  Patient states he does not think he needs any follow up from the social worker at this time.  RNCM informed patient that she would notify social worker that no further social work services were needed at this time. Patient verbally agreed.   BLOOD PRESSURE: Patient states he is not checking his blood pressure at home. Patient states he went to the clinic on yesterday 04/01/16 regarding the "equipment in his chest for check up."  Patient states he was told his blood pressure was a little high.  Patient states the doctor gave him a new prescription for his blood pressure but he was unable to pick it up on yesterday due to not having transportation. Patient states he will get someone to take him as soon as he can. Patient states he thinks the pharmacy closes at [place] contacted [place] and was informed hours of operation on today was 9am to [place] contacted patient to notify him of [facility] hours of operation for today.   DIZZINESS; patient states he continues to have dizziness. Patient states he feels  like he is going to fall. Patient states he has to hold on to things in the house when walking.  Patient states he does not feel that them medication the doctor gave him is helping. Patient states he was concerned about falling so he stopped a few of his medications.  Patient states he wasn't sure it the "recorder" he has in his chest is causing his dizziness. Patient states he had a severe headache "some days" ago. Patient states he tried to contact his doctors office but was unable to get through to get an appointment. Patient states, "my head hurt terrible."  Patient states, "when I took those two pills my head hurt really bad and I was so dizzy."  Patient states, "my head hurt me all weekend."  Patient states, " I stopped taking those two pills."  Patient states he does not know the name of the medications because the pharmacy puts them in a medication pill box for him.  Patient states, " the only pill I know the name of is the lasix I take." Patient states since he stopped taking the two pills the headaches have gone away and he is not as dizzy as he was.    URINARY RETENTION: Patient states he was having problems urinating.  Patient states he was only having drops of urine. Patient reports since he has started the lasix and the "other pill that helps with the urine," he has been urinating much better.  RNCM discussed with patient need to follow up with his primary MD since he  has had the headaches and stopped taking two of his prescribed medications.  Patient verbally agreed. Patient voiced frustration regarding difficulty setting up appointments. RNCM informed patient she would assist his with follow up appointment.   MEDICATIONS: Patient states his pharmacy is [facility]. Patient states the pharmacy puts his medications in a "tray."  Patient states he doesn't know the name of his medications because his pharmacy handles them.   RNCM contacted Tiffany with Dr. Jeralene Peters office to  scheduled follow up appointment for patient. Patients follow up appointment scheduled for Friday April 21,2017 at 2:15pm.  RNCM contacted patient to notify him of appointment. Patient states he will call [employer] transportation to set up service. RMCM called patient to confirm he was able to set up transportation arrangements for Friday's 04/04/16 appointment. Patient states he was on hold for quite a while and was told by the person on phone she would have to call him back to set up transportation arrangement.  RNCM contacted [employer] and spoke with Katharine Look.  Transportation arrangements made for patient for pick up on Friday 04/04/16 between 1:12pm and 1:42pm with return home between 4:15pm and 4:45pm.  Katharine Look requested patient be notified to call [facility] if doctors appointment is running ahead of schedule or behind schedule.  RNCM contacted patient and notified him of his [employer] transportation arrangements. Notified patient of pick up times for Friday 04/04/16 between 1:12p, and 1:42Pm and return home time between 4:15pm to 4:45pm.  Patient informed to notify [employer] if appointment is running ahead of schedule that he could be picked up early or if appointment is running behind schedule and he needed a later pick up. Patient verbalized understanding and appreciation for assisting with process.   RNCM discussed and offered patient [facility] care management community nurse follow up. Patient refused. Patient states he doesn't mind someone calling him. Patient verbally agreed to next follow up call with RNCM.   ASSESSMENT: EMMI stroke transition program.  Per hospital discharge summary of patients electronic MEDICAL RECORD NUMBER 75 year old man with a recent left cerebellar ischemic CVA was readmitted to our service because of return of his CVA related symptoms including dizziness and gait instability.  Patients inpatient hospital course was 03/07/16 to 03/09/16.  PLAN:  RNCM will follow up with patient within 1 week.   [name] RN,BSN,CCM [facility] Telephonic  [phone]

## 2016-04-03 ENCOUNTER — Ambulatory Visit (INDEPENDENT_AMBULATORY_CARE_PROVIDER_SITE_OTHER): Payer: Medicare Other | Admitting: *Deleted

## 2016-04-03 ENCOUNTER — Telehealth: Payer: Self-pay | Admitting: Internal Medicine

## 2016-04-03 DIAGNOSIS — I639 Cerebral infarction, unspecified: Secondary | ICD-10-CM

## 2016-04-03 NOTE — Telephone Encounter (Signed)
APPT. REMINDER CALL, LMTCB °

## 2016-04-04 ENCOUNTER — Ambulatory Visit (INDEPENDENT_AMBULATORY_CARE_PROVIDER_SITE_OTHER): Payer: Medicare Other | Admitting: Internal Medicine

## 2016-04-04 ENCOUNTER — Encounter: Payer: Self-pay | Admitting: Internal Medicine

## 2016-04-04 ENCOUNTER — Other Ambulatory Visit: Payer: Self-pay | Admitting: *Deleted

## 2016-04-04 ENCOUNTER — Encounter: Payer: Self-pay | Admitting: Licensed Clinical Social Worker

## 2016-04-04 ENCOUNTER — Observation Stay (HOSPITAL_COMMUNITY)
Admission: AD | Admit: 2016-04-04 | Discharge: 2016-04-05 | Disposition: A | Discharge status: Home / Self Care | Payer: Medicare Other | Source: Ambulatory Visit | Attending: Internal Medicine | Admitting: Internal Medicine

## 2016-04-04 ENCOUNTER — Encounter (HOSPITAL_COMMUNITY): Payer: Self-pay | Admitting: *Deleted

## 2016-04-04 ENCOUNTER — Encounter: Payer: Self-pay | Admitting: *Deleted

## 2016-04-04 VITALS — BP 147/80 | HR 84 | Temp 97.7°F

## 2016-04-04 DIAGNOSIS — N401 Enlarged prostate with lower urinary tract symptoms: Secondary | ICD-10-CM | POA: Insufficient documentation

## 2016-04-04 DIAGNOSIS — F1090 Alcohol use, unspecified, uncomplicated: Secondary | ICD-10-CM | POA: Diagnosis present

## 2016-04-04 DIAGNOSIS — Z8673 Personal history of transient ischemic attack (TIA), and cerebral infarction without residual deficits: Secondary | ICD-10-CM

## 2016-04-04 DIAGNOSIS — F17210 Nicotine dependence, cigarettes, uncomplicated: Secondary | ICD-10-CM | POA: Insufficient documentation

## 2016-04-04 DIAGNOSIS — N138 Other obstructive and reflux uropathy: Secondary | ICD-10-CM | POA: Insufficient documentation

## 2016-04-04 DIAGNOSIS — Z79899 Other long term (current) drug therapy: Secondary | ICD-10-CM | POA: Diagnosis not present

## 2016-04-04 DIAGNOSIS — R339 Retention of urine, unspecified: Secondary | ICD-10-CM | POA: Diagnosis not present

## 2016-04-04 DIAGNOSIS — I951 Orthostatic hypotension: Secondary | ICD-10-CM | POA: Diagnosis not present

## 2016-04-04 DIAGNOSIS — Z7289 Other problems related to lifestyle: Secondary | ICD-10-CM | POA: Diagnosis present

## 2016-04-04 DIAGNOSIS — R42 Dizziness and giddiness: Secondary | ICD-10-CM | POA: Diagnosis present

## 2016-04-04 DIAGNOSIS — N183 Chronic kidney disease, stage 3 (moderate): Secondary | ICD-10-CM | POA: Diagnosis not present

## 2016-04-04 DIAGNOSIS — E2749 Other adrenocortical insufficiency: Secondary | ICD-10-CM | POA: Diagnosis present

## 2016-04-04 DIAGNOSIS — F17200 Nicotine dependence, unspecified, uncomplicated: Secondary | ICD-10-CM | POA: Diagnosis present

## 2016-04-04 DIAGNOSIS — N185 Chronic kidney disease, stage 5: Secondary | ICD-10-CM | POA: Diagnosis present

## 2016-04-04 DIAGNOSIS — Z7902 Long term (current) use of antithrombotics/antiplatelets: Secondary | ICD-10-CM | POA: Insufficient documentation

## 2016-04-04 DIAGNOSIS — E875 Hyperkalemia: Secondary | ICD-10-CM | POA: Diagnosis present

## 2016-04-04 DIAGNOSIS — N184 Chronic kidney disease, stage 4 (severe): Secondary | ICD-10-CM | POA: Diagnosis present

## 2016-04-04 DIAGNOSIS — Z789 Other specified health status: Secondary | ICD-10-CM | POA: Diagnosis present

## 2016-04-04 LAB — CBC
HEMATOCRIT: 42.2 % (ref 39.0–52.0)
Hemoglobin: 13.5 g/dL (ref 13.0–17.0)
MCH: 29.2 pg (ref 26.0–34.0)
MCHC: 32 g/dL (ref 30.0–36.0)
MCV: 91.1 fL (ref 78.0–100.0)
PLATELETS: 240 10*3/uL (ref 150–400)
RBC: 4.63 MIL/uL (ref 4.22–5.81)
RDW: 15 % (ref 11.5–15.5)
WBC: 6 10*3/uL (ref 4.0–10.5)

## 2016-04-04 LAB — BASIC METABOLIC PANEL
Anion gap: 14 (ref 5–15)
BUN: 44 mg/dL — AB (ref 6–20)
CHLORIDE: 102 mmol/L (ref 101–111)
CO2: 17 mmol/L — AB (ref 22–32)
CREATININE: 1.93 mg/dL — AB (ref 0.61–1.24)
Calcium: 9.4 mg/dL (ref 8.9–10.3)
GFR calc Af Amer: 37 mL/min — ABNORMAL LOW (ref >60)
GFR calc non Af Amer: 32 mL/min — ABNORMAL LOW (ref >60)
Glucose, Bld: 88 mg/dL (ref 65–99)
POTASSIUM: 6.1 mmol/L — AB (ref 3.5–5.1)
SODIUM: 133 mmol/L — AB (ref 135–145)

## 2016-04-04 LAB — GLUCOSE, CAPILLARY
Glucose-Capillary: 179 mg/dL — ABNORMAL HIGH (ref 65–99)
Glucose-Capillary: 140 mg/dL — ABNORMAL HIGH (ref 65–99)

## 2016-04-04 MED ORDER — ATORVASTATIN CALCIUM 40 MG PO TABS: 40.0000 mg | ORAL_TABLET | Freq: Every day | ORAL | Status: DC

## 2016-04-04 MED ORDER — ACETAMINOPHEN 650 MG RE SUPP: 650.0000 mg | Freq: Four times a day (QID) | RECTAL | Status: DC | PRN

## 2016-04-04 MED ORDER — CLOPIDOGREL BISULFATE 75 MG PO TABS
75.0000 mg | ORAL_TABLET | Freq: Every day | ORAL | Status: DC
  Administered 2016-04-05: 75 mg via ORAL
  Filled 2016-04-04: qty 1

## 2016-04-04 MED ORDER — ENOXAPARIN SODIUM 40 MG/0.4ML ~~LOC~~ SOLN
40.0000 mg | SUBCUTANEOUS | Status: DC
  Filled 2016-04-04: qty 0.4

## 2016-04-04 MED ORDER — CILOSTAZOL 100 MG PO TABS: 100.0000 mg | ORAL_TABLET | Freq: Two times a day (BID) | ORAL | Status: DC

## 2016-04-04 MED ORDER — ACETAMINOPHEN 325 MG PO TABS
650.0000 mg | ORAL_TABLET | Freq: Four times a day (QID) | ORAL | Status: DC | PRN
  Administered 2016-04-04: 650 mg via ORAL
  Filled 2016-04-04: qty 2

## 2016-04-04 MED ORDER — INSULIN ASPART 100 UNIT/ML ~~LOC~~ SOLN
10.0000 [IU] | Freq: Once | SUBCUTANEOUS | Status: AC
  Administered 2016-04-04: 10 [IU] via SUBCUTANEOUS

## 2016-04-04 MED ORDER — ATORVASTATIN CALCIUM 80 MG PO TABS
80.0000 mg | ORAL_TABLET | Freq: Every day | ORAL | Status: DC
  Administered 2016-04-05: 80 mg via ORAL
  Filled 2016-04-04: qty 1

## 2016-04-04 MED ORDER — POLYETHYLENE GLYCOL 3350 17 G PO PACK: 17.0000 g | PACK | Freq: Every day | ORAL | Status: DC | PRN

## 2016-04-04 MED ORDER — FINASTERIDE 5 MG PO TABS
5.0000 mg | ORAL_TABLET | Freq: Every day | ORAL | Status: DC
  Administered 2016-04-05: 5 mg via ORAL
  Filled 2016-04-04: qty 1

## 2016-04-04 MED ORDER — FLUDROCORTISONE ACETATE 0.1 MG PO TABS
0.1000 mg | ORAL_TABLET | Freq: Every day | ORAL | Status: DC
  Administered 2016-04-05: 0.1 mg via ORAL
  Filled 2016-04-04: qty 1

## 2016-04-04 MED ORDER — SODIUM POLYSTYRENE SULFONATE 15 GM/60ML PO SUSP
30.0000 g | Freq: Once | ORAL | Status: AC
  Administered 2016-04-04: 30 g via ORAL
  Filled 2016-04-04: qty 120

## 2016-04-04 MED ORDER — DEXTROSE 50 % IV SOLN
50.0000 mL | Freq: Once | INTRAVENOUS | Status: AC
  Administered 2016-04-04: 50 mL via INTRAVENOUS
  Filled 2016-04-04: qty 50

## 2016-04-04 MED ORDER — ENOXAPARIN SODIUM 30 MG/0.3ML ~~LOC~~ SOLN
30.0000 mg | SUBCUTANEOUS | Status: DC
  Administered 2016-04-04: 30 mg via SUBCUTANEOUS
  Filled 2016-04-04: qty 0.3

## 2016-04-04 MED ORDER — SODIUM CHLORIDE 0.9 % IV SOLN
INTRAVENOUS | Status: DC
  Administered 2016-04-04 – 2016-04-05 (×3): via INTRAVENOUS

## 2016-04-04 NOTE — Progress Notes (Signed)
Report called to [facility] on 6E. Pt transported via w/c.

## 2016-04-04 NOTE — Progress Notes (Signed)
Carelink Summary Report / Loop Recorder 

## 2016-04-04 NOTE — H&P (Signed)
Date: 04/04/2016               Patient Name:  Derrick Hendrix MRN: [id number]  DOB: 1941/03/28 Age / Sex: 75 y.o., male   PCP: [name], MD         Medical Service: Internal Medicine Teaching Service         Attending Physician: Dr. Oval Linsey, MD    First Contact: Dr. Melburn Hake Pager: [phone]  Second Contact: Dr. Posey Pronto Pager: [phone]       After Hours (After 5p/  First Contact Pager: [phone]  weekends / holidays): Second Contact Pager: [phone]   Chief Complaint: Headache, Dizziness  History of Present Illness: Mr. Swindell is a 75 yo male with hyporeninemic hypoaldosteronism, ischemic stroke to the left PICA in March 2017,50 pack-year smoking history, chronic kidney disease stage 3, and benign prostatic hypertrophy who was admitted from the [facility] clinic today due to recurrent headaches, vertigo, and orthostatic hypotension.  Since his stroke last month, he has had vertigo that is particularly worse when lying flat. Two weeks ago, he started tamsulosin for benign prostatic hypertrophy. Since then, he has felt progressively lightheaded upon standing. Additionally, 3 days ago, he began having a left-sided headache that is constant, not associated with nausea, and not worse with certain head movements. He has not tried anything to help alleviate the pain. For the last 3 days, he has not taken his fludrocortisone nor clopidogrel because he thought these may be making him dizzy. He denies new unilateral weakness or numbness, falling to one side, vision changes, fever, chest pain; review of systems was otherwise non-revealing.  Medications: Atorvastatin 30m daily Clopidogrel 756mdaily Fludrocortisone 0.17m79maily Furosemide 73m95mily Terazosin 17mg 617mly  Allergies: Aspirin causes nosebleeds   Past medical history: Hyporeninemic hypoaldosteronism with hyperkalemia  Family history: Mother had hypertension and diabetes Unsure of father  Social history: [place] independent of all ADLs He smokes 0.5 packs of cigarettes daily for the last 50 years No alcohol or illicit drugs  Review of Systems: General: Denies fever, chills, fatigue, change in appetite and diaphoresis.  Respiratory: Denies SOB, cough, DOE, chest tightness, and wheezing.   Cardiovascular: Denies chest pain and palpitations.  Gastrointestinal: Denies nausea, vomiting, abdominal pain, diarrhea, constipation, blood in stool and abdominal distention.  Genitourinary: Denies dysuria, urgency, frequency, hematuria, suprapubic pain and flank pain. Endocrine: Denies hot or cold intolerance, polyuria, and polydipsia. Musculoskeletal: Denies myalgias, back pain, joint swelling, arthralgias and gait problem.  Skin: Denies pallor, rash and wounds.  Neurological: Denies dizziness, headaches, weakness, lightheadedness, numbness,seizures, and syncope, Psychiatric/Behavioral: Denies mood changes, confusion, nervousness, sleep disturbance and agitation.  Physical Exam: Filed Vitals:   04/04/16 1638 04/04/16 1800  Height:  _0  (1.88 m)  SpO2: 100%    General: resting in bed comfortably, appropriately conversational HEENT: no scleral icterus, extra-ocular muscles intact, oropharynx without lesions Cardiac: regular rate and rhythm, no rubs, murmurs or gallops Pulm: breathing well, clear to auscultation bilaterally Abd: bowel sounds normal, soft, nondistended, non-tender Ext: warm and well perfused, without pedal edema Lymph: no cervical or supraclavicular lymphadenopathy Skin: no rash, hair, or nail changes Neuro: alert and oriented X3, cranial nerves II-XII grossly intact, no nystagmus, no dysmetria or dysdiadokinesia, strength 5/5 throughout, sensation intact to light touch throughout  Lab results: Basic Metabolic Panel:  Recent Labs  04/04/16 1717  NA 133*  K 6.1*  CL 102  CO2 17*  GLUCOSE 88  BUN  44*  CREATININE 1.93*  CALCIUM 9.4   CBC:  Recent Labs   04/04/16 1717  WBC 6.0  HGB 13.5  HCT 42.2  MCV 91.1  PLT 240   Urine Drug Screen: Drugs of Abuse     Component Value Date/Time   LABOPIA NONE DETECTED 06/26/2014 1552   COCAINSCRNUR NONE DETECTED 06/26/2014 1552   LABBENZ NONE DETECTED 06/26/2014 1552   AMPHETMU NONE DETECTED 06/26/2014 1552   THCU NONE DETECTED 06/26/2014 1552   LABBARB NONE DETECTED 06/26/2014 1552     Assessment & Plan by Problem:  Mr. Garrabrant is a 75 year old man with hyporeninemic hypoaldosteronism, ischemic stroke to the left PICA in March 2017, 50 pack-year smoking history, chronic kidney disease stage 3, and benign prostatic hypertrophy who was admitted from the [facility] clinic today due to recurrent headaches, orthostatic hypotension, and hyperkalemia.  Unilateral headache: He describes a unilateral headache behind his left eye. He denies vision changes, jaw claudication, shoulder stiffness, or temporal artery tenderness, but given his recent stroke, I think giant cell arteritis is possible so we will attempt to rule it out with an ESR. His neurologic exam is normal, so I doubt hemorrhagic conversion from his ischemic PICA stroke one month ago. We'll try conservative management first; if this does not control his headache, we will re-scan his head to rule out bleed. -Ordered ESR -Will try acetaminophen 1g now -Will need to avoid NSAIDs to avoid hyperkalemia -If his headache remains uncontrolled tonight, will order non-contrast head CT to rule out bleed  Hyperkalemia: Potassium 6.1; this is a chronic issue and is likely from hypoaldosteronism and he had not been taking his fludrocortisone which likely worsened it. He is on furosemide 37m daily. Patiromer, a recently-approved drug that binds potassium in the gut, would be a good substitute to avoid the diuretic effect. Increasing his fludrocortisone should help his potassium as well. -Ordered 10U insulin and 1 amp D50  -Ordered kayexalate -Re-check BMP at  midnight -Re-started fludrocortisone 0.14mdaily -Can consider patiromer upon discharge  Orthostatic hypotension: This is likely from his hypoaldosteronism that was worsened by adding tamsulosin 3 weeks ago. He seems to need the tamsulosin for his prostate; we will hold it now, but resume it upon discharge, in addition to finasteride that may take 6 months to work. To prevent orthostatic hypotension, we will increase his fludrocortisone to 0.46m40maily. He seems to need the furosemide for his hyperkalemiaHe will need close outpatient-follow up and possible urology referral. -Re-hydrating with NS at 150cc/hr -Continue fludrocortisone 0.1mg14mily; will increase to 0.46mg 346mly upon discharge -Holding tamsulosin for now; will continue upon discharge -Holding furosemide 20mg 53my; will continue upon discharge but may switch to patiromer -Started finasteride 5mg da50m  Vertigo related to left PICA stroke: This has not changed since his stroke 1 month ago so I doubt recurrence. -Continue clopidogrel 75mg da57m-Will refer to vestibular rehabilitation upon discharge -Changed atorvastatin 40mg to 13m dail59mven history of stroke  Benign prostatic hypertrophy: Per above; this is a serious issue for him but needs to be balanced to prevent orthostatic hypotension. -Per above  Dispo: Disposition is deferred at this time, awaiting improvement of current medical problems. Anticipated discharge in approximately 1 day(s).   The patient does have a current PCP (ChristophCollier [facility].  The patient does not have transportation limitations that hinder transportation to clinic appointments.  Signed: Zabella Wease FloreLoleta [phone]

## 2016-04-04 NOTE — Progress Notes (Signed)
04/04/2016 5:09 PM  Pt admitted to 6E30 from internal medicine clinic for dizziness and left frontal/periorbital pain.  Pt fully alert and oriented, no focal deficits noted.  Pt standby assist, states he is independent at home with a walking stick.  Pt lives at home alone but does have some family and friend support if needed.  Skin is overall very dry but fully intact-assessed with Fredrich Romans, RN.  Vitals stable.  Pt placed on telemetry box 30 per order, confirmed with CCMD.  Patient denies any chest pain or SOB.  Pt does endorse left frontal lobe pain 9/10 throbbing in nature.  Pharmacy currently at bedside for med reconciliation.  Pt placed on a bed alarm, lowest position.  Pt oriented to room/unit, and was instructed on how to utilize the call bell, to which he verbalized understanding.  Internal medicine resident on call notified of patient's arrival to the floor.  Will continue to monitor. [name]

## 2016-04-04 NOTE — Progress Notes (Signed)
Subjective:   Patient ID: Derrick Hendrix male   DOB: [birth year] 75 y.o.   MRN: [id number]  HPI: Derrick Hendrix is a 75 y.o. with past medical history as outlined below who presents to clinic for an acute visit for dizziness. Pt states on Sunday, 5 days ago he took his morning meds and became dizzy. On Monday he took his morning medications and again became dizzy. He then stopped taking his medications in his pill box that were confirmed from his pharmacy to be florinef and plavix. He also has an associated HA w/ dizziness. He has been eating and tolerating fluids. Denies diarrhea or recent illness.   Please see problem list for status of the pt's chronic medical problems.  Past Medical History  Diagnosis Date  . Hypertension   . Headache(784.0)   . Hyperkalemia 08/2014  . Stroke ([place])     hx of PAD  . Chronic kidney disease     HX OF CKD 3   Current Outpatient Prescriptions  Medication Sig Dispense Refill  . atorvastatin (LIPITOR) 40 MG tablet Take 40 mg by mouth daily. For 10 days, ending 03-15-16    . carvedilol (COREG) 3.125 MG tablet Take 1 tablet (3.125 mg total) by mouth 2 (two) times daily with a meal. 60 tablet 3  . cilostazol (PLETAL) 100 MG tablet     . clopidogrel (PLAVIX) 75 MG tablet Take 1 tablet (75 mg total) by mouth daily. 30 tablet 0  . fludrocortisone (FLORINEF) 0.1 MG tablet Take 1 tablet (0.1 mg total) by mouth daily. 90 tablet 3  . furosemide (LASIX) 40 MG tablet Take 0.5 tablets (20 mg total) by mouth daily. 30 tablet 1  . ipratropium (ATROVENT) 0.06 % nasal spray Place 2 sprays into the nose 3 (three) times daily. (Patient taking differently: Place 2 sprays into the nose daily as needed for rhinitis. ) 15 mL 2  . nitroGLYCERIN (NITROSTAT) 0.4 MG SL tablet Place 1 tablet (0.4 mg total) under the tongue every 5 (five) minutes x 3 doses as needed for chest pain. 30 tablet 3  . polyethylene glycol (MIRALAX) packet Take 17 g by mouth daily. (Patient not taking:  Reported on 03/24/2016) 14 each 0  . tamsulosin (FLOMAX) 0.4 MG CAPS capsule Take 1 capsule (0.4 mg total) by mouth daily after supper. 30 capsule 2   No current facility-administered medications for this visit.   Family History  Problem Relation Age of Onset  . Hypertension Mother     died age 66  . Diabetes Mother    Social History   Social History  . Marital Status: Single    Spouse Name: N/A  . Number of Children: N/A  . Years of Education: N/A   Social History Main Topics  . Smoking status: Current Every Day Smoker -- 0.50 packs/day for 40 years    Types: Cigarettes  . Smokeless tobacco: Never Used     Comment: cutting back.  . Alcohol Use: No     Comment: Liquor once a week.  . Drug Use: No  . Sexual Activity: Not Asked   Other Topics Concern  . None   Social History Narrative   Works in the yard         Review of Systems: Review of Systems  Constitutional: Negative for malaise/fatigue.  Eyes: Negative for blurred vision.  Respiratory: Negative for shortness of breath.   Cardiovascular: Negative for chest pain and palpitations.  Gastrointestinal: Negative for nausea, vomiting and diarrhea.  Neurological: Positive for dizziness and headaches. Negative for focal weakness and weakness.    Objective:  Physical Exam: Filed Vitals:   04/04/16 1445  BP: 147/80  Pulse: 84  Temp: 97.7 F (36.5 C)  TempSrc: Oral  SpO2: 100%   Physical Exam  Constitutional: He appears well-developed and well-nourished.  HENT:  Head: Normocephalic and atraumatic.  Nose: Nose normal.  Eyes: Conjunctivae and EOM are normal. Pupils are equal, round, and reactive to light. Right eye exhibits no discharge. Left eye exhibits no discharge. No scleral icterus.  Cardiovascular: Normal rate, regular rhythm and normal heart sounds.  Exam reveals no gallop and no friction rub.   No murmur heard. Pulmonary/Chest: Effort normal and breath sounds normal. No respiratory distress. He has no  wheezes. He has no rales.  Abdominal: Soft. Bowel sounds are normal. He exhibits no distension and no mass. There is no tenderness. There is no rebound and no guarding.  Neurological: No cranial nerve deficit.  5/5 strength in all 4 extremities, able to do finger to nose b/l. While handing patient his AVS sheet when patient was sitting down he developed acute onset dizziness and had to grab on to his chair and a table all while he was sitting down.    Skin: Skin is warm and dry. No rash noted. He is not diaphoretic. No erythema. No pallor.   Assessment & Plan:   Please see problem based assessment and plan.

## 2016-04-04 NOTE — Patient Outreach (Signed)
[facility]) Care Management  04/04/2016  Derrick Hendrix [birth year] [id number]  CSW was able to make initial contact with patient today to perform phone assessment, as well as assess and assist with social work needs and services.  CSW introduced self, explained role and types of services provided through [place] [facility] ([place] [facility]).  CSW further explained to patient that CSW works with patient's RNCM, also with [place] Management, [name]. CSW then explained the reason for the call, indicating that [name] thought that patient would benefit from social work services and resources to assist with arranging transportation to and from physician appointments.  CSW obtained two HIPAA compliant identifiers from patient, which included patient's name and date of birth. Patient admitted that he is able to frequently converse with Golden Hurter, Licensed Clinical Social Worker with the [facility], to arrange transportation to and from his physician appointments.  Patient is also being followed by [name], Telephonic Nurse Case Manager, for diease management services, as patient is an [place] Stroke referral.  Mrs. Jeralyn Ruths has been able to assist patient with providing bus passes, in addition to assisting patient with completion of an Adult Medicaid application through the [place].  CSW will mail additional bus passes to patient's home to assist with transportation while patient awaits Medicaid approval.  Patient is aware that once he is approved for Adult Medicaid, he will also be eligible to receive Medicaid Transportation and Medicaid Taxi. After thorough review of patient's EMR (Electronic Medical Record) in [employer], CSW noted that patient was able to submit his completed and signed Adult Medicaid application to Mrs. Jeralyn Ruths, who agreed to submit patient's application to the [place] for processing and approval.  CSW left a HIPAA compliant voicemail message with an Adult Medicaid representative, encouraging them to ensure that patient is eligible for [employer] and [employer], when processing patient's application, as transportation is a huge barrier for patient in being compliant with his medical conditions.  Patient denies any additional social work needs at present. CSW will perform a case closure on patient, as all goals of treatment have been met from social work standpoint and no additional social work needs have been identified at this time. CSW will notify patient's RNCM with [place] Management, [name] of CSW's plans to close patient's case. CSW will fax a correspondence letter to patient's Primary Care Physician, Dr. Larey Dresser to ensure that Dr. Lynnae January is aware of CSW's case closure plans.   CSW will submit a case closure request to Lurline Del, Care Management Assistant with [place] [facility], in the form of an [employer].  CSW will ensure that Mrs. Laurance Flatten is aware of Sherril Cong, RNCM with [place] [facility], continued involvement with patient's care.  Nat Christen, BSW, MSW, LCSW  Licensed Education officer, environmental Health System  Mailing [place] [address] [place], [place] [address] Physical Address-[address], [place], [place] [address] Toll Free Main # [phone] Fax # [phone] Cell # [phone]  Fax # [phone]  [email or web address] Patient's preferred language:  [place]   English  ATTENTION:  If you speak [place], language assistance services, free of charge, are available to you.    Nondiscrimination and Accessibility Statement: Discrimination is Against the [employer], a subsidiary of [employer], complies with applicable [employer]  civil rights laws and does not discriminate on the basis of race, color, national origin, age, disability, or sex.  [place] does not exclude people or treat them differently because of race, color, national origin, age, disability, or sex.  [place] Providers will:  . Provide free aids and services to people with disabilities to communicate effectively with [place], such as:     ? Qualified sign language interpreters  ? Written information in other formats (large print, audio, accessible electronic formats, other formats)   . Provide free language services to people whose primary language is not [place], such as:    ? Qualified interpreters    ? Information written in other languages   If you need these services, contact your [facility] psychologist.  If you believe that a [facility] [employer] has failed to provide these services or discriminated in another way on the basis of race, color, national origin, age, disability, or sex, you can file a Tourist information centre manager with: [place], [phone] or [email or web address].[email or web address]  You can file a grievance in person or by mail, fax, or email. If you need help filing a grievance, you may contact Valrie Hart, Interim Compliance Officer, [facility] Department of Compliance and Integrity, [place], [place], [place]. [place], [phone], [email or web address].    You can also file a civil rights complaint with the U.S. Department of Health and Financial controller, Office for [employer], electronically through the Office for Civil Rights Complaint Portal, available at OnSiteLending.nl.jsf, or by mail or phone at:  [place]. Department of Health and Human Services [address] [place] Room [phone], [facility] Building [place], [place]  [phone], [phone] (TDD) Complaint forms are  available at CutFunds.[email or web address].

## 2016-04-04 NOTE — Progress Notes (Signed)
Patient ID: Derrick Hendrix, male   DOB: 08-Mar-1941, 75 y.o.   MRN: [id number] CSW received [name] signed [facility] Application for Health coverage.  This worker placed in envelope and mailed to [facility].

## 2016-04-05 DIAGNOSIS — N138 Other obstructive and reflux uropathy: Secondary | ICD-10-CM | POA: Insufficient documentation

## 2016-04-05 DIAGNOSIS — N400 Enlarged prostate without lower urinary tract symptoms: Secondary | ICD-10-CM | POA: Diagnosis not present

## 2016-04-05 DIAGNOSIS — E2749 Other adrenocortical insufficiency: Secondary | ICD-10-CM | POA: Diagnosis not present

## 2016-04-05 DIAGNOSIS — F17210 Nicotine dependence, cigarettes, uncomplicated: Secondary | ICD-10-CM | POA: Diagnosis not present

## 2016-04-05 DIAGNOSIS — N401 Enlarged prostate with lower urinary tract symptoms: Secondary | ICD-10-CM

## 2016-04-05 DIAGNOSIS — R51 Headache: Secondary | ICD-10-CM | POA: Diagnosis not present

## 2016-04-05 DIAGNOSIS — I951 Orthostatic hypotension: Secondary | ICD-10-CM | POA: Diagnosis not present

## 2016-04-05 DIAGNOSIS — I69398 Other sequelae of cerebral infarction: Secondary | ICD-10-CM | POA: Diagnosis not present

## 2016-04-05 DIAGNOSIS — R42 Dizziness and giddiness: Secondary | ICD-10-CM

## 2016-04-05 DIAGNOSIS — R339 Retention of urine, unspecified: Secondary | ICD-10-CM | POA: Diagnosis not present

## 2016-04-05 DIAGNOSIS — N183 Chronic kidney disease, stage 3 (moderate): Secondary | ICD-10-CM | POA: Diagnosis not present

## 2016-04-05 LAB — BASIC METABOLIC PANEL
Anion gap: 11 (ref 5–15)
BUN: 51 mg/dL — AB (ref 6–20)
CO2: 18 mmol/L — ABNORMAL LOW (ref 22–32)
CREATININE: 1.93 mg/dL — AB (ref 0.61–1.24)
Calcium: 8.6 mg/dL — ABNORMAL LOW (ref 8.9–10.3)
Chloride: 108 mmol/L (ref 101–111)
GFR calc Af Amer: 37 mL/min — ABNORMAL LOW (ref >60)
GFR, EST NON AFRICAN AMERICAN: 32 mL/min — AB (ref >60)
GLUCOSE: 113 mg/dL — AB (ref 65–99)
Potassium: 4.9 mmol/L (ref 3.5–5.1)
SODIUM: 137 mmol/L (ref 135–145)

## 2016-04-05 LAB — CK: CK TOTAL: 103 U/L (ref 49–397)

## 2016-04-05 LAB — SEDIMENTATION RATE: SED RATE: 34 mm/h — AB (ref 0–16)

## 2016-04-05 MED ORDER — ACETAMINOPHEN 500 MG PO TABS: 1000.0000 mg | ORAL_TABLET | Freq: Four times a day (QID) | ORAL | Status: DC | PRN

## 2016-04-05 MED ORDER — TAMSULOSIN HCL 0.4 MG PO CAPS: 0.4000 mg | ORAL_CAPSULE | Freq: Every day | ORAL | Status: DC

## 2016-04-05 MED ORDER — FLUDROCORTISONE ACETATE 0.1 MG PO TABS: ORAL_TABLET | ORAL | Status: DC

## 2016-04-05 MED ORDER — ATORVASTATIN CALCIUM 80 MG PO TABS: 80.0000 mg | ORAL_TABLET | Freq: Every day | ORAL | Status: DC

## 2016-04-05 MED ORDER — FINASTERIDE 5 MG PO TABS: 5.0000 mg | ORAL_TABLET | Freq: Every day | ORAL | Status: DC

## 2016-04-05 MED ORDER — POLYETHYLENE GLYCOL 3350 17 G PO PACK: 17.0000 g | PACK | Freq: Every day | ORAL | Status: DC | PRN

## 2016-04-05 NOTE — H&P (Signed)
Internal Medicine Attending Admission Note Date: 04/05/2016  Patient name: Latoya Manjarres Medical record number: [id number] Date of birth: [birth year] Age: 75 y.o. Gender: male  I saw and evaluated the patient. I reviewed the resident's note and I agree with the resident's findings and plan as documented in the resident's note.  Chief Complaint(s): Dizziness 1 month since CVA and headache 3 days.  History - key components related to admission:  Mr. Marinoff is a 75 year old man status post a left PICA ischemic stroke last month with resultant central vertigo, hyporeninemic hypoaldosteronism complicated by hyperkalemia and orthostatic hypotension, benign prosthetic hypertrophy with bladder outlet obstruction symptoms, stage III chronic kidney disease, and tobacco abuse who is admitted from the clinic with worsening dizziness 2 weeks and a left sided headache 3 days. The dizziness began to worsen after starting tamsulosin for his benign prostatic hypertrophy with bladder outlet obstruction symptoms. Over the last 3 days he stopped his clopidogrel and fludrocortisone because he thought these were making him dizzy. He presented to the clinic on the afternoon of admission with these symptoms and was admitted to the internal medicine teaching service for further evaluation and care. He denied any weakness or numbness that was new, falls, visual changes, or recent head trauma.  Physical Exam - key components related to admission:  Filed Vitals:   04/04/16 1800 04/04/16 2100 04/05/16 0540 04/05/16 0928  BP:  122/62 155/72 135/75  Pulse:  88 73 84  Temp:  98.1 F (36.7 C) 97.9 F (36.6 C) 97.9 F (36.6 C)  TempSrc:  Oral Oral Oral  Resp:  17 18 18   Height: 6\' 2"  (1.88 m)     Weight:   196 lb 3.4 oz (89 kg)   SpO2:  97% 98% 97%   Gen.: Well-developed, well-nourished, man lying comfortably in bed in no acute distress. Extremities: Without edema.  Lab results:  Basic Metabolic Panel:  Recent  Labs  04/04/16 1717 04/05/16 0009  NA 133* 137  K 6.1* 4.9  CL 102 108  CO2 17* 18*  GLUCOSE 88 113*  BUN 44* 51*  CREATININE 1.93* 1.93*  CALCIUM 9.4 8.6*   CBC:  Recent Labs  04/04/16 1717  WBC 6.0  HGB 13.5  HCT 42.2  MCV 91.1  PLT 240   Cardiac Enzymes:  Recent Labs  04/05/16 1110  CKTOTAL 103   CBG:  Recent Labs  04/04/16 1915 04/04/16 2032  GLUCAP 179* 140*   Misc. Labs:  Sedimentation rate 34  Other results:  EKG: Normal sinus rhythm at 82 bpm, normal axis, normal intervals, no significant Q waves, LVH by voltage in the precordial leads, early repolarization inferiorly an in V3 through V6, no T wave changes. The ECG is unchanged from the previous ECG on 03/09/2016.  Assessment & Plan by Problem:  Mr. Gago is a 75 year old man status post a left PICA ischemic stroke last month with resultant central vertigo, hyporeninemic hypoaldosteronism complicated by hyperkalemia and orthostatic hypotension, benign prosthetic hypertrophy with bladder outlet obstruction symptoms, stage III chronic kidney disease, and tobacco abuse who is admitted from the clinic with worsening dizziness 2 weeks and a left sided headache 3 days.  The worsening dizziness is likely related to the tamsulosin on top of his recent PICA infarct. The cause of the headache is unclear but resolved with Tylenol 650 mg. Thus hemorrhagic conversion of his recent stroke is unlikely.  1) Orthostatic hypotension: I agree with the housestaff's plan to increase the fludrocortisone to 0.2 mg by mouth  daily. I also agree with cutting the tamsulosin dose to 0.2 mg by mouth daily. Finally, I agree with starting the finasteride 5 mg by mouth daily so that eventually we may be able to stop the tamsulosin altogether given that the orthostatic hypotension and dizziness are likely to continue related to his hyporeninemic hypoaldosteronism and recent posterior circulation CVA.  2) Headache: Responded very well  to as needed Tylenol. He will be discharged on Tylenol 650 mg by mouth every 6 hours as needed for headaches.  3) Disposition: He is stable for discharge home today with follow-up in the Internal Medicine Center.

## 2016-04-05 NOTE — Discharge Summary (Signed)
Name: Derrick Hendrix MRN: [id number] DOB: 1941-11-06 75 y.o. PCP: [name], MD  Date of Admission: 04/04/2016  4:48 PM Date of Discharge: 04/05/2016 Attending Physician: Oval Linsey, MD  Discharge Diagnosis: 1. Unilateral headache 2. 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 Benign prostatic hypertrophy 4. 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     atorvastatin 80 MG tablet  Commonly known as:  LIPITOR  Take 1 tablet (80 mg total) by mouth daily.     clopidogrel 75 MG tablet  Commonly known as:  PLAVIX  Take 1 tablet (75 mg total) by mouth daily.     finasteride 5 MG tablet  Commonly known as:  PROSCAR  Take 1 tablet (5 mg total) by mouth daily.     fludrocortisone 0.1 MG tablet  Commonly known as:  FLORINEF  Take 2 tablets (0.2 mg total) by mouth daily in the morning.     furosemide 40 MG tablet  Commonly known as:  LASIX  Take 0.5 tablets (20 mg total) by mouth daily.     ipratropium 0.06 % nasal spray  Commonly known as:  ATROVENT  Place 2 sprays into the nose 3 (three) times daily.     nitroGLYCERIN 0.4 MG SL tablet  Commonly known as:  NITROSTAT  Place 1 tablet (0.4 mg total) under the tongue every 5 (five) minutes x 3 doses as needed for chest pain.     polyethylene glycol packet    Commonly known as:  MIRALAX  Take 17 g by mouth daily as needed (constipation).     tamsulosin 0.4 MG Caps capsule  Commonly known as:  FLOMAX  Take 1 capsule (0.4 mg total) by mouth daily after supper.       Disposition and follow-up:   [name] was discharged from [facility] in Good condition.  At the hospital follow up visit please address:  1. Check a BMP to see if his potassium has improved since increasing the fludrocortisone; if he is still hyperkalemic, consider changing furosemide '20mg'$  daily to patiromer 8.'4mg'$  daily  2. Check orthostatics to see if this improved since increasing the fludrocortisisone; if he is still orthostatic, consider changing furosemide '20mg'$  daily to patiromer 8.'4mg'$  daily. Can also consider adding midodrine.  3. If he is still having central vertigo, consider referral to vestibular rehabilitation  Follow-up Appointments:     Follow-up Information    Follow up with [place] [facility] [place].   Why:  We will call you for an appointment on Monday for one day on April 24-26   Contact information:   [address] [place] [phone]  Discharge Instructions: Discharge Instructions    Call MD for:  persistant dizziness or light-headedness    Complete by:  As directed      Call MD for:  temperature >100.4    Complete by:  As directed      Diet - low sodium heart healthy    Complete by:  As directed      Increase activity slowly    Complete by:  As directed            Admission HPI:   [name] is a 75 yo male with hyporeninemic hypoaldosteronism, ischemic stroke to the left PICA in March 2017,50 pack-year smoking history, chronic kidney disease stage 3, and benign prostatic hypertrophy who was admitted from the [facility] clinic today due to recurrent headaches, vertigo, and orthostatic hypotension.  Since his stroke last month, he has had vertigo that is particularly worse when  lying flat. Two weeks ago, he started tamsulosin for benign prostatic hypertrophy. Since then, he has felt progressively lightheaded upon standing. Additionally, 3 days ago, he began having a left-sided headache that is constant, not associated with nausea, and not worse with certain head movements. He has not tried anything to help alleviate the pain. For the last 3 days, he has not taken his fludrocortisone nor clopidogrel because he thought these may be making him dizzy. He denies new unilateral weakness or numbness, falling to one side, vision changes, fever, chest pain; review of systems was otherwise non-revealing.  Hospital Course by problem list:   1. Unilateral headache: His unilateral headache resolved entirely with acetaminophen, so we did not feel this was hemorrhagic conversion from his recent left PICA stroke. We also checked an ESR that was 42, ruling out giant cell arteritis. He was discharged on acetaminophen 1g every 6 hours as needed for headache. We opted to avoid NSAIDs to avoid worsening his hyperkalemia.  2. 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 These are both chronic problems for him. Regarding his hyperkalemia, he was discharged on furosemide '20mg'$  daily. Should he continue to be hyperkalemic and/or orthostatic, we can consider changing this to the intestinal potassium binder, patiromer 8.'4mg'$  daily. Regarding his orthostatic hypotension, we re-hydrated him overnight and increased his fludrocortisone from 0.1 to 0.'2mg'$  daily with hopes of alleviating both his hyperkalemia and orthostatic hypotension. We resumed his tamsulosin because his prostatic hypertrophy is quite symptomatic. Should he continue to be  orthostatic, we can consider adding midodrine.  3. Benign prostatic hypertrophy: We resumed his tamsulosin 0.'2mg'$  daily and added finasteride '5mg'$  daily, with hopes of stopping the tamsulosin in a few months to help alleviate his orthostatic hypotension due to its alpha-1 adrenergic inhibition.  4. History of left PICA ischemic stroke: He has stable central vertigo and may benefit from outpatient vestibular rehabilitation. He was on atorvastatin '40mg'$  daily; we increased this '80mg'$  daily upon discharge given his history of stroke. He was resumed on clopidogrel '75mg'$  daily as well.  Discharge Vitals:   BP 135/75 mmHg  Pulse 84  Temp(Src) 97.9 F (36.6 C) (Oral)  Resp 18  Ht '6\' 2"'$  (1.88 m)  Wt 196 lb 3.4 oz (89 kg)  BMI 25.18 kg/m2  SpO2 97%  Discharge Labs:  Results for orders placed or performed during the hospital encounter of 04/04/16 (from the past 24 hour(s))  Basic metabolic panel     Status: Abnormal   Collection Time: 04/04/16  5:17 PM  Result Value Ref Range   Sodium 133 (L) 135 - 145 mmol/L   Potassium 6.1 (HH) 3.5 - 5.1 mmol/L   Chloride 102 101 - 111 mmol/L   CO2 17 (L) 22 - 32 mmol/L   Glucose, Bld 88 65 - 99 mg/dL   BUN 44 (H) 6 - 20 mg/dL   Creatinine, Ser 1.93 (H) 0.61 - 1.24 mg/dL   Calcium 9.4 8.9 - 10.3 mg/dL   GFR calc non Af Amer 32 (L) >60 mL/min   GFR calc Af Amer 37 (L) >60 mL/min   Anion gap 14 5 - 15  CBC     Status: None   Collection Time: 04/04/16  5:17 PM  Result Value Ref Range   WBC 6.0 4.0 - 10.5 K/uL   RBC 4.63 4.22 - 5.81 MIL/uL   Hemoglobin 13.5 13.0 - 17.0 g/dL   HCT 42.2 39.0 - 52.0 %   MCV 91.1 78.0 - 100.0 fL   MCH 29.2 26.0 - 34.0 pg   MCHC 32.0 30.0 - 36.0 g/dL   RDW 15.0 11.5 - 15.5 %   Platelets 240 150 - 400 K/uL  Glucose, capillary     Status: Abnormal   Collection Time: 04/04/16  7:15 PM  Result Value Ref Range   Glucose-Capillary 179 (H) 65 - 99 mg/dL  Glucose, capillary     Status: Abnormal   Collection Time: 04/04/16  8:32  PM  Result Value Ref Range   Glucose-Capillary 140 (H) 65 - 99 mg/dL  Basic metabolic panel     Status: Abnormal   Collection Time: 04/05/16 12:09 AM  Result Value Ref Range   Sodium 137 135 - 145 mmol/L   Potassium 4.9 3.5 - 5.1 mmol/L   Chloride 108 101 - 111 mmol/L   CO2 18 (L) 22 - 32 mmol/L   Glucose, Bld 113 (H) 65 - 99 mg/dL   BUN 51 (H) 6 - 20 mg/dL   Creatinine, Ser 1.93 (H) 0.61 - 1.24 mg/dL   Calcium 8.6 (L) 8.9 - 10.3 mg/dL   GFR calc non Af Amer 32 (L) >60 mL/min   GFR calc Af Amer 37 (L) >60 mL/min   Anion gap 11 5 - 15  Sedimentation rate     Status: Abnormal   Collection Time: 04/05/16 12:09 AM  Result Value Ref Range   Sed Rate 34 (H) 0 - 16 mm/hr  CK     Status: None   Collection Time: 04/05/16 11:10 AM  Result Value Ref Range   Total CK 103 49 - 397 U/L    Signed: Loleta Chance, MD 04/05/2016, 12:40 PM

## 2016-04-05 NOTE — Progress Notes (Signed)
Patient ID: Derrick Hendrix, male   DOB: [birth year], 75 y.o.   MRN: [id number]   Subjective: Derrick Hendrix headache resolved with Tylenol. He still feels lightheaded when standing and has vertigo when lying flat. He has no other complaints and is eager to go home  Objective: Vital signs in last 24 hours: Filed Vitals:   04/04/16 1800 04/04/16 2100 04/05/16 0540 04/05/16 0928  BP:  122/62 155/72 135/75  Pulse:  88 73 84  Temp:  98.1 F (36.7 C) 97.9 F (36.6 C) 97.9 F (36.6 C)  TempSrc:  Oral Oral Oral  Resp:  '17 18 18  '$ Height: '6\' 2"'$  (1.88 m)     Weight:   196 lb 3.4 oz (89 kg)   SpO2:  97% 98% 97%   General: resting in bed comfortably, appropriately conversational Cardiac: regular rate and rhythm, no rubs, murmurs or gallops Pulm: breathing well, clear to auscultation bilaterally Ext: warm and well perfused, without pedal edema  Lab Results: Basic Metabolic Panel:  Recent Labs Lab 04/04/16 1717 04/05/16 0009  NA 133* 137  K 6.1* 4.9  CL 102 108  CO2 17* 18*  GLUCOSE 88 113*  BUN 44* 51*  CREATININE 1.93* 1.93*  CALCIUM 9.4 8.6*   Medications: I have reviewed the patient's current medications. Scheduled Meds: . atorvastatin  80 mg Oral Daily  . clopidogrel  75 mg Oral Daily  . enoxaparin (LOVENOX) injection  30 mg Subcutaneous Q24H  . finasteride  5 mg Oral Daily  . fludrocortisone  0.1 mg Oral Daily   Continuous Infusions: . sodium chloride 150 mL/hr at 04/05/16 0556   PRN Meds:.acetaminophen **OR** acetaminophen, polyethylene glycol   Assessment/Plan:  Derrick Hendrix is a 75 year old man with hyporeninemic hypoaldosteronism, ischemic stroke to the left PICA in March 2017, 50 pack-year smoking history, chronic kidney disease stage 3, and benign prostatic hypertrophy who was admitted from the [facility] clinic today due to unilateral headache, orthostatic hypotension, and hyperkalemia.  Unilateral headache: His headache resolved with Tylenol so I doubt this is hemorrhagic  conversion of his ischemic stroke last month. An ESR was normal, ruling out giant cell arteritis. -Will discharge on acetaminophen 1g every 6 hours as needed for headache -Will avoid NSAIDs to avoid hyperkalemia  Hyperkalemia: Potassium normalized with insulin and kayexalate. I've increased his fludrocortisone; I'm hoping the increased mineralocorticoid dose will help alleviate his hyperkalemia. We'll resume his furosemide upon discharge and I'll include consideration of switching to patiromer in the discharge summary -Increased fludrocortisone from 0.1 to 0.'2mg'$  daily -Will resume furosemide '20mg'$  daily -Can consider patiromer upon discharge  Orthostatic hypotension: He is still orthostatic despite being re-hydrated overnight; I suspect this is primarily due to his tamsulosin and hypoaldosteronism. We'll increase his fludrocortisone dose upon discharge. -Increased fludrocortisone from 0.1 to 0.'2mg'$  daily  Vertigo related to left PICA stroke: This has not changed since his stroke 1 month ago so I doubt recurrence. -Continue clopidogrel '75mg'$  daily -Will consider referral to vestibular rehabilitation upon discharge -Changed atorvastatin '40mg'$  to '80mg'$  daily given history of stroke  Benign prostatic hypertrophy: This is a serious issue for him but needs to be balanced to prevent orthostatic hypotension. -Continue tamsulosin 0.'2mg'$  daily -Started finasteride '5mg'$  daily  Dispo: Discharge home today.  The patient does have a current PCP [name], MD) and does need an [facility] hospital follow-up appointment after discharge.  The patient does not know have transportation limitations that hinder transportation to clinic appointments.  .Services Needed at time of  discharge: Y = Yes, Blank = No PT:   OT:   RN:   Equipment:   Other:     LOS: 1 day   Loleta Chance, MD 04/05/2016, 12:30 PM

## 2016-04-05 NOTE — Progress Notes (Signed)
Patient discharge teaching given, including activity, diet, follow-up appoints, and medications. Patient verbalized understanding of all discharge instructions. IV access was d/c'd. Vitals are stable. Skin is intact except as charted in most recent assessments. Pt to be escorted out by NT, to be driven home by family.  Ryleeann Urquiza, MBA, BSN, RN 

## 2016-04-05 NOTE — Plan of Care (Signed)
Problem: Pain Managment: Goal: General experience of comfort will improve Outcome: Progressing Pt had no complaints of pain throughout the night

## 2016-04-07 ENCOUNTER — Other Ambulatory Visit: Payer: Self-pay

## 2016-04-07 NOTE — Patient Outreach (Signed)
[facility]) Care Management  04/07/2016  Mathue Menken [birth year] [id number]  SUBJECTIVE; Telephone call to patient regarding EMMI stroke referral follow up. RNCM attempted contact with patient on listed home number. HIPAA compliant voice message left with call back phone number.  RNCM attempted contact with patient at listed mobile number. RNCM was able to reach patient.  HIPAA verified with patient.  Patient states he has a government phone and his minutes are low. Patient requested to proceed with conversation.  Patient confirms he saw his primary MD on 04/04/16.  Patient states he was admitted overnight and discharged on Saturday 04/05/16.  Patient states, " They gave me some medicine." Patient unable to state name of new medication. Patient states he has been unable to pick up his new prescription. Patients mobile number dropped call during conversation. Unable to reach patient back at home number or listed mobile number.  RNCM attempted to contact patients listed emergency contact, Feliciana Rossetti.  Emergency contact number is disconnected.   PLAN: RNCM will attempt to make contact with patient within 1 week.   [name] RN,BSN,CCM [facility] Telephonic  [phone]

## 2016-04-07 NOTE — Progress Notes (Signed)
Medicine attending: I personally interviewed and briefly examined this patient on the day of the patient visit and reviewed pertinent clinical ,laboratory, and radiographic data  with resident physician Dr. Julious Oka. I agree with her plan to have the patient admitted to the hospital for further evaluation. He is experiencing severe vertigo today. He has persistent orthostatic changes in blood pressure. He was recently admitted on March 19 through March 22 with an acute left posterior internal carotid artery distribution stroke involving the bilateral lateral basal ganglia . In addition, there was generalized cerebral atrophy and evidence of old lacunar infarcts. He was readmitted March 24 through March 26 with recurrent symptoms. No acute changes on repeat imaging. He has known hypo-renin hypoaldosteronism initially diagnosed in August 2015 for further evaluation of significant elevation of his potassium is 7.3. Aldosterone levels 08/14/2014 undetectable renin level low at 0.19 ( 0.25-5.8). He has  chronic renal insufficiency stage 3 with most recent creatinine 1.6 on 03/09/2016. He is supposed to be taking Florinef. He has poor health literacy. When he experiences symptoms above, he stops taking his medications. He is unsafe to drive home today. He needs further counseling and observation back on his medications.

## 2016-04-07 NOTE — Assessment & Plan Note (Signed)
Pt presents today for dizziness. He has stopped taking florinef and plavix b/c he thought they were causing his dizziness. He has not felt well since then and thought about going to the ED for his dizziness on Monday and Tuesday but decided to wait until he was seen in clinic today. He has positive orthostatic vitals today. Neuro exam was negative, however when handing him his AVS he develop acute onset dizziness while he was seated to the point he had to grab on to his chair and a nearby table. He lives alone and it was decided to admit patient while his medications were resumed.

## 2016-04-09 ENCOUNTER — Ambulatory Visit: Payer: Self-pay

## 2016-04-10 ENCOUNTER — Other Ambulatory Visit: Payer: Self-pay

## 2016-04-10 NOTE — Patient Outreach (Signed)
[facility]) Care Management  04/10/2016  Derrick Hendrix 11-24-1941 [id number]  Emmi Stroke Program    H/o 04/10/16 Inbound call to [facility] from patient's sister, Derrick Hendrix [id number] requesting call back regarding patient Derrick Hendrix. DOB 01/30/1941.    Outreach call #1 to contact.  Contact not reached and busy tone following several rings.   Inbound call / caller ID with the above number received back but contact hung up the phone once  RN CM identified [facility] and name.  RN CM scheduled for next contact call within one week.    Derrick Laster, RN, BSN, [facility], CCM  [employer] Management Coordinator [phone] Direct [phone] Cell [phone] Office [phone] Fax

## 2016-04-11 ENCOUNTER — Other Ambulatory Visit: Payer: Self-pay

## 2016-04-15 ENCOUNTER — Other Ambulatory Visit: Payer: Self-pay

## 2016-04-15 ENCOUNTER — Ambulatory Visit: Payer: Self-pay

## 2016-04-15 ENCOUNTER — Telehealth: Payer: Self-pay | Admitting: *Deleted

## 2016-04-15 DIAGNOSIS — I639 Cerebral infarction, unspecified: Secondary | ICD-10-CM

## 2016-04-15 NOTE — Telephone Encounter (Signed)
[facility] RN davina green calls: Pt has had severe h/a's and dizziness since march, these h/a's are from morning til night, he is very frustrated with this, he has a hard time explaining this due to his stutter that has increased since the CVA, please take time and allow him to explain as he becomes more frustrated and anxious when he cant get his thoughts across. states the pain is almost unbearable he has also been stumbling more and more also he is having L jaw pain when he drinks cold fluids that go from jaw to eye to hd She is having a community nurse to start seeing pt monthly for eval eye to eye if pt needs more nursing services than 1x mon will need HH referral Pt states he is taking all his medications and [facility] does a pill pack for him that is on a schedule so he states he is taking all his meds when he is suppose to It may be good for future appts to allow 1 hour due to time it takes to communicate

## 2016-04-15 NOTE — Patient Outreach (Signed)
[facility]) Care Management  04/15/2016  Derrick Hendrix Oct 23, 1941 [id number]   SUBJECTIVE:  Returned patients telephone call.  Patient states he needed to assistance with his transportation for his doctors appointment on tomorrow.  Patient states he spoke with [facility] but could not understand what they were talking about. Patient  States he has called to set up transportation but needed assistance confirming.  Patient reports he continues to have the headaches and dizziness since he has had the stroke. Patient states, "sometimes it my head hurts so bad its almost blinding." Patient states the headaches last his from the time he gets up in the morning until he goes to bed.  Patient states he goes outside sometimes to get some air. Patient states if he stays outside long enough his headache will ease down some.  Patient states, " I stumble a lot from the dizziness."  Patient states he was in the grocery store and had an episode of the dizziness and had to hold onto the shelf.  Patient states the dizziness and the headaches come and go. Patient denies having any problems with his vision.  Patient states his nose runs a lot. Patient states he also get pain occasionally if he drinks cold items in his left jaw and up to the top of his head.  Patient states if he gets upset or to excited  His blood pressure goes up.   Patient states he is slowing down with smoking. Patient states he was smoking a couple of packs per day.  Patient states he is now smoking approximately 8 cigarettes per day.   RNCM contacted [facility] and spoke with Katharine Look.  Confirmed patients pick up for Thursday May, 6, 2017.  Contacted patient and notified him that his pick up time with [facility] is 8:00pm-8:30pm with a return between 10:45 - 11:00am.  Patient informed that he can contact [facility] if his appointment time runs longer. Patient verbalized understanding and appreciation. RNCN discussed and offered patient  [facility] care management  services with community case manager. Patient verbally agreed to have community case management follow up.   RNCM contacted Dr. Marveen Reeks office and spoke with Bonnita Nasuti.  Discussed with Bonnita Nasuti patients continued symptoms of severe headaches and dizziness, stumbling and left jaw pain. Informed Bonnita Nasuti that patient has been referred to [facility] care management community case manager. Informed Bonnita Nasuti that case manager has up to 10 business day to contact patient to schedule appointment.  Advised when patient is assessed at appointment if nursing follow up needed sooner referral to home health agency could be an option.   ASSESSMENT:  EMMI stroke transition program.   Patient was seen on 03/20/16 at primary MD office with complaints of dizziness.  Medication terazosin discontinued and patient given prescription for Tamsulosin. Patient seen on 04/04/16 at primary MD office with complaints of dizziness and headache.  Per MD note patient was positive for orthostatic vitals.  Patient was admitted 04/04/16 to 04/05/16 for observation  PLAN:  RNCM referred patient to [facility] care management community case manager.   [name] RN,BSN,CCM [facility] Telephonic  [phone]

## 2016-04-17 ENCOUNTER — Ambulatory Visit (HOSPITAL_COMMUNITY)
Admission: RE | Admit: 2016-04-17 | Discharge: 2016-04-17 | Disposition: A | Discharge status: Home / Self Care | Payer: Medicare Other | Source: Ambulatory Visit | Attending: Family Medicine | Admitting: Family Medicine

## 2016-04-17 ENCOUNTER — Encounter: Payer: Self-pay | Admitting: Internal Medicine

## 2016-04-17 ENCOUNTER — Encounter (HOSPITAL_COMMUNITY): Payer: Self-pay | Admitting: General Practice

## 2016-04-17 ENCOUNTER — Observation Stay (HOSPITAL_COMMUNITY)
Admission: AD | Admit: 2016-04-17 | Discharge: 2016-04-18 | Disposition: A | Discharge status: Home / Self Care | Payer: Medicare Other | Source: Ambulatory Visit | Attending: Internal Medicine | Admitting: Internal Medicine

## 2016-04-17 ENCOUNTER — Ambulatory Visit (INDEPENDENT_AMBULATORY_CARE_PROVIDER_SITE_OTHER): Payer: Medicare Other | Admitting: Internal Medicine

## 2016-04-17 VITALS — BP 120/67 | HR 91 | Temp 97.6°F | Ht 74.0 in | Wt 185.2 lb

## 2016-04-17 DIAGNOSIS — Z8673 Personal history of transient ischemic attack (TIA), and cerebral infarction without residual deficits: Secondary | ICD-10-CM | POA: Insufficient documentation

## 2016-04-17 DIAGNOSIS — E875 Hyperkalemia: Secondary | ICD-10-CM | POA: Diagnosis not present

## 2016-04-17 DIAGNOSIS — I951 Orthostatic hypotension: Secondary | ICD-10-CM | POA: Diagnosis not present

## 2016-04-17 DIAGNOSIS — F17210 Nicotine dependence, cigarettes, uncomplicated: Secondary | ICD-10-CM | POA: Insufficient documentation

## 2016-04-17 DIAGNOSIS — I129 Hypertensive chronic kidney disease with stage 1 through stage 4 chronic kidney disease, or unspecified chronic kidney disease: Secondary | ICD-10-CM | POA: Diagnosis not present

## 2016-04-17 DIAGNOSIS — N183 Chronic kidney disease, stage 3 unspecified: Secondary | ICD-10-CM

## 2016-04-17 DIAGNOSIS — I10 Essential (primary) hypertension: Secondary | ICD-10-CM | POA: Diagnosis present

## 2016-04-17 DIAGNOSIS — N401 Enlarged prostate with lower urinary tract symptoms: Secondary | ICD-10-CM

## 2016-04-17 DIAGNOSIS — E2749 Other adrenocortical insufficiency: Secondary | ICD-10-CM | POA: Diagnosis not present

## 2016-04-17 DIAGNOSIS — N138 Other obstructive and reflux uropathy: Secondary | ICD-10-CM | POA: Diagnosis present

## 2016-04-17 DIAGNOSIS — R51 Headache: Secondary | ICD-10-CM

## 2016-04-17 DIAGNOSIS — R519 Headache, unspecified: Secondary | ICD-10-CM | POA: Diagnosis present

## 2016-04-17 HISTORY — DX: Cardiac murmur, unspecified: R01.1

## 2016-04-17 HISTORY — DX: Peripheral vascular disease, unspecified: I73.9

## 2016-04-17 HISTORY — DX: Headache: R51

## 2016-04-17 HISTORY — DX: Major depressive disorder, single episode, unspecified: F32.9

## 2016-04-17 HISTORY — DX: Chronic kidney disease, stage 3 (moderate): N18.3

## 2016-04-17 HISTORY — DX: Headache, unspecified: R51.9

## 2016-04-17 HISTORY — DX: Depression, unspecified: F32.A

## 2016-04-17 HISTORY — DX: Chronic kidney disease, stage 3 unspecified: N18.30

## 2016-04-17 HISTORY — DX: Pure hypercholesterolemia, unspecified: E78.00

## 2016-04-17 LAB — BASIC METABOLIC PANEL
ANION GAP: 10 (ref 5–15)
Anion gap: 8 (ref 5–15)
Anion gap: 9 (ref 5–15)
BUN: 38 mg/dL — AB (ref 6–20)
BUN: 36 mg/dL — AB (ref 6–20)
BUN: 31 mg/dL — AB (ref 6–20)
CALCIUM: 9.6 mg/dL (ref 8.9–10.3)
CALCIUM: 8.7 mg/dL — AB (ref 8.9–10.3)
CHLORIDE: 115 mmol/L — AB (ref 101–111)
CO2: 22 mmol/L (ref 22–32)
CO2: 16 mmol/L — AB (ref 22–32)
CO2: 18 mmol/L — AB (ref 22–32)
CREATININE: 1.65 mg/dL — AB (ref 0.61–1.24)
CREATININE: 1.61 mg/dL — AB (ref 0.61–1.24)
CREATININE: 1.53 mg/dL — AB (ref 0.61–1.24)
Calcium: 8.8 mg/dL — ABNORMAL LOW (ref 8.9–10.3)
Chloride: 110 mmol/L (ref 101–111)
Chloride: 111 mmol/L (ref 101–111)
GFR calc Af Amer: 45 mL/min — ABNORMAL LOW (ref >60)
GFR calc Af Amer: 46 mL/min — ABNORMAL LOW (ref >60)
GFR calc Af Amer: 49 mL/min — ABNORMAL LOW (ref >60)
GFR calc non Af Amer: 38 mL/min — ABNORMAL LOW (ref >60)
GFR calc non Af Amer: 40 mL/min — ABNORMAL LOW (ref >60)
GFR calc non Af Amer: 42 mL/min — ABNORMAL LOW (ref >60)
GLUCOSE: 103 mg/dL — AB (ref 65–99)
GLUCOSE: 182 mg/dL — AB (ref 65–99)
Glucose, Bld: 121 mg/dL — ABNORMAL HIGH (ref 65–99)
Potassium: 6.5 mmol/L (ref 3.5–5.1)
Potassium: 5.3 mmol/L — ABNORMAL HIGH (ref 3.5–5.1)
Potassium: 4.7 mmol/L (ref 3.5–5.1)
Sodium: 140 mmol/L (ref 135–145)
Sodium: 137 mmol/L (ref 135–145)
Sodium: 142 mmol/L (ref 135–145)

## 2016-04-17 LAB — CBC
HEMATOCRIT: 38.3 % — AB (ref 39.0–52.0)
HEMOGLOBIN: 11.9 g/dL — AB (ref 13.0–17.0)
MCH: 29.5 pg (ref 26.0–34.0)
MCHC: 31.1 g/dL (ref 30.0–36.0)
MCV: 95 fL (ref 78.0–100.0)
Platelets: 278 10*3/uL (ref 150–400)
RBC: 4.03 MIL/uL — ABNORMAL LOW (ref 4.22–5.81)
RDW: 15.7 % — ABNORMAL HIGH (ref 11.5–15.5)
WBC: 5.6 10*3/uL (ref 4.0–10.5)

## 2016-04-17 MED ORDER — ALBUTEROL SULFATE (2.5 MG/3ML) 0.083% IN NEBU
2.5000 mg | INHALATION_SOLUTION | Freq: Once | RESPIRATORY_TRACT | Status: AC
  Administered 2016-04-17: 2.5 mg via RESPIRATORY_TRACT

## 2016-04-17 MED ORDER — SODIUM POLYSTYRENE SULFONATE 15 GM/60ML PO SUSP
30.0000 g | Freq: Once | ORAL | Status: AC
  Administered 2016-04-17: 30 g via ORAL

## 2016-04-17 MED ORDER — SODIUM CHLORIDE 0.9% FLUSH: 3.0000 mL | Freq: Two times a day (BID) | INTRAVENOUS | Status: DC

## 2016-04-17 MED ORDER — SODIUM CHLORIDE 0.9 % IV SOLN
1.0000 g | INTRAVENOUS | Status: AC
  Administered 2016-04-17: 1 g via INTRAVENOUS
  Filled 2016-04-17: qty 10

## 2016-04-17 MED ORDER — SODIUM CHLORIDE 0.9 % IV SOLN: 1.0000 g | Freq: Once | INTRAVENOUS | Status: DC

## 2016-04-17 MED ORDER — PATIROMER SORBITEX CALCIUM 8.4 G PO PACK: 8.4000 g | PACK | Freq: Every day | ORAL | Status: DC

## 2016-04-17 MED ORDER — SODIUM POLYSTYRENE SULFONATE 15 GM/60ML PO SUSP
30.0000 g | Freq: Once | ORAL | Status: DC
  Filled 2016-04-17: qty 120

## 2016-04-17 MED ORDER — NITROGLYCERIN 0.4 MG SL SUBL: 0.4000 mg | SUBLINGUAL_TABLET | SUBLINGUAL | Status: DC | PRN

## 2016-04-17 MED ORDER — FLUDROCORTISONE ACETATE 0.1 MG PO TABS
0.2000 mg | ORAL_TABLET | Freq: Every day | ORAL | Status: DC
  Administered 2016-04-18: 0.2 mg via ORAL
  Filled 2016-04-17: qty 2

## 2016-04-17 MED ORDER — ENOXAPARIN SODIUM 30 MG/0.3ML ~~LOC~~ SOLN
30.0000 mg | SUBCUTANEOUS | Status: DC
  Administered 2016-04-17: 30 mg via SUBCUTANEOUS
  Filled 2016-04-17: qty 0.3

## 2016-04-17 MED ORDER — FINASTERIDE 5 MG PO TABS
5.0000 mg | ORAL_TABLET | Freq: Every day | ORAL | Status: DC
  Administered 2016-04-17 – 2016-04-18 (×2): 5 mg via ORAL
  Filled 2016-04-17 (×2): qty 1

## 2016-04-17 MED ORDER — SODIUM CHLORIDE 0.9 % IV BOLUS (SEPSIS)
1000.0000 mL | Freq: Once | INTRAVENOUS | Status: AC
  Administered 2016-04-17: 1000 mL via INTRAVENOUS

## 2016-04-17 MED ORDER — ACETAMINOPHEN 325 MG PO TABS
650.0000 mg | ORAL_TABLET | Freq: Four times a day (QID) | ORAL | Status: DC | PRN
  Administered 2016-04-17: 650 mg via ORAL
  Filled 2016-04-17: qty 2

## 2016-04-17 MED ORDER — ATORVASTATIN CALCIUM 80 MG PO TABS
80.0000 mg | ORAL_TABLET | Freq: Every day | ORAL | Status: DC
  Administered 2016-04-17 – 2016-04-18 (×2): 80 mg via ORAL
  Filled 2016-04-17 (×2): qty 1

## 2016-04-17 MED ORDER — CLOPIDOGREL BISULFATE 75 MG PO TABS
75.0000 mg | ORAL_TABLET | Freq: Every day | ORAL | Status: DC
  Administered 2016-04-17 – 2016-04-18 (×2): 75 mg via ORAL
  Filled 2016-04-17 (×2): qty 1

## 2016-04-17 MED ORDER — SODIUM POLYSTYRENE SULFONATE 15 GM/60ML PO SUSP
30.0000 g | Freq: Once | ORAL | Status: AC
  Administered 2016-04-17: 30 g via ORAL
  Filled 2016-04-17: qty 120

## 2016-04-17 MED ORDER — SODIUM CHLORIDE 0.9% FLUSH
3.0000 mL | Freq: Two times a day (BID) | INTRAVENOUS | Status: DC
  Administered 2016-04-17: 3 mL via INTRAVENOUS

## 2016-04-17 NOTE — Assessment & Plan Note (Addendum)
A: Driven by hyporeninemic hypoaldosteronism. Patient has been taking 20 mg furosemide daily, yet his K is 6.5 today in clinic with T wave changes on EKG (compared to 3/31 EKG). The ineffectiveness of his furosemide could be, in part, due to his CKD. Given the patient's orthostatic hypotension in clinic today, we had planned on discontinuing the furosemide in favor of patiromer, but in light of his EKG findings, he is more appropriate for admission. In the clinic, we have given 30 mg of Kayexalate with an Albuterol nebulizer treatment. We are unable to give calcium gluconate in clinic for cardioprotection, so it is important that he be admitted to a Telemetry bed as soon as possible. Other than his EKG changes, he is not overtly symptomatic (no  weakness, chest pain, palpitations), although his headache could potentially be attributable to high potassium, causing vasoconstriction and migraine-line headaches.   P: Kayexalate 30 g and albuterol treatment now Admit to Telemetry

## 2016-04-17 NOTE — Progress Notes (Signed)
Report called to [facility] on [address].  Patient transported via wheelchair to [address].  Patient alert oriented .  Sander Nephew, RN 04/17/2016 2:01 PM

## 2016-04-17 NOTE — Assessment & Plan Note (Addendum)
A: Performed orthostatic vital signs in clinic.  Filed Vitals:    04/17/16 1245 04/17/16 1246 04/17/16 1247  BP:  138/75 119/72 120/67  Pulse:  73 93 91    Lying (5 minutes) Standing (1 minute) Standing (3 minutes)   Given his orthostatic vital signs, we had planned on discontinuing his furosemide in favor of a potassium-reducing agent that may not influence his volume status as robustly (Patiromer). Unfortunately, the hyporeninemia which is driving most of his orthostatic hypotension is appears to be maxed out in terms of treatment (0.2 mg fludrocortisone daily).   P: Given his admission, we will defer management of his orthostatic hypotension to the the admitting team, given that his hyperkalemia is the more pressing issue. I have already discontinued his furosemide, which may have been driving some of his orthostatic symptoms.

## 2016-04-17 NOTE — H&P (Signed)
Date: 04/17/2016               Patient Name:  Derrick Hendrix MRN: [id number]  DOB: 1941/12/08 Age / Sex: 75 y.o., male   PCP: [name], MD         Medical Service: Internal Medicine Teaching Service         Attending Physician: Dr. Aldine Contes, MD    First Contact: Dr. Blane Ohara Pager: [phone]  Second Contact: Dr. Jacques Earthly Pager: [phone]       After Hours (After 5p/  First Contact Pager: [phone]  weekends / holidays): Second Contact Pager: [phone]   Chief Complaint: Headache, dizziness, and hyperkalemia  History of Present Illness: Derrick Hendrix is a 75 year old 50py-smoker with hyporeninemic hypoaldosteronism, BPH, L PICA CVA in 3/17, and CKD stage III who presents from clinic with hyperkalemia as well as headache and dizziness. The presented to our clinic this morning for a follow-up for his episodes of headache as well as following his hyperkalemia. He is said that since his discharge on 4/22 he has continued to have persistent left-sided headaches associated with left-sided blurry vision. He describes the headache as aching, confined to the left side of his face, occasionally radiating down to his neck and to just past his ear. The headache is particularly worse after he takes the furosemide. It is also worse right when he sits up or lies down. When he does sit up or lie down, he also has associated lightheadedness that resolves with standing still and holding onto something for a few seconds. Resting and taking Tylenol do improve the headache. He describes no difficulty in ambulation or any recent falls, no auras, nausea, or vomiting, Excessive tearing, focal numbness or weakness, sensation that the room is spinning, syncope, chest pain, shortness of breath, palpitations or any other symptoms at this time.  He presented to the clinic for follow-up of these symptoms today, and on recheck of his potassium was noted to be elevated at 6.5. Orthostatic vitals were  positive as well. An EKG showed worsening of his chronic T-wave changes. He was given albuterol as well as Kayexalate in the clinic and our team was asked to admit the patient for further management of his hyperkalemia. He has not yet had a bowel movement.  Meds: Current Facility-Administered Medications  Medication Dose Route Frequency Provider Last Rate Last Dose  . calcium gluconate 1 g in sodium chloride 0.9 % 100 mL IVPB  1 g Intravenous NOW Norval Gable, MD      . enoxaparin (LOVENOX) injection 30 mg  30 mg Subcutaneous Q24H Francesca Oman, DO      . sodium chloride flush (NS) 0.9 % injection 3 mL  3 mL Intravenous Q12H Francesca Oman, DO      . sodium chloride flush (NS) 0.9 % injection 3 mL  3 mL Intravenous Q12H Francesca Oman, DO       Facility-Administered Medications Ordered in Other Encounters  Medication Dose Route Frequency Provider Last Rate Last Dose  . sodium polystyrene (KAYEXALATE) 15 GM/60ML suspension 30 g  30 g Oral Once Liberty Handy, MD        Allergies: Allergies as of 04/17/2016 - Review Complete 04/17/2016  Allergen Reaction Noted  . Aspirin Other (See Comments) 08/24/2014   Past Medical History  Diagnosis Date  . Hypertension   . Headache(784.0)   . Hyperkalemia 08/2014  . Stroke ([place])     hx of PAD  .  Chronic kidney disease     HX OF CKD 3   Past Surgical History  Procedure Laterality Date  . Ep implantable device N/A 03/04/2016    Procedure: Loop Recorder Insertion;  Surgeon: Thompson Grayer, MD;  Location: [facility];  Service: Cardiovascular;  Laterality: N/A;   Family History  Problem Relation Age of Onset  . Hypertension Mother     died age 18  . Diabetes Mother    Social History   Social History  . Marital Status: Single    Spouse Name: N/A  . Number of Children: N/A  . Years of Education: N/A   Occupational History  . Not on file.   Social History Main Topics  . Smoking status: Current Every Day Smoker -- 0.50 packs/day for  40 years    Types: Cigarettes  . Smokeless tobacco: Never Used     Comment: cutting back.  . Alcohol Use: No     Comment: Liquor once a week.  . Drug Use: No  . Sexual Activity: Not on file   Other Topics Concern  . Not on file   Social History Narrative   Works in the yard          Review of Systems: Pertinent items noted in HPI and remainder of comprehensive ROS otherwise negative.  Physical Exam: Blood pressure 152/82, pulse 78, temperature 97.8 F (36.6 C), temperature source Oral, height 6\' 2"  (1.88 m), weight 185 lb (83.915 kg), SpO2 98 %.   Gen: Well-appearing, alert and oriented to person, place, and time HEENT: Oropharynx clear without erythema or exudate. Maxillary teethedentulous, poor dentition in mandibular teeth throughout with no apparent signs of abscess or infection. Neck: No cervical LAD, no thyromegaly or nodules, no JVD noted. CV: Normal rate, regular rhythm, no murmurs, rubs, or gallops Pulmonary: Normal effort, CTA bilaterally, no crackles or wheezes Abdominal: Soft, non-tender, non-distended, without rebound, guarding, or masses Extremities: Distal pulses 2+ in upper and lower extremities bilaterally, no tenderness, erythema or edema Neuro: CN II-XII grossly intact, no focal weakness or sensory deficits noted. Patient does have 1-2 beats of horizontal nystagmus with right lateral gaze. No tremors. No finger-to-nose dysmetria, no dysdiadochokinesia. Skin: No atypical appearing moles. No rashes  Lab results: Basic Metabolic Panel:  Recent Labs  04/17/16 1043  NA 140  K 6.5*  CL 110  CO2 22  GLUCOSE 103*  BUN 38*  CREATININE 1.65*  CALCIUM 9.6   Other results: EKG: normal sinus rhythm, peaked T waves.  Assessment & Plan by Problem: Active Problems:   Hyperkalemia 1. Hyperkalemia secondary to hyporeninemic hypoaldosteronism - Patient has to history of several hospitalizations for hyperkalemia with EKG changes due to his underlying  hyporeninemic hypoaldosteronism. Patient is currently asymptomatic, but was found to have K of 6.5 with EKG changes in clinic today. Was given Kayexalate and albuterol in clinic. Has his repeat K is 5.3, he is responding well to the above measures. -Calcium gluconate 1 now -Continue Kayexalate -Telemetry -Repeat EKG in the a.m. -Continue home fludrocortisone; hold furosemide -Consider discontinuing furosemide and starting Patiromer on discharge  2. Orthostatic hypotension - patient with headache and dizziness consistent with a tension headache as well as orthostatic hypotension confirmed in clinic today. Patient is on both tamsulosin and furosemide, which are the likely culprits for his orthostatics. His symptoms are not suggestive of a migraine or tension headache, and he has no signs of a dental infection, so his headache is most likely due to this. -IV  bolus x 1 now -Acetaminophen when necessary for headache -Follow-up next BMP -Hold furosemide, tamsulosin  3. BPH  -Continue finasteride, hold tamsulosin -Consider trial off of tamsulosin after discharge with reassessment of urinary symptoms  4. History of PICA CVA - the patient does endorse headache and dizziness, which are similar symptoms to his previous stroke, these are chronic and unchanged in nature and associated particularly with sitting up or standing down with positive orthostatic hypotension. His neuro exam is essentially normal with the exception of slight horizontal beating nystagmus. Our suspicion for CVA is low at this time. -Continue clopidogrel -Counseled patient on smoking cessation; he is interested in nicotine patches upon discharge. Consider assistance with our clinic and providing these cost has prohibited him in the past -Continue to monitor clinically   Dispo: Disposition is deferred at this time, awaiting improvement of current medical problems. Anticipated discharge in approximately 1-2 day(s).   The patient  does have a current PCP [name], MD) and does need an [facility] hospital follow-up appointment after discharge.  The patient does not have transportation limitations that hinder transportation to clinic appointments.  Signed: Norval Gable, MD 04/17/2016, 3:12 PM

## 2016-04-17 NOTE — Progress Notes (Signed)
Spoke with Dr. Lysbeth Galas about workup. Feels dizziness is related to orthostatic. No stroke workup needed. Stated neuro exam was normal. Carroll Kinds RN

## 2016-04-17 NOTE — Patient Instructions (Signed)
Derrick Hendrix,  It was a pleasure taking care of you today.  For your headaches, I think this is associated with low blood pressure upon standing.  To treat this, we will stop the Lasix (Furosemide). Instead you will start taking Patiromer 8.4 g daily. This will treat your high potassium.  We will check your potassium today.   If your symptoms are not better in 2 weeks, please return to the clinic.   For appointments, please call [phone]  The office is closed from 12 pm to 1 pm. Please call before 4 pm.

## 2016-04-17 NOTE — Assessment & Plan Note (Signed)
Driving orthostatic hypotension and hyperkalemia. See these respective problems for assessment and plans.

## 2016-04-17 NOTE — Progress Notes (Signed)
Internal Medicine Clinic Attending  I saw and evaluated the patient.  I personally confirmed the key portions of the history and exam documented by Dr. Ford and I reviewed pertinent patient test results.  The assessment, diagnosis, and plan were formulated together and I agree with the documentation in the resident's note. 

## 2016-04-17 NOTE — Progress Notes (Signed)
   Subjective:    Patient ID: Derrick Hendrix, male    DOB: 1941-09-20, 75 y.o.   MRN: [id number]  HPI  Derrick Hendrix is a 75 year old gentleman with a PMH of L PICA CVA (02/2016), hyporeninemia hypoaldosteronism, CKD Stage 3 who comes to the clinic for hospital follow-up from episodes of vertigo, headaches, and hyperkalemia. Since discharge on 4/22, Derrick Hendrix has complained of persistent left sided headaches and left sided blurry.These are worsened upon standing and almost resolve with rest. He says he does not feel light-headed on standing. He denies any blackouts or falls. He denies have heart palpitations, chest pain, or dyspnea. He denies any sensation as if the room is spinning. He reports being able to ambulate reasonable distances without difficulty. He denies any weakness. He brings his medications with him today and reports good adherence to fludrocortisone, clopidogrel, furosemide, tamsulosin, and finasteride. However, clopidogrel is notably absent from his medication bag today, and he tells me his friend had it with him.  Review of Systems  Constitutional: Negative for fever, chills and fatigue.  HENT: Negative for congestion, rhinorrhea and tinnitus.   Eyes: Positive for visual disturbance. Negative for pain.  Respiratory: Negative for shortness of breath and wheezing.   Cardiovascular: Negative for chest pain, palpitations and leg swelling.  Gastrointestinal: Negative for abdominal pain and diarrhea.  Genitourinary: Positive for urgency. Negative for dysuria, decreased urine volume and difficulty urinating.  Musculoskeletal: Negative for back pain and arthralgias.  Neurological: Positive for headaches. Negative for tremors, syncope, weakness and light-headedness.       Objective:   Physical Exam  General: Sitting in chair, NAD HEENT: [place]/AT. Soft tissue growth on left side of head (been there for years), MMM, no tonsillar exudate or erythema, no carotid bruits, no cervical  adenopathy Cardiovascular: RRR, no m/r/g Pulmonary: CTAB. Unlabored Breathing Abdominal: Soft NT/ND. Normal BS Extremities: No clubbing, cyanosis or edema Neurological: Stuttering speech. AAOx3. 5/5 strength in all extremities. EOMI, no nystagmus, no tremors. FTN normal. Psychiatric: Normal behavior and affect       Assessment & Plan:   Please see problem based assessment and plan for details.

## 2016-04-17 NOTE — H&P (Signed)
Date: 04/17/2016               Patient Name:  Derrick Hendrix MRN: [id number]  DOB: 1941/08/14 Age / Sex: 75 y.o., male   PCP: [name], MD              Medical Service: Internal Medicine Teaching Service              Attending Physician: Dr. Aldine Contes, MD    First Contact: Vilinda Flake, MS3 Pager: [phone]  Second Contact: Dr. Merrilyn Puma Pager: [phone]  Third Contact Dr. Randell Patient  Pager: [phone]       After Hours (After 5p/  First Contact Pager: [phone]  weekends / holidays): Second Contact Pager: [phone]   Chief Complaint: Headache  History of Present Illness: [name] is a 75 year old man who is status post left PICA ischemic stroke in March with a history of hyporeninemic, hypoaldosteronism, orthostatic hypotension, benign prostatic hypertrophy with bladder outlet obstruction symptoms, stage III CKD, and tobacco use. [name] was being seen in clinic today for follow up. He was subsequently admitted to our service due to hyperkalemia secondary to hypoaldosteronism with T wave changes on EKG. He has also been complaining of severe headache that has persisted for the last two weeks. [name] describes the headache as a throbbing headache that is located on the left side of his face and head just behind his ear. He says the pain is similar to that when he had his stroke in March, but less severe. He reports that the headache is better with laying down and gets worse when standing. [name] has tried taking Tylenol but that only helps slightly. The patient also reports associated dizziness with standing and radiating pain down his neck with the headache. The patient denied N/V, aura, vertigo, excessive tearing, photophobia, localization behind the eye or syncope associated with headaches.  The patient denied chest pain, SOB, cough, fever, chills, N/V. He endorses increased frequency in urination which can be attributed to furosemide.  Meds: Current  Facility-Administered Medications  Medication Dose Route Frequency Provider Last Rate Last Dose  . calcium gluconate 1 g in sodium chloride 0.9 % 100 mL IVPB  1 g Intravenous NOW Norval Gable, MD      . enoxaparin (LOVENOX) injection 30 mg  30 mg Subcutaneous Q24H Francesca Oman, DO      . sodium chloride flush (NS) 0.9 % injection 3 mL  3 mL Intravenous Q12H Francesca Oman, DO      . sodium chloride flush (NS) 0.9 % injection 3 mL  3 mL Intravenous Q12H Francesca Oman, DO       Facility-Administered Medications Ordered in Other Encounters  Medication Dose Route Frequency Provider Last Rate Last Dose  . sodium polystyrene (KAYEXALATE) 15 GM/60ML suspension 30 g  30 g Oral Once Liberty Handy, MD        Allergies: Allergies as of 04/17/2016 - Review Complete 04/17/2016  Allergen Reaction Noted  . Aspirin Other (See Comments) 08/24/2014   Past Medical History  Diagnosis Date  . Hypertension   . Headache(784.0)   . Hyperkalemia 08/2014  . Stroke ([place])     hx of PAD  . Chronic kidney disease     HX OF CKD 3   Past Surgical History  Procedure Laterality Date  . Ep implantable device N/A 03/04/2016    Procedure: Loop Recorder Insertion;  Surgeon: Thompson Grayer, MD;  Location: [facility];  Service: Cardiovascular;  Laterality: N/A;   Family History  Problem Relation Age of Onset  . Hypertension Mother     died age 42  . Diabetes Mother    Social History   Social History  . Marital Status: Single    Spouse Name: N/A  . Number of Children: N/A  . Years of Education: N/A   Occupational History  . Not on file.   Social History Main Topics  . Smoking status: Current Every Day Smoker -- 0.50 packs/day for 40 years    Types: Cigarettes  . Smokeless tobacco: Never Used     Comment: cutting back.  . Alcohol Use: No     Comment: Liquor once a week.  . Drug Use: No  . Sexual Activity: Not on file   Other Topics Concern  . Not on file   Social History Narrative    Works in the yard          Review of Systems: Pertinent items are noted in HPI.  Physical Exam: Blood pressure 152/82, pulse 78, temperature 97.8 F (36.6 C), temperature source Oral, height 6\' 2"  (1.88 m), weight 83.915 kg (185 lb), SpO2 98 %. BP 152/82 mmHg  Pulse 78  Temp(Src) 97.8 F (36.6 C) (Oral)  Ht 6\' 2"  (1.88 m)  Wt 83.915 kg (185 lb)  BMI 23.74 kg/m2  SpO2 98% General appearance: alert and cooperative  Neuro: horizontal beating nystagmus, no weakness or sensory deficits Head: Normocephalic, without obvious abnormality, atraumatic Eyes: conjunctivae/corneas clear. PERRL, EOM's intact. Fundi benign. Lungs: clear to auscultation bilaterally Heart: regular rate and rhythm, S1, S2 normal, no murmur, click, rub or gallop Abdomen: soft, non-tender; bowel sounds normal; no masses,  no organomegaly  Lab results: Basic Metabolic Panel:  Recent Labs  04/17/16 1043  NA 140  K 6.5*  CL 110  CO2 22  GLUCOSE 103*  BUN 38*  CREATININE 1.65*  CALCIUM 9.6   Liver Function Tests: No results for input(s): AST, ALT, ALKPHOS, BILITOT, PROT, ALBUMIN in the last 72 hours. No results for input(s): LIPASE, AMYLASE in the last 72 hours. No results for input(s): AMMONIA in the last 72 hours. CBC: No results for input(s): WBC, NEUTROABS, HGB, HCT, MCV, PLT in the last 72 hours. Cardiac Enzymes: No results for input(s): CKTOTAL, CKMB, CKMBINDEX, TROPONINI in the last 72 hours. BNP: No results for input(s): PROBNP in the last 72 hours. D-Dimer: No results for input(s): DDIMER in the last 72 hours. CBG: No results for input(s): GLUCAP in the last 72 hours. Hemoglobin A1C: No results for input(s): HGBA1C in the last 72 hours. Fasting Lipid Panel: No results for input(s): CHOL, HDL, LDLCALC, TRIG, CHOLHDL, LDLDIRECT in the last 72 hours. Thyroid Function Tests: No results for input(s): TSH, T4TOTAL, FREET4, T3FREE, THYROIDAB in the last 72 hours. Anemia Panel: No results for  input(s): VITAMINB12, FOLATE, FERRITIN, TIBC, IRON, RETICCTPCT in the last 72 hours. Coagulation: No results for input(s): LABPROT, INR in the last 72 hours. Urine Drug Screen: Drugs of Abuse     Component Value Date/Time   LABOPIA NONE DETECTED 06/26/2014 1552   COCAINSCRNUR NONE DETECTED 06/26/2014 1552   LABBENZ NONE DETECTED 06/26/2014 1552   AMPHETMU NONE DETECTED 06/26/2014 1552   THCU NONE DETECTED 06/26/2014 1552   LABBARB NONE DETECTED 06/26/2014 1552    Alcohol Level: No results for input(s): ETH in the last 72 hours. Urinalysis: No results for input(s): COLORURINE, LABSPEC, PHURINE, GLUCOSEU, HGBUR, BILIRUBINUR, KETONESUR, PROTEINUR, UROBILINOGEN, NITRITE, LEUKOCYTESUR in  the last 72 hours.  Invalid input(s): APPERANCEUR Misc. Labs: No results found.  Imaging results:  No results found.  Other results: EKG: normal sinus rhythm, peaked T waves changes.  Assessment & Plan by Problem: Mr. Daft is a 75 year old man who is status post left PICA ischemic stroke in March with a history of hyporeninemic, hypoaldosteronism complicated by hyperkalemia and orthostatic hypotension, benign prostatic hypertrophy with bladder outlet obstruction symptoms, stage III CKD, and tobacco use who is found to have peaked T waves on EKG.  1. Hyperkalemia due to hyporeninemic hypoaldosteronism -- Patient has history of multiple episodes of hyperkalemia with EKG changes in the past. Patient is asymptomatic but clinic BMP showed hyperkalemia and EKG showed peaked T waves. Patient has orthostatic changes in sitting/standing BP measurement, as well as associated dizziness and headache with standing. Most recent K+ 5.3 -will discontinue furosemide, continue kayexalate -start patiromer   2. Headache/orthostatic hypotension--Patient's symptoms worsen with standing and alleviated with rest and is suggestive of a tension headache resulting from dehydration. His orthostatic vital signs are positive in  the clinic. Because he has not symptoms of aura, photophobia, localized pain, it is unlikely that he has a migraine or cluster headache. Symptoms are most likely due to orthostatic hypotension precipitated by not only by hypoaldosteronism but furosemide and possibly tamsulosin. -plan to volume replete with fluid bolus -continue tylenol prn -hold furosemide and tamsulosin   4. Benign prostatic hypertrophy--Patient has symptoms of increased urinary frequency like due to furosemide  -due to orthostatic hypotension, consider holding tamsulosin dose -continue home finasteride  5. Left PICA ischemic stroke--Patient has negative neuro exam other than slight horizontal nystagmus. It is unlikely that this is due to CVA because symptoms are more related to orthostatic hypotension and volume depletion -continue home meds  6. Stage III CKD -trend BMP  7. Tobacco use -counsel patient on smoking cessation    This is a Careers information officer Note.  The care of the patient was discussed with Dr. Merrilyn Puma and the assessment and plan was formulated with their assistance.  Please see their note for official documentation of the patient encounter.   Signed: Wandra Mannan, Med Student 04/17/2016, 2:54 PM

## 2016-04-18 ENCOUNTER — Other Ambulatory Visit: Payer: Self-pay

## 2016-04-18 ENCOUNTER — Other Ambulatory Visit: Payer: Self-pay | Admitting: *Deleted

## 2016-04-18 ENCOUNTER — Telehealth: Payer: Self-pay | Admitting: Cardiology

## 2016-04-18 DIAGNOSIS — I129 Hypertensive chronic kidney disease with stage 1 through stage 4 chronic kidney disease, or unspecified chronic kidney disease: Secondary | ICD-10-CM | POA: Diagnosis not present

## 2016-04-18 DIAGNOSIS — F17210 Nicotine dependence, cigarettes, uncomplicated: Secondary | ICD-10-CM | POA: Diagnosis not present

## 2016-04-18 DIAGNOSIS — N183 Chronic kidney disease, stage 3 (moderate): Secondary | ICD-10-CM | POA: Diagnosis not present

## 2016-04-18 DIAGNOSIS — E875 Hyperkalemia: Secondary | ICD-10-CM

## 2016-04-18 DIAGNOSIS — Z8673 Personal history of transient ischemic attack (TIA), and cerebral infarction without residual deficits: Secondary | ICD-10-CM | POA: Diagnosis not present

## 2016-04-18 LAB — BASIC METABOLIC PANEL
Anion gap: 9 (ref 5–15)
BUN: 31 mg/dL — AB (ref 6–20)
CHLORIDE: 115 mmol/L — AB (ref 101–111)
CO2: 18 mmol/L — ABNORMAL LOW (ref 22–32)
Calcium: 8.4 mg/dL — ABNORMAL LOW (ref 8.9–10.3)
Creatinine, Ser: 1.44 mg/dL — ABNORMAL HIGH (ref 0.61–1.24)
GFR calc Af Amer: 53 mL/min — ABNORMAL LOW (ref >60)
GFR calc non Af Amer: 45 mL/min — ABNORMAL LOW (ref >60)
GLUCOSE: 86 mg/dL (ref 65–99)
POTASSIUM: 4.4 mmol/L (ref 3.5–5.1)
Sodium: 142 mmol/L (ref 135–145)

## 2016-04-18 MED ORDER — SODIUM POLYSTYRENE SULFONATE 15 GM/60ML PO SUSP: 15.0000 g | ORAL | Status: DC

## 2016-04-18 MED ORDER — ENOXAPARIN SODIUM 40 MG/0.4ML ~~LOC~~ SOLN
40.0000 mg | SUBCUTANEOUS | Status: DC
Start: 2016-04-18 — End: 2016-04-18

## 2016-04-18 NOTE — Consult Note (Signed)
   [facility] Inpatient Consult   04/18/2016  Derrick Hendrix 07/28/1941 [id number]   Patient is currently active with [facility] Care Management [up to [place]  for chronic disease management services.  Patient has been engaged by a Community education officer and resently  With [employer].  Met with the patient who states he has applied for Medicaid.  He states he is "willing to have a nurse work with him on his medications for home health and he consents to ongoing [facility] Management services." Patient states he gets his medications from [facility] that is boxed.  Consent form signed.  Our community based plan of care has focused on disease management and community resource support.  Patient will receive a post discharge transition of care call and will be evaluated for monthly home visits for assessments and disease process education.  Made Inpatient Case Manager aware that [place] Management following and the patient would like [facility] for home health.  Of note, [facility] Care Management services does not replace or interfere with any services that are needed or arranged by inpatient case management or social work.  For additional questions or referrals please contact:  Natividad Brood, RN BSN [facility] Liaison  [phone] business mobile phone Toll free office [phone]

## 2016-04-18 NOTE — Progress Notes (Signed)
Subjective: Derrick Hendrix had no acute events overnight. He says that he is feeling well and has no complaints of headache.   Objective: Vital signs in last 24 hours: Filed Vitals:   04/17/16 1249 04/17/16 2009 04/18/16 0501  BP: 152/82 175/88 137/73  Pulse: 78 85 76  Temp: 97.8 F (36.6 C) 97.8 F (36.6 C) 98.1 F (36.7 C)  TempSrc: Oral Oral Oral  Resp:  18 16  Height: 6\' 2"  (1.88 m)    Weight: 83.915 kg (185 lb)  83.643 kg (184 lb 6.4 oz)  SpO2: 98% 95% 98%   Weight change:   Intake/Output Summary (Last 24 hours) at 04/18/16 1157 Last data filed at 04/18/16 0900  Gross per 24 hour  Intake    483 ml  Output    400 ml  Net     83 ml   BP 137/73 mmHg  Pulse 76  Temp(Src) 98.1 F (36.7 C) (Oral)  Resp 16  Ht 6\' 2"  (1.88 m)  Wt 83.643 kg (184 lb 6.4 oz)  BMI 23.67 kg/m2  SpO2 98% General appearance: alert, cooperative and no distress Lungs: clear to auscultation bilaterally Heart: regular rate and rhythm, S1, S2 normal, no murmur, click, rub or gallop   Lab Results: Basic Metabolic Panel:  Last Labs      Recent Labs Lab 04/17/16 2050 04/18/16 0354  NA 142 142  K 4.7 4.4  CL 115* 115*  CO2 18* 18*  GLUCOSE 121* 86  BUN 31* 31*  CREATININE 1.53* 1.44*  CALCIUM 8.8* 8.4*     CBC:  Last Labs      Recent Labs Lab 04/17/16 1438  WBC 5.6  HGB 11.9*  HCT 38.3*  MCV 95.0  PLT 278          Micro Results: No results found for this or any previous visit (from the past 240 hour(s)). Studies/Results: No results found. Medications: I have reviewed the patient's current medications. Scheduled Meds: . atorvastatin  80 mg Oral Daily  . clopidogrel  75 mg Oral Daily  . enoxaparin (LOVENOX) injection  40 mg Subcutaneous Q24H  . finasteride  5 mg Oral Daily  . fludrocortisone  0.2 mg Oral Daily  . sodium chloride flush  3 mL Intravenous Q12H  . sodium chloride flush  3 mL Intravenous Q12H   Continuous Infusions:    PRN Meds:.acetaminophen, nitroGLYCERIN  Assessment/Plan: Derrick Hendrix is a 75 year old man who is status post left PICA ischemic stroke in March with a history of hyporeninemic, hypoaldosteronism complicated by hyperkalemia and orthostatic hypotension, benign prostatic hypertrophy with bladder outlet obstruction symptoms, stage III CKD, and tobacco use who is found to have peaked T waves on EKG.  1. Hyperkalemia due to hyporeninemic hypoaldosteronism -- Patient has history of multiple episodes of hyperkalemia with EKG changes in the past. Patient is asymptomatic but clinic BMP showed hyperkalemia and EKG showed peaked T waves. Patient has orthostatic changes in sitting/standing BP measurement, as well as associated dizziness and headache with standing. Most recent K+ 4.4 -Discontinue furosemide, continue fludrocortisone -Start patiromer following discharge  2. Headache/orthostatic hypotension--Patient's symptoms worsen with standing and alleviated with rest and is suggestive of a tension headache resulting from dehydration. His orthostatic vital signs are positive in the clinic. Because he has not symptoms of aura, photophobia, localized pain, it is unlikely that he has a migraine or cluster headache. Symptoms are most likely due to orthostatic hypotension precipitated by not only by hypoaldosteronism but furosemide and possibly  tamsulosin. -Was volume repleted with fluid bolus -Continue tylenol prn -Hold furosemide and tamsulosin  This is a Careers information officer Note.  The care of the patient was discussed with Dr. Merrilyn Puma and the assessment and plan formulated with their assistance.  Please see their attached note for official documentation of the daily encounter.    Wandra Mannan, Med Student 04/18/2016, 11:57 AM

## 2016-04-18 NOTE — Telephone Encounter (Signed)
LMOVM requesting that pt send manual transmission b/c home monitor has not updated in at least 14 days.    

## 2016-04-18 NOTE — Care Management Obs Status (Signed)
[place] NOTIFICATION   Patient Details  Name: Derrick Hendrix MRN: [id number] Date of Birth: [birth year]   Medicare Observation Status Notification Given:  Yes (Hyperkalemia. )    Bethena Roys, RN 04/18/2016, 12:03 PM

## 2016-04-18 NOTE — Patient Outreach (Signed)
[facility]) Care Management  04/18/2016  Abdul Shiver 1941/05/29 [id number]   Transition of care RN attempted to reach pt post hospital discharge however unsuccessful and unable to leave a message to request a call back. RN will continue outreach call accordingly next week to inquire further on pt's needs.  Raina Mina, RN Care Management Coordinator [place] Office [phone]

## 2016-04-18 NOTE — Care Management Note (Signed)
Case Management Note  Patient Details  Name: Derrick Hendrix MRN: [id number] Date of Birth: June 25, 1941  Subjective/Objective: Pt in for Hyperkalemia. Pt is from home alone. Pt has a sister that checks in on him periodically. He has transportation to appointments via [facility]. Pt did verbalize issues with paying for transportation.                    Action/Plan: CM was able to reach out to [place] in ref to a community nurse that [facility] will offer / SW as well. CM did speak with pt in regards to benefit of having a HHRN for medication and disease management. Pt is agreeable to services. Agency List provided for choice and pt chose [facility] for Services. CM did make referral and SOC to begin within 24-48 hours post d/c. No further needs from CM at this time. Sister to provide transportation home.   Expected Discharge Date:                  Expected Discharge Plan:  [place]  In-House Referral:  NA  Discharge planning Services  CM Consult  Post Acute Care Choice:  Home Health Choice offered to:  Patient  DME Arranged:  N/A DME Agency:  NA  HH Arranged:  RN [place] Agency:  [place]  Status of Service:  Completed, signed off  Medicare Important Message Given:    Date Medicare IM Given:    Medicare IM give by:    Date Additional Medicare IM Given:    Additional Medicare Important Message give by:     If discussed at [place] of Stay Meetings, dates discussed:    Additional Comments:  Bethena Roys, RN 04/18/2016, 3:21 PM

## 2016-04-18 NOTE — Progress Notes (Signed)
   Subjective: Derrick Hendrix had NAEON. Feels well this morning, no complaints. Wants to go home.  Objective: Vital signs in last 24 hours: Filed Vitals:   04/17/16 1249 04/17/16 2009 04/18/16 0501  BP: 152/82 175/88 137/73  Pulse: 78 85 76  Temp: 97.8 F (36.6 C) 97.8 F (36.6 C) 98.1 F (36.7 C)  TempSrc: Oral Oral Oral  Resp:  18 16  Height: 6\' 2"  (1.88 m)    Weight: 185 lb (83.915 kg)  184 lb 6.4 oz (83.643 kg)  SpO2: 98% 95% 98%   Weight change:   Intake/Output Summary (Last 24 hours) at 04/18/16 1007 Last data filed at 04/18/16 0900  Gross per 24 hour  Intake    483 ml  Output    400 ml  Net     83 ml     Gen: Well-appearing, alert and oriented to person, place, and time HEENT: Oropharynx clear without erythema or exudate. Maxillary teethedentulous, poor dentition in mandibular teeth throughout with no apparent signs of abscess or infection. Neck: No cervical LAD, no thyromegaly or nodules, no JVD noted. CV: Normal rate, regular rhythm, no murmurs, rubs, or gallops Pulmonary: Normal effort, CTA bilaterally, no crackles or wheezes Abdominal: Soft, non-tender, non-distended, without rebound, guarding, or masses Extremities: Distal pulses 2+ in upper and lower extremities bilaterally, no tenderness, erythema or edema Neuro: CN II-XII grossly intact, no focal weakness or sensory deficits noted. Patient does have 1-2 beats of horizontal nystagmus with right lateral gaze. No tremors. No finger-to-nose dysmetria, no dysdiadochokinesia. Skin: No atypical appearing moles. No rashes  Lab Results: Basic Metabolic Panel:  Recent Labs Lab 04/17/16 2050 04/18/16 0354  NA 142 142  K 4.7 4.4  CL 115* 115*  CO2 18* 18*  GLUCOSE 121* 86  BUN 31* 31*  CREATININE 1.53* 1.44*  CALCIUM 8.8* 8.4*   CBC:  Recent Labs Lab 04/17/16 1438  WBC 5.6  HGB 11.9*  HCT 38.3*  MCV 95.0  PLT 278   Assessment/Plan: 1. Hyperkalemia secondary to hyporeninemic hypoaldosteronism -  Patient has to history of several hospitalizations for hyperkalemia with EKG changes due to his underlying hyporeninemic hypoaldosteronism. Patient is currently asymptomatic, but was found to have K of 6.5 with EKG changes in clinic today. Was given Kayexalate and albuterol in clinic. Did receive additional Kayexalate and now with K of 4.4. Resolved. Repeat EKG this AM shows resolution of acute T-wave changes and is similar to his old EKGs. -Continue home fludrocortisone; hold furosemide -D/c furosemide and start Patiromer on discharge  2. Orthostatic hypotension - patient with headache and dizziness consistent with a tension headache as well as orthostatic hypotension confirmed in clinic today. Patient is on both tamsulosin and furosemide, which are the likely culprits for his orthostatics. His symptoms are not suggestive of a migraine or tension headache, and he has no signs of a dental infection, so his headache is most likely due to this. -IV bolus x 1 yesterday -Acetaminophen when necessary for headache -Hold furosemide, tamsulosin  Dispo: Anticipated discharge today.  The patient does have a current PCP Derrick Salina, MD) and does need an [facility] hospital follow-up appointment after discharge.    Norval Gable, MD 04/18/2016, 10:07 AM

## 2016-04-20 DIAGNOSIS — E875 Hyperkalemia: Secondary | ICD-10-CM | POA: Diagnosis not present

## 2016-04-20 DIAGNOSIS — E2740 Unspecified adrenocortical insufficiency: Secondary | ICD-10-CM | POA: Diagnosis not present

## 2016-04-20 DIAGNOSIS — Z720 Tobacco use: Secondary | ICD-10-CM | POA: Diagnosis not present

## 2016-04-20 DIAGNOSIS — I951 Orthostatic hypotension: Secondary | ICD-10-CM | POA: Diagnosis not present

## 2016-04-20 DIAGNOSIS — R51 Headache: Secondary | ICD-10-CM | POA: Diagnosis not present

## 2016-04-20 DIAGNOSIS — I739 Peripheral vascular disease, unspecified: Secondary | ICD-10-CM | POA: Diagnosis not present

## 2016-04-20 DIAGNOSIS — N183 Chronic kidney disease, stage 3 (moderate): Secondary | ICD-10-CM | POA: Diagnosis not present

## 2016-04-20 DIAGNOSIS — I129 Hypertensive chronic kidney disease with stage 1 through stage 4 chronic kidney disease, or unspecified chronic kidney disease: Secondary | ICD-10-CM | POA: Diagnosis not present

## 2016-04-20 DIAGNOSIS — Z8673 Personal history of transient ischemic attack (TIA), and cerebral infarction without residual deficits: Secondary | ICD-10-CM | POA: Diagnosis not present

## 2016-04-20 NOTE — Discharge Summary (Signed)
Name: Derrick Hendrix MRN: [id number] DOB: Nov 21, 1941 75 y.o. PCP: [name], MD  Date of Admission: 04/17/2016 12:47 PM Date of Discharge: 04/20/2016 Attending Physician: No att. providers found  Discharge Diagnosis: 1. Hyperkalemia  Principal Problem:   Hyperkalemia Active Problems:   Headache   Hyporeninemic hypoaldosteronism (HCC)   Essential hypertension   Orthostatic hypotension   BPH (benign prostatic hypertrophy) with urinary obstruction  Discharge Medications:   Medication List    STOP taking these medications        patiromer 8.4 g packet  Commonly known as:  VELTASSA      TAKE these medications        acetaminophen 500 MG tablet  Commonly known as:  TYLENOL  Take 2 tablets (1,000 mg total) by mouth every 6 (six) hours as needed for mild pain (or Fever >/= 101).     atorvastatin 80 MG tablet  Commonly known as:  LIPITOR  Take 1 tablet (80 mg total) by mouth daily.     clopidogrel 75 MG tablet  Commonly known as:  PLAVIX  Take 1 tablet (75 mg total) by mouth daily.     finasteride 5 MG tablet  Commonly known as:  PROSCAR  Take 1 tablet (5 mg total) by mouth daily.     fludrocortisone 0.1 MG tablet  Commonly known as:  FLORINEF  Take 2 tablets (0.2 mg total) by mouth daily in the morning.     ipratropium 0.06 % nasal spray  Commonly known as:  ATROVENT  Place 2 sprays into the nose 3 (three) times daily.     nitroGLYCERIN 0.4 MG SL tablet  Commonly known as:  NITROSTAT  Place 1 tablet (0.4 mg total) under the tongue every 5 (five) minutes x 3 doses as needed for chest pain.     polyethylene glycol packet  Commonly known as:  MIRALAX  Take 17 g by mouth daily as needed (constipation).     sodium polystyrene 15 GM/60ML suspension  Commonly known as:  KAYEXALATE  Take 60 mLs (15 g total) by mouth every other day.     tamsulosin 0.4 MG Caps capsule  Commonly known as:  FLOMAX  Take 1 capsule (0.4 mg total) by mouth daily after supper.        Disposition and follow-up:   DerrickDerrick Hendrix was discharged from [facility] in Good condition.  At the hospital follow up visit please address:  1. - Is patient tolerating Kayexalate well?  - Are his headaches/dizziness improved now off furosemide (although still on tamsulosin for BPH symptoms)? - If K+ high in clinic, titrate up Kayexalate (on very low dose currently). - Has home health RN come to his house and helped with medications?  2.  Labs / imaging needed at time of follow-up: BMP (K+ recheck)  3.  Pending labs/ test needing follow-up: None  Follow-up Appointments:     Follow-up Information    Follow up with [facility].   Why:  Registered Nurse.    Contact information:   [place] [address] [phone]       Discharge Instructions: Discharge Instructions    AMB Referral to [place] Management    Complete by:  As directed   Reason for consult:  Ongoing home monitoring  Expected date of contact:  1-3 days (reserved for hospital discharges)  Please assign patient to social worker for post hospital follow up patient expressing many social needs with  ability to pay for transportation.    He has recently been assigned a Designer, television/film set. Nurse to provide transition of care calls and assess for home visits for new medications. Questions please call:  Natividad Brood, RN BSN [place] [facility] Liaison  [phone] business mobile phone Toll free office [phone]    Natividad Brood, RN BSN [place] Liaison  [phone] business mobile phone Toll free office [phone]     Diet - low sodium heart healthy    Complete by:  As directed      Discharge instructions    Complete by:  As directed   Derrick Hendrix,  Stop taking the furosemide. Do not fill the patiromer prescription. It is VERY expensive. Instead, start taking Kayexalate once every other day starting  tomorrow. That will help your potassium. It's the same stuff we've been giving you here and has worked well.  Come to the clinic next Wednesday, 04/23/16 at 10:45am to re-check your potassium to see if it's still normal like it is now.  Thanks! Blane Ohara     Increase activity slowly    Complete by:  As directed            Consultations:  None  Procedures Performed:  No results found.  2D Echo: None  Cardiac Cath: None  Admission HPI: [name] is a 75 year old 50py-smoker with hyporeninemic hypoaldosteronism, BPH, L PICA CVA in 3/17, and CKD stage III who presents from clinic with hyperkalemia as well as headache and dizziness. The presented to our clinic this morning for a follow-up for his episodes of headache as well as following his hyperkalemia. He is said that since his discharge on 4/22 he has continued to have persistent left-sided headaches associated with left-sided blurry vision. He describes the headache as aching, confined to the left side of his face, occasionally radiating down to his neck and to just past his ear. The headache is particularly worse after he takes the furosemide. It is also worse right when he sits up or lies down. When he does sit up or lie down, he also has associated lightheadedness that resolves with standing still and holding onto something for a few seconds. Resting and taking Tylenol do improve the headache. He describes no difficulty in ambulation or any recent falls, no auras, nausea, or vomiting, Excessive tearing, focal numbness or weakness, sensation that the room is spinning, syncope, chest pain, shortness of breath, palpitations or any other symptoms at this time.  He presented to the clinic for follow-up of these symptoms today, and on recheck of his potassium was noted to be elevated at 6.5. Orthostatic vitals were positive as well. An EKG showed worsening of his chronic T-wave changes. He was given albuterol as well as Kayexalate in the  clinic and our team was asked to admit the patient for further management of his hyperkalemia. He has not yet had a bowel movement.   Hospital Course by problem list: Principal Problem:   Hyperkalemia Active Problems:   Headache   Hyporeninemic hypoaldosteronism (HCC)   Essential hypertension   Orthostatic hypotension   BPH (benign prostatic hypertrophy) with urinary obstruction   1. Hyperkalemia secondary to hyporeninemic hypoaldosteronism -  Patient was asymptomatic, but was found to have K of 6.5 with EKG changes in clinic . Was given Kayexalate and albuterol in clinic. The patient was given calcium gluconate and an additional dose Kayexalate and with repeat K of 4.4. A repeat EKG the next AM showed resolution  of acute T-wave changes and is similar to his old EKGs. His daily furosemide for hyperkalemia was discontinued and he was stared on Kayexalate 15mg  every other day. Home health RN was ordered to assist with medications, as his pharmacy puts them in blister packs and confuses him.  Discharge Vitals:   BP 137/73 mmHg  Pulse 76  Temp(Src) 98.3 F (36.8 C) (Oral)  Resp 16  Ht 6\' 2"  (1.88 m)  Wt 184 lb 6.4 oz (83.643 kg)  BMI 23.67 kg/m2  SpO2 100%  Discharge Labs:  No results found for this or any previous visit (from the past 24 hour(s)).  Signed: Norval Gable, MD 04/20/2016, 1:33 PM    Services Ordered on Discharge: [facility] Equipment Ordered on Discharge: None

## 2016-04-21 ENCOUNTER — Other Ambulatory Visit: Payer: Self-pay

## 2016-04-21 MED ORDER — CLOPIDOGREL BISULFATE 75 MG PO TABS: 75.0000 mg | ORAL_TABLET | Freq: Every day | ORAL | Status: DC

## 2016-04-21 NOTE — Telephone Encounter (Signed)
Laquane RN with [facility] called requesting refill on plavix.  Sent to PCP  Also requesting VO to continue skilled nursing visits for medication management and referral to SW.  She reports [employer] is having trouble keeping med box filled for patient due to frequent changes in his med regimen.    IS VO ok?

## 2016-04-21 NOTE — Telephone Encounter (Signed)
I think that is a great idea. His meds are being changed frequently due to his symptoms and electrolyte abnormalities. Will stating I agree with continued skilled nursing for medication management here be adequate or should I place an order? I have discussed Mr. Gelinas with Edwena Blow in regards to his transportation limitations but formal consultation would also be of benefit. Thanks.

## 2016-04-22 ENCOUNTER — Encounter: Payer: Self-pay | Admitting: Licensed Clinical Social Worker

## 2016-04-22 ENCOUNTER — Other Ambulatory Visit: Payer: Self-pay | Admitting: *Deleted

## 2016-04-22 NOTE — Patient Outreach (Signed)
[facility]) Care Management  04/22/2016  Naiem Everist 1941-02-01 [id number]  Transition of care   RN attempted outreach call to pt's sister Venita Lick) as requested for post-op hospital discharge however unsuccessful. RN able to leave a HIPAA approved voice message requesting a call back. Will further address pt's needs at that time for possible [facility] services.   Raina Mina, RN Care Management Coordinator [place] Office [phone]

## 2016-04-22 NOTE — Progress Notes (Signed)
Patient ID: Derrick Hendrix, male   DOB: 29-Oct-1941, 75 y.o.   MRN: [id number]  [name] left envelope requesting this worker to assist with completion of paperwork.  [facility] provided [name] with a SCAT application.  However, pt was unable to complete the application and requested assistance with the completion of the form.  CSW will attempt to assist [name] and will need patient to review and sign forms as appropriate.  Pt's next scheduled [facility] appointment is 04/23/16 during [place] work hours.  CSW will f/u with [name] at that time.

## 2016-04-23 ENCOUNTER — Encounter: Payer: Self-pay | Admitting: Licensed Clinical Social Worker

## 2016-04-23 ENCOUNTER — Other Ambulatory Visit: Payer: Self-pay | Admitting: *Deleted

## 2016-04-23 ENCOUNTER — Ambulatory Visit (INDEPENDENT_AMBULATORY_CARE_PROVIDER_SITE_OTHER): Payer: Medicare Other | Admitting: Internal Medicine

## 2016-04-23 ENCOUNTER — Encounter: Payer: Self-pay | Admitting: Internal Medicine

## 2016-04-23 VITALS — BP 160/83 | HR 70 | Temp 98.3°F | Ht 74.0 in | Wt 189.4 lb

## 2016-04-23 DIAGNOSIS — I951 Orthostatic hypotension: Secondary | ICD-10-CM

## 2016-04-23 DIAGNOSIS — F17200 Nicotine dependence, unspecified, uncomplicated: Secondary | ICD-10-CM | POA: Diagnosis not present

## 2016-04-23 DIAGNOSIS — R51 Headache: Secondary | ICD-10-CM

## 2016-04-23 DIAGNOSIS — R519 Headache, unspecified: Secondary | ICD-10-CM

## 2016-04-23 DIAGNOSIS — E2749 Other adrenocortical insufficiency: Secondary | ICD-10-CM

## 2016-04-23 LAB — BASIC METABOLIC PANEL
ANION GAP: 11 (ref 5–15)
BUN: 29 mg/dL — ABNORMAL HIGH (ref 6–20)
CALCIUM: 9 mg/dL (ref 8.9–10.3)
CO2: 19 mmol/L — ABNORMAL LOW (ref 22–32)
Chloride: 111 mmol/L (ref 101–111)
Creatinine, Ser: 1.4 mg/dL — ABNORMAL HIGH (ref 0.61–1.24)
GFR calc Af Amer: 54 mL/min — ABNORMAL LOW (ref >60)
GFR, EST NON AFRICAN AMERICAN: 47 mL/min — AB (ref >60)
Glucose, Bld: 183 mg/dL — ABNORMAL HIGH (ref 65–99)
POTASSIUM: 4.9 mmol/L (ref 3.5–5.1)
SODIUM: 141 mmol/L (ref 135–145)

## 2016-04-23 NOTE — Progress Notes (Signed)
CSW met with Derrick Hendrix to discuss SCAT application pt left for this worker.  Derrick Hendrix states he is in need of a SCAT punch card.  Pt had received a punch card from [facility] some time ago and has used all but two-three rides.  Derrick Hendrix concerned that he will not have the funds available for transportation for his upcoming appointments.  CSW placed call to SCAT to inquire about pt's current eligibility.  Derrick Hendrix is eligible for SCAT until 11/2017 and does not need a new application completed at this time.  Pt in need of punch card only.  CSW has notified [place] Manager/CSW.  CSW also informed Derrick Hendrix, [facility] has attempted to contact him and left messages post discharge.  Pt was unaware.   CSW provided Derrick Hendrix main contact number to [facility] as pt also voiced concern regarding medications post discharge.  CSW encouraged Derrick Hendrix to discuss medications questions with Dr. Arcelia Jew today.

## 2016-04-23 NOTE — Patient Outreach (Signed)
[place] [facility]) Care Management  04/23/2016  Derrick Hendrix 12-16-1940 [id number]   Transition of care  RN received a call today from [facility]- CMA) indicating pt has called the office and requesting RN to contact him on his cell ([phone]). RN attempted to contact the pt however unsuccessful. RN able to leave a message requesting a call back. Will intervene at that time on pt's needs via [facility] services and attempt to completed the transition of care template based upon pt's recent discharge from the hospital.  Raina Mina, RN Care Management Coordinator [place] Office [phone]

## 2016-04-23 NOTE — Progress Notes (Signed)
CSW met with Derrick Hendrix following his [facility] appointment.  Pt was able to meet with [facility] pharmacist, question answered regarding medication.  CSW assisted Derrick Hendrix with contacting [facility] RN, L. Zigmund Daniel, message left.  Pt had difficulty utilizing phone tree but thinks he may have a direct dial number for Derrick Hendrix at home.  [facility] SW has approved [name] for a SCAT punch pass if compliant with [facility] RN.  Pt in need of listing of upcoming appointments and addresses.  CSW provided [name] with [facility] calendar of appointments and address for his 5/23 appointment.  Pt denies add'l social work needs at this time.

## 2016-04-23 NOTE — Progress Notes (Signed)
   Subjective:    Patient ID: Derrick Hendrix, male    DOB: 10-31-1941, 75 y.o.   MRN: [id number]  HPI Mr. Arcidiacono is a 75yo man with PMHx of hyporeninemic hypoaldosteronism, CVA, tobacco abuse, and CKD stage 3 who presents today for a hospital follow up for his headaches, hyperkalemia, and dizziness.  Headaches: Reports he is still getting headaches occasionally. He describes them as left-sided and aching. He denies associated blurry vision. He states his last headache was this morning. He notes if he "does what I'm supposed to do" the headaches do not occur. This includes taking his medications correctly and eating/drinking at mealtimes.   Hyperkalemia 2/2 Hyporeninemic Hypoaldosteronism: His K was 6.5 with worsening T wave changes in clinic prior to be admitted to the hospital. His K upon discharge on 5/5 was 4.4. He was discharged on Kayexalate 15 g every other day. He reports he had difficulty getting Kayexalate after discharge due to the medication delivery company he was using. He states he just got the medication 2 days ago. He has not been taking Kayexalate since he received smaller bottles than he expected. He states the directions on the bottle say to take 1 oz but he does not know how much this is or how to measure it. He reports he has been taking Miralax and doesn't understand why he needs to take Kayexalate. He denies any palpitations, chest pain, SOB, or muscle weakness.   Dizziness: He was noted to be orthostatic while in the hospital. He reports he is still feeling dizzy. He states he went for his regular morning walk today and got about halfway when he suddenly became very dizzy and had to "stumble home." He reports he did not eat or drink anything before going for his walk. He states he has stopped his Lasix. He was given some graham crackers and coffee in the clinic and he notes he is feeling better now.    Review of Systems General: Denies fever, chills, night sweats, changes in  weight, changes in appetite HEENT: Denies ear pain, rhinorrhea, sore throat CV: Denies orthopnea Pulm: Denies cough, wheezing GI: Denies abdominal pain, nausea, vomiting, diarrhea, constipation, melena, hematochezia GU: Denies dysuria, hematuria, frequency Msk: Denies joint pains Neuro: Denies numbness, tingling Skin: Denies rashes, bruising Psych: Denies depression, anxiety, hallucinations    Objective:   Physical Exam Orthostatics Laying: 167/74, HR 71 Sitting: 154/86, HR 79 Standing: 169/87, HR 79   General: alert, sitting up, NAD HEENT: [place]/AT, EOMI, sclera anicteric, mucus membranes moist CV: RRR, no m/g/r Pulm: CTA bilaterally, breaths non-labored  Abd: BS+, soft, non-tender Ext: warm, no peripheral edema Neuro: alert and oriented x 3    Assessment & Plan:  Please refer to A&P documentation.

## 2016-04-23 NOTE — Assessment & Plan Note (Addendum)
Despite him not taking Kayexalate since his hospitalization, his potassium is stable at 4.9 today. I had pharmacy discuss how to take Kayexalate and they gave him some 1 oz medication cups to use for his dosing. He understands that he is to take Kayexalate 15 g (1 oz) once every other day. We discussed that taking Miralax alone will not help keep his potassium down. Will continue his Fludrocortisone 0.2 mg daily. Will have him return in 3 weeks to check his potassium after starting the kayexalate and see how his orthostatics are doing.

## 2016-04-23 NOTE — Patient Instructions (Signed)
General Instructions: - Please take Kayexalate 15 g every other day. I know you are having bowel movements with Miralax but the Kayexalate helps to bind the potassium to keep your levels normal. - Please eat and drink before going on your daily walk. If you start to feel dizzy, stop and rest.   Please try to bring all your medicines next time. This will help [place] keep you safe from mistakes.   Progress Toward Treatment Goals:  Treatment Goal 02/06/2015  Blood pressure at goal  Stop smoking smoking the same amount    Self Care Goals & Plans:  Self Care Goal 04/23/2016  Manage my medications take my medicines as prescribed; bring my medications to every visit; refill my medications on time  Monitor my health -  Eat healthy foods eat more vegetables; eat foods that are low in salt; eat baked foods instead of fried foods  Be physically active find an activity I enjoy  Stop smoking -  Meeting treatment goals -    No flowsheet data found.   Care Management & Community Referrals:  No flowsheet data found.

## 2016-04-23 NOTE — Telephone Encounter (Signed)
i gave a verbal- they will send something to sign that will be in your box

## 2016-04-23 NOTE — Assessment & Plan Note (Signed)
He is still having occasional headaches that seem to occur in conjunction with his dizziness. These are most likely related to his orthostatic hypotension. We discussed that he should eat/drink regularly and take his medications as prescribed. He states his headaches are controlled when he does this. Continue to monitor.

## 2016-04-23 NOTE — Progress Notes (Signed)
Internal Medicine Clinic Attending  Case discussed with Dr. Rivet at the time of the visit.  We reviewed the resident's history and exam and pertinent patient test results.  I agree with the assessment, diagnosis, and plan of care documented in the resident's note.  

## 2016-04-23 NOTE — Assessment & Plan Note (Signed)
He was not orthostatic on exam today but this was after he drank some fluids. His orthostatic hypotension is driven by his underlying hypoaldosteronism. We discussed that it is important for him to eat/drink before he goes for his daily walk and that he should not venture too far from home. He has agreed to eat/drink more regularly in the morning.

## 2016-04-24 DIAGNOSIS — I739 Peripheral vascular disease, unspecified: Secondary | ICD-10-CM | POA: Diagnosis not present

## 2016-04-24 DIAGNOSIS — I951 Orthostatic hypotension: Secondary | ICD-10-CM | POA: Diagnosis not present

## 2016-04-24 DIAGNOSIS — E2740 Unspecified adrenocortical insufficiency: Secondary | ICD-10-CM | POA: Diagnosis not present

## 2016-04-24 DIAGNOSIS — E875 Hyperkalemia: Secondary | ICD-10-CM | POA: Diagnosis not present

## 2016-04-24 DIAGNOSIS — I129 Hypertensive chronic kidney disease with stage 1 through stage 4 chronic kidney disease, or unspecified chronic kidney disease: Secondary | ICD-10-CM | POA: Diagnosis not present

## 2016-04-24 DIAGNOSIS — N183 Chronic kidney disease, stage 3 (moderate): Secondary | ICD-10-CM | POA: Diagnosis not present

## 2016-04-25 ENCOUNTER — Telehealth: Payer: Self-pay

## 2016-04-25 ENCOUNTER — Encounter: Payer: Self-pay | Admitting: Cardiology

## 2016-04-25 ENCOUNTER — Other Ambulatory Visit: Payer: Self-pay | Admitting: *Deleted

## 2016-04-25 DIAGNOSIS — N183 Chronic kidney disease, stage 3 (moderate): Secondary | ICD-10-CM | POA: Diagnosis not present

## 2016-04-25 DIAGNOSIS — E2740 Unspecified adrenocortical insufficiency: Secondary | ICD-10-CM | POA: Diagnosis not present

## 2016-04-25 DIAGNOSIS — I739 Peripheral vascular disease, unspecified: Secondary | ICD-10-CM | POA: Diagnosis not present

## 2016-04-25 DIAGNOSIS — I951 Orthostatic hypotension: Secondary | ICD-10-CM | POA: Diagnosis not present

## 2016-04-25 DIAGNOSIS — E875 Hyperkalemia: Secondary | ICD-10-CM | POA: Diagnosis not present

## 2016-04-25 DIAGNOSIS — I129 Hypertensive chronic kidney disease with stage 1 through stage 4 chronic kidney disease, or unspecified chronic kidney disease: Secondary | ICD-10-CM | POA: Diagnosis not present

## 2016-04-25 NOTE — Telephone Encounter (Signed)
Pt scheduled 5/31 0915 with Dr. Marijean Bravo.  Unable to get here any sooner due to no money for the bus.  I have spoken with [facility] RN and she will go back to patients home to remove carvedilol from pill planner.

## 2016-04-25 NOTE — Telephone Encounter (Signed)
Colletta Maryland, RN with [facility] called from patients home for clarification on medications patient should have in home.  Patients BP has been elevated, 160/100 in home right now.  Although carvedilol, HCTZ, cardura, and HCTZ were discontinued in  April, Stephanie reports patient still taking carvedilol 3.125mg  daily.  Colletta Maryland wanting to confirm that patient not needing any additional antihypertensives for better BP control.  Recent office BP 160/83  Please advise.

## 2016-04-25 NOTE — Telephone Encounter (Signed)
Patient should not be on any BP medications. Not sure where the Carvedilol came from? Looks like this was stopped in April. I told him to tell his home health nurse to stop all BP meds. Will have him come to clinic in next 2-3 weeks. Can we schedule appointment? He was having low BPs in the hospital.

## 2016-04-26 DIAGNOSIS — I951 Orthostatic hypotension: Secondary | ICD-10-CM | POA: Diagnosis not present

## 2016-04-26 DIAGNOSIS — E2740 Unspecified adrenocortical insufficiency: Secondary | ICD-10-CM | POA: Diagnosis not present

## 2016-04-26 DIAGNOSIS — I739 Peripheral vascular disease, unspecified: Secondary | ICD-10-CM | POA: Diagnosis not present

## 2016-04-26 DIAGNOSIS — E875 Hyperkalemia: Secondary | ICD-10-CM | POA: Diagnosis not present

## 2016-04-26 DIAGNOSIS — I129 Hypertensive chronic kidney disease with stage 1 through stage 4 chronic kidney disease, or unspecified chronic kidney disease: Secondary | ICD-10-CM | POA: Diagnosis not present

## 2016-04-26 DIAGNOSIS — N183 Chronic kidney disease, stage 3 (moderate): Secondary | ICD-10-CM | POA: Diagnosis not present

## 2016-04-28 ENCOUNTER — Encounter: Payer: Self-pay | Admitting: *Deleted

## 2016-04-28 DIAGNOSIS — E2740 Unspecified adrenocortical insufficiency: Secondary | ICD-10-CM | POA: Diagnosis not present

## 2016-04-28 DIAGNOSIS — E875 Hyperkalemia: Secondary | ICD-10-CM | POA: Diagnosis not present

## 2016-04-28 DIAGNOSIS — N183 Chronic kidney disease, stage 3 (moderate): Secondary | ICD-10-CM | POA: Diagnosis not present

## 2016-04-28 DIAGNOSIS — I739 Peripheral vascular disease, unspecified: Secondary | ICD-10-CM | POA: Diagnosis not present

## 2016-04-28 DIAGNOSIS — I951 Orthostatic hypotension: Secondary | ICD-10-CM | POA: Diagnosis not present

## 2016-04-28 DIAGNOSIS — I129 Hypertensive chronic kidney disease with stage 1 through stage 4 chronic kidney disease, or unspecified chronic kidney disease: Secondary | ICD-10-CM | POA: Diagnosis not present

## 2016-04-28 NOTE — Patient Outreach (Signed)
[facility]) Care Management  04/28/2016  Derrick Hendrix Jun 02, 1941 [id number]   Transition of care  RN spoke with pt and introduced the [facility] program and services. Pt indicates he is doing well however running out of "punch card" for transportation to his provider appointments and was requesting additional tickets. RN inquired on the SCATs services that was setup for his with [facility] earlier last year and pt indicated he did not have the funds for the transportation fees. RN has requested Nat Christen Oncologist) to assist with providing pt additional tickets to pt's provider's office and involved Golden Hurter to getting these tickets to this pt for future transportation services. RN attempted to inquired on pt's overall health and offered community home visit however pt states he is doing well and declined. RN also offered telephone health coach if he felt further resources were needed in the community. Pt opt to decline all [facility] services even ongoing transition of care contact and just needed the transportation tickets. RN inquired on how was he going to continue with obtaining transportation since this was just to accommodate him at this time and not routinely provided. No further transition of care or contact will be made at this time via this RN case manager. Will update involved Education officer, museum.  Raina Mina, RN Care Management Coordinator [place] Office [phone]

## 2016-05-02 ENCOUNTER — Encounter: Payer: Self-pay | Admitting: Internal Medicine

## 2016-05-02 DIAGNOSIS — I951 Orthostatic hypotension: Secondary | ICD-10-CM | POA: Diagnosis not present

## 2016-05-02 DIAGNOSIS — I129 Hypertensive chronic kidney disease with stage 1 through stage 4 chronic kidney disease, or unspecified chronic kidney disease: Secondary | ICD-10-CM | POA: Diagnosis not present

## 2016-05-02 DIAGNOSIS — E875 Hyperkalemia: Secondary | ICD-10-CM | POA: Diagnosis not present

## 2016-05-02 DIAGNOSIS — N183 Chronic kidney disease, stage 3 (moderate): Secondary | ICD-10-CM | POA: Diagnosis not present

## 2016-05-02 DIAGNOSIS — E2740 Unspecified adrenocortical insufficiency: Secondary | ICD-10-CM | POA: Diagnosis not present

## 2016-05-02 DIAGNOSIS — I739 Peripheral vascular disease, unspecified: Secondary | ICD-10-CM | POA: Diagnosis not present

## 2016-05-05 ENCOUNTER — Ambulatory Visit: Payer: Medicare Other | Admitting: *Deleted

## 2016-05-05 DIAGNOSIS — I639 Cerebral infarction, unspecified: Secondary | ICD-10-CM | POA: Diagnosis not present

## 2016-05-05 NOTE — Progress Notes (Signed)
Carelink Summary Report / Loop Recorder 

## 2016-05-06 ENCOUNTER — Ambulatory Visit (INDEPENDENT_AMBULATORY_CARE_PROVIDER_SITE_OTHER): Payer: Medicare Other | Admitting: Neurology

## 2016-05-06 ENCOUNTER — Encounter: Payer: Self-pay | Admitting: Neurology

## 2016-05-06 VITALS — BP 156/90 | HR 89 | Ht 74.0 in | Wt 191.2 lb

## 2016-05-06 DIAGNOSIS — I639 Cerebral infarction, unspecified: Secondary | ICD-10-CM | POA: Diagnosis not present

## 2016-05-06 DIAGNOSIS — Z8673 Personal history of transient ischemic attack (TIA), and cerebral infarction without residual deficits: Secondary | ICD-10-CM | POA: Insufficient documentation

## 2016-05-06 NOTE — Patient Instructions (Signed)
I had a long d/w patient about his recent stroke, risk for recurrent stroke/TIAs, personally independently reviewed imaging studies and stroke evaluation results and answered questions.Continue Plavix  for secondary stroke prevention and maintain strict control of hypertension with blood pressure goal below 130/90, diabetes with hemoglobin A1c goal below 6.5% and lipids with LDL cholesterol goal below 70 mg/dL. I also advised the patient to eat a healthy diet with plenty of whole grains, cereals, fruits and vegetables, exercise regularly and maintain ideal body weight. I advised him to quit smoking completely and he said he will try. He will continue follow-up in the cardiovascular research clinic for stroke A. fib trial. Followup in the future with stroke nurse practitioner in 6 months or call earlier if necessary Stroke Prevention Some medical conditions and behaviors are associated with an increased chance of having a stroke. You may prevent a stroke by making healthy choices and managing medical conditions. HOW CAN I REDUCE MY RISK OF HAVING A STROKE?   Stay physically active. Get at least 30 minutes of activity on most or all days.  Do not smoke. It may also be helpful to avoid exposure to secondhand smoke.  Limit alcohol use. Moderate alcohol use is considered to be:  No more than 2 drinks per day for men.  No more than 1 drink per day for nonpregnant women.  Eat healthy foods. This involves:  Eating 5 or more servings of fruits and vegetables a day.  Making dietary changes that address high blood pressure (hypertension), high cholesterol, diabetes, or obesity.  Manage your cholesterol levels.  Making food choices that are high in fiber and low in saturated fat, trans fat, and cholesterol may control cholesterol levels.  Take any prescribed medicines to control cholesterol as directed by your health care provider.  Manage your diabetes.  Controlling your carbohydrate and sugar  intake is recommended to manage diabetes.  Take any prescribed medicines to control diabetes as directed by your health care provider.  Control your hypertension.  Making food choices that are low in salt (sodium), saturated fat, trans fat, and cholesterol is recommended to manage hypertension.  Ask your health care provider if you need treatment to lower your blood pressure. Take any prescribed medicines to control hypertension as directed by your health care provider.  If you are 55-12 years of age, have your blood pressure checked every 3-5 years. If you are 63 years of age or older, have your blood pressure checked every year.  Maintain a healthy weight.  Reducing calorie intake and making food choices that are low in sodium, saturated fat, trans fat, and cholesterol are recommended to manage weight.  Stop drug abuse.  Avoid taking birth control pills.  Talk to your health care provider about the risks of taking birth control pills if you are over 74 years old, smoke, get migraines, or have ever had a blood clot.  Get evaluated for sleep disorders (sleep apnea).  Talk to your health care provider about getting a sleep evaluation if you snore a lot or have excessive sleepiness.  Take medicines only as directed by your health care provider.  For some people, aspirin or blood thinners (anticoagulants) are helpful in reducing the risk of forming abnormal blood clots that can lead to stroke. If you have the irregular heart rhythm of atrial fibrillation, you should be on a blood thinner unless there is a good reason you cannot take them.  Understand all your medicine instructions.  Make sure that other  conditions (such as anemia or atherosclerosis) are addressed. SEEK IMMEDIATE MEDICAL CARE IF:   You have sudden weakness or numbness of the face, arm, or leg, especially on one side of the body.  Your face or eyelid droops to one side.  You have sudden confusion.  You have  trouble speaking (aphasia) or understanding.  You have sudden trouble seeing in one or both eyes.  You have sudden trouble walking.  You have dizziness.  You have a loss of balance or coordination.  You have a sudden, severe headache with no known cause.  You have new chest pain or an irregular heartbeat. Any of these symptoms may represent a serious problem that is an emergency. Do not wait to see if the symptoms will go away. Get medical help at once. Call your local emergency services (911 in [place] Do not drive yourself to the hospital.   This information is not intended to replace advice given to you by your health care provider. Make sure you discuss any questions you have with your health care provider.   Document Released: 01/08/2005 Document Revised: 12/22/2014 Document Reviewed: 06/03/2013 Elsevier Interactive Patient Education [employer].

## 2016-05-06 NOTE — Progress Notes (Signed)
[facility] [address] [place]. [place] [address] [phone]       OFFICE FOLLOW-UP NOTE  [name] Date of Birth:  02-27-1941 Medical Record Number:  [id number]   HPI: 43 year African-American male seen today for first office follow-up visit for hospital admission for stroke in March 2017. Derrick Hendrix is a 75 y.o. male with multiple stroke risk factors who is not antiplatelets because he gets nosebleeds from them. For the last 2-3 days prior to presentation he  had headache and gait ataxia and this morning became very lightheaded and his neighbor called EMS. Ct in the ED shows a left cerebellar stroke that is subacute and c/w his clinical sx and hx. He denied CP or SOB.or non compliance with meds. He is a smoker. He reports severe nocturia to the point that he has a difficult time sleeping. LKW: 2-3 days ago tpa given?: no CT scan of the head showed a small focal infarct in the posterior left cerebellum but MRI scan confirmed a small patchy multifocal nonhemorrhagic infarcts in the left posterior inferior cerebellar artery territory. MRA of the brain showed a diminutive left vertebral artery which was occluded in the V4 segment was believed that the cause of the stroke. Carotid Doppler showed no significant extracranial stenosis transversely echo showed normal ejection fraction. LDL cholesterol of 95 mg percent and hemoglobin A1c is 5.7. Patient was enrolled in the stroke atrial fibrillation trial and was a randomized to the loop recorder insertion arm and got the procedure and so for A. fib has not been detected. He is been tolerating Plavix with some bruising but no major bleeding. He feels he has been for neurological recovery. His headaches have completely resolved. He is also tolerating Lipitor without muscle aches or pains. He states his blood pressure is usually quite good though today it is elevated at 156/90 and he blames this on having missed his  morning blood pressure medication dose. He has cut back smoking to 10 cigarettes per day but has not quit completely but states he will try to do so. He has kept his follow-up visits at the cardiovascular research clinic. ROS:   14 system review of systems is positive for  no complaints today and all systems negative PMH:  Past Medical History  Diagnosis Date  . Hypertension   . Hyperkalemia 08/2014  . Chronic kidney disease (CKD), stage III (moderate)   . High cholesterol   . Heart murmur   . PAD (peripheral artery disease) ([place])   . Daily headache     "lately" (04/17/2016)  . Stroke ([place]) 03/2016    hx of PAD; j"ust lots of headaches since" (04/17/2016)  . Depression     Social History:  Social History   Social History  . Marital Status: Single    Spouse Name: N/A  . Number of Children: N/A  . Years of Education: N/A   Occupational History  . Not on file.   Social History Main Topics  . Smoking status: Current Every Day Smoker -- 0.50 packs/day for 66 years    Types: Cigarettes  . Smokeless tobacco: Never Used     Comment: cutting back. LESS THAN 1/2 PPD, smokes 8 to 10 a day  . Alcohol Use: No     Comment: 04/17/2016 "nothing in the last 3-4 years"  . Drug Use: No  . Sexual Activity: No   Other Topics Concern  . Not on file   Social History Narrative  Works in the yard          Medications:   Current Outpatient Prescriptions on File Prior to Visit  Medication Sig Dispense Refill  . acetaminophen (TYLENOL) 500 MG tablet Take 2 tablets (1,000 mg total) by mouth every 6 (six) hours as needed for mild pain (or Fever >/= 101). 30 tablet 0  . atorvastatin (LIPITOR) 80 MG tablet Take 1 tablet (80 mg total) by mouth daily. 90 tablet 3  . clopidogrel (PLAVIX) 75 MG tablet Take 1 tablet (75 mg total) by mouth daily. 30 tablet 2  . finasteride (PROSCAR) 5 MG tablet Take 1 tablet (5 mg total) by mouth daily. 90 tablet 3  . fludrocortisone (FLORINEF) 0.1 MG tablet Take 2  tablets (0.2 mg total) by mouth daily in the morning. 90 tablet 3  . ipratropium (ATROVENT) 0.06 % nasal spray Place 2 sprays into the nose 3 (three) times daily. (Patient taking differently: Place 2 sprays into the nose daily as needed for rhinitis. ) 15 mL 2  . nitroGLYCERIN (NITROSTAT) 0.4 MG SL tablet Place 1 tablet (0.4 mg total) under the tongue every 5 (five) minutes x 3 doses as needed for chest pain. 30 tablet 3  . polyethylene glycol (MIRALAX) packet Take 17 g by mouth daily as needed (constipation). (Patient taking differently: Take 17 g by mouth every other day. ) 14 each 0  . sodium polystyrene (KAYEXALATE) 15 GM/60ML suspension Take 60 mLs (15 g total) by mouth every other day. 500 mL 0  . tamsulosin (FLOMAX) 0.4 MG CAPS capsule Take 1 capsule (0.4 mg total) by mouth daily after supper. 30 capsule 2   No current facility-administered medications on file prior to visit.    Allergies:   Allergies  Allergen Reactions  . Aspirin Other (See Comments)    Nosebleeds     Physical Exam General: well developed, well nourished elderly african [place] male, seated, in no evident distress Head: head normocephalic and atraumatic.  Neck: supple with no carotid or supraclavicular bruits Cardiovascular: regular rate and rhythm, no murmurs Musculoskeletal: no deformity Skin:  no rash/petichiae Vascular:  Normal pulses all extremities Filed Vitals:   05/06/16 1344  BP: 156/90  Pulse: 89   Neurologic Exam Mental Status: Awake and fully alert. Oriented to place and time. Recent and remote memory intact. Attention span, concentration and fund of knowledge appropriate. Mood and affect appropriate.  Cranial Nerves: Fundoscopic exam reveals sharp disc margins. Pupils equal, briskly reactive to light. Extraocular movements full without nystagmus. Visual fields full to confrontation. Hearing intact. Facial sensation intact. Face, tongue, palate moves normally and symmetrically.  Motor: Normal  bulk and tone. Normal strength in all tested extremity muscles. Sensory.: intact to touch ,pinprick .position and vibratory sensation.  Coordination: Rapid alternating movements normal in all extremities. Finger-to-nose and heel-to-shin performed accurately bilaterally. Gait and Station: Arises from chair without difficulty. Stance is normal. Gait demonstrates normal stride length and balance . Able to heel, toe and tandem walk with slight difficulty.  Reflexes: 1+ and symmetric. Toes downgoing.   NIHSS 0 Modified Rankin  0  ASSESSMENT: 73 year African-American male with left cerebellar infarct in March 2017 secondary to thrombotic occlusion of the left posterior inferior cerebellar artery. Multiple vascular risk factors of smoking, hypertension, hyperlipidemia, alcohol use. Patient is participating in the stroke atrial fibrillation trial and was randomized to the loop recorder insertion    PLAN: I had a long d/w patient about his recent stroke, risk for recurrent stroke/TIAs, personally  independently reviewed imaging studies and stroke evaluation results and answered questions.Continue Plavix  for secondary stroke prevention and maintain strict control of hypertension with blood pressure goal below 130/90, diabetes with hemoglobin A1c goal below 6.5% and lipids with LDL cholesterol goal below 70 mg/dL. I also advised the patient to eat a healthy diet with plenty of whole grains, cereals, fruits and vegetables, exercise regularly and maintain ideal body weight. I advised him to quit smoking completely and he said he will try. He will continue follow-up in the cardiovascular research clinic for stroke A. fib trial.Greater than 50% of time during this 25 minute visit was spent on counseling,explanation of diagnosis, planning of further management, discussion with patient and family and coordination of care  Followup in the future with stroke nurse practitioner in 6 months or call earlier if  necessary Antony Contras, MD  [facility] [address] [place], [place] [address]  Phone [phone] Fax [phone]  Note: This document was prepared with digital dictation and possible smart phrase technology. Any transcriptional errors that result from this process are unintentional

## 2016-05-09 DIAGNOSIS — N183 Chronic kidney disease, stage 3 (moderate): Secondary | ICD-10-CM | POA: Diagnosis not present

## 2016-05-09 DIAGNOSIS — I739 Peripheral vascular disease, unspecified: Secondary | ICD-10-CM | POA: Diagnosis not present

## 2016-05-09 DIAGNOSIS — I951 Orthostatic hypotension: Secondary | ICD-10-CM | POA: Diagnosis not present

## 2016-05-09 DIAGNOSIS — E2740 Unspecified adrenocortical insufficiency: Secondary | ICD-10-CM | POA: Diagnosis not present

## 2016-05-09 DIAGNOSIS — E875 Hyperkalemia: Secondary | ICD-10-CM | POA: Diagnosis not present

## 2016-05-09 DIAGNOSIS — I129 Hypertensive chronic kidney disease with stage 1 through stage 4 chronic kidney disease, or unspecified chronic kidney disease: Secondary | ICD-10-CM | POA: Diagnosis not present

## 2016-05-12 LAB — CUP PACEART REMOTE DEVICE CHECK: Date Time Interrogation Session: 20170529064233

## 2016-05-12 NOTE — Progress Notes (Signed)
Patient ID: Derrick Hendrix, male   DOB: [birth year], 75 y.o.   MRN: [id number] Carelink summary report received. Battery status OK. Normal device function. No new symptom episodes, tachy episodes, brady, or pause episodes. AF episodes are SR w PAC's. Monthly summary reports and ROV/PRN

## 2016-05-13 DIAGNOSIS — E875 Hyperkalemia: Secondary | ICD-10-CM | POA: Diagnosis not present

## 2016-05-13 DIAGNOSIS — I951 Orthostatic hypotension: Secondary | ICD-10-CM | POA: Diagnosis not present

## 2016-05-13 DIAGNOSIS — N183 Chronic kidney disease, stage 3 (moderate): Secondary | ICD-10-CM | POA: Diagnosis not present

## 2016-05-13 DIAGNOSIS — E2740 Unspecified adrenocortical insufficiency: Secondary | ICD-10-CM | POA: Diagnosis not present

## 2016-05-13 DIAGNOSIS — I739 Peripheral vascular disease, unspecified: Secondary | ICD-10-CM | POA: Diagnosis not present

## 2016-05-13 DIAGNOSIS — I129 Hypertensive chronic kidney disease with stage 1 through stage 4 chronic kidney disease, or unspecified chronic kidney disease: Secondary | ICD-10-CM | POA: Diagnosis not present

## 2016-05-14 ENCOUNTER — Encounter: Payer: Self-pay | Admitting: Internal Medicine

## 2016-05-14 ENCOUNTER — Encounter: Payer: Self-pay | Admitting: Licensed Clinical Social Worker

## 2016-05-14 ENCOUNTER — Ambulatory Visit (INDEPENDENT_AMBULATORY_CARE_PROVIDER_SITE_OTHER): Payer: Medicare Other | Admitting: Internal Medicine

## 2016-05-14 VITALS — BP 155/72 | HR 78 | Temp 97.7°F | Resp 19 | Wt 190.3 lb

## 2016-05-14 DIAGNOSIS — E875 Hyperkalemia: Secondary | ICD-10-CM

## 2016-05-14 DIAGNOSIS — I10 Essential (primary) hypertension: Secondary | ICD-10-CM

## 2016-05-14 DIAGNOSIS — F17210 Nicotine dependence, cigarettes, uncomplicated: Secondary | ICD-10-CM | POA: Diagnosis not present

## 2016-05-14 DIAGNOSIS — R42 Dizziness and giddiness: Secondary | ICD-10-CM

## 2016-05-14 DIAGNOSIS — I951 Orthostatic hypotension: Secondary | ICD-10-CM

## 2016-05-14 DIAGNOSIS — E2749 Other adrenocortical insufficiency: Secondary | ICD-10-CM | POA: Diagnosis present

## 2016-05-14 NOTE — Patient Instructions (Addendum)
I am glad to hear you are feeling better than at the last time we were able to meet.  Your blood pressure continues to be slightly high. It is however lower when standing compared to lying or sitting. Because you are free of symptoms at this visit I would like to continue on your current treatments and follow up in 4-6 weeks.  We will also check your blood work for potassium level today. I will call you if there is a problem with this result that needs any change of plans.

## 2016-05-14 NOTE — Progress Notes (Signed)
Subjective:   Patient ID: Derrick Hendrix male   DOB: Apr 19, 1941 75 y.o.   MRN: [id number]  HPI: [name] is a 75 y.o. man with PMHx detailed below who presents as a 3 week follow up after a change off all diuretics and managing his hyperkalemia with every other day kayexalate. During this time he has been visited with home health nursing who has also noted significant orthostatic variation in his blood pressure. He has been feeling much better during this time and is free of headaches and lightheadedness to a degree he has not had since his stroke earlier this year.  See problem based assessment and plan below for additional details.  Past Medical History  Diagnosis Date  . Hypertension   . Hyperkalemia 08/2014  . Chronic kidney disease (CKD), stage III (moderate)   . High cholesterol   . Heart murmur   . PAD (peripheral artery disease) ([place])   . Daily headache     "lately" (04/17/2016)  . Stroke ([place]) 03/2016    hx of PAD; j"ust lots of headaches since" (04/17/2016)  . Depression    Current Outpatient Prescriptions  Medication Sig Dispense Refill  . acetaminophen (TYLENOL) 500 MG tablet Take 2 tablets (1,000 mg total) by mouth every 6 (six) hours as needed for mild pain (or Fever >/= 101). 30 tablet 0  . atorvastatin (LIPITOR) 80 MG tablet Take 1 tablet (80 mg total) by mouth daily. 90 tablet 3  . clopidogrel (PLAVIX) 75 MG tablet Take 1 tablet (75 mg total) by mouth daily. 30 tablet 2  . finasteride (PROSCAR) 5 MG tablet Take 1 tablet (5 mg total) by mouth daily. 90 tablet 3  . fludrocortisone (FLORINEF) 0.1 MG tablet Take 2 tablets (0.2 mg total) by mouth daily in the morning. 90 tablet 3  . ipratropium (ATROVENT) 0.06 % nasal spray Place 2 sprays into the nose 3 (three) times daily. (Patient taking differently: Place 2 sprays into the nose daily as needed for rhinitis. ) 15 mL 2  . nitroGLYCERIN (NITROSTAT) 0.4 MG SL tablet Place 1 tablet (0.4 mg total) under the tongue every  5 (five) minutes x 3 doses as needed for chest pain. 30 tablet 3  . polyethylene glycol (MIRALAX) packet Take 17 g by mouth daily as needed (constipation). (Patient taking differently: Take 17 g by mouth every other day. ) 14 each 0  . sodium polystyrene (KAYEXALATE) 15 GM/60ML suspension Take 60 mLs (15 g total) by mouth every other day. 500 mL 0  . tamsulosin (FLOMAX) 0.4 MG CAPS capsule Take 1 capsule (0.4 mg total) by mouth daily after supper. 30 capsule 2   No current facility-administered medications for this visit.   Family History  Problem Relation Age of Onset  . Hypertension Mother     died age 65  . Diabetes Mother    Social History   Social History  . Marital Status: Single    Spouse Name: N/A  . Number of Children: N/A  . Years of Education: N/A   Social History Main Topics  . Smoking status: Current Every Day Smoker -- 0.50 packs/day for 66 years    Types: Cigarettes  . Smokeless tobacco: Never Used     Comment: cutting back. LESS THAN 1/2 PPD, smokes 8 to 10 a day  . Alcohol Use: No     Comment: 04/17/2016 "nothing in the last 3-4 years"  . Drug Use: No  . Sexual Activity: No   Other Topics Concern  .  None   Social History Narrative   Works in the yard         Review of Systems: Review of Systems  Eyes: Negative for blurred vision.  Respiratory: Negative for shortness of breath.   Cardiovascular: Negative for leg swelling.  Gastrointestinal: Positive for constipation.  Musculoskeletal: Negative for falls.  Neurological: Negative for dizziness, weakness and headaches.    Objective:  Physical Exam: Filed Vitals:   05/14/16 0929  BP: 155/72  Pulse: 78  Temp: 97.7 F (36.5 C)  TempSrc: Oral  Resp: 19  Weight: 190 lb 4.8 oz (86.32 kg)  SpO2: 99%   GENERAL- alert, co-operative, NAD HEENT- Atraumatic, oral mucosa appears moist CARDIAC- RRR, no murmurs, rubs or gallops. RESP- CTAB, no wheezes or crackles. EXTREMITIES- symmetric, no pedal  edema. SKIN- Warm, dry, No rash or lesion. PSYCH- Normal mood and affect, appropriate thought content and speech.  Assessment & Plan:

## 2016-05-14 NOTE — Progress Notes (Signed)
Patient ID: Derrick Hendrix, male   DOB: [birth year], 75 y.o.   MRN: [id number] CSW discussed with [facility] CSW.  [facility] confirmed SCAT punch card was mailed and will be unable to provide [name] with add'l punch cards due to patient declining services.  [name] confirms he has been approved and received his Medicaid card.  Pt encouraged to utilize Medicaid medical transportation in the future.

## 2016-05-14 NOTE — Progress Notes (Signed)
[name] requesting to speak with CSW regarding transportation.  CSW met with pt following his [facility] appointment.  [name] inquired about receiving a SCAT punch card to assist with transportation to/from his medical appointments.  CSW inquired if pt received his approved [employer] SCAT punch card via mail.  [name] was unaware and states he did not receive SCAT pass from [facility] via mail, only received one from [employer] customer service.  Pt showed CSW [place] correspondence.  CSW informed [name] [facility] had approved a SCAT punch card and it was placed in the mail and confirmed his address.  CSW send EPIC message to notify [place], [name] had not received SCAT punch card.  This worker checked pt's chart to f/u on prior [place] application, checked [place].  Pt showing as [place] eligible for May 2017.  CSW inquired if pt to see if he received a Medicaid card in the mail, pt confirmed.  CSW informed [name] medical transportation if provided as a benefit through his [employer].  CSW provided pt with the information to access this benefit.  Pt should utilize Medicaid/[employer] transportation for medical transportation.

## 2016-05-15 LAB — BMP8+ANION GAP
ANION GAP: 19 mmol/L — AB (ref 10.0–18.0)
BUN/Creatinine Ratio: 20 (ref 10–24)
BUN: 27 mg/dL (ref 8–27)
CHLORIDE: 105 mmol/L (ref 96–106)
CO2: 21 mmol/L (ref 18–29)
Calcium: 9.5 mg/dL (ref 8.6–10.2)
Creatinine, Ser: 1.35 mg/dL — ABNORMAL HIGH (ref 0.76–1.27)
GFR, EST AFRICAN AMERICAN: 58 mL/min/{1.73_m2} — AB (ref >59)
GFR, EST NON AFRICAN AMERICAN: 50 mL/min/{1.73_m2} — AB (ref >59)
GLUCOSE: 83 mg/dL (ref 65–99)
POTASSIUM: 4.9 mmol/L (ref 3.5–5.2)
SODIUM: 145 mmol/L — AB (ref 134–144)

## 2016-05-16 ENCOUNTER — Telehealth: Payer: Self-pay | Admitting: Cardiology

## 2016-05-16 NOTE — Telephone Encounter (Signed)
Spoke w/ pt and requested that he send a manual transmission b/c his home monitor has not updated in at least 14 days.   

## 2016-05-16 NOTE — Assessment & Plan Note (Signed)
Continues to have orthostatic hypotension on vitals checked in clinic today SBP dropped from 160 to 140 with postural change. Compared to a month ago pressures are about 20 points higher which is not ideal risk factor modification but symptomatically he is vastly improved with these targets.  Will plan to continue at this current treatment plan and reassess in 4-6 weeks. Overall I would favor continuing a less rigorous treatment of his hypertension if he continues to be feeling so much better on this. It will need another discussion of the risks and benefits with him at that time.

## 2016-05-16 NOTE — Assessment & Plan Note (Signed)
Assessment: Potassium remains stable today at 4.9 on repeat Bmet. He reports compliance with his QOD kayexalate. He also reports he has been trying to adjust to a lower potassium diet. He has also been doing much better with his orthostatic dizziness and headaches. We are currently tolerating a blood pressure above goal given his risk factors and recent stroke history due to symptoms and fall risks with more aggressive thresholds.  Plan: -continue kayexalate 15g QOD -continue fludrocortisone 0.2mg 

## 2016-05-16 NOTE — Progress Notes (Signed)
Medicine attending: Medical history, presenting problems, physical findings, and medications, reviewed with resident physician Dr Christopher Rice on the day of the patient visit and I concur with his evaluation and management plan. 

## 2016-05-19 DIAGNOSIS — I951 Orthostatic hypotension: Secondary | ICD-10-CM | POA: Diagnosis not present

## 2016-05-19 DIAGNOSIS — E875 Hyperkalemia: Secondary | ICD-10-CM | POA: Diagnosis not present

## 2016-05-19 DIAGNOSIS — E2740 Unspecified adrenocortical insufficiency: Secondary | ICD-10-CM | POA: Diagnosis not present

## 2016-05-19 DIAGNOSIS — I739 Peripheral vascular disease, unspecified: Secondary | ICD-10-CM | POA: Diagnosis not present

## 2016-05-19 DIAGNOSIS — N183 Chronic kidney disease, stage 3 (moderate): Secondary | ICD-10-CM | POA: Diagnosis not present

## 2016-05-19 DIAGNOSIS — I129 Hypertensive chronic kidney disease with stage 1 through stage 4 chronic kidney disease, or unspecified chronic kidney disease: Secondary | ICD-10-CM | POA: Diagnosis not present

## 2016-05-26 DIAGNOSIS — N183 Chronic kidney disease, stage 3 (moderate): Secondary | ICD-10-CM | POA: Diagnosis not present

## 2016-05-26 DIAGNOSIS — I951 Orthostatic hypotension: Secondary | ICD-10-CM | POA: Diagnosis not present

## 2016-05-26 DIAGNOSIS — E875 Hyperkalemia: Secondary | ICD-10-CM | POA: Diagnosis not present

## 2016-05-26 DIAGNOSIS — E2740 Unspecified adrenocortical insufficiency: Secondary | ICD-10-CM | POA: Diagnosis not present

## 2016-05-26 DIAGNOSIS — I739 Peripheral vascular disease, unspecified: Secondary | ICD-10-CM | POA: Diagnosis not present

## 2016-05-26 DIAGNOSIS — I129 Hypertensive chronic kidney disease with stage 1 through stage 4 chronic kidney disease, or unspecified chronic kidney disease: Secondary | ICD-10-CM | POA: Diagnosis not present

## 2016-05-28 NOTE — Addendum Note (Signed)
Addended by: Mechele Dawley on: 05/28/2016 04:48 PM   Modules accepted: Level of Service

## 2016-05-30 ENCOUNTER — Telehealth: Payer: Self-pay | Admitting: Cardiology

## 2016-05-30 NOTE — Telephone Encounter (Signed)
LMOVM requesting that pt send manual transmission b/c home monitor has not updated in at least 14 days.    

## 2016-06-02 ENCOUNTER — Ambulatory Visit: Payer: Medicare Other | Admitting: *Deleted

## 2016-06-02 DIAGNOSIS — I639 Cerebral infarction, unspecified: Secondary | ICD-10-CM | POA: Diagnosis not present

## 2016-06-03 DIAGNOSIS — N183 Chronic kidney disease, stage 3 (moderate): Secondary | ICD-10-CM | POA: Diagnosis not present

## 2016-06-03 DIAGNOSIS — I129 Hypertensive chronic kidney disease with stage 1 through stage 4 chronic kidney disease, or unspecified chronic kidney disease: Secondary | ICD-10-CM | POA: Diagnosis not present

## 2016-06-03 DIAGNOSIS — I951 Orthostatic hypotension: Secondary | ICD-10-CM | POA: Diagnosis not present

## 2016-06-03 DIAGNOSIS — E2740 Unspecified adrenocortical insufficiency: Secondary | ICD-10-CM | POA: Diagnosis not present

## 2016-06-03 DIAGNOSIS — E875 Hyperkalemia: Secondary | ICD-10-CM | POA: Diagnosis not present

## 2016-06-03 DIAGNOSIS — I739 Peripheral vascular disease, unspecified: Secondary | ICD-10-CM | POA: Diagnosis not present

## 2016-06-04 NOTE — Progress Notes (Signed)
Carelink Summary Report / Loop Recorder 

## 2016-06-10 DIAGNOSIS — I951 Orthostatic hypotension: Secondary | ICD-10-CM | POA: Diagnosis not present

## 2016-06-10 DIAGNOSIS — E2740 Unspecified adrenocortical insufficiency: Secondary | ICD-10-CM | POA: Diagnosis not present

## 2016-06-10 DIAGNOSIS — E875 Hyperkalemia: Secondary | ICD-10-CM | POA: Diagnosis not present

## 2016-06-10 DIAGNOSIS — I129 Hypertensive chronic kidney disease with stage 1 through stage 4 chronic kidney disease, or unspecified chronic kidney disease: Secondary | ICD-10-CM | POA: Diagnosis not present

## 2016-06-10 DIAGNOSIS — N183 Chronic kidney disease, stage 3 (moderate): Secondary | ICD-10-CM | POA: Diagnosis not present

## 2016-06-10 DIAGNOSIS — I739 Peripheral vascular disease, unspecified: Secondary | ICD-10-CM | POA: Diagnosis not present

## 2016-06-17 DIAGNOSIS — I129 Hypertensive chronic kidney disease with stage 1 through stage 4 chronic kidney disease, or unspecified chronic kidney disease: Secondary | ICD-10-CM | POA: Diagnosis not present

## 2016-06-17 DIAGNOSIS — E2740 Unspecified adrenocortical insufficiency: Secondary | ICD-10-CM | POA: Diagnosis not present

## 2016-06-17 DIAGNOSIS — E875 Hyperkalemia: Secondary | ICD-10-CM | POA: Diagnosis not present

## 2016-06-17 DIAGNOSIS — N183 Chronic kidney disease, stage 3 (moderate): Secondary | ICD-10-CM | POA: Diagnosis not present

## 2016-06-17 DIAGNOSIS — I739 Peripheral vascular disease, unspecified: Secondary | ICD-10-CM | POA: Diagnosis not present

## 2016-06-17 DIAGNOSIS — I951 Orthostatic hypotension: Secondary | ICD-10-CM | POA: Diagnosis not present

## 2016-06-23 ENCOUNTER — Encounter: Payer: Self-pay | Admitting: Internal Medicine

## 2016-07-10 ENCOUNTER — Ambulatory Visit (INDEPENDENT_AMBULATORY_CARE_PROVIDER_SITE_OTHER): Payer: Medicare Other | Admitting: Internal Medicine

## 2016-07-10 ENCOUNTER — Encounter: Payer: Self-pay | Admitting: Internal Medicine

## 2016-07-10 ENCOUNTER — Ambulatory Visit (HOSPITAL_COMMUNITY)
Admission: RE | Admit: 2016-07-10 | Discharge: 2016-07-10 | Disposition: A | Discharge status: Home / Self Care | Payer: Medicare Other | Source: Ambulatory Visit | Attending: Internal Medicine | Admitting: Internal Medicine

## 2016-07-10 ENCOUNTER — Encounter (INDEPENDENT_AMBULATORY_CARE_PROVIDER_SITE_OTHER): Payer: Self-pay

## 2016-07-10 VITALS — BP 187/95 | HR 81 | Temp 97.6°F | Ht 74.0 in | Wt 198.2 lb

## 2016-07-10 DIAGNOSIS — M25571 Pain in right ankle and joints of right foot: Secondary | ICD-10-CM | POA: Insufficient documentation

## 2016-07-10 DIAGNOSIS — F17210 Nicotine dependence, cigarettes, uncomplicated: Secondary | ICD-10-CM | POA: Diagnosis not present

## 2016-07-10 DIAGNOSIS — E875 Hyperkalemia: Secondary | ICD-10-CM | POA: Diagnosis not present

## 2016-07-10 DIAGNOSIS — I951 Orthostatic hypotension: Secondary | ICD-10-CM

## 2016-07-10 DIAGNOSIS — M13171 Monoarthritis, not elsewhere classified, right ankle and foot: Secondary | ICD-10-CM

## 2016-07-10 DIAGNOSIS — M25471 Effusion, right ankle: Secondary | ICD-10-CM | POA: Diagnosis not present

## 2016-07-10 LAB — BASIC METABOLIC PANEL
ANION GAP: 5 (ref 5–15)
BUN: 29 mg/dL — ABNORMAL HIGH (ref 6–20)
CHLORIDE: 109 mmol/L (ref 101–111)
CO2: 28 mmol/L (ref 22–32)
Calcium: 9.1 mg/dL (ref 8.9–10.3)
Creatinine, Ser: 1.45 mg/dL — ABNORMAL HIGH (ref 0.61–1.24)
GFR calc Af Amer: 52 mL/min — ABNORMAL LOW (ref >60)
GFR calc non Af Amer: 45 mL/min — ABNORMAL LOW (ref >60)
GLUCOSE: 91 mg/dL (ref 65–99)
POTASSIUM: 4.5 mmol/L (ref 3.5–5.1)
Sodium: 142 mmol/L (ref 135–145)

## 2016-07-10 NOTE — Assessment & Plan Note (Signed)
BMP today shows normal potassium in the setting of having been off Kayexalate for several weeks. Patient reports that she is compliant with his cortisone which seems to be sufficient to control his hyperkalemia. We'll plan follow-up with his PCP.

## 2016-07-10 NOTE — Assessment & Plan Note (Addendum)
Patient presents with a seven-day history of pain and swelling of the right ankle. He notes that this is tender to palpation and with active range of motion. She also notes there is some pain with movement. Swelling is somewhat reduced with elevation and pain is helped by ice. He notes that his symptoms got worse over the first 3-4 days but have since resolved somewhat. He presents today with a significant soft tissue edema around the old. This edema does not extend to the tibia. He also has a small amount of ankle effusion noted on physical exam. The joint was attempted to be aspirated for suspicion of gout. But joint aspirate was unable to be obtained.  Recommend empiric treatment with NSAIDs for the next week. Patient was counseled to follow up if symptoms do not improve with this therapy or his symptoms worsen. At this point presentation is most consistent with a monoarticular arthropathy. Left ankle does not appear affected. Renal function is stable at this point and feel that he would tolerate a small dose short course of ibuprofen. As we are not convinced that this is gout we will avoid colchicine due to side effect profile.

## 2016-07-10 NOTE — Progress Notes (Signed)
Internal Medicine Clinic Attending  I saw and evaluated the patient.  I personally confirmed the key portions of the history and exam documented by Dr. Gay Filler and I reviewed pertinent patient test results.  The assessment, diagnosis, and plan were formulated together and I agree with the documentation in the resident's note.  Acute monoarticular arthritis of the right ankle with small effusion present. We were unable to sample the effusion to rule out crystal arthritis. Plan will be for supportive care and NSAID therapy for one week. If the pain does not improve with supportive care, we could do advanced imaging to rule out small fracture or refer to ankle specialist.

## 2016-07-10 NOTE — Patient Instructions (Addendum)
For the pain and swelling of the right ankle, we recommend that you take a short course of NSAIDs. These medications can find over-the-counter such as Advil or Motrin. These take 400 mg 3 times a day for the next 5 days. If your symptoms do not improve over or worsen this time please call our clinic back and come in for further evaluation.  Your potassium today was within normal limits. We feel that at this point it is okay not to take the Kayexalate to reduce your potassium.  In terms of irregular heart rhythm. Your EKG was reassuring in clinic today. We will have you follow-up with cardiology.

## 2016-07-10 NOTE — Progress Notes (Signed)
CC: Right ankle pain and swelling HPI: [name] is a 75 y.o. male with a h/o of CKD stage III, hyporeninemic hypoaldosteronism, hyperkalemia, history of cryptogenic stroke, hypertension, orthostatic hypotension, BPH who presents for one-week history of right ankle pain and swelling.  Please see Problem-based charting for HPI and the status of patient's chronic medical conditions.  Past Medical History:  Diagnosis Date  . Chronic kidney disease (CKD), stage III (moderate)   . Daily headache    "lately" (04/17/2016)  . Depression   . Heart murmur   . High cholesterol   . Hyperkalemia 08/2014  . Hypertension   . PAD (peripheral artery disease) ([place])   . Stroke ([place]) 03/2016   hx of PAD; j"ust lots of headaches since" (04/17/2016)   Review of Systems: ROS in HPI. Otherwise: Review of Systems  Constitutional: Negative for chills, fever and weight loss.  Eyes: Negative for blurred vision.  Respiratory: Negative for cough and shortness of breath.   Cardiovascular: Positive for leg swelling (soft tissue swelling of right ankle, mild swelling of left ankle). Negative for chest pain, palpitations and PND.  Musculoskeletal: Positive for joint pain (right ankle pain). Negative for myalgias.  Neurological: Negative for dizziness and headaches.    Physical Exam: Vitals:   07/10/16 0932  BP: (!) 187/95  Pulse: 81  Temp: 97.6 F (36.4 C)  TempSrc: Axillary  Weight: 198 lb 3.2 oz (89.9 kg)  Height: 6\' 2"  (1.88 m)   Physical Exam  Constitutional: He is oriented to person, place, and time. He is cooperative. No distress.  HENT:  Head: Normocephalic and atraumatic.  Cardiovascular: Normal rate and normal heart sounds.  An irregular rhythm present. Exam reveals no gallop.   No murmur heard. Pulses:      Radial pulses are 2+ on the right side, and 2+ on the left side.       Dorsalis pedis pulses are 1+ on the right side, and 1+ on the left side.  Pulmonary/Chest: Effort normal  and breath sounds normal. No respiratory distress. He has no wheezes. He has no rhonchi. He has no rales.  Abdominal: Soft. Bowel sounds are normal. There is no tenderness.  Musculoskeletal:       Right ankle: He exhibits decreased range of motion and swelling. He exhibits no ecchymosis, no deformity and no laceration. Tenderness. Lateral malleolus tenderness found. No medial malleolus tenderness found. Achilles tendon exhibits pain.       Left ankle: He exhibits normal range of motion and no swelling. No tenderness. Achilles tendon exhibits no pain.       Right foot: There is normal range of motion, no tenderness, no bony tenderness and no swelling.       Left foot: There is decreased range of motion, tenderness and swelling. There is no crepitus, no deformity and no laceration.  Neurological: He is alert and oriented to person, place, and time.  Skin: Skin is warm, dry and intact.  Psychiatric: He has a normal mood and affect.    Assessment & Plan:  See encounters tab for problem based medical decision making. Patient seen with Dr. Evette Doffing  Hyperkalemia BMP today shows normal potassium in the setting of having been off Kayexalate for several weeks. Patient reports that she is compliant with his cortisone which seems to be sufficient to control his hyperkalemia. We'll plan follow-up with his PCP.  Orthostatic hypotension Patient denies any continued symptoms of orthostatic hypotension. Denies headaches and dizziness today. He does note  that at times when he bends over to touch his feet he gets some dizziness. This occurs only with extreme changes in position. His blood pressure was elevated today 187/95 in the setting of acute pain. Records indicate that he has filled a prescription for carvedilol 3.125 mg twice a day recently. I could find no record of this on his last visit for his hospital discharge summary. This may have been started by cardiology. Because his blood pressure is still a bit  elevated and his pulse rate is within normal limits I will continue this medication for now. I recommend further investigation at his next visit with his PCP.  Pain and swelling of right ankle Patient presents with a seven-day history of pain and swelling of the right ankle. He notes that this is tender to palpation and with active range of motion. She also notes there is some pain with movement. Swelling is somewhat reduced with elevation and pain is helped by ice. He notes that his symptoms got worse over the first 3-4 days but have since resolved somewhat. He presents today with a significant soft tissue edema around the old. This edema does not extend to the tibia. He also has a small amount of ankle effusion noted on physical exam. The joint was attempted to be aspirated for suspicion of gout. But joint aspirate was unable to be obtained.  Recommend empiric treatment with NSAIDs for the next week. Patient was counseled to follow up if symptoms do not improve with this therapy or his symptoms worsen. At this point presentation is most consistent with a monoarticular arthropathy. Left ankle does not appear affected. Renal function is stable at this point and feel that he would tolerate a small dose short course of ibuprofen. As we are not convinced that this is gout we will avoid colchicine due to side effect profile.   Signed: Holley Raring, MD 07/10/2016, 11:05 AM  Pager: [phone]

## 2016-07-10 NOTE — Progress Notes (Signed)
Diagnostic Ultrasound Note:   75 year old man with new right ankle pain for five days. On exam the ankle is swollen with 1+ pitting edema, it is tender with passive and active range of motion in the ankle joint. I thought I felt a small effusion on exam. We used a point of care ultrasound which showed a small effusion along the talus. I attempted to sample the effusion with arthrocentesis to rule out crystal arthritis, but the effusion was too small to access on two attempts. Below is a longitudinal view of the medial anterior ankle showing the joint space between the tibia and talus with a small effusion.

## 2016-07-10 NOTE — Assessment & Plan Note (Addendum)
Patient denies any continued symptoms of orthostatic hypotension. Denies headaches and dizziness today. He does note that at times when he bends over to touch his feet he gets some dizziness. This occurs only with extreme changes in position. His blood pressure was elevated today 187/95 in the setting of acute pain. Records indicate that he has filled a prescription for carvedilol 3.125 mg twice a day recently. I could find no record of this on his last visit for his hospital discharge summary. This may have been started by cardiology. Because his blood pressure is still a bit elevated and his pulse rate is within normal limits I will continue this medication for now. I recommend further investigation at his next visit with his PCP.

## 2016-07-25 ENCOUNTER — Other Ambulatory Visit: Payer: Self-pay | Admitting: *Deleted

## 2016-07-25 MED ORDER — CLOPIDOGREL BISULFATE 75 MG PO TABS: 75.0000 mg | ORAL_TABLET | Freq: Every day | ORAL | 2 refills | Status: DC

## 2016-08-14 ENCOUNTER — Encounter: Payer: Self-pay | Admitting: *Deleted

## 2016-08-14 ENCOUNTER — Telehealth: Payer: Self-pay | Admitting: *Deleted

## 2016-08-14 NOTE — Telephone Encounter (Signed)
[facility] appointment scheduled for 08/19/16 at 10 am for device check. Explained to patient to only do maunel transmission when requested. Patient stated that someone already told him that.

## 2016-08-19 ENCOUNTER — Encounter: Payer: Self-pay | Admitting: Internal Medicine

## 2016-08-19 ENCOUNTER — Encounter: Payer: Self-pay | Admitting: *Deleted

## 2016-08-19 DIAGNOSIS — Z006 Encounter for examination for normal comparison and control in clinical research program: Secondary | ICD-10-CM

## 2016-08-19 NOTE — Progress Notes (Signed)
STROKE-AF Research study 6 month office visit with loop interrogation completed. mRS 0 (no symptoms). Patient states that he doesn't think he has had any medication changes. Questions encouraged and answered.

## 2016-08-22 ENCOUNTER — Encounter: Payer: Medicare Other | Admitting: Internal Medicine

## 2016-09-12 ENCOUNTER — Other Ambulatory Visit: Payer: Self-pay | Admitting: *Deleted

## 2016-09-12 DIAGNOSIS — R339 Retention of urine, unspecified: Secondary | ICD-10-CM

## 2016-09-12 MED ORDER — FLUDROCORTISONE ACETATE 0.1 MG PO TABS: ORAL_TABLET | ORAL | 0 refills | Status: DC

## 2016-09-12 MED ORDER — TAMSULOSIN HCL 0.4 MG PO CAPS: 0.4000 mg | ORAL_CAPSULE | Freq: Every day | ORAL | 0 refills | Status: DC

## 2016-09-16 ENCOUNTER — Encounter: Payer: Self-pay | Admitting: Acute Care

## 2016-10-14 ENCOUNTER — Other Ambulatory Visit: Payer: Self-pay | Admitting: *Deleted

## 2016-10-15 MED ORDER — CLOPIDOGREL BISULFATE 75 MG PO TABS: 75.0000 mg | ORAL_TABLET | Freq: Every day | ORAL | 2 refills | Status: DC

## 2016-10-16 ENCOUNTER — Other Ambulatory Visit: Payer: Self-pay | Admitting: *Deleted

## 2016-10-16 DIAGNOSIS — R339 Retention of urine, unspecified: Secondary | ICD-10-CM

## 2016-10-16 MED ORDER — TAMSULOSIN HCL 0.4 MG PO CAPS: 0.4000 mg | ORAL_CAPSULE | Freq: Every day | ORAL | 1 refills | Status: DC

## 2016-10-16 MED ORDER — FLUDROCORTISONE ACETATE 0.1 MG PO TABS: ORAL_TABLET | ORAL | 1 refills | Status: DC

## 2016-10-31 ENCOUNTER — Encounter: Payer: Medicare Other | Admitting: Internal Medicine

## 2016-11-10 ENCOUNTER — Other Ambulatory Visit: Payer: Self-pay | Admitting: *Deleted

## 2016-11-10 MED ORDER — FINASTERIDE 5 MG PO TABS: 5.0000 mg | ORAL_TABLET | Freq: Every day | ORAL | 1 refills | Status: DC

## 2016-11-10 MED ORDER — ATORVASTATIN CALCIUM 80 MG PO TABS: 80.0000 mg | ORAL_TABLET | Freq: Every day | ORAL | 1 refills | Status: DC

## 2016-11-11 ENCOUNTER — Encounter: Payer: Self-pay | Admitting: Nurse Practitioner

## 2016-11-11 ENCOUNTER — Ambulatory Visit (INDEPENDENT_AMBULATORY_CARE_PROVIDER_SITE_OTHER): Payer: Medicare Other | Admitting: Nurse Practitioner

## 2016-11-11 VITALS — BP 144/82 | HR 83 | Ht 74.0 in | Wt 205.4 lb

## 2016-11-11 DIAGNOSIS — I10 Essential (primary) hypertension: Secondary | ICD-10-CM

## 2016-11-11 DIAGNOSIS — I63532 Cerebral infarction due to unspecified occlusion or stenosis of left posterior cerebral artery: Secondary | ICD-10-CM | POA: Diagnosis not present

## 2016-11-11 DIAGNOSIS — I639 Cerebral infarction, unspecified: Secondary | ICD-10-CM | POA: Diagnosis not present

## 2016-11-11 DIAGNOSIS — E785 Hyperlipidemia, unspecified: Secondary | ICD-10-CM | POA: Insufficient documentation

## 2016-11-11 DIAGNOSIS — F17200 Nicotine dependence, unspecified, uncomplicated: Secondary | ICD-10-CM | POA: Diagnosis not present

## 2016-11-11 HISTORY — DX: Hyperlipidemia, unspecified: E78.5

## 2016-11-11 NOTE — Progress Notes (Signed)
I have reviewed and agreed above plan. 

## 2016-11-11 NOTE — Patient Instructions (Signed)
Continue Plavix  for secondary stroke prevention maintain strict control of hypertension with blood pressure goal below 130/90,  lipids with LDL cholesterol goal below 70 mg/dL.  eat a healthy diet with plenty of whole grains, cereals, fruits and vegetables,  exercise regularly by walking I advised him to quit smoking completely  He will continue follow-up in the cardiovascular research clinic for stroke A. fib trial F/U here in 6 months if stable will dismiss

## 2016-11-11 NOTE — Progress Notes (Signed)
[facility]  PATIENT: Derrick Hendrix DOB: [birth year]   REASON FOR VISIT: Follow-up for history of stroke HISTORY FROM: Patient  HISTORY OF PRESENT ILLNESS:05/06/16 PS57 year African-American male seen today for first office follow-up visit for hospital admission for stroke in March 2017. [name] is a 75 y.o. male with multiple stroke risk factors who is not antiplatelets because he gets nosebleeds from them. For the last 2-3 days prior to presentation he  had headache and gait ataxia and this morning became very lightheaded and his neighbor called EMS. Ct in the ED shows a left cerebellar stroke that is subacute and c/w his clinical sx and hx. He denied CP or SOB.or non compliance with meds. He is a smoker. He reports severe nocturia to the point that he has a difficult time sleeping. LKW: 2-3 days ago tpa given?: no CT scan of the head showed a small focal infarct in the posterior left cerebellum but MRI scan confirmed a small patchy multifocal nonhemorrhagic infarcts in the left posterior inferior cerebellar artery territory. MRA of the brain showed a diminutive left vertebral artery which was occluded in the V4 segment was believed that the cause of the stroke. Carotid Doppler showed no significant extracranial stenosis transversely echo showed normal ejection fraction. LDL cholesterol of 95 mg percent and hemoglobin A1c is 5.7. Patient was enrolled in the stroke atrial fibrillation trial and was a randomized to the loop recorder insertion arm and got the procedure and so for A. fib has not been detected. He is been tolerating Plavix with some bruising but no major bleeding. He feels he has been for neurological recovery. His headaches have completely resolved. He is also tolerating Lipitor without muscle aches or pains. He states his blood pressure is usually quite good though today it is elevated at 156/90 and he blames this on having missed his morning blood  pressure medication dose. He has cut back smoking to 10 cigarettes per day but has not quit completely but states he will try to do so. He has kept his follow-up visits at the cardiovascular research clinic. UPDATE 11/28/2017CM [name], 75 year old male returns for follow-up. He has a history of small patchy multifocal nonhemorrhagic infarcts in the left posterior inferior cerebellar artery territory. He is currently on Plavix for secondary stroke prevention without further stroke or TIA symptoms. He has no bruising or bleeding. He remains on Lipitor and he denies myalgias. He continues to smoke but has cut back to approximately 10 cigarettes per day. He was encouraged to stop altogether. Blood pressure in the office today mildly elevated at 144/82. He claims he is exercising by walking. He remains in the cardiovascular research trial for atrial fib. He returns for reevaluation. He was 32 minutes late for his appointment today REVIEW OF SYSTEMS: Full 14 system review of systems performed and notable only for those listed, all others are neg:  Constitutional: neg  Cardiovascular: neg Ear/Nose/Throat: neg  Skin: neg Eyes: neg Respiratory: neg Gastroitestinal: neg  Hematology/Lymphatic: neg  Endocrine: neg Musculoskeletal:neg Allergy/Immunology: neg Neurological: neg Psychiatric: neg Sleep : neg   ALLERGIES: Allergies  Allergen Reactions  . Aspirin Other (See Comments)    Nosebleeds     HOME MEDICATIONS: Outpatient Medications Prior to Visit  Medication Sig Dispense Refill  . acetaminophen (TYLENOL) 500 MG tablet Take 2 tablets (1,000 mg total) by mouth every 6 (six) hours as needed for mild pain (or Fever >/= 101). 30 tablet 0  . atorvastatin (LIPITOR) 80 MG  tablet Take 1 tablet (80 mg total) by mouth daily. 90 tablet 1  . carvedilol (COREG) 3.125 MG tablet Take 3.125 mg by mouth 2 (two) times daily.    . clopidogrel (PLAVIX) 75 MG tablet Take 1 tablet (75 mg total) by mouth daily.  30 tablet 2  . finasteride (PROSCAR) 5 MG tablet Take 1 tablet (5 mg total) by mouth daily. 90 tablet 1  . fludrocortisone (FLORINEF) 0.1 MG tablet Take 2 tablets (0.2 mg total) by mouth daily in the morning. 180 tablet 1  . ipratropium (ATROVENT) 0.06 % nasal spray Place 2 sprays into the nose 3 (three) times daily. (Patient taking differently: Place 2 sprays into the nose daily as needed for rhinitis. ) 15 mL 2  . nitroGLYCERIN (NITROSTAT) 0.4 MG SL tablet Place 1 tablet (0.4 mg total) under the tongue every 5 (five) minutes x 3 doses as needed for chest pain. 30 tablet 3  . polyethylene glycol (MIRALAX) packet Take 17 g by mouth daily as needed (constipation). (Patient taking differently: Take 17 g by mouth every other day. ) 14 each 0  . sodium polystyrene (KAYEXALATE) 15 GM/60ML suspension Take 60 mLs (15 g total) by mouth every other day. 500 mL 0  . tamsulosin (FLOMAX) 0.4 MG CAPS capsule Take 1 capsule (0.4 mg total) by mouth daily after supper. 90 capsule 1   No facility-administered medications prior to visit.     PAST MEDICAL HISTORY: Past Medical History:  Diagnosis Date  . Chronic kidney disease (CKD), stage III (moderate)   . Daily headache    "lately" (04/17/2016)  . Depression   . Heart murmur   . High cholesterol   . Hyperkalemia 08/2014  . Hypertension   . PAD (peripheral artery disease) ([place])   . Stroke ([place]) 03/2016   hx of PAD; j"ust lots of headaches since" (04/17/2016)    PAST SURGICAL HISTORY: Past Surgical History:  Procedure Laterality Date  . EP IMPLANTABLE DEVICE N/A 03/04/2016   Procedure: Loop Recorder Insertion;  Surgeon: Thompson Grayer, MD;  Location: [facility];  Service: Cardiovascular;  Laterality: N/A;    FAMILY HISTORY: Family History  Problem Relation Age of Onset  . Hypertension Mother     died age 49  . Diabetes Mother     SOCIAL HISTORY: Social History   Social History  . Marital status: Single    Spouse name: N/A  . Number of  children: N/A  . Years of education: N/A   Occupational History  . Not on file.   Social History Main Topics  . Smoking status: Current Every Day Smoker    Packs/day: 0.50    Years: 66.00    Types: Cigarettes  . Smokeless tobacco: Never Used     Comment: cutting back. LESS THAN 1/2 PPD, smokes 8 to 10 a day  . Alcohol use No     Comment: 04/17/2016 "nothing in the last 3-4 years"  . Drug use: No  . Sexual activity: No   Other Topics Concern  . Not on file   Social History Narrative   Works in [place]:   11/11/16 0926  BP: (!) 144/82  Pulse: 83  Weight: 205 lb 6.4 oz (93.2 kg)  Height: 6\' 2"  (1.88 m)   Body mass index is 26.37 kg/m. General: well developed, well nourished elderly african american male, seated, in no evident distress Head: head normocephalic  and atraumatic.  Neck: supple with no carotid  bruits Cardiovascular: regular rate and rhythm, no murmurs Musculoskeletal: no deformity Skin:  no rash/petichiae Vascular:  Normal pulses all extremities  Neurological examination  Mental Status: Awake and fully alert. Oriented to place and time. Recent and remote memory intact. Attention span, concentration and fund of knowledge appropriate. Mood and affect appropriate.  Cranial Nerves: Fundoscopic exam not done. Pupils equal, briskly reactive to light. Extraocular movements full without nystagmus. Visual fields full to confrontation. Hearing intact. Facial sensation intact. Face, tongue, palate moves normally and symmetrically.  Motor: Normal bulk and tone. Normal strength in all tested extremity muscles. Sensory.: intact to touch ,pinprick .position and vibratory sensation in the upper and lower extremities.  Coordination: Rapid alternating movements normal in all extremities. Finger-to-nose and heel-to-shin performed accurately bilaterally. Gait and Station: Arises from chair without difficulty. Stance is normal. Gait demonstrates  normal stride length and balance . Able to heel, toe and tandem walk with slight difficulty. No assistive device Reflexes: 1+ and symmetric. Toes downgoing.    DIAGNOSTIC DATA (LABS, IMAGING, TESTING) - I reviewed patient records, labs, notes, testing and imaging myself where available.  Lab Results  Component Value Date   WBC 5.6 04/17/2016   HGB 11.9 (L) 04/17/2016   HCT 38.3 (L) 04/17/2016   MCV 95.0 04/17/2016   PLT 278 04/17/2016      Component Value Date/Time   NA 142 07/10/2016 0940   NA 145 (H) 05/14/2016 1037   K 4.5 07/10/2016 0940   CL 109 07/10/2016 0940   CO2 28 07/10/2016 0940   GLUCOSE 91 07/10/2016 0940   BUN 29 (H) 07/10/2016 0940   BUN 27 05/14/2016 1037   CREATININE 1.45 (H) 07/10/2016 0940   CREATININE 1.61 (H) 02/06/2015 1042   CALCIUM 9.1 07/10/2016 0940   PROT 7.8 03/02/2016 1815   ALBUMIN 3.9 03/02/2016 1815   AST 18 03/02/2016 1815   ALT 12 (L) 03/02/2016 1815   ALKPHOS 93 03/02/2016 1815   BILITOT 0.6 03/02/2016 1815   GFRNONAA 45 (L) 07/10/2016 0940   GFRNONAA 37 (L) 10/20/2014 0935   GFRAA 52 (L) 07/10/2016 0940   GFRAA 43 (L) 10/20/2014 0935   Lab Results  Component Value Date   CHOL 149 03/03/2016   HDL 41 03/03/2016   LDLCALC 95 03/03/2016   TRIG 65 03/03/2016   CHOLHDL 3.6 03/03/2016   Lab Results  Component Value Date   HGBA1C 5.7 (H) 03/03/2016      ASSESSMENT AND PLAN 40 year African-American male with left cerebellar infarct in March 2017 secondary to thrombotic occlusion of the left posterior inferior cerebellar artery. Multiple vascular risk factors of smoking, hypertension, hyperlipidemia, alcohol use. Patient is participating in the stroke atrial fibrillation trial and was randomized to the loop recorder insertion.The patient is a current patient of Dr. Leonie Man who is out of the office today . This note is sent to the work in doctor.      PLAN: Continue Plavix  for secondary stroke prevention maintain strict control of  hypertension with blood pressure goal below 130/90, today's reading 144/82 lipids with LDL cholesterol goal below 70 mg/dL. continue Lipitor eat a healthy diet with plenty of whole grains, cereals, fruits and vegetables,  exercise regularly by walking I advised him to quit smoking completely  He will continue follow-up in the cardiovascular research clinic for stroke A. fib trial F/U here in 6 months if stable will dismiss Dennie Bible, [place], [facility], APRN  [facility] [address], [address] [place], [place] [address] [phone] 

## 2016-11-20 ENCOUNTER — Encounter: Payer: Self-pay | Admitting: *Deleted

## 2016-11-20 ENCOUNTER — Telehealth: Payer: Self-pay | Admitting: *Deleted

## 2016-11-20 DIAGNOSIS — Z006 Encounter for examination for normal comparison and control in clinical research program: Secondary | ICD-10-CM

## 2016-11-20 NOTE — Telephone Encounter (Addendum)
Left message for patient to call research office about loop recorder and scheduling appointment to extend detection as instruction of Chanetta Marshall, NP (09/10/16).

## 2016-11-20 NOTE — Progress Notes (Signed)
STROKE-AF Research study month 9 manuel transmission completed.

## 2016-11-20 NOTE — Telephone Encounter (Signed)
STROKE-AF manuel transmission due. Called patient to instructed and inform that monitor has not been communicating with loop. Patient states he remembers how to do transmission and will do in 30 mins.

## 2016-12-02 ENCOUNTER — Telehealth: Payer: Self-pay | Admitting: *Deleted

## 2016-12-02 NOTE — Telephone Encounter (Signed)
Patient not here for research appointment, called cell number and could not leave voice message. Called home number which was neighbor and she will check on patient.

## 2016-12-09 ENCOUNTER — Other Ambulatory Visit: Payer: Self-pay | Admitting: Internal Medicine

## 2016-12-19 ENCOUNTER — Encounter: Payer: Medicare Other | Admitting: Internal Medicine

## 2017-01-23 ENCOUNTER — Encounter: Payer: Self-pay | Admitting: Cardiology

## 2017-01-26 ENCOUNTER — Other Ambulatory Visit: Payer: Self-pay | Admitting: *Deleted

## 2017-01-26 MED ORDER — CLOPIDOGREL BISULFATE 75 MG PO TABS: 75.0000 mg | ORAL_TABLET | Freq: Every day | ORAL | 2 refills | Status: DC

## 2017-02-05 ENCOUNTER — Telehealth: Payer: Self-pay | Admitting: Internal Medicine

## 2017-02-05 NOTE — Telephone Encounter (Signed)
APT. REMINDER CALL, LMTCB °

## 2017-02-06 ENCOUNTER — Ambulatory Visit (INDEPENDENT_AMBULATORY_CARE_PROVIDER_SITE_OTHER): Payer: Medicare Other | Admitting: Internal Medicine

## 2017-02-06 ENCOUNTER — Encounter (INDEPENDENT_AMBULATORY_CARE_PROVIDER_SITE_OTHER): Payer: Self-pay

## 2017-02-06 VITALS — BP 159/92 | HR 69 | Temp 97.4°F | Ht 74.0 in | Wt 203.3 lb

## 2017-02-06 DIAGNOSIS — F17210 Nicotine dependence, cigarettes, uncomplicated: Secondary | ICD-10-CM | POA: Diagnosis not present

## 2017-02-06 DIAGNOSIS — M79671 Pain in right foot: Secondary | ICD-10-CM | POA: Diagnosis not present

## 2017-02-06 DIAGNOSIS — L600 Ingrowing nail: Secondary | ICD-10-CM | POA: Diagnosis not present

## 2017-02-06 DIAGNOSIS — Z8679 Personal history of other diseases of the circulatory system: Secondary | ICD-10-CM

## 2017-02-06 DIAGNOSIS — M25471 Effusion, right ankle: Secondary | ICD-10-CM

## 2017-02-06 DIAGNOSIS — E2749 Other adrenocortical insufficiency: Secondary | ICD-10-CM | POA: Diagnosis not present

## 2017-02-06 DIAGNOSIS — Z79899 Other long term (current) drug therapy: Secondary | ICD-10-CM

## 2017-02-06 DIAGNOSIS — R600 Localized edema: Secondary | ICD-10-CM | POA: Diagnosis not present

## 2017-02-06 DIAGNOSIS — M79672 Pain in left foot: Secondary | ICD-10-CM

## 2017-02-06 DIAGNOSIS — I951 Orthostatic hypotension: Secondary | ICD-10-CM

## 2017-02-06 DIAGNOSIS — M25571 Pain in right ankle and joints of right foot: Secondary | ICD-10-CM

## 2017-02-06 MED ORDER — FUROSEMIDE 40 MG PO TABS: 40.0000 mg | ORAL_TABLET | Freq: Every day | ORAL | 0 refills | Status: DC

## 2017-02-06 NOTE — Patient Instructions (Signed)
It was a pleasure to see you today [name].  I think your feet are hurting and you cannot lie flat due to having too much fluid built up. For this start taking Lasix (furosemide) 40mg  once daily. This medicine will make you pass more urine which should help reduce this fluid. Take it early in the day since it will otherwise worsen needing to urinate at night.  We need to see you again in about a week to see if this is making progress on improving your swelling.  I am also referring you to a podiatrist to look at your toenails. They will call you about setting up this appointment.

## 2017-02-06 NOTE — Progress Notes (Signed)
   CC: Bilateral feet swelling  HPI:  Mr.Aidan Files is a 76 y.o. man here toady with increased bilateral foot swelling and pain particularly the right great toe.   See problem based assessment and plan below for additional details  Past Medical History:  Diagnosis Date  . Chronic kidney disease (CKD), stage III (moderate)   . Daily headache    "lately" (04/17/2016)  . Depression   . Heart murmur   . High cholesterol   . Hyperkalemia 08/2014  . Hypertension   . PAD (peripheral artery disease) ([place])   . Stroke ([place]) 03/2016   hx of PAD; j"ust lots of headaches since" (04/17/2016)    Review of Systems:  Review of Systems  Constitutional: Negative for fever.  Musculoskeletal: Positive for joint pain. Negative for falls.  Skin: Negative for rash.  Neurological: Negative for dizziness and focal weakness.    Physical Exam: Physical Exam  Constitutional: He appears distressed.  HENT:  Head: Atraumatic.  Cardiovascular: Normal rate and regular rhythm.   Pulmonary/Chest: Effort normal and breath sounds normal.  Musculoskeletal:  Bilateral pitting edema 2+ in feet and shins Normal MTP ROM although tender to direct palpation Severely ingrown R 1st toenail  Psychiatric: Affect normal.    Vitals:   02/06/17 1322 02/06/17 1359  BP: (!) 174/91 (!) 159/92  Pulse: 77 69  Temp: 97.4 F (36.3 C)   TempSrc: Oral   SpO2: 100%   Weight: 203 lb 4.8 oz (92.2 kg)   Height: 6\' 2"  (1.88 m)     Assessment & Plan:   See Encounters Tab for problem based charting.  Patient discussed with Dr. Angelia Mould

## 2017-02-07 LAB — BMP8+ANION GAP
Anion Gap: 18 mmol/L (ref 10.0–18.0)
BUN / CREAT RATIO: 13 (ref 10–24)
BUN: 19 mg/dL (ref 8–27)
CHLORIDE: 107 mmol/L — AB (ref 96–106)
CO2: 19 mmol/L (ref 18–29)
Calcium: 9.2 mg/dL (ref 8.6–10.2)
Creatinine, Ser: 1.41 mg/dL — ABNORMAL HIGH (ref 0.76–1.27)
GFR calc non Af Amer: 47 mL/min/{1.73_m2} — ABNORMAL LOW (ref >59)
GFR, EST AFRICAN AMERICAN: 55 mL/min/{1.73_m2} — AB (ref >59)
GLUCOSE: 107 mg/dL — AB (ref 65–99)
Potassium: 4.9 mmol/L (ref 3.5–5.2)
SODIUM: 144 mmol/L (ref 134–144)

## 2017-02-09 DIAGNOSIS — M79671 Pain in right foot: Secondary | ICD-10-CM

## 2017-02-09 HISTORY — DX: Pain in right foot: M79.671

## 2017-02-09 NOTE — Assessment & Plan Note (Signed)
Continuing fludrocortisone as above. Hopefully he does not need to take kayexalate regularly if he does get on a stable regimen with oral potassium wasting diuretics. Checking Bmet today.

## 2017-02-09 NOTE — Assessment & Plan Note (Signed)
HPI: He is no longer having any symptoms of orthostatic hypotension since stopping all diuretics and antihypertensive agents last year due to falls. However blood pressure is now highly uncontrolled over 180 SBP. No s/s of hypertensive urgency today.  A: Now asymptomatic but uncontrolled hypertension This may be exacerbated by his apparent volume overload today. He is also on fludrocortisone which would retain fluid but is also partly on due to his otherwise recurrent hyperkalemia.  P: Continue fludrocortisone today due to hypoaldosteronism but start daily lasix If he remains hypertensive after getting some volume off we can restart other medication

## 2017-02-09 NOTE — Assessment & Plan Note (Signed)
HPI: He has increasing pain and tenderness worsened by wearing shoes and walking around. This pain is relatively diffuse affecting his feet and ankles. He also has a severely ingrown toenail on the right 1st MTP that is very painful causing him trouble simply wearing shoes or lying down under covers at night.  A: Worsening lower extremity edema plus ingrown toenail Fortunately no evidence of local inflammation on physical examination. Joint exam is pretty benign and given the duration, progression, and symmetry of symptoms I am not suspicious for a crystalline arthropathy at this time. He has had significant weight gain during the past few months I suspect due mostly to fluid retention.  P: Diuresis with 40mg  lasix PO daily We will check a metabolic panel today Ambulatory referral to podiatry Close follow up RTC 1-2 weeks to reassess diuresis

## 2017-02-10 NOTE — Progress Notes (Signed)
Internal Medicine Clinic Attending  Case discussed with Dr. Benjamine Mola at the time of the visit.  We reviewed the resident's history and exam and pertinent patient test results.  I agree with the assessment, diagnosis, and plan of care documented in the resident's note. If lower extremity swelling remains a problem we will need to consider decreasing his dose of Florinef.

## 2017-02-13 ENCOUNTER — Ambulatory Visit (INDEPENDENT_AMBULATORY_CARE_PROVIDER_SITE_OTHER): Payer: Medicare Other | Admitting: Internal Medicine

## 2017-02-13 VITALS — BP 168/93 | HR 59 | Temp 98.2°F | Wt 202.5 lb

## 2017-02-13 DIAGNOSIS — L609 Nail disorder, unspecified: Secondary | ICD-10-CM

## 2017-02-13 DIAGNOSIS — F17210 Nicotine dependence, cigarettes, uncomplicated: Secondary | ICD-10-CM

## 2017-02-13 DIAGNOSIS — M79671 Pain in right foot: Secondary | ICD-10-CM | POA: Diagnosis not present

## 2017-02-13 DIAGNOSIS — M79672 Pain in left foot: Secondary | ICD-10-CM | POA: Diagnosis not present

## 2017-02-13 NOTE — Assessment & Plan Note (Signed)
Assessment: Patient was last seen in clinic on February 23 for bilateral foot pain. He was started on Lasix 40 mg daily for swelling. Patient states that his swelling has not improved. He sleeps in a recliner with his legs propped up and will have to place this feet on the ground due to foot pain at night. Elevating his legs does not help with swelling. He has a long smoking history and currently smokes. Possibly patient has claudication versus nephrotic syndrome with his elevated blood pressure and swelling.  Plan: Checking basic metabolic panel today. Continue Lasix 40 mg. Checking urinalysis, urine protein, urine creatinine, and ordered ABIs. He has an appointment with podiatry March 7th. Advised patient to take Tylenol for pain. If ABIs within normal limits can give patient compression stockings.

## 2017-02-13 NOTE — Progress Notes (Signed)
   CC: Bilateral foot pain  HPI:  [name] is a 76 y.o. with past medical history as outlined below who presents to clinic for bilateral foot pain. Please see problem list for further details of patient's chronic medical issues.  Past Medical History:  Diagnosis Date  . Chronic kidney disease (CKD), stage III (moderate)   . Daily headache    "lately" (04/17/2016)  . Depression   . Heart murmur   . High cholesterol   . Hyperkalemia 08/2014  . Hypertension   . PAD (peripheral artery disease) ([place])   . Stroke ([place]) 03/2016   hx of PAD; j"ust lots of headaches since" (04/17/2016)    Review of Systems:  Bilateral foot pain. Negative for dysuria or changes in urine.  Physical Exam:  Vitals:   02/13/17 1002  BP: (!) 168/93  Pulse: (!) 59  Temp: 98.2 F (36.8 C)  TempSrc: Oral  Weight: 202 lb 8 oz (91.9 kg)   Physical Exam  Constitutional: appears well-developed and well-nourished. No distress.  HENT:  Head: Normocephalic and atraumatic.  Nose: Nose normal.  Pulmonary/Chest: Effort normal and breath sounds normal. No respiratory distress.  has no wheezes.no rales.  Abdominal: Soft. Bowel sounds are normal.  exhibits no distension. There is no tenderness. There is no rebound and no guarding.  Skin: Hairless shiny skin on bilateral shins.  Extremities: 2+ pedal edema up to ankles, trace edema on bilateral shins. Dystrophic, thick toenails. Full range of motion of bilateral ankles, able to wiggle toes. Feet warm and well perfused.    Assessment & Plan:   See Encounters Tab for problem based charting.  Patient discussed with Dr. Lynnae January

## 2017-02-14 LAB — BMP8+ANION GAP
Anion Gap: 22 mmol/L — ABNORMAL HIGH (ref 10.0–18.0)
BUN/Creatinine Ratio: 25 — ABNORMAL HIGH (ref 10–24)
BUN: 37 mg/dL — AB (ref 8–27)
CO2: 20 mmol/L (ref 18–29)
Calcium: 9.6 mg/dL (ref 8.6–10.2)
Chloride: 98 mmol/L (ref 96–106)
Creatinine, Ser: 1.5 mg/dL — ABNORMAL HIGH (ref 0.76–1.27)
GFR calc non Af Amer: 44 mL/min/{1.73_m2} — ABNORMAL LOW (ref >59)
GFR, EST AFRICAN AMERICAN: 51 mL/min/{1.73_m2} — AB (ref >59)
Glucose: 120 mg/dL — ABNORMAL HIGH (ref 65–99)
Potassium: 4.6 mmol/L (ref 3.5–5.2)
SODIUM: 140 mmol/L (ref 134–144)

## 2017-02-14 LAB — URINALYSIS, COMPLETE
BILIRUBIN UA: NEGATIVE
Glucose, UA: NEGATIVE
Ketones, UA: NEGATIVE
NITRITE UA: NEGATIVE
PH UA: 8 — AB (ref 5.0–7.5)
RBC UA: NEGATIVE
Specific Gravity, UA: 1.015 (ref 1.005–1.030)
UUROB: 1 mg/dL (ref 0.2–1.0)

## 2017-02-14 LAB — PROTEIN,TOTAL,URINE: Protein, Ur: 166.6 mg/dL

## 2017-02-14 LAB — CREATININE, URINE, RANDOM: Creatinine, Urine: 103.3 mg/dL

## 2017-02-14 LAB — MICROSCOPIC EXAMINATION: CASTS: NONE SEEN /LPF

## 2017-02-17 ENCOUNTER — Other Ambulatory Visit: Payer: Self-pay | Admitting: Internal Medicine

## 2017-02-17 DIAGNOSIS — M25471 Effusion, right ankle: Secondary | ICD-10-CM

## 2017-02-17 DIAGNOSIS — M25571 Pain in right ankle and joints of right foot: Secondary | ICD-10-CM

## 2017-02-17 DIAGNOSIS — E2749 Other adrenocortical insufficiency: Secondary | ICD-10-CM

## 2017-02-17 NOTE — Progress Notes (Signed)
Internal Medicine Clinic Attending  Case discussed with Dr. Truong at the time of the visit.  We reviewed the resident's history and exam and pertinent patient test results.  I agree with the assessment, diagnosis, and plan of care documented in the resident's note.  

## 2017-02-18 ENCOUNTER — Ambulatory Visit (INDEPENDENT_AMBULATORY_CARE_PROVIDER_SITE_OTHER): Payer: Medicare Other | Admitting: Podiatry

## 2017-02-18 DIAGNOSIS — L603 Nail dystrophy: Secondary | ICD-10-CM

## 2017-02-18 DIAGNOSIS — L02611 Cutaneous abscess of right foot: Secondary | ICD-10-CM | POA: Diagnosis not present

## 2017-02-18 DIAGNOSIS — B351 Tinea unguium: Secondary | ICD-10-CM | POA: Diagnosis not present

## 2017-02-18 DIAGNOSIS — L608 Other nail disorders: Secondary | ICD-10-CM

## 2017-02-18 DIAGNOSIS — L03031 Cellulitis of right toe: Secondary | ICD-10-CM

## 2017-02-18 DIAGNOSIS — M79609 Pain in unspecified limb: Secondary | ICD-10-CM

## 2017-02-18 MED ORDER — DOXYCYCLINE HYCLATE 100 MG PO TABS: 100.0000 mg | ORAL_TABLET | Freq: Two times a day (BID) | ORAL | 0 refills | Status: DC

## 2017-02-18 NOTE — Telephone Encounter (Deleted)
He was started on lasix last month for edema and potassium level but needs to be reassessed

## 2017-02-21 LAB — WOUND CULTURE
Gram Stain: NONE SEEN
Gram Stain: NONE SEEN
Gram Stain: NONE SEEN

## 2017-02-24 NOTE — Progress Notes (Signed)
   Subjective:  76 year old male presents the office today for evaluation of right great toe nail pain. Patient states approximate 4 weeks ago his right great toe began to become extremely sore to touch. Patient denies trauma. Patient also complains of elongated, thickened, dystrophic nails 1-5 bilateral.    Objective/Physical Exam General: The patient is alert and oriented x3 in no acute distress.  Dermatology: Hyperkeratotic, elongated, dystrophic nails noted 12 through 5 bilateral. Skin is warm, dry and supple bilateral lower extremities. Negative for open lesions or macerations.  Vascular: Palpable pedal pulses bilaterally. No edema or erythema noted. Capillary refill within normal limits.  Neurological: Epicritic and protective threshold grossly intact bilaterally.   Musculoskeletal Exam: Significant pain on palpation noted to the nail plate of the right great toe. There appears to be purulent drainage noted underlying the nail plate. Range of motion within normal limits to all pedal and ankle joints bilateral. Muscle strength 5/5 in all groups bilateral.   Assessment: #1 purulent drainage underlying nail plate right great toe #2 localized abscess subungual right great toe #3 pain in right great toe #4 onychodystrophy nails 1-5 bilateral   Plan of Care:  #1 Patient was evaluated. #2 mechanical debridement of nails 1-5 bilaterally performed using a nail nipper without incident or bleeding. #3 today the decision was made for a total temporary nail avulsion of the right great toenail. Prior to procedure local anesthesia infiltration was utilized. Right great toenail was removed in an aseptic manner. Dry sterile dressing applied. There was a purulent amount of drainage noted. #4 cultures were taken #5 prescription for doxycycline 100 mg #6 return to clinic in 2 weeks    Edrick Kins, DPM [facility]  Dr. Edrick Kins, [address]                                         [place], [place] [address]                Office [phone]  Fax [phone]

## 2017-02-27 ENCOUNTER — Inpatient Hospital Stay (HOSPITAL_COMMUNITY): Payer: Medicare Other

## 2017-02-27 ENCOUNTER — Ambulatory Visit (INDEPENDENT_AMBULATORY_CARE_PROVIDER_SITE_OTHER): Payer: Medicare Other | Admitting: Internal Medicine

## 2017-02-27 ENCOUNTER — Inpatient Hospital Stay (HOSPITAL_COMMUNITY)
Admission: AD | Admit: 2017-02-27 | Discharge: 2017-03-07 | DRG: 253 | Disposition: A | Discharge status: Home / Self Care | Payer: Medicare Other | Source: Ambulatory Visit | Attending: Internal Medicine | Admitting: Internal Medicine

## 2017-02-27 ENCOUNTER — Encounter (HOSPITAL_COMMUNITY): Payer: Medicare Other

## 2017-02-27 VITALS — BP 153/62 | HR 80 | Temp 97.5°F | Ht 74.0 in | Wt 200.9 lb

## 2017-02-27 DIAGNOSIS — E7800 Pure hypercholesterolemia, unspecified: Secondary | ICD-10-CM | POA: Diagnosis present

## 2017-02-27 DIAGNOSIS — Z8673 Personal history of transient ischemic attack (TIA), and cerebral infarction without residual deficits: Secondary | ICD-10-CM

## 2017-02-27 DIAGNOSIS — M7989 Other specified soft tissue disorders: Secondary | ICD-10-CM

## 2017-02-27 DIAGNOSIS — M79674 Pain in right toe(s): Secondary | ICD-10-CM | POA: Diagnosis not present

## 2017-02-27 DIAGNOSIS — M62838 Other muscle spasm: Secondary | ICD-10-CM | POA: Diagnosis not present

## 2017-02-27 DIAGNOSIS — I70211 Atherosclerosis of native arteries of extremities with intermittent claudication, right leg: Principal | ICD-10-CM | POA: Diagnosis present

## 2017-02-27 DIAGNOSIS — N400 Enlarged prostate without lower urinary tract symptoms: Secondary | ICD-10-CM | POA: Diagnosis not present

## 2017-02-27 DIAGNOSIS — N185 Chronic kidney disease, stage 5: Secondary | ICD-10-CM | POA: Diagnosis present

## 2017-02-27 DIAGNOSIS — M79671 Pain in right foot: Secondary | ICD-10-CM | POA: Diagnosis not present

## 2017-02-27 DIAGNOSIS — F17200 Nicotine dependence, unspecified, uncomplicated: Secondary | ICD-10-CM | POA: Diagnosis present

## 2017-02-27 DIAGNOSIS — N183 Chronic kidney disease, stage 3 (moderate): Secondary | ICD-10-CM | POA: Diagnosis present

## 2017-02-27 DIAGNOSIS — M79609 Pain in unspecified limb: Secondary | ICD-10-CM | POA: Diagnosis not present

## 2017-02-27 DIAGNOSIS — Z79899 Other long term (current) drug therapy: Secondary | ICD-10-CM

## 2017-02-27 DIAGNOSIS — M79672 Pain in left foot: Secondary | ICD-10-CM | POA: Diagnosis not present

## 2017-02-27 DIAGNOSIS — I739 Peripheral vascular disease, unspecified: Secondary | ICD-10-CM | POA: Diagnosis present

## 2017-02-27 DIAGNOSIS — I129 Hypertensive chronic kidney disease with stage 1 through stage 4 chronic kidney disease, or unspecified chronic kidney disease: Secondary | ICD-10-CM | POA: Diagnosis present

## 2017-02-27 DIAGNOSIS — F329 Major depressive disorder, single episode, unspecified: Secondary | ICD-10-CM | POA: Diagnosis present

## 2017-02-27 DIAGNOSIS — Z886 Allergy status to analgesic agent status: Secondary | ICD-10-CM | POA: Diagnosis not present

## 2017-02-27 DIAGNOSIS — Z7982 Long term (current) use of aspirin: Secondary | ICD-10-CM | POA: Diagnosis not present

## 2017-02-27 DIAGNOSIS — I272 Pulmonary hypertension, unspecified: Secondary | ICD-10-CM | POA: Diagnosis present

## 2017-02-27 DIAGNOSIS — E2740 Unspecified adrenocortical insufficiency: Secondary | ICD-10-CM | POA: Diagnosis present

## 2017-02-27 DIAGNOSIS — N401 Enlarged prostate with lower urinary tract symptoms: Secondary | ICD-10-CM | POA: Diagnosis present

## 2017-02-27 DIAGNOSIS — N184 Chronic kidney disease, stage 4 (severe): Secondary | ICD-10-CM | POA: Diagnosis present

## 2017-02-27 DIAGNOSIS — Z9889 Other specified postprocedural states: Secondary | ICD-10-CM

## 2017-02-27 DIAGNOSIS — Z955 Presence of coronary angioplasty implant and graft: Secondary | ICD-10-CM | POA: Diagnosis not present

## 2017-02-27 DIAGNOSIS — L03031 Cellulitis of right toe: Secondary | ICD-10-CM

## 2017-02-27 DIAGNOSIS — I999 Unspecified disorder of circulatory system: Secondary | ICD-10-CM

## 2017-02-27 DIAGNOSIS — Z01810 Encounter for preprocedural cardiovascular examination: Secondary | ICD-10-CM | POA: Diagnosis not present

## 2017-02-27 DIAGNOSIS — Z7902 Long term (current) use of antithrombotics/antiplatelets: Secondary | ICD-10-CM | POA: Diagnosis not present

## 2017-02-27 DIAGNOSIS — M869 Osteomyelitis, unspecified: Secondary | ICD-10-CM | POA: Diagnosis present

## 2017-02-27 DIAGNOSIS — Z833 Family history of diabetes mellitus: Secondary | ICD-10-CM | POA: Diagnosis not present

## 2017-02-27 DIAGNOSIS — E785 Hyperlipidemia, unspecified: Secondary | ICD-10-CM | POA: Diagnosis not present

## 2017-02-27 DIAGNOSIS — F17210 Nicotine dependence, cigarettes, uncomplicated: Secondary | ICD-10-CM | POA: Diagnosis present

## 2017-02-27 DIAGNOSIS — K5900 Constipation, unspecified: Secondary | ICD-10-CM | POA: Diagnosis present

## 2017-02-27 DIAGNOSIS — R600 Localized edema: Secondary | ICD-10-CM | POA: Diagnosis not present

## 2017-02-27 DIAGNOSIS — N138 Other obstructive and reflux uropathy: Secondary | ICD-10-CM | POA: Diagnosis present

## 2017-02-27 DIAGNOSIS — I5030 Unspecified diastolic (congestive) heart failure: Secondary | ICD-10-CM | POA: Diagnosis not present

## 2017-02-27 DIAGNOSIS — I70219 Atherosclerosis of native arteries of extremities with intermittent claudication, unspecified extremity: Secondary | ICD-10-CM | POA: Diagnosis present

## 2017-02-27 DIAGNOSIS — Z8249 Family history of ischemic heart disease and other diseases of the circulatory system: Secondary | ICD-10-CM | POA: Diagnosis not present

## 2017-02-27 DIAGNOSIS — M86171 Other acute osteomyelitis, right ankle and foot: Secondary | ICD-10-CM | POA: Diagnosis not present

## 2017-02-27 DIAGNOSIS — B9689 Other specified bacterial agents as the cause of diseases classified elsewhere: Secondary | ICD-10-CM | POA: Diagnosis not present

## 2017-02-27 LAB — BASIC METABOLIC PANEL
Anion gap: 9 (ref 5–15)
BUN: 31 mg/dL — AB (ref 6–20)
CO2: 26 mmol/L (ref 22–32)
Calcium: 9.3 mg/dL (ref 8.9–10.3)
Chloride: 107 mmol/L (ref 101–111)
Creatinine, Ser: 1.69 mg/dL — ABNORMAL HIGH (ref 0.61–1.24)
GFR, EST AFRICAN AMERICAN: 43 mL/min — AB (ref >60)
GFR, EST NON AFRICAN AMERICAN: 37 mL/min — AB (ref >60)
Glucose, Bld: 113 mg/dL — ABNORMAL HIGH (ref 65–99)
Potassium: 4.8 mmol/L (ref 3.5–5.1)
SODIUM: 142 mmol/L (ref 135–145)

## 2017-02-27 LAB — SEDIMENTATION RATE: Sed Rate: 53 mm/hr — ABNORMAL HIGH (ref 0–16)

## 2017-02-27 LAB — CBC
HCT: 37.4 % — ABNORMAL LOW (ref 39.0–52.0)
Hemoglobin: 11.8 g/dL — ABNORMAL LOW (ref 13.0–17.0)
MCH: 29.8 pg (ref 26.0–34.0)
MCHC: 31.6 g/dL (ref 30.0–36.0)
MCV: 94.4 fL (ref 78.0–100.0)
PLATELETS: 312 10*3/uL (ref 150–400)
RBC: 3.96 MIL/uL — ABNORMAL LOW (ref 4.22–5.81)
RDW: 15.2 % (ref 11.5–15.5)
WBC: 9 10*3/uL (ref 4.0–10.5)

## 2017-02-27 LAB — C-REACTIVE PROTEIN: CRP: 2.7 mg/dL — AB (ref <1.0)

## 2017-02-27 MED ORDER — ONDANSETRON HCL 4 MG/2ML IJ SOLN
4.0000 mg | Freq: Four times a day (QID) | INTRAMUSCULAR | Status: DC | PRN
  Administered 2017-03-04: 4 mg via INTRAVENOUS
  Filled 2017-02-27: qty 2

## 2017-02-27 MED ORDER — FLUDROCORTISONE ACETATE 0.1 MG PO TABS
0.2000 mg | ORAL_TABLET | Freq: Every day | ORAL | Status: DC
  Administered 2017-02-28 – 2017-03-06 (×6): 0.2 mg via ORAL
  Filled 2017-02-27 (×8): qty 2

## 2017-02-27 MED ORDER — POLYETHYLENE GLYCOL 3350 17 G PO PACK
17.0000 g | PACK | Freq: Every day | ORAL | Status: DC
  Administered 2017-02-27 – 2017-03-01 (×3): 17 g via ORAL
  Filled 2017-02-27 (×4): qty 1

## 2017-02-27 MED ORDER — ONDANSETRON HCL 4 MG PO TABS: 4.0000 mg | ORAL_TABLET | Freq: Four times a day (QID) | ORAL | Status: DC | PRN

## 2017-02-27 MED ORDER — TAMSULOSIN HCL 0.4 MG PO CAPS
0.4000 mg | ORAL_CAPSULE | Freq: Every day | ORAL | Status: DC
  Administered 2017-02-27 – 2017-03-06 (×8): 0.4 mg via ORAL
  Filled 2017-02-27 (×8): qty 1

## 2017-02-27 MED ORDER — HYDROMORPHONE HCL 2 MG/ML IJ SOLN
1.0000 mg | Freq: Once | INTRAMUSCULAR | Status: AC
  Administered 2017-02-27: 1 mg via INTRAVENOUS
  Filled 2017-02-27: qty 1

## 2017-02-27 MED ORDER — HEPARIN (PORCINE) IN NACL 100-0.45 UNIT/ML-% IJ SOLN
1700.0000 [IU]/h | INTRAMUSCULAR | Status: DC
  Administered 2017-02-27 – 2017-02-28 (×2): 1200 [IU]/h via INTRAVENOUS
  Administered 2017-03-01 – 2017-03-02 (×2): 1500 [IU]/h via INTRAVENOUS
  Filled 2017-02-27 (×5): qty 250

## 2017-02-27 MED ORDER — CLOPIDOGREL BISULFATE 75 MG PO TABS
75.0000 mg | ORAL_TABLET | Freq: Every day | ORAL | Status: DC
  Administered 2017-02-28 – 2017-03-07 (×6): 75 mg via ORAL
  Filled 2017-02-27 (×7): qty 1

## 2017-02-27 MED ORDER — ATORVASTATIN CALCIUM 80 MG PO TABS
80.0000 mg | ORAL_TABLET | Freq: Every day | ORAL | Status: DC
  Administered 2017-02-28 – 2017-03-07 (×7): 80 mg via ORAL
  Filled 2017-02-27 (×7): qty 1

## 2017-02-27 MED ORDER — HEPARIN BOLUS VIA INFUSION
4000.0000 [IU] | Freq: Once | INTRAVENOUS | Status: AC
  Administered 2017-02-27: 4000 [IU] via INTRAVENOUS
  Filled 2017-02-27: qty 4000

## 2017-02-27 MED ORDER — FINASTERIDE 5 MG PO TABS
5.0000 mg | ORAL_TABLET | Freq: Every day | ORAL | Status: DC
  Administered 2017-02-28 – 2017-03-07 (×7): 5 mg via ORAL
  Filled 2017-02-27 (×7): qty 1

## 2017-02-27 MED ORDER — HYDROCODONE-ACETAMINOPHEN 5-325 MG PO TABS: 1.0000 | ORAL_TABLET | ORAL | Status: DC | PRN

## 2017-02-27 MED ORDER — HYDROCODONE-ACETAMINOPHEN 5-325 MG PO TABS
1.0000 | ORAL_TABLET | Freq: Four times a day (QID) | ORAL | Status: DC | PRN
  Administered 2017-02-27 – 2017-03-03 (×5): 1 via ORAL
  Filled 2017-02-27 (×5): qty 1

## 2017-02-27 NOTE — Assessment & Plan Note (Signed)
His history today is concerning for ischemic limb given his severe distal limb pain that is positional - worse when laying flat and improves when dangling his feet over the bed. Pulses are non palpable on the right and undetectable with doppler. Evaluation with ultrasound revealed significantly reduced blood flow on that side. Patient will need an ischemic work up with ABIs and likely a vascular surgery evaluation. He also has residual infection of his right great toe despite PO antibiotics. He was seen by podiatry one week ago and had his right great toenail removed and a purulent abscess drained under the nail. Despite BID doxycycline, he has persistent erythema and significant pain with light palpation of that toe. There is some minimal non purulent drainage. Osteomyelitis is a possibility, however the infection may be lingering due to poor blood flow to the area. He may ultimately require IV antibiotics. Lastly, patient has bilaterally lower extremity edema with unclear etiology. The swelling started about three weeks ago and is worse on the right. Labs are inconsistent with nephrotic syndrome. He has been taking lasix 40 BID without relief. Due to the severity of the swelling and the associated pain, DVT should be ruled out with lower extremity doppler. Given our concern for ischemic limb and need for extensive work up, the decision was made to admit to the hospital for further evaluation. -- Admit to IM teaching service  -- F/u CBC, BMP, CRP, SED rate checked today in clinic -- Recommend: ABIs, lower extremity doppler (r/o DVT), right foot xray (r/o osteomyelitis), and vascular surgery consult pending results  -- Consider increasing lasix dose, IV antibiotics if indicated

## 2017-02-27 NOTE — Progress Notes (Signed)
Internal Medicine Clinic Attending  I saw and evaluated the patient.  I personally confirmed the key portions of the history and exam documented by Dr. Philipp Ovens and I reviewed pertinent patient test results.  The assessment, diagnosis, and plan were formulated together and I agree with the documentation in the resident's note.  76 year old man, current smoker, came to clinic for worsening right foot pain over the last one week. Says the pain is in distal toes and is throbbing all the time. It is worse when he elevates the leg, he can't lie down flat because of the pain, and it improves with sitting up and dangling his foot over the bed. One week ago Dr. Amalia Hailey with podiatry diagnosed an infection under the first nail bed, removed the toenail, and gave doxycycline with no improvement in his symptoms. On exam his foot is significant edematous with 2+ dorsal foot edema, 1+ pretibial edema. I cannot feel a DP pulse, and we could not detect the pulse with doppler. I could not express any purulence from the great toe.   Symptoms and exam are high risk for severe peripheral arterial . We are going admit the patient for urgent vascular evaluation. I would heparinize him for now until we can assess ABIs and have vascular surgery evaluate. Would also do a doppler study of the right foot to rule out acute DVT given the significant swelling. Please check an xray of the foot to eval for osteomyelitis changes. ESR is pending.

## 2017-02-27 NOTE — Progress Notes (Signed)
ANTICOAGULATION CONSULT NOTE - Initial Consult  Pharmacy Consult for Heparin Indication: limb ischemia  Allergies  Allergen Reactions  . Aspirin Other (See Comments)    Nosebleeds     Patient Measurements: Height: 6\' 2"  (188 cm) Weight: 199 lb (90.3 kg) IBW/kg (Calculated) : 82.2   Vital Signs: Temp: 97.5 F (36.4 C) (03/16 1421) Temp Source: Oral (03/16 1421) BP: 149/74 (03/16 1421) Pulse Rate: 72 (03/16 1421)  Labs:  Recent Labs  02/27/17 1225  HGB 11.8*  HCT 37.4*  PLT 312  CREATININE 1.69*    Estimated Creatinine Clearance: 41.9 mL/min (A) (by C-G formula based on SCr of 1.69 mg/dL (H)).   Medical History: Past Medical History:  Diagnosis Date  . Chronic kidney disease (CKD), stage III (moderate)   . Daily headache    "lately" (04/17/2016)  . Depression   . Heart murmur   . High cholesterol   . Hyperkalemia 08/2014  . Hypertension   . PAD (peripheral artery disease) ([place])   . Stroke ([place]) 03/2016   hx of PAD; j"ust lots of headaches since" (04/17/2016)     Assessment: 76 year old male to begin heparin for limb ischemia  Goal of Therapy:  Heparin level 0.3-0.7 units/ml Monitor platelets by anticoagulation protocol: Yes   Plan:  Heparin 4000 units iv bolus x 1 Heparin drip at 1200 units / hr Heparin level 8 hours after heparin begins Daily heparin level, CBC  Thank you Anette Guarneri, PharmD [phone]  02/27/2017,2:59 PM

## 2017-02-27 NOTE — Progress Notes (Signed)
*  PRELIMINARY RESULTS* Vascular Ultrasound Lower extremity venous duplex has been completed.  Preliminary findings: No evidence of DVT or baker's cyst.  ARTERIAL  ABI completed: Technically limited due to venous interferance and patient movement. Right ABI demonstrates severe disease. Left ABI appears within normal limits.    RIGHT    LEFT    PRESSURE WAVEFORM  PRESSURE WAVEFORM  BRACHIAL 146 Tri BRACHIAL 146 Tri         AT 67 Damp mono AT 126 Mono  PT 70 Damp mono PT 136 Mono                  RIGHT LEFT  ABI 0.48 0.95     Landry Mellow, RDMS, RVT   02/27/2017, 4:50 PM

## 2017-02-27 NOTE — Progress Notes (Signed)
   CC: Foot pain, swelling   HPI:  [name] is a 76 y.o. M with past medical history outlined below here for follow up of his lower extremity swelling and pain. Patient reports the pain and swelling started about three weeks ago. He has been seen in clinic twice now for this same issue. He was started on Lasix 40 mg daily which is has been taking consistently without improvement in the swelling. At his initial visit, he was complaining of pain in his right great toe and noted to have an ingrown toenail. He was referred to podiatry and see in their clinic on the 7th of this month. Patient had his right great toenail removed and found to have a purulent abscess beneath the nail that was drained and sent for culture. He was started on BID doxycycline. Cultures ultimately resulted positive for citrobacter. He has had no improvement in his toe pain despite PO antibiotics. He was last seen in our clinic on 02/13/2017. UA at that time was notable for 3+ proteinuria and there was some concern for nephrotic syndrome, but urine creatinine and protein levels are not consistent. ABIs were also ordered which were never completed. Today, he is completely of severe pain in his distal toes, worse on the right. The pain is very severe at night when he lays flat and improves when he dangles his legs over the bed. Swelling is unchanged. For further details of today's visit please refer to the assessment and plan.  Past Medical History:  Diagnosis Date  . Chronic kidney disease (CKD), stage III (moderate)   . Daily headache    "lately" (04/17/2016)  . Depression   . Heart murmur   . High cholesterol   . Hyperkalemia 08/2014  . Hypertension   . PAD (peripheral artery disease) ([place])   . Stroke ([place]) 03/2016   hx of PAD; j"ust lots of headaches since" (04/17/2016)    Review of Systems:  All pertinents listed in HPI, otherwise negative  Physical Exam:  Vitals:   02/27/17 1058  BP: (!) 153/62  Pulse: 80    Temp: 97.5 F (36.4 C)  TempSrc: Oral  SpO2: 100%  Weight: 200 lb 14.4 oz (91.1 kg)  Height: 6\' 2"  (1.88 m)    Constitutional: NAD, appears comfortable Cardiovascular: RRR  Pulmonary/Chest: CTAB Extremities: Bilateral non pitting edema of the feet. Lower extremities are warm. Pulses are non palpable on the right and undetectable with doppler. Evaluation with ultrasound showed significantly diminished flow on the right.  Neurological: A&Ox3, CN II - XII grossly intact.   Assessment & Plan:   See Encounters Tab for problem based charting.  Patient seen with Dr. Evette Doffing

## 2017-02-27 NOTE — Progress Notes (Signed)
Report give to [place] on [address].  Patient is alert and oriented.  Transported via wheelchair to [place]

## 2017-02-27 NOTE — H&P (Signed)
Date: 02/27/2017               Patient Name:  Derrick Hendrix MRN: [id number]  DOB: 06/27/1941 Age / Sex: 76 y.o., male   PCP: [name], MD         Medical Service: Internal Medicine Teaching Service         Attending Physician: Dr. Truman Hayward, MD    First Contact: Dr. Einar Gip Pager: [phone]  Second Contact: Dr. Ignacia Marvel Pager: [phone]       After Hours (After 5p/  First Contact Pager: [phone]  weekends / holidays): Second Contact Pager: [phone]   Chief Complaint: bilateral LE pain and swelling.  History of Present Illness:   Derrick Hendrix is a 76 y/o M with Mhx significant for PAD, hx of CVA without residual deficit, current tobacco abuse, HTN and  HLD who was directly admitted from clinic today due to concerns for limb ischemia. He reports a 3 week history of progressive right foot pain and swelling and a 2 week history of left foot pain and swelling. Pain is worse when he's laying down however improves when he dangles his feet off the bed. He has been having to sleep in a recliner to keep his legs in this position. In clinic, extremities were cold, R>L, pulses were not appreciable on exam and blood flow was not appreciated on doppler. Denies any trauma however had an infection under his right great toenail. For this, his toenail was removed 1 week ago by his podiatrist and an abscess was drained. He was on oral antibiotics however does not know the name. He describes a long history of right calf claudication which has worsened over the years so that he can only walk about 1 block before developing significant pain, which is relieved by rest. ABI's in 2015 showed moderate disease of the right LE. He endorses feeling cold occasionally however denies any fever, chest pain, shortness of breath, recent travel, knee pain or recent surgeries. Denies any history of blood clots.   Meds:  Current Meds  Medication Sig  . atorvastatin (LIPITOR) 80 MG tablet Take  1 tablet (80 mg total) by mouth daily.  . clopidogrel (PLAVIX) 75 MG tablet Take 1 tablet (75 mg total) by mouth daily.  [name] doxycycline (VIBRA-TABS) 100 MG tablet Take 1 tablet (100 mg total) by mouth 2 (two) times daily.  . ergocalciferol (VITAMIN D2) 50000 units capsule Take 50,000 Units by mouth once a week.  . finasteride (PROSCAR) 5 MG tablet Take 1 tablet (5 mg total) by mouth daily.  . fludrocortisone (FLORINEF) 0.1 MG tablet Take 2 tablets (0.2 mg total) by mouth daily in the morning.  . furosemide (LASIX) 40 MG tablet TAKE 1 TABLET (40 MG TOTAL) BY MOUTH DAILY.  . tamsulosin (FLOMAX) 0.4 MG CAPS capsule Take 1 capsule (0.4 mg total) by mouth daily after supper.   Allergies: Allergies as of 02/27/2017 - Review Complete 02/27/2017  Allergen Reaction Noted  . Aspirin Other (See Comments) 08/24/2014   Past Medical History:  Diagnosis Date  . Chronic kidney disease (CKD), stage III (moderate)   . Daily headache    "lately" (04/17/2016)  . Depression   . Heart murmur   . High cholesterol   . Hyperkalemia 08/2014  . Hypertension   . PAD (peripheral artery disease) ([place])   . Stroke ([place]) 03/2016   hx of PAD; j"ust lots of headaches since" (04/17/2016)   Family  History: Mother: Deceased, hx of HTN and diabetes. Father: Deceased, MI.  Social History: Current daily smoker, approx 1/2 pack per day x 60+ years. Denied any recent alcohol use or drug use. He lives alone.   Review of Systems: A complete ROS was negative except as per HPI.   Physical Exam: Blood pressure (!) 149/74, pulse 72, temperature 97.5 F (36.4 C), temperature source Oral, resp. rate 17, height 6\' 2"  (1.88 m), weight 199 lb (90.3 kg), SpO2 100 %. General: Chronically-ill appearing african [place] male. In no acute distress. Resting in bed with right LE hanging off edge of bed.  HENT: PERRL. EOMI. Scleral icterus present bilaterally. No conjunctival injection, icterus or ptosis. Oropharynx clear, mucous membranes  moist.  Cardiovascular: Regular rate and rhythm. No murmur or rub appreciated. Pulmonary: CTA BL, no wheezing, crackles or rhonchi appreciated. Unlabored breathing.  Abdomen: Soft, non-tender and non-distended. No guarding or rigidity. +bowel sounds.  Extremities: BL LE peripheral edema, R>L. Right great toe without toenail - as pictured below. No appreciable distal pulses BL. LE cold, R>L. Sensation intact bilaterally.  Skin: No cyanosis. Cold BL LE extremities.  Neuro: Strength and sensation grossly intact BL. Moves all 4 limbs spontaneously.  Psych: Mood normal and affect was mood congruent. Responds to questions appropriately however tangentially.      Right Foot x-ray: No osteomyelitis appreciated however did demonstrate dorsal forefoot soft tissue swelling.  Assessment & Plan by Problem: Principal Problem:   Severe peripheral arterial disease (HCC) Active Problems:   CKD (chronic kidney disease), stage III   Current smoker   Atherosclerosis of leg with intermittent claudication, R   Constipation   Benign prostatic hyperplasia with urinary obstruction   Hyperlipemia  Severe Peripheral Arterial Disease: On Plavix and ASA already due to history of stroke. He has a long-standing history of peripheral arterial disease as demonstrated on ABI's in 2015 with associated long claudication in right leg. His pain used to be relieved with rest however now has pain at rest. He has intact motor and sensory function of BL LE despite his diminished pulses and edema. He has been seen by [facility] who do not believe there is currently an indication for emergent intervention however he will require additional imaging including arteriogram or even intervention early next week. -Neurovascular checks Q4H as patient at risk for critical limb ischemia  -VVS on, continue to appreciate their recommendations  -DVT study without evidence for thrombus -ABI's show severe disease in right LE. Left ABI normal -Norco  Q6H PRN -CBC, BMET tomorrow AM  History of CVA: No residual deficit per patient. On Plavix, aspirin and Atorvastatin.  History of BPH: Have continued Flomax 0.4 mg and Proscar 5 mg daily  History of hyporeninemic hypoaldosteronism: Will continue fludrocortisone 0.2mg  daily  HLD: Continuing Atorvastatin 80 mg  Consults: VVS Diet: HH, carb mod IVF: None DVT Prophylaxis: Heparin Code status: FULL. This was discussed with the patient on admission. He would like his sister contacted if he were not able to make decisions on his own.   Dispo: Admit patient to Inpatient with expected length of stay greater than 2 midnights.  SignedEinar Gip, DO 02/27/2017, 3:58 PM  Pager: [phone]

## 2017-02-27 NOTE — Consult Note (Signed)
[facility] [place] [facility]  Patient name: Derrick Hendrix MRN: [id number] DOB: November 27, 1941 Sex: male  REASON FOR CONSULT: severe right leg pain, consult is from internal medicine teaching service.   HPI: Derrick Hendrix is a 76 y.o. male, who presents with three week history of worsening right great toe pain. He states that his right great toe throbs intermittently. He denies any injury to this toe. His pain improves with standing up. It is worse with laying in bed. One week ago, he was seen by Dr. Amalia Hailey, DPM for right great toe nail pain. His right great toe was removed and there is a localized abscess with purulent drainage underlying the nail plate of the right foot. He continues to complain of pain in his right toe. He denies any fever or chills.   The patient describes claudication on the right walking less than one block. This has been going on for years. Resting alleviates his calf pain. His claudication has worsened over the years, as he used to be able to walk 2-3 blocks before having to stop. He denies any rest pain of the left foot. He occasionally will have calf cramping on the left.   The patient lives at home alone. He smokes approximately 8 cigarettes a day for the past 15 years. He denies any illicit drug use.   He has a PMH of CVA without residual deficits back in March 2017. He had a loop recorder implanted during workup that revealed some PACs but no atrial fibrillation.   Past Medical History:  Diagnosis Date  . Chronic kidney disease (CKD), stage III (moderate)   . Daily headache    "lately" (04/17/2016)  . Depression   . Heart murmur   . High cholesterol   . Hyperkalemia 08/2014  . Hypertension   . PAD (peripheral artery disease) ([place])   . Stroke ([place]) 03/2016   hx of PAD; j"ust lots of headaches since" (04/17/2016)    Family History  Problem Relation Age of Onset  . Hypertension Mother     died age 45  . Diabetes Mother     SOCIAL HISTORY: Social  History   Social History  . Marital status: Single    Spouse name: N/A  . Number of children: N/A  . Years of education: N/A   Occupational History  . Not on file.   Social History Main Topics  . Smoking status: Current Every Day Smoker    Packs/day: 0.50    Years: 66.00    Types: Cigarettes  . Smokeless tobacco: Never Used     Comment: cutting back. LESS THAN 1/2 PPD, smokes 8 to 10 a day  . Alcohol use No     Comment: 04/17/2016 "nothing in the last 3-4 years"  . Drug use: No  . Sexual activity: No   Other Topics Concern  . Not on file   Social History Narrative   Works in the yard          Allergies  Allergen Reactions  . Aspirin Other (See Comments)    Nosebleeds     Current Facility-Administered Medications  Medication Dose Route Frequency Provider Last Rate Last Dose  . heparin ADULT infusion 100 units/mL (25000 units/22mL sodium chloride 0.45%)  1,200 Units/hr Intravenous Continuous Truman Hayward, MD      . heparin bolus via infusion 4,000 Units  4,000 Units Intravenous Once Truman Hayward, MD      . HYDROcodone-acetaminophen (NORCO/VICODIN) 5-325 MG per  tablet 1-2 tablet  1-2 tablet Oral Q4H PRN Rushil Sherrye Payor, MD      . ondansetron (ZOFRAN) tablet 4 mg  4 mg Oral Q6H PRN Rushil Sherrye Payor, MD       Or  . ondansetron (ZOFRAN) injection 4 mg  4 mg Intravenous Q6H PRN Rushil Sherrye Payor, MD      . polyethylene glycol (MIRALAX / GLYCOLAX) packet 17 g  17 g Oral Daily Rushil Sherrye Payor, MD   Stopped at 02/27/17 1523    REVIEW OF SYSTEMS:  [X]  denotes positive finding, [ ]  denotes negative finding Cardiac  Comments:  Chest pain or chest pressure:    Shortness of breath upon exertion:    Short of breath when lying flat:    Irregular heart rhythm:        Vascular    Pain in calf, thigh, or hip brought on by ambulation:    Pain in feet at night that wakes you up from your sleep:     Blood clot in your veins:    Leg swelling:  x       Pulmonary    Oxygen  at home:    Productive cough:     Wheezing:         Neurologic    Sudden weakness in arms or legs:     Sudden numbness in arms or legs:     Sudden onset of difficulty speaking or slurred speech:    Temporary loss of vision in one eye:     Problems with dizziness:         Gastrointestinal    Blood in stool:     Vomited blood:         Genitourinary    Burning when urinating:     Blood in urine:        Psychiatric    Major depression:         Hematologic    Bleeding problems:    Problems with blood clotting too easily:        Skin    Rashes or ulcers:        Constitutional    Fever or chills:      PHYSICAL EXAM: Vitals:   02/27/17 1421  BP: (!) 149/74  Pulse: 72  Resp: 17  Temp: 97.5 F (36.4 C)  TempSrc: Oral  SpO2: 100%  Weight: 199 lb (90.3 kg)  Height: 6\' 2"  (1.88 m)    GENERAL: The patient is a well-nourished male, in no acute distress. The vital signs are documented above. CARDIAC: There is a regular rate and rhythm. No carotid bruits.  VASCULAR: 2+ radial pulses bilaterally, 2+ femoral pulses bilaterally. Right foot slightly cooler than left but still warm and perfused.  PULMONARY: There is good air exchange bilaterally without wheezing or rales. ABDOMEN: Soft and non-tender.  MUSCULOSKELETAL: Darkening to tips of right great toe, right 2nd, 4th and 5th toes. Right foot is swollen. Right leg is slightly more swollen than left.  NEUROLOGIC: Motor and sensory function intact to feet bilaterally.  SKIN: Right great toe missing. No purulence or fluctuance seen.  PSYCHIATRIC: The patient has a normal affect.  DATA:  ABIs and venous duplex pending  Review of ABIs and lower extremity arterial duplex from 06/28/14 showed:  R: 0.71 with dampened monophasic DP and PT waveforms L: 1.14 with biphasic DP and triphasic PT  MEDICAL ISSUES: Atherosclerosis native arteries of right lower extremity with rest pain  The patient has  chronic peripheral arterial disease  based on prior ABIs in 2015 and long history of claudication in the right leg. This has now progressed to rest pain in the right foot. He has no history of cardiac arrhythmia to suggest acute embolus. He has intact motor and sensory function of the right foot.  He also has some darkening of the right great toe, 2nd, 4th and 5th toes. Did discuss that this is a potentially limb threatening situation. No indication for emergent intervention. He will need an arteriogram with limited dye versus CO2 given chronic kidney disease and possible intervention early next week. Will need IV hydration prior to procedure. If he has disease amenable to angioplasty or stenting, will perform at time of procedure, otherwise may need bypass. If he is discharged this weekend, can arrange as an outpatient.   Virgina Jock, PA-C [facility]   I agree with the above.  I have seen and evaluated the patient.  He has a right great toe wound.  The ABI on the right is 0.48 and 0.95 on the left.  He has palpable femoral pulses.  His creatinine is slightly elevated at 1.6.  I discussed with the patient that I will recommend angiography on Monday.  This will be done through a left femoral approach.  We may have to use CO2 depending on what his renal function is.  I would recommend IV hydration over the weekend.  He will need to be nothing by mouth after midnight on Sunday.  I discussed with him this is a limb threatening situation.  Annamarie Major

## 2017-02-28 ENCOUNTER — Encounter (HOSPITAL_COMMUNITY): Payer: Self-pay | Admitting: *Deleted

## 2017-02-28 DIAGNOSIS — E2740 Unspecified adrenocortical insufficiency: Secondary | ICD-10-CM

## 2017-02-28 DIAGNOSIS — I999 Unspecified disorder of circulatory system: Secondary | ICD-10-CM

## 2017-02-28 DIAGNOSIS — F17210 Nicotine dependence, cigarettes, uncomplicated: Secondary | ICD-10-CM

## 2017-02-28 DIAGNOSIS — N400 Enlarged prostate without lower urinary tract symptoms: Secondary | ICD-10-CM

## 2017-02-28 DIAGNOSIS — E785 Hyperlipidemia, unspecified: Secondary | ICD-10-CM

## 2017-02-28 DIAGNOSIS — M79674 Pain in right toe(s): Secondary | ICD-10-CM

## 2017-02-28 DIAGNOSIS — Z8673 Personal history of transient ischemic attack (TIA), and cerebral infarction without residual deficits: Secondary | ICD-10-CM

## 2017-02-28 DIAGNOSIS — Z886 Allergy status to analgesic agent status: Secondary | ICD-10-CM

## 2017-02-28 DIAGNOSIS — L03031 Cellulitis of right toe: Secondary | ICD-10-CM

## 2017-02-28 DIAGNOSIS — Z7902 Long term (current) use of antithrombotics/antiplatelets: Secondary | ICD-10-CM

## 2017-02-28 DIAGNOSIS — M79671 Pain in right foot: Secondary | ICD-10-CM

## 2017-02-28 DIAGNOSIS — Z833 Family history of diabetes mellitus: Secondary | ICD-10-CM

## 2017-02-28 DIAGNOSIS — Z8249 Family history of ischemic heart disease and other diseases of the circulatory system: Secondary | ICD-10-CM

## 2017-02-28 DIAGNOSIS — Z7982 Long term (current) use of aspirin: Secondary | ICD-10-CM

## 2017-02-28 DIAGNOSIS — N183 Chronic kidney disease, stage 3 (moderate): Secondary | ICD-10-CM

## 2017-02-28 LAB — CBC
HCT: 35 % — ABNORMAL LOW (ref 39.0–52.0)
Hemoglobin: 11 g/dL — ABNORMAL LOW (ref 13.0–17.0)
MCH: 29.3 pg (ref 26.0–34.0)
MCHC: 31.4 g/dL (ref 30.0–36.0)
MCV: 93.3 fL (ref 78.0–100.0)
PLATELETS: 290 10*3/uL (ref 150–400)
RBC: 3.75 MIL/uL — AB (ref 4.22–5.81)
RDW: 14.8 % (ref 11.5–15.5)
WBC: 7.1 10*3/uL (ref 4.0–10.5)

## 2017-02-28 LAB — HEPARIN LEVEL (UNFRACTIONATED)
HEPARIN UNFRACTIONATED: 0.21 [IU]/mL — AB (ref 0.30–0.70)
Heparin Unfractionated: 0.37 IU/mL (ref 0.30–0.70)
Heparin Unfractionated: 0.29 IU/mL — ABNORMAL LOW (ref 0.30–0.70)

## 2017-02-28 LAB — BASIC METABOLIC PANEL
Anion gap: 10 (ref 5–15)
BUN: 29 mg/dL — ABNORMAL HIGH (ref 6–20)
CHLORIDE: 104 mmol/L (ref 101–111)
CO2: 25 mmol/L (ref 22–32)
CREATININE: 1.61 mg/dL — AB (ref 0.61–1.24)
Calcium: 8.5 mg/dL — ABNORMAL LOW (ref 8.9–10.3)
GFR calc non Af Amer: 39 mL/min — ABNORMAL LOW (ref >60)
GFR, EST AFRICAN AMERICAN: 46 mL/min — AB (ref >60)
Glucose, Bld: 78 mg/dL (ref 65–99)
POTASSIUM: 4.2 mmol/L (ref 3.5–5.1)
SODIUM: 139 mmol/L (ref 135–145)

## 2017-02-28 MED ORDER — SODIUM CHLORIDE 0.9 % IV SOLN
INTRAVENOUS | Status: DC
  Administered 2017-02-28 – 2017-03-02 (×3): via INTRAVENOUS

## 2017-02-28 NOTE — Progress Notes (Addendum)
  [facility] Progress Note  Subjective   Right toe hurts  Objective Vitals:   02/27/17 2118 02/28/17 0425  BP: 134/77 (!) 143/71  Pulse: 69 77  Resp:    Temp: 97.9 F (36.6 C) 98.2 F (36.8 C)    Intake/Output Summary (Last 24 hours) at 02/28/17 1018 Last data filed at 02/28/17 0304  Gross per 24 hour  Intake            655.4 ml  Output              150 ml  Net            505.4 ml   Palpable femoral pulses bilaterally Right great toe with toenail removed. No gangrene, no purulence.   Assessment/Planning: 76 y.o. male with wound right great toe and right rest pain.   Plan for lower extremity arteriogram Monday with possible intervention. Will limit contrast administration.  Creatinine 1.6 today. Will hydrate and recheck creatinine tomorrow.   Alvia Grove 02/28/2017 10:18 AM --  Laboratory CBC    Component Value Date/Time   WBC 7.1 02/28/2017 0533   HGB 11.0 (L) 02/28/2017 0533   HCT 35.0 (L) 02/28/2017 0533   PLT 290 02/28/2017 0533    BMET    Component Value Date/Time   NA 139 02/28/2017 0533   NA 140 02/13/2017 1055   K 4.2 02/28/2017 0533   CL 104 02/28/2017 0533   CO2 25 02/28/2017 0533   GLUCOSE 78 02/28/2017 0533   BUN 29 (H) 02/28/2017 0533   BUN 37 (H) 02/13/2017 1055   CREATININE 1.61 (H) 02/28/2017 0533   CREATININE 1.61 (H) 02/06/2015 1042   CALCIUM 8.5 (L) 02/28/2017 0533   GFRNONAA 39 (L) 02/28/2017 0533   GFRNONAA 37 (L) 10/20/2014 0935   GFRAA 46 (L) 02/28/2017 0533   GFRAA 43 (L) 10/20/2014 0935    COAG No results found for: INR, PROTIME No results found for: PTT  Antibiotics Anti-infectives    None       Virgina Jock, PA-C [facility] Office: [phone] Pager: [phone] 02/28/2017 10:18 AM  I agree with the above.  We will plan on angiography Monday to define the vascular disease in his right leg.  He'll need to be hydrated over the weekend so as to minimize the risk  for renal toxicity with contrast administration.  He will need to be nothing by mouth after midnight on Sunday.  Annamarie Major

## 2017-02-28 NOTE — Progress Notes (Signed)
Subjective: Mr. Winterton was seen and evaluated today at bedside. No acute complaints overnight except for right toe pain and fatigue. Reports pain has improved since admission. Requests discharge.   Objective:  Vital signs in last 24 hours: Vitals:   02/27/17 1421 02/27/17 2118 02/28/17 0425  BP: (!) 149/74 134/77 (!) 143/71  Pulse: 72 69 77  Resp: 17    Temp: 97.5 F (36.4 C) 97.9 F (36.6 C) 98.2 F (36.8 C)  TempSrc: Oral Oral Oral  SpO2: 100% 98% 91%  Weight: 199 lb (90.3 kg)    Height: 6' 2" (1.88 m)     General: In no acute distress. Resting comfortably in bed. Tired.  HENT:EOMI. Scleral icterus. Cardiovascular: Regular rate and rhythm. No murmur or rub appreciated. Pulmonary: Unlabored breathing but diminished throughout. No focal consolidation appreciated.  Abdomen: Soft, non-tender. No guarding. +bowel sounds.  Extremities: BL LE warmer and perfusion appears improved! Right foot still with edema however improved. Sensation intact. Skin: Warm, dry. No cyanosis.  Psych: Mood normal and affect was mood congruent. Responds to questions appropriately.   Assessment/Plan:  Principal Problem:   Severe peripheral arterial disease (HCC) Active Problems:   CKD (chronic kidney disease), stage III   Current smoker   Atherosclerosis of leg with intermittent claudication, R   Constipation   Benign prostatic hyperplasia with urinary obstruction   Hyperlipemia  Severe peripheral arterial disease: As evidenced by ABI (R=0.48) performed yesterday. He was seen by [facility]VS who do not feel he is an emergent surgical case however who do want to do further work-up with LE arteriogram Monday with possible intervention. On exam, his lower extremity is much more perfused than yesterday however he continues to have prominent right foot edema. Right great toe without purulence, erythema or point tenderness. ESR somewhat elevated however foot x-ray without evidence for osteo. As clinical suspicion  for overt osteomyelitis is low, and he will have advanced imaging on Monday, will hold off on MRI at this time.  -VVS on board, continue to appreciate their recs. They plan for arteriogram Monday with possible intervention -Neurovascular checks Q12H -Statin, ASA, Plavix -heparin -Pain control with Norvo/Vicodin -Wound care for right great toe -BMET and CBC tomorrow  Stage 3 CKD: Will need to hydrate patient well prior to contrast load necessary for vascular imaging on Monday. Currently receiving gentle rehydration with NS at 75 mL/hr. Will follow renal function and volume status.   BPH: Continue proscar and flomax  Dispo: Anticipated discharge depending on vascular intervention.   Alven Alverio, DO 02/28/2017, 11:28 AM Pager: [phone]

## 2017-02-28 NOTE — Progress Notes (Signed)
ANTICOAGULATION CONSULT NOTE - Follow Up Consult  Pharmacy Consult for Heparin  Indication: Ischemic limb  Patient Measurements: Height: 6\' 2"  (188 cm) Weight: 199 lb (90.3 kg) IBW/kg (Calculated) : 82.2  Heparin dosing weight - 90.3 kg Vital Signs: Temp: 98.5 F (36.9 C) (03/17 1257) Temp Source: Oral (03/17 1257) BP: 145/72 (03/17 1257) Pulse Rate: 75 (03/17 1257)  Labs:  Recent Labs  02/27/17 1225 02/28/17 0038 02/28/17 0533 02/28/17 1025 02/28/17 1903  HGB 11.8*  --  11.0*  --   --   HCT 37.4*  --  35.0*  --   --   PLT 312  --  290  --   --   HEPARINUNFRC  --  0.37  --  0.29* 0.21*  CREATININE 1.69*  --  1.61*  --   --     Estimated Creatinine Clearance: 44 mL/min (A) (by C-G formula based on SCr of 1.61 mg/dL (H)).  Assessment: Started on heparin for possible limb ischemia. Not on anticoagulation PTA.  Heparin level has trended down to 0.21 despite rate increase to heparin to 1300 units/h. H/H remain stable. Platelets are within normal limits. No bleeding or IV line issues per discussion with RN.  Per Vascular note today, plan for LE arteriogram on Monday with possible intervention.   Goal of Therapy:  Heparin level 0.3-0.7 units/ml Monitor platelets by anticoagulation protocol: Yes   Plan:  -Increase heparin to 1500 units/hr -Reckeck level in 8 hours -Daily heparin level/CBC -Monitor for s/sx bleeding -F/U vascular plans   Elicia Lamp, PharmD, BCPS Clinical Pharmacist 02/28/2017 7:49 PM

## 2017-02-28 NOTE — Progress Notes (Signed)
ANTICOAGULATION CONSULT NOTE - Follow Up Consult  Pharmacy Consult for Heparin  Indication: Ischemic limb  Patient Measurements: Height: 6\' 2"  (188 cm) Weight: 199 lb (90.3 kg) IBW/kg (Calculated) : 82.2 Vital Signs: Temp: 97.9 F (36.6 C) (03/16 2118) Temp Source: Oral (03/16 2118) BP: 134/77 (03/16 2118) Pulse Rate: 69 (03/16 2118)  Labs:  Recent Labs  02/27/17 1225 02/28/17 0038  HGB 11.8*  --   HCT 37.4*  --   PLT 312  --   HEPARINUNFRC  --  0.37  CREATININE 1.69*  --     Estimated Creatinine Clearance: 41.9 mL/min (A) (by C-G formula based on SCr of 1.69 mg/dL (H)).  Assessment: Started on heparin for possible limb ischemia, initial heparin level is therapeutic   Goal of Therapy:  Heparin level 0.3-0.7 units/ml Monitor platelets by anticoagulation protocol: Yes   Plan:  -Cont heparin 1200 units/hr -1000 HL -F/U vascular plans  Narda Bonds 02/28/2017,1:20 AM

## 2017-02-28 NOTE — Progress Notes (Addendum)
ANTICOAGULATION CONSULT NOTE - Follow Up Consult  Pharmacy Consult for Heparin  Indication: Ischemic limb  Patient Measurements: Height: 6\' 2"  (188 cm) Weight: 199 lb (90.3 kg) IBW/kg (Calculated) : 82.2  Heparin dosing weight - 90.3 kg Vital Signs: Temp: 98.2 F (36.8 C) (03/17 0425) Temp Source: Oral (03/17 0425) BP: 143/71 (03/17 0425) Pulse Rate: 77 (03/17 0425)  Labs:  Recent Labs  02/27/17 1225 02/28/17 0038 02/28/17 0533 02/28/17 1025  HGB 11.8*  --  11.0*  --   HCT 37.4*  --  35.0*  --   PLT 312  --  290  --   HEPARINUNFRC  --  0.37  --  0.29*  CREATININE 1.69*  --  1.61*  --     Estimated Creatinine Clearance: 44 mL/min (A) (by C-G formula based on SCr of 1.61 mg/dL (H)).  Assessment: Started on heparin for possible limb ischemia.  Initial heparin level was therapeutic. Now heparin level has trended down slightly. H/H remain stable. Platelets are within normal limits. No bleeding reported.   Per Vascular note today, plan for LE arteriogram on Monday with possible intervention.   Goal of Therapy:  Heparin level 0.3-0.7 units/ml Monitor platelets by anticoagulation protocol: Yes   Plan:  -Increase heparin to 1300  units/hr -Reckeck level in 8 hours -F/U vascular plans  Sloan Leiter, PharmD, BCPS Clinical Pharmacist Clinical phone [phone] until 3:30 PM 02/28/2017  After hours, please call #[phone] 02/28/2017,11:34 AM

## 2017-03-01 LAB — HEPARIN LEVEL (UNFRACTIONATED)
Heparin Unfractionated: 0.66 IU/mL (ref 0.30–0.70)
Heparin Unfractionated: 0.59 [IU]/mL (ref 0.30–0.70)

## 2017-03-01 LAB — CBC
HCT: 35.9 % — ABNORMAL LOW (ref 39.0–52.0)
Hemoglobin: 11.4 g/dL — ABNORMAL LOW (ref 13.0–17.0)
MCH: 29.5 pg (ref 26.0–34.0)
MCHC: 31.8 g/dL (ref 30.0–36.0)
MCV: 92.8 fL (ref 78.0–100.0)
Platelets: 263 K/uL (ref 150–400)
RBC: 3.87 MIL/uL — ABNORMAL LOW (ref 4.22–5.81)
RDW: 14.8 % (ref 11.5–15.5)
WBC: 7.6 K/uL (ref 4.0–10.5)

## 2017-03-01 LAB — BASIC METABOLIC PANEL WITH GFR
Anion gap: 11 (ref 5–15)
BUN: 26 mg/dL — ABNORMAL HIGH (ref 6–20)
CO2: 25 mmol/L (ref 22–32)
Calcium: 8.2 mg/dL — ABNORMAL LOW (ref 8.9–10.3)
Chloride: 105 mmol/L (ref 101–111)
Creatinine, Ser: 1.59 mg/dL — ABNORMAL HIGH (ref 0.61–1.24)
GFR calc Af Amer: 46 mL/min — ABNORMAL LOW
GFR calc non Af Amer: 40 mL/min — ABNORMAL LOW
Glucose, Bld: 138 mg/dL — ABNORMAL HIGH (ref 65–99)
Potassium: 4 mmol/L (ref 3.5–5.1)
Sodium: 141 mmol/L (ref 135–145)

## 2017-03-01 LAB — PROTEIN / CREATININE RATIO, URINE
CREATININE, URINE: 41.54 mg/dL
PROTEIN CREATININE RATIO: 1.06 mg/mg{creat} — AB (ref 0.00–0.15)
TOTAL PROTEIN, URINE: 44 mg/dL

## 2017-03-01 NOTE — Progress Notes (Signed)
ANTICOAGULATION CONSULT NOTE - Follow Up Consult  Pharmacy Consult for Heparin  Indication: Ischemic limb  Patient Measurements: Height: 6\' 2"  (188 cm) Weight: 199 lb (90.3 kg) IBW/kg (Calculated) : 82.2  Heparin dosing weight - 90.3 kg Vital Signs: Temp: 97.7 F (36.5 C) (03/18 1347) Temp Source: Oral (03/18 1347) BP: 165/75 (03/18 1347) Pulse Rate: 79 (03/18 1347)  Labs:  Recent Labs  02/27/17 1225  02/28/17 0533  02/28/17 1903 03/01/17 0516 03/01/17 1231  HGB 11.8*  --  11.0*  --   --  11.4*  --   HCT 37.4*  --  35.0*  --   --  35.9*  --   PLT 312  --  290  --   --  263  --   HEPARINUNFRC  --   < >  --   < > 0.21* 0.66 0.59  CREATININE 1.69*  --  1.61*  --   --  1.59*  --   < > = values in this interval not displayed.  Estimated Creatinine Clearance: 44.5 mL/min (A) (by C-G formula based on SCr of 1.59 mg/dL (H)).  Assessment: Started on heparin for possible limb ischemia. Not on anticoagulation PTA.  Heparin level remains therapeutic at 0.59 after confirmatory recheck. H/H remain stable. Platelets are within normal limits. No bleeding or IV line issues per discussion with RN.  Per Vascular note on 3/17, plan for LE arteriogram on Monday with possible intervention.   Goal of Therapy:  Heparin level 0.3-0.7 units/ml Monitor platelets by anticoagulation protocol: Yes   Plan:  -Continue heparin to 1500 units/hr -Daily heparin level/CBC -Monitor for s/sx bleeding -F/U vascular plans - timing of procedure and when to hold heparin  Sloan Leiter, PharmD, BCPS Clinical Pharmacist Clinical Phone 03/01/2017 until 3:30 PM - #[phone] After hours, please call #[phone] 03/01/2017 2:24 PM

## 2017-03-01 NOTE — Consult Note (Signed)
[facility] Nurse wound consult note Reason for Consult:Edema and tenderness to right great toe. Nail bed has been removed, but is healed and dry.  NPO after midnight tonight for revascularization.  Will keep this toe covered with dry dressing and await revascularization.  Wound type:Vascular insufficiency to right great toe.  Pressure Injury POA: N/A Measurement: erythema and edema to right great toe.  Reports tenderness Wound VPX:TGGY bed removed  Pink and nongranulating Drainage (amount, consistency, odor) None today.  Periwound:Edema Dressing procedure/placement/frequency:Cleanse right great toe with soap and water and pat dry.  Apply dry gauze and tape.  Awaiting vascular procedure Monday 03/02/17 Will not follow at this time.  Please re-consult if needed.  Domenic Moras RN BSN [place] Pager [phone]

## 2017-03-01 NOTE — Progress Notes (Signed)
Patient refusing to have his NS ran at this time. He states that the pump beeps often. RN offered to move IV to a less positional spot. Patient refuses to be stuck again. He request IV be stopped "now". RN did  However instruct patient she  will need to keep the heparin running that is currenlyt infusing on module # 2. He is agreeable

## 2017-03-01 NOTE — Progress Notes (Signed)
ANTICOAGULATION CONSULT NOTE - Follow Up Consult  Pharmacy Consult for Heparin  Indication: Ischemic limb  Patient Measurements: Height: 6\' 2"  (188 cm) Weight: 199 lb (90.3 kg) IBW/kg (Calculated) : 82.2  Heparin dosing weight - 90.3 kg Vital Signs: Temp: 97.7 F (36.5 C) (03/18 0441) Temp Source: Oral (03/18 0441) BP: 150/95 (03/18 0441) Pulse Rate: 79 (03/18 0441)  Labs:  Recent Labs  02/27/17 1225  02/28/17 0533 02/28/17 1025 02/28/17 1903 03/01/17 0516  HGB 11.8*  --  11.0*  --   --  11.4*  HCT 37.4*  --  35.0*  --   --  35.9*  PLT 312  --  290  --   --  263  HEPARINUNFRC  --   < >  --  0.29* 0.21* 0.66  CREATININE 1.69*  --  1.61*  --   --  1.59*  < > = values in this interval not displayed.  Estimated Creatinine Clearance: 44.5 mL/min (A) (by C-G formula based on SCr of 1.59 mg/dL (H)).  Assessment: Started on heparin for possible limb ischemia. Not on anticoagulation PTA.  Heparin level is therapeutic at 0.66 *upper end of goal. H/H remain stable. Platelets are within normal limits. No bleeding or IV line issues per discussion with RN.  Per Vascular note on 3/17, plan for LE arteriogram on Monday with possible intervention.   Goal of Therapy:  Heparin level 0.3-0.7 units/ml Monitor platelets by anticoagulation protocol: Yes   Plan:  -Continue heparin to 1500 units/hr -Reckeck level in 8 hours to rule out accumulation -Daily heparin level/CBC -Monitor for s/sx bleeding -F/U vascular plans - timing of procedure and when to hold heparin  Sloan Leiter, PharmD, BCPS Clinical Pharmacist Clinical Phone 03/01/2017 until 3:30 PM - #[phone] After hours, please call #[phone] 03/01/2017 7:46 AM

## 2017-03-01 NOTE — Progress Notes (Signed)
   Subjective: Derrick Hendrix feels better today although his ankle and foot pain continue intermittently. He describes a shooting pain sensation in the bottom of his foot and his ankle aches with mild tenderness to touch. His foot remains warmer than on arrival but still edematous. He is on continued IV fluids for hydration prior to anticipated contrast study tomorrow. He is anxious to get out of the hospital and take care of his home and cats but wants to stay for full workup.  Objective:  Vital signs in last 24 hours: Vitals:   02/28/17 1257 02/28/17 2141 03/01/17 0441 03/01/17 1347  BP: (!) 145/72 (!) 164/76 (!) 150/95 (!) 165/75  Pulse: 75 73 79 79  Resp: '18 20 18 17  '$ Temp: 98.5 F (36.9 C) 98.7 F (37.1 C) 97.7 F (36.5 C) 97.7 F (36.5 C)  TempSrc: Oral Oral Oral Oral  SpO2: 97% 97% 98% 100%  Weight:      Height:       General: In no acute distress. Resting comfortably in bed. HENT: EOMI. Conjunctivae noninjected, moist oral mucosa. Cardiovascular: Regular rate and rhythm. No murmur or rub appreciated. Pulmonary: CTAB, normal WOB. Extremities: right foot edematous with 2+ pitting over the dorsum and lateral ankle, no warmth or erythema, no toenail on 1st digit, ROM is intact and sensation is grossly intact, pulses not readily palpable Skin: Warm, dry. No cyanosis.  Psych: Mood normal and affect was mood congruent.  Assessment/Plan:  Severe peripheral arterial disease: As evidenced by ABI (R=0.48) performed Saturday 3/16. He was seen by [facility]VS who do not feel he is an emergent surgical case however who do want to do further work-up with LE arteriogram Monday with possible intervention. He has substantial edema in the right foot without any evidence of active infection. His ESR was somewhat elevated but moderate and xray not very concerning although this is low sensitivity for osteomyelitis. He is planned for arteriography tomorrow and does not appear acutely ill from an infection so  we will hold off on MRI today. I will also need to clarify regarding his status in monitoring for the stroke-Afib trial with implantable loop recorder - I do not see documentation since the 9 month follow up in December. -[facility]VS on board, continue to appreciate their recs. They plan for arteriogram Monday with possible intervention -Neurovascular checks Q12H -Continuing Statin, ASA, Plavix -heparin gtt for peripheral ischemia -Pain control with Norvo/Vicodin -BMET and CBC tomorrow AM  Stage 3 CKD: Hydrating patient well prior to contrast load necessary for vascular imaging on Monday with NS at 75 mL/hr. Foot edema is isolated and has not changed much. He remains euvolemic without much change in SCr so we are probably near his baseline.  BPH: Continue proscar and flomax. He has no acute complaints here of retention.  Dispo: Discharge plan depending on vascular intervention.    [name], MD PGY-II Internal Medicine Resident Pager# [phone] 03/01/2017, 3:21 PM

## 2017-03-01 NOTE — Progress Notes (Addendum)
  [facility] Progress Note  Subjective    Didn't sleep well due to IV beeping last night.   Objective Vitals:   02/28/17 2141 03/01/17 0441  BP: (!) 164/76 (!) 150/95  Pulse: 73 79  Resp: 20 18  Temp: 98.7 F (37.1 C) 97.7 F (36.5 C)    Intake/Output Summary (Last 24 hours) at 03/01/17 0936 Last data filed at 03/01/17 0442  Gross per 24 hour  Intake            518.3 ml  Output             1100 ml  Net           -581.7 ml   Right great toe wound clean  Assessment/Planning: 76 y.o. male with right foot wound and right rest pain  CKD: refused IV fluids.  Discussed importance of IV hydration and patient now agreeable. Will continue IV hydration at 75 ml/hr NPO past midnight for arteriogram and possible intervention tomorrow.   Derrick Hendrix 03/01/2017 9:36 AM --  Laboratory CBC    Component Value Date/Time   WBC 7.6 03/01/2017 0516   HGB 11.4 (L) 03/01/2017 0516   HCT 35.9 (L) 03/01/2017 0516   PLT 263 03/01/2017 0516    BMET    Component Value Date/Time   NA 141 03/01/2017 0516   NA 140 02/13/2017 1055   K 4.0 03/01/2017 0516   CL 105 03/01/2017 0516   CO2 25 03/01/2017 0516   GLUCOSE 138 (H) 03/01/2017 0516   BUN 26 (H) 03/01/2017 0516   BUN 37 (H) 02/13/2017 1055   CREATININE 1.59 (H) 03/01/2017 0516   CREATININE 1.61 (H) 02/06/2015 1042   CALCIUM 8.2 (L) 03/01/2017 0516   GFRNONAA 40 (L) 03/01/2017 0516   GFRNONAA 37 (L) 10/20/2014 0935   GFRAA 46 (L) 03/01/2017 0516   GFRAA 43 (L) 10/20/2014 0935    COAG No results found for: INR, PROTIME No results found for: PTT  Antibiotics Anti-infectives    None       Derrick Jock, PA-C [facility] Office: [phone] Pager: [phone] 03/01/2017 9:36 AM    Agree with the above.  Needs IV hydration.  Anticipate angiogram tomorrow  Derrick Hendrix

## 2017-03-02 ENCOUNTER — Encounter (HOSPITAL_COMMUNITY)
Admission: AD | Disposition: A | Discharge status: Home / Self Care | Payer: Self-pay | Source: Ambulatory Visit | Attending: Infectious Disease

## 2017-03-02 DIAGNOSIS — I70211 Atherosclerosis of native arteries of extremities with intermittent claudication, right leg: Secondary | ICD-10-CM

## 2017-03-02 HISTORY — PX: ABDOMINAL AORTOGRAM W/LOWER EXTREMITY: CATH118223

## 2017-03-02 LAB — BASIC METABOLIC PANEL
Anion gap: 11 (ref 5–15)
BUN: 22 mg/dL — AB (ref 6–20)
CHLORIDE: 104 mmol/L (ref 101–111)
CO2: 24 mmol/L (ref 22–32)
CREATININE: 1.35 mg/dL — AB (ref 0.61–1.24)
Calcium: 8.4 mg/dL — ABNORMAL LOW (ref 8.9–10.3)
GFR calc Af Amer: 56 mL/min — ABNORMAL LOW (ref >60)
GFR, EST NON AFRICAN AMERICAN: 49 mL/min — AB (ref >60)
Glucose, Bld: 77 mg/dL (ref 65–99)
POTASSIUM: 4.3 mmol/L (ref 3.5–5.1)
Sodium: 139 mmol/L (ref 135–145)

## 2017-03-02 LAB — CBC
HCT: 35.7 % — ABNORMAL LOW (ref 39.0–52.0)
HEMATOCRIT: 38 % — AB (ref 39.0–52.0)
Hemoglobin: 11.2 g/dL — ABNORMAL LOW (ref 13.0–17.0)
Hemoglobin: 12.1 g/dL — ABNORMAL LOW (ref 13.0–17.0)
MCH: 29 pg (ref 26.0–34.0)
MCH: 29.3 pg (ref 26.0–34.0)
MCHC: 31.4 g/dL (ref 30.0–36.0)
MCHC: 31.8 g/dL (ref 30.0–36.0)
MCV: 92.5 fL (ref 78.0–100.0)
MCV: 92 fL (ref 78.0–100.0)
PLATELETS: 265 10*3/uL (ref 150–400)
Platelets: 250 10*3/uL (ref 150–400)
RBC: 3.86 MIL/uL — AB (ref 4.22–5.81)
RBC: 4.13 MIL/uL — AB (ref 4.22–5.81)
RDW: 14.5 % (ref 11.5–15.5)
RDW: 14.5 % (ref 11.5–15.5)
WBC: 8.3 10*3/uL (ref 4.0–10.5)
WBC: 7.2 10*3/uL (ref 4.0–10.5)

## 2017-03-02 LAB — HEPARIN LEVEL (UNFRACTIONATED): Heparin Unfractionated: <0.1 IU/mL — ABNORMAL LOW (ref 0.30–0.70)

## 2017-03-02 LAB — CREATININE, SERUM
Creatinine, Ser: 1.36 mg/dL — ABNORMAL HIGH (ref 0.61–1.24)
GFR calc non Af Amer: 48 mL/min — ABNORMAL LOW (ref >60)
GFR, EST AFRICAN AMERICAN: 56 mL/min — AB (ref >60)

## 2017-03-02 LAB — POCT ACTIVATED CLOTTING TIME: Activated Clotting Time: 175 seconds

## 2017-03-02 SURGERY — ABDOMINAL AORTOGRAM W/LOWER EXTREMITY
Anesthesia: LOCAL

## 2017-03-02 MED ORDER — SENNA 8.6 MG PO TABS
2.0000 | ORAL_TABLET | Freq: Every day | ORAL | Status: DC
  Administered 2017-03-04 – 2017-03-07 (×3): 17.2 mg via ORAL
  Filled 2017-03-02 (×5): qty 2

## 2017-03-02 MED ORDER — POLYETHYLENE GLYCOL 3350 17 G PO PACK: 17.0000 g | PACK | Freq: Every day | ORAL | Status: DC | PRN

## 2017-03-02 MED ORDER — HYDRALAZINE HCL 20 MG/ML IJ SOLN
10.0000 mg | Freq: Once | INTRAMUSCULAR | Status: AC
  Administered 2017-03-02: 10 mg via INTRAVENOUS

## 2017-03-02 MED ORDER — LIDOCAINE HCL (PF) 1 % IJ SOLN
INTRAMUSCULAR | Status: DC | PRN
  Administered 2017-03-02: 10 mL

## 2017-03-02 MED ORDER — ENOXAPARIN SODIUM 40 MG/0.4ML ~~LOC~~ SOLN
40.0000 mg | SUBCUTANEOUS | Status: DC
  Administered 2017-03-03 – 2017-03-07 (×4): 40 mg via SUBCUTANEOUS
  Filled 2017-03-02 (×4): qty 0.4

## 2017-03-02 MED ORDER — HEPARIN (PORCINE) IN NACL 2-0.9 UNIT/ML-% IJ SOLN
INTRAMUSCULAR | Status: DC | PRN
  Administered 2017-03-02: 1000 mL via INTRA_ARTERIAL

## 2017-03-02 MED ORDER — VITAMIN D (ERGOCALCIFEROL) 1.25 MG (50000 UNIT) PO CAPS
50000.0000 [IU] | ORAL_CAPSULE | ORAL | Status: DC
  Filled 2017-03-02: qty 1

## 2017-03-02 MED ORDER — IODIXANOL 320 MG/ML IV SOLN
INTRAVENOUS | Status: DC | PRN
  Administered 2017-03-02: 97 mL via INTRA_ARTERIAL

## 2017-03-02 MED ORDER — HYDRALAZINE HCL 20 MG/ML IJ SOLN
10.0000 mg | INTRAMUSCULAR | Status: DC | PRN
  Administered 2017-03-03 – 2017-03-06 (×2): 10 mg via INTRAVENOUS
  Filled 2017-03-02 (×2): qty 1

## 2017-03-02 MED ORDER — ACETAMINOPHEN 325 MG PO TABS
650.0000 mg | ORAL_TABLET | ORAL | Status: DC | PRN
  Administered 2017-03-04: 650 mg via ORAL
  Filled 2017-03-02: qty 2

## 2017-03-02 MED ORDER — BISACODYL 10 MG RE SUPP
10.0000 mg | Freq: Once | RECTAL | Status: AC
  Administered 2017-03-02: 10 mg via RECTAL
  Filled 2017-03-02: qty 1

## 2017-03-02 MED ORDER — HYDRALAZINE HCL 20 MG/ML IJ SOLN: 10.0000 mg | Freq: Once | INTRAMUSCULAR | Status: DC

## 2017-03-02 MED ORDER — HEPARIN (PORCINE) IN NACL 2-0.9 UNIT/ML-% IJ SOLN
INTRAMUSCULAR | Status: AC
  Filled 2017-03-02: qty 1000

## 2017-03-02 MED ORDER — LIDOCAINE HCL (PF) 1 % IJ SOLN
INTRAMUSCULAR | Status: AC
  Filled 2017-03-02: qty 30

## 2017-03-02 MED ORDER — ONDANSETRON HCL 4 MG/2ML IJ SOLN: 4.0000 mg | Freq: Four times a day (QID) | INTRAMUSCULAR | Status: DC | PRN

## 2017-03-02 MED ORDER — FENTANYL CITRATE (PF) 100 MCG/2ML IJ SOLN
INTRAMUSCULAR | Status: DC | PRN
  Administered 2017-03-02: 50 ug via INTRAVENOUS

## 2017-03-02 MED ORDER — HYDRALAZINE HCL 20 MG/ML IJ SOLN
INTRAMUSCULAR | Status: AC
  Filled 2017-03-02: qty 1

## 2017-03-02 MED ORDER — MIDAZOLAM HCL 2 MG/2ML IJ SOLN
INTRAMUSCULAR | Status: DC | PRN
Start: 2017-03-02 — End: 2017-03-02
  Administered 2017-03-02: 1 mg via INTRAVENOUS

## 2017-03-02 MED ORDER — SODIUM CHLORIDE 0.9 % IV SOLN: 1.0000 mL/kg/h | INTRAVENOUS | Status: DC

## 2017-03-02 MED ORDER — MIDAZOLAM HCL 2 MG/2ML IJ SOLN
INTRAMUSCULAR | Status: AC
  Filled 2017-03-02: qty 2

## 2017-03-02 MED ORDER — FENTANYL CITRATE (PF) 100 MCG/2ML IJ SOLN
INTRAMUSCULAR | Status: AC
  Filled 2017-03-02: qty 2

## 2017-03-02 MED ORDER — NITROGLYCERIN 0.4 MG SL SUBL: 0.4000 mg | SUBLINGUAL_TABLET | SUBLINGUAL | Status: DC | PRN

## 2017-03-02 SURGICAL SUPPLY — 11 items
CATH OMNI FLUSH 5F 65CM (CATHETERS) ×2 IMPLANT
CATH STRAIGHT 5FR 65CM (CATHETERS) ×2 IMPLANT
COVER PRB 48X5XTLSCP FOLD TPE (BAG) ×1 IMPLANT
COVER PROBE 5X48 (BAG) ×1
KIT PV (KITS) ×2 IMPLANT
SHEATH PINNACLE 5F 10CM (SHEATH) ×2 IMPLANT
SYR MEDRAD MARK V 150ML (SYRINGE) ×2 IMPLANT
TRANSDUCER W/STOPCOCK (MISCELLANEOUS) ×2 IMPLANT
TRAY PV CATH (CUSTOM PROCEDURE TRAY) ×2 IMPLANT
WIRE BENTSON .035X145CM (WIRE) ×2 IMPLANT
WIRE MINI STICK MAX (SHEATH) ×2 IMPLANT

## 2017-03-02 NOTE — Progress Notes (Signed)
Notified MD of BP elevated at 236/108. Orders received.

## 2017-03-02 NOTE — Progress Notes (Signed)
ANTICOAGULATION CONSULT NOTE - Follow Up Consult  Pharmacy Consult for Heparin  Indication: Ischemic limb  Patient Measurements: Height: 6\' 2"  (188 cm) Weight: 199 lb (90.3 kg) IBW/kg (Calculated) : 82.2  Heparin dosing weight - 90.3 kg Vital Signs: Temp: 98.7 F (37.1 C) (03/19 0525) Temp Source: Oral (03/19 0525) BP: 170/78 (03/19 0525) Pulse Rate: 70 (03/19 0525)  Assessment: 76 yo M on heparin drip with limb ischemia and hx CVA. LE Dopplers negative. R-ABI severe disease, L-ABI wnl. Last heparin level is now undetectable. No issues per RN, gtt has not been off. Hgb stable at 11.2, plts wnl. Plan for arteriogram on Monday with possible intervention.   Goal of Therapy:  Heparin level 0.3-0.7 units/ml Monitor platelets by anticoagulation protocol: Yes   Plan:  Increase heparin gtt to 1,700 units/hr Check 8 hr heparin level Monitor daily heparin level, CBC, s/s of bleed F/U vascular plans  Elenor Quinones, PharmD, BCPS Clinical Pharmacist Pager [phone] 03/02/2017 11:08 AM

## 2017-03-02 NOTE — Progress Notes (Addendum)
Internal Medicine Attending:   I saw and examined the patient. I reviewed the resident's note and I agree with the resident's findings and plan as documented in the resident's note. Mr. Keena resting foot pain has improved, he has not been up out of bed. His complaint today is constipation. On exam his abdomen is soft nontender with positive bowel sounds. Extremities are warm although not able to palpate dorsal pedal and posterior tibial pulses.  He is to undergo arteriogram of lower extremity today, following that we will work on his bowel regimen.

## 2017-03-02 NOTE — Interval H&P Note (Signed)
   History and Physical Update  The patient was interviewed and re-examined.  The patient's previous History and Physical has been reviewed and is unchanged from Dr. Stephens Shire consult .  There is no change in the plan of care: aortogram, right leg runoff, and possible intervention.   I discussed with the patient the nature of angiographic procedures, especially the limited patencies of any endovascular intervention.    The patient is aware of that the risks of an angiographic procedure include but are not limited to: bleeding, infection, access site complications, renal failure, embolization, rupture of vessel, dissection, arteriovenous fistula, possible need for emergent surgical intervention, possible need for surgical procedures to treat the patient's pathology, anaphylactic reaction to contrast, and stroke and death.    The patient is aware of the risks and agrees to proceed.  BMET    Component Value Date/Time   NA 139 03/02/2017 0525   NA 140 02/13/2017 1055   K 4.3 03/02/2017 0525   CL 104 03/02/2017 0525   CO2 24 03/02/2017 0525   GLUCOSE 77 03/02/2017 0525   BUN 22 (H) 03/02/2017 0525   BUN 37 (H) 02/13/2017 1055   CREATININE 1.35 (H) 03/02/2017 0525   CREATININE 1.61 (H) 02/06/2015 1042   CALCIUM 8.4 (L) 03/02/2017 0525   GFRNONAA 49 (L) 03/02/2017 0525   GFRNONAA 37 (L) 10/20/2014 0935   GFRAA 56 (L) 03/02/2017 0525   GFRAA 43 (L) 10/20/2014 0935     Adele Barthel, MD, FACS [facility] [place] Office: [phone] Pager: [phone]  03/02/2017, 2:11 PM

## 2017-03-02 NOTE — Progress Notes (Signed)
5 Fr. Femoral sheath removed from left groin. Manual pressure held by Suella Broad, RN for 20 minutes. Hemostasis obtained. Site is level 0 with no swelling or bleeding.

## 2017-03-02 NOTE — Progress Notes (Signed)
   Subjective: Mr. Balthaser was seen and evaluated today at bedtime. Reports frustration over not being able to eat prior to the procedure today. Reports his toe pain has improved however still throbs.   Objective:  Vital signs in last 24 hours: Vitals:   03/01/17 1347 03/01/17 2127 03/01/17 2253 03/02/17 0525  BP: (!) 165/75 (!) 169/100 (!) 168/91 (!) 170/78  Pulse: 79 76  70  Resp: 17 18  18  Temp: 97.7 F (36.5 C) 97.5 F (36.4 C)  98.7 F (37.1 C)  TempSrc: Oral Oral  Oral  SpO2: 100% 100%  99%  Weight:      Height:       General: Chronically-ill appearing In no acute distress. Resting comfortably in bed.  HENT: EOMI. Icterus appreciated. Mucous membranes moist.  Cardiovascular: Regular rate and rhythm. No murmur or rub appreciated. Pulmonary: CTA BL. Unlabored breathing.  Abdomen: Soft, non-tender and non-distended.+bowel sounds.  Extremities: Right foot edematous, 2+ pitting edema over dorsal aspect and lateral ankle. No warmth, erythema or purulence. Pulses not easily palpated.  Psych: Mood normal and affect was mood congruent. Responds to questions appropriately.   Assessment/Plan:  Principal Problem:   Severe peripheral arterial disease (HCC) Active Problems:   CKD (chronic kidney disease), stage III   Current smoker   Atherosclerosis of leg with intermittent claudication, R   Constipation   Benign prostatic hyperplasia with urinary obstruction   Hyperlipemia   Ischemic pain of foot, right   Paronychia of great toe, right  Severe Peripheral Arterial Disease: ABI performed this admission show severe disease in RLE (0.48). VVS is on board who will evaluate his RLE blood flow today with CTA with possible intervention. Pt continues to have right great toe pain and edema of right foot however there does not appear to be acute active infection. Plain films negative for osteo and ESR somewhat elevated. Will view images from todays imaging for possibility of osteo.    -Arteriogram today with possible intervention. Will follow-up results and recs from VVS -Will also view images, specifically the right great toe -Neurovascular checks Q12H -Pain control with Norco/vicodin -Wound care for right great toe -Heparin for limb ischemia -Plavix  Constipation: Pt reports a "15 day" history of constipation today. Yesterday reported "11 day." And day prior noted "7 day." Abdomen soft, not distended. Certainly could be exacerbated by patients current opioid requirement for his pain. He has been receiving Miralax since admission. Will order suppository for after imaging today.  -Glycerin supp -Senokot -Miralax  CKD Stage III: Hydrating patient well prior to contrast load for vascular imaging today. Foot edema stable however pt has had some hypertension overnight. This is likely due to increased volume from hydration. -Cr 1.35 today, baseline 1.6.  -Repeat BMET   BPH: No complaints of retention. Continue proscar and flomax  Dispo: Anticipated discharge pending arteriogram and subsequent VVS recs.    , DO 03/02/2017, 9:58 AM Pager: [phone]  

## 2017-03-02 NOTE — Op Note (Signed)
OPERATIVE NOTE   PROCEDURE: 1.  Left common femoral artery cannulation under ultrasound guidance 2.  Placement of catheter in aorta 3.  Aortogram 4.  Conscious sedation for 22 minutes 5.  Second order arterial selection 6.  Right leg runoff via catheter 7.  Left leg runoff via sheath  PRE-OPERATIVE DIAGNOSIS: right foot rest pain  POST-OPERATIVE DIAGNOSIS: same as above   SURGEON: Adele Barthel, MD  ANESTHESIA: conscious sedation  ESTIMATED BLOOD LOSS: 30 cc  CONTRAST: 97 cc  FINDING(S):  Aorta: widely patent  Superior mesenteric artery: branches evident but main artery obscured Celiac artery: patent   Right Left  RA patent patent  CIA patent patent  EIA patent patent  IIA patent patent  CFA patent Patent, high bifurcation  SFA Patent proximally, occludes in mid-segment, reconstitutes from extensive collaterals Patent but diseased  PFA Patent with high grade stenosis in distal branch patent  Pop patent Patent, diseased below-the-knee segment which tapers  Trif occluded Patent but heavily diseased tibioperoneal trunk >90% distally  AT occluded Inadequately imaged  Pero occluded Inadequately imaged  PT Occluded, distally reconstitutes from extensive tibial collaterals Inadequately imaged   SPECIMEN(S):  none  INDICATIONS:   Derrick Hendrix is a 76 y.o. male who presents with right foot rest pain.  The patient presents for: aortogram, bilateral leg run off, and possible right leg intervention.  I discussed with the patient the nature of angiographic procedures, especially the limited patencies of any endovascular intervention.  The patient is aware of that the risks of an angiographic procedure include but are not limited to: bleeding, infection, access site complications, renal failure, embolization, rupture of vessel, dissection, possible need for emergent surgical intervention, possible need for surgical procedures to treat the patient's pathology, and stroke and  death.  The patient is aware of the risks and agrees to proceed.  DESCRIPTION: After full informed consent was obtained from the patient, the patient was brought back to the angiography suite.  The patient was placed supine upon the angiography table and connected to cardiopulmonary monitoring equipment.  The patient was then given conscious sedation, the amounts of which are documented in the patient's chart.  A circulating radiologic technician maintained continuous monitoring of the patient's cardiopulmonary status.  Additionally, the control room radiologic technician provided backup monitoring throughout the procedure.  The patient was prepped and drape in the standard fashion for an angiographic procedure.  At this point, attention was turned to the left groin.  Under ultrasound guidance, the subcutaneous tissue surrounding the left common femoral artery was anesthesized with 1% lidocaine with epinephrine.  A high femoral bifurcation was visualized on the left side.  The artery was then cannulated with a micropuncture needle.  The microwire was advanced into the iliac arterial system.  The needle was exchanged for a microsheath, which was loaded into the common femoral artery over the wire.  The microwire was exchanged for a Bentson wire which was advanced into the aorta.  The microsheath was then exchanged for a 5-Fr sheath which was loaded into the common femoral artery.  The Omniflush catheter was then loaded over the wire up to the level of L1.  The catheter was connected to the power injector circuit.  After de-airring and de-clotting the circuit, a power injector aortogram was completed.  Using a Bentson wire and Omniflush catheter, the right common iliac artery was selected.  The wire was advanced into the external iliac artery but the catheter kept kicking out.  I exchanged  the catheter for a straight catheter which was advanced into the right external iliac artery.    The wire was removed  from the catheter and then the catheter connected to the power injector circuit.  An automated right leg runoff was completed.  The findings are listed above.  Based on the images, I doubt the patient has a simple endovascular option.  He likely will need to consider a femoral to distal posterior tibial artery bypass.  The superficial femoral artery occlusion can likely be crossed in the event a popliteal to distal posterior tibial artery bypass is necessary due to inadequate vein length.     The catheter was removed from the left femoral sheath.  The sheath was aspirated.  No clots were present and the sheath was reloaded with heparinized saline.  I connected this left sheath to the power injector circuit.  An automated left leg runoff was completed.  The findings are listed above.  I elected not to repeat the tibial injection as his primary complaints are his right foot.  The mis-timing in the left leg runoff was due to the fast egress of contrast through the left calf arteries.   COMPLICATIONS: none  CONDITION: stable   Adele Barthel, MD, [facility] [place] Office: [phone] Pager: [phone]  03/02/2017, 3:14 PM

## 2017-03-02 NOTE — Care Management Note (Addendum)
Case Management Note  Patient Details  Name: Darek Eifler MRN: [id number] Date of Birth: [birth year]  Subjective/Objective:   s/p vascular intervention- ( arteriogram, right foot rest pain and wound),  Already on plavix,NCM will cont to follow for dc needs.      3/21- Tomi Bamberger RN BSN- patient transferred to floor, he is for [place] and echo and plan for possible right femoral to tibial bypass Friday if cleared by Cards.  NCM will cont to follow for dc needs.              Action/Plan:   Expected Discharge Date:                  Expected Discharge Plan:  Home/Self Care  In-House Referral:     Discharge planning Services  CM Consult  Post Acute Care Choice:    Choice offered to:     DME Arranged:    DME Agency:     HH Arranged:    HH Agency:     Status of Service:  In process, will continue to follow  If discussed at Long Length of Stay Meetings, dates discussed:    Additional Comments:  Zenon Mayo, RN 03/02/2017, 6:09 PM

## 2017-03-02 NOTE — H&P (View-Only) (Signed)
[facility] [place]  Patient name: Derrick Hendrix MRN: [id number] DOB: [birth year] Sex: male  REASON FOR CONSULT: severe right leg pain, consult is from internal medicine teaching service.   HPI: Derrick Hendrix is a 76 y.o. male, who presents with three week history of worsening right great toe pain. He states that his right great toe throbs intermittently. He denies any injury to this toe. His pain improves with standing up. It is worse with laying in bed. One week ago, he was seen by Dr. Amalia Hailey, DPM for right great toe nail pain. His right great toe was removed and there is a localized abscess with purulent drainage underlying the nail plate of the right foot. He continues to complain of pain in his right toe. He denies any fever or chills.   The patient describes claudication on the right walking less than one block. This has been going on for years. Resting alleviates his calf pain. His claudication has worsened over the years, as he used to be able to walk 2-3 blocks before having to stop. He denies any rest pain of the left foot. He occasionally will have calf cramping on the left.   The patient lives at home alone. He smokes approximately 8 cigarettes a day for the past 15 years. He denies any illicit drug use.   He has a PMH of CVA without residual deficits back in March 2017. He had a loop recorder implanted during workup that revealed some PACs but no atrial fibrillation.   Past Medical History:  Diagnosis Date  . Chronic kidney disease (CKD), stage III (moderate)   . Daily headache    "lately" (04/17/2016)  . Depression   . Heart murmur   . High cholesterol   . Hyperkalemia 08/2014  . Hypertension   . PAD (peripheral artery disease) ([place])   . Stroke ([place]) 03/2016   hx of PAD; j"ust lots of headaches since" (04/17/2016)    Family History  Problem Relation Age of Onset  . Hypertension Mother     died age 42  . Diabetes Mother     SOCIAL HISTORY: Social  History   Social History  . Marital status: Single    Spouse name: N/A  . Number of children: N/A  . Years of education: N/A   Occupational History  . Not on file.   Social History Main Topics  . Smoking status: Current Every Day Smoker    Packs/day: 0.50    Years: 66.00    Types: Cigarettes  . Smokeless tobacco: Never Used     Comment: cutting back. LESS THAN 1/2 PPD, smokes 8 to 10 a day  . Alcohol use No     Comment: 04/17/2016 "nothing in the last 3-4 years"  . Drug use: No  . Sexual activity: No   Other Topics Concern  . Not on file   Social History Narrative   Works in the yard          Allergies  Allergen Reactions  . Aspirin Other (See Comments)    Nosebleeds     Current Facility-Administered Medications  Medication Dose Route Frequency Provider Last Rate Last Dose  . heparin ADULT infusion 100 units/mL (25000 units/265mL sodium chloride 0.45%)  1,200 Units/hr Intravenous Continuous Truman Hayward, MD      . heparin bolus via infusion 4,000 Units  4,000 Units Intravenous Once Truman Hayward, MD      . HYDROcodone-acetaminophen (NORCO/VICODIN) 5-325 MG per  tablet 1-2 tablet  1-2 tablet Oral Q4H PRN Rushil Sherrye Payor, MD      . ondansetron (ZOFRAN) tablet 4 mg  4 mg Oral Q6H PRN Rushil Sherrye Payor, MD       Or  . ondansetron (ZOFRAN) injection 4 mg  4 mg Intravenous Q6H PRN Rushil Sherrye Payor, MD      . polyethylene glycol (MIRALAX / GLYCOLAX) packet 17 g  17 g Oral Daily Rushil Sherrye Payor, MD   Stopped at 02/27/17 1523    REVIEW OF SYSTEMS:  [X]  denotes positive finding, [ ]  denotes negative finding Cardiac  Comments:  Chest pain or chest pressure:    Shortness of breath upon exertion:    Short of breath when lying flat:    Irregular heart rhythm:        Vascular    Pain in calf, thigh, or hip brought on by ambulation:    Pain in feet at night that wakes you up from your sleep:     Blood clot in your veins:    Leg swelling:  x       Pulmonary    Oxygen  at home:    Productive cough:     Wheezing:         Neurologic    Sudden weakness in arms or legs:     Sudden numbness in arms or legs:     Sudden onset of difficulty speaking or slurred speech:    Temporary loss of vision in one eye:     Problems with dizziness:         Gastrointestinal    Blood in stool:     Vomited blood:         Genitourinary    Burning when urinating:     Blood in urine:        Psychiatric    Major depression:         Hematologic    Bleeding problems:    Problems with blood clotting too easily:        Skin    Rashes or ulcers:        Constitutional    Fever or chills:      PHYSICAL EXAM: Vitals:   02/27/17 1421  BP: (!) 149/74  Pulse: 72  Resp: 17  Temp: 97.5 F (36.4 C)  TempSrc: Oral  SpO2: 100%  Weight: 199 lb (90.3 kg)  Height: 6\' 2"  (1.88 m)    GENERAL: The patient is a well-nourished male, in no acute distress. The vital signs are documented above. CARDIAC: There is a regular rate and rhythm. No carotid bruits.  VASCULAR: 2+ radial pulses bilaterally, 2+ femoral pulses bilaterally. Right foot slightly cooler than left but still warm and perfused.  PULMONARY: There is good air exchange bilaterally without wheezing or rales. ABDOMEN: Soft and non-tender.  MUSCULOSKELETAL: Darkening to tips of right great toe, right 2nd, 4th and 5th toes. Right foot is swollen. Right leg is slightly more swollen than left.  NEUROLOGIC: Motor and sensory function intact to feet bilaterally.  SKIN: Right great toe missing. No purulence or fluctuance seen.  PSYCHIATRIC: The patient has a normal affect.  DATA:  ABIs and venous duplex pending  Review of ABIs and lower extremity arterial duplex from 06/28/14 showed:  R: 0.71 with dampened monophasic DP and PT waveforms L: 1.14 with biphasic DP and triphasic PT  MEDICAL ISSUES: Atherosclerosis native arteries of right lower extremity with rest pain  The patient has  chronic peripheral arterial disease  based on prior ABIs in 2015 and long history of claudication in the right leg. This has now progressed to rest pain in the right foot. He has no history of cardiac arrhythmia to suggest acute embolus. He has intact motor and sensory function of the right foot.  He also has some darkening of the right great toe, 2nd, 4th and 5th toes. Did discuss that this is a potentially limb threatening situation. No indication for emergent intervention. He will need an arteriogram with limited dye versus CO2 given chronic kidney disease and possible intervention early next week. Will need IV hydration prior to procedure. If he has disease amenable to angioplasty or stenting, will perform at time of procedure, otherwise may need bypass. If he is discharged this weekend, can arrange as an outpatient.   Virgina Jock, PA-C [facility] [place]   I agree with the above.  I have seen and evaluated the patient.  He has a right great toe wound.  The ABI on the right is 0.48 and 0.95 on the left.  He has palpable femoral pulses.  His creatinine is slightly elevated at 1.6.  I discussed with the patient that I will recommend angiography on Monday.  This will be done through a left femoral approach.  We may have to use CO2 depending on what his renal function is.  I would recommend IV hydration over the weekend.  He will need to be nothing by mouth after midnight on Sunday.  I discussed with him this is a limb threatening situation.  Annamarie Major

## 2017-03-02 NOTE — Progress Notes (Signed)
Hydralazine administered and BP now 162/90.

## 2017-03-03 ENCOUNTER — Encounter (HOSPITAL_COMMUNITY): Payer: Self-pay | Admitting: Vascular Surgery

## 2017-03-03 ENCOUNTER — Inpatient Hospital Stay (HOSPITAL_COMMUNITY): Payer: Medicare Other

## 2017-03-03 DIAGNOSIS — Z01810 Encounter for preprocedural cardiovascular examination: Secondary | ICD-10-CM

## 2017-03-03 DIAGNOSIS — I739 Peripheral vascular disease, unspecified: Secondary | ICD-10-CM

## 2017-03-03 DIAGNOSIS — K5900 Constipation, unspecified: Secondary | ICD-10-CM

## 2017-03-03 DIAGNOSIS — M869 Osteomyelitis, unspecified: Secondary | ICD-10-CM

## 2017-03-03 LAB — BASIC METABOLIC PANEL
Anion gap: 12 (ref 5–15)
BUN: 23 mg/dL — AB (ref 6–20)
CALCIUM: 8.7 mg/dL — AB (ref 8.9–10.3)
CHLORIDE: 104 mmol/L (ref 101–111)
CO2: 22 mmol/L (ref 22–32)
CREATININE: 1.36 mg/dL — AB (ref 0.61–1.24)
GFR calc non Af Amer: 48 mL/min — ABNORMAL LOW (ref >60)
GFR, EST AFRICAN AMERICAN: 56 mL/min — AB (ref >60)
Glucose, Bld: 80 mg/dL (ref 65–99)
Potassium: 5 mmol/L (ref 3.5–5.1)
Sodium: 138 mmol/L (ref 135–145)

## 2017-03-03 LAB — CBC
HEMATOCRIT: 34.9 % — AB (ref 39.0–52.0)
HEMOGLOBIN: 11.3 g/dL — AB (ref 13.0–17.0)
MCH: 29.8 pg (ref 26.0–34.0)
MCHC: 32.4 g/dL (ref 30.0–36.0)
MCV: 92.1 fL (ref 78.0–100.0)
Platelets: 290 10*3/uL (ref 150–400)
RBC: 3.79 MIL/uL — ABNORMAL LOW (ref 4.22–5.81)
RDW: 14.6 % (ref 11.5–15.5)
WBC: 7.7 10*3/uL (ref 4.0–10.5)

## 2017-03-03 MED ORDER — REGADENOSON 0.4 MG/5ML IV SOLN
0.4000 mg | Freq: Once | INTRAVENOUS | Status: AC
  Administered 2017-03-04: 0.4 mg via INTRAVENOUS
  Filled 2017-03-03: qty 5

## 2017-03-03 NOTE — Plan of Care (Signed)
Problem: Health Behavior/Discharge Planning: Goal: Ability to safely manage health-related needs after discharge will improve Outcome: Progressing Patient will not reinforcement.

## 2017-03-03 NOTE — Progress Notes (Addendum)
   Subjective: Mr. Branscom was seen and evaluated today. He was resting comfortably in recliner. Continues to complain of right great toe pain. Was seen by vascular who informed him of his low blood flow to the foot however was uncertain of the plan going forward.   Objective:  Vital signs in last 24 hours: Vitals:   03/02/17 1944 03/03/17 0650 03/03/17 0700 03/03/17 1109  BP: 135/80 (!) 165/76 (!) 142/58 (!) 151/92  Pulse: 81 80 87 82  Resp: 16 18 18 16   Temp: 97.5 F (36.4 C) 98 F (36.7 C) 98.4 F (36.9 C) 97.8 F (36.6 C)  TempSrc: Oral Oral Oral Oral  SpO2: 96% 96% 96% 98%  Weight:  190 lb 4.1 oz (86.3 kg)    Height:       General: Chronically-ill appearing In no acute distress. Resting comfortably in bedside chair.  HENT: EOMI. Icterus appreciated. Mucous membranes moist.  Cardiovascular: Regular rate and rhythm. No murmur or rub appreciated. Pulmonary: CTA BL. Unlabored breathing.  Abdomen: Soft, non-tender and non-distended.+bowel sounds.  Extremities: Right foot edematous, 1+ pitting edema over dorsal aspect and lateral ankle. No warmth, erythema or purulence. Pulses not easily palpated.  Psych: Mood normal and affect was mood congruent. Responds to questions appropriately.   Assessment/Plan:  Principal Problem:   Severe peripheral arterial disease (HCC) Active Problems:   CKD (chronic kidney disease), stage III   Current smoker   Atherosclerosis of leg with intermittent claudication, R   Constipation   Benign prostatic hyperplasia with urinary obstruction   Hyperlipemia   Ischemic pain of foot, right   Paronychia of great toe, right  Severe Peripheral Arterial Disease: ABI performed this admission show severe disease in RLE (0.48). VVS is on board who performed arteriogram which showed significant perfusion issues of his RLE. Cardiology was consulted by VVS for pre-op evaluation and it appears they are planing for right fem-pop bypass however no note yet from [facility].  Cardiology wishes to perform lexiscan nuclear stress test to exclude ischemia and can proceed with intervention if this is without high-risk findings. They will also order ECHO.  -Cardiology and [facility]VS on board - having ischemic work-up prior to reported anticipated right fem-pop bypass. Unclear date of surgery. -MRI without contrast to evaluate right foot for osteomyelitis and need for abx/intervention -Neurovascular checks Q12H -Pain control with Norco/vicodin -Wound care for right great toe -Heparin for limb ischemia -Plavix  Constipation: Has had multiple bowel movements since starting bowel regimen yesterday -Glycerin supp -Senokot -Miralax  CKD Stage III: Hydrated well prior to contrast load prior to arteriogram.  -Cr 1.36 today, baseline 1.6.  -Repeat BMET   BPH: No complaints of retention. Continue proscar and flomax  Dispo: Anticipated discharge pending cards work-up and subsequent VVS recs.   Bethany Molt, DO 03/03/2017, 1:14 PM Pager: [phone]    Date: 03/03/2017  Patient name: Derrick Hendrix  Medical record number: [id number]  Date of birth: July 18, 1941   This patient's plan of care was discussed with the house staff. Please see their note for complete details. I concur with their findings.  Greatly appreciate VVS and Cardiology's critical help here. We will also get MRI of his foot to look for deep infection. His edema has diminished vs last week though still with pain in toe and absent pulses. He will need bypass surgery for his circulation.    Truman Hayward, MD 03/03/2017, 3:15 PM

## 2017-03-03 NOTE — Progress Notes (Signed)
Lower Extremity Vein Map    Right Great Saphenous Vein   Segment Diameter Comment  1. Origin 4.11mm   2. High Thigh 4.20mm   3. Mid Thigh 3.38mm   4. Low Thigh 4.89mm   5. At Knee 3.23mm   6. High Calf 3.32mm Branch  7. Low Calf 2.26mm Branch  8. Ankle 1.51mm                 Right Small Saphenous Vein Unable to visualize    Left Great Saphenous Vein   Segment Diameter Comment  1. Origin 5.23mm   2. High Thigh 4.65mm Branch  3. Mid Thigh 4.26mm   4. Low Thigh 3.34mm Branch  5. At Knee 3.41mm   6. High Calf 3.80mm Branch  7. Low Calf 2.43mm   8. Ankle 3.71mm                 Left Small Saphenous Vein  Segment Diameter Comment  1. Origin 1.86mm   2. High Calf  Unable to visualize.  3. Low Calf  Unable to visualize.  4. Ankle  Unable to visualize.                03/03/2017 2:42 PM Maudry Mayhew, BS, RVT, RDCS, RDMS

## 2017-03-03 NOTE — Progress Notes (Signed)
  [facility] Progress Note  Subjective  - POD #1  No complaints.  Objective Vitals:   03/03/17 0700 03/03/17 1109  BP: (!) 142/58 (!) 151/92  Pulse: 87 82  Resp: 18 16  Temp: 98.4 F (36.9 C) 97.8 F (36.6 C)    Intake/Output Summary (Last 24 hours) at 03/03/17 1410 Last data filed at 03/03/17 1331  Gross per 24 hour  Intake          1226.39 ml  Output             2005 ml  Net          -778.61 ml   Left groin without hematoma. Right foot no acute changes.   Assessment/Planning: 76 y.o. male is s/p: arteriogram, right foot rest pain and wound 1 Day Post-Op   Vein mapping lower extremities for possible right femoral-tibial bypass.  Cardiology consulted for pre-op evaluation. They are planning for stress test and ECHO.   Alvia Grove 03/03/2017 2:10 PM --  Laboratory CBC    Component Value Date/Time   WBC 7.7 03/03/2017 0204   HGB 11.3 (L) 03/03/2017 0204   HCT 34.9 (L) 03/03/2017 0204   PLT 290 03/03/2017 0204    BMET    Component Value Date/Time   NA 138 03/03/2017 0204   NA 140 02/13/2017 1055   K 5.0 03/03/2017 0204   CL 104 03/03/2017 0204   CO2 22 03/03/2017 0204   GLUCOSE 80 03/03/2017 0204   BUN 23 (H) 03/03/2017 0204   BUN 37 (H) 02/13/2017 1055   CREATININE 1.36 (H) 03/03/2017 0204   CREATININE 1.61 (H) 02/06/2015 1042   CALCIUM 8.7 (L) 03/03/2017 0204   GFRNONAA 48 (L) 03/03/2017 0204   GFRNONAA 37 (L) 10/20/2014 0935   GFRAA 56 (L) 03/03/2017 0204   GFRAA 43 (L) 10/20/2014 0935    COAG No results found for: INR, PROTIME No results found for: PTT  Antibiotics Anti-infectives    None       Virgina Jock, PA-C [facility] Office: [phone] Pager: [phone] 03/03/2017 2:10 PM

## 2017-03-03 NOTE — Progress Notes (Signed)
Patient states his toenail was removed at doctors office prior to hospital arrival. Pt states "maybe got infected from dropping wood on it." Right great toe is dressed with 4x4 gauze and tape. The dressing is dry and intact. Dressing removed. The right great toe is absent of drainage, is not malodorous,skin is dry and flaky.  Cleaned wound with sterile saline solution, patient reports some discomfort during cleaning. Between right great toe and right 2nd toe patient is noted to have a small (< 0.5 cm) area of intact pink skin. Cleaned 2nd site as well. Wrapped great toe only with non-stick telfa. Patient tolerated well. Note doppler pulse not present for right dorsalis pedis- all other pedal pulses are present with doppler.

## 2017-03-03 NOTE — Consult Note (Signed)
CARDIOLOGY CONSULT NOTE   Patient ID: Derrick Hendrix MRN: [id number] DOB/AGE: 1941/10/21 76 y.o.  Admit date: 02/27/2017  Requesting Physician: Dr. Tommy Medal, Internal Medicine [facility] Primary Physician:   Hinton Lovely, MD Primary Cardiologist:   Debara Pickett Reason for Consultation:   PreOp  HPI: Derrick Hendrix is a 76 y.o. male with a history of Cryptogenic stroke in 2017 with a loop recorder, normal EF 65-70% (3 / 2017), orthostatic hypotension, acute on chronic CTD 3, alcohol use, 43 pack years of smoking and intermittent headaches, HTN, HLD, severe PAD is being evaluated by [facility] for  Left foot pain and swelling and per internal medicine requires a vascular bypass surgery. We have been asked to consult regarding the cardiac risk of surgery.  He has hx of CVA (unknown cause). Negative for known ischemic heart disease, hx of MI, insulin dependent diabetes, or serum Cr > 2. Patient denies having CP or SOB at rest or with exertion. No orthopnea. His mobility is limited due to medical problems with his right foot.  Loop recorder is a Medtronic Reveal [place] [employer] model [id number] which is compatible with MRI.     Past Medical History:  Diagnosis Date  . Chronic kidney disease (CKD), stage III (moderate)   . Daily headache    "lately" (04/17/2016)  . Depression   . Heart murmur   . High cholesterol   . Hyperkalemia 08/2014  . Hypertension   . PAD (peripheral artery disease) ([place])   . Stroke ([place]) 03/2016   hx of PAD; j"ust lots of headaches since" (04/17/2016)     Past Surgical History:  Procedure Laterality Date  . ABDOMINAL AORTOGRAM W/LOWER EXTREMITY N/A 03/02/2017   Procedure: Abdominal Aortogram w/Lower Extremity;  Surgeon: Conrad Firth, MD;  Location: [facility];  Service: Cardiovascular;  Laterality: N/A;  . EP IMPLANTABLE DEVICE N/A 03/04/2016   Procedure: Loop Recorder Insertion;  Surgeon: Thompson Grayer, MD;  Location: [facility];  Service: Cardiovascular;   Laterality: N/A;    Allergies  Allergen Reactions  . Aspirin Other (See Comments)    Nosebleeds     I have reviewed the patient's current medications . atorvastatin  80 mg Oral Daily  . clopidogrel  75 mg Oral Daily  . enoxaparin (LOVENOX) injection  40 mg Subcutaneous Q24H  . finasteride  5 mg Oral Daily  . fludrocortisone  0.2 mg Oral Daily  . polyethylene glycol  17 g Oral Daily  . senna  2 tablet Oral Daily  . tamsulosin  0.4 mg Oral QPC supper  . [START ON 03/06/2017] Vitamin D (Ergocalciferol)  50,000 Units Oral Weekly    acetaminophen, hydrALAZINE, HYDROcodone-acetaminophen, nitroGLYCERIN, ondansetron **OR** ondansetron (ZOFRAN) IV, polyethylene glycol  Prior to Admission medications   Medication Sig Start Date End Date Taking? Authorizing Provider  atorvastatin (LIPITOR) 80 MG tablet Take 1 tablet (80 mg total) by mouth daily. 11/10/16  Yes [name], MD  clopidogrel (PLAVIX) 75 MG tablet Take 1 tablet (75 mg total) by mouth daily. 01/26/17  Yes [name], MD  doxycycline (VIBRA-TABS) 100 MG tablet Take 1 tablet (100 mg total) by mouth 2 (two) times daily. 02/18/17  Yes Edrick Kins, DPM  ergocalciferol (VITAMIN D2) 50000 units capsule Take 50,000 Units by mouth once a week.   Yes Historical Provider, MD  finasteride (PROSCAR) 5 MG tablet Take 1 tablet (5 mg total) by mouth daily. 11/10/16  Yes [name], MD  fludrocortisone (FLORINEF) 0.1 MG tablet Take 2 tablets (0.2 mg total) by mouth daily in the morning. 10/16/16  Yes [name], MD  furosemide (LASIX) 40 MG tablet TAKE 1 TABLET (40 MG TOTAL) BY MOUTH DAILY. 02/18/17 02/18/18 Yes [name], MD  tamsulosin (FLOMAX) 0.4 MG CAPS capsule Take 1 capsule (0.4 mg total) by mouth daily after supper. 10/16/16  Yes [name], MD  acetaminophen (TYLENOL) 500 MG tablet Take 2 tablets (1,000 mg total) by mouth every 6 (six) hours as needed for mild pain (or Fever >/= 101). 04/05/16   Loleta Chance, MD  ipratropium (ATROVENT) 0.06 % nasal spray Place 2 sprays into the nose 3 (three) times daily. Patient taking differently: Place 2 sprays into the nose daily as needed for rhinitis.  02/06/15   Luan Moore, MD  nitroGLYCERIN (NITROSTAT) 0.4 MG SL tablet Place 1 tablet (0.4 mg total) under the tongue every 5 (five) minutes x 3 doses as needed for chest pain. 11/29/14   Luan Moore, MD  polyethylene glycol [facility]) packet Take 17 g by mouth daily as needed (constipation). Patient taking differently: Take 17 g by mouth daily as needed for moderate constipation.  04/05/16   Loleta Chance, MD     Social History   Social History  . Marital status: Single    Spouse name: N/A  . Number of children: N/A  . Years of education: N/A   Occupational History  . Not on file.   Social History Main Topics  . Smoking status: Current Every Day Smoker    Packs/day: 0.50    Years: 66.00    Types: Cigarettes  . Smokeless tobacco: Never Used     Comment: cutting back. LESS THAN 1/2 PPD, smokes 8 to 10 a day  . Alcohol use No     Comment: 04/17/2016 "nothing in the last 3-4 years"  . Drug use: No  . Sexual activity: No   Other Topics Concern  . Not on file   Social History Narrative   Works in the yard          Family Status  Relation Status  . Mother Deceased  . Father Deceased  . Sister Alive   Family History  Problem Relation Age of Onset  . Hypertension Mother     died age 31  . Diabetes Mother          ROS:  Full 14 point review of systems complete and found to be negative unless listed above.  Physical Exam: Blood pressure (!) 142/58, pulse 87, temperature 98.4 F (36.9 C), temperature source Oral, resp. rate 18, height 6\' 2"  (1.88 m), weight 190 lb 4.1 oz (86.3 kg), SpO2 96 %.  General: Well developed, malnourished, dirty and dishevled, male in no acute distress Head: No xanthomas. Normocephalic and atraumatic, Lungs: normal effort and rate of breathing. Heart::  normal rate and rhythm  Neck: No carotid bruits. No lymphadenopathy. No JVD. Abdomen: Bowel sounds present, abdomen soft and non-tender Msk:  Spontaneously moves all 4 extremities Extremities: No clubbing or cyanosis. BL LE peripheral edema R > L, right great toenail is absent Neuro: Alert and oriented X 3. No focal deficits noted. Psych:  Good affect, responds appropriately Skin: No rashes or lesions noted.     Labs:  Lab Results  Component Value Date   WBC 7.7 03/03/2017   HGB 11.3 (L) 03/03/2017   HCT 34.9 (L) 03/03/2017   MCV 92.1 03/03/2017   PLT 290 03/03/2017  No results for input(s): INR in the last 72 hours.  Recent Labs Lab 03/03/17 0204  NA 138  K 5.0  CL 104  CO2 22  BUN 23*  CREATININE 1.36*  CALCIUM 8.7*  GLUCOSE 80    Echo   Transthoracic Echocardiography 3/32/2017 Study Conclusions  - Left ventricle: The cavity size was normal. There was mild   concentric hypertrophy. Systolic function was vigorous. The   estimated ejection fraction was in the range of 65% to 70%. Wall   motion was normal; there were no regional wall motion   abnormalities. Doppler parameters are consistent with abnormal   left ventricular relaxation (grade 1 diastolic dysfunction).   There was no evidence of elevated ventricular filling pressure by   Doppler parameters. - Aortic valve: There was no regurgitation. - Mitral valve: Structurally normal valve. - Left atrium: The atrium was normal in size. - Right ventricle: Systolic function was normal. - Right atrium: The atrium was normal in size. - Tricuspid valve: There was mild regurgitation. - Pulmonary arteries: Systolic pressure was at the upper limits of   normal. PA peak pressure: 32 mm Hg (S). - Inferior vena cava: The vessel was normal in size. - Pericardium, extracardiac: There was no pericardial effusion  Nm Myocar Multi W/spect W/wall Motion / Ef 06/28/2014 CLINICAL DATA:  Chest pain  EXAM: MYOCARDIAL IMAGING  WITH SPECT (REST AND PHARMACOLOGIC-STRESS - 2 DAY PROTOCOL)  GATED LEFT VENTRICULAR WALL MOTION STUDY  LEFT VENTRICULAR EJECTION FRACTION  TECHNIQUE: Standard myocardial SPECT imaging was performed after resting intravenous injection of 10 mCi Tc-11m sestamibi. Subsequently, on a second day, intravenous infusion of Lexiscan was performed under the supervision of the Cardiology staff. At peak effect of the drug, 30 mCi Tc-50m sestamibi was injected intravenously and standard myocardial SPECT imaging was performed. Quantitative gated imaging was also performed to evaluate left ventricular wall motion, and estimate left ventricular ejection fraction.  COMPARISON:  Chest radiograph of 06/27/2014  FINDINGS: Myocardial perfusion study: Reduced inferior wall stress and rest activity compatible with a RCA distribution scar or less likely diaphragmatic attenuation. No inducible ischemia.  Ejection fraction calculation: End-diastolic volume is 72 mL. End systolic volume is 28 mL. Derived left ventricular ejection fraction of 61%.  Wall motion analysis: Left ventricular wall motion and wall thickening appear normal.  IMPRESSION: 1. Reduced activity in the inferior wall on stress and rest images, suspicious for scar. Diaphragmatic attenuation is less likely. No inducible ischemia. Ejection fraction normal.   Electronically Signed   By: Sherryl Barters M.D.   On:  15:13   ECG:     HR 81, Sinus rhythm with PAC's, early repol? EKG not significantly changed from prior tracing on 07/10/2016.- personally reviewed.   Radiology:    ASSESSMENT AND PLAN:      Severe Peripheral Arterial Disease: Patient has arterial occlusions to his superficial femoral artery requiring bypass. Echo in 2017 with normal EF.  Lexiscan Myoview 06/28/2014 reduced activity in the inferior wall on stress and rest images, suspicious for scar. Diaphragmatic attenuation is less likely. No inducible ischemia. Ejection fraction normal.   He is 76 year  old with hx of CVA and hypertension.  --  Pertinent negatives: No hx of ischemic heart disease, MI, noninsulin dependent diabetes, serum Cr is 1.36 -- Repeat Echo and Lexiscan Myoview  History of CVA: No residual deficit per patient, unclear etiology has a Medtronic loop recorder in place. On Plavix, aspirin and Atorvastatin.  HLD: On Atorvastatin 80 mg  History of BPH: IM to manage  History of hyporeninemic hypoaldosteronism: IM to manage   Signed: Linus Mako, PA-C 03/03/2017 10:48 AM   I have personally seen and examined this patient with Delos Haring, PA-C. I agree with the assessment and plan as outlined above. Mr. Faircloth has history of long standing tobacco abuse, known PAD and prior CVA and is admitted with ischemic right foot. He has undergone LE runoff and it appears that Vascular surgery is planning right fem-pop bypass. We are asked to see him to help outline his cardiac risk with the planned surgical procedure.  My exam shows a WDWN male in NAD. CV:RRR. Lungs: clear bilaterally. ABD: soft, NT. Ext: no edema. EKG reviewed and shows chronic ST elevation diffusely. Sinus Labs reviewed by me.  Plan: I suspect that he has CAD given his history. No prior cardiac events. Will plan Lexiscan nuclear stress test to exclude ischemia. If there are no high risk findings, can proceed with planned surgery. Will also arrange echo to exclude valve disease and assess LVEF.  We will follow up after his testing.   Lauree Chandler 03/03/2017 1:10 PM

## 2017-03-03 NOTE — Care Management Important Message (Signed)
Important Message  Patient Details  Name: Derrick Hendrix MRN: [id number] Date of Birth: 17-Jan-1941   Medicare Important Message Given:  Yes    Jenne Sellinger 03/03/2017, 11:45 AM

## 2017-03-04 ENCOUNTER — Ambulatory Visit: Payer: Medicare Other | Admitting: Podiatry

## 2017-03-04 ENCOUNTER — Inpatient Hospital Stay (HOSPITAL_COMMUNITY): Payer: Medicare Other

## 2017-03-04 DIAGNOSIS — Z01810 Encounter for preprocedural cardiovascular examination: Secondary | ICD-10-CM

## 2017-03-04 DIAGNOSIS — B9689 Other specified bacterial agents as the cause of diseases classified elsewhere: Secondary | ICD-10-CM

## 2017-03-04 DIAGNOSIS — M86171 Other acute osteomyelitis, right ankle and foot: Secondary | ICD-10-CM

## 2017-03-04 DIAGNOSIS — I5030 Unspecified diastolic (congestive) heart failure: Secondary | ICD-10-CM

## 2017-03-04 LAB — ECHOCARDIOGRAM COMPLETE
CHL CUP DOP CALC LVOT VTI: 29.4 cm
CHL CUP TV REG PEAK VELOCITY: 300 cm/s
E/e' ratio: 7.82
FS: 33 % (ref 28–44)
Height: 74 in
IVS/LV PW RATIO, ED: 0.82
LA vol A4C: 67.4 ml
LA vol index: 31.3 mL/m2
LADIAMINDEX: 1.55 cm/m2
LASIZE: 33 mm
LAVOL: 66.6 mL
LDCA: 2.84 cm2
LEFT ATRIUM END SYS DIAM: 33 mm
LV E/e' medial: 7.82
LV E/e'average: 7.82
LV PW d: 11 mm — AB (ref 0.6–1.1)
LV TDI E'LATERAL: 9.25
LV TDI E'MEDIAL: 6.09
LVELAT: 9.25 cm/s
LVOT peak grad rest: 9 mmHg
LVOTD: 19 mm
LVOTPV: 154 cm/s
LVOTSV: 83 mL
MV Peak grad: 2 mmHg
MV pk E vel: 72.3 m/s
MVPKAVEL: 99.4 m/s
RV LATERAL S' VELOCITY: 285 cm/s
TAPSE: 23 mm
TRMAXVEL: 300 cm/s
Weight: 3044.11 oz

## 2017-03-04 LAB — NM MYOCAR MULTI W/SPECT W/WALL MOTION / EF
CHL CUP NUCLEAR SDS: 1
CHL CUP RESTING HR STRESS: 69 {beats}/min
CHL CUP STRESS STAGE 1 GRADE: 0 %
CHL CUP STRESS STAGE 1 HR: 75 {beats}/min
CHL CUP STRESS STAGE 2 GRADE: 0 %
CHL CUP STRESS STAGE 3 GRADE: 0 %
CHL CUP STRESS STAGE 3 HR: 90 {beats}/min
CHL CUP STRESS STAGE 3 SPEED: 0 mph
CHL CUP STRESS STAGE 4 DBP: 63 mmHg
CHL CUP STRESS STAGE 4 GRADE: 0 %
CHL CUP STRESS STAGE 4 HR: 86 {beats}/min
CHL CUP STRESS STAGE 4 SBP: 140 mmHg
CHL CUP STRESS STAGE 4 SPEED: 0 mph
Exercise duration (min): 4 min
Exercise duration (sec): 0 s
LVDIAVOL: 88 mL (ref 62–150)
LVSYSVOL: 41 mL
RATE: 0.31
SRS: 5
SSS: 6
Stage 1 Speed: 0 mph
Stage 2 HR: 73 {beats}/min
Stage 2 Speed: 0 mph
Stage 3 DBP: 62 mmHg
Stage 3 SBP: 134 mmHg
TID: 1.02

## 2017-03-04 MED ORDER — REGADENOSON 0.4 MG/5ML IV SOLN
INTRAVENOUS | Status: AC
  Filled 2017-03-04: qty 5

## 2017-03-04 MED ORDER — TECHNETIUM TC 99M TETROFOSMIN IV KIT
30.0000 | PACK | Freq: Once | INTRAVENOUS | Status: AC | PRN
  Administered 2017-03-04: 30 via INTRAVENOUS

## 2017-03-04 MED ORDER — TECHNETIUM TC 99M TETROFOSMIN IV KIT
10.0000 | PACK | Freq: Once | INTRAVENOUS | Status: AC | PRN
  Administered 2017-03-04: 10 via INTRAVENOUS

## 2017-03-04 MED ORDER — REGADENOSON 0.4 MG/5ML IV SOLN
0.4000 mg | Freq: Once | INTRAVENOUS | Status: AC
  Filled 2017-03-04: qty 5

## 2017-03-04 NOTE — Progress Notes (Signed)
  Echocardiogram 2D Echocardiogram has been performed.  Derrick Hendrix 03/04/2017, 1:12 PM

## 2017-03-04 NOTE — Progress Notes (Signed)
Progress Note  Patient Name: Derrick Hendrix Date of Encounter: 03/04/2017  Primary Cardiologist: Dr. Debara Pickett  Subjective   Patient is feeling well today, has eaten breakfast. No complaints, questions or concerns at this time.  Inpatient Medications    Scheduled Meds: . atorvastatin  80 mg Oral Daily  . clopidogrel  75 mg Oral Daily  . enoxaparin (LOVENOX) injection  40 mg Subcutaneous Q24H  . finasteride  5 mg Oral Daily  . fludrocortisone  0.2 mg Oral Daily  . polyethylene glycol  17 g Oral Daily  . regadenoson      . regadenoson  0.4 mg Intravenous Once  . regadenoson  0.4 mg Intravenous Once  . senna  2 tablet Oral Daily  . tamsulosin  0.4 mg Oral QPC supper  . [START ON 03/06/2017] Vitamin D (Ergocalciferol)  50,000 Units Oral Weekly   Continuous Infusions:  PRN Meds: acetaminophen, hydrALAZINE, HYDROcodone-acetaminophen, nitroGLYCERIN, ondansetron **OR** ondansetron (ZOFRAN) IV, polyethylene glycol   Vital Signs    Vitals:   03/03/17 1739 03/03/17 1900 03/04/17 0215 03/04/17 0632  BP: (!) 184/98 (!) 152/60 (!) 155/79 (!) 154/81  Pulse:  86 80 77  Resp:  18 17 18   Temp:  97.4 F (36.3 C)  98.1 F (36.7 C)  TempSrc:  Oral  Oral  SpO2:  97% 96% 96%  Weight:      Height:        Intake/Output Summary (Last 24 hours) at 03/04/17 0847 Last data filed at 03/04/17 0208  Gross per 24 hour  Intake              720 ml  Output              470 ml  Net              250 ml   Filed Weights   02/27/17 1421 03/03/17 0650  Weight: 199 lb (90.3 kg) 190 lb 4.1 oz (86.3 kg)    Telemetry    Sinus rhythm, HR 70 - 90's. Tele monitor reports multiple episodes of afib but I don't agree.  - Personally Reviewed  ECG    HR 81, Sinus rhythm with PAC's, early repol? EKG not significantly changed from prior tracing on 07/10/2016.- personally reviewed.  Physical Exam   General: Well developed, malnourished, dishevled, male in no acute distress Head: No xanthomas.  Normocephalic and atraumatic, Lungs: normal effort and rate of breathing. Heart:: normal rate and rhythm  Neck: No carotid bruits. No lymphadenopathy. No JVD. Abdomen: Bowel sounds present, abdomen soft and non-tender Msk:  Spontaneously moves all 4 extremities Extremities: No clubbing or cyanosis. BL LE peripheral edema R > L, right great toenail is absent Neuro: Alert and oriented X 3. No focal deficits noted. Psych:  Good affect, responds appropriately Skin: No rashes or lesions noted.  Labs    Chemistry Recent Labs Lab 03/01/17 0516 03/02/17 0525 03/02/17 1643 03/03/17 0204  NA 141 139  --  138  K 4.0 4.3  --  5.0  CL 105 104  --  104  CO2 25 24  --  22  GLUCOSE 138* 77  --  80  BUN 26* 22*  --  23*  CREATININE 1.59* 1.35* 1.36* 1.36*  CALCIUM 8.2* 8.4*  --  8.7*  GFRNONAA 40* 49* 48* 48*  GFRAA 46* 56* 56* 56*  ANIONGAP 11 11  --  12     Hematology Recent Labs Lab 03/02/17 0525 03/02/17 1643 03/03/17 9163  WBC 8.3 7.2 7.7  RBC 3.86* 4.13* 3.79*  HGB 11.2* 12.1* 11.3*  HCT 35.7* 38.0* 34.9*  MCV 92.5 92.0 92.1  MCH 29.0 29.3 29.8  MCHC 31.4 31.8 32.4  RDW 14.5 14.5 14.6  PLT 265 250 290    Radiology    No results found.  Cardiac Studies   Transthoracic Echocardiography 3/32/2017 Study Conclusions  - Left ventricle: The cavity size was normal. There was mild concentric hypertrophy. Systolic function was vigorous. The estimated ejection fraction was in the range of 65% to 70%. Wall motion was normal; there were no regional wall motion abnormalities. Doppler parameters are consistent with abnormal left ventricular relaxation (grade 1 diastolic dysfunction). There was no evidence of elevated ventricular filling pressure by Doppler parameters. - Aortic valve: There was no regurgitation. - Mitral valve: Structurally normal valve. - Left atrium: The atrium was normal in size. - Right ventricle: Systolic function was normal. - Right  atrium: The atrium was normal in size. - Tricuspid valve: There was mild regurgitation. - Pulmonary arteries: Systolic pressure was at the upper limits of normal. PA peak pressure: 32 mm Hg (S). - Inferior vena cava: The vessel was normal in size. - Pericardium, extracardiac: There was no pericardial effusion  Nm Myocar Multi W/spect W/wall Motion / Ef 06/28/2014 CLINICAL DATA: Chest pain EXAM: MYOCARDIAL IMAGING WITH SPECT (REST AND PHARMACOLOGIC-STRESS - 2 DAY PROTOCOL) GATED LEFT VENTRICULAR WALL MOTION STUDY LEFT VENTRICULAR EJECTION FRACTION TECHNIQUE: Standard myocardial SPECT imaging was performed after resting intravenous injection of 10 mCi Tc-44m sestamibi. Subsequently, on a second day, intravenous infusion of Lexiscan was performed under the supervision of the Cardiology staff. At peak effect of the drug, 30 mCi Tc-24m sestamibi was injected intravenously and standard myocardial SPECT imaging was performed. Quantitative gated imaging was also performed to evaluate left ventricular wall motion, and estimate left ventricular ejection fraction. COMPARISON: Chest radiograph of 06/27/2014 FINDINGS: Myocardial perfusion study: Reduced inferior wall stress and rest activity compatible with a RCA distribution scar or less likely diaphragmatic attenuation. No inducible ischemia. Ejection fraction calculation: End-diastolic volume is 72 mL. End systolic volume is 28 mL. Derived left ventricular ejection fraction of 61%. Wall motion analysis: Left ventricular wall motion and wall thickening appear normal. IMPRESSION: 1. Reduced activity in the inferior wall on stress and rest images, suspicious for scar. Diaphragmatic attenuation is less likely. No inducible ischemia. Ejection fraction normal. Electronically Signed By: Derrick Hendrix M.D. On:  15:13   Patient Profile      Derrick Hendrix is a 76 y.o. male with a history of Cryptogenic stroke in 2017 with a loop recorder, normal EF  65-70% (3 / 2017), orthostatic hypotension, acute on chronic CTD 3, alcohol use, 43 pack years of smoking and intermittent headaches, HTN, HLD, severe PAD is being evaluated by [facility] for  Left foot pain and swelling and per internal medicine requires a right femoral to tibial bypass surgery.  He has hx of CVA (unknown cause). Negative for known ischemic heart disease, hx of MI, insulin dependent diabetes, or serum Cr > 2. Patient denies having CP or SOB at rest or with exertion. No orthopnea. His mobility is limited due to medical problems with his right foot.  Assessment & Plan    Severe Peripheral Arterial Disease:  Suspect that he has CAD given his history. No prior cardiac events. Will plan Lexiscan nuclear stress test to exclude ischemia. If there are no high risk findings, can proceed with planned surgery. Will also arrange echo to  exclude valve disease and assess LVEF.  We will follow up after his testing which is still pending for today  History of CVA: No residual deficit per patient, unclear etiology has a Medtronic loop recorder in place. On Plavix, aspirin and Atorvastatin.  HLD: On Atorvastatin 80 mg  History of BPH: IM to manage  History of hyporeninemic hypoaldosteronism:IM to manage  Signed, Linus Mako, PA-C  03/04/2017, 8:47 AM    I have personally seen and examined this patient with Delos Haring, PA-C. I agree with the assessment and plan as outlined above. He has severe PAD and needs fem/pop bypass. Stress test and echo pending. Will follow up after these tests and make final recs for risk assessment.   Lauree Chandler 03/04/2017 12:29 PM

## 2017-03-04 NOTE — Progress Notes (Signed)
Pt off floor for testing.  Cardiology consulted and pt will undergo Lexiscan and echo and will provide further recommendations.  Plan for possible right femoral to tibial bypass Friday if cleared by cardiology.   Leontine Locket, [facility] 03/04/2017 8:24 AM

## 2017-03-04 NOTE — Progress Notes (Signed)
Myoview completed, awaiting final read.    Derrick Hendrix

## 2017-03-04 NOTE — Progress Notes (Deleted)
Progress Note  Patient Name: Derrick Hendrix Date of Encounter: 03/04/2017  Primary Cardiologist: Dr. Debara Pickett  Subjective   Patient is feeling well; denies chest pain, SOB, and palpitations.   Inpatient Medications    Scheduled Meds: . atorvastatin  80 mg Oral Daily  . clopidogrel  75 mg Oral Daily  . enoxaparin (LOVENOX) injection  40 mg Subcutaneous Q24H  . finasteride  5 mg Oral Daily  . fludrocortisone  0.2 mg Oral Daily  . polyethylene glycol  17 g Oral Daily  . regadenoson      . regadenoson  0.4 mg Intravenous Once  . regadenoson  0.4 mg Intravenous Once  . senna  2 tablet Oral Daily  . tamsulosin  0.4 mg Oral QPC supper  . [START ON 03/06/2017] Vitamin D (Ergocalciferol)  50,000 Units Oral Weekly   Continuous Infusions:  PRN Meds: acetaminophen, hydrALAZINE, HYDROcodone-acetaminophen, nitroGLYCERIN, ondansetron **OR** ondansetron (ZOFRAN) IV, polyethylene glycol   Vital Signs    Vitals:   03/03/17 1900 03/04/17 0215 03/04/17 0632 03/04/17 0859  BP: (!) 152/60 (!) 155/79 (!) 154/81 130/71  Pulse: 86 80 77   Resp: 18 17 18    Temp: 97.4 F (36.3 C)  98.1 F (36.7 C)   TempSrc: Oral  Oral   SpO2: 97% 96% 96%   Weight:      Height:        Intake/Output Summary (Last 24 hours) at 03/04/17 0175 Last data filed at 03/04/17 0208  Gross per 24 hour  Intake              720 ml  Output              470 ml  Net              250 ml   Filed Weights   02/27/17 1421 03/03/17 0650  Weight: 199 lb (90.3 kg) 190 lb 4.1 oz (86.3 kg)     Physical Exam   General: Well developed, well nourished, male appearing in no acute distress. Head: Normocephalic, atraumatic.  Neck: Supple without bruits, No JVD Lungs:  Resp regular and unlabored, CTA. Heart: RRR, S1, S2, no murmur; no rub. Abdomen: Soft, non-tender, non-distended with normoactive bowel sounds. No hepatomegaly. No rebound/guarding. No obvious abdominal masses. Extremities: No clubbing, cyanosis, no edema.  Distal pedal pulses are 1+ bilaterally. Neuro: Alert and oriented X 3. Moves all extremities spontaneously. Psych: Normal affect.  Labs    Chemistry Recent Labs Lab 03/01/17 0516 03/02/17 0525 03/02/17 1643 03/03/17 0204  NA 141 139  --  138  K 4.0 4.3  --  5.0  CL 105 104  --  104  CO2 25 24  --  22  GLUCOSE 138* 77  --  80  BUN 26* 22*  --  23*  CREATININE 1.59* 1.35* 1.36* 1.36*  CALCIUM 8.2* 8.4*  --  8.7*  GFRNONAA 40* 49* 48* 48*  GFRAA 46* 56* 56* 56*  ANIONGAP 11 11  --  12     Hematology Recent Labs Lab 03/02/17 0525 03/02/17 1643 03/03/17 0204  WBC 8.3 7.2 7.7  RBC 3.86* 4.13* 3.79*  HGB 11.2* 12.1* 11.3*  HCT 35.7* 38.0* 34.9*  MCV 92.5 92.0 92.1  MCH 29.0 29.3 29.8  MCHC 31.4 31.8 32.4  RDW 14.5 14.5 14.6  PLT 265 250 290    Cardiac EnzymesNo results for input(s): TROPONINI in the last 168 hours. No results for input(s): TROPIPOC in the last 168 hours.  BNPNo results for input(s): BNP, PROBNP in the last 168 hours.   DDimer No results for input(s): DDIMER in the last 168 hours.   Radiology    No results found.   Telemetry    NSR with PACs - Personally Reviewed  ECG    NSR with PACs - Personally Reviewed   Cardiac Studies   Echocardiogram 03/04/17: pending  Myoview 03/04/17: in progress, awaiting final read  Patient Profile     76 y.o. male with a history of Cryptogenic stroke in 2017 with a loop recorder, normal EF 65-70% (3 / 2017), orthostatic hypotension, acute on chronic CTD 3, alcohol use, 43 pack years of smoking and intermittent headaches, HTN, HLD, severe PAD is being evaluated by [facility] for  Left foot pain and swelling and per internal medicine requires a vascular bypass surgery. We have been asked to consult regarding the cardiac risk of surgery.  Assessment & Plan    1. Severe peripheral arterial disease - Patient has arterial occlusions to his superficial femoral artery requiring bypass. Echo in 2017 with normal EF.   Lexiscan Myoview 06/28/2014 reduced activity in the inferior wall on stress and rest images, suspicious for scar. Diaphragmatic attenuation is less likely. No inducible ischemia. Ejection fraction normal. He is 76 year old with hx of CVA and hypertension.  --          Pertinent negatives: No hx of ischemic heart disease, MI, noninsulin dependent diabetes, serum Cr is 1.36 --          Repeat Echo and Lexiscan Myoview pending  2. Hx of CVA - no residual deficits per patient, Medtronic loop recorder in place. On ASA, Plavix  3. HLD - on atorvastatin  4. Hx of BPH: per primary team  5. Hx of hyporeninemic hypoaldosteronism - per primary team  Signed, Ledora Bottcher , PA-C 9:23 AM 03/04/2017 Pager: [phone]

## 2017-03-04 NOTE — Progress Notes (Signed)
  Nuclear stress test and cardiac echo are acceptable. Patient cleared for surgical procedure tomorrow from a cardiac standpoint. Discussed with Dr. Angelena Form     Transthoracic Echocardiography 03/04/2017  Study Conclusions  - Left ventricle: The cavity size was normal. Systolic function was   vigorous. The estimated ejection fraction was in the range of 65%   to 70%. Wall motion was normal; there were no regional wall   motion abnormalities. Doppler parameters are consistent with   abnormal left ventricular relaxation (grade 1 diastolic   dysfunction). - Aortic valve: There was no stenosis. - Mitral valve: There was trivial regurgitation. - Right ventricle: The cavity size was normal. Systolic function   was normal. - Tricuspid valve: Peak RV-RA gradient (S): 36 mm Hg. - Pulmonary arteries: PA peak pressure: 39 mm Hg (S). - Inferior vena cava: The vessel was normal in size. The   respirophasic diameter changes were in the normal range (>= 50%),   consistent with normal central venous pressure.  Impressions:  - Normal LV size with EF 65-70%. Normal RV size and systolic   function. No significant valvular abnormalities. Mild pulmonary   hypertension.   NM Myocar Multi W/Spect W/Wall Motion / EF  03/04/2017  Study Result     There was no ST segment deviation noted during stress.  Defect 1: There is a medium defect of moderate severity.  Findings consistent with prior myocardial infarction.  This is an intermediate risk study.  Nuclear stress EF: 54%.   Moderate size and intensity, fixed inferoapical, and apical lateral defect - likely scar. No significant reversible ischemia. LVEF 54% with inferior hypokinesis. This is a low risk study.   Delos Haring, PA-C   I agree with the above. Cardiology will sign off. Please call with questions.  Lauree Chandler 03/04/2017 5:19 PM

## 2017-03-04 NOTE — Progress Notes (Addendum)
Subjective: Derrick Hendrix was seen and evaluated today. He was resting comfortably in recliner. Continues to complain of right great toe pain. Had just returned from his stress test. Informed of osteomyelitis in right great toe, curious when surgery will be and wonders if could have amputation simultaneously.   Objective:  Vital signs in last 24 hours: Vitals:   03/04/17 0925 03/04/17 0927 03/04/17 0929 03/04/17 1344  BP: 136/87 134/62 140/63 (!) 133/54  Pulse:    83  Resp:      Temp:    99 F (37.2 C)  TempSrc:    Oral  SpO2:    94%  Weight:      Height:       General: Chronically-ill appearing In no acute distress. Resting comfortably in bedside chair.  HENT: EOMI. Icterus appreciated. Mucous membranes moist.  Cardiovascular: Regular rate and rhythm. No murmur or rub appreciated. Pulmonary: CTA BL. Unlabored breathing.  Abdomen: Soft, non-tender and non-distended.+bowel sounds.  Extremities: Right foot edematous, 1+ pitting edema over dorsal aspect and lateral ankle. No warmth, erythema or purulence. Pulses not palpated.  Psych: Mood normal and affect was mood congruent. Responds to questions appropriately.   Assessment/Plan:  Principal Problem:   Severe peripheral arterial disease (HCC) Active Problems:   CKD (chronic kidney disease), stage III   Current smoker   Atherosclerosis of leg with intermittent claudication, R   Constipation   Benign prostatic hyperplasia with urinary obstruction   Hyperlipemia   Ischemic pain of foot, right   Paronychia of great toe, right   Osteomyelitis (HCC)  Severe Peripheral Arterial Disease: ABI performed this admission show severe disease in RLE (0.48) which was confirmed via arteriogram. VVS plans for right fem-pop bypass surgery and had consulted cards for pre-op clearance including a stress test and echo. Appears cards has okayed for surg. Seems surg may be tentatively planned for Friday. Will need to touch base with VVS to see if right  great toe amputation could be performed simultaneously as MRI showed osteomyelitis.  -Cardiology and VVS on board - cards ok for surg which is tentatively planned for Friday -Will need to touch base with [facility] to see if amputation possible.  -Neurovascular checks Q12H -Pain control with Norco/vicodin -Wound care for right great toe -Heparin for limb ischemia -Plavix  OSTEOMYELITIS of right great toe: As evidenced on MRI. Will need to be amputated.   WE WOULD WANT INTRA-OPERATIVE CULTURES FROM BONE TO GUIDE "MOP-Up" antibiotics afterwards  Constipation: Resolved. -Glycerin supp -Senokot -Miralax  CKD Stage III: Hydrated well prior to contrast load prior to arteriogram.  -Repeat BMET   BPH: No complaints of retention. Continue proscar and flomax  Dispo: Anticipated discharge pending right fem-pop bypass and right great toe amputation.   Derrick Molt, DO 03/04/2017, 5:40 PM Pager: [phone]   Date: 03/05/2017  Patient name: Derrick Hendrix  Medical record number: [id number]  Date of birth: Feb 11, 1941   This patient's plan of care was discussed with the house staff. Please see their note for complete details. I concur with their findings.  Patient appreciative of his care. He is currently in NAD. His foot is wrapped. He is to have revascularization with stent proximally first it is my understanding prior to possible bypass surgery.   With re to his osteomyelitis of his toe I do not think we are going to be able to salvage the toe with revascularization macroscopically and antibiotics. I think the right thing to do with the toe is to amputate  it and send for culture and margins of specimen to pathology to see if osteo has been completely resected. We can then select targetted antibiotics for "mop-up" therapy post amputation.   Defer timing of this to VVS, but I would again prefer to have him off antibiotics to ensure we get cultures from OR without antibiotics clouding the picture.      Derrick Hayward, MD 03/05/2017, 11:16 AM

## 2017-03-05 NOTE — Progress Notes (Signed)
   Subjective: Derrick Hendrix was seen and evaluated today. Denies any complaints other than right great toe pain. Reports vascular surgery told him he will be having stenting of his more proximal occlusion tomorrow. No fevers or chills. Wishes he had more to eat with his meals.   Objective:  Vital signs in last 24 hours: Vitals:   03/04/17 0929 03/04/17 1344 03/04/17 2116 03/05/17 0409  BP: 140/63 (!) 133/54 (!) 147/72 (!) 142/81  Pulse:  83 80 (!) 58  Resp:   18 18  Temp:  99 F (37.2 C) 98.2 F (36.8 C) 98.6 F (37 C)  TempSrc:  Oral Oral Oral  SpO2:  94% 100% 98%  Weight:      Height:       General: Chronically-ill appearing In no acute distress. Resting comfortably in bedside chair.  HENT: EOMI. Icterus appreciated. Mucous membranes moist.  Cardiovascular: Regular rate and rhythm. No murmur or rub appreciated. Pulmonary: CTA BL. Unlabored breathing.  Abdomen: Soft, non-tender and non-distended.+bowel sounds.  Extremities: Right foot edematous. No warmth, erythema. Right great toe wrapped in clean dry dressings. Psych: Mood normal and affect was mood congruent. Responds to questions appropriately.   Assessment/Plan:  Principal Problem:   Severe peripheral arterial disease (HCC) Active Problems:   CKD (chronic kidney disease), stage III   Current smoker   Atherosclerosis of leg with intermittent claudication, R   Constipation   Benign prostatic hyperplasia with urinary obstruction   Hyperlipemia   Ischemic pain of foot, right   Paronychia of great toe, right   Osteomyelitis (HCC)  Severe Peripheral Arterial Disease: ABI performed this admission show severe disease in RLE (0.48) which was confirmed via arteriogram. VVS plans for stenting of proximal occlusion 3/23 +/-  right fem-pop bypass surgery next week. Will also need to address when patient will under go amputation of his right great toe due to osteomyelitis.  -Cardiology and [facility]VS on board - cards ok for procedure which  is tentatively planned for Friday -Will need to touch base with VSS to see if amputation possible.  -Neurovascular checks Q12H -Pain control with Norco/vicodin -Wound care for right great toe -Heparin for limb ischemia -Plavix  OSTEOMYELITIS of right great toe: As evidenced on MRI. Will need to be amputated.   Constipation: Resolved. Will continue current bowel regimen.  -Glycerin supp -Senokot -Miralax  CKD Stage III: Hydrated well prior to contrast load prior to arteriogram.   BPH: No complaints of retention. Continue proscar and flomax  Dispo: Anticipated discharge pending stenting +/- right fem-pop bypass and right great toe amputation.   Derrick Millan, DO 03/05/2017, 7:30 AM Pager: [phone]

## 2017-03-05 NOTE — Progress Notes (Signed)
  Progress Note    03/05/2017 12:39 PM * No surgery date entered *  Subjective:  No complaints    Vitals:   03/04/17 2116 03/05/17 0409  BP: (!) 147/72 (!) 142/81  Pulse: 80 (!) 58  Resp: 18 18  Temp: 98.2 F (36.8 C) 98.6 F (37 C)    Physical Exam: Lungs:  Non labored Extremities:  Right great toe with wound on toenail bed.  Minimal drainage.  CBC    Component Value Date/Time   WBC 7.7 03/03/2017 0204   RBC 3.79 (L) 03/03/2017 0204   HGB 11.3 (L) 03/03/2017 0204   HCT 34.9 (L) 03/03/2017 0204   PLT 290 03/03/2017 0204   MCV 92.1 03/03/2017 0204   MCH 29.8 03/03/2017 0204   MCHC 32.4 03/03/2017 0204   RDW 14.6 03/03/2017 0204   LYMPHSABS 1.2 03/02/2016 1815   MONOABS 0.6 03/02/2016 1815   EOSABS 0.2 03/02/2016 1815   BASOSABS 0.0 03/02/2016 1815    BMET    Component Value Date/Time   NA 138 03/03/2017 0204   NA 140 02/13/2017 1055   K 5.0 03/03/2017 0204   CL 104 03/03/2017 0204   CO2 22 03/03/2017 0204   GLUCOSE 80 03/03/2017 0204   BUN 23 (H) 03/03/2017 0204   BUN 37 (H) 02/13/2017 1055   CREATININE 1.36 (H) 03/03/2017 0204   CREATININE 1.61 (H) 02/06/2015 1042   CALCIUM 8.7 (L) 03/03/2017 0204   GFRNONAA 48 (L) 03/03/2017 0204   GFRNONAA 37 (L) 10/20/2014 0935   GFRAA 56 (L) 03/03/2017 0204   GFRAA 43 (L) 10/20/2014 0935    INR No results found for: INR   Intake/Output Summary (Last 24 hours) at 03/05/17 1239 Last data filed at 03/05/17 1000  Gross per 24 hour  Intake              480 ml  Output              925 ml  Net             -445 ml     Assessment:  76 y.o. male is s/p:  Aortogram with lower extremity runoff  2 days post op  Plan: -pt doing well this morning -dressing removed from great toe and replaced with wet to dry dressing-order placed for bid dressing changes.  Please use very little tape as this is painful to patient -Dr. Trula Slade discussed with pt about going to [facility] lab tomorrow for aortogram and possible intervention  right leg. -npo after MN and consent   Leontine Locket, PA-C [facility] [phone] 03/05/2017 12:39 PM   I agree wiht the above.  I discussed with the patient that we will attempt to cross his right SFA occlusion tomorrow to improve bloodflow to his foot.  He understands this is a limb threatening situation.  We discussed that if the lesion can not be crossed, he will need a femoral to PT bypass.  He has cardiac clearance.  Vein mapping shows an adequate saphenous vein.  Angiography is scheduled for tomorrow with Dr. Marilynne Drivers

## 2017-03-06 ENCOUNTER — Encounter: Payer: Self-pay | Admitting: *Deleted

## 2017-03-06 ENCOUNTER — Encounter: Payer: Self-pay | Admitting: Internal Medicine

## 2017-03-06 ENCOUNTER — Encounter (HOSPITAL_COMMUNITY): Payer: Self-pay | Admitting: Vascular Surgery

## 2017-03-06 ENCOUNTER — Inpatient Hospital Stay (HOSPITAL_COMMUNITY)
Admission: AD | Disposition: A | Discharge status: Home / Self Care | Payer: Self-pay | Source: Ambulatory Visit | Attending: Infectious Disease

## 2017-03-06 DIAGNOSIS — Z01810 Encounter for preprocedural cardiovascular examination: Secondary | ICD-10-CM

## 2017-03-06 DIAGNOSIS — Z955 Presence of coronary angioplasty implant and graft: Secondary | ICD-10-CM

## 2017-03-06 DIAGNOSIS — Z006 Encounter for examination for normal comparison and control in clinical research program: Secondary | ICD-10-CM

## 2017-03-06 HISTORY — PX: PERIPHERAL VASCULAR INTERVENTION: CATH118257

## 2017-03-06 LAB — PROTIME-INR
INR: 1.01
PROTHROMBIN TIME: 13.3 s (ref 11.4–15.2)

## 2017-03-06 LAB — BASIC METABOLIC PANEL
Anion gap: 9 (ref 5–15)
BUN: 28 mg/dL — ABNORMAL HIGH (ref 6–20)
CALCIUM: 8.4 mg/dL — AB (ref 8.9–10.3)
CO2: 24 mmol/L (ref 22–32)
CREATININE: 1.53 mg/dL — AB (ref 0.61–1.24)
Chloride: 105 mmol/L (ref 101–111)
GFR, EST AFRICAN AMERICAN: 48 mL/min — AB (ref >60)
GFR, EST NON AFRICAN AMERICAN: 42 mL/min — AB (ref >60)
Glucose, Bld: 113 mg/dL — ABNORMAL HIGH (ref 65–99)
Potassium: 4.7 mmol/L (ref 3.5–5.1)
Sodium: 138 mmol/L (ref 135–145)

## 2017-03-06 LAB — CBC
HCT: 33.9 % — ABNORMAL LOW (ref 39.0–52.0)
Hemoglobin: 10.7 g/dL — ABNORMAL LOW (ref 13.0–17.0)
MCH: 29.5 pg (ref 26.0–34.0)
MCHC: 31.6 g/dL (ref 30.0–36.0)
MCV: 93.4 fL (ref 78.0–100.0)
PLATELETS: 265 10*3/uL (ref 150–400)
RBC: 3.63 MIL/uL — AB (ref 4.22–5.81)
RDW: 14.7 % (ref 11.5–15.5)
WBC: 6.5 10*3/uL (ref 4.0–10.5)

## 2017-03-06 LAB — HIV ANTIBODY (ROUTINE TESTING W REFLEX): HIV SCREEN 4TH GENERATION: NONREACTIVE

## 2017-03-06 LAB — POCT ACTIVATED CLOTTING TIME
ACTIVATED CLOTTING TIME: 252 s
Activated Clotting Time: 219 seconds
Activated Clotting Time: 175 seconds
Activated Clotting Time: 186 seconds

## 2017-03-06 SURGERY — PERIPHERAL VASCULAR INTERVENTION
Anesthesia: LOCAL | Laterality: Right

## 2017-03-06 MED ORDER — LABETALOL HCL 5 MG/ML IV SOLN
INTRAVENOUS | Status: AC
  Filled 2017-03-06: qty 4

## 2017-03-06 MED ORDER — LIDOCAINE HCL (PF) 1 % IJ SOLN
INTRAMUSCULAR | Status: AC
  Filled 2017-03-06: qty 30

## 2017-03-06 MED ORDER — FENTANYL CITRATE (PF) 100 MCG/2ML IJ SOLN
INTRAMUSCULAR | Status: DC | PRN
  Administered 2017-03-06 (×2): 50 ug via INTRAVENOUS

## 2017-03-06 MED ORDER — IODIXANOL 320 MG/ML IV SOLN
INTRAVENOUS | Status: DC | PRN
  Administered 2017-03-06: 60 mL via INTRA_ARTERIAL

## 2017-03-06 MED ORDER — HEPARIN (PORCINE) IN NACL 2-0.9 UNIT/ML-% IJ SOLN
INTRAMUSCULAR | Status: DC | PRN
  Administered 2017-03-06: 1000 mL

## 2017-03-06 MED ORDER — SODIUM CHLORIDE 0.9 % IV SOLN
INTRAVENOUS | Status: DC | PRN
  Administered 2017-03-06: 100 mL/h via INTRAVENOUS

## 2017-03-06 MED ORDER — HEPARIN SODIUM (PORCINE) 1000 UNIT/ML IJ SOLN
INTRAMUSCULAR | Status: DC | PRN
  Administered 2017-03-06: 8000 [IU] via INTRAVENOUS

## 2017-03-06 MED ORDER — FENTANYL CITRATE (PF) 100 MCG/2ML IJ SOLN
INTRAMUSCULAR | Status: AC
  Filled 2017-03-06: qty 2

## 2017-03-06 MED ORDER — HEPARIN SODIUM (PORCINE) 1000 UNIT/ML IJ SOLN
INTRAMUSCULAR | Status: AC
  Filled 2017-03-06: qty 1

## 2017-03-06 MED ORDER — SODIUM CHLORIDE 0.45 % IV SOLN
INTRAVENOUS | Status: DC
  Administered 2017-03-06: 100 mL/h via INTRAVENOUS

## 2017-03-06 MED ORDER — LABETALOL HCL 5 MG/ML IV SOLN
10.0000 mg | INTRAVENOUS | Status: DC | PRN
  Administered 2017-03-06 (×3): 10 mg via INTRAVENOUS

## 2017-03-06 MED ORDER — METOPROLOL TARTRATE 5 MG/5ML IV SOLN: 2.0000 mg | INTRAVENOUS | Status: DC | PRN

## 2017-03-06 MED ORDER — SODIUM CHLORIDE 0.9 % IV SOLN: INTRAVENOUS | Status: DC

## 2017-03-06 SURGICAL SUPPLY — 20 items
BALLN ARMADA 5X80X135 (BALLOONS) ×3
BALLN LUTONIX DCB 6X100X130 (BALLOONS) ×3
BALLN MUSTANG 6X120X135 (BALLOONS) ×3
BALLOON ARMADA 5X80X135 (BALLOONS) ×2 IMPLANT
BALLOON LUTONIX DCB 6X100X130 (BALLOONS) ×2 IMPLANT
BALLOON MUSTANG 6X120X135 (BALLOONS) ×2 IMPLANT
CATH CROSS OVER TEMPO 5F (CATHETERS) ×3 IMPLANT
CATH TEMPO AQUA 5F 100CM (CATHETERS) ×3 IMPLANT
COVER DOME SNAP 22 D (MISCELLANEOUS) ×3 IMPLANT
GUIDEWIRE ANGLED .035X260CM (WIRE) ×3 IMPLANT
KIT ENCORE 26 ADVANTAGE (KITS) ×3 IMPLANT
KIT PV (KITS) ×3 IMPLANT
SHEATH PINNACLE MP 7F 45CM (SHEATH) ×3 IMPLANT
STENT INNOVA 6X120X130 (Permanent Stent) ×3 IMPLANT
SYR MEDRAD MARK V 150ML (SYRINGE) ×3 IMPLANT
TRANSDUCER W/STOPCOCK (MISCELLANEOUS) ×3 IMPLANT
TRAY PV CATH (CUSTOM PROCEDURE TRAY) ×3 IMPLANT
WIRE AMPLATZ SS-J .035X180CM (WIRE) ×3 IMPLANT
WIRE HI TORQ VERSACORE J 260CM (WIRE) ×3 IMPLANT
WIRE HITORQ VERSACORE ST 145CM (WIRE) ×3 IMPLANT

## 2017-03-06 NOTE — Progress Notes (Signed)
   Subjective: [name] was seen and evaluated today following his stent placement this morning. Complains of pain in his right great toe as well as pain in his left groin. Otherwise okay. Still having BMs.   Objective:  Vital signs in last 24 hours: Vitals:   03/06/17 1130 03/06/17 1135 03/06/17 1140 03/06/17 1145  BP: (!) 178/99 (!) 198/88 (!) 199/92 (!) 205/106  Pulse: 67 68 69 70  Resp: 12 17 13 14   Temp:      TempSrc:      SpO2: 100% 100% 100% 100%  Weight:      Height:       General: Chronically-ill appearing In no acute distress. Resting in bed. Complains of right great toe pain, left groin pain. Cardiovascular: Regular rate and rhythm. No murmur or rub appreciated. Pulmonary: CTA BL. Unlabored breathing.  Abdomen: Soft and not distended.+bowel sounds.  Extremities: Right foot with improved edema. Extremity feels warmer and more perfused. Right great toe wrapped in clean dry dressings. No purulence or erythema was appreciated on right great toe.  Psych: Mood normal and affect was mood congruent. Responds to questions appropriately.   Assessment/Plan:  Principal Problem:   Severe peripheral arterial disease (HCC) Active Problems:   CKD (chronic kidney disease), stage III   Current smoker   Atherosclerosis of leg with intermittent claudication, R   Constipation   Benign prostatic hyperplasia with urinary obstruction   Hyperlipemia   Ischemic pain of foot, right   Paronychia of great toe, right   Osteomyelitis (HCC)  Severe Peripheral Arterial Disease: S/p right SFA stenting 3/23. VVS may still perform right fem-pop bypass. Will need clarification on whether right great toe amputation could be performed simultaneously for infection control + to guide antibiotics.  - VVS on board - appreciate their assistance in management of this patient! -Neurovascular checks Q-shift -Pain control with Norco/vicodin -Wound care for right great toe -Heparin for limb  ischemia -Plavix  Osteomyelitis of right great toe: Will likely need to be amputated. Currently without purulence or erythema.    Constipation: Resolved. -Glycerin supp -Senokot -Miralax  CKD Stage III: Currently receiving IVF per VVS to offset contrast load. Will continue to monitor renal function.   BPH: No complaints of retention. Continue proscar and flomax  Dispo: Anticipated discharge pending +/- right fem-pop bypass and right great toe amputation.   Derrick Royse, DO 03/06/2017, 11:54 AM Pager: [phone]

## 2017-03-06 NOTE — Plan of Care (Signed)
Problem: Education: Goal: Knowledge of prescribed regimen will improve Outcome: Progressing Will need reinforcement.

## 2017-03-06 NOTE — Op Note (Addendum)
Procedure: Right lower extremity arteriogram angioplasty right superficial femoral artery with drug coated balloon followed by left superficial femoral artery stenting for plaque stabilization  Preoperative diagnosis: Nonhealing wound right foot postoperative diagnosis: Same  Anesthesia: Local with IV sedation  Operative findings: #1 chronic right SFA occlusion recanalized and treated with drug coated balloon 6 x 100 Lutonix with unstable plaque distally subsequent to this and stented with 6 x 120 Innova nitinol stent. 0 residual stenosis  #2 left femoral puncture  #3 severe right lower extremity tibial artery occlusive disease  Operative details: After obtaining informed consent, patient brought to the [place] lab. The patient was placed in supine position the Angio table. Both groins were prepped and draped in usual sterile fashion. Local anesthesia was all treated over the left common femoral artery. Ultrasound was used to identify the left common femoral artery and femoral bifurcation. Local anesthesia was infiltrated over the left common femoral artery. An introducer needle was then used to cannulate the left common femoral artery without difficulty. An 035 versacore wire was threaded up the abdominal aorta under fluoroscopic guidance. A 7 French destination sheath was then advanced over the guidewire into the left iliac system under fluoroscopy. A 5 French crossover catheter was then used to selectively catheterize the right common iliac artery and an 035 angled Glidewire advanced down into the proximal right superficial femoral artery. I initially tried to advance the sheath and dilator up and over the aortic bifurcation over the Glidewire. However this was bowing up into the aorta due to a steep aortic bifurcation. Therefore a 5 French straight catheter was advanced over the angled Glidewire and replaced with an 035 Amplatz wire. I was then able to easily advance the sheath up and over the aortic  bifurcation and into the distal right common femoral artery. The patient was given 8000 units of intravenous heparin. ACT was confirmed greater than 200. The 035 angled Glidewire was then advanced down to the level of the mid right superficial femoral artery. Contrast angiogram was obtained which showed a mid right superficial femoral artery occlusion over a distance of about 8 cm. The 5 French straight catheter was advanced over the Glidewire and using a push technique I was easily able to cross the lesion with minimal manipulation. The 5 French straight catheter was then advanced over the guidewire and contrast antrum performed to make sure we were still in the true lumen. The Glidewire was then exchanged for an 035 versacore wire and this was advanced to the distal popliteal artery. A 5 x 100 angioplasty balloon was then brought up in the operative field. This was then centered on the lesion and inflated to nominal pressure or 30 seconds. The balloon was then deflated and exchanged over the wire for a 6 x 100 Lutonix drug coated balloon. This was advanced and centered on the lesion and inflated to nominal pressure for 3 minutes. It was then deflated and contrast angiogram was performed which showed there was a flap of unstable plaque in the distal segment of the artery which could be seen visibly waving in the artery and I felt this was too unstable to leave in place. Therefore a 6 x 1 20 I Novus stent was brought up on the operative field advanced and centered on the lesion and then deployed and then postdilated with a 6 x 1 20 standard angioplasty balloon. Repeat contrast angiogram showed good apposition of the stent to the wall and the distal intimal flap was now tacked  down. I then proceeded to do a runoff image which showed the runoff was similar to his preoperative angiogram there were several large collaterals in the calf I was unable to determine precisely where the peroneal and posterior tibial arteries  originated and I did not feel it was safe to advance a guidewire into one of these and potentially compromise his runoff. At this point the procedure was concluded the straight catheter and guidewire were removed the sheath was removed down into the left hemipelvis over a guidewire to be pulled after the ACT is less than 175. The patient tolerated the procedure well and there were no complications. The patient was taken to the holding area in stable condition.   Operative management: The patient now has in-line flow to the below-knee popliteal artery.  I discussed these findings with Dr. Trula Slade. He will make a decision on whether or not the patient would need a popliteal to posterior tibial bypass to assist with wound healing or whether observation for now that the patient has good flow to the knee level. The patient will need to be maintained on Plavix and aspirin.  Ruta Hinds, MD [facility] Office: [phone] Pager: [phone]

## 2017-03-06 NOTE — Interval H&P Note (Signed)
History and Physical Interval Note:  03/06/2017 8:43 AM  Derrick Hendrix  has presented today for surgery, with the diagnosis of pvd w/ right foot rest pain  The various methods of treatment have been discussed with the patient and family. After consideration of risks, benefits and other options for treatment, the patient has consented to  Procedure(s): Abdominal Aortogram w/Lower Extremity (N/A) as a surgical intervention .  The patient's history has been reviewed, patient examined, no change in status, stable for surgery.  I have reviewed the patient's chart and labs.  Questions were answered to the patient's satisfaction.     Ruta Hinds

## 2017-03-06 NOTE — Progress Notes (Signed)
  Date: 03/06/2017  Patient name: Derrick Hendrix  Medical record number: [id number]  Date of birth: 1941/02/27   This patient's plan of care was discussed with the house staff. Please see their note for complete details. I concur with their findings.  Patient is sp stents by Dr. Oneida Alar today. Dr. Trula Slade to decide on need for bypass to restore flow to distal extremity. He also has osteomyelitis that in my opinion will need amputation to control. This latter surgery certainly should be done once blood flow has been optimized. INtraoperative cultures OFF abx will help guide rational specific abx therapy. He also needs to stop smoking.    Patient   Truman Hayward, MD 03/06/2017, 6:30 PM

## 2017-03-06 NOTE — Progress Notes (Signed)
47fr sheath aspirated and removed from LFA manual pressure applied for 30 minutes. Groin level 0. Tegaderm dressing applied. Swelling noted, small hematoma starting to form. Manual pressure reapplied to left groin for additional 20 minutes. Groin level 0. No swelling noted. No hematoma present.  Bilateral dp and pt pulses present with doppler.  Bedrest instructions given.  Bedrest begins at 12:25:00

## 2017-03-06 NOTE — H&P (View-Only) (Signed)
  Progress Note    03/05/2017 12:39 PM * No surgery date entered *  Subjective:  No complaints    Vitals:   03/04/17 2116 03/05/17 0409  BP: (!) 147/72 (!) 142/81  Pulse: 80 (!) 58  Resp: 18 18  Temp: 98.2 F (36.8 C) 98.6 F (37 C)    Physical Exam: Lungs:  Non labored Extremities:  Right great toe with wound on toenail bed.  Minimal drainage.  CBC    Component Value Date/Time   WBC 7.7 03/03/2017 0204   RBC 3.79 (L) 03/03/2017 0204   HGB 11.3 (L) 03/03/2017 0204   HCT 34.9 (L) 03/03/2017 0204   PLT 290 03/03/2017 0204   MCV 92.1 03/03/2017 0204   MCH 29.8 03/03/2017 0204   MCHC 32.4 03/03/2017 0204   RDW 14.6 03/03/2017 0204   LYMPHSABS 1.2 03/02/2016 1815   MONOABS 0.6 03/02/2016 1815   EOSABS 0.2 03/02/2016 1815   BASOSABS 0.0 03/02/2016 1815    BMET    Component Value Date/Time   NA 138 03/03/2017 0204   NA 140 02/13/2017 1055   K 5.0 03/03/2017 0204   CL 104 03/03/2017 0204   CO2 22 03/03/2017 0204   GLUCOSE 80 03/03/2017 0204   BUN 23 (H) 03/03/2017 0204   BUN 37 (H) 02/13/2017 1055   CREATININE 1.36 (H) 03/03/2017 0204   CREATININE 1.61 (H) 02/06/2015 1042   CALCIUM 8.7 (L) 03/03/2017 0204   GFRNONAA 48 (L) 03/03/2017 0204   GFRNONAA 37 (L) 10/20/2014 0935   GFRAA 56 (L) 03/03/2017 0204   GFRAA 43 (L) 10/20/2014 0935    INR No results found for: INR   Intake/Output Summary (Last 24 hours) at 03/05/17 1239 Last data filed at 03/05/17 1000  Gross per 24 hour  Intake              480 ml  Output              925 ml  Net             -445 ml     Assessment:  76 y.o. male is s/p:  Aortogram with lower extremity runoff  2 days post op  Plan: -pt doing well this morning -dressing removed from great toe and replaced with wet to dry dressing-order placed for bid dressing changes.  Please use very little tape as this is painful to patient -Dr. Trula Slade discussed with pt about going to [facility] lab tomorrow for aortogram and possible intervention  right leg. -npo after MN and consent   Leontine Locket, PA-C [facility] [phone] 03/05/2017 12:39 PM   I agree wiht the above.  I discussed with the patient that we will attempt to cross his right SFA occlusion tomorrow to improve bloodflow to his foot.  He understands this is a limb threatening situation.  We discussed that if the lesion can not be crossed, he will need a femoral to PT bypass.  He has cardiac clearance.  Vein mapping shows an adequate saphenous vein.  Angiography is scheduled for tomorrow with Dr. Marilynne Drivers

## 2017-03-06 NOTE — Progress Notes (Signed)
Pt to remain on bedrest until 1830.

## 2017-03-07 LAB — BASIC METABOLIC PANEL
Anion gap: 8 (ref 5–15)
BUN: 20 mg/dL (ref 6–20)
CHLORIDE: 104 mmol/L (ref 101–111)
CO2: 25 mmol/L (ref 22–32)
Calcium: 8.5 mg/dL — ABNORMAL LOW (ref 8.9–10.3)
Creatinine, Ser: 1.41 mg/dL — ABNORMAL HIGH (ref 0.61–1.24)
GFR calc Af Amer: 54 mL/min — ABNORMAL LOW (ref >60)
GFR calc non Af Amer: 46 mL/min — ABNORMAL LOW (ref >60)
GLUCOSE: 114 mg/dL — AB (ref 65–99)
POTASSIUM: 4.7 mmol/L (ref 3.5–5.1)
Sodium: 137 mmol/L (ref 135–145)

## 2017-03-07 LAB — CBC
HEMATOCRIT: 31.9 % — AB (ref 39.0–52.0)
Hemoglobin: 10 g/dL — ABNORMAL LOW (ref 13.0–17.0)
MCH: 29.1 pg (ref 26.0–34.0)
MCHC: 31.3 g/dL (ref 30.0–36.0)
MCV: 92.7 fL (ref 78.0–100.0)
Platelets: 269 10*3/uL (ref 150–400)
RBC: 3.44 MIL/uL — ABNORMAL LOW (ref 4.22–5.81)
RDW: 14.8 % (ref 11.5–15.5)
WBC: 8 10*3/uL (ref 4.0–10.5)

## 2017-03-07 LAB — HEPATITIS C ANTIBODY (REFLEX): HCV AB: 0.1 {s_co_ratio} (ref 0.0–0.9)

## 2017-03-07 LAB — HEPATITIS B SURFACE ANTIGEN: Hepatitis B Surface Ag: NEGATIVE

## 2017-03-07 LAB — HCV COMMENT:

## 2017-03-07 MED ORDER — FLUDROCORTISONE ACETATE 0.1 MG PO TABS
0.2000 mg | ORAL_TABLET | Freq: Every day | ORAL | Status: DC
  Filled 2017-03-07: qty 2

## 2017-03-07 MED ORDER — POLYETHYLENE GLYCOL 3350 17 G PO PACK
17.0000 g | PACK | Freq: Every day | ORAL | Status: DC
  Administered 2017-03-07: 17 g via ORAL
  Filled 2017-03-07: qty 1

## 2017-03-07 MED ORDER — HYDROCODONE-ACETAMINOPHEN 5-325 MG PO TABS: 1.0000 | ORAL_TABLET | ORAL | 0 refills | Status: DC | PRN

## 2017-03-07 NOTE — Progress Notes (Addendum)
  Date: 03/07/2017  Patient name: Coley Kulikowski  Medical record number: [id number]  Date of birth: [birth year]   This patient's plan of care was discussed with the house staff. Please see their note for complete details. I concur with their findings.  BP elevated today.  Discussed with PCP, Dr. Benjamine Mola, and patient has difficulty with dizziness and falling when SBP drops below 140 in the outpatient setting.  However, with his significant vascular disease, we should likely target an SBP of 150 to help offset the damage to his vascular system.  He is currently only on PRN medications; he received 1 dose of PRN hydralazine and 3 doses of PRN labetalol yesterday.  Consideration for a long term oral medication that does not drop his BP too low should be had prior to considering discharge.  I would err on the side of starting a low dose amlodipine or diuretic if he tolerates this type of medication.  Review of outpatient notes to see what he has tolerated in the past would be prudent.  I discussed this with the resident team.   It is unclear the course or need for further surgery from a vascular standpoint at this time; will need coordination with the vascular team.    Sid Falcon, MD 03/07/2017, 11:55 AM

## 2017-03-07 NOTE — Progress Notes (Addendum)
   Subjective: Derrick Hendrix was seen and evaluated today at bedside. He complains of muscle spasms in his right calf and toes. He reports he is self-continuing his bed rest out of concern for bleeding at the cath site. He also reports still being on a liquid diet and wonders if this could be advanced. Otherwise he reports his lower extremity feels much better and actually his right great toe pain is improved. No other complaints.   Objective:  Vital signs in last 24 hours: Vitals:   03/06/17 1350 03/06/17 1511 03/06/17 2000 03/07/17 0640  BP: (!) 160/76 (!) 178/87 (!) 141/49 (!) 159/81  Pulse: 68 67 86 75  Resp:   18 18  Temp:   97.4 F (36.3 C) 98.1 F (36.7 C)  TempSrc:   Oral Oral  SpO2:   99% 97%  Weight:      Height:       General: Chronically-ill appearing In no acute distress. Resting comfortably in bed. Pleasant and interactive.  Cardiovascular: RRR, No murmur or rub appreciated. Pulmonary: CTA BL, without wheezing, crackles or rhonchi. Unlabored breathing.  Abdomen: Soft, +bowel sounds.  Extremities: No edema in R foot. Extremity feels warmer and more perfused. Right great toe without nailbed. No purulence or fluctuance appreciated.  Psych: Mood normal and affect was mood congruent. Responds to questions appropriately.   Assessment/Plan:  Principal Problem:   Severe peripheral arterial disease (HCC) Active Problems:   CKD (chronic kidney disease), stage III   Current smoker   Atherosclerosis of leg with intermittent claudication, R   Constipation   Benign prostatic hyperplasia with urinary obstruction   Hyperlipemia   Ischemic pain of foot, right   Paronychia of great toe, right   Osteomyelitis (HCC)   Preop cardiovascular exam  Severe Peripheral Arterial Disease, s/p right SFA stenting 3/23: Blood flow improved to LE as confirmed by doppler this morning. VS may still perform right fem-pop bypass however wonder if pt may have simultaneous right great toe amputation?  This was discussed with Dr. Donzetta Matters of [facility] who will have pt follow-up in clinic in 2 weeks for re-evaluation.  - [facility] on board - appreciate their assistance in management of this patient -Neurovascular checks Q-shift -Pain control with Norco/vicodin -Wound care for right great toe -Heparin for limb ischemia -Plavix -[facility] managing BP currently, HTN will need to be addressed at his HFU.   Osteomyelitis of right great toe: Will likely need to be amputated. Currently without purulence or erythema. Not currently on abx, hopeful to get culture data from amputation to guide therapy. Does not have any systemic signs of infection. Will be monitored in the outpatient setting as pt will require outpt surgery.  Constipation: Resolved. Will continue his current bowel regimen.  -Senokot -Miralax - holding today in setting of all liquid diet for 1-2 days  CKD Stage III: Currently receiving IVF per [facility] to offset contrast load. Cr at baseline today at 1.4. Will continue to monitor renal function in setting of recent contrast load.   BPH: Continue proscar and flomax  Dispo: Anticipated discharge pending +/- right fem-pop bypass and right great toe amputation.   Derrick Meddaugh, DO 03/07/2017, 7:09 AM Pager: [phone]

## 2017-03-07 NOTE — Progress Notes (Addendum)
Patient in a stable condition, patient refused wound dressing change discharge education reviewed with patient he verbalised understanding, patient belongings at bedside, taxi voucher given to patient, taxi called iv removed , tele dc ccmd notified, patient taken off the unit on a wheelchair to the main entrance by a Stage manager.

## 2017-03-07 NOTE — Progress Notes (Addendum)
[facility]  Subjective  - Doing well over all.   Objective (!) 167/65 82 98.6 F (37 C) (Oral) 18 98%  Intake/Output Summary (Last 24 hours) at 03/07/17 1038 Last data filed at 03/07/17 0446  Gross per 24 hour  Intake           453.33 ml  Output             1740 ml  Net         -1286.67 ml    Left great toe nail bed wound no change Doppler signal AT/strong PT   Assessment/Planning: POD # 1 Procedure: Right lower extremity arteriogram angioplasty right superficial femoral artery with drug coated balloon followed by left superficial femoral artery stenting for plaque stabilization Plavix daily F/U with Dr. Trula Slade 2 weeks with ABI's in office  Theda Sers [place] 03/07/2017 10:38 AM --  Laboratory Lab Results:  Recent Labs  03/06/17 0241 03/07/17 0235  WBC 6.5 8.0  HGB 10.7* 10.0*  HCT 33.9* 31.9*  PLT 265 269   BMET  Recent Labs  03/06/17 0241 03/07/17 0235  NA 138 137  K 4.7 4.7  CL 105 104  CO2 24 25  GLUCOSE 113* 114*  BUN 28* 20  CREATININE 1.53* 1.41*  CALCIUM 8.4* 8.5*    COAG Lab Results  Component Value Date   INR 1.01 03/06/2017   No results found for: PTT   I have independently interviewed patient and agree with PA assessment and plan above. Will check wound and abi in 2 weeks in office, ok for d/c from vascular standpoint.   Ahilyn Nell C. Donzetta Matters, MD [facility] Office: [phone] Pager: [phone]

## 2017-03-07 NOTE — Clinical Social Work Note (Signed)
CSW notified by bedside RN, pt has no transportation home. CSW spoke to pt about transportation. Pt reports he cannot find a ride home and doesn't have the resources to get home. Pt can ambulate to get in/out of taxi.  CSW authorized a taxi.  Pt has no other needs at this time.  Derrill Bagnell B. Luellen Howson,MSW, LCSWA Clinical Social Work Dept Weekend Social Worker [phone] 1:21 PM

## 2017-03-07 NOTE — Plan of Care (Signed)
Patients 20G saline lock IV in right forearm was flushed at this time. IV appears to be patent, clean, dry and intact.

## 2017-03-09 ENCOUNTER — Telehealth: Payer: Self-pay | Admitting: Surgery

## 2017-03-09 MED FILL — Lidocaine HCl Local Preservative Free (PF) Inj 1%: INTRAMUSCULAR | Qty: 30 | Status: AC

## 2017-03-09 NOTE — Telephone Encounter (Signed)
spoke to pt for appts 4/18 [place] 4/23 PO, unsure he truly understands he needs to come to these appts. Mailing letter to verified home address

## 2017-03-09 NOTE — Telephone Encounter (Signed)
-----   Message from Mena Goes, RN sent at 03/07/2017  6:22 PM EDT ----- Regarding: 4 weeks w/ ABI   ----- Message ----- From: Ulyses Amor, PA-C Sent: 03/07/2017  10:42 AM To: [facility] Charge Pool  S/P angiogram with intervention f/u with [facility] in 4 weeks need ABI's, has right great toe nail bed wound.

## 2017-03-11 ENCOUNTER — Telehealth: Payer: Self-pay | Admitting: Internal Medicine

## 2017-03-11 NOTE — Telephone Encounter (Signed)
APT. REMINDER CALL, LMTCB °

## 2017-03-12 ENCOUNTER — Encounter (INDEPENDENT_AMBULATORY_CARE_PROVIDER_SITE_OTHER): Payer: Self-pay

## 2017-03-12 ENCOUNTER — Ambulatory Visit (INDEPENDENT_AMBULATORY_CARE_PROVIDER_SITE_OTHER): Payer: Medicare Other | Admitting: Internal Medicine

## 2017-03-12 VITALS — BP 134/75 | HR 98 | Temp 97.6°F | Wt 194.8 lb

## 2017-03-12 DIAGNOSIS — M86171 Other acute osteomyelitis, right ankle and foot: Secondary | ICD-10-CM | POA: Diagnosis not present

## 2017-03-12 DIAGNOSIS — B9689 Other specified bacterial agents as the cause of diseases classified elsewhere: Secondary | ICD-10-CM

## 2017-03-12 DIAGNOSIS — Z5189 Encounter for other specified aftercare: Secondary | ICD-10-CM | POA: Diagnosis present

## 2017-03-12 DIAGNOSIS — I708 Atherosclerosis of other arteries: Secondary | ICD-10-CM

## 2017-03-12 DIAGNOSIS — F17210 Nicotine dependence, cigarettes, uncomplicated: Secondary | ICD-10-CM | POA: Diagnosis not present

## 2017-03-12 DIAGNOSIS — I739 Peripheral vascular disease, unspecified: Secondary | ICD-10-CM

## 2017-03-12 NOTE — Discharge Summary (Signed)
Name: Quantae Martel MRN: [id number] DOB: August 30, 1941 76 y.o. PCP: [name], MD  Date of Admission: 02/27/2017  2:00 PM Date of Discharge: 03/07/2017 Attending Physician: Gilles Chiquito, MD  Discharge Diagnosis: 1. Severe peripheral arterial disease 2. Osteomyelitis of right great toe 3. CKD stage 3  Principal Problem:   Severe peripheral arterial disease (HCC) Active Problems:   CKD (chronic kidney disease), stage III   Current smoker   Atherosclerosis of leg with intermittent claudication, R   Constipation   Benign prostatic hyperplasia with urinary obstruction   Hyperlipemia   Ischemic pain of foot, right   Paronychia of great toe, right   Osteomyelitis ([place])   Preop cardiovascular exam  Discharge Medications: Allergies as of 03/07/2017      Reactions   Aspirin Other (See Comments)   Nosebleeds       Medication List    STOP taking these medications   doxycycline 100 MG tablet Commonly known as:  VIBRA-TABS     TAKE these medications   acetaminophen 500 MG tablet Commonly known as:  TYLENOL Take 2 tablets (1,000 mg total) by mouth every 6 (six) hours as needed for mild pain (or Fever >/= 101).   atorvastatin 80 MG tablet Commonly known as:  LIPITOR Take 1 tablet (80 mg total) by mouth daily.   clopidogrel 75 MG tablet Commonly known as:  PLAVIX Take 1 tablet (75 mg total) by mouth daily.   ergocalciferol 50000 units capsule Commonly known as:  VITAMIN D2 Take 50,000 Units by mouth once a week.   finasteride 5 MG tablet Commonly known as:  PROSCAR Take 1 tablet (5 mg total) by mouth daily.   fludrocortisone 0.1 MG tablet Commonly known as:  FLORINEF Take 2 tablets (0.2 mg total) by mouth daily in the morning.   furosemide 40 MG tablet Commonly known as:  LASIX TAKE 1 TABLET (40 MG TOTAL) BY MOUTH DAILY.   ipratropium 0.06 % nasal spray Commonly known as:  ATROVENT Place 2 sprays into the nose 3 (three) times daily. What changed:  when  to take this  reasons to take this   nitroGLYCERIN 0.4 MG SL tablet Commonly known as:  NITROSTAT Place 1 tablet (0.4 mg total) under the tongue every 5 (five) minutes x 3 doses as needed for chest pain.   polyethylene glycol packet Commonly known as:  MIRALAX Take 17 g by mouth daily as needed (constipation). What changed:  reasons to take this   tamsulosin 0.4 MG Caps capsule Commonly known as:  FLOMAX Take 1 capsule (0.4 mg total) by mouth daily after supper.      Disposition and follow-up:   Mr.Nuno Riner was discharged from [facility] in stable condition.  At the hospital follow up visit please address:  1.  Peripheral vascular disease & right great toe osteomyelitis: He will require close follow-up in the outpatient setting for both his PVD and right great toe osteo. He had stent placement during hospitalization however might require fem-pop bypass. He was not discharged with antibiotics due to his stability and also in hopes that intra-operative cultures could guide therapy. PLEASE ENSURE FOLLOW UP WITH VASCULAR SURGERY AND THAT HIS RIGHT GREAT TOE IS NOT OVERTLY INFECTED.  2.  Labs / imaging needed at time of follow-up: None.  3.  Pending labs/ test needing follow-up: None.  Follow-up Appointments: Follow-up Information    Annamarie Major, MD Follow up in 4 week(s).   Specialties:  Vascular Surgery, Cardiology Why:  office will call Contact information: [place] [address] [phone]        Hinton Lovely, MD. Schedule an appointment as soon as possible for a visit in 1 week(s).   Specialty:  Internal Medicine Contact information: [place] [address] [phone]          Hospital Course by problem list: Principal Problem:   Severe peripheral arterial disease ([place]) Active Problems:   CKD (chronic kidney disease), stage III   Current smoker   Atherosclerosis of leg with intermittent claudication,  R   Constipation   Benign prostatic hyperplasia with urinary obstruction   Hyperlipemia   Ischemic pain of foot, right   Paronychia of great toe, right   Osteomyelitis (HCC)   Preop cardiovascular exam   1. Severe peripheral arterial disease This is a 76 y/o M admitted from clinic for concerns of ischemic right limb. ABIs show severe disease on the right (0.48) and arteriogram showed severe diffuse occlusions. [facility] on board who stented this however belief pt may require fem-pop bypass in the future.  2. Right great toe Osteomyelitis He recently had his right great toenail removed and an abscess drained underneath podiatry 1 week prior to admission. He continued to endorse throbbing pain of his right great toe throughout hospitalization. His vital signs were stable and he was afebrile. He was seen by [facility] who will amputate in the outpatient setting.   Discharge Vitals:   BP (!) 167/65 (BP Location: Left Arm)   Pulse 82   Temp 98.6 F (37 C) (Oral)   Resp 18   Ht 6\' 2"  (1.88 m)   Wt 190 lb 4.1 oz (86.3 kg)   SpO2 98%   BMI 24.43 kg/m   Pertinent Labs, Studies, and Procedures:  MR right foot: Severe marrow edema of tip of first distal phalanx concerning for osteomyelitis NM Stress test: Moderate size and intensity, fixed inferoapical, and apical lateral defect - likely scar. No significant reversible ischemia. LVEF 54% with inferior hypokinesis. This is a low risk study. ABI: severe disease in RLE. Norma Left Arteriogram: Right SFA chronic occlusion with extensive profunda collaterals reconstitute distal SFA. Patent left femoropopliteal artery with inadequate imaging of left tibial arteries though variable degree of disease is suggested in all tibial arteries. ECHO: LVEF 65-70% with grade 1 diastolic dysfunction 1/91: Stenting of SFA stenosis  Discharge Instructions: Mr. Opiela it is important that you follow-up in the internal medicine clinic as well as with the vascular surgeons  regarding the blood flow in your leg as well as the infection in the bone of your right big toe. You will require amputation of this toe however the surgeons feel this can be done in the outpatient setting.   SignedEinar Gip, DO 03/12/2017, 2:57 PM   Pager: [phone]

## 2017-03-12 NOTE — Progress Notes (Signed)
CC: toe pain  HPI:  [name] is a 75 y.o. with a PMH of PAD, CVA, HTN, HLD, CKD presenting to clinic for hospital follow up for PAD and right great toe osteomyelitis.  Patient was admitted from [facility] clinic on 3/16 for concern over ischemia of right great toe. While hospitalized patient was evaluated by vascular surgery and underwent bil SFA stenting with resultant in-line flow to below-knee popliteal artery. Intraoperative study also showed severe right tibial artery stenosis. MRI of his foot showed evidence of osteomyelitis. A decision was made to hold off on starting antibiotics as patient's pain and swelling improved after stenting and patient was to follow up closely with vascular surgery outpatient for decision on popliteal-post tibial bypass; if he is to get a bypass, an amputation would be completed at the same time and bone biopsy and culture would be used to guide antibiotic therapy.   Patient states that he is still having pain and swelling in his right great toe and foot but that is has significantly improved from before. He does endorse new right anterior thigh claudication that is relieved by rest. Patient denies fevers, chills, drainage from his foot or toe, and he is able to lie flat and even elevate his legs without increased pain.   Please see problem based Assessment and Plan for status of patients chronic conditions.  Past Medical History:  Diagnosis Date  . Chronic kidney disease (CKD), stage III (moderate)   . Daily headache    "lately" (04/17/2016)  . Depression   . Heart murmur   . High cholesterol   . Hyperkalemia 08/2014  . Hypertension   . PAD (peripheral artery disease) ([place])   . Stroke ([place]) 03/2016   hx of PAD; j"ust lots of headaches since" (04/17/2016)    Review of Systems:   Review of Systems  Constitutional: Negative for chills and fever.  Respiratory: Negative for shortness of breath.   Cardiovascular: Positive for claudication and leg swelling  (right foot). Negative for chest pain.  Neurological: Negative for dizziness, sensory change, focal weakness and headaches.    Physical Exam:  Vitals:   03/12/17 0936  BP: 134/75  Pulse: 98  Temp: 97.6 F (36.4 C)  TempSrc: Oral  SpO2: 100%  Weight: 194 lb 12.8 oz (88.4 kg)   Physical Exam  Constitutional: He is oriented to person, place, and time. He appears well-developed and well-nourished. No distress.  Eyes: EOM are normal.  Neck: Normal range of motion. Neck supple.  Cardiovascular: Normal rate, regular rhythm and normal heart sounds.   DP and PT pulses not palpable on exam; triphasic doppler on bilateral DP, biphasic on bil PT. Both feet warm.   Left femoral cath site is clean and dry; mild tenderness.  Pulmonary/Chest: Effort normal and breath sounds normal. He has no wheezes. He has no rales.  Abdominal: Soft. Bowel sounds are normal. He exhibits no distension. There is no tenderness.  Musculoskeletal: Normal range of motion. He exhibits edema (mild edema of right foot) and tenderness (at distal right great toe; no drainage).  Neurological: He is alert and oriented to person, place, and time.  Skin: Skin is warm and dry. Capillary refill takes less than 2 seconds. He is not diaphoretic.  Psychiatric: He has a normal mood and affect. His behavior is normal. Judgment and thought content normal.    Assessment & Plan:   See Encounters Tab for problem based charting.   Patient discussed with Dr. Ihor Austin  Jari Favre, MD Internal Medicine PGY1

## 2017-03-12 NOTE — Patient Instructions (Signed)
It was nice meeting you today! I'm glad you're feeling better!  Please keep your appointment with the vascular doctor as they will keep checking how well your blood vessels are doing.   If you have increase in pain, swelling, fever, please come back to the office and we will evaluate you.

## 2017-03-13 ENCOUNTER — Encounter: Payer: Self-pay | Admitting: Internal Medicine

## 2017-03-13 NOTE — Assessment & Plan Note (Signed)
Patient found to have osteomyelitis of distal right great toe in setting of severe PAD. During same hospitalization he had bilateral SFA stenting with improvement in right foot blood flow. Patient states that he is still having pain and swelling in his right great toe and foot but that is has significantly improved from before. Patient denies fevers, chills, drainage from his foot or toe, and he is able to lie flat and even elevate his legs without increased pain.  During hospitalization, decision was made to hold off on starting antibiotics as patient's pain and swelling improved after stenting and patient was to follow up closely with vascular surgery outpatient for decision on popliteal-post tibial bypass; if he is to get a bypass, an amputation would be completed at the same time and bone biopsy and culture would be used to guide antibiotic therapy.  Exam reveals focal tenderness of distal right great toe, no increased warmth concerning for spreading of infection. Has mild swelling of right foot compared to left.   Plan: --patient to follow up with vascular surgery --advised to return to clinic sooner if symptoms progress

## 2017-03-13 NOTE — Assessment & Plan Note (Addendum)
Patient was admitted from [facility] clinic on 3/16 for concern over ischemia of right great toe. While hospitalized patient was evaluated by vascular surgery and underwent bil SFA stenting with resultant in-line flow to below-knee popliteal artery. Intraoperative study also showed severe right tibial artery stenosis. MRI of his foot showed evidence of osteomyelitis. A decision was made to hold off on starting antibiotics as patient's pain and swelling improved after stenting and patient was to follow up closely with vascular surgery outpatient for decision on popliteal-post tibial bypass; if he is to get a bypass, an amputation would be completed at the same time and bone biopsy and culture would be used to guide antibiotic therapy.   Patient states that he is still having pain and swelling in his right great toe and foot but that is has significantly improved from before. He does endorse new right anterior thigh claudication that is relieved by rest. Patient denies fevers, chills, drainage from his foot or toe, and he is able to lie flat and even elevate his legs without increased pain.  Exam reveals warm distal LEs bil; no palpable pulses but doppler appreciated DP and PT pulses bilaterally.  Plan: --follow up with vasc surgery for amputation +/- popliteal-post tibial bypass

## 2017-03-16 NOTE — Progress Notes (Signed)
Internal Medicine Clinic Attending  Case discussed with Dr. Svalina  at the time of the visit.  We reviewed the resident's history and exam and pertinent patient test results.  I agree with the assessment, diagnosis, and plan of care documented in the resident's note.  

## 2017-03-26 ENCOUNTER — Encounter: Payer: Self-pay | Admitting: Surgery

## 2017-03-27 NOTE — Progress Notes (Signed)
STROKE-AF month 12 follow up visit completed while patient was in hospital.

## 2017-04-01 ENCOUNTER — Ambulatory Visit (HOSPITAL_COMMUNITY): Payer: Medicare Other

## 2017-04-06 ENCOUNTER — Encounter: Payer: Medicare Other | Admitting: Surgery

## 2017-04-15 IMAGING — CT CT HEAD W/O CM
2 series · 16 of 30 positions shown, 20 images · non-contrast
Comparison: 02/27/2012

CLINICAL DATA: Dizziness

EXAM:
CT HEAD WITHOUT CONTRAST
TECHNIQUE: Contiguous axial images were obtained from the base of the skull
through the vertex without intravenous contrast.

[Series 201: head w/o, idose (1) · axial · non-contrast · 0.46mm/px · z∈[+73,+193]mm · 13 of 30 slices shown, 17 images]
[im 3/30  brain]
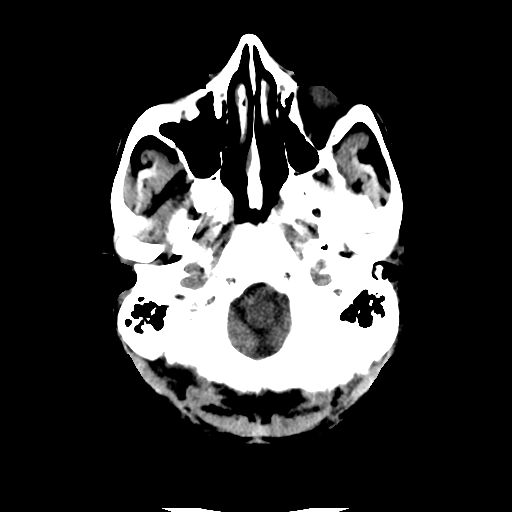
[im 3/30  bone]
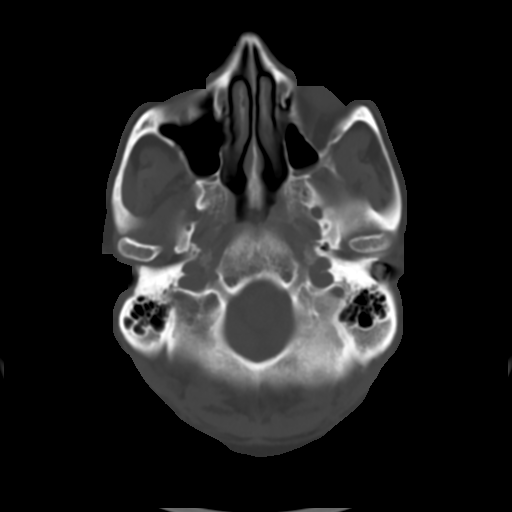
[im 5/30  brain]
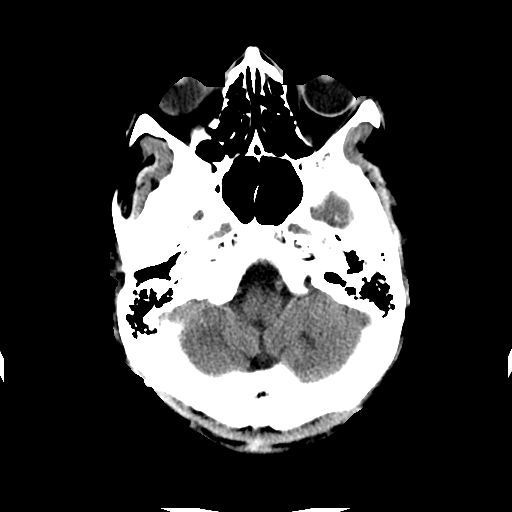
[im 7/30  brain]
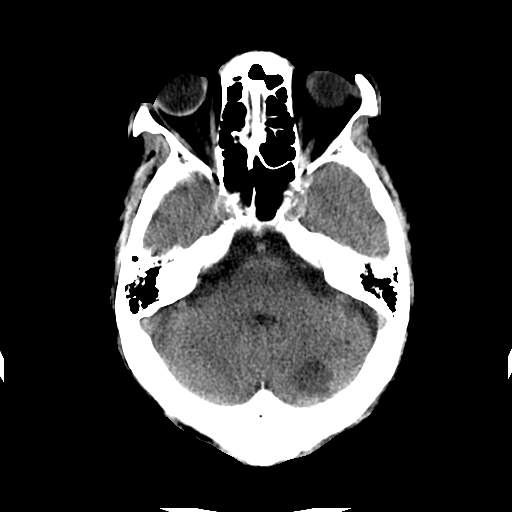
[im 9/30  brain]
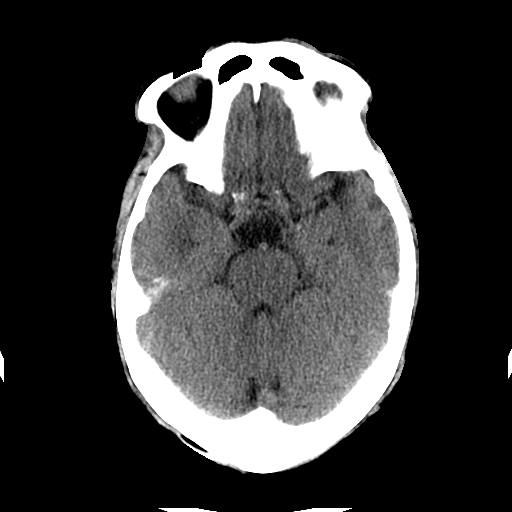
[im 11/30  brain]
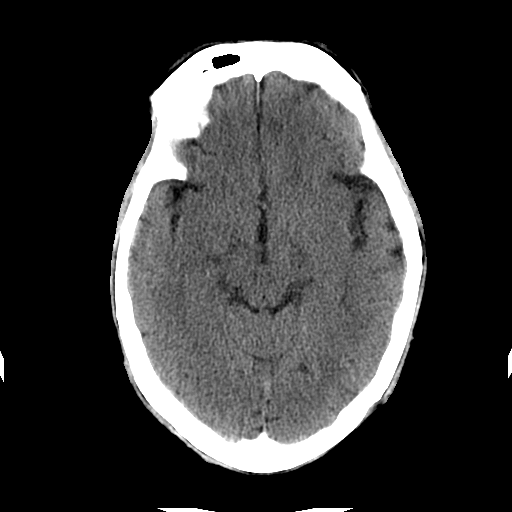
[im 11/30  bone]
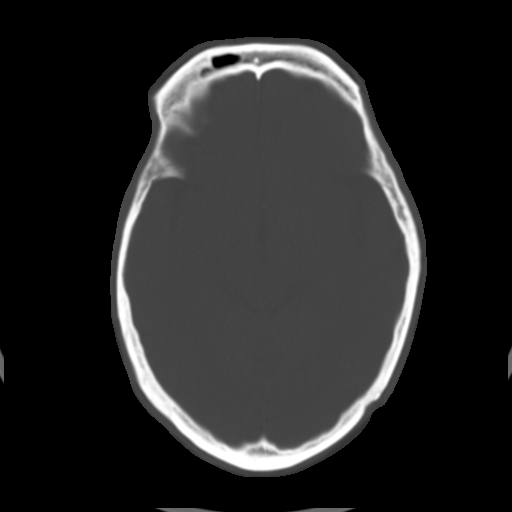
[im 13/30  brain]
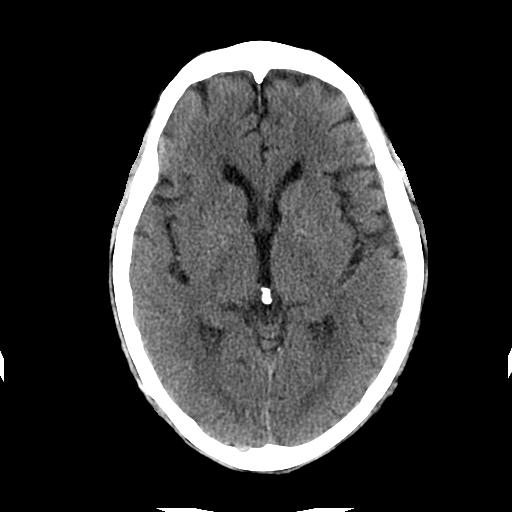
[im 15/30  brain]
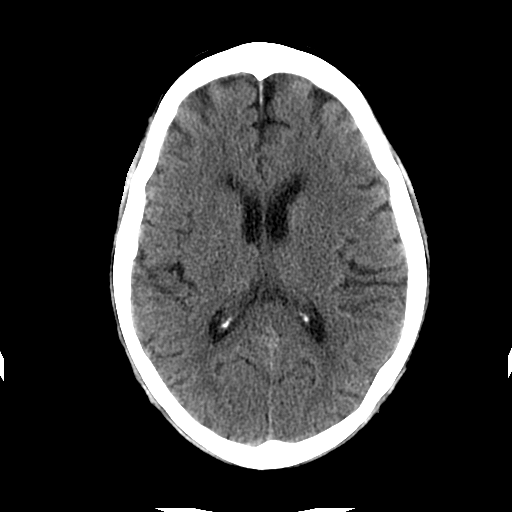
[im 17/30  brain]
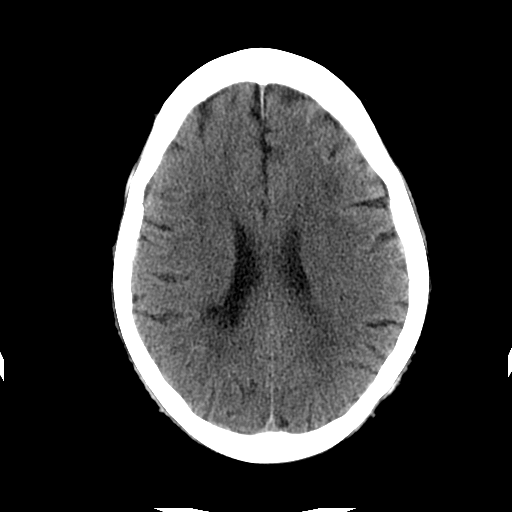
[im 19/30  brain]
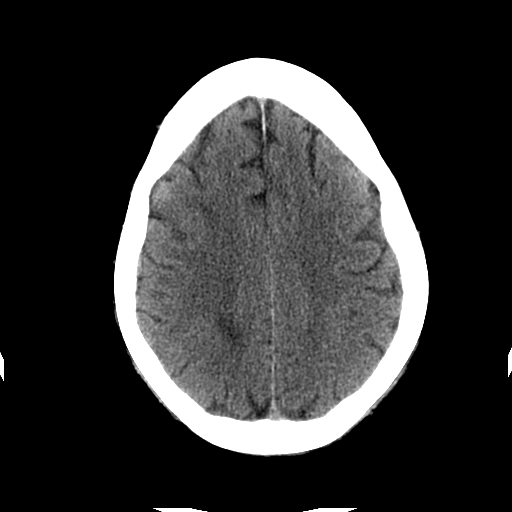
[im 19/30  bone]
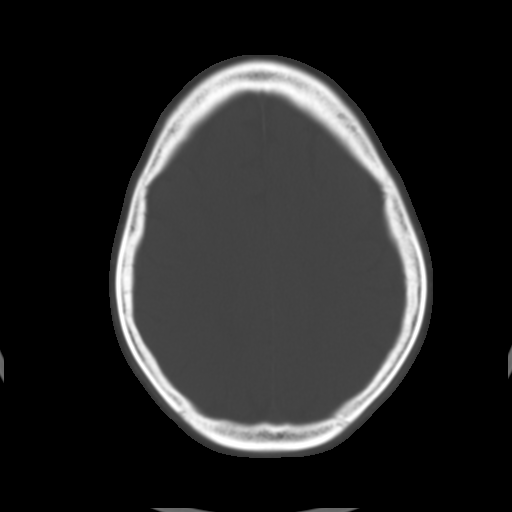
[im 21/30  brain]
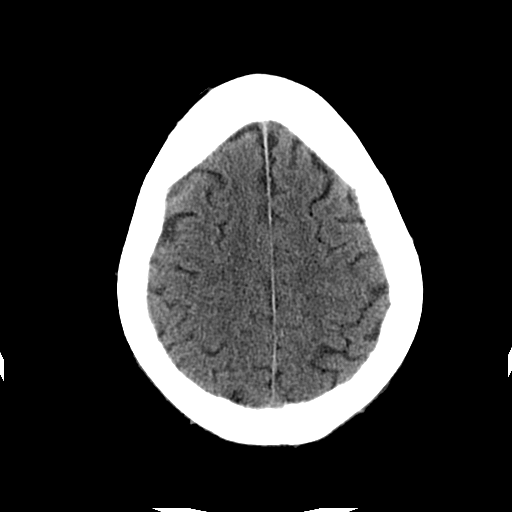
[im 23/30  brain]
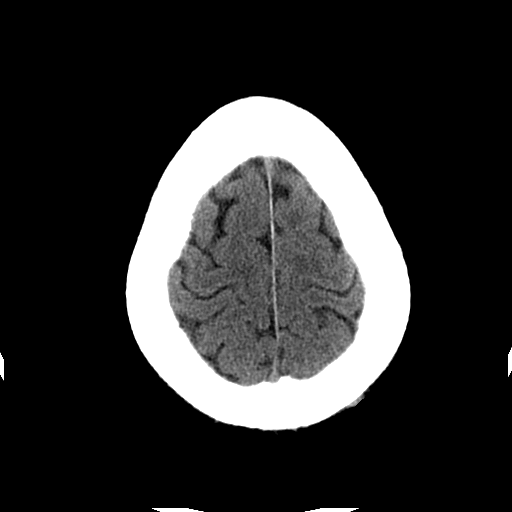
[im 25/30  brain]
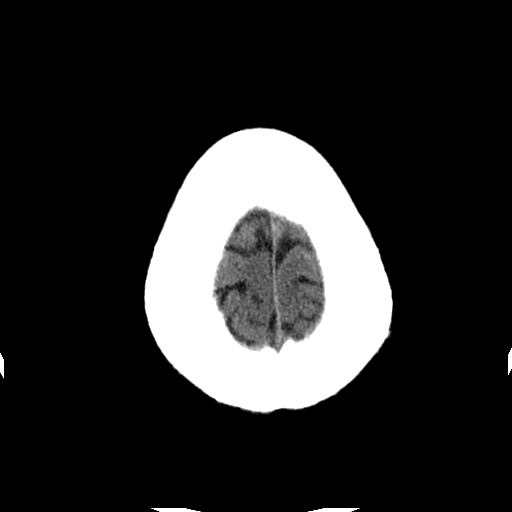
[im 27/30  brain]
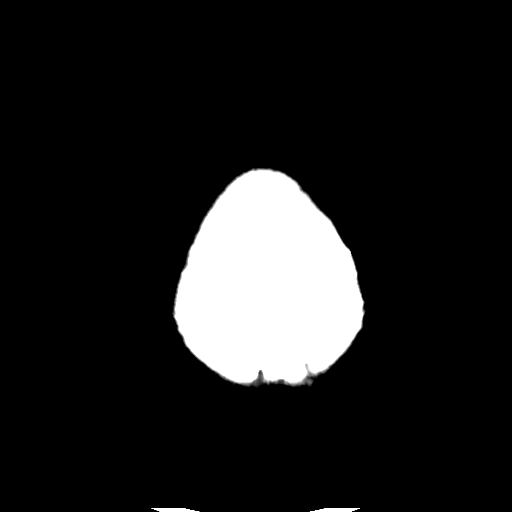
[im 27/30  bone]
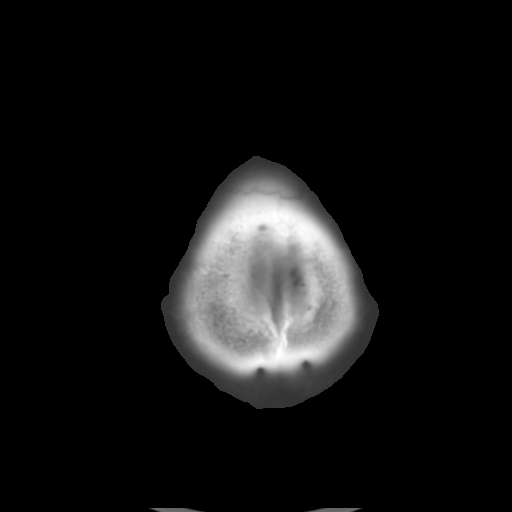

[Series 202: head w/o bone, idose (1) · axial · non-contrast · 0.46mm/px · z∈[+73,+113]mm · 3 of 30 slices shown]
[im 3/30  bone]
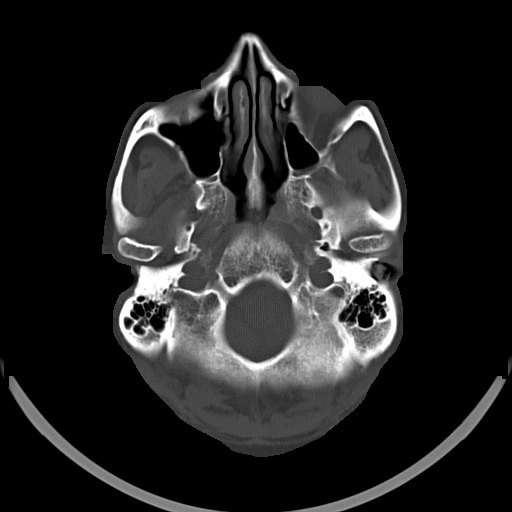
[im 7/30  bone]
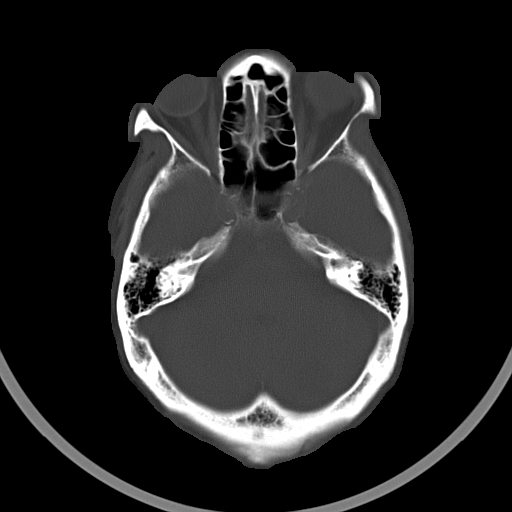
[im 11/30  bone]
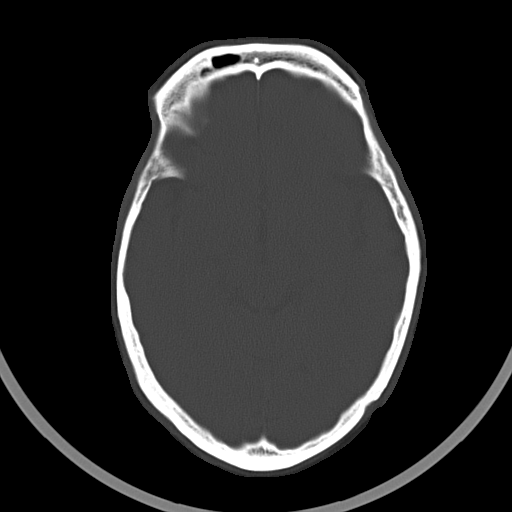

[16 of 30 positions shown; findings below may reference images not displayed]

FINDINGS: Bony calvarium is intact.

Scattered areas of decreased attenuation are noted in the deep white
matter bilaterally consistent with chronic white matter ischemic
change. There is a 2.2 x 02.1 cm rounded hypodensity in the
posterior aspect of the left cerebellar hemisphere consistent with
an acute infarct. No other focal abnormality is seen.
IMPRESSION: Focal infarct in the posterior aspect of the left cerebellum as
described.

## 2017-05-01 ENCOUNTER — Other Ambulatory Visit: Payer: Self-pay | Admitting: Internal Medicine

## 2017-05-06 ENCOUNTER — Encounter: Payer: Self-pay | Admitting: Surgery

## 2017-05-12 ENCOUNTER — Ambulatory Visit: Payer: Medicare Other | Admitting: Nurse Practitioner

## 2017-05-18 ENCOUNTER — Ambulatory Visit (INDEPENDENT_AMBULATORY_CARE_PROVIDER_SITE_OTHER): Payer: Medicare Other | Admitting: Surgery

## 2017-05-18 ENCOUNTER — Encounter: Payer: Self-pay | Admitting: Surgery

## 2017-05-18 ENCOUNTER — Ambulatory Visit (HOSPITAL_COMMUNITY)
Admission: RE | Admit: 2017-05-18 | Discharge: 2017-05-18 | Disposition: A | Discharge status: Home / Self Care | Payer: Medicare Other | Source: Ambulatory Visit | Attending: Surgery | Admitting: Surgery

## 2017-05-18 VITALS — BP 185/94 | HR 84 | Temp 97.0°F | Resp 20 | Ht 74.0 in | Wt 201.0 lb

## 2017-05-18 DIAGNOSIS — M79671 Pain in right foot: Secondary | ICD-10-CM | POA: Insufficient documentation

## 2017-05-18 DIAGNOSIS — I70235 Atherosclerosis of native arteries of right leg with ulceration of other part of foot: Secondary | ICD-10-CM

## 2017-05-18 DIAGNOSIS — I739 Peripheral vascular disease, unspecified: Secondary | ICD-10-CM | POA: Diagnosis not present

## 2017-05-18 DIAGNOSIS — M79672 Pain in left foot: Secondary | ICD-10-CM | POA: Insufficient documentation

## 2017-05-18 NOTE — Progress Notes (Signed)
[facility] [place]  Patient name: Derrick Hendrix MRN: [id number] DOB: 1941/04/01 Sex: male   REASON FOR VISIT:    Follow up right great toe wound  HISOTRY OF PRESENT ILLNESS:    Derrick Hendrix is a 76 y.o. male who initially presented to the emergency department and March 2018 with right great toe pain.  He had a localized abscess under the nail plate of the right fifth toe and underwent nailbed removal by Dr. Amalia Hailey.  He also endorses claudication at approximately 2-3 blocks.  This is in the right leg.  He underwent angiography on 03/06/2017 and had a right superficial femoral artery occlusion treated with drug coated balloon and plasty followed by stenting.  He was noted to have severe tibial disease.  Since his intervention, a states that his toe has improved.  The pain is better.   Patient continues to smoke.  He suffers from chronic renal insufficiency, stage III.  He is on a statin for hypercholesterolemia.  His blood pressure is medically managed.  PAST MEDICAL HISTORY:   Past Medical History:  Diagnosis Date  . Chronic kidney disease (CKD), stage III (moderate)   . Daily headache    "lately" (04/17/2016)  . Depression   . Heart murmur   . High cholesterol   . Hyperkalemia 08/2014  . Hypertension   . PAD (peripheral artery disease) ([place])   . Stroke ([place]) 03/2016   hx of PAD; j"ust lots of headaches since" (04/17/2016)     FAMILY HISTORY:   Family History  Problem Relation Age of Onset  . Hypertension Mother        died age 38  . Diabetes Mother     SOCIAL HISTORY:   Social History  Substance Use Topics  . Smoking status: Current Every Day Smoker    Packs/day: 0.50    Years: 66.00    Types: Cigarettes  . Smokeless tobacco: Never Used     Comment: Sometimes more-nerves.  . Alcohol use No     Comment: 04/17/2016 "nothing in the last 3-4 years"     ALLERGIES:   Allergies  Allergen Reactions  .  Aspirin Other (See Comments)    Nosebleeds      CURRENT MEDICATIONS:   Current Outpatient Prescriptions  Medication Sig Dispense Refill  . acetaminophen (TYLENOL) 500 MG tablet Take 2 tablets (1,000 mg total) by mouth every 6 (six) hours as needed for mild pain (or Fever >/= 101). 30 tablet 0  . atorvastatin (LIPITOR) 80 MG tablet Take 1 tablet (80 mg total) by mouth daily. 90 tablet 1  . clopidogrel (PLAVIX) 75 MG tablet TAKE 1 TABLET (75 MG TOTAL) BY MOUTH DAILY. 30 tablet 1  . ergocalciferol (VITAMIN D2) 50000 units capsule Take 50,000 Units by mouth once a week.    . finasteride (PROSCAR) 5 MG tablet Take 1 tablet (5 mg total) by mouth daily. 90 tablet 1  . fludrocortisone (FLORINEF) 0.1 MG tablet Take 2 tablets (0.2 mg total) by mouth daily in the morning. 180 tablet 1  . furosemide (LASIX) 40 MG tablet TAKE 1 TABLET (40 MG TOTAL) BY MOUTH DAILY. 30 tablet 2  . ipratropium (ATROVENT) 0.06 % nasal spray Place 2 sprays into the nose 3 (three) times daily. (Patient taking differently: Place 2 sprays into the nose daily as needed for rhinitis. ) 15 mL 2  . nitroGLYCERIN (NITROSTAT) 0.4 MG SL tablet Place 1 tablet (0.4 mg total) under the tongue every 5 (five) minutes  x 3 doses as needed for chest pain. 30 tablet 3  . polyethylene glycol (MIRALAX) packet Take 17 g by mouth daily as needed (constipation). (Patient taking differently: Take 17 g by mouth daily as needed for moderate constipation. ) 14 each 0  . tamsulosin (FLOMAX) 0.4 MG CAPS capsule Take 1 capsule (0.4 mg total) by mouth daily after supper. 90 capsule 1   No current facility-administered medications for this visit.     REVIEW OF SYSTEMS:   [X]  denotes positive finding, [ ]  denotes negative finding Cardiac  Comments:  Chest pain or chest pressure:    Shortness of breath upon exertion:    Short of breath when lying flat:    Irregular heart rhythm:        Vascular    Pain in calf, thigh, or hip brought on by ambulation:     Pain in feet at night that wakes you up from your sleep:     Blood clot in your veins:    Leg swelling:         Pulmonary    Oxygen at home:    Productive cough:     Wheezing:         Neurologic    Sudden weakness in arms or legs:     Sudden numbness in arms or legs:     Sudden onset of difficulty speaking or slurred speech:    Temporary loss of vision in one eye:     Problems with dizziness:         Gastrointestinal    Blood in stool:     Vomited blood:         Genitourinary    Burning when urinating:     Blood in urine:        Psychiatric    Major depression:         Hematologic    Bleeding problems:    Problems with blood clotting too easily:        Skin    Rashes or ulcers:        Constitutional    Fever or chills:      PHYSICAL EXAM:   Vitals:   05/18/17 0844 05/18/17 0845  BP: (!) 182/93 (!) 185/94  Pulse: 84   Resp: 20   Temp: 97 F (36.1 C)   TempSrc: Oral   SpO2: 98%   Weight: 201 lb (91.2 kg)   Height: 6\' 2"  (1.88 m)     GENERAL: The patient is a well-nourished male, in no acute distress. The vital signs are documented above. CARDIAC: There is a regular rate and rhythm.  VASCULAR: Pedal pulses not palpable.  Palpable femoral pulses. PULMONARY: Non-labored respirations MUSCULOSKELETAL: There are no major deformities or cyanosis. NEUROLOGIC: No focal weakness or paresthesias are detected. SKIN: Eschar overtop of right great toe nailbed without erythema or drainage PSYCHIATRIC: The patient has a normal affect.  STUDIES:   ABIs were performed today.  The right is 0.77.  The left is 0.94.  Previously the right was 0.48.  Toe pressure is 70 on the right and 121 on the left  MEDICAL ISSUES:   Atherosclerosis with ulcer: The patient's wound on his right great toe has definitely improved, as has his pain.  I think he is making slow but steady progress.  Today's vascular lab studies show a significant increase in blood flow to the foot.  His toe  pressure is 70 which should be adequate for healing.  I  am referring the patient back to Dr. Amalia Hailey (podiatry) for follow-up.  He will see [place] back in 3 months with vascular lab studies.  If he has any decline in the wound, the next step would be to repeat his arteriogram and proceed with tibial vessel intervention.  The patient knows to contact me if he has any change in his symptoms.    Annamarie Major, MD [facility]ists of [facility] [phone] Pager [phone]

## 2017-05-20 NOTE — Addendum Note (Signed)
Addended by: Lianne Cure A on: 05/20/2017 01:39 PM   Modules accepted: Orders

## 2017-05-25 ENCOUNTER — Encounter: Payer: Self-pay | Admitting: Podiatry

## 2017-05-25 ENCOUNTER — Ambulatory Visit (INDEPENDENT_AMBULATORY_CARE_PROVIDER_SITE_OTHER): Payer: Medicare Other | Admitting: Podiatry

## 2017-05-25 DIAGNOSIS — L603 Nail dystrophy: Secondary | ICD-10-CM | POA: Diagnosis not present

## 2017-05-25 MED ORDER — MUPIROCIN 2 % EX OINT: 1.0000 "application " | TOPICAL_OINTMENT | Freq: Two times a day (BID) | CUTANEOUS | 0 refills | Status: DC

## 2017-05-26 ENCOUNTER — Other Ambulatory Visit: Payer: Self-pay | Admitting: Internal Medicine

## 2017-05-26 DIAGNOSIS — M25571 Pain in right ankle and joints of right foot: Secondary | ICD-10-CM

## 2017-05-26 DIAGNOSIS — E2749 Other adrenocortical insufficiency: Secondary | ICD-10-CM

## 2017-05-26 DIAGNOSIS — M25471 Effusion, right ankle: Secondary | ICD-10-CM

## 2017-06-03 NOTE — Progress Notes (Signed)
   Subjective:  76 year old male presents the office today for follow up evaluation of right great toe nail pain. Patient states his symptoms are improving. He has been soaking the toe every day. He states he was seen at another doctor's office last week and had the toe checked at that time. He has been soaking the toe in alcohol, Epsom salt and sea salt.   Objective/Physical Exam General: The patient is alert and oriented x3 in no acute distress.  Dermatology: Hyperkeratotic, elongated, dystrophic nails noted 12 through 5 bilateral. Skin is warm, dry and supple bilateral lower extremities. Negative for open lesions or macerations.  Vascular: Palpable pedal pulses bilaterally. No edema or erythema noted. Capillary refill within normal limits.  Neurological: Epicritic and protective threshold grossly intact bilaterally.   Musculoskeletal Exam: Significant pain on palpation noted to the nail plate of the right great toe. There appears to be purulent drainage noted underlying the nail plate. Range of motion within normal limits to all pedal and ankle joints bilateral. Muscle strength 5/5 in all groups bilateral.   Assessment: #1 right great toenail dystrophy  Plan of Care:  #1 Patient was evaluated. #2 mechanical debridement of right great toenail performed using a nail nipper without incident or bleeding. #3 prescription for mupirocin ointment given to patient. #4 return to clinic when necessary.    Edrick Kins, DPM [facility]  Dr. Edrick Kins, [address]                                        [place], [place] [address]                Office [phone]  Fax [phone]

## 2017-06-12 ENCOUNTER — Encounter: Payer: Self-pay | Admitting: Internal Medicine

## 2017-06-12 ENCOUNTER — Ambulatory Visit (INDEPENDENT_AMBULATORY_CARE_PROVIDER_SITE_OTHER): Payer: Medicare Other | Admitting: Internal Medicine

## 2017-06-12 VITALS — BP 156/76 | HR 68 | Temp 97.4°F | Wt 203.6 lb

## 2017-06-12 DIAGNOSIS — K5900 Constipation, unspecified: Secondary | ICD-10-CM

## 2017-06-12 DIAGNOSIS — R51 Headache: Secondary | ICD-10-CM | POA: Diagnosis not present

## 2017-06-12 DIAGNOSIS — I10 Essential (primary) hypertension: Secondary | ICD-10-CM | POA: Diagnosis not present

## 2017-06-12 DIAGNOSIS — I25119 Atherosclerotic heart disease of native coronary artery with unspecified angina pectoris: Secondary | ICD-10-CM | POA: Diagnosis not present

## 2017-06-12 DIAGNOSIS — Z8673 Personal history of transient ischemic attack (TIA), and cerebral infarction without residual deficits: Secondary | ICD-10-CM | POA: Diagnosis not present

## 2017-06-12 DIAGNOSIS — Z9181 History of falling: Secondary | ICD-10-CM

## 2017-06-12 DIAGNOSIS — F17210 Nicotine dependence, cigarettes, uncomplicated: Secondary | ICD-10-CM

## 2017-06-12 DIAGNOSIS — Z79899 Other long term (current) drug therapy: Secondary | ICD-10-CM | POA: Diagnosis not present

## 2017-06-12 DIAGNOSIS — M86271 Subacute osteomyelitis, right ankle and foot: Secondary | ICD-10-CM | POA: Diagnosis not present

## 2017-06-12 MED ORDER — POLYETHYLENE GLYCOL 3350 17 G PO PACK: 17.0000 g | PACK | Freq: Every day | ORAL | 0 refills | Status: DC | PRN

## 2017-06-12 MED ORDER — AMLODIPINE BESYLATE 5 MG PO TABS: 5.0000 mg | ORAL_TABLET | Freq: Every day | ORAL | 11 refills | Status: DC

## 2017-06-12 MED ORDER — AMOXICILLIN-POT CLAVULANATE 875-125 MG PO TABS: 1.0000 | ORAL_TABLET | Freq: Two times a day (BID) | ORAL | 1 refills | Status: AC

## 2017-06-12 NOTE — Progress Notes (Signed)
   CC: Follow up for right foot infection  HPI:  Derrick Hendrix is a 76 y.o. man here for follow up of his right great toe osteomyelitis.   See problem based assessment and plan below for additional details  Past Medical History:  Diagnosis Date  . Chronic kidney disease (CKD), stage III (moderate)   . Daily headache    "lately" (04/17/2016)  . Depression   . Heart murmur   . High cholesterol   . Hyperkalemia 08/2014  . Hypertension   . PAD (peripheral artery disease) ([place])   . Stroke ([place]) 03/2016   hx of PAD; j"ust lots of headaches since" (04/17/2016)    Review of Systems:  Review of Systems  Constitutional: Negative for chills and fever.  Eyes: Negative for blurred vision.  Cardiovascular: Negative for leg swelling.  Gastrointestinal: Positive for abdominal pain and constipation.  Neurological: Positive for dizziness and headaches. Negative for focal weakness and weakness.    Physical Exam: Physical Exam  Constitutional: He is well-developed, well-nourished, and in no distress.  HENT:  Mouth/Throat: Oropharynx is clear and moist. No oropharyngeal exudate.  Cardiovascular: Normal rate and regular rhythm.   Pulmonary/Chest: Effort normal and breath sounds normal.  Abdominal: Soft. He exhibits no distension. There is tenderness. There is no guarding.  Musculoskeletal: He exhibits no edema.  Right great toe is erythematous with partially intact nail, not significantly swollen, no proximal erythema  Skin: Skin is warm and dry. No rash noted.    Vitals:   06/12/17 1323 06/12/17 1332  BP: (!) 160/74 (!) 156/76  Pulse: (!) 58 68  Temp: 97.4 F (36.3 C)   TempSrc: Oral   SpO2: 100%   Weight: 203 lb 9.6 oz (92.4 kg)      Assessment & Plan:   See Encounters Tab for problem based charting.  Patient discussed with Dr. Daryll Drown

## 2017-06-12 NOTE — Patient Instructions (Signed)
It was a pleasure to see you today [name].  For your right toe infection that we saw in the hospital in March I recommend a 6 weeks long treatment with antibiotics. This long treatment is needed to get to the bone and cure the infection there.  For your constipation I recommend trying miralax 17g once every day until you get back to regular bowel movements. Then you can decrease it to as needed.  Your blood pressure is pretty high today. I would like you to start taking the amlodipine 5mg  once per day again. Let us know if you get trouble with dizziness or swelling after starting this medicine.  I would like to see you again in about 6 weeks or sooner if you need [place].

## 2017-06-15 NOTE — Assessment & Plan Note (Signed)
HPI: He is having significant constipation with associated abdominal pain, up to 7 days between bowel movements recently. He has some straining associated but without bleeding or other problems. He is not currently receiving any narcotic pain medications. He previously took miralax when experiencing constipation with good benefit but stopped after his symptoms improved.  A: Constipation Severe duration between stools but without associated symptoms  P: Miralax daily until stools are back to 1-2 day intervals then can take PRN

## 2017-06-15 NOTE — Assessment & Plan Note (Signed)
HPI: Blood pressure is uncontrolled today at 160/74. He is off all antihypertensives and on fludrocortisone for his hyporeninemic hypoaldosteronism. Previously he had problems with dizziness when tightly controlling his blood pressure ever since his previous cerebellar stroke. He is currently asymptomatic but does have intermittent headaches, which are chronic and unchanged.  A: Moderately uncontrolled hypertension. I do nt want to be overly aggressive with significant PAD, and falls in the past when low BP. However, he has CAD with angina and history of stroke so I think at least moderate control of blood pressure is needed. We could try a low dose calcium channel blocker that will not interfere with his potassium regulation as this has been troublesome in the past.  P: Start amlodipine 5mg  daily

## 2017-06-15 NOTE — Assessment & Plan Note (Signed)
HPI: Derrick Hendrix was seen in the hospital several months ago for this right toe infection with MRI demonstrating bone involvement. Antibiotic therapy was delayed at that time because amputation of the toe was originally anticipated, either after endovascular or surgical reperfusion intervention by vascular surgery to permit adequate healing. This would have been an opportunity for curative amputation or at least culture data. Since discharge he underwent stenting and superficial abscess treatment by podiatry without any specific plan for amputation at this time. He has continued inflammation and symptoms at the right great toe but does not feel distressed enough about it that he wants aggressive treatment with surgery right now.  A: Subacute osteomyelitis of the right great toe I do not know that we can get a curative response with antibiotics and I do not prefer the plan of suppressive therapy, but I think a course of prolonged oral antibiotics might be the best option at this time as symptoms are mild but persistent and the underlying problem has not been adequately treated. I do not think he is at particularly high risk of MRSA as the causative organism since this was most likely a polymicrobial, community acquired source.  P: Prescribed augmentin PO BID x6 weeks duration of treatment I recommended he contact us during the interval if severe GI side effects from the antibiotics or his foot were to worsen despite ongoing treatment

## 2017-06-15 NOTE — Progress Notes (Signed)
Internal Medicine Clinic Attending  Case discussed with Dr. Rice at the time of the visit.  We reviewed the resident's history and exam and pertinent patient test results.  I agree with the assessment, diagnosis, and plan of care documented in the resident's note.  

## 2017-06-29 ENCOUNTER — Other Ambulatory Visit: Payer: Self-pay | Admitting: Internal Medicine

## 2017-06-29 DIAGNOSIS — R339 Retention of urine, unspecified: Secondary | ICD-10-CM

## 2017-08-07 ENCOUNTER — Encounter: Payer: Medicare Other | Admitting: Internal Medicine

## 2017-08-12 ENCOUNTER — Encounter: Payer: Self-pay | Admitting: Surgery

## 2017-08-24 ENCOUNTER — Ambulatory Visit (HOSPITAL_COMMUNITY)
Admission: RE | Admit: 2017-08-24 | Discharge: 2017-08-24 | Disposition: A | Discharge status: Home / Self Care | Payer: Medicare Other | Source: Ambulatory Visit | Attending: Surgery | Admitting: Surgery

## 2017-08-24 ENCOUNTER — Ambulatory Visit (INDEPENDENT_AMBULATORY_CARE_PROVIDER_SITE_OTHER): Payer: Medicare Other | Admitting: Surgery

## 2017-08-24 ENCOUNTER — Encounter: Payer: Self-pay | Admitting: Surgery

## 2017-08-24 VITALS — BP 148/95 | HR 88 | Temp 97.1°F | Resp 16 | Ht 74.0 in | Wt 203.0 lb

## 2017-08-24 DIAGNOSIS — I70235 Atherosclerosis of native arteries of right leg with ulceration of other part of foot: Secondary | ICD-10-CM | POA: Diagnosis not present

## 2017-08-24 DIAGNOSIS — I10 Essential (primary) hypertension: Secondary | ICD-10-CM | POA: Diagnosis not present

## 2017-08-24 DIAGNOSIS — E785 Hyperlipidemia, unspecified: Secondary | ICD-10-CM | POA: Diagnosis not present

## 2017-08-24 DIAGNOSIS — R938 Abnormal findings on diagnostic imaging of other specified body structures: Secondary | ICD-10-CM | POA: Diagnosis not present

## 2017-08-24 DIAGNOSIS — Z87891 Personal history of nicotine dependence: Secondary | ICD-10-CM | POA: Insufficient documentation

## 2017-08-24 LAB — VAS US LOWER EXTREMITY ARTERIAL DUPLEX
LEFT PERO DIST SYS: 31 cm/s
Left super femoral dist sys PSV: -101 cm/s
Left super femoral mid sys PSV: -110 cm/s
Left super femoral prox sys PSV: -73 cm/s
left post tibial dist sys: 123 cm/s

## 2017-08-24 NOTE — Progress Notes (Signed)
Vascular and Vein Specialist of [place]  Patient name: Derrick Hendrix MRN: [id number] DOB: Jun 19, 1941 Sex: male   REASON FOR VISIT:    Follow up  HISOTRY OF PRESENT ILLNESS:    Derrick Hendrix is a 76 y.o. male who initially presented to the emergency department in March 2018 with worsening right great toe pain.  He has a history of nailbed removal by Dr. Amalia Hailey for localized abscess.  The patient had diminished ABIs.  He did have symptoms of claudication and nonhealing wound therefore he was taken for angiography.  This revealed a right superficial femoral artery occlusion which was subsequently stented on 03/06/2017.  He has diffuse tibial disease.  He was able to nearly healed all of his wounds.  The patient along history of smoking but has recently quit.  He had a stroke in 2017 without residual deficits.  H he takes a statin for hypercholesterolemia.  He did have a loop recorder implanted during that workup that did reveal some PACs but no atrial fibrillation.  He takes a statin for hypercholesterolemia.  He has stage III renal insufficiency.  He has multiple complaints today.  He says he hasn't felt well since he quit smoking.  He needs to have his nails trimmed, there is a boil on the bottom of his left foot   PAST MEDICAL HISTORY:   Past Medical History:  Diagnosis Date  . Chronic kidney disease (CKD), stage III (moderate)   . Daily headache    "lately" (04/17/2016)  . Depression   . Heart murmur   . High cholesterol   . Hyperkalemia 08/2014  . Hypertension   . PAD (peripheral artery disease) ([place])   . Stroke ([place]) 03/2016   hx of PAD; j"ust lots of headaches since" (04/17/2016)     FAMILY HISTORY:   Family History  Problem Relation Age of Onset  . Hypertension Mother        died age 72  . Diabetes Mother     SOCIAL HISTORY:   Social History  Substance Use Topics  . Smoking status: Former Smoker    Years: 66.00    Types:  Cigarettes    Start date: 06/23/2017  . Smokeless tobacco: Never Used  . Alcohol use No     Comment: 04/17/2016 "nothing in the last 3-4 years"     ALLERGIES:   Allergies  Allergen Reactions  . Aspirin Other (See Comments)    Nosebleeds      CURRENT MEDICATIONS:   Current Outpatient Prescriptions  Medication Sig Dispense Refill  . acetaminophen (TYLENOL) 500 MG tablet Take 2 tablets (1,000 mg total) by mouth every 6 (six) hours as needed for mild pain (or Fever >/= 101). 30 tablet 0  . amLODipine (NORVASC) 5 MG tablet Take 1 tablet (5 mg total) by mouth daily. 30 tablet 11  . atorvastatin (LIPITOR) 80 MG tablet Take 1 tablet (80 mg total) by mouth daily. 90 tablet 1  . clopidogrel (PLAVIX) 75 MG tablet TAKE 1 TABLET (75 MG TOTAL) BY MOUTH DAILY. 90 tablet 3  . ergocalciferol (VITAMIN D2) 50000 units capsule Take 50,000 Units by mouth once a week.    . finasteride (PROSCAR) 5 MG tablet Take 1 tablet (5 mg total) by mouth daily. 90 tablet 1  . fludrocortisone (FLORINEF) 0.1 MG tablet TAKE TWO TABLETS (0.2 MG TOTAL) BY MOUTH DAILY IN THE MORNING. 180 tablet 3  . furosemide (LASIX) 40 MG tablet Take 1 tablet (40 mg total) by mouth daily. [place]  tablet 3  . ipratropium (ATROVENT) 0.06 % nasal spray Place 2 sprays into the nose 3 (three) times daily. (Patient taking differently: Place 2 sprays into the nose daily as needed for rhinitis. ) 15 mL 2  . mupirocin ointment (BACTROBAN) 2 % Apply 1 application topically 2 (two) times daily. 22 g 0  . nitroGLYCERIN (NITROSTAT) 0.4 MG SL tablet Place 1 tablet (0.4 mg total) under the tongue every 5 (five) minutes x 3 doses as needed for chest pain. 30 tablet 3  . polyethylene glycol (MIRALAX) packet Take 17 g by mouth daily as needed (constipation). 14 each 0  . tamsulosin (FLOMAX) 0.4 MG CAPS capsule TAKE 1 CAPSULE (0.4 MG TOTAL) BY MOUTH DAILY AFTER SUPPER. 90 capsule 3   No current facility-administered medications for this visit.     REVIEW OF  SYSTEMS:   [X]  denotes positive finding, [ ]  denotes negative finding Cardiac  Comments:  Chest pain or chest pressure:    Shortness of breath upon exertion:    Short of breath when lying flat:    Irregular heart rhythm:        Vascular    Pain in calf, thigh, or hip brought on by ambulation:    Pain in feet at night that wakes you up from your sleep:     Blood clot in your veins:    Leg swelling:         Pulmonary    Oxygen at home:    Productive cough:     Wheezing:         Neurologic    Sudden weakness in arms or legs:     Sudden numbness in arms or legs:     Sudden onset of difficulty speaking or slurred speech:    Temporary loss of vision in one eye:     Problems with dizziness:         Gastrointestinal    Blood in stool:     Vomited blood:         Genitourinary    Burning when urinating:     Blood in urine:        Psychiatric    Major depression:         Hematologic    Bleeding problems:    Problems with blood clotting too easily:        Skin    Rashes or ulcers:        Constitutional    Fever or chills:      PHYSICAL EXAM:   Vitals:   08/24/17 1117  BP: (!) 148/95  Pulse: 88  Resp: 16  Temp: (!) 97.1 F (36.2 C)  TempSrc: Oral  SpO2: 96%  Weight: 203 lb (92.1 kg)  Height: 6\' 2"  (1.88 m)    GENERAL: The patient is a well-nourished male, in no acute distress. The vital signs are documented above. CARDIAC: There is a regular rate and rhythm.  VASCULAR: I cannot palpate pedal pulses PULMONARY: Non-labored respirations MUSCULOSKELETAL: There are no major deformities or cyanosis. NEUROLOGIC: No focal weakness or paresthesias are detected. SKIN: No open wounds PSYCHIATRIC: The patient has a normal affect.  STUDIES:   Ultrasound was ordered and reviewed today.  ABI is 0.68 on the right and 0.91 on the left.  Previously it was 0.77 on the right and 0.94 left.  The stent appears to be widely patent with triphasic waveforms down to the tibial  vessels  MEDICAL ISSUES:   Status post stenting for  a right toe wound which has nearly healed.  His stents appear to be widely patent.  He has known severe tibial disease/occlusion.  I am scheduling him to follow-up with Dr. Amalia Hailey for nail trimming as well as reevaluation of his right nail bed which was previously removed for abscess.  The patient will continue with routine surveillance.  I haven't scheduled follow-up in 6 months.  He will continue dual antiplatelet therapy as long as he can.    Annamarie Major, MD [facility] of [facility] [phone] Pager [phone]

## 2017-08-26 ENCOUNTER — Telehealth: Payer: Self-pay | Admitting: *Deleted

## 2017-08-26 NOTE — Telephone Encounter (Signed)
Patient called complaining of severe legs/feet pain. Unable to stand or walk, has been voiding every 30 mins since this morning but has been having this frequency lately. Asked about meds he's taking( if he's taking lasix & flomax) but couldn't tell me. Has not slept since Monday due to these issues. He said there is also a knot on his foot. He's feeling weak.  Would like to be seen but [facility] is full today.  I advised to go to ER today to be evaluated. Verbalized understanding.

## 2017-08-27 ENCOUNTER — Encounter: Payer: Self-pay | Admitting: Internal Medicine

## 2017-08-27 ENCOUNTER — Ambulatory Visit: Payer: Medicare Other

## 2017-08-28 ENCOUNTER — Encounter (HOSPITAL_COMMUNITY): Payer: Self-pay | Admitting: *Deleted

## 2017-08-28 ENCOUNTER — Inpatient Hospital Stay (HOSPITAL_COMMUNITY): Payer: Medicare Other

## 2017-08-28 ENCOUNTER — Emergency Department (HOSPITAL_COMMUNITY): Admit: 2017-08-28 | Discharge: 2017-08-28 | Disposition: A | Discharge status: Home / Self Care | Payer: Medicare Other

## 2017-08-28 ENCOUNTER — Ambulatory Visit: Payer: Medicare Other

## 2017-08-28 ENCOUNTER — Inpatient Hospital Stay (HOSPITAL_COMMUNITY)
Admission: EM | Admit: 2017-08-28 | Discharge: 2017-08-30 | DRG: 638 | Disposition: A | Discharge status: Home / Self Care | Payer: Medicare Other | Attending: Internal Medicine | Admitting: Internal Medicine

## 2017-08-28 DIAGNOSIS — E2749 Other adrenocortical insufficiency: Secondary | ICD-10-CM | POA: Diagnosis present

## 2017-08-28 DIAGNOSIS — Z79899 Other long term (current) drug therapy: Secondary | ICD-10-CM

## 2017-08-28 DIAGNOSIS — E1122 Type 2 diabetes mellitus with diabetic chronic kidney disease: Secondary | ICD-10-CM | POA: Diagnosis not present

## 2017-08-28 DIAGNOSIS — L02612 Cutaneous abscess of left foot: Secondary | ICD-10-CM | POA: Diagnosis present

## 2017-08-28 DIAGNOSIS — E1165 Type 2 diabetes mellitus with hyperglycemia: Secondary | ICD-10-CM | POA: Diagnosis not present

## 2017-08-28 DIAGNOSIS — L03116 Cellulitis of left lower limb: Secondary | ICD-10-CM | POA: Diagnosis not present

## 2017-08-28 DIAGNOSIS — M79672 Pain in left foot: Secondary | ICD-10-CM | POA: Diagnosis not present

## 2017-08-28 DIAGNOSIS — I129 Hypertensive chronic kidney disease with stage 1 through stage 4 chronic kidney disease, or unspecified chronic kidney disease: Secondary | ICD-10-CM | POA: Diagnosis present

## 2017-08-28 DIAGNOSIS — B9689 Other specified bacterial agents as the cause of diseases classified elsewhere: Secondary | ICD-10-CM | POA: Diagnosis present

## 2017-08-28 DIAGNOSIS — M79609 Pain in unspecified limb: Secondary | ICD-10-CM | POA: Diagnosis not present

## 2017-08-28 DIAGNOSIS — E11628 Type 2 diabetes mellitus with other skin complications: Principal | ICD-10-CM | POA: Diagnosis present

## 2017-08-28 DIAGNOSIS — E7800 Pure hypercholesterolemia, unspecified: Secondary | ICD-10-CM | POA: Diagnosis present

## 2017-08-28 DIAGNOSIS — Z8673 Personal history of transient ischemic attack (TIA), and cerebral infarction without residual deficits: Secondary | ICD-10-CM | POA: Diagnosis not present

## 2017-08-28 DIAGNOSIS — Z87891 Personal history of nicotine dependence: Secondary | ICD-10-CM | POA: Diagnosis not present

## 2017-08-28 DIAGNOSIS — N184 Chronic kidney disease, stage 4 (severe): Secondary | ICD-10-CM | POA: Diagnosis present

## 2017-08-28 DIAGNOSIS — N183 Chronic kidney disease, stage 3 (moderate): Secondary | ICD-10-CM | POA: Diagnosis not present

## 2017-08-28 DIAGNOSIS — E875 Hyperkalemia: Secondary | ICD-10-CM | POA: Diagnosis not present

## 2017-08-28 DIAGNOSIS — Z8739 Personal history of other diseases of the musculoskeletal system and connective tissue: Secondary | ICD-10-CM | POA: Diagnosis not present

## 2017-08-28 DIAGNOSIS — Z7902 Long term (current) use of antithrombotics/antiplatelets: Secondary | ICD-10-CM | POA: Diagnosis not present

## 2017-08-28 DIAGNOSIS — E11621 Type 2 diabetes mellitus with foot ulcer: Secondary | ICD-10-CM | POA: Diagnosis not present

## 2017-08-28 DIAGNOSIS — N179 Acute kidney failure, unspecified: Secondary | ICD-10-CM | POA: Diagnosis not present

## 2017-08-28 DIAGNOSIS — F329 Major depressive disorder, single episode, unspecified: Secondary | ICD-10-CM | POA: Diagnosis not present

## 2017-08-28 DIAGNOSIS — K5900 Constipation, unspecified: Secondary | ICD-10-CM | POA: Diagnosis not present

## 2017-08-28 DIAGNOSIS — N185 Chronic kidney disease, stage 5: Secondary | ICD-10-CM | POA: Diagnosis present

## 2017-08-28 DIAGNOSIS — E1151 Type 2 diabetes mellitus with diabetic peripheral angiopathy without gangrene: Secondary | ICD-10-CM | POA: Diagnosis present

## 2017-08-28 DIAGNOSIS — L97429 Non-pressure chronic ulcer of left heel and midfoot with unspecified severity: Secondary | ICD-10-CM | POA: Diagnosis not present

## 2017-08-28 DIAGNOSIS — R739 Hyperglycemia, unspecified: Secondary | ICD-10-CM

## 2017-08-28 DIAGNOSIS — Z886 Allergy status to analgesic agent status: Secondary | ICD-10-CM

## 2017-08-28 HISTORY — DX: Cutaneous abscess of left foot: L02.612

## 2017-08-28 LAB — CBC WITH DIFFERENTIAL/PLATELET
Basophils Absolute: 0 10*3/uL (ref 0.0–0.1)
Basophils Relative: 0 %
Eosinophils Absolute: 0.2 10*3/uL (ref 0.0–0.7)
Eosinophils Relative: 1 %
HCT: 41.7 % (ref 39.0–52.0)
Hemoglobin: 13.4 g/dL (ref 13.0–17.0)
Lymphocytes Relative: 13 %
Lymphs Abs: 1.5 10*3/uL (ref 0.7–4.0)
MCH: 28.6 pg (ref 26.0–34.0)
MCHC: 32.1 g/dL (ref 30.0–36.0)
MCV: 88.9 fL (ref 78.0–100.0)
Monocytes Absolute: 0.8 10*3/uL (ref 0.1–1.0)
Monocytes Relative: 7 %
Neutro Abs: 9.2 10*3/uL — ABNORMAL HIGH (ref 1.7–7.7)
Neutrophils Relative %: 79 %
Platelets: 266 10*3/uL (ref 150–400)
RBC: 4.69 MIL/uL (ref 4.22–5.81)
RDW: 14.9 % (ref 11.5–15.5)
WBC: 11.7 10*3/uL — ABNORMAL HIGH (ref 4.0–10.5)

## 2017-08-28 LAB — CREATININE, SERUM
CREATININE: 2.02 mg/dL — AB (ref 0.61–1.24)
GFR, EST AFRICAN AMERICAN: 35 mL/min — AB (ref >60)
GFR, EST NON AFRICAN AMERICAN: 30 mL/min — AB (ref >60)

## 2017-08-28 LAB — BASIC METABOLIC PANEL
Anion gap: 12 (ref 5–15)
BUN: 43 mg/dL — ABNORMAL HIGH (ref 6–20)
CO2: 22 mmol/L (ref 22–32)
Calcium: 8.9 mg/dL (ref 8.9–10.3)
Chloride: 96 mmol/L — ABNORMAL LOW (ref 101–111)
Creatinine, Ser: 2.46 mg/dL — ABNORMAL HIGH (ref 0.61–1.24)
GFR calc Af Amer: 27 mL/min — ABNORMAL LOW (ref >60)
GFR calc non Af Amer: 24 mL/min — ABNORMAL LOW (ref >60)
Glucose, Bld: 517 mg/dL (ref 65–99)
Potassium: 4.1 mmol/L (ref 3.5–5.1)
Sodium: 130 mmol/L — ABNORMAL LOW (ref 135–145)

## 2017-08-28 LAB — CBC
HCT: 37.4 % — ABNORMAL LOW (ref 39.0–52.0)
Hemoglobin: 12.8 g/dL — ABNORMAL LOW (ref 13.0–17.0)
MCH: 30.3 pg (ref 26.0–34.0)
MCHC: 34.2 g/dL (ref 30.0–36.0)
MCV: 88.6 fL (ref 78.0–100.0)
PLATELETS: 276 10*3/uL (ref 150–400)
RBC: 4.22 MIL/uL (ref 4.22–5.81)
RDW: 14.6 % (ref 11.5–15.5)
WBC: 12.4 10*3/uL — AB (ref 4.0–10.5)

## 2017-08-28 LAB — CBG MONITORING, ED
GLUCOSE-CAPILLARY: 373 mg/dL — AB (ref 65–99)
GLUCOSE-CAPILLARY: 232 mg/dL — AB (ref 65–99)
Glucose-Capillary: 300 mg/dL — ABNORMAL HIGH (ref 65–99)

## 2017-08-28 LAB — HEMOGLOBIN A1C
HEMOGLOBIN A1C: 11.1 % — AB (ref 4.8–5.6)
Mean Plasma Glucose: 271.87 mg/dL

## 2017-08-28 LAB — GLUCOSE, CAPILLARY: GLUCOSE-CAPILLARY: 141 mg/dL — AB (ref 65–99)

## 2017-08-28 MED ORDER — HYDROCODONE-ACETAMINOPHEN 5-325 MG PO TABS: 1.0000 | ORAL_TABLET | Freq: Four times a day (QID) | ORAL | Status: DC | PRN

## 2017-08-28 MED ORDER — MORPHINE SULFATE (PF) 4 MG/ML IV SOLN
4.0000 mg | Freq: Once | INTRAVENOUS | Status: AC
  Administered 2017-08-28: 4 mg via INTRAMUSCULAR

## 2017-08-28 MED ORDER — SODIUM CHLORIDE 0.9 % IV SOLN
INTRAVENOUS | Status: DC
  Administered 2017-08-28: 125 mL/h via INTRAVENOUS

## 2017-08-28 MED ORDER — LIDOCAINE HCL (PF) 1 % IJ SOLN
5.0000 mL | Freq: Once | INTRAMUSCULAR | Status: AC
  Administered 2017-08-28: 5 mL
  Filled 2017-08-28: qty 5

## 2017-08-28 MED ORDER — AMLODIPINE BESYLATE 5 MG PO TABS
5.0000 mg | ORAL_TABLET | Freq: Every day | ORAL | Status: DC
  Administered 2017-08-29 – 2017-08-30 (×2): 5 mg via ORAL
  Filled 2017-08-28 (×2): qty 1

## 2017-08-28 MED ORDER — SODIUM CHLORIDE 0.9 % IV SOLN
INTRAVENOUS | Status: DC
  Administered 2017-08-28: 3.1 [IU]/h via INTRAVENOUS
  Filled 2017-08-28: qty 1

## 2017-08-28 MED ORDER — MORPHINE SULFATE (PF) 4 MG/ML IV SOLN
4.0000 mg | Freq: Once | INTRAVENOUS | Status: DC
  Filled 2017-08-28: qty 1

## 2017-08-28 MED ORDER — SODIUM CHLORIDE 0.9 % IV SOLN
1.5000 g | Freq: Two times a day (BID) | INTRAVENOUS | Status: DC
  Filled 2017-08-28 (×2): qty 1.5

## 2017-08-28 MED ORDER — ACETAMINOPHEN 325 MG PO TABS: 325.0000 mg | ORAL_TABLET | Freq: Four times a day (QID) | ORAL | Status: DC | PRN

## 2017-08-28 MED ORDER — SODIUM CHLORIDE 0.9 % IV BOLUS (SEPSIS)
1000.0000 mL | Freq: Once | INTRAVENOUS | Status: AC
  Administered 2017-08-28: 1000 mL via INTRAVENOUS

## 2017-08-28 MED ORDER — SODIUM CHLORIDE 0.9 % IV SOLN
3.0000 g | Freq: Two times a day (BID) | INTRAVENOUS | Status: DC
  Administered 2017-08-29 – 2017-08-30 (×3): 3 g via INTRAVENOUS
  Filled 2017-08-28 (×5): qty 3

## 2017-08-28 MED ORDER — DEXTROSE-NACL 5-0.45 % IV SOLN: INTRAVENOUS | Status: DC

## 2017-08-28 MED ORDER — FINASTERIDE 5 MG PO TABS
5.0000 mg | ORAL_TABLET | Freq: Every day | ORAL | Status: DC
  Administered 2017-08-29 – 2017-08-30 (×2): 5 mg via ORAL
  Filled 2017-08-28 (×2): qty 1

## 2017-08-28 MED ORDER — ACETAMINOPHEN 650 MG RE SUPP: 650.0000 mg | Freq: Four times a day (QID) | RECTAL | Status: DC | PRN

## 2017-08-28 MED ORDER — CEFAZOLIN SODIUM-DEXTROSE 1-4 GM/50ML-% IV SOLN
1.0000 g | Freq: Once | INTRAVENOUS | Status: AC
  Administered 2017-08-28: 1 g via INTRAVENOUS
  Filled 2017-08-28: qty 50

## 2017-08-28 MED ORDER — ACETAMINOPHEN 325 MG PO TABS: 650.0000 mg | ORAL_TABLET | Freq: Four times a day (QID) | ORAL | Status: DC | PRN

## 2017-08-28 MED ORDER — ATORVASTATIN CALCIUM 80 MG PO TABS
80.0000 mg | ORAL_TABLET | Freq: Every day | ORAL | Status: DC
  Administered 2017-08-28 – 2017-08-29 (×2): 80 mg via ORAL
  Filled 2017-08-28 (×2): qty 1

## 2017-08-28 MED ORDER — IPRATROPIUM BROMIDE 0.06 % NA SOLN: 2.0000 | Freq: Three times a day (TID) | NASAL | Status: DC

## 2017-08-28 MED ORDER — CLOPIDOGREL BISULFATE 75 MG PO TABS
75.0000 mg | ORAL_TABLET | Freq: Every day | ORAL | Status: DC
  Administered 2017-08-29 – 2017-08-30 (×2): 75 mg via ORAL
  Filled 2017-08-28 (×2): qty 1

## 2017-08-28 MED ORDER — HEPARIN SODIUM (PORCINE) 5000 UNIT/ML IJ SOLN
5000.0000 [IU] | Freq: Three times a day (TID) | INTRAMUSCULAR | Status: DC
  Administered 2017-08-28 – 2017-08-30 (×5): 5000 [IU] via SUBCUTANEOUS
  Filled 2017-08-28 (×5): qty 1

## 2017-08-28 MED ORDER — SENNOSIDES-DOCUSATE SODIUM 8.6-50 MG PO TABS: 1.0000 | ORAL_TABLET | Freq: Every evening | ORAL | Status: DC | PRN

## 2017-08-28 MED ORDER — INSULIN ASPART 100 UNIT/ML ~~LOC~~ SOLN
0.0000 [IU] | Freq: Three times a day (TID) | SUBCUTANEOUS | Status: DC
  Administered 2017-08-29: 3 [IU] via SUBCUTANEOUS
  Administered 2017-08-29: 2 [IU] via SUBCUTANEOUS
  Administered 2017-08-29: 3 [IU] via SUBCUTANEOUS
  Administered 2017-08-30: 1 [IU] via SUBCUTANEOUS

## 2017-08-28 MED ORDER — FLUDROCORTISONE ACETATE 0.1 MG PO TABS
0.1000 mg | ORAL_TABLET | Freq: Every day | ORAL | Status: DC
  Administered 2017-08-29 – 2017-08-30 (×2): 0.1 mg via ORAL
  Filled 2017-08-28 (×3): qty 1

## 2017-08-28 MED ORDER — TAMSULOSIN HCL 0.4 MG PO CAPS
0.4000 mg | ORAL_CAPSULE | Freq: Every day | ORAL | Status: DC
  Administered 2017-08-28 – 2017-08-29 (×2): 0.4 mg via ORAL
  Filled 2017-08-28 (×2): qty 1

## 2017-08-28 NOTE — ED Triage Notes (Addendum)
Pt reports left foot pain and swelling x 5 days. Denies injury. Difficulty sleeping and ambulating due to pain.

## 2017-08-28 NOTE — ED Notes (Addendum)
Patient on way to Vascular.

## 2017-08-28 NOTE — ED Notes (Signed)
Two unsuccessful IV attempts.

## 2017-08-28 NOTE — ED Provider Notes (Signed)
[place] DEPT Provider Note   CSN: [id number] Arrival date & time: 08/28/17  1043     History   Chief Complaint Chief Complaint  Patient presents with  . Foot Pain    HPI Derrick Hendrix is a 76 y.o. male with history of hypertension, chronic kidney disease, peripheral artery disease who presents with left foot pain, left calf pain, and left foot swelling for the past 5 days. He has had difficulty sleeping and walking due to pain. He is not taking any medications at home for symptoms. He denies any known injury. He does note that he is not very active and sits a lot. He does not have any history of blood clots. He denies chest pain or shortness of breath. He also denies any current abdominal pain, nausea, vomiting, or urinary symptoms.  HPI  Past Medical History:  Diagnosis Date  . Chronic kidney disease (CKD), stage III (moderate)   . Daily headache    "lately" (04/17/2016)  . Depression   . Heart murmur   . High cholesterol   . Hyperkalemia 08/2014  . Hypertension   . PAD (peripheral artery disease) ([place])   . Stroke [facility]) 03/2016   hx of PAD; j"ust lots of headaches since" (04/17/2016)    Patient Active Problem List   Diagnosis Date Noted  . Preop cardiovascular exam   . Osteomyelitis ([place])   . Ischemic pain of foot, right   . Paronychia of great toe, right   . Severe peripheral arterial disease ([place]) 02/27/2017  . Right foot pain 02/09/2017  . Hyperlipemia 11/11/2016  . Cerebellar infarct ([place]) 05/06/2016  . Benign prostatic hyperplasia with urinary obstruction   . Orthostatic hypotension 04/04/2016  . Urinary retention 03/20/2016  . Cerebrovascular accident (CVA) due to occlusion of left posterior cerebral artery ([place])   . Dizziness 03/07/2016  . Neuropathic pain 02/06/2015  . Constipation 11/07/2014  . Essential hypertension   . Hyperkalemia 10/20/2014  . Normocytic anemia 10/20/2014  . Hyporeninemic hypoaldosteronism ([place]) 08/24/2014  . Healthcare  maintenance 07/13/2014  . Atherosclerosis of leg with intermittent claudication, R 06/29/2014  . Weight loss, unintentional 06/27/2014  . Current smoker 06/27/2014  . Alcohol use ([place]) 06/27/2014  . Stable angina ([place]) 06/27/2014  . Vitamin B12 deficiency 06/27/2014  . CKD (chronic kidney disease), stage III 06/26/2014  . Headache 06/26/2014    Past Surgical History:  Procedure Laterality Date  . ABDOMINAL AORTOGRAM W/LOWER EXTREMITY N/A 03/02/2017   Procedure: Abdominal Aortogram w/Lower Extremity;  Surgeon: Conrad Brooks, MD;  Location: [place] CV LAB;  Service: Cardiovascular;  Laterality: N/A;  . EP IMPLANTABLE DEVICE N/A 03/04/2016   Procedure: Loop Recorder Insertion;  Surgeon: Thompson Grayer, MD;  Location: [place] CV LAB;  Service: Cardiovascular;  Laterality: N/A;  . PERIPHERAL VASCULAR INTERVENTION Right 03/06/2017   Procedure: Peripheral Vascular Intervention;  Surgeon: Elam Dutch, MD;  Location: [place] [facility];  Service: Cardiovascular;  Laterality: Right;  SFA       Home Medications    Prior to Admission medications   Medication Sig Start Date End Date Taking? Authorizing Provider  amLODipine (NORVASC) 5 MG tablet Take 1 tablet (5 mg total) by mouth daily. 06/12/17 06/12/18 Yes Rice, Resa Miner, MD  atorvastatin (LIPITOR) 80 MG tablet Take 1 tablet (80 mg total) by mouth daily. 11/10/16  Yes Rice, Resa Miner, MD  clopidogrel (PLAVIX) 75 MG tablet TAKE 1 TABLET (75 MG TOTAL) BY MOUTH DAILY. 07/02/17  Yes Rice, Resa Miner, MD  finasteride (PROSCAR) 5 MG tablet Take 1 tablet (5 mg total) by mouth daily. 11/10/16  Yes Rice, Resa Miner, MD  fludrocortisone (FLORINEF) 0.1 MG tablet TAKE TWO TABLETS (0.2 MG TOTAL) BY MOUTH DAILY IN THE MORNING. 07/02/17  Yes Rice, Resa Miner, MD  furosemide (LASIX) 40 MG tablet Take 1 tablet (40 mg total) by mouth daily. 05/27/17 05/27/18 Yes Rice, Resa Miner, MD  nitroGLYCERIN (NITROSTAT) 0.4 MG SL tablet Place 1  tablet (0.4 mg total) under the tongue every 5 (five) minutes x 3 doses as needed for chest pain. 11/29/14  Yes Luan Moore, MD  tamsulosin (FLOMAX) 0.4 MG CAPS capsule TAKE 1 CAPSULE (0.4 MG TOTAL) BY MOUTH DAILY AFTER SUPPER. 07/02/17  Yes Rice, Resa Miner, MD  acetaminophen (TYLENOL) 500 MG tablet Take 2 tablets (1,000 mg total) by mouth every 6 (six) hours as needed for mild pain (or Fever >/= 101). 04/05/16   [place]ole[place] Chance, MD  ergocalciferol (VITAMIN D2) 50000 units capsule Take 50,000 Units by mouth once a week.    [provider]  ipratropium (ATROVENT) 0.06 % nasal spray Place 2 sprays into the nose 3 (three) times daily. Patient taking differently: Place 2 sprays into the nose daily as needed for rhinitis.  02/06/15   Luan Moore, MD  mupirocin ointment (BACTROBAN) 2 % Apply 1 application topically 2 (two) times daily. 05/25/17   Edrick Kins, DPM  polyethylene glycol [facility]) packet Take 17 g by mouth daily as needed (constipation). 06/12/17   [name], MD    Family History Family History  Problem Relation Age of Onset  . Hypertension Mother        died age 80  . Diabetes Mother     Social History Social History  Substance Use Topics  . Smoking status: Former Smoker    Years: 66.00    Types: Cigarettes    Start date: 06/23/2017  . Smokeless tobacco: Never Used  . Alcohol use No     Comment: 04/17/2016 "nothing in the last 3-4 years"     Allergies   Aspirin   Review of Systems Review of Systems  Constitutional: Negative for chills and fever.  HENT: Negative for facial swelling and sore throat.   Respiratory: Negative for shortness of breath.   Cardiovascular: Negative for chest pain.  Gastrointestinal: Negative for abdominal pain, nausea and vomiting.  Genitourinary: Negative for dysuria.  Musculoskeletal: Positive for arthralgias and joint swelling. Negative for back pain.  Skin: Negative for rash and wound.  Neurological: Negative for  headaches.  Psychiatric/Behavioral: The patient is not nervous/anxious.      Physical Exam Updated Vital Signs BP (!) 168/89   Pulse 83   Temp (!) 97.5 F (36.4 C) (Oral)   Resp 18   SpO2 93%   Physical Exam  Constitutional: He appears well-developed and well-nourished. No distress.  HENT:  Head: Normocephalic and atraumatic.  Mouth/Throat: Oropharynx is clear and moist. No oropharyngeal exudate.  Eyes: Pupils are equal, round, and reactive to light. Conjunctivae are normal. Right eye exhibits no discharge. Left eye exhibits no discharge. No scleral icterus.  Neck: Normal range of motion. Neck supple. No thyromegaly present.  Cardiovascular: Normal rate, regular rhythm, normal heart sounds and intact distal pulses.  Exam reveals no gallop and no friction rub.   No murmur heard. Pulmonary/Chest: Effort normal and breath sounds normal. No stridor. No respiratory distress. He has no wheezes. He has no rales.  Abdominal: Soft. Bowel sounds are normal. He exhibits  no distension. There is no tenderness. There is no rebound and no guarding.  Musculoskeletal: He exhibits no edema.  Edema and tenderness to L foot, especially over area of fluctuance over heel; tenderness leading up to poasterior calf and knee; associated edema to L ankle; DP pulses intactl sensation intact; skin changes (a lot of flaking to L sole of foot)  Lymphadenopathy:    He has no cervical adenopathy.  Neurological: He is alert. Coordination normal.  Skin: Skin is warm and dry. No rash noted. He is not diaphoretic. No pallor.  Psychiatric: He has a normal mood and affect.  Nursing note and vitals reviewed.      ED Treatments / Results  Labs (all labs ordered are listed, but only abnormal results are displayed) Labs Reviewed  BASIC METABOLIC PANEL - Abnormal; Notable for the following:       Result Value   Sodium 130 (*)    Chloride 96 (*)    Glucose, Bld 517 (*)    BUN 43 (*)    Creatinine, Ser 2.46 (*)      GFR calc non Af Amer 24 (*)    GFR calc Af Amer 27 (*)    All other components within normal limits  CBC WITH DIFFERENTIAL/PLATELET - Abnormal; Notable for the following:    WBC 11.7 (*)    Neutro Abs 9.2 (*)    All other components within normal limits  CBG MONITORING, ED - Abnormal; Notable for the following:    Glucose-Capillary 373 (*)    All other components within normal limits  AEROBIC CULTURE (SUPERFICIAL SPECIMEN)    EKG  EKG Interpretation None       Radiology No results found.  Procedures Procedures (including critical care time)  INCISION AND DRAINAGE Performed by: Frederica Kuster Consent: Verbal consent obtained. Risks and benefits: risks, benefits and alternatives were discussed Type: abscess  Body area: L heel  Anesthesia: local infiltration  Incision was made with a scalpel.  Local anesthetic: lidocaine 1% w/o epinephrine  Anesthetic total: 5 ml  Complexity: complex Blunt dissection to break up loculations not attempted due to patient's intolerance of palpating for drainage  Drainage: purulent, bloody, foul odor  Drainage amount: copious  Patient tolerance: Patient tolerated the procedure well with significant pain and moving with no immediate complications.   Medications Ordered in ED Medications  dextrose 5 %-0.45 % sodium chloride infusion (not administered)  insulin regular (NOVOLIN R,HUMULIN R) 100 Units in sodium chloride 0.9 % 100 mL (1 Units/mL) infusion (3.1 Units/hr Intravenous New Bag/Given 08/28/17 1653)  0.9 %  sodium chloride infusion (125 mL/hr Intravenous New Bag/Given 08/28/17 1653)  morphine 4 MG/ML injection 4 mg (4 mg Intramuscular Given 08/28/17 1240)  sodium chloride 0.9 % bolus 1,000 mL (0 mLs Intravenous Stopped 08/28/17 1640)  lidocaine (PF) (XYLOCAINE) 1 % injection 5 mL (5 mLs Infiltration Given 08/28/17 1609)  ceFAZolin (ANCEF) IVPB 1 g/50 mL premix (0 g Intravenous Stopped 08/28/17 1640)     Initial Impression  / Assessment and Plan / ED Course  I have reviewed the triage vital signs and the nursing notes.  Pertinent labs & imaging results that were available during my care of the patient were reviewed by me and considered in my medical decision making (see chart for details).     Patient with abscess to left heel, incision and drainage performed in the ED. Suspect surrounding cellulitis. Copious purulent, bloody discharge. CBC shows WBC 11.7. Patient with AKI, BUN 43, creatinine  2.46. Glucose is 517. Patient has no documented history of diabetes. Patient initiated on fluids and glucose stabilizing her in the ED. Ancef also initiated. I spoke with Dr. Virgina Norfolk with the resident internal medicine teaching service who opened the patient for further evaluation and treatment. Patient also evaluated by Dr. Tamera Punt who guided the patient's management and agrees with plan.  Final Clinical Impressions(s) / ED Diagnoses   Final diagnoses:  Abscess of left foot  AKI (acute kidney injury) ([place])  Hyperglycemia  Cellulitis of left lower extremity    New Prescriptions New Prescriptions   No medications on file     Frederica Kuster, Hershal Coria 08/28/17 1730    Malvin Johns, MD 08/29/17 1409

## 2017-08-28 NOTE — H&P (Signed)
Date: 08/28/2017               Patient Name:  Derrick Hendrix MRN: [id number]  DOB: [birth year] Age / Sex: 76 y.o., male   PCP: [name], MD         Medical Service: Internal Medicine Teaching Service         Attending Physician: Dr. Lucious Groves, DO    First Contact: Dr. Berline Lopes Pager: [phone]  Second Contact: Dr. Reesa Chew Pager: [phone]       After Hours (After 5p/  First Contact Pager: [phone]  weekends / holidays): Second Contact Pager: [phone]   Chief Complaint: "Left foot pain"  History of Present Illness: Derrick Hendrix 76 year old gentleman who presented with left sided heel pain. He has a past medical history significant for CAD stage III, hypertension, PAD, previous stroke in 2017 and depression. He stated that this past Monday when he went to visit his podiatrist for his right great toe pain, it was observed that he had a lesion of the right heel area. Since that time he's had extreme pain in the area and has been unable to sleep. Patient states that he attempted to soak the foot in Epsom salts, saline solution, baking soda solution, as well as other multiple solutions to no avail. In addition the patient has tried no other medications, and states that the pain is progressively worsening.  He denied headache, vision changes, chest pain, abdominal pain, leg pain, fever, chills, nausea, vomiting, diarrhea. Patient attested to chronic cough, muscle cramps in his abdomen when he attempts to reach his toes. Patient endorses constipation.  In the ED incision and drainage was attempted of the abscess resulting in purulent discharge. BMP indicated a glucose of 517, sodium of 130, creatinine at 2.46 which is approximate 1 point above his baseline and BUN 43. CBC was not performed in the ED. X-ray of the foot was ordered but unable to be performed given complications with radiology at this time. IMTS was called for admission.   Meds:  Current Meds  Medication Sig  .  amLODipine (NORVASC) 5 MG tablet Take 1 tablet (5 mg total) by mouth daily.  [name] atorvastatin (LIPITOR) 80 MG tablet Take 1 tablet (80 mg total) by mouth daily.  . clopidogrel (PLAVIX) 75 MG tablet TAKE 1 TABLET (75 MG TOTAL) BY MOUTH DAILY.  . finasteride (PROSCAR) 5 MG tablet Take 1 tablet (5 mg total) by mouth daily.  . fludrocortisone (FLORINEF) 0.1 MG tablet TAKE TWO TABLETS (0.2 MG TOTAL) BY MOUTH DAILY IN THE MORNING.  . furosemide (LASIX) 40 MG tablet Take 1 tablet (40 mg total) by mouth daily.  . nitroGLYCERIN (NITROSTAT) 0.4 MG SL tablet Place 1 tablet (0.4 mg total) under the tongue every 5 (five) minutes x 3 doses as needed for chest pain.  . tamsulosin (FLOMAX) 0.4 MG CAPS capsule TAKE 1 CAPSULE (0.4 MG TOTAL) BY MOUTH DAILY AFTER SUPPER.    Allergies: Allergies as of 08/28/2017 - Review Complete 08/28/2017  Allergen Reaction Noted  . Aspirin Other (See Comments) 08/24/2014   Past Medical History:  Diagnosis Date  . Chronic kidney disease (CKD), stage III (moderate)   . Daily headache    "lately" (04/17/2016)  . Depression   . Heart murmur   . High cholesterol   . Hyperkalemia 08/2014  . Hypertension   . PAD (peripheral artery disease) ([place])   . Stroke ([place]) 03/2016   hx of PAD; j"ust lots of  headaches since" (04/17/2016)    Family History:  Family History  Problem Relation Age of Onset  . Hypertension Mother        died age 51  . Diabetes Mother    Social History:  Social History   Social History  . Marital status: Single    Spouse name: N/A  . Number of children: N/A  . Years of education: N/A   Occupational History  . Not on file.   Social History Main Topics  . Smoking status: Former Smoker    Years: 66.00    Types: Cigarettes    Start date: 06/23/2017  . Smokeless tobacco: Never Used  . Alcohol use No     Comment: 04/17/2016 "nothing in the last 3-4 years"  . Drug use: No  . Sexual activity: No   Other Topics Concern  . Not on file   Social  History Narrative   Works in the yard         Review of Systems: A complete ROS was negative except as per HPI.   Physical Exam: Blood pressure (!) 157/86, pulse 90, temperature 97.8 F (36.6 C), temperature source Oral, resp. rate 18, SpO2 97 %. Physical Exam  Constitutional: He appears well-developed and well-nourished. No distress.  HENT:  Head: Normocephalic and atraumatic.  Eyes: Conjunctivae and EOM are normal.  Neck: Normal range of motion.  Cardiovascular: Normal rate and regular rhythm.   No murmur heard. Pulmonary/Chest: Effort normal and breath sounds normal. No respiratory distress.  Abdominal: Soft. Bowel sounds are normal. He exhibits distension. There is no tenderness.  Musculoskeletal: He exhibits tenderness (To the site of his wound of the left heel ). He exhibits no deformity.  Neurological: He is alert.  Skin: Skin is warm. He is not diaphoretic.  Psychiatric: He has a normal mood and affect.       EKG: personally reviewed my interpretation is n/a  CXR: personally reviewed my interpretation is n/a  Assessment & Plan by Problem: Active Problems:   CKD (chronic kidney disease), stage III   Hyporeninemic hypoaldosteronism (H[facility])   Hyperkalemia   Abscess of foot without toes, left  Derrick Hendrix is a 76 year old male who presented with left foot abscess. In the ED it was drained producing purulent discharge. He was started on cefazolin and admitted to the floor. The abscess is most likely secondary to unknown injury with subsequent bacterial infection as the patient has dry feet at baseline.   Abscess of left foot Patient presents with pain and tenderness in the dorsal aspect of his left foot near the heel. ED physician drained abscess with purulent discharge. -IV Ancef 1 gram daily -Norco 5 Q6 PRN for pain  Diabetes Mellitus: Not previously diabetic, presented with blood glucose of 517- decreased to 232 by 19:12pm -will keep patient on sensitive  sliding scale insulin -discontinued continuous insulin -continue to monitor for improvement  CKD III with acute injury: Patient has a history of stable CKD III, Cr 2.46 on admission with GFR decreased to 24 Cr baseline typically 1.5 -Fluids at 133ml/hr normal saline for rehydration    Hyporeninemic hypoaldosteronism -Fludrocortisone 0.1mg  daily -monitor BMP daily  Hyperkalemia Potassium is 4.1 today -will continue to monitor  Diet: regular Fluids: 133ml sodium chloride  DVT ppx: heparin Code: Full Dispo: Admit patient to Inpatient with expected length of stay greater than 2 midnights.  Signed: Kathi Ludwig, MD 08/28/2017, 8:30 PM  Pager: Pager# [phone]

## 2017-08-28 NOTE — ED Notes (Signed)
EDP at bedside draining abscess from heel of foot.

## 2017-08-28 NOTE — ED Notes (Signed)
Unsuccessful attempt at giving report to 5N.

## 2017-08-28 NOTE — Progress Notes (Signed)
Preliminary Results  Left Lower Extremity duplex reveals no evidence of DVT. No evidence of Bakers Cyst. RT CFV appears patent. Report called to patients nurse.   Arn Medal RVT RDCS

## 2017-08-28 NOTE — ED Notes (Signed)
This writer attempted IV in left [facility] with no success, pt reports this is where "they normally get IV".

## 2017-08-28 NOTE — ED Notes (Signed)
Patient transported to CT 

## 2017-08-28 NOTE — ED Notes (Signed)
Floor unsure if they can take patient. They are checking with [facility] and will call me back.

## 2017-08-28 NOTE — Progress Notes (Signed)
Pharmacy Antibiotic Note  Derrick Hendrix is a 76 y.o. male admitted on 08/28/2017 with wound infection.  Pharmacy has been consulted for Unasyn dosing. WBC 11.7, afebrile  Plan: Unasyn 3G IV q12 hours F/U clinical progression and LOT     Temp (24hrs), Avg:97.5 F (36.4 C), Min:97.5 F (36.4 C), Max:97.5 F (36.4 C)   Recent Labs Lab 08/28/17 1209  WBC 11.7*  CREATININE 2.46*    Estimated Creatinine Clearance: 28.8 mL/min (A) (by C-G formula based on SCr of 2.46 mg/dL (H)).    Allergies  Allergen Reactions  . Aspirin Other (See Comments)    Nosebleeds     Thank you for allowing pharmacy to be a part of this patient's care.  Jodean Lima Darek Eifler 08/28/2017 7:28 PM

## 2017-08-28 NOTE — ED Notes (Signed)
On way to CT 

## 2017-08-28 NOTE — ED Notes (Signed)
CRITICAL VALUE ALERT  Critical Value: 517   Date & Time Notied:  08/28/2017 13:18pm  Provider Notified: Armstead Peaks PA  Orders Received/Actions taken:  :

## 2017-08-29 DIAGNOSIS — F329 Major depressive disorder, single episode, unspecified: Secondary | ICD-10-CM

## 2017-08-29 DIAGNOSIS — E11621 Type 2 diabetes mellitus with foot ulcer: Secondary | ICD-10-CM

## 2017-08-29 DIAGNOSIS — Z87891 Personal history of nicotine dependence: Secondary | ICD-10-CM

## 2017-08-29 DIAGNOSIS — Z833 Family history of diabetes mellitus: Secondary | ICD-10-CM

## 2017-08-29 DIAGNOSIS — Z8249 Family history of ischemic heart disease and other diseases of the circulatory system: Secondary | ICD-10-CM

## 2017-08-29 DIAGNOSIS — I129 Hypertensive chronic kidney disease with stage 1 through stage 4 chronic kidney disease, or unspecified chronic kidney disease: Secondary | ICD-10-CM

## 2017-08-29 DIAGNOSIS — L97429 Non-pressure chronic ulcer of left heel and midfoot with unspecified severity: Secondary | ICD-10-CM

## 2017-08-29 DIAGNOSIS — K5900 Constipation, unspecified: Secondary | ICD-10-CM

## 2017-08-29 DIAGNOSIS — E1122 Type 2 diabetes mellitus with diabetic chronic kidney disease: Secondary | ICD-10-CM

## 2017-08-29 DIAGNOSIS — Z8739 Personal history of other diseases of the musculoskeletal system and connective tissue: Secondary | ICD-10-CM

## 2017-08-29 DIAGNOSIS — Z8673 Personal history of transient ischemic attack (TIA), and cerebral infarction without residual deficits: Secondary | ICD-10-CM

## 2017-08-29 DIAGNOSIS — E2749 Other adrenocortical insufficiency: Secondary | ICD-10-CM

## 2017-08-29 DIAGNOSIS — N183 Chronic kidney disease, stage 3 (moderate): Secondary | ICD-10-CM

## 2017-08-29 DIAGNOSIS — Z886 Allergy status to analgesic agent status: Secondary | ICD-10-CM

## 2017-08-29 DIAGNOSIS — E1151 Type 2 diabetes mellitus with diabetic peripheral angiopathy without gangrene: Secondary | ICD-10-CM

## 2017-08-29 DIAGNOSIS — E875 Hyperkalemia: Secondary | ICD-10-CM

## 2017-08-29 LAB — BASIC METABOLIC PANEL
ANION GAP: 7 (ref 5–15)
BUN: 37 mg/dL — AB (ref 6–20)
CALCIUM: 8.3 mg/dL — AB (ref 8.9–10.3)
CO2: 22 mmol/L (ref 22–32)
Chloride: 107 mmol/L (ref 101–111)
Creatinine, Ser: 1.98 mg/dL — ABNORMAL HIGH (ref 0.61–1.24)
GFR calc Af Amer: 35 mL/min — ABNORMAL LOW (ref >60)
GFR, EST NON AFRICAN AMERICAN: 31 mL/min — AB (ref >60)
GLUCOSE: 205 mg/dL — AB (ref 65–99)
Potassium: 3.5 mmol/L (ref 3.5–5.1)
Sodium: 136 mmol/L (ref 135–145)

## 2017-08-29 LAB — CBC
HCT: 36.5 % — ABNORMAL LOW (ref 39.0–52.0)
Hemoglobin: 11.6 g/dL — ABNORMAL LOW (ref 13.0–17.0)
MCH: 28.7 pg (ref 26.0–34.0)
MCHC: 31.8 g/dL (ref 30.0–36.0)
MCV: 90.3 fL (ref 78.0–100.0)
Platelets: 249 10*3/uL (ref 150–400)
RBC: 4.04 MIL/uL — ABNORMAL LOW (ref 4.22–5.81)
RDW: 14.6 % (ref 11.5–15.5)
WBC: 8.1 10*3/uL (ref 4.0–10.5)

## 2017-08-29 LAB — GLUCOSE, CAPILLARY
GLUCOSE-CAPILLARY: 203 mg/dL — AB (ref 65–99)
Glucose-Capillary: 239 mg/dL — ABNORMAL HIGH (ref 65–99)
Glucose-Capillary: 272 mg/dL — ABNORMAL HIGH (ref 65–99)
Glucose-Capillary: 195 mg/dL — ABNORMAL HIGH (ref 65–99)
Glucose-Capillary: 112 mg/dL — ABNORMAL HIGH (ref 65–99)

## 2017-08-29 MED ORDER — DOXYCYCLINE HYCLATE 100 MG PO TABS
100.0000 mg | ORAL_TABLET | Freq: Two times a day (BID) | ORAL | Status: DC
  Administered 2017-08-29 – 2017-08-30 (×2): 100 mg via ORAL
  Filled 2017-08-29 (×2): qty 1

## 2017-08-29 MED ORDER — POTASSIUM CHLORIDE CRYS ER 20 MEQ PO TBCR
40.0000 meq | EXTENDED_RELEASE_TABLET | Freq: Once | ORAL | Status: AC
  Administered 2017-08-29: 40 meq via ORAL
  Filled 2017-08-29: qty 2

## 2017-08-29 NOTE — Discharge Summary (Signed)
Name: Tywone Bembenek MRN: [id number] DOB: 02/02/1941 76 y.o. PCP: [name], MD  Date of Admission: 08/28/2017 11:14 AM Date of Discharge: 08/31/2017 Attending Physician: No att. providers found  Discharge Diagnosis: Active Problems:   CKD (chronic kidney disease), stage III   Hyporeninemic hypoaldosteronism (HCC)   Hyperkalemia   Abscess of foot without toes, left   Discharge Medications: Allergies as of 08/30/2017      Reactions   Aspirin Other (See Comments)   Nosebleeds       Medication List    TAKE these medications   acetaminophen 500 MG tablet Commonly known as:  TYLENOL Take 2 tablets (1,000 mg total) by mouth every 6 (six) hours as needed for mild pain (or Fever >/= 101).   amLODipine 5 MG tablet Commonly known as:  NORVASC Take 1 tablet (5 mg total) by mouth daily.   amoxicillin-clavulanate 875-125 MG tablet Commonly known as:  AUGMENTIN Take 1 tablet by mouth 2 (two) times daily.   atorvastatin 80 MG tablet Commonly known as:  LIPITOR Take 1 tablet (80 mg total) by mouth daily.   clopidogrel 75 MG tablet Commonly known as:  PLAVIX TAKE 1 TABLET (75 MG TOTAL) BY MOUTH DAILY.   doxycycline 100 MG tablet Commonly known as:  VIBRA-TABS Take 1 tablet (100 mg total) by mouth every 12 (twelve) hours.   ergocalciferol 50000 units capsule Commonly known as:  VITAMIN D2 Take 50,000 Units by mouth once a week.   finasteride 5 MG tablet Commonly known as:  PROSCAR Take 1 tablet (5 mg total) by mouth daily.   fludrocortisone 0.1 MG tablet Commonly known as:  FLORINEF Take 1 tablet (0.1 mg total) by mouth daily. What changed:  See the new instructions.   furosemide 40 MG tablet Commonly known as:  LASIX Take 1 tablet (40 mg total) by mouth daily.   HYDROcodone-acetaminophen 5-325 MG tablet Commonly known as:  NORCO/VICODIN Take 1 tablet by mouth every 6 (six) hours as needed for moderate pain.   ipratropium 0.06 % nasal spray Commonly  known as:  ATROVENT Place 2 sprays into the nose 3 (three) times daily. What changed:  when to take this  reasons to take this   linagliptin 5 MG Tabs tablet Commonly known as:  TRADJENTA Take 1 tablet (5 mg total) by mouth daily.   mupirocin ointment 2 % Commonly known as:  BACTROBAN Apply 1 application topically 2 (two) times daily.   nitroGLYCERIN 0.4 MG SL tablet Commonly known as:  NITROSTAT Place 1 tablet (0.4 mg total) under the tongue every 5 (five) minutes x 3 doses as needed for chest pain.   polyethylene glycol packet Commonly known as:  MIRALAX Take 17 g by mouth daily as needed (constipation).   tamsulosin 0.4 MG Caps capsule Commonly known as:  FLOMAX TAKE 1 CAPSULE (0.4 MG TOTAL) BY MOUTH DAILY AFTER SUPPER.            Discharge Care Instructions        Start     Ordered   08/31/17 0000  fludrocortisone (FLORINEF) 0.1 MG tablet  Daily     08/30/17 1334   08/30/17 0000  doxycycline (VIBRA-TABS) 100 MG tablet  Every 12 hours     08/30/17 1334   08/30/17 0000  HYDROcodone-acetaminophen (NORCO/VICODIN) 5-325 MG tablet  Every 6 hours PRN     08/30/17 1334   08/30/17 0000  amoxicillin-clavulanate (AUGMENTIN) 875-125 MG tablet  2 times daily     08/30/17  1334   08/30/17 0000  linagliptin (TRADJENTA) 5 MG TABS tablet  Daily     08/30/17 1334   08/30/17 0000  Increase activity slowly     08/30/17 1334   08/30/17 0000  Diet - low sodium heart healthy     08/30/17 1334   08/30/17 0000  Discharge instructions    Comments:  It was pleasure taking care of you. I'm giving you to antibiotics for your foot infection, please take it twice daily for 10 days. I am also starting you a new medication for your blood sugar, you will take it 1 pill daily. Please call the clinic and make an follow-up appointment to be seen within one week.   08/30/17 1334      Disposition and follow-up:   Mr.Agamjot Cheramie was discharged from [facility] in Good  condition.  At the hospital follow up visit please address:  1.  A. Newly developed diabetes mellitus      B. Cellulitis of his left foot      C. Please consider 0.05 mg of fludrocortisone, see Dr. Jodene Nam previous clinic note, if patient remains           hypertensive.  2.  Labs / imaging needed at time of follow-up: wound check and CBG  3.  Pending labs/ test needing follow-up: n/a  Follow-up Appointments: Follow-up Information    [name], MD Follow up.   Specialty:  Internal Medicine Why:  please call the clinic and make an appointment to be seen within one week. Contact information: [address] [id number] [place] [facility] Course by problem list: Active Problems:   CKD (chronic kidney disease), stage III   Hyporeninemic hypoaldosteronism (HCC)   Hyperkalemia   Abscess of foot without toes, left   1. Abscess of foot the plantar aspect of the foot: Mr. Wai was admitted for tenderness and swelling of the plantar surface of his left foot hich was first observed. He was diagnosed with cellulitis, I&D was performed in the ED by the ED physician, and he was admitted for antibiotics. Cultures were taken which indicated predominant PMNs, gram-positive cocci in clusters and gram-negative rods. Patient was discharged on doxycycline 100 mg twice a day and Augmentin 1 tablet twice a day. Patient remained afebrile with his heart rate within normal limits.   2. Hyporeninemic hypoaldosteronism: Patient has a history of hypoaldosteronism treated with fludrocortisone. Patient's potassium remained 3.5-4.1 during his stay. If patient remains hypertensive please consider decreasing his fludrocortisone to 0.05. Patient's fludrocortisone was decreased from 0.1 mg twice a day to 0.1 mg daily. He remained hypertensive during his stay prompting the decrease.  3. CKD stage III: Patients creatinine 1.71 on discharge down from 1.98 the day before. GFR  was slightly above baseline during his stay.    Discharge Vitals:   BP (!) 151/95 (BP Location: Right Arm)   Pulse 87   Temp 97.6 F (36.4 C) (Oral)   Resp 18   SpO2 97%   Pertinent Labs, Studies, and Procedures:  CBC Latest Ref Rng & Units 08/31/2017 08/29/2017 08/28/2017  WBC 4.0 - 10.5 K/uL 7.7 8.1 12.4(H)  Hemoglobin 13.0 - 17.0 g/dL 12.1(L) 11.6(L) 12.8(L)  Hematocrit 39.0 - 52.0 % 37.9(L) 36.5(L) 37.4(L)  Platelets 150 - 400 K/uL 308 249 276   CMP Latest Ref Rng & Units 08/31/2017 08/30/2017 08/29/2017  Glucose 65 - 99 mg/dL 186(H) 138(H) 205(H)  BUN 6 - 20 mg/dL 26(H) 31(H) 37(H)  Creatinine 0.61 - 1.24 mg/dL 1.82(H) 1.71(H) 1.98(H)  Sodium 135 - 145 mmol/L 139 139 136  Potassium 3.5 - 5.1 mmol/L 4.1 3.9 3.5  Chloride 101 - 111 mmol/L 107 110 107  CO2 22 - 32 mmol/L 24 21(L) 22  Calcium 8.9 - 10.3 mg/dL 8.8(L) 8.1(L) 8.3(L)  Total Protein 6.5 - 8.1 g/dL 7.3 - -  Total Bilirubin 0.3 - 1.2 mg/dL 1.0 - -  Alkaline Phos 38 - 126 U/L 94 - -  AST 15 - 41 U/L 32 - -  ALT 17 - 63 U/L 19 - -    Discharge Instructions: Discharge Instructions    Diet - low sodium heart healthy    Complete by:  As directed    Discharge instructions    Complete by:  As directed    It was pleasure taking care of you. I'm giving you to antibiotics for your foot infection, please take it twice daily for 10 days. I am also starting you a new medication for your blood sugar, you will take it 1 pill daily. Please call the clinic and make an follow-up appointment to be seen within one week.   Increase activity slowly    Complete by:  As directed       Signed: Kathi Ludwig, MD 08/31/2017, 2:42 PM   Pager: Pager# [phone]

## 2017-08-29 NOTE — Progress Notes (Signed)
   Subjective: Patient was resting comfortably in his bed appointment entering the room this morning, he just finished his entire breakfast. Patient admitted that his breakfast was good and that he had overindulged as a result. In addition the patient stated that that the pain is slightly improved, although he is still unable to ambulate on that foot without extreme pain. In addition, the patient continues to deny fever, chills, headache, visual changes, chest pain, muscle aches or pain this time. Patient continues to admit to abdominal pain only with attempts to reach his feet, he states that this is a cramping pain and has been present present for very many months.  Objective:  Vital signs in last 24 hours: Vitals:   08/28/17 1815 08/28/17 1830 08/28/17 2025 08/29/17 0506  BP: 129/81 136/87 (!) 157/86 126/77  Pulse: 85 85 90 85  Resp:      Temp:   97.8 F (36.6 C) 98 F (36.7 C)  TempSrc:   Oral Oral  SpO2: 92% 93% 97% 95%   ROS negative except as per HPI.  Physical Exam  Constitutional: He appears well-developed and well-nourished. No distress.  HENT:  Head: Normocephalic and atraumatic.  Eyes: Conjunctivae and EOM are normal.  Neck: Normal range of motion.  Cardiovascular: Normal rate and regular rhythm.   No murmur heard. Pulmonary/Chest: Effort normal and breath sounds normal. No stridor. No respiratory distress.  Abdominal: Soft. Bowel sounds are normal. He exhibits no distension.  Musculoskeletal: He exhibits no edema.  Neurological: He is alert.  Skin: Skin is warm. He is not diaphoretic.  Psychiatric: He has a normal mood and affect.  Vitals reviewed.  Assessment/Plan:  Active Problems:   CKD (chronic kidney disease), stage III   Hyporeninemic hypoaldosteronism (HCC)   Hyperkalemia   Abscess of foot without toes, left   Abscess of left foot Patient presents with pain and tenderness in the dorsal aspect of his left foot near the heel. ED physician drained abscess  with purulent discharge. -Slightly improved today, remained tender to touch -will continue to monitor for improvement overnight -IV Ancef 1 gram daily -Norco 5 Q6 PRN for pain -Patients WBC improving -ABI performed this past Monday -initiated Doxycycline 100mg  BID today  Diabetes Mellitus: Not previously diagnosed as a diabetic, presented with blood glucose of 517- decreased to 272 at 12:45 -Hgb A1c 11.1, was 5.7 1 yr prior,  -patient will need diabetes consultation, order placed, will await their recommendations and assistance  -will keep patient on sensitive sliding scale insulin -continue to monitor for improvement  CKD III with acute injury: Patient has a history of stable CKD III, Cr 2.46 on admission with GFR decreased to 24 Cr baseline typically 1.5, today its is 1.98, GFR 31 -Fluids at 131ml/hr normal saline for rehydration    Hyporeninemic hypoaldosteronism -Fludrocortisone 0.1mg  daily at half his home dose -monitor BMP daily  Hyperkalemia Potassium is 3.5 today -will give one time dose of PO potassium 39mEq today -BMP in am -will continue to monitor  Diet: regular Fluids: 154ml sodium chloride  DVT ppx: heparin Code: Full Dispo: Anticipated discharge in approximately 1-2 day(s).   Kathi Ludwig, MD 08/29/2017, 1:24 PM Pager: Pager# [phone]

## 2017-08-30 DIAGNOSIS — B9689 Other specified bacterial agents as the cause of diseases classified elsewhere: Secondary | ICD-10-CM

## 2017-08-30 DIAGNOSIS — L03116 Cellulitis of left lower limb: Secondary | ICD-10-CM

## 2017-08-30 LAB — GLUCOSE, CAPILLARY
Glucose-Capillary: 133 mg/dL — ABNORMAL HIGH (ref 65–99)
Glucose-Capillary: 143 mg/dL — ABNORMAL HIGH (ref 65–99)

## 2017-08-30 LAB — BASIC METABOLIC PANEL
ANION GAP: 8 (ref 5–15)
BUN: 31 mg/dL — ABNORMAL HIGH (ref 6–20)
CHLORIDE: 110 mmol/L (ref 101–111)
CO2: 21 mmol/L — AB (ref 22–32)
Calcium: 8.1 mg/dL — ABNORMAL LOW (ref 8.9–10.3)
Creatinine, Ser: 1.71 mg/dL — ABNORMAL HIGH (ref 0.61–1.24)
GFR calc non Af Amer: 37 mL/min — ABNORMAL LOW (ref >60)
GFR, EST AFRICAN AMERICAN: 42 mL/min — AB (ref >60)
Glucose, Bld: 138 mg/dL — ABNORMAL HIGH (ref 65–99)
Potassium: 3.9 mmol/L (ref 3.5–5.1)
Sodium: 139 mmol/L (ref 135–145)

## 2017-08-30 MED ORDER — SODIUM CHLORIDE 0.9 % IV SOLN
3.0000 g | Freq: Three times a day (TID) | INTRAVENOUS | Status: DC
  Filled 2017-08-30: qty 3

## 2017-08-30 MED ORDER — DOXYCYCLINE HYCLATE 100 MG PO TABS: 100.0000 mg | ORAL_TABLET | Freq: Two times a day (BID) | ORAL | 0 refills | Status: DC

## 2017-08-30 MED ORDER — AMOXICILLIN-POT CLAVULANATE 875-125 MG PO TABS: 1.0000 | ORAL_TABLET | Freq: Two times a day (BID) | ORAL | 0 refills | Status: DC

## 2017-08-30 MED ORDER — LINAGLIPTIN 5 MG PO TABS: 5.0000 mg | ORAL_TABLET | Freq: Every day | ORAL | 2 refills | Status: DC

## 2017-08-30 MED ORDER — HYDROCODONE-ACETAMINOPHEN 5-325 MG PO TABS: 1.0000 | ORAL_TABLET | Freq: Four times a day (QID) | ORAL | 0 refills | Status: DC | PRN

## 2017-08-30 MED ORDER — FLUDROCORTISONE ACETATE 0.1 MG PO TABS: 0.1000 mg | ORAL_TABLET | Freq: Every day | ORAL | 0 refills | Status: DC

## 2017-08-30 MED ORDER — INSULIN GLARGINE 100 UNIT/ML ~~LOC~~ SOLN
10.0000 [IU] | Freq: Every day | SUBCUTANEOUS | Status: DC
  Filled 2017-08-30: qty 0.1

## 2017-08-30 NOTE — Discharge Instructions (Signed)
Antibiotic Medicine, Adult Antibiotic medicines treat infections caused by a type of germ called bacteria. They work by killing the bacteria that make you sick. When do I need to take antibiotics? You often need these medicines to treat bacterial infections, such as:  A urinary tract infection (UTI).  Strep throat.  Meningitis. This affects the spinal cord and brain.  A bad lung infection.  You may start the medicines while your doctor waits for tests to come back. When the tests come back, your doctor may change or stop your medicine. When are antibiotics not needed? You do not need these medicines for most common illnesses, such as:  A cold.  The flu.  A sore throat.  Antibiotics are not always needed for all infections caused by bacteria. Do not ask for these medicines, or take them, when they are not needed. What are the risks of taking antibiotics? Most antibiotics can cause an infection called Clostridium difficile.This causes watery poop (diarrhea). Let your doctor know right away if:  You have watery poop while taking an antibiotic.  You have watery poop after you stop taking an antibiotic. The illness can happen weeks after you stop the medicine.  You also have a risk of getting an infection in the future that antibiotics cannot treat (antibiotic-resistant infection). This type of infection can be dangerous. What else should I know about taking antibiotics?  You need to take the entire prescription. ? Take the medicine for as long as told by your doctor. ? Do not stop taking it even if you start to feel better.  Try not to miss any doses. If you miss a dose, call your doctor.  Birth control pills may not work. If you take birth control pills: ? Keep on taking them. ? Use a second form of birth control, such as a condom. Do this for as long as told by your doctor.  Ask your doctor: ? How long to wait in between doses. ? If you should take the medicine with  food. ? If there is anything you should stay away from while taking the antibiotic, such as: ? Food. ? Drinks. ? Medicines. ? If there are any side effects you should watch for.  Only take the medicines that your doctor told you to take. Do not take medicines that were given to someone else.  Drink a large glass of water with the medicine.  Ask the pharmacist for a tool to measure the medicine, such as: ? A syringe. ? A cup. ? A spoon.  Throw away any extra medicine. Contact a doctor if:  You get worse.  You have new joint pain or muscle aches after starting the medicine.  You have side effects from the medicine, such as: ? Stomach pain. ? Watery poop. ? Feeling sick to your stomach (nausea). Get help right away if:  You have signs of a very bad allergic reaction. If this happens, stop taking the medicine right away. Signs may include: ? Hives. These are raised, itchy, red bumps on the skin. ? Skin rash. ? Trouble breathing. ? Wheezing. ? Swelling. ? Feeling dizzy. ? Throwing up (vomiting).  Your pee (urine) is dark, or is the color of blood.  Your skin turns yellow.  You bruise easily.  You bleed easily.  You have very bad watery poop and cramps in your belly.  You have a very bad headache. Summary  Antibiotics are often used to treat infections caused by bacteria.  Only take these  medicines when needed.  Let your doctor know if you have watery poop while taking an antibiotic.  You need to take the entire prescription. This information is not intended to replace advice given to you by your health care provider. Make sure you discuss any questions you have with your health care provider. Document Released: 09/09/2008 Document Revised: 12/03/2016 Document Reviewed: 12/03/2016 Elsevier Interactive Patient Education  2017 [employer] Cellulitis, Adult Cellulitis is a skin infection. The infected area is usually red and sore. This condition occurs most  often in the arms and lower legs. It is very important to get treated for this condition. Follow these instructions at home:  Take over-the-counter and prescription medicines only as told by your doctor.  If you were prescribed an antibiotic medicine, take it as told by your doctor. Do not stop taking the antibiotic even if you start to feel better.  Drink enough fluid to keep your pee (urine) clear or pale yellow.  Do not touch or rub the infected area.  Raise (elevate) the infected area above the level of your heart while you are sitting or lying down.  Place warm or cold wet cloths (warm or cold compresses) on the infected area. Do this as told by your doctor.  Keep all follow-up visits as told by your doctor. This is important. These visits let your doctor make sure your infection is not getting worse. Contact a doctor if:  You have a fever.  Your symptoms do not get better after 1-2 days of treatment.  Your bone or joint under the infected area starts to hurt after the skin has healed.  Your infection comes back. This can happen in the same area or another area.  You have a swollen bump in the infected area.  You have new symptoms.  You feel ill and also have muscle aches and pains. Get help right away if:  Your symptoms get worse.  You feel very sleepy.  You throw up (vomit) or have watery poop (diarrhea) for a long time.  There are red streaks coming from the infected area.  Your red area gets larger.  Your red area turns darker. This information is not intended to replace advice given to you by your health care provider. Make sure you discuss any questions you have with your health care provider. Document Released: 05/19/2008 Document Revised: 05/08/2016 Document Reviewed: 10/10/2015 Elsevier Interactive Patient Education  2018 [employer].

## 2017-08-30 NOTE — Progress Notes (Signed)
Internal Medicine Attending:   I saw and examined the patient. I reviewed the resident's note and I agree with the resident's findings and plan as documented in the resident's note. Reports feeling well, no complaints.  Drainage has decreased.  On exam: no distress, heart RRR, lungs CTAB, left heel remains tender no active drainage.  A/P Abscess of left foot -Gram stain is G+ cocci and G- rods - Will plan to d/c on doxycycline and augmentin. -Patient has follow up next week with podiatry, will also arrange for [facility] follow up given new dx of diabetes   Type 2 DM - I suspect this may be partially worsened by his infection, after IVF he has only needed small amounts of insulin here, I think medication complaince will be an issue and I am concerned he will have difficulty with even once daily lantus.  Therefore I would favor starting Januvia on discharge and have him follow up closely.  Hyporeninemic Hypoaldosteronism and Essential HTN - I do not see a need for him to be on 0.2mg  of flornief as he is hypertensive and his potassium was actually on the low side.  Will decrease to 0.1mg  this may be decreased further if he remains hypertensive.

## 2017-08-30 NOTE — Clinical Social Work Note (Signed)
Clinical Social Worker provided RN with cab voucher for patient to return home.  Clinical Social Worker will sign off for now as social work intervention is no longer needed. Please consult [place] again if new need arises.  Barbette Or, [place]

## 2017-08-30 NOTE — Progress Notes (Signed)
PHARMACY NOTE:  ANTIMICROBIAL RENAL DOSAGE ADJUSTMENT  Current antimicrobial regimen includes a mismatch between antimicrobial dosage and estimated renal function.  As per policy approved by the Pharmacy & Therapeutics and Medical Executive Committees, the antimicrobial dosage will be adjusted accordingly.  Current antimicrobial dosage:  3g q12h  Indication: abscess of foot  Renal Function:  Estimated Creatinine Clearance: 41.4 mL/min (A) (by C-G formula based on SCr of 1.71 mg/dL (H)). []      On intermittent HD, scheduled: []      On CRRT    Antimicrobial dosage has been changed to:  3g q8hrs  Additional comments: planning to discharge today, however changed in case patient should stay here overnight.   Thank you for allowing pharmacy to be a part of this patient's care.  Carlean Jews, [facility] 08/30/2017 2:39 PM

## 2017-08-30 NOTE — Progress Notes (Signed)
Pharmacy Antibiotic Note  Iban Utz is a 76 y.o. male admitted on 08/28/2017 with wound infection.  Pharmacy has been consulted for Unasyn dosing.  WBC wnl, afeb.  Cr improved  Plan: Change Unasyn 3G IV to q8 F/U clinical progression and LOT     Temp (24hrs), Avg:97.7 F (36.5 C), Min:97.6 F (36.4 C), Max:97.8 F (36.6 C)   Recent Labs Lab 08/28/17 1209 08/28/17 2036 08/29/17 0323 08/30/17 0218  WBC 11.7* 12.4* 8.1  --   CREATININE 2.46* 2.02* 1.98* 1.71*    Estimated Creatinine Clearance: 41.4 mL/min (A) (by C-G formula based on SCr of 1.71 mg/dL (H)).    Allergies  Allergen Reactions  . Aspirin Other (See Comments)    Nosebleeds     Thank you for allowing pharmacy to be a part of this patient's care.  Lewie Chamber., PharmD Clinical Pharmacist [facility]

## 2017-08-30 NOTE — Progress Notes (Signed)
   Subjective:  Patient was feeling better this morning, resting in his bed. His foot pain is improving. He was complaining of mild abdominal discomfort after eating,saying he might be eating too much. denies any nausea or vomiting. He is having regular bowel movements.He wants to go home.  Objective:  Vital signs in last 24 hours: Vitals:   08/29/17 0506 08/29/17 1355 08/29/17 1919 08/30/17 0538  BP: 126/77 (!) 151/74 (!) 142/73 (!) 151/95  Pulse: 85 92 87 87  Resp:  18    Temp: 98 F (36.7 C) 98 F (36.7 C) 97.8 F (36.6 C) 97.6 F (36.4 C)  TempSrc: Oral Oral Oral Oral  SpO2: 95% 96% 95% 97%   Gen. Well-developed, well-nourished gentleman, with repetative body movements appears to have tics?, In no acute distress. Extremities. No obvious drainage from his left foot abscess, erythema and edema markedly improved. Mild surrounding tenderness. Chest. Clear bilaterally. CV. Regular rate and rhythm. Abdomen.soft, non tender, bowel sounds positive.  Assessment/Plan:  Abscess of left foot: Improving with minimum drainage today.culture results still pending. he can be discharged home on oral doxycycline and Augmentin- His antibiotic regimen can be readjusted during his follow-up visit once culture results become available.  Diabetes Mellitus: new diagnosis with A1c of 11.1. His blood sugar responded very well to sensitive sliding scale. Will be discharged home on Januvia 50 mg daily.  CKD III with acute injury: creatinine improving, it was 1.71 today with baseline around 1.5. -It can be monitored as an outpatient.  Hyporeninemic hypoaldosteronism -Fludrocortisone 0.1mg  daily at half his home dose. His dose can be decreased to 0.05 mg daily if he remains hypertensive.   Dispo: being discharged today.  Lorella Nimrod, MD 08/30/2017, 12:24 PM Pager: [phone]

## 2017-08-31 ENCOUNTER — Telehealth: Payer: Self-pay | Admitting: *Deleted

## 2017-08-31 ENCOUNTER — Encounter (HOSPITAL_COMMUNITY): Payer: Self-pay | Admitting: *Deleted

## 2017-08-31 ENCOUNTER — Observation Stay (HOSPITAL_COMMUNITY): Payer: Medicare Other

## 2017-08-31 ENCOUNTER — Observation Stay (HOSPITAL_COMMUNITY)
Admission: EM | Admit: 2017-08-31 | Discharge: 2017-09-01 | Disposition: A | Discharge status: Home / Self Care | Payer: Medicare Other | Attending: Internal Medicine | Admitting: Internal Medicine

## 2017-08-31 ENCOUNTER — Telehealth: Payer: Self-pay | Admitting: Internal Medicine

## 2017-08-31 ENCOUNTER — Emergency Department (HOSPITAL_COMMUNITY): Payer: Medicare Other

## 2017-08-31 ENCOUNTER — Other Ambulatory Visit: Payer: Self-pay

## 2017-08-31 DIAGNOSIS — I129 Hypertensive chronic kidney disease with stage 1 through stage 4 chronic kidney disease, or unspecified chronic kidney disease: Secondary | ICD-10-CM | POA: Diagnosis not present

## 2017-08-31 DIAGNOSIS — I6782 Cerebral ischemia: Secondary | ICD-10-CM | POA: Insufficient documentation

## 2017-08-31 DIAGNOSIS — R260 Ataxic gait: Secondary | ICD-10-CM | POA: Insufficient documentation

## 2017-08-31 DIAGNOSIS — L02612 Cutaneous abscess of left foot: Secondary | ICD-10-CM | POA: Diagnosis not present

## 2017-08-31 DIAGNOSIS — Z8673 Personal history of transient ischemic attack (TIA), and cerebral infarction without residual deficits: Secondary | ICD-10-CM | POA: Insufficient documentation

## 2017-08-31 DIAGNOSIS — Z23 Encounter for immunization: Secondary | ICD-10-CM | POA: Insufficient documentation

## 2017-08-31 DIAGNOSIS — E7800 Pure hypercholesterolemia, unspecified: Secondary | ICD-10-CM | POA: Insufficient documentation

## 2017-08-31 DIAGNOSIS — E1122 Type 2 diabetes mellitus with diabetic chronic kidney disease: Secondary | ICD-10-CM | POA: Insufficient documentation

## 2017-08-31 DIAGNOSIS — G255 Other chorea: Principal | ICD-10-CM

## 2017-08-31 DIAGNOSIS — Z7902 Long term (current) use of antithrombotics/antiplatelets: Secondary | ICD-10-CM | POA: Diagnosis not present

## 2017-08-31 DIAGNOSIS — N183 Chronic kidney disease, stage 3 (moderate): Secondary | ICD-10-CM | POA: Insufficient documentation

## 2017-08-31 DIAGNOSIS — R4182 Altered mental status, unspecified: Secondary | ICD-10-CM | POA: Diagnosis not present

## 2017-08-31 DIAGNOSIS — R259 Unspecified abnormal involuntary movements: Secondary | ICD-10-CM

## 2017-08-31 DIAGNOSIS — I6523 Occlusion and stenosis of bilateral carotid arteries: Secondary | ICD-10-CM | POA: Diagnosis not present

## 2017-08-31 DIAGNOSIS — N184 Chronic kidney disease, stage 4 (severe): Secondary | ICD-10-CM | POA: Diagnosis present

## 2017-08-31 DIAGNOSIS — E2749 Other adrenocortical insufficiency: Secondary | ICD-10-CM | POA: Insufficient documentation

## 2017-08-31 DIAGNOSIS — R4781 Slurred speech: Secondary | ICD-10-CM | POA: Diagnosis not present

## 2017-08-31 DIAGNOSIS — Z79899 Other long term (current) drug therapy: Secondary | ICD-10-CM | POA: Insufficient documentation

## 2017-08-31 DIAGNOSIS — Z7983 Long term (current) use of bisphosphonates: Secondary | ICD-10-CM | POA: Diagnosis not present

## 2017-08-31 DIAGNOSIS — I639 Cerebral infarction, unspecified: Secondary | ICD-10-CM | POA: Diagnosis not present

## 2017-08-31 DIAGNOSIS — E1151 Type 2 diabetes mellitus with diabetic peripheral angiopathy without gangrene: Secondary | ICD-10-CM | POA: Diagnosis not present

## 2017-08-31 DIAGNOSIS — N185 Chronic kidney disease, stage 5: Secondary | ICD-10-CM | POA: Diagnosis present

## 2017-08-31 DIAGNOSIS — R9431 Abnormal electrocardiogram [ECG] [EKG]: Secondary | ICD-10-CM | POA: Diagnosis not present

## 2017-08-31 LAB — CBG MONITORING, ED: Glucose-Capillary: 186 mg/dL — ABNORMAL HIGH (ref 65–99)

## 2017-08-31 LAB — COMPREHENSIVE METABOLIC PANEL
ALT: 19 U/L (ref 17–63)
AST: 32 U/L (ref 15–41)
Albumin: 3.4 g/dL — ABNORMAL LOW (ref 3.5–5.0)
Alkaline Phosphatase: 94 U/L (ref 38–126)
Anion gap: 8 (ref 5–15)
BILIRUBIN TOTAL: 1 mg/dL (ref 0.3–1.2)
BUN: 26 mg/dL — AB (ref 6–20)
CHLORIDE: 107 mmol/L (ref 101–111)
CO2: 24 mmol/L (ref 22–32)
CREATININE: 1.82 mg/dL — AB (ref 0.61–1.24)
Calcium: 8.8 mg/dL — ABNORMAL LOW (ref 8.9–10.3)
GFR calc Af Amer: 39 mL/min — ABNORMAL LOW (ref >60)
GFR, EST NON AFRICAN AMERICAN: 34 mL/min — AB (ref >60)
Glucose, Bld: 186 mg/dL — ABNORMAL HIGH (ref 65–99)
Potassium: 4.1 mmol/L (ref 3.5–5.1)
Sodium: 139 mmol/L (ref 135–145)
TOTAL PROTEIN: 7.3 g/dL (ref 6.5–8.1)

## 2017-08-31 LAB — RAPID URINE DRUG SCREEN, HOSP PERFORMED
AMPHETAMINES: NOT DETECTED
BARBITURATES: NOT DETECTED
Benzodiazepines: NOT DETECTED
Cocaine: NOT DETECTED
Opiates: NOT DETECTED
Tetrahydrocannabinol: NOT DETECTED

## 2017-08-31 LAB — CBC
HCT: 37.9 % — ABNORMAL LOW (ref 39.0–52.0)
Hemoglobin: 12.1 g/dL — ABNORMAL LOW (ref 13.0–17.0)
MCH: 28.9 pg (ref 26.0–34.0)
MCHC: 31.9 g/dL (ref 30.0–36.0)
MCV: 90.7 fL (ref 78.0–100.0)
PLATELETS: 308 10*3/uL (ref 150–400)
RBC: 4.18 MIL/uL — ABNORMAL LOW (ref 4.22–5.81)
RDW: 15 % (ref 11.5–15.5)
WBC: 7.7 10*3/uL (ref 4.0–10.5)

## 2017-08-31 LAB — GLUCOSE, CAPILLARY: GLUCOSE-CAPILLARY: 179 mg/dL — AB (ref 65–99)

## 2017-08-31 MED ORDER — GADOBENATE DIMEGLUMINE 529 MG/ML IV SOLN
10.0000 mL | Freq: Once | INTRAVENOUS | Status: AC
  Administered 2017-08-31: 10 mL via INTRAVENOUS

## 2017-08-31 MED ORDER — PNEUMOCOCCAL VAC POLYVALENT 25 MCG/0.5ML IJ INJ
0.5000 mL | INJECTION | INTRAMUSCULAR | Status: AC
  Administered 2017-09-01: 0.5 mL via INTRAMUSCULAR
  Filled 2017-08-31: qty 0.5

## 2017-08-31 MED ORDER — INFLUENZA VAC SPLIT HIGH-DOSE 0.5 ML IM SUSY
0.5000 mL | PREFILLED_SYRINGE | INTRAMUSCULAR | Status: AC
  Administered 2017-09-01: 0.5 mL via INTRAMUSCULAR
  Filled 2017-08-31: qty 0.5

## 2017-08-31 MED ORDER — SENNOSIDES-DOCUSATE SODIUM 8.6-50 MG PO TABS
1.0000 | ORAL_TABLET | Freq: Every day | ORAL | Status: DC
  Administered 2017-08-31: 1 via ORAL
  Filled 2017-08-31: qty 1

## 2017-08-31 MED ORDER — AMOXICILLIN-POT CLAVULANATE 875-125 MG PO TABS
1.0000 | ORAL_TABLET | Freq: Two times a day (BID) | ORAL | Status: DC
  Administered 2017-08-31 – 2017-09-01 (×2): 1 via ORAL
  Filled 2017-08-31 (×2): qty 1

## 2017-08-31 MED ORDER — FINASTERIDE 5 MG PO TABS
5.0000 mg | ORAL_TABLET | Freq: Every day | ORAL | Status: DC
  Administered 2017-09-01: 5 mg via ORAL
  Filled 2017-08-31: qty 1

## 2017-08-31 MED ORDER — INSULIN ASPART 100 UNIT/ML ~~LOC~~ SOLN
0.0000 [IU] | Freq: Three times a day (TID) | SUBCUTANEOUS | Status: DC
  Administered 2017-09-01: 3 [IU] via SUBCUTANEOUS
  Administered 2017-09-01: 2 [IU] via SUBCUTANEOUS
  Administered 2017-09-01: 3 [IU] via SUBCUTANEOUS

## 2017-08-31 MED ORDER — FUROSEMIDE 40 MG PO TABS
40.0000 mg | ORAL_TABLET | Freq: Every day | ORAL | Status: DC
  Administered 2017-09-01: 40 mg via ORAL
  Filled 2017-08-31: qty 1

## 2017-08-31 MED ORDER — DOXYCYCLINE HYCLATE 100 MG PO TABS
100.0000 mg | ORAL_TABLET | Freq: Two times a day (BID) | ORAL | Status: DC
  Administered 2017-08-31 – 2017-09-01 (×2): 100 mg via ORAL
  Filled 2017-08-31 (×2): qty 1

## 2017-08-31 MED ORDER — PROMETHAZINE HCL 25 MG PO TABS: 12.5000 mg | ORAL_TABLET | Freq: Four times a day (QID) | ORAL | Status: DC | PRN

## 2017-08-31 MED ORDER — CLOPIDOGREL BISULFATE 75 MG PO TABS
75.0000 mg | ORAL_TABLET | Freq: Every day | ORAL | Status: DC
Start: 2017-09-01 — End: 2017-09-01
  Administered 2017-09-01: 75 mg via ORAL
  Filled 2017-08-31: qty 1

## 2017-08-31 MED ORDER — HEPARIN SODIUM (PORCINE) 5000 UNIT/ML IJ SOLN
5000.0000 [IU] | Freq: Three times a day (TID) | INTRAMUSCULAR | Status: DC
  Administered 2017-08-31 – 2017-09-01 (×3): 5000 [IU] via SUBCUTANEOUS
  Filled 2017-08-31 (×3): qty 1

## 2017-08-31 MED ORDER — INSULIN GLARGINE 100 UNIT/ML ~~LOC~~ SOLN
7.0000 [IU] | Freq: Every day | SUBCUTANEOUS | Status: DC
  Administered 2017-08-31: 7 [IU] via SUBCUTANEOUS
  Filled 2017-08-31 (×2): qty 0.07

## 2017-08-31 MED ORDER — FLUDROCORTISONE ACETATE 0.1 MG PO TABS
0.1000 mg | ORAL_TABLET | Freq: Every day | ORAL | Status: DC
  Administered 2017-09-01: 0.1 mg via ORAL
  Filled 2017-08-31: qty 1

## 2017-08-31 MED ORDER — AMLODIPINE BESYLATE 5 MG PO TABS
5.0000 mg | ORAL_TABLET | Freq: Every day | ORAL | Status: DC
  Administered 2017-09-01: 5 mg via ORAL
  Filled 2017-08-31 (×2): qty 1

## 2017-08-31 MED ORDER — IPRATROPIUM BROMIDE 0.02 % IN SOLN: 0.5000 mg | Freq: Three times a day (TID) | RESPIRATORY_TRACT | Status: DC

## 2017-08-31 MED ORDER — ATORVASTATIN CALCIUM 80 MG PO TABS
80.0000 mg | ORAL_TABLET | Freq: Every day | ORAL | Status: DC
Start: 2017-09-01 — End: 2017-09-01
  Administered 2017-09-01: 80 mg via ORAL
  Filled 2017-08-31: qty 2

## 2017-08-31 MED ORDER — TAMSULOSIN HCL 0.4 MG PO CAPS
0.4000 mg | ORAL_CAPSULE | Freq: Every day | ORAL | Status: DC
  Administered 2017-08-31 – 2017-09-01 (×2): 0.4 mg via ORAL
  Filled 2017-08-31 (×2): qty 1

## 2017-08-31 NOTE — ED Notes (Signed)
Patient is stable and ready to be transport to the floor at this time.  Report was called to 85M RN.  Belongings taken with the patient to the floor.

## 2017-08-31 NOTE — ED Notes (Signed)
Pt wheeled back to room from waiting area. Pt assisted to bed and into gown, placed on monitor.

## 2017-08-31 NOTE — ED Notes (Signed)
ED Provider at bedside. 

## 2017-08-31 NOTE — ED Triage Notes (Signed)
Pt was told to get medications last night filled and pt is talking with some difficulty understanding. Pt states he was admitted for his left foot and then discharged yesterday.   No diabetes.  Pt states he has had a stroke in the past.  Pt land lord with him states his voice and motions are completely normal. Landlord states he has not been himself since a week.

## 2017-08-31 NOTE — ED Notes (Signed)
Admitting at bedside 

## 2017-08-31 NOTE — H&P (Signed)
Date: 08/31/2017               Patient Name:  Derrick Hendrix MRN: [id number]  DOB: June 20, 1941 Age / Sex: 76 y.o., male   PCP: [name], MD         Medical Service: Internal Medicine Teaching Service         Attending Physician: Dr. Sid Falcon, MD    First Contact: Dr. Lurlean Horns  Pager: [phone]  Second Contact: Dr. Tiburcio Pea  Pager: [phone]       After Hours (After 5p/  First Contact Pager: [phone]  weekends / holidays): Second Contact Pager: [phone]   Chief Complaint: Abnormal left arm and leg movements   History of Present Illness:  Derrick Hendrix is a 76yo male with history of stroke, CKD, PAD, HTN, hyporeninemic hypoaldosteronism, and newly diagnosed DM2 (A1c 11.1) who presents with abnormal movements of left arm and leg. Patient was recently admitted for L foot abscess that was I&D. States he was at his pharmacy picking up antibiotics when his friend noticed he was slurring his speech and having abnormal movement of left sided limbs. Patient states this is new for him, but on review of chart slurred speech and abnormal limb movements were documented. He has a history of stroke 1-2 years ago, per patient report, and is compliant with medications. No recent changes in medications other than addition of antibiotics for L foot abscess which he has not started taking. States he is feeling overall well and his only complaint is bloating. Last BM yesterday. Denies difficulty ambulating.   ED course: Patient was hemodynamically stable on arrival to the ED. He was seen neurology who recommended a brain MRI due to concern for subthalamic infarct. Lab work unremarkable.   Review of Systems: A complete ROS was negative except as per HPI.   Meds:  Current Meds  Medication Sig  . amLODipine (NORVASC) 5 MG tablet Take 1 tablet (5 mg total) by mouth daily.  [name] atorvastatin (LIPITOR) 80 MG tablet Take 1 tablet (80 mg total) by mouth daily.  . clopidogrel (PLAVIX) 75 MG  tablet TAKE 1 TABLET (75 MG TOTAL) BY MOUTH DAILY.  . finasteride (PROSCAR) 5 MG tablet Take 1 tablet (5 mg total) by mouth daily.  . fludrocortisone (FLORINEF) 0.1 MG tablet Take 1 tablet (0.1 mg total) by mouth daily.  . furosemide (LASIX) 40 MG tablet Take 1 tablet (40 mg total) by mouth daily.  . tamsulosin (FLOMAX) 0.4 MG CAPS capsule TAKE 1 CAPSULE (0.4 MG TOTAL) BY MOUTH DAILY AFTER SUPPER.     Allergies: Allergies as of 08/31/2017 - Review Complete 08/31/2017  Allergen Reaction Noted  . Aspirin Other (See Comments) 08/24/2014   Past Medical History:  Diagnosis Date  . Chronic kidney disease (CKD), stage III (moderate)   . Daily headache    "lately" (04/17/2016)  . Depression   . Heart murmur   . High cholesterol   . Hyperkalemia 08/2014  . Hypertension   . PAD (peripheral artery disease) ([place])   . Stroke ([place]) 03/2016   hx of PAD; j"ust lots of headaches since" (04/17/2016)    Family History:  Family History  Problem Relation Age of Onset  . Hypertension Mother        died age 58  . Diabetes Mother     Social History:  Social History  Substance Use Topics  . Smoking status: Former Smoker    Years: 66.00    Types:  Cigarettes    Start date: 06/23/2017  . Smokeless tobacco: Never Used  . Alcohol use No     Comment: 04/17/2016 "nothing in the last 3-4 years"     Physical Exam: Blood pressure (!) 158/92, pulse 84, temperature (!) 97.2 F (36.2 C), temperature source Oral, resp. rate (!) 26, height 6\' 1"  (1.854 m), weight 204 lb (92.5 kg), SpO2 95 %.  General: pleasant male, well-nourished, well-developed, lying in bed in no acute distress  HENT: NCAT, neck supple and FROM, OP clear without exudates or erythema, poor dentition  Eyes: anicteric sclera, PERRL Cardiac: regular rate and rhythm, nl S1/S2, no murmurs, rubs or gallops  Pulm: CTAB, no wheezes or crackles, no increased work of breathing  Abd: soft, NT, distended, hypoactive bowel sounds  Neuro: A&Ox3, CN  II-XII intact, sensation intact in all four extremities, 5/5 strength in both UE and LE, L dysmetria and disdiadochokinesia noted, no pronator drift noted  Ext: warm and well perfused, no peripheral edema, 2+ DP pulses bilaterally      EKG: personally reviewed my interpretation is normal sinus rhythm at 94 bpm, normal intervals, LVH, no acute signs of ischemia  MRI Brain + Neck: MRA HEAD IMPRESSION:  1. Stable intracranial MRA. No large or proximal arterial branchocclusion. No high-grade or correctable stenosis. 2. Diminutive left vertebral artery terminating in PICA. 3. Scattered intracranial atherosclerotic changes involving the medium and small arterial vessels as above.  MRA NECK IMPRESSION: 1. Motion degraded exam. 2. Carotid arteries patent within the neck without high-grade or critical stenosis. 3. Widely patent dominant right vertebral artery. 4. Diffusely hypoplastic left vertebral artery, not well visualized, but grossly patent within the neck.  Assessment & Plan by Problem:  Derrick Hendrix is a 76yo male with history of stroke, CKD, PAD, HTN, hyporeninemic hypoaldosteronism and newly diagnosed DM2 (A1c 11.1) who presents with abnormal L limb movements concerning for subthalamic CVA.   # Abnormal limb movement: Patient presenting with hemiballismus like movements of L sided limbs. Unclear if this is acute or chronic in nature. DDx: autoimmune, inflammatory disorders, infection, medication-induced, toxic. No signs/sxs of infection. Not on any medications that could induce abnormal limb movement. MRI negative. Neurology following  - TSH, B1, B12 ordered  - UA, UDS, CXR ordered per neuro recs  - Neurology following   # L foot abscess: Patient was recently discharged 9/16 after I&D of L foot abscess. Wound cx with Staph epidermidis and Corynebacterium species. He was discharged on Augmentin and doxycycline, which he has not started.  - Continue augmentin  BID and doxy 100 mg BID     # Hyporeninemic hypoaldosteronism:  - Continue home fludrocortisone 0.1 mg QD   # CKD Stage II: BL Cr 1.5 # HTN  - Fludrocortisone decreased 0.2 --> 0.1 mg QD on previous admission secondary to HTN   # T2DM, newly diagnosed: A1c 11.1. Discharged on Januvia 50 mg QD. Patient was not started on Lantus due to suspicion of medication non-adherence.  - Lantus 7U QHS  - SSI-S   F: None  E: we will monitor and replete as needed N: CM diet   VTE ppx: SQ heparin  Code status: Full code, not confirmed on admission   Dispo: Admit patient to Observation with expected length of stay less than 2 midnights.  Signed: Welford Roche, MD  Internal Medicine PGY-1  P [phone]

## 2017-08-31 NOTE — ED Notes (Signed)
Pt's CBG result was 186. Informed Sabrina - RN.

## 2017-08-31 NOTE — ED Notes (Signed)
Patient taken to MRI

## 2017-08-31 NOTE — Telephone Encounter (Signed)
Patient's neighbor called stating patient is "not acting right". Very anxious & restless with slurred speech. Said patient does not usually have slurred speech. Advised to take patient to ED right away. Verbalized understanding & said will take patient now.

## 2017-08-31 NOTE — Telephone Encounter (Signed)
Pls called pt regarding hosp follow-up

## 2017-08-31 NOTE — ED Notes (Signed)
Admitting tram here to see patient.  Patient remains in MRI at this time.  Admitting team aware and will see patient later.

## 2017-08-31 NOTE — ED Provider Notes (Signed)
[facility] DEPT Provider Note   CSN: [id number] Arrival date & time: 08/31/17  1115     History   Chief Complaint Chief Complaint  Patient presents with  . Altered Mental Status   HPI  Derrick Hendrix is a 76yo male with PMH of stroke, CKD, PAD, HTN, and newly diagnosed DM2 (A1c 11.1) who presents with abnormal movements. He was just discharged yesterday after left foot abscess that was drained. He was discharged with prescriptions sent to [facility] for augmentin and doxycycline. He went to go pick them up this morning with his neighbor but for some reason, he was not in their system. He became more upset at this and started becoming very jittery (LUE>RUE). He did feel a little upset upon waking up this morning and felt a little jittery but he states it became worse after finding out that he couldn't pick up his medications. He denies chest pain, shortness of breath, fevers, abdominal pain, dysuria, changes in BMs, or leg swelling. No recent travel, hiking in the woods, or rashes. He states that this has happened to him before where he gets upset/anxious and becomes very jittery. He does not take any medication for this, but says that he just tries to calm himself down. Neighbor states that his speech is slurred (also noticed this last week) and that he is very talkative now, which is abnormal for him. Patient does not feel his speech or thinking are any different.  Denies smoking, alcohol use, or other illicit drugs. Lives at home alone with cats. States he did take all of his other medications as prescribed.  Past Medical History:  Diagnosis Date  . Chronic kidney disease (CKD), stage III (moderate)   . Daily headache    "lately" (04/17/2016)  . Depression   . Heart murmur   . High cholesterol   . Hyperkalemia 08/2014  . Hypertension   . PAD (peripheral artery disease) ([place])   . Stroke [facility]) 03/2016   hx of PAD; j"ust lots of headaches since" (04/17/2016)    Patient Active Problem List   Diagnosis Date Noted  . Abscess of foot without toes, left 08/28/2017  . Preop cardiovascular exam   . Osteomyelitis ([place])   . Ischemic pain of foot, right   . Paronychia of great toe, right   . Severe peripheral arterial disease ([place]) 02/27/2017  . Right foot pain 02/09/2017  . Hyperlipemia 11/11/2016  . Cerebellar infarct ([place]) 05/06/2016  . Benign prostatic hyperplasia with urinary obstruction   . Orthostatic hypotension 04/04/2016  . Urinary retention 03/20/2016  . Cerebrovascular accident (CVA) due to occlusion of left posterior cerebral artery ([place])   . Dizziness 03/07/2016  . Neuropathic pain 02/06/2015  . Constipation 11/07/2014  . Essential hypertension   . Hyperkalemia 10/20/2014  . Normocytic anemia 10/20/2014  . Hyporeninemic hypoaldosteronism ([place]) 08/24/2014  . Healthcare maintenance 07/13/2014  . Atherosclerosis of leg with intermittent claudication, R 06/29/2014  . Weight loss, unintentional 06/27/2014  . Current smoker 06/27/2014  . Alcohol use ([place]) 06/27/2014  . Stable angina ([place]) 06/27/2014  . Vitamin B12 deficiency 06/27/2014  . CKD (chronic kidney disease), stage III 06/26/2014  . Headache 06/26/2014    Past Surgical History:  Procedure Laterality Date  . ABDOMINAL AORTOGRAM W/LOWER EXTREMITY N/A 03/02/2017   Procedure: Abdominal Aortogram w/Lower Extremity;  Surgeon: Conrad Edgerton, MD;  Location: [facility];  Service: Cardiovascular;  Laterality: N/A;  . EP IMPLANTABLE DEVICE N/A 03/04/2016   Procedure: Loop Recorder Insertion;  Surgeon: Thompson Grayer, MD;  Location: [facility];  Service: Cardiovascular;  Laterality: N/A;  . PERIPHERAL VASCULAR INTERVENTION Right 03/06/2017   Procedure: Peripheral Vascular Intervention;  Surgeon: Elam Dutch, MD;  Location: [facility];  Service: Cardiovascular;  Laterality: Right;  SFA       Home Medications    Prior to Admission medications   Medication Sig Start Date End Date Taking?  Authorizing Provider  amLODipine (NORVASC) 5 MG tablet Take 1 tablet (5 mg total) by mouth daily. 06/12/17 06/12/18 Yes Rice, Resa Miner, MD  atorvastatin (LIPITOR) 80 MG tablet Take 1 tablet (80 mg total) by mouth daily. 11/10/16  Yes Rice, Resa Miner, MD  clopidogrel (PLAVIX) 75 MG tablet TAKE 1 TABLET (75 MG TOTAL) BY MOUTH DAILY. 07/02/17  Yes Rice, Resa Miner, MD  finasteride (PROSCAR) 5 MG tablet Take 1 tablet (5 mg total) by mouth daily. 11/10/16  Yes Rice, Resa Miner, MD  fludrocortisone (FLORINEF) 0.1 MG tablet Take 1 tablet (0.1 mg total) by mouth daily. 08/31/17  Yes Lorella Nimrod, MD  furosemide (LASIX) 40 MG tablet Take 1 tablet (40 mg total) by mouth daily. 05/27/17 05/27/18 Yes Rice, Resa Miner, MD  tamsulosin (FLOMAX) 0.4 MG CAPS capsule TAKE 1 CAPSULE (0.4 MG TOTAL) BY MOUTH DAILY AFTER SUPPER. 07/02/17  Yes Rice, Resa Miner, MD  acetaminophen (TYLENOL) 500 MG tablet Take 2 tablets (1,000 mg total) by mouth every 6 (six) hours as needed for mild pain (or Fever >/= 101). 04/05/16   Loleta Chance, MD  amoxicillin-clavulanate (AUGMENTIN) 875-125 MG tablet Take 1 tablet by mouth 2 (two) times daily. 08/30/17 09/09/17  Lorella Nimrod, MD  doxycycline (VIBRA-TABS) 100 MG tablet Take 1 tablet (100 mg total) by mouth every 12 (twelve) hours. 08/30/17 09/09/17  Lorella Nimrod, MD  ergocalciferol (VITAMIN D2) 50000 units capsule Take 50,000 Units by mouth once a week.    [provider]  HYDROcodone-acetaminophen (NORCO/VICODIN) 5-325 MG tablet Take 1 tablet by mouth every 6 (six) hours as needed for moderate pain. 08/30/17   Lorella Nimrod, MD  ipratropium (ATROVENT) 0.06 % nasal spray Place 2 sprays into the nose 3 (three) times daily. Patient taking differently: Place 2 sprays into the nose daily as needed for rhinitis.  02/06/15   Luan Moore, MD  linagliptin (TRADJENTA) 5 MG TABS tablet Take 1 tablet (5 mg total) by mouth daily. 08/30/17   Lorella Nimrod, MD  mupirocin ointment  (BACTROBAN) 2 % Apply 1 application topically 2 (two) times daily. 05/25/17   Edrick Kins, DPM  nitroGLYCERIN (NITROSTAT) 0.4 MG SL tablet Place 1 tablet (0.4 mg total) under the tongue every 5 (five) minutes x 3 doses as needed for chest pain. 11/29/14   Luan Moore, MD  polyethylene glycol [facility]) packet Take 17 g by mouth daily as needed (constipation). 06/12/17   [name], MD    Family History Family History  Problem Relation Age of Onset  . Hypertension Mother        died age 1  . Diabetes Mother     Social History Social History  Substance Use Topics  . Smoking status: Former Smoker    Years: 66.00    Types: Cigarettes    Start date: 06/23/2017  . Smokeless tobacco: Never Used  . Alcohol use No     Comment: 04/17/2016 "nothing in the last 3-4 years"     Allergies   Aspirin   Review of Systems Review of Systems  Unremarkable  except as stated above in HPI.  Physical Exam Updated Vital Signs BP (!) 156/86   Pulse 83   Temp (!) 97.2 F (36.2 C) (Oral)   Resp (!) 26   Ht 6\' 1"  (1.854 m)   Wt 92.5 kg (204 lb)   SpO2 94%   BMI 26.91 kg/m   Physical Exam  GEN: Lying in bed in NAD. Constantly moving upper extremities throughout my interview and exam, LUE > RUE. Alert and oriented to person, place, and president (thought year was 2020). HENT: Moist mucous membranes. No visible lesions. EYES: EOMI. PERRL. Sclera anicteric. RESP: Clear to auscultation bilaterally. No wheezes, rales, or rhonchi. No increased work of breathing. CV: Normal rate and regular rhythm. No murmurs, gallops, or rubs. No LE edema. ABD: Soft. Non-tender. Distended. Normoactive bowel sounds. EXT: No edema. Warm. Left foot appears improved compared to picture from recent admission. NEURO: Decreased facial sensation on R side (states this is chronic from his prior stroke). Other cranial nerves intact. 5/5 strength in BUE and BLE. Normal sensation in extremities. No asterixis.  Choreiform vs hemiballistic movements in LUE > RUE and some in lower extremities as well. Cranial nerves II-XII grossly intact. Neighbor states that he is talking more than usual and that his speech is slurred.  ED Treatments / Results  Labs (all labs ordered are listed, but only abnormal results are displayed) Labs Reviewed  COMPREHENSIVE METABOLIC PANEL - Abnormal; Notable for the following:       Result Value   Glucose, Bld 186 (*)    BUN 26 (*)    Creatinine, Ser 1.82 (*)    Calcium 8.8 (*)    Albumin 3.4 (*)    GFR calc non Af Amer 34 (*)    GFR calc Af Amer 39 (*)    All other components within normal limits  CBC - Abnormal; Notable for the following:    RBC 4.18 (*)    Hemoglobin 12.1 (*)    HCT 37.9 (*)    All other components within normal limits  CBG MONITORING, ED - Abnormal; Notable for the following:    Glucose-Capillary 186 (*)    All other components within normal limits    EKG  EKG Interpretation  Date/Time:  Monday August 31 2017 11:57:06 EDT Ventricular Rate:  94 PR Interval:  116 QRS Duration: 100 QT Interval:  362 QTC Calculation: 452 R Axis:   54 Text Interpretation:  Normal sinus rhythm Moderate voltage criteria for LVH, may be normal variant Nonspecific T wave abnormality No significant change since last tracing Abnormal ekg Confirmed by Carmin Muskrat [phone]) on 08/31/2017 12:01:13 PM       Radiology No results found.  Procedures Procedures (including critical care time)  Medications Ordered in ED Medications - No data to display   Initial Impression / Assessment and Plan / ED Course  I have reviewed the triage vital signs and the nursing notes.  Pertinent labs & imaging results that were available during my care of the patient were reviewed by me and considered in my medical decision making (see chart for details).  Derrick Hendrix is a 76yo male with PMH of stroke, CKD, PAD, HTN, hyporeninemic hypoaldosteronism, and newly diagnosed TM2  (A1c 11.1) who presents with abnormal movements (?choreiform vs hemiballistic). Neighbor states patient has had slurred speech for the last week. Cr elevated to 1.82 (baseline ~1.5), otherwise CBC, CMP, and CBG wnl. Will ask neurology to evaluate for abnormal movements.  3:10pm Evaluated by neurology,  who feel that the movements are definitely hemiballistic/choreiform in nature. They suspect that this is either a recrudescence of his prior stroke or a new stroke. Ordered MRI/A brain for further work-up and recommended admission. Spoke to IMTS for admission.  Patient seen and discussed with Dr. Laverta Baltimore.  Final Clinical Impressions(s) / ED Diagnoses   Final diagnoses:  None    New Prescriptions New Prescriptions   No medications on file     Colbert Ewing, MD 08/31/17 1513    Margette Fast, MD 08/31/17 [phone]

## 2017-08-31 NOTE — ED Notes (Signed)
Neurologist at bedside. 

## 2017-08-31 NOTE — Consult Note (Addendum)
NEURO HOSPITALIST CONSULT NOTE   Requestig physician: Dr. Laverta Baltimore  Reason for Consult: Abnormal left arm and leg movements.   History obtained from:  Patient     HPI:                                                                                                                                          Derrick Hendrix is an 76 y.o. male with a past medical history of chronic kidney disease, hypercholesterolemia, old stroke with no residual deficits, presenting to the hospital today after noticing this morning that he had abnormal movements with his left arm and leg.these movements have been ongoingfor at least a day but worsened this . Patient has no control over these movements. When talking to the patient is obvious that he has flailing movements of his left arm and intermittent flailing movements of his left leg. These movements are not rhythmic but asynchrony not it. They also do not follow the same pattern. They very much look like hemiballism. Patient has no other complaints at this time other than the fact that these very thirsty. Neurology was asked to evaluate the patient to further evaluate his movements.  He was discharged yesterday from [facility], for left foot abscess and discharged home on doxycycline and Augmentin. He is also a newly diagnosed diabetic.  Past Medical History:  Diagnosis Date  . Chronic kidney disease (CKD), stage III (moderate)   . Daily headache    "lately" (04/17/2016)  . Depression   . Heart murmur   . High cholesterol   . Hyperkalemia 08/2014  . Hypertension   . PAD (peripheral artery disease) ([place])   . Stroke ([place]) 03/2016   hx of PAD; j"ust lots of headaches since" (04/17/2016)    Past Surgical History:  Procedure Laterality Date  . ABDOMINAL AORTOGRAM W/LOWER EXTREMITY N/A 03/02/2017   Procedure: Abdominal Aortogram w/Lower Extremity;  Surgeon: Conrad Willard, MD;  Location: [place] [facility];  Service: Cardiovascular;   Laterality: N/A;  . EP IMPLANTABLE DEVICE N/A 03/04/2016   Procedure: Loop Recorder Insertion;  Surgeon: Thompson Grayer, MD;  Location: [place] [facility];  Service: Cardiovascular;  Laterality: N/A;  . PERIPHERAL VASCULAR INTERVENTION Right 03/06/2017   Procedure: Peripheral Vascular Intervention;  Surgeon: Elam Dutch, MD;  Location: [place] [facility];  Service: Cardiovascular;  Laterality: Right;  SFA    Family History  Problem Relation Age of Onset  . Hypertension Mother        died age 50  . Diabetes Mother    Social History:  reports that he has quit smoking. His smoking use included Cigarettes. He started smoking about 2 months ago. He quit after 66.00 years of use. He has never used smokeless tobacco. He reports  that he does not drink alcohol or use drugs.  Allergies  Allergen Reactions  . Aspirin Other (See Comments)    Nosebleeds     MEDICATIONS:                                                                                                                     No current facility-administered medications for this encounter.    Current Outpatient Prescriptions  Medication Sig Dispense Refill  . acetaminophen (TYLENOL) 500 MG tablet Take 2 tablets (1,000 mg total) by mouth every 6 (six) hours as needed for mild pain (or Fever >/= 101). 30 tablet 0  . amLODipine (NORVASC) 5 MG tablet Take 1 tablet (5 mg total) by mouth daily. 30 tablet 11  . amoxicillin-clavulanate (AUGMENTIN) 875-125 MG tablet Take 1 tablet by mouth 2 (two) times daily. 20 tablet 0  . atorvastatin (LIPITOR) 80 MG tablet Take 1 tablet (80 mg total) by mouth daily. 90 tablet 1  . clopidogrel (PLAVIX) 75 MG tablet TAKE 1 TABLET (75 MG TOTAL) BY MOUTH DAILY. 90 tablet 3  . doxycycline (VIBRA-TABS) 100 MG tablet Take 1 tablet (100 mg total) by mouth every 12 (twelve) hours. 20 tablet 0  . ergocalciferol (VITAMIN D2) 50000 units capsule Take 50,000 Units by mouth once a week.    . finasteride (PROSCAR) 5 MG  tablet Take 1 tablet (5 mg total) by mouth daily. 90 tablet 1  . fludrocortisone (FLORINEF) 0.1 MG tablet Take 1 tablet (0.1 mg total) by mouth daily. 30 tablet 0  . furosemide (LASIX) 40 MG tablet Take 1 tablet (40 mg total) by mouth daily. 90 tablet 3  . HYDROcodone-acetaminophen (NORCO/VICODIN) 5-325 MG tablet Take 1 tablet by mouth every 6 (six) hours as needed for moderate pain. 20 tablet 0  . ipratropium (ATROVENT) 0.06 % nasal spray Place 2 sprays into the nose 3 (three) times daily. (Patient taking differently: Place 2 sprays into the nose daily as needed for rhinitis. ) 15 mL 2  . linagliptin (TRADJENTA) 5 MG TABS tablet Take 1 tablet (5 mg total) by mouth daily. 30 tablet 2  . mupirocin ointment (BACTROBAN) 2 % Apply 1 application topically 2 (two) times daily. 22 g 0  . nitroGLYCERIN (NITROSTAT) 0.4 MG SL tablet Place 1 tablet (0.4 mg total) under the tongue every 5 (five) minutes x 3 doses as needed for chest pain. 30 tablet 3  . polyethylene glycol (MIRALAX) packet Take 17 g by mouth daily as needed (constipation). 14 each 0  . tamsulosin (FLOMAX) 0.4 MG CAPS capsule TAKE 1 CAPSULE (0.4 MG TOTAL) BY MOUTH DAILY AFTER SUPPER. 90 capsule 3      ROS:  History obtained from the patient  General ROS: negative for - chills, fatigue, fever, night sweats, weight gain or weight loss Psychological ROS: negative for - behavioral disorder, hallucinations, memory difficulties, mood swings or suicidal ideation Ophthalmic ROS: negative for - blurry vision, double vision, eye pain or loss of vision ENT ROS: negative for - epistaxis, nasal discharge, oral lesions, sore throat, tinnitus or vertigo Allergy and Immunology ROS: negative for - hives or itchy/watery eyes Hematological and Lymphatic ROS: negative for - bleeding problems, bruising or swollen lymph  nodes Endocrine ROS: negative for - galactorrhea, hair pattern changes, polydipsia/polyuria or temperature intolerance Respiratory ROS: negative for - cough, hemoptysis, shortness of breath or wheezing Cardiovascular ROS: negative for - chest pain, dyspnea on exertion, edema or irregular heartbeat Gastrointestinal ROS: negative for - abdominal pain, diarrhea, hematemesis, nausea/vomiting or stool incontinence Genito-Urinary ROS: negative for - dysuria, hematuria, incontinence or urinary frequency/urgency Musculoskeletal ROS: negative for - joint swelling or muscular weakness Neurological ROS: as noted in HPI Dermatological ROS: negative for rash and skin lesion changes   Blood pressure (!) 141/83, pulse 82, temperature (!) 97.2 F (36.2 C), temperature source Oral, resp. rate (!) 26, height 6\' 1"  (1.854 m), weight 92.5 kg (204 lb), SpO2 96 %.   Neurologic Examination:                                                                                                      HEENT-  Normocephalic, no lesions, without obvious abnormality.  Normal external eye and conjunctiva.  Normal TM's bilaterally.  Normal auditory canals and external ears. Normal external nose, mucus membranes and septum.  Normal pharynx. Cardiovascular- S1, S2 normal, pulses palpable throughout   Lungs- chest clear, no wheezing, rales, normal symmetric air entry Abdomen- normal findings: bowel sounds normal Extremities- no edema Lymph-no adenopathy palpable Musculoskeletal-no joint tenderness, deformity or swelling Skin-warm and dry, no hyperpigmentation, vitiligo, or suspicious lesions  Neurological Examination Mental Status: Alert, oriented, thought content appropriate.  Speech fluent without evidence of aphasia.  Able to follow 3 step commands without difficulty. Cranial Nerves: II: Visual fields grossly normal,  III,IV, VI: ptosis not present, extra-ocular motions intact bilaterally pupils equal, round, reactive to  light and accommodation V,VII: smile symmetric, facial light touch sensation normal bilaterally VIII: hearing normal bilaterally IX,X: uvula rises symmetrically XI: bilateral shoulder shrug XII: midline tongue extension Motor: Right : Upper extremity   5/5    Left:     Upper extremity   5/5  Lower extremity   5/5     Lower extremity   5/5 --Notable nonrhythmic intermittent nonsynchronized movements of left leg and arm very much like hemiballism. Tone and bulk:normal tone throughout; no atrophy noted Sensory: Pinprick and light touch intact throughout, bilaterally Deep Tendon Reflexes: 1+ and symmetric throughout Plantars: Right: downgoing   Left: downgoing Cerebellar: Normal finger-nose on the right normal heel-to-shin on the right. Difficult to assess finger-nose and heel-to-shin on the left secondary to abnormal movements Gait: Not tested   Lab Results: Basic Metabolic Panel:  Recent Labs Lab 08/28/17 1209 08/28/17 2036 08/29/17  8466 08/30/17 0218 08/31/17 1234  NA 130*  --  136 139 139  K 4.1  --  3.5 3.9 4.1  CL 96*  --  107 110 107  CO2 22  --  22 21* 24  GLUCOSE 517*  --  205* 138* 186*  BUN 43*  --  37* 31* 26*  CREATININE 2.46* 2.02* 1.98* 1.71* 1.82*  CALCIUM 8.9  --  8.3* 8.1* 8.8*    Liver Function Tests:  Recent Labs Lab 08/31/17 1234  AST 32  ALT 19  ALKPHOS 94  BILITOT 1.0  PROT 7.3  ALBUMIN 3.4*   No results for input(s): LIPASE, AMYLASE in the last 168 hours. No results for input(s): AMMONIA in the last 168 hours.  CBC:  Recent Labs Lab 08/28/17 1209 08/28/17 2036 08/29/17 0323 08/31/17 1234  WBC 11.7* 12.4* 8.1 7.7  NEUTROABS 9.2*  --   --   --   HGB 13.4 12.8* 11.6* 12.1*  HCT 41.7 37.4* 36.5* 37.9*  MCV 88.9 88.6 90.3 90.7  PLT 266 276 249 308    Cardiac Enzymes: No results for input(s): CKTOTAL, CKMB, CKMBINDEX, TROPONINI in the last 168 hours.  Lipid Panel: No results for input(s): CHOL, TRIG, HDL, CHOLHDL, VLDL, LDLCALC  in the last 168 hours.  CBG:  Recent Labs Lab 08/29/17 1643 08/29/17 2027 08/30/17 0541 08/30/17 1151 08/31/17 1153  GLUCAP 195* 112* 133* 143* 186*    Microbiology: Results for orders placed or performed during the hospital encounter of 08/28/17  Wound or Superficial Culture     Status: None (Preliminary result)   Collection Time: 08/28/17  5:02 PM  Result Value Ref Range Status   Specimen Description FOOT LEFT  Final   Special Requests Normal  Final   Gram Stain   Final    MODERATE WBC PRESENT, PREDOMINANTLY PMN ABUNDANT GRAM POSITIVE COCCI IN CLUSTERS RARE GRAM NEGATIVE RODS    Culture   Final    FEW STAPHYLOCOCCUS EPIDERMIDIS FEW CORYNEBACTERIUM SPECIES    Report Status PENDING  Incomplete    Coagulation Studies: No results for input(s): LABPROT, INR in the last 72 hours.  Imaging: No results found.  Assessment and plan per attending neurologist  Etta Quill PA-C [facility] [phone]  08/31/2017, 2:40 PM  Attending addendum Patient seen and examined. Agree with the history and physical as above. I have made the necessary edits in the note including that in the examination section above. My assessment and conditions are below  Assessment/Plan: This is a 76 year old male presenting to the hospital with hemiballismus-like movements of the left arm and leg. At this point concern for possible right subthalamic or caudate infarct versus recrudescence of old stroke, which had affected his left side.  Patient is on no medications that can induce abnormal movements/except tremor of symptoms.  Impression Abnormal left arm and leg movements-hemi-ballistic Evaluate for stroke Evaluate for toxic metabolic derangements Evaluate for stroke recrudescence  Recs -Recommend MRI of brain to evaluate for intracranial etiology. -Check UA, CXR -U. tox  Further recs based on clinical course, imaging and lab studies.  -- Amie Portland, MD [facility] [phone]  If 7pm to 7am, please call on call as listed on AMION.

## 2017-09-01 ENCOUNTER — Telehealth: Payer: Self-pay

## 2017-09-01 DIAGNOSIS — Z79899 Other long term (current) drug therapy: Secondary | ICD-10-CM | POA: Diagnosis not present

## 2017-09-01 DIAGNOSIS — L02612 Cutaneous abscess of left foot: Secondary | ICD-10-CM

## 2017-09-01 DIAGNOSIS — Z886 Allergy status to analgesic agent status: Secondary | ICD-10-CM | POA: Diagnosis not present

## 2017-09-01 DIAGNOSIS — N183 Chronic kidney disease, stage 3 (moderate): Secondary | ICD-10-CM | POA: Diagnosis not present

## 2017-09-01 DIAGNOSIS — E2749 Other adrenocortical insufficiency: Secondary | ICD-10-CM

## 2017-09-01 DIAGNOSIS — G255 Other chorea: Secondary | ICD-10-CM | POA: Diagnosis not present

## 2017-09-01 LAB — BASIC METABOLIC PANEL
Anion gap: 4 — ABNORMAL LOW (ref 5–15)
BUN: 25 mg/dL — ABNORMAL HIGH (ref 6–20)
CALCIUM: 8.6 mg/dL — AB (ref 8.9–10.3)
CO2: 26 mmol/L (ref 22–32)
CREATININE: 1.6 mg/dL — AB (ref 0.61–1.24)
Chloride: 111 mmol/L (ref 101–111)
GFR calc Af Amer: 46 mL/min — ABNORMAL LOW (ref >60)
GFR, EST NON AFRICAN AMERICAN: 40 mL/min — AB (ref >60)
Glucose, Bld: 122 mg/dL — ABNORMAL HIGH (ref 65–99)
POTASSIUM: 3.7 mmol/L (ref 3.5–5.1)
Sodium: 141 mmol/L (ref 135–145)

## 2017-09-01 LAB — CBC
HCT: 36.7 % — ABNORMAL LOW (ref 39.0–52.0)
HEMOGLOBIN: 11.8 g/dL — AB (ref 13.0–17.0)
MCH: 29 pg (ref 26.0–34.0)
MCHC: 32.2 g/dL (ref 30.0–36.0)
MCV: 90.2 fL (ref 78.0–100.0)
Platelets: 308 10*3/uL (ref 150–400)
RBC: 4.07 MIL/uL — AB (ref 4.22–5.81)
RDW: 14.7 % (ref 11.5–15.5)
WBC: 9 10*3/uL (ref 4.0–10.5)

## 2017-09-01 LAB — AEROBIC CULTURE  (SUPERFICIAL SPECIMEN)

## 2017-09-01 LAB — AEROBIC CULTURE W GRAM STAIN (SUPERFICIAL SPECIMEN): Special Requests: NORMAL

## 2017-09-01 LAB — URINALYSIS, ROUTINE W REFLEX MICROSCOPIC
Bilirubin Urine: NEGATIVE
GLUCOSE, UA: 150 mg/dL — AB
Ketones, ur: NEGATIVE mg/dL
LEUKOCYTES UA: NEGATIVE
NITRITE: NEGATIVE
PROTEIN: 100 mg/dL — AB
SPECIFIC GRAVITY, URINE: 1.012 (ref 1.005–1.030)
Squamous Epithelial / LPF: NONE SEEN
pH: 5 (ref 5.0–8.0)

## 2017-09-01 LAB — VITAMIN B12: VITAMIN B 12: 303 pg/mL (ref 180–914)

## 2017-09-01 LAB — GLUCOSE, CAPILLARY
GLUCOSE-CAPILLARY: 195 mg/dL — AB (ref 65–99)
Glucose-Capillary: 177 mg/dL — ABNORMAL HIGH (ref 65–99)

## 2017-09-01 LAB — TSH: TSH: 1.331 u[IU]/mL (ref 0.350–4.500)

## 2017-09-01 LAB — SEDIMENTATION RATE: SED RATE: 48 mm/h — AB (ref 0–16)

## 2017-09-01 MED ORDER — DOXYCYCLINE HYCLATE 100 MG PO TABS: 100.0000 mg | ORAL_TABLET | Freq: Two times a day (BID) | ORAL | 0 refills | Status: AC

## 2017-09-01 MED ORDER — AMOXICILLIN-POT CLAVULANATE 875-125 MG PO TABS: 1.0000 | ORAL_TABLET | Freq: Two times a day (BID) | ORAL | 0 refills | Status: AC

## 2017-09-01 MED ORDER — LINAGLIPTIN 5 MG PO TABS: 5.0000 mg | ORAL_TABLET | Freq: Every day | ORAL | 2 refills | Status: DC

## 2017-09-01 MED ORDER — CLONAZEPAM 0.5 MG PO TABS: 0.2500 mg | ORAL_TABLET | Freq: Three times a day (TID) | ORAL | 0 refills | Status: DC

## 2017-09-01 MED ORDER — CLONAZEPAM 0.5 MG PO TABS
0.2500 mg | ORAL_TABLET | Freq: Three times a day (TID) | ORAL | Status: DC
  Administered 2017-09-01 (×2): 0.25 mg via ORAL
  Filled 2017-09-01 (×2): qty 1

## 2017-09-01 NOTE — Care Management Note (Signed)
Case Management Note  Patient Details  Name: Aveon Colquhoun MRN: [id number] Date of Birth: 1941/08/30  Subjective/Objective:    Pt in with hemiballismus. He is from home with friends.                 Action/Plan: Awaiting PT/OT recommendations. CM following for d/c needs, physician orders.   Expected Discharge Date:                  Expected Discharge Plan:     In-House Referral:     Discharge planning Services     Post Acute Care Choice:    Choice offered to:     DME Arranged:    DME Agency:     HH Arranged:    HH Agency:     Status of Service:  In process, will continue to follow  If discussed at Long Length of Stay Meetings, dates discussed:    Additional Comments:  Pollie Friar, RN 09/01/2017, 11:08 AM

## 2017-09-01 NOTE — Care Management Note (Signed)
Case Management Note  Patient Details  Name: Jamorris Ndiaye MRN: [id number] Date of Birth: September 30, 1941  Subjective/Objective:                    Action/Plan: Pt discharging home with self care. Pt uses [facility] for his medications. They deliver his medications to his home. CM asked about a late delivery today and they are not able to deliver after 5 pm . CM asked that patients medications be delivered first thing in am. CM notified MD and plan is for patient to get his pm medications prior to d/c and then his medications for tomorrow will be delivered in the am. Patient notified and in agreement. CM asked the patient to call the pharmacy early am to make sure medications were being delivered. He agreed.  Pt without transportation home. CM provided the bedside RN a cab voucher for the patient when he is ready for d/c.   Expected Discharge Date:                  Expected Discharge Plan:  Home/Self Care  In-House Referral:     Discharge planning Services  CM Consult  Post Acute Care Choice:    Choice offered to:     DME Arranged:    DME Agency:     HH Arranged:    [place] Agency:     Status of Service:  Completed, signed off  If discussed at [employer] of Stay Meetings, dates discussed:    Additional Comments:  Pollie Friar, RN 09/01/2017, 4:13 PM

## 2017-09-01 NOTE — Discharge Summary (Signed)
Name: Derrick Hendrix MRN: [id number] DOB: Feb 09, 1941 76 y.o. PCP: Derrick [place]ina, MD  Date of Admission: 08/31/2017 12:08 PM Date of Discharge: 09/03/2017 Attending Physician: No att. providers found  Discharge Diagnosis: Active Problems:   CKD (chronic kidney disease), stage III   Hyporeninemic hypoaldosteronism (HCC)   Abscess of foot without toes, left   Hemiballismus   Discharge Medications: Allergies as of 09/01/2017      Reactions   Aspirin Other (See Comments)   Nosebleeds       Medication List    STOP taking these medications   ergocalciferol 50000 units capsule Commonly known as:  VITAMIN D2   finasteride 5 MG tablet Commonly known as:  PROSCAR   HYDROcodone-acetaminophen 5-325 MG tablet Commonly known as:  NORCO/VICODIN   polyethylene glycol packet Commonly known as:  MIRALAX     TAKE these medications   acetaminophen 500 MG tablet Commonly known as:  TYLENOL Take 2 tablets (1,000 mg total) by mouth every 6 (six) hours as needed for mild pain (or Fever >/= 101).   amLODipine 5 MG tablet Commonly known as:  NORVASC Take 1 tablet (5 mg total) by mouth daily.   amoxicillin-clavulanate 875-125 MG tablet Commonly known as:  AUGMENTIN Take 1 tablet by mouth 2 (two) times daily.   atorvastatin 80 MG tablet Commonly known as:  LIPITOR Take 1 tablet (80 mg total) by mouth daily.   clonazePAM 0.5 MG tablet Commonly known as:  KLONOPIN Take 0.5 tablets (0.25 mg total) by mouth 3 (three) times daily.   clopidogrel 75 MG tablet Commonly known as:  PLAVIX TAKE 1 TABLET (75 MG TOTAL) BY MOUTH DAILY.   doxycycline 100 MG tablet Commonly known as:  VIBRA-TABS Take 1 tablet (100 mg total) by mouth every 12 (twelve) hours.   fludrocortisone 0.1 MG tablet Commonly known as:  FLORINEF Take 1 tablet (0.1 mg total) by mouth daily.   furosemide 40 MG tablet Commonly known as:  LASIX Take 1 tablet (40 mg total) by mouth daily.   ipratropium 0.06 %  nasal spray Commonly known as:  ATROVENT Place 2 sprays into the nose 3 (three) times daily. What changed:  when to take this  reasons to take this   linagliptin 5 MG Tabs tablet Commonly known as:  TRADJENTA Take 1 tablet (5 mg total) by mouth daily.   mupirocin ointment 2 % Commonly known as:  BACTROBAN Apply 1 application topically 2 (two) times daily.   nitroGLYCERIN 0.4 MG SL tablet Commonly known as:  NITROSTAT Place 1 tablet (0.4 mg total) under the tongue every 5 (five) minutes x 3 doses as needed for chest pain.   tamsulosin 0.4 MG Caps capsule Commonly known as:  FLOMAX TAKE 1 CAPSULE (0.4 MG TOTAL) BY MOUTH DAILY AFTER SUPPER.            Discharge Care Instructions        Start     Ordered   09/01/17 0000  amoxicillin-clavulanate (AUGMENTIN) 875-125 MG tablet  2 times daily     09/01/17 1614   09/01/17 0000  clonazePAM (KLONOPIN) 0.5 MG tablet  3 times daily     09/01/17 1614   09/01/17 0000  doxycycline (VIBRA-TABS) 100 MG tablet  Every 12 hours     09/01/17 1614   09/01/17 0000  linagliptin (TRADJENTA) 5 MG TABS tablet  Daily     09/01/17 1614   09/01/17 0000  Increase activity slowly     09/01/17 1614  09/01/17 0000  Diet - low sodium heart healthy     09/01/17 1614   09/01/17 0000  Call MD for:  redness, tenderness, or signs of infection (pain, swelling, redness, odor or green/yellow discharge around incision site)     09/01/17 1614   09/01/17 0000  Call MD for:  difficulty breathing, headache or visual disturbances     09/01/17 1614   09/01/17 0000  Call MD for:  persistant dizziness or light-headedness     09/01/17 1614      Disposition and follow-up:   DerrickDerrick Hendrix was discharged from Derrick Hendrix in Stable condition.  At the hospital follow up visit please address:  1.  A. Please address the patients hypertension, potassium and fludrocortisone levels      B. Please address the patients hemiballismus and clonazepam  dose.      C. Please address the patients foot abscess      D. Please address the patients urinary function and or creatinine if indicated  2.  Labs / imaging needed at time of follow-up: BMP  3.  Pending labs/ test needing follow-up: n/a  Follow-up Appointments:   Hospital Course by problem list: Active Problems:   CKD (chronic kidney disease), stage III   Hyporeninemic hypoaldosteronism (HCC)   Abscess of foot without toes, left   Hemiballismus   1. Hemiballismus: The patient stated that on multiple occasions he has encountered similar movements of his foot and/or left arm secondary to anxiety, or irritation. He states that these symptoms typically resolve on her own but objectively more pronounced on this admission. The patient's hemiballismus was most likely the result of latent stroke recrudescence as per neurology. MRI of the brain was unremarkable for acute process, however, it was remarkable for multiple chronic processes, see below. As a result he was treated with, clonazepam 0.25 mg 3 times daily. The initial dose appeared to be mildly improve the patient's symptoms as reported by the patient. The patient was discharged home with clonazepam with close follow-up with his PCP and reevaluation. If at that time the patient's symptoms have not improved significant family, Keppra could be considered.   2. Abscess of foot the plantar aspect of the foot: Derrick Hendrix was admitted previously for tenderness and swelling of the plantar surface of his left foot which was first observed a week prior to his second admission. He was diagnosed with cellulitis at that time, I&D was performed in the ED by the ED physician, and he was admitted for antibiotics. Cultures were taken which indicated predominant PMNs, gram-positive cocci in clusters and gram-negative rods. Patient was discharged on doxycycline 100 mg twice a day and Augmentin 1 tablet twice a day. Patient remained afebrile with his heart rate  within normal limits. The patient stated that upon discharge he was unable to obtain the recommended medications at indicated pharmacy. The patient failed to realize that he been sent to an alternative pharmacy as his pharmacy was closed on Sunday. The patient became irate and not being able to take his medications increasing the irregular movement that he displays at baseline. This movement only worsened over time developing into hemiballismus as seen above.  3. Hyporeninemic hypoaldosteronism: Patient has a history of hypoaldosteronism treated with fludrocortisone. Patient's potassium was 4.7 during his stay. If patient remains hypertensive please consider decreasing his fludrocortisone to 0.05. Patient's fludrocortisone was decreased from 0.1 mg twice a day to 0.1 mg once daily on his previous admission and continued there on this  discharge. He remained hypertensive during his prior stay prompting the decrease.  4. CKD stage III: Patients creatinine 1.60 on discharge down from 1.82 from his previous admission. GFR was slightly above baseline during his stay.    Discharge Vitals:   BP 128/88 (BP Location: Right Arm)   Pulse 100   Temp 97.7 F (36.5 C) (Oral)   Resp 18   Ht 6\' 1"  (1.854 m)   Wt 204 lb 12.9 oz (92.9 kg)   SpO2 98%   BMI 27.02 kg/m   Pertinent Labs, Studies, and Procedures:  CMP Latest Ref Rng & Units 09/01/2017 08/31/2017 08/30/2017  Glucose 65 - 99 mg/dL 122(H) 186(H) 138(H)  BUN 6 - 20 mg/dL 25(H) 26(H) 31(H)  Creatinine 0.61 - 1.24 mg/dL 1.60(H) 1.82(H) 1.71(H)  Sodium 135 - 145 mmol/L 141 139 139  Potassium 3.5 - 5.1 mmol/L 3.7 4.1 3.9  Chloride 101 - 111 mmol/L 111 107 110  CO2 22 - 32 mmol/L 26 24 21(L)  Calcium 8.9 - 10.3 mg/dL 8.6(L) 8.8(L) 8.1(L)  Total Protein 6.5 - 8.1 g/dL - 7.3 -  Total Bilirubin 0.3 - 1.2 mg/dL - 1.0 -  Alkaline Phos 38 - 126 U/L - 94 -  AST 15 - 41 U/L - 32 -  ALT 17 - 63 U/L - 19 -   CBC Latest Ref Rng & Units 09/01/2017 08/31/2017  08/29/2017  WBC 4.0 - 10.5 K/uL 9.0 7.7 8.1  Hemoglobin 13.0 - 17.0 g/dL 11.8(L) 12.1(L) 11.6(L)  Hematocrit 39.0 - 52.0 % 36.7(L) 37.9(L) 36.5(L)  Platelets 150 - 400 K/uL 308 308 249   MRI examination of the brain: MRA HEAD IMPRESSION:  1. Stable intracranial MRA. No large or proximal arterial branch occlusion. No high-grade or correctable stenosis. 2. Diminutive left vertebral artery terminating in PICA. 3. Scattered intracranial atherosclerotic changes involving the medium and small arterial vessels as above. MRA NECK IMPRESSION:  1. Motion degraded exam. 2. Carotid arteries patent within the neck without high-grade or critical stenosis. 3. Widely patent dominant right vertebral artery. 4. Diffusely hypoplastic left vertebral artery, not well visualized, but grossly patent within the neck.  Discharge Instructions: Discharge Instructions    Call MD for:  difficulty breathing, headache or visual disturbances    Complete by:  As directed    Call MD for:  persistant dizziness or light-headedness    Complete by:  As directed    Call MD for:  redness, tenderness, or signs of infection (pain, swelling, redness, odor or green/yellow discharge around incision site)    Complete by:  As directed    Diet - low sodium heart healthy    Complete by:  As directed    Increase activity slowly    Complete by:  As directed       Signed: Kathi Ludwig, MD 09/03/2017, 2:16 PM   Pager: Pager# [phone]

## 2017-09-01 NOTE — Progress Notes (Signed)
Date: 09/01/2017  Patient name: Derrick Hendrix  Medical record number: [id number]  Date of birth: 07/17/1941   I have seen and evaluated Derrick Hendrix and discussed their care with the Residency Team. Briefly, Derrick Hendrix is a 76yo gentleman with PMH of CKD, PAD, HTN, hypoaldosteronism, DM2 who presented with abnormal movements of arms and legs.  He tells [place] that about 3 years ago after a stroke, he briefly developed "dancing" movement of his feet which he could not control.  These progressively got better and resolved.  However, when he presented to the hospital 4-5 days ago he noticed that the movements in his feet had come back along with movements in his left arm. He notes that they are not purposeful and he cannot control them.  He has had strokes before and his friend was concerned that he had some slurred speech when he came in.  He is a former smoker.  He was recently admitted for a wound on his feet and has been taking his Abx intermittently.  He has a lot of pain in his left foot.  He further, due to the movements, has had multiple falls, but has not injured himself per his report.  At last hospitalization, it was inferred that these movements were old, so they were not further investigated, but he now tells [place] that they are new, especially in the arm.    Fam Hx, and/or Soc Hx : FH of HTN.  He is a former smoker  Vitals:   09/01/17 0900 09/01/17 1400  BP: 138/86 128/88  Pulse: 86 100  Resp: 20 18  Temp: (!) 97.4 F (36.3 C) 97.7 F (36.5 C)  SpO2: 96% 98%   Physical Exam:  Gen: lying in bed, no acute distress Eyes: Muddy sclerae, no icterus HENT: NCAT, neck supple CV: RR, NR, no murmur Pulm: breathing comfortably, no audible wheezing Abd: Soft, +BS Ext: Thin, no edema.  He has a healing wound on the plantar lateral aspect of left foot Neuro: Persistent uncontrolled movements of left arm and feet, sensation and strength intact bilaterally.  F to N is abnormal in left hand and  worsens his movements.   Psych: Became upset during neuro exam, but otherwise pleasant.   Pertinent data  Cr 1.6 (stable at baseline) H/H 11/36 (stable) ESR 48 TSH 1.331  MRI/MRA as read by Dr. Jeannine Boga IMPRESSION: MRI HEAD IMPRESSION:  1. No acute intracranial infarct or other process identified. 2. Remote left cerebellar infarcts. 3. Generalized age-related cerebral atrophy with moderate chronic microvascular ischemic disease with superimposed remote lacunar infarcts involving the bilateral basal ganglia/corona radiata, stable.  MRA HEAD IMPRESSION:  1. Stable intracranial MRA. No large or proximal arterial branch occlusion. No high-grade or correctable stenosis. 2. Diminutive left vertebral artery terminating in PICA. 3. Scattered intracranial atherosclerotic changes involving the medium and small arterial vessels as above. MRA NECK IMPRESSION:  1. Motion degraded exam. 2. Carotid arteries patent within the neck without high-grade or critical stenosis. 3. Widely patent dominant right vertebral artery. 4. Diffusely hypoplastic left vertebral artery, not well visualized, but grossly patent within the neck.   Assessment and Plan: I have seen and evaluated the patient as outlined above. I agree with the formulated Assessment and Plan as detailed in the residents' note, with the following changes:   1. Hemiballismus - post stroke - Discussed with neurology and this can happen up to 5 years after a stroke and can wax and wane - Clonazepam and Keppra are options for  treatment, will start a trial of clonazepam - TSH and B12 normal - Continue blood pressure management and statin  Other issues per Dr. Nelma Rothman daily note.   Sid Falcon, MD 9/18/20182:48 PM

## 2017-09-01 NOTE — Progress Notes (Signed)
Pt d/c home no new concerns, all medication given, d/c instructions done with teach back, pt will need reenforcement. Pt taken home by [employer]

## 2017-09-01 NOTE — Evaluation (Signed)
Physical Therapy Evaluation Patient Details Name: Derrick Hendrix MRN: [id number] DOB: Oct 23, 1941 Today's Date: 09/01/2017   History of Present Illness  76 year old male presenting to the hospital with hemiballismus-like movements of the left arm and leg. At times he has movements on the left arm and leg that look dystonic, along with blepharospasm of the left eye  Clinical Impression  Orders received for PT evaluation. Patient demonstrates deficits in functional mobility as indicated below. Will benefit from continued skilled PT to address deficits and maximize function. Will see as indicated and progress as tolerated.      Follow Up Recommendations Outpatient PT;Supervision for mobility/OOB    Equipment Recommendations  Other (comment) (TBD)    Recommendations for Other Services       Precautions / Restrictions Precautions Precautions: Fall      Mobility  Bed Mobility Overal bed mobility: Needs Assistance Bed Mobility: Supine to Sit;Sit to Supine     Supine to sit: Supervision Sit to supine: Supervision   General bed mobility comments: supervision for safety  Transfers Overall transfer level: Needs assistance Equipment used: 1 person hand held assist Transfers: Sit to/from Stand Sit to Stand: Min assist         General transfer comment: min assist for stability, chorea like movements impeding ability to push off during power up  Ambulation/Gait Ambulation/Gait assistance: Min assist Ambulation Distance (Feet): 140 Feet Assistive device: 1 person hand held assist Gait Pattern/deviations: Ataxic;Narrow base of support;Staggering right;Staggering left Gait velocity: decreased Gait velocity interpretation: Below normal speed for age/gender General Gait Details: noted instability during mobility, relaince on assist due to decreased gross motor control and hemibalismic movements  Stairs            Wheelchair Mobility    Modified Rankin (Stroke Patients  Only)       Balance Overall balance assessment: History of Falls                                           Pertinent Vitals/Pain Pain Assessment: No/denies pain    Home Living Family/patient expects to be discharged to:: Private residence Living Arrangements: Non-relatives/Friends Available Help at Discharge: Friend(s) Type of Home: Apartment Home Access: Stairs to enter Entrance Stairs-Rails: Can reach both Entrance Stairs-Number of Steps: 4 Home Layout: One level   Additional Comments: patient reports that he has multiple friends that come and check on him    Prior Function Level of Independence: Independent         Comments: states that he has had multiple falls     Hand Dominance   Dominant Hand: Right    Extremity/Trunk Assessment   Upper Extremity Assessment Upper Extremity Assessment: Overall WFL for tasks assessed    Lower Extremity Assessment Lower Extremity Assessment: Overall WFL for tasks assessed (coordination deficits noted)       Communication   Communication: No difficulties  Cognition Arousal/Alertness: Awake/alert Behavior During Therapy: WFL for tasks assessed/performed Overall Cognitive Status: No family/caregiver present to determine baseline cognitive functioning                                        General Comments      Exercises     Assessment/Plan    PT Assessment Patient needs continued PT services  PT Problem List  Decreased strength;Decreased activity tolerance;Decreased balance;Decreased coordination;Decreased mobility;Decreased safety awareness       PT Treatment Interventions DME instruction;Gait training;Functional mobility training;Therapeutic activities;Therapeutic exercise;Balance training    PT Goals (Current goals can be found in the Care Plan section)  Acute Rehab PT Goals Patient Stated Goal: to go home PT Goal Formulation: With patient Time For Goal Achievement:  09/08/17 Potential to Achieve Goals: Fair    Frequency Min 3X/week   Barriers to discharge        Co-evaluation               AM-PAC PT "6 Clicks" Daily Activity  Outcome Measure Difficulty turning over in bed (including adjusting bedclothes, sheets and blankets)?: A Little Difficulty moving from lying on back to sitting on the side of the bed? : A Little Difficulty sitting down on and standing up from a chair with arms (e.g., wheelchair, bedside commode, etc,.)?: Unable Help needed moving to and from a bed to chair (including a wheelchair)?: A Little Help needed walking in hospital room?: A Little Help needed climbing 3-5 steps with a railing? : A Little 6 Click Score: 16    End of Session         PT Visit Diagnosis: History of falling (Z91.81);Ataxic gait (R26.0)    Time: 3300-7622 PT Time Calculation (min) (ACUTE ONLY): 21 min   Charges:   PT Evaluation $PT Eval Moderate Complexity: 1 Mod     PT G Codes:        [name], PT DPT  Board Certified Neurologic Specialist [place] 09/01/2017, 5:11 PM

## 2017-09-01 NOTE — Telephone Encounter (Signed)
Pt is admitted

## 2017-09-01 NOTE — Discharge Instructions (Signed)
Please follow-up with your primary care provider, Dr. Benjamine Mola, this Friday at 10:45 AM.  He had been prescribed to antibiotics, doxycycline, and Augmentin. Please continue to take these antibiotics 2 times daily until expire.  You have also been prescribed clonazepam, 0.25 mg to be taken 3 times a day for the arm and leg jerking movements.  If at any time he developed fever, chills, confusion, swelling, redness, or other concerning symptoms associated with your left foot for right toe please call the clinic or return to the ED.  Thank you for visiting the [facility].

## 2017-09-01 NOTE — Progress Notes (Signed)
Patient arrived to [facility]M12 from [facility]. Patient alert and oriented x 4. Patient with left side jerking and ataxia. Vital signs taken and stable. Telemetry monitor applied. Patient oriented to room, phone and call-bell.

## 2017-09-01 NOTE — Progress Notes (Signed)
   Subjective: Patient was lying in his bed sleeping upon entering the room today. He stated that although his movements are more pronounced, they are not new. Patient stated that his landlord brought him to the ED and strongly insisted that he remain until he was evaluated. Patient denied visual changes, headache, chest pain, abdominal pain, fever, chills, or other concerning symptoms this time.   Objective:  Vital signs in last 24 hours: Vitals:   09/01/17 0304 09/01/17 0500 09/01/17 0505 09/01/17 0700  BP: (!) 145/91  127/84 (!) 148/93  Pulse: 80  71 82  Resp: 16  18 18   Temp: 98.1 F (36.7 C)  98.1 F (36.7 C) 97.7 F (36.5 C)  TempSrc: Oral  Oral Oral  SpO2: 95%  96% 100%  Weight:  204 lb 12.9 oz (92.9 kg)    Height:       ROS negative except as per HPI.  Physical Exam  Constitutional: He appears well-developed and well-nourished. No distress.  HENT:  Head: Normocephalic and atraumatic.  Eyes: Conjunctivae and EOM are normal.  Neck: Normal range of motion.  Cardiovascular: Normal rate and regular rhythm.   No murmur heard. Pulmonary/Chest: Effort normal and breath sounds normal. No stridor. No respiratory distress.  Abdominal: Soft. Bowel sounds are normal. He exhibits no distension.  Musculoskeletal: He exhibits no edema.  Neurological: He is alert.  Skin: Skin is warm. He is not diaphoretic.  Psychiatric: He has a normal mood and affect.   Assessment/Plan:  Active Problems:   Hemiballismus  Abnormal limb movement: Patient presenting with hemiballismus like movements of L sided limbs. Unclear if this is acute or chronic in nature. DDx: autoimmune, inflammatory disorders, infection, medication-induced, toxic. No signs/sxs of infection. Not on any medications that could induce abnormal limb movement. MRI negative. Neurology following  -TSH, B1, B12 pending  -UA, UDS, CXR ordered per neuro recs  -Neurology following patients chronic conditions -Neurology in the room  for the visit, recommended Clonazepam 0.25 mg TID to titrate up as needed -Keppra if this fails  Left foot abscess: Patient was recently discharged 9/16 after I&D of L foot abscess. Wound cx with Staph epidermidis and Corynebacterium species. He was discharged on Augmentin and doxycycline, which he has not started.  -Continue augmentin  BID and doxy 100 mg BID   Hyporeninemic hypoaldosteronism:  -Continue home fludrocortisone 0.1 mg QD   CKD Stage II/HTN: BL Cr 1.5 -Fludrocortisone decreased 0.2 --> 0.1 mg QD on previous admission secondary to HTN   T2DM, newly diagnosed: A1c 11.1.  Discharged on Januvia 50 mg QD. Patient was not started on Lantus due to suspicion of medication non-adherence.  -Lantus 7U QHS  -SSI-S  Dispo: Anticipated discharge in approximately today(s).   Kathi Ludwig, MD 09/01/2017, 7:47 AM Pager: Pager# [phone]

## 2017-09-01 NOTE — Telephone Encounter (Signed)
Pt is inpt at this time

## 2017-09-01 NOTE — Progress Notes (Addendum)
Subjective: Patient continues to have left arm and leg hemiballism.   Exam: Vitals:   09/01/17 0505 09/01/17 0700  BP: 127/84 (!) 148/93  Pulse: 71 82  Resp: 18 18  Temp: 98.1 F (36.7 C) 97.7 F (36.5 C)  SpO2: 96% 100%    HEENT-  Normocephalic, no lesions, without obvious abnormality.  Normal external eye and conjunctiva.  Normal TM's bilaterally.  Normal auditory canals and external ears. Normal external nose, mucus membranes and septum.  Normal pharynx.  Neuro:  CN: Pupils are equal and round. They are symmetrically reactive from 3-->2 mm. EOMI without nystagmus. Facial sensation is intact to light touch. Face is symmetric at rest with normal strength and mobility. Hearing is intact to conversational voice. Palate elevates symmetrically and uvula is midline. Voice is normal in tone, pitch and quality. Bilateral SCM and trapezii are 5/5. Tongue is midline with normal bulk and mobility.  Motor: Normal bulk, tone, and strength. 5/5 throughout. No drift.  --Notable nonrhythmic intermittent nonsynchronized movements of left leg and arm very much like hemiballism. Sensation: Intact to light touch.  DTRs: 1+, symmetric  Toes downgoing bilaterally. No pathologic reflexes.   Pertinent Labs/Diagnostics: none  Etta Quill PA-C [facility] [phone]  Attending addendum Patient seen and examined.  Assessment This is a 76 year old male presenting to the hospital with hemiballismus-like movements of the left arm and leg. At times he has movements on the left arm and leg that look dystonic, along with blepharospasm of the left eye.  MRI of the brain is unrevealing. At this point likely hemiballism movements can be attributable to old stroke. These movements can occur up to 5 years after stroke. Patient is on no medications that can induce abnormal movements/except tremor of symptoms.  Impression Abnormal left arm and leg movements-hemi-ballistic MRI negative for acute  stroke.   Recs --Low dose Clonazepam 0.5 mg TID  And may go up if needed --May consider Keppra if Benzodiazepine not working  --Patient should follow-up outpatient with Dr. Posey Pronto, movement disorders neurologist at [facility] in 6-8 weeks or sooner if needed.  Please call inpatient neurology team with questions.  09/01/2017, 10:38 AM   Amie Portland, MD [facility] [phone]  If 7pm to 7am, please call on call as listed on AMION.

## 2017-09-01 NOTE — Telephone Encounter (Signed)
Hospital TOC per Dr Berline Lopes, discharge 09/01/2017, appt 09/04/2017.

## 2017-09-04 ENCOUNTER — Encounter: Payer: Self-pay | Admitting: Internal Medicine

## 2017-09-04 ENCOUNTER — Ambulatory Visit (INDEPENDENT_AMBULATORY_CARE_PROVIDER_SITE_OTHER): Payer: Medicare Other | Admitting: Internal Medicine

## 2017-09-04 DIAGNOSIS — I739 Peripheral vascular disease, unspecified: Secondary | ICD-10-CM | POA: Diagnosis not present

## 2017-09-04 DIAGNOSIS — E7800 Pure hypercholesterolemia, unspecified: Secondary | ICD-10-CM | POA: Diagnosis not present

## 2017-09-04 DIAGNOSIS — N183 Chronic kidney disease, stage 3 (moderate): Secondary | ICD-10-CM

## 2017-09-04 DIAGNOSIS — E1122 Type 2 diabetes mellitus with diabetic chronic kidney disease: Secondary | ICD-10-CM | POA: Diagnosis not present

## 2017-09-04 DIAGNOSIS — E119 Type 2 diabetes mellitus without complications: Secondary | ICD-10-CM | POA: Insufficient documentation

## 2017-09-04 DIAGNOSIS — Z8673 Personal history of transient ischemic attack (TIA), and cerebral infarction without residual deficits: Secondary | ICD-10-CM | POA: Diagnosis not present

## 2017-09-04 DIAGNOSIS — I129 Hypertensive chronic kidney disease with stage 1 through stage 4 chronic kidney disease, or unspecified chronic kidney disease: Secondary | ICD-10-CM | POA: Diagnosis not present

## 2017-09-04 DIAGNOSIS — L02612 Cutaneous abscess of left foot: Secondary | ICD-10-CM

## 2017-09-04 DIAGNOSIS — G255 Other chorea: Secondary | ICD-10-CM | POA: Diagnosis not present

## 2017-09-04 DIAGNOSIS — E118 Type 2 diabetes mellitus with unspecified complications: Secondary | ICD-10-CM

## 2017-09-04 LAB — VITAMIN B1: VITAMIN B1 (THIAMINE): 84.4 nmol/L (ref 66.5–200.0)

## 2017-09-04 NOTE — Progress Notes (Signed)
   CC: hemiballism   HPI:  [name] is a 76 y.o. with past medical history as documented below presenting for hospital follow up for hemiballism. During hospitalization, an MRI was performed and showed no acute CVA but multiple chronic processes. Neurology was consulted and believes his hemiballism is due to post stroke recrudescence.   He states his symptoms are worse when he is upset or irritated. He was discharged on 0.25 mg of Clonazepam three times daily. The patient brought in all is medications today and after reviewing his medications, it seems that he does not have the Clonazepam. Since discharge, the patient states he feels better and the uncontrollable movements are not as severe as they were prior to admission. He has no other acute complaints at this time.   The patient was also recently diagnosis with Type 2 diabetes with an A1C of 11.1 and was discharged on Tradjenta 5 mg. He does not entirely understand his new diabetes diagnosis. Denies polydipsia or polyuria. Denies symptoms of hypoglycemia.   Past Medical History:  Diagnosis Date  . Chronic kidney disease (CKD), stage III (moderate)   . Daily headache    "lately" (04/17/2016)  . Depression   . Heart murmur   . High cholesterol   . Hyperkalemia 08/2014  . Hypertension   . PAD (peripheral artery disease) ([place])   . Stroke ([place]) 03/2016   hx of PAD; j"ust lots of headaches since" (04/17/2016)   Review of Systems:   Review of Systems  Constitutional: Negative for chills and fever.  Respiratory: Negative for shortness of breath and wheezing.   Cardiovascular: Negative for chest pain.  Gastrointestinal: Negative for heartburn, nausea and vomiting.  Musculoskeletal: Positive for back pain and joint pain.  Neurological: Negative for dizziness, sensory change and focal weakness.    Physical Exam:  Vitals:   09/04/17 1118  BP: (!) 149/62  Pulse: 95  Temp: 98.2 F (36.8 C)  TempSrc: Oral  SpO2: 100%  Weight: 207  lb 1.6 oz (93.9 kg)  Height: 6\' 1"  (1.854 m)   General: Sitting comfortably in wheel chair, abnormal left sided movements, not causes the patient distress HEENT: [place]/AT, EOMI, no scleral icterus Cardiac: RRR, No R/M/G appreciated Pulm: normal effort, CTAB Abd: soft, non tender, non distended, BS normal Ext: extremities well perfused, no peripheral edema.   Left foot: no abscess, erythema, oozing of drainage, I&D site well healed.  Neuro: alert and oriented X3, cranial nerves II-XII grossly intact   Assessment & Plan:   See Encounters Tab for problem based charting.  Patient seen with Dr. Evette Doffing

## 2017-09-04 NOTE — Assessment & Plan Note (Signed)
S/p I&D, on Augmentin and Doxycycline. No complaints of pain. Currently taking both antibiotics as prescribed. Site is healing well without erythema, oozing, or other signs of infection.  Plan: -Advised patient to finish the course of antibiotics as prescirbed

## 2017-09-04 NOTE — Patient Instructions (Addendum)
Mr. Dickerman,   I will call your pharmacy and address the issue with your new medicine, Clonazepam.   Continue taking your medicines like you have been.

## 2017-09-04 NOTE — Assessment & Plan Note (Addendum)
Newly diagnosed. A1C 11.1. Patient is currently on Tradjenta 5 mg.   Plan: -Continue Tradjenta 5 mg -Upcoming appointment with PCP in 09/2017, consider diabetes education

## 2017-09-04 NOTE — Assessment & Plan Note (Signed)
It is thought that the patient's acute onset hemiballism is due to post stroke recrudescence. Toxicology, infection, and metabolic derangements were ruled out during hospitalization.  The patient was started on Klonopin 0.25 mg TID, but it seems that he does not have it with his current medications. [place] and they did not have that prescription in their records.   Plan: -Ordered 0.25 mg Klonopin TID

## 2017-09-07 NOTE — Progress Notes (Signed)
Internal Medicine Clinic Attending  I saw and evaluated the patient.  I personally confirmed the key portions of the history and exam documented by Dr. LaCroce and I reviewed pertinent patient test results.  The assessment, diagnosis, and plan were formulated together and I agree with the documentation in the resident's note.  

## 2017-09-08 NOTE — Telephone Encounter (Signed)
Pt seen 09/21 for HFU

## 2017-09-10 NOTE — Addendum Note (Signed)
Addended by: Lianne Cure A on: 09/10/2017 01:53 PM   Modules accepted: Orders

## 2017-09-11 ENCOUNTER — Encounter: Payer: Medicare Other | Admitting: Internal Medicine

## 2017-09-18 ENCOUNTER — Other Ambulatory Visit: Payer: Self-pay | Admitting: Internal Medicine

## 2017-10-02 ENCOUNTER — Encounter: Payer: Medicare Other | Admitting: Internal Medicine

## 2017-10-23 ENCOUNTER — Other Ambulatory Visit: Payer: Self-pay | Admitting: Internal Medicine

## 2017-10-26 NOTE — Telephone Encounter (Signed)
Klonopin rx called to [facility].

## 2017-10-30 ENCOUNTER — Encounter: Payer: Medicare Other | Admitting: Internal Medicine

## 2017-11-01 ENCOUNTER — Other Ambulatory Visit: Payer: Self-pay

## 2017-11-01 ENCOUNTER — Emergency Department (HOSPITAL_COMMUNITY)
Admission: EM | Admit: 2017-11-01 | Discharge: 2017-11-01 | Disposition: A | Discharge status: Home / Self Care | Payer: Medicare Other | Attending: Emergency Medicine | Admitting: Emergency Medicine

## 2017-11-01 DIAGNOSIS — Z7984 Long term (current) use of oral hypoglycemic drugs: Secondary | ICD-10-CM | POA: Diagnosis not present

## 2017-11-01 DIAGNOSIS — Z7902 Long term (current) use of antithrombotics/antiplatelets: Secondary | ICD-10-CM | POA: Insufficient documentation

## 2017-11-01 DIAGNOSIS — G255 Other chorea: Secondary | ICD-10-CM | POA: Diagnosis not present

## 2017-11-01 DIAGNOSIS — R251 Tremor, unspecified: Secondary | ICD-10-CM | POA: Diagnosis not present

## 2017-11-01 DIAGNOSIS — Z79899 Other long term (current) drug therapy: Secondary | ICD-10-CM | POA: Diagnosis not present

## 2017-11-01 DIAGNOSIS — Z87891 Personal history of nicotine dependence: Secondary | ICD-10-CM | POA: Insufficient documentation

## 2017-11-01 DIAGNOSIS — E1122 Type 2 diabetes mellitus with diabetic chronic kidney disease: Secondary | ICD-10-CM | POA: Insufficient documentation

## 2017-11-01 DIAGNOSIS — Z8673 Personal history of transient ischemic attack (TIA), and cerebral infarction without residual deficits: Secondary | ICD-10-CM | POA: Insufficient documentation

## 2017-11-01 DIAGNOSIS — N183 Chronic kidney disease, stage 3 (moderate): Secondary | ICD-10-CM | POA: Diagnosis not present

## 2017-11-01 DIAGNOSIS — I129 Hypertensive chronic kidney disease with stage 1 through stage 4 chronic kidney disease, or unspecified chronic kidney disease: Secondary | ICD-10-CM | POA: Insufficient documentation

## 2017-11-01 DIAGNOSIS — I6789 Other cerebrovascular disease: Secondary | ICD-10-CM | POA: Diagnosis not present

## 2017-11-01 MED ORDER — CLONAZEPAM 0.25 MG PO TBDP
0.2500 mg | ORAL_TABLET | Freq: Two times a day (BID) | ORAL | Status: DC
  Administered 2017-11-01: 0.25 mg via ORAL
  Filled 2017-11-01: qty 2

## 2017-11-01 MED ORDER — CLONAZEPAM 0.5 MG PO TABS: 0.2500 mg | ORAL_TABLET | Freq: Three times a day (TID) | ORAL | 0 refills | Status: DC

## 2017-11-01 NOTE — Discharge Instructions (Signed)
I have written you a prescription for Klonopin. Please take three times a day for jerking arm movements.   Follow up with your primary doctor for further management and refills  Your blood pressure was elevated in the ER today, please have this rechecked  Please return to the ER if you have any new or worsening symptoms.

## 2017-11-01 NOTE — ED Triage Notes (Signed)
Pt from home via [facility]. Pt c/o involuntary jerking that has been going on for a few months now. Was controlled under Clonazepam, but pt sts he lost it and has not had it for a while. 6/10 pain in L shoulder due to jerking.

## 2017-11-01 NOTE — ED Provider Notes (Signed)
Derrick Hendrix   CSN: [id number] Arrival date & time: 11/01/17  2014     History   Chief Complaint Chief Complaint  Patient presents with  . Tremors    HPI Unnamed Derrick Hendrix is a 76 y.o. male.  HPI   [name] is a 76yo male with a history of cerebellar stroke, hemiballismus, CKD stage III, PAD, hypertension, BPH who presented the emergency department for evaluation of worsening jerking in the left arm and leg after running out of his Klonopin medication.  Patient was admitted to the hospital on 9/17 for jerking left sided arm and leg movements. MRI was unremarkable for acute infarct.  Neurology treated him with 0.25 mg clonazepam 3 times daily and he had subsequent improvement and was discharged. He states that he has been taking the clonazepam as prescribed which has been helping to "slow the movements." Reports that he ran out of this medication two days ago and has had worsening of the jerking movements in the left arm and leg ever since. States that the jerking in his leg is making it hard for him to walk.  Also reports 3/10 severity "soreness" in the left shoulder due to constant jerking movement.  He denies fever, numbness, weakness, visual disturbance, headache, dysarthria, dysphagia, chest pain, shortness of breath, abdominal pain.  Past Medical History:  Diagnosis Date  . Chronic kidney disease (CKD), stage III (moderate)   . Daily headache    "lately" (04/17/2016)  . Depression   . Heart murmur   . High cholesterol   . Hyperkalemia 08/2014  . Hypertension   . PAD (peripheral artery disease) ([place])   . Stroke [facility]) 03/2016   hx of PAD; j"ust lots of headaches since" (04/17/2016)    Patient Active Problem List   Diagnosis Date Noted  . Type 2 diabetes mellitus ([place]) 09/04/2017  . Hemiballism 08/31/2017  . Abscess of foot without toes, left 08/28/2017  . Preop cardiovascular exam   . Osteomyelitis ([place])   . Ischemic pain  of foot, right   . Paronychia of great toe, right   . Severe peripheral arterial disease ([place]) 02/27/2017  . Right foot pain 02/09/2017  . Hyperlipemia 11/11/2016  . Cerebellar infarct ([place]) 05/06/2016  . Benign prostatic hyperplasia with urinary obstruction   . Orthostatic hypotension 04/04/2016  . Urinary retention 03/20/2016  . Cerebrovascular accident (CVA) due to occlusion of left posterior cerebral artery ([place])   . Dizziness 03/07/2016  . Neuropathic pain 02/06/2015  . Constipation 11/07/2014  . Essential hypertension   . Hyperkalemia 10/20/2014  . Normocytic anemia 10/20/2014  . Hyporeninemic hypoaldosteronism ([place]) 08/24/2014  . Healthcare maintenance 07/13/2014  . Atherosclerosis of leg with intermittent claudication, R 06/29/2014  . Weight loss, unintentional 06/27/2014  . Current smoker 06/27/2014  . Alcohol use ([place]) 06/27/2014  . Stable angina ([place]) 06/27/2014  . Vitamin B12 deficiency 06/27/2014  . CKD (chronic kidney disease), stage III ([place]) 06/26/2014  . Headache 06/26/2014    Past Surgical History:  Procedure Laterality Date  . Abdominal Aortogram w/Lower Extremity N/A 03/02/2017   Performed by Conrad Baroda, MD at [facility]  . Loop Recorder Insertion N/A 03/04/2016   Performed by Thompson Grayer, MD at [facility]  . Peripheral Vascular Intervention Right 03/06/2017   Performed by Elam Dutch, MD at [facility]       Home Medications    Prior to Admission medications   Medication  Sig Start Date End Date Taking? Authorizing Provider  acetaminophen (TYLENOL) 500 MG tablet Take 2 tablets (1,000 mg total) by mouth every 6 (six) hours as needed for mild pain (or Fever >/= 101). 04/05/16   Loleta Chance, MD  amLODipine (NORVASC) 5 MG tablet Take 1 tablet (5 mg total) by mouth daily. 06/12/17 06/12/18  [name], MD  atorvastatin (LIPITOR) 80 MG tablet TAKE 1 TABLET (80 MG TOTAL) BY MOUTH DAILY. 09/21/17   Rice, Resa Miner, MD   clonazePAM (KLONOPIN) 0.25 MG disintegrating tablet DISSOLVE 1 TABLET UNDER THE TONGUE THREE TIMES A DAY 10/26/17   Rice, Resa Miner, MD  clopidogrel (PLAVIX) 75 MG tablet TAKE 1 TABLET (75 MG TOTAL) BY MOUTH DAILY. 07/02/17   Rice, Resa Miner, MD  finasteride (PROSCAR) 5 MG tablet TAKE 1 TABLET (5 MG TOTAL) BY MOUTH DAILY. 09/21/17   Rice, Resa Miner, MD  fludrocortisone (FLORINEF) 0.1 MG tablet Take 1 tablet (0.1 mg total) by mouth daily. 08/31/17   Lorella Nimrod, MD  furosemide (LASIX) 40 MG tablet Take 1 tablet (40 mg total) by mouth daily. 05/27/17 05/27/18  Rice, Resa Miner, MD  HYDROcodone-acetaminophen (NORCO/VICODIN) 5-325 MG tablet TAKE 1 TABLET BY MOUTH EVERY 6 HOURS AS NEEDED FOR MODERATE PAIN 08/31/17   [provider]  ipratropium (ATROVENT) 0.06 % nasal spray Place 2 sprays into the nose 3 (three) times daily. Patient taking differently: Place 2 sprays into the nose daily as needed for rhinitis.  02/06/15   Luan Moore, MD  linagliptin (TRADJENTA) 5 MG TABS tablet Take 1 tablet (5 mg total) by mouth daily. 09/01/17   Kathi Ludwig, MD  mupirocin ointment (BACTROBAN) 2 % Apply 1 application topically 2 (two) times daily. 05/25/17   Edrick Kins, DPM  nitroGLYCERIN (NITROSTAT) 0.4 MG SL tablet Place 1 tablet (0.4 mg total) under the tongue every 5 (five) minutes x 3 doses as needed for chest pain. 11/29/14   Luan Moore, MD  tamsulosin (FLOMAX) 0.4 MG CAPS capsule TAKE 1 CAPSULE (0.4 MG TOTAL) BY MOUTH DAILY AFTER SUPPER. 07/02/17   Rice, Resa Miner, MD    Family History Family History  Problem Relation Age of Onset  . Hypertension Mother        died age 73  . Diabetes Mother     Social History Social History   Tobacco Use  . Smoking status: Former Smoker    Years: 66.00    Types: Cigarettes    Start date: 06/23/2017  . Smokeless tobacco: Never Used  Substance Use Topics  . Alcohol use: No    Alcohol/week: 0.0 oz    Comment: 04/17/2016 "nothing  in the last 3-4 years"  . Drug use: No     Allergies   Aspirin   Review of Systems Review of Systems  Constitutional: Negative for chills, fatigue and fever.  HENT: Negative for trouble swallowing.   Eyes: Negative for visual disturbance.  Respiratory: Negative for shortness of breath.   Cardiovascular: Negative for chest pain and palpitations.  Gastrointestinal: Negative for abdominal pain, nausea and vomiting.  Musculoskeletal: Positive for arthralgias (left shoulder soreness) and gait problem (jerking of left leg making it hard to ambulate independently).  Skin: Negative for wound.  Neurological: Negative for dizziness, weakness, numbness and headaches.  Psychiatric/Behavioral: Negative for agitation.     Physical Exam Updated Vital Signs BP 128/67 (BP Location: Right Arm)   Pulse (!) 106   Temp 98.7 F (37.1 C) (Oral)   Resp 20  Ht 6\' 2"  (1.88 m)   Wt 92.5 kg (204 lb)   SpO2 95%   BMI 26.19 kg/m   Physical Exam  Constitutional: He appears well-developed and well-nourished. No distress.  Patient with non-stop jerking movements of the left arm and left leg. Appears uncomfortable.   HENT:  Head: Normocephalic and atraumatic.  Mouth/Throat: Oropharynx is clear and moist. No oropharyngeal exudate.  Eyes: Conjunctivae and EOM are normal. Pupils are equal, round, and reactive to light. Right eye exhibits no discharge. Left eye exhibits no discharge.  Neck: Normal range of motion. Neck supple.  Cardiovascular: Normal rate, regular rhythm and intact distal pulses. Exam reveals no friction rub.  No murmur heard. Pulmonary/Chest: Effort normal. No stridor. No respiratory distress. He has no wheezes. He has no rales.  Abdominal: Soft. Bowel sounds are normal. There is no tenderness. There is no guarding.  Musculoskeletal:  Left shoulder without tenderness. Full active ROM. No swelling, erythema or ecchymosis present. No step-off, crepitus, or deformity appreciated. 5/5  muscle strength of UE. 2+ radial pulse, sensation intact and all compartments soft.  Neurological: He is alert. Coordination normal.  Mental Status:  Alert, oriented, thought content appropriate. Speech without evidence of aphasia. Able to follow 2 step commands.  Cranial Nerves:  II:  Peripheral visual fields grossly normal, pupils equal, round, reactive to light III,IV, VI: ptosis not present, extra-ocular motions intact bilaterally. Left eyelid twitching some.  V,VII: smile symmetric, facial light touch sensation equal VIII: hearing grossly normal to voice  X: uvula elevates symmetrically  XI: bilateral shoulder shrug symmetric and strong XII: midline tongue extension without fassiculations Motor:  Strength: 5/5 in upper and lower extremities bilaterally including strong and equal grip strength and dorsiflexion/plantar flexion Sensory: Light touch normal in all extremities.  Cerebellar: normal finger-to-nose with right hand. Unable to voluntarily touch finger to nose with left hand.   Skin: Skin is warm and dry. Capillary refill takes less than 2 seconds. He is not diaphoretic.  Psychiatric: He has a normal mood and affect. His behavior is normal.  Nursing Hendrix and vitals reviewed.    ED Treatments / Results  Labs (all labs ordered are listed, but only abnormal results are displayed) Labs Reviewed - No data to display  EKG  EKG Interpretation None       Radiology No results found.  Procedures Procedures (including critical care time)  Medications Ordered in ED Medications  clonazePAM (KLONOPIN) disintegrating tablet 0.25 mg (not administered)     Initial Impression / Assessment and Plan / ED Course  I have reviewed the triage vital signs and the nursing notes.  Pertinent labs & imaging results that were available during my care of the patient were reviewed by me and considered in my medical decision making (see chart for details).  Clinical Course as of Nov 02 2243  Nancy Fetter Nov 01, 2017  2240 On recheck patients jerking movements are much less pronounced. He states that he is feeling better. Asking for a prescription to go home with.   [ES]    Clinical Course User Index [ES] Glyn Ade, PA-C    Patient presents with worsening hemiballismus after running out of Klonopin medication two days ago. Denies new focal neurological deficits. He was given home dose of Klonopin medication with subsequent improvement. Will send him home with a short course of Klonopin and have given him instructions to follow up with his primary doctor for further medication management. He is able to ambulate independently.  His blood pressure was mildly elevated, counseled him to have this rechecked.  Patient agrees and voices understanding to the plan.   Final Clinical Impressions(s) / ED Diagnoses   Final diagnoses:  Hemiballismus    ED Discharge Orders        Ordered    clonazePAM (KLONOPIN) 0.5 MG tablet  3 times daily     11/01/17 2244       Bernarda Caffey 11/01/17 2311    Noemi Chapel, MD 11/02/17 1444

## 2017-11-03 ENCOUNTER — Telehealth: Payer: Self-pay | Admitting: Cardiology

## 2017-11-03 NOTE — Telephone Encounter (Signed)
Attempted to speak w/ pt over the phone b/c his home monitor is not connected. Pt is very difficult to understand over the phone. I informed pt to have a family member call me when someone come to visit. Pt is going to use the number the showed up on his caller ID.

## 2017-11-12 ENCOUNTER — Encounter: Payer: Self-pay | Admitting: Cardiology

## 2017-11-18 ENCOUNTER — Other Ambulatory Visit: Payer: Self-pay | Admitting: Internal Medicine

## 2017-12-23 ENCOUNTER — Encounter: Payer: Self-pay | Admitting: Podiatry

## 2017-12-23 ENCOUNTER — Ambulatory Visit (INDEPENDENT_AMBULATORY_CARE_PROVIDER_SITE_OTHER): Payer: Medicare Other | Admitting: Podiatry

## 2017-12-23 DIAGNOSIS — M79609 Pain in unspecified limb: Principal | ICD-10-CM

## 2017-12-23 DIAGNOSIS — B351 Tinea unguium: Secondary | ICD-10-CM | POA: Diagnosis not present

## 2017-12-23 DIAGNOSIS — M79676 Pain in unspecified toe(s): Secondary | ICD-10-CM

## 2017-12-24 ENCOUNTER — Other Ambulatory Visit: Payer: Self-pay | Admitting: Internal Medicine

## 2017-12-27 NOTE — Progress Notes (Signed)
   SUBJECTIVE Patient with a history of diabetes mellitus presents to office today complaining of elongated, thickened nails. Pain while ambulating in shoes. Patient is unable to trim their own nails.   Past Medical History:  Diagnosis Date  . Chronic kidney disease (CKD), stage III (moderate) (H[facility])   . Daily headache    "lately" (04/17/2016)  . Depression   . Heart murmur   . High cholesterol   . Hyperkalemia 08/2014  . Hypertension   . PAD (peripheral artery disease) ([place])   . Stroke ([place]) 03/2016   hx of PAD; j"ust lots of headaches since" (04/17/2016)    OBJECTIVE General Patient is awake, alert, and oriented x 3 and in no acute distress. Derm Skin is dry and supple bilateral. Negative open lesions or macerations. Remaining integument unremarkable. Nails are tender, long, thickened and dystrophic with subungual debris, consistent with onychomycosis, right foot. No signs of infection noted. Vasc  DP and PT pedal pulses palpable bilaterally. Temperature gradient within normal limits.  Neuro Epicritic and protective threshold sensation diminished bilaterally.  Musculoskeletal Exam No symptomatic pedal deformities noted bilateral. Muscular strength within normal limits.  ASSESSMENT 1. Diabetes Mellitus w/ peripheral neuropathy 2. Onychomycosis of nail due to dermatophyte right foot 3. Pain in right foot   PLAN OF CARE 1. Patient evaluated today. 2. Instructed to maintain good pedal hygiene and foot care. Stressed importance of controlling blood sugar.  3. Mechanical debridement of nails 1-5 of the right foot performed using a nail nipper. Filed with dremel without incident.  4. Return to clinic in 3 mos.     Edrick Kins, DPM [facility]  Dr. Edrick Kins, [address]                                        [place], [place] [address]                Office [phone]  Fax [phone]

## 2017-12-28 ENCOUNTER — Other Ambulatory Visit: Payer: Self-pay | Admitting: Internal Medicine

## 2018-02-03 ENCOUNTER — Encounter: Payer: Self-pay | Admitting: Cardiology

## 2018-02-10 ENCOUNTER — Telehealth: Payer: Self-pay | Admitting: *Deleted

## 2018-02-10 ENCOUNTER — Other Ambulatory Visit: Payer: Self-pay

## 2018-02-10 ENCOUNTER — Ambulatory Visit (INDEPENDENT_AMBULATORY_CARE_PROVIDER_SITE_OTHER): Payer: Medicare Other | Admitting: Internal Medicine

## 2018-02-10 VITALS — BP 167/77 | HR 87 | Temp 97.8°F | Ht 74.0 in | Wt 205.5 lb

## 2018-02-10 DIAGNOSIS — E2749 Other adrenocortical insufficiency: Secondary | ICD-10-CM

## 2018-02-10 DIAGNOSIS — I10 Essential (primary) hypertension: Secondary | ICD-10-CM

## 2018-02-10 DIAGNOSIS — G8389 Other specified paralytic syndromes: Secondary | ICD-10-CM | POA: Diagnosis not present

## 2018-02-10 DIAGNOSIS — I69365 Other paralytic syndrome following cerebral infarction, bilateral: Secondary | ICD-10-CM

## 2018-02-10 DIAGNOSIS — Z8669 Personal history of other diseases of the nervous system and sense organs: Secondary | ICD-10-CM

## 2018-02-10 DIAGNOSIS — Z87891 Personal history of nicotine dependence: Secondary | ICD-10-CM | POA: Diagnosis not present

## 2018-02-10 DIAGNOSIS — R059 Cough, unspecified: Secondary | ICD-10-CM

## 2018-02-10 DIAGNOSIS — Z862 Personal history of diseases of the blood and blood-forming organs and certain disorders involving the immune mechanism: Secondary | ICD-10-CM

## 2018-02-10 DIAGNOSIS — R0602 Shortness of breath: Secondary | ICD-10-CM

## 2018-02-10 DIAGNOSIS — R05 Cough: Secondary | ICD-10-CM | POA: Diagnosis present

## 2018-02-10 DIAGNOSIS — Z7902 Long term (current) use of antithrombotics/antiplatelets: Secondary | ICD-10-CM | POA: Diagnosis not present

## 2018-02-10 DIAGNOSIS — E778 Other disorders of glycoprotein metabolism: Secondary | ICD-10-CM | POA: Diagnosis not present

## 2018-02-10 DIAGNOSIS — G255 Other chorea: Secondary | ICD-10-CM

## 2018-02-10 DIAGNOSIS — Z79899 Other long term (current) drug therapy: Secondary | ICD-10-CM | POA: Diagnosis not present

## 2018-02-10 MED ORDER — AMLODIPINE BESYLATE 10 MG PO TABS: 10.0000 mg | ORAL_TABLET | Freq: Every day | ORAL | 2 refills | Status: DC

## 2018-02-10 NOTE — Telephone Encounter (Signed)
I agree. Thank you.

## 2018-02-10 NOTE — Patient Instructions (Addendum)
Derrick Hendrix,   Please stop taking amlodipine 5 mg for your blood pressure. I sent a new prescription for amlodipine 10 mg. You will take 1 tablet once a day. Please come back in 4 weeks to recheck your blood pressure.   I also ordered a cane for you. You will get a call to get this set up for you.   For your cough, you might have a viral infection. We will continue to monitor your symptoms for now. Please return to clinic if this worsens or if it does not improve within the next 2 weeks.   Please call us if you have any questions.

## 2018-02-10 NOTE — Telephone Encounter (Signed)
Pt presents to front desk c/o "jerking" all over, it appears that left side of his body is constantly with spasm type tremors. His speech is very interrupted, almost like a stutter. Pt is very upset and worried that maybe because he is poor that no one has time to help him.he states he cannot sleep at night due to needing to get up and go to bathroom 10 to 12 times nightly and has incontinence.  He is reassured and calmed. Very nice man. He states sometimes he forgets to take his medicine.he is scheduled for ACC this am,

## 2018-02-11 ENCOUNTER — Encounter: Payer: Self-pay | Admitting: Internal Medicine

## 2018-02-11 NOTE — Progress Notes (Signed)
   CC: Whistling in chest   HPI:  [name] is a 77 y.o. male with PMH listed below who presents to clinic with 1-day history of flu-like symptoms. Please see problem based assessment and plan for further details.   Past Medical History:  Diagnosis Date  . Chronic kidney disease (CKD), stage III (moderate) (HCC)   . Daily headache    "lately" (04/17/2016)  . Depression   . Heart murmur   . High cholesterol   . Hyperkalemia 08/2014  . Hypertension   . PAD (peripheral artery disease) ([place])   . Stroke ([place]) 03/2016   hx of PAD; j"ust lots of headaches since" (04/17/2016)   Review of Systems:   Review of Systems  Constitutional: Negative for chills, fever and malaise/fatigue.  Respiratory: Positive for shortness of breath. Negative for cough.   Gastrointestinal: Negative for abdominal pain.  Musculoskeletal: Negative for myalgias.    Physical Exam:  Vitals:   02/10/18 1004 02/10/18 1104  BP: (!) 160/103 (!) 167/77  Pulse: 90 87  Temp: 97.8 F (36.6 C)   TempSrc: Oral   SpO2: 98%   Weight: 205 lb 8 oz (93.2 kg)   Height: 6\' 2"  (1.88 m)    General: pleasant male, well-developed, well nourished, in no acute distress  HENT: neck supple and FROM, OP clear without exudates or erythema Cardiac: regular rate and rhythm, nl S1/S2, no murmurs, rubs or gallops  Pulm: CTAB, no wheezes or crackles, no increased work of breathing    Assessment & Plan:   See Encounters Tab for problem based charting.  Patient discussed with Dr. Dareen Piano

## 2018-02-11 NOTE — Assessment & Plan Note (Signed)
Patient reports feeling unsteady on his feet and is requesting a cane. He has a history of hemiballismus due to post stroke recrudescence that is causing him difficulty walking.   - DME order for can placed

## 2018-02-11 NOTE — Assessment & Plan Note (Addendum)
Patient presents for HTN follow-up. He is currently on amlodipine 5 mg daily reports compliance. BP uncontrolled, 160/103 today. Currently asymptomatic. He is on fludrocortisone for hypoproteinemic hypoaldosteronism and has had issues in the past with hyperkalemia when on antihypertensive medications that alter K regulation.  He also has a prior history of dizziness and falls when BP tightly controlled. Will increase amlodipine to 10 mg and reassess in 4 weeks.  - Increase amlodipine 5 --> 10 mg QD  - Follow-up in 4 weeks for BP recheck

## 2018-02-11 NOTE — Assessment & Plan Note (Signed)
Patient complains of whistling in his chest that started last.  He is a poor historian and also reports sensation has been present for "a while."  States he feels like he has fluid in his lungs.  He denies fever, chills, cough, headache, nasal congestion, and myalgias.  Has not tried anything for this.  On exam he appears comfortable and is able to speak in full sentences.  He is satting well on room air.  Lungs sound clear to auscultation.  Advised patient we will continue to monitor.  Advised to call clinic if his symptoms worsen or if he develops new symptoms.  - Continue to monitor symptoms  - Would benefit from PFTs in the future given long history of smoking in the past

## 2018-02-11 NOTE — Progress Notes (Signed)
Internal Medicine Clinic Attending  Case discussed with Dr. Isac Sarna at the time of the visit.  We reviewed the resident's history and exam and pertinent patient test results.  I agree with the assessment, diagnosis, and plan of care documented in the resident's note.  Of note, patient had a DME order for a cane placed.

## 2018-02-18 ENCOUNTER — Other Ambulatory Visit: Payer: Self-pay | Admitting: Internal Medicine

## 2018-03-01 ENCOUNTER — Encounter (HOSPITAL_COMMUNITY): Payer: Medicare Other

## 2018-03-01 ENCOUNTER — Ambulatory Visit: Payer: Medicare Other | Admitting: Family

## 2018-03-16 ENCOUNTER — Telehealth: Payer: Self-pay | Admitting: *Deleted

## 2018-03-16 NOTE — Telephone Encounter (Signed)
STROKE-AF Research study: Spoke with patients neighbor about getting Mr. Rockhill in [place] office to check loop before 04/01/18. She will relay message.

## 2018-03-24 ENCOUNTER — Encounter: Payer: Medicare Other | Admitting: Podiatry

## 2018-03-24 ENCOUNTER — Other Ambulatory Visit: Payer: Self-pay | Admitting: Internal Medicine

## 2018-03-30 ENCOUNTER — Telehealth: Payer: Self-pay | Admitting: *Deleted

## 2018-03-30 NOTE — Telephone Encounter (Signed)
STROKE~AF Research study Called patient this morning after mailing letter (see below) he states he can come Friday @ 10:00 phone got disconnected and I called back with garage code.

## 2018-03-31 NOTE — Progress Notes (Signed)
This encounter was created in error - please disregard.

## 2018-04-02 ENCOUNTER — Encounter: Payer: Medicare Other | Admitting: *Deleted

## 2018-04-02 VITALS — BP 148/80 | HR 94 | Resp 16

## 2018-04-02 DIAGNOSIS — Z006 Encounter for examination for normal comparison and control in clinical research program: Secondary | ICD-10-CM

## 2018-04-09 NOTE — Progress Notes (Signed)
Late entry: Pt came in on 04/02/18 for 24 month stroke af study. Device interrogated, will have Amber or Allred to review the interrogation.   Will see patient back in August for his next check.  Asked patient to put the device box by his bedside and plug it in to make sure it transmitted every night. He agreed and said he would hook it up.  Med change was only a increase in amlodipine and added clonazepam to help with hemiballismus. No cardiac or stroke med related changes were made.

## 2018-04-12 ENCOUNTER — Telehealth: Payer: Self-pay | Admitting: *Deleted

## 2018-04-12 NOTE — Telephone Encounter (Signed)
LMOVM for patient and LMOVM for Feliciana Rossetti [facility]) requesting call back to the [facility] to set up appointment with Dr. Rayann Heman.  Patient's LINQ interrogation from 04/02/18 Research visit reviewed by Dr. Rayann Heman, who would like to discuss findings with patient.

## 2018-04-13 NOTE — Telephone Encounter (Signed)
LMOVM for patient requesting return call to the [facility].  Will attempt to schedule f/u with Dr. Rayann Heman.

## 2018-04-17 ENCOUNTER — Other Ambulatory Visit: Payer: Self-pay | Admitting: Internal Medicine

## 2018-04-19 ENCOUNTER — Encounter: Payer: Self-pay | Admitting: *Deleted

## 2018-04-19 NOTE — Telephone Encounter (Signed)
LMOVM for patient requesting return call to the [facility]. Will attempt to schedule f/u with Dr. Rayann Heman.  Letter mailed requesting f/u.

## 2018-04-21 ENCOUNTER — Telehealth: Payer: Self-pay | Admitting: *Deleted

## 2018-04-21 NOTE — Telephone Encounter (Signed)
Spoke with patients neighbor about Newly diagnosed AF and patient needs to call cardiology office for appointment for new medication. Neighbor said she "will try to get him to come to door, that he will not listen to anyone". I thanked her for her help.

## 2018-05-11 NOTE — Telephone Encounter (Signed)
Patient is scheduled to see Dr. Rayann Heman on 06/02/18 at 9:30am to discuss ?AF on LINQ.

## 2018-05-13 ENCOUNTER — Other Ambulatory Visit: Payer: Self-pay | Admitting: Internal Medicine

## 2018-05-13 DIAGNOSIS — I10 Essential (primary) hypertension: Secondary | ICD-10-CM

## 2018-05-13 DIAGNOSIS — E2749 Other adrenocortical insufficiency: Secondary | ICD-10-CM

## 2018-05-13 DIAGNOSIS — M25571 Pain in right ankle and joints of right foot: Secondary | ICD-10-CM

## 2018-05-13 DIAGNOSIS — M25471 Effusion, right ankle: Secondary | ICD-10-CM

## 2018-06-02 ENCOUNTER — Telehealth: Payer: Self-pay

## 2018-06-02 ENCOUNTER — Ambulatory Visit (INDEPENDENT_AMBULATORY_CARE_PROVIDER_SITE_OTHER): Payer: Medicare Other | Admitting: Internal Medicine

## 2018-06-02 ENCOUNTER — Encounter (INDEPENDENT_AMBULATORY_CARE_PROVIDER_SITE_OTHER): Payer: Self-pay

## 2018-06-02 ENCOUNTER — Encounter: Payer: Self-pay | Admitting: Internal Medicine

## 2018-06-02 VITALS — BP 124/64 | HR 94 | Ht 74.0 in | Wt 219.0 lb

## 2018-06-02 DIAGNOSIS — G249 Dystonia, unspecified: Secondary | ICD-10-CM

## 2018-06-02 DIAGNOSIS — I639 Cerebral infarction, unspecified: Secondary | ICD-10-CM | POA: Diagnosis not present

## 2018-06-02 LAB — CUP PACEART INCLINIC DEVICE CHECK
Date Time Interrogation Session: 20190619103305
Implantable Pulse Generator Implant Date: 20170321

## 2018-06-02 MED ORDER — DOCUSATE SODIUM 100 MG PO CAPS: 100.0000 mg | ORAL_CAPSULE | Freq: Every day | ORAL | 3 refills | Status: DC

## 2018-06-02 MED ORDER — SENNA 8.6 MG PO TABS: 1.0000 | ORAL_TABLET | Freq: Two times a day (BID) | ORAL | 3 refills | Status: DC | PRN

## 2018-06-02 NOTE — Telephone Encounter (Signed)
Error

## 2018-06-02 NOTE — Patient Instructions (Addendum)
Medication Instructions:  Your physician has recommended you make the following change in your medication:  1.  Start taking colace 100 mg one tablet by mouth daily. 2.  Start taking Sennakot (Senna) 8.6 mg tablets-one tablet by mouth two times a day as needed.   Labwork: None ordered.  Testing/Procedures: None ordered.  Follow-Up: Your physician wants you to follow-up in: as needed with Dr. Rayann Heman.     Any Other Special Instructions Will Be Listed Below (If Applicable).  We have put in a referral for neurology with Dr. Leta Baptist.  His office will call you to set up an appointment.  If you need a refill on your cardiac medications before your next appointment, please call your pharmacy.

## 2018-06-02 NOTE — Progress Notes (Signed)
PCP: [name], MD Primary EP: Dr Rayann Heman  Derrick Hendrix is a 77 y.o. male who presents today for routine electrophysiology followup.  Since ILR implant, the patient reports doing reasonably well.  He has had progressive movement disorder.  He has not been evaluated or this.  His primary concern today is with constipation.  Today, he denies symptoms of palpitations, chest pain, shortness of breath,  lower extremity edema, dizziness, presyncope, or syncope.  The patient is otherwise without complaint today.   Past Medical History:  Diagnosis Date  . Chronic kidney disease (CKD), stage III (moderate) (HCC)   . Daily headache    "lately" (04/17/2016)  . Depression   . Heart murmur   . High cholesterol   . Hyperkalemia 08/2014  . Hypertension   . PAD (peripheral artery disease) ([place])   . Stroke ([place]) 03/2016   hx of PAD; j"ust lots of headaches since" (04/17/2016)   Past Surgical History:  Procedure Laterality Date  . ABDOMINAL AORTOGRAM W/LOWER EXTREMITY N/A 03/02/2017   Procedure: Abdominal Aortogram w/Lower Extremity;  Surgeon: Conrad Sedalia, MD;  Location: [facility];  Service: Cardiovascular;  Laterality: N/A;  . EP IMPLANTABLE DEVICE N/A 03/04/2016   Procedure: Loop Recorder Insertion;  Surgeon: Thompson Grayer, MD;  Location: [facility];  Service: Cardiovascular;  Laterality: N/A;  . PERIPHERAL VASCULAR INTERVENTION Right 03/06/2017   Procedure: Peripheral Vascular Intervention;  Surgeon: Elam Dutch, MD;  Location: [facility];  Service: Cardiovascular;  Laterality: Right;  SFA    ROS- all systems are reviewed and negatives except as per HPI above  Current Outpatient Medications  Medication Sig Dispense Refill  . acetaminophen (TYLENOL) 500 MG tablet Take 2 tablets (1,000 mg total) by mouth every 6 (six) hours as needed for mild pain (or Fever >/= 101). 30 tablet 0  . amLODipine (NORVASC) 10 MG tablet Take 1 tablet (10 mg total) by mouth daily. 90  tablet 0  . atorvastatin (LIPITOR) 80 MG tablet TAKE 1 TABLET (80 MG TOTAL) BY MOUTH DAILY. 90 tablet 5  . clonazePAM (KLONOPIN) 0.25 MG disintegrating tablet DISSOLVE 1 TABLET UNDER THE TONGUE THREE TIMES A DAY 90 tablet 1  . clopidogrel (PLAVIX) 75 MG tablet TAKE 1 TABLET (75 MG TOTAL) BY MOUTH DAILY. 90 tablet 3  . finasteride (PROSCAR) 5 MG tablet TAKE 1 TABLET (5 MG TOTAL) BY MOUTH DAILY. 90 tablet 5  . fludrocortisone (FLORINEF) 0.1 MG tablet Take 1 tablet (0.1 mg total) by mouth daily. 30 tablet 0  . furosemide (LASIX) 40 MG tablet Take 1 tablet (40 mg total) by mouth daily. PATIENT MUST BE SEEN IN DOCTOR'S OFFICE FOR ADDITIONAL REFILLS 90 tablet 0  . HYDROcodone-acetaminophen (NORCO/VICODIN) 5-325 MG tablet TAKE 1 TABLET BY MOUTH EVERY 6 HOURS AS NEEDED FOR MODERATE PAIN  0  . ipratropium (ATROVENT) 0.06 % nasal spray Place 2 sprays into the nose 3 (three) times daily. (Patient taking differently: Place 2 sprays into the nose daily as needed for rhinitis. ) 15 mL 2  . mupirocin ointment (BACTROBAN) 2 % Apply 1 application topically 2 (two) times daily. 22 g 0  . nitroGLYCERIN (NITROSTAT) 0.4 MG SL tablet Place 1 tablet (0.4 mg total) under the tongue every 5 (five) minutes x 3 doses as needed for chest pain. 30 tablet 3  . tamsulosin (FLOMAX) 0.4 MG CAPS capsule TAKE 1 CAPSULE (0.4 MG TOTAL) BY MOUTH DAILY AFTER SUPPER. 90 capsule 3  . TRADJENTA 5  MG TABS tablet TAKE 1 TABLET (5 MG TOTAL) BY MOUTH DAILY. 90 tablet 0   No current facility-administered medications for this visit.     Physical Exam: Vitals:   06/02/18 0932  BP: 124/64  Pulse: 94  Weight: 219 lb (99.3 kg)  Height: 6\' 2"  (1.88 m)    GEN- The patient is chronically ill and disheveled appearing, alert and oriented x 3 today.  + diffuse movement disorder is very prominent Head- normocephalic, atraumatic Eyes-  Sclera clear, conjunctiva pink Ears- hearing intact Oropharynx- clear Lungs- Clear to ausculation bilaterally,  normal work of breathing Heart- Regular rate and rhythm with frequent ectopy, no murmurs, rubs or gallops, PMI not laterally displaced GI- soft, NT, ND, + BS Extremities- no clubbing, cyanosis, or edema  Wt Readings from Last 3 Encounters:  06/02/18 219 lb (99.3 kg)  02/10/18 205 lb 8 oz (93.2 kg)  11/01/17 204 lb (92.5 kg)    EKG tracing ordered today is personally reviewed and shows sinus with very frequent premature atrial contractions, LVH  ILR is reviewed at length.  I do not see sustained afib.  Frequent atrial ectopy is observed  Assessment and Plan:  1. Atrial ectopy The patient has very frequent ectopy.  I have reviewed his ILR at length today.  I do not see sustained atrial fibrillation.  There are episodes of atach and nonsustained disorganized atrial activity Continue current management Continue monitoring  2. Movement disorder Refer back to neurology  3. Constipation Colace and senna encouraged  carelink Return as needed  Thompson Grayer MD, [facility] 06/02/2018 10:00 AM

## 2018-06-04 ENCOUNTER — Ambulatory Visit: Payer: Medicare Other | Admitting: *Deleted

## 2018-06-04 DIAGNOSIS — I639 Cerebral infarction, unspecified: Secondary | ICD-10-CM | POA: Diagnosis not present

## 2018-06-07 NOTE — Progress Notes (Signed)
Carelink Summary Report / Loop Recorder 

## 2018-06-09 ENCOUNTER — Other Ambulatory Visit: Payer: Self-pay | Admitting: Internal Medicine

## 2018-06-09 DIAGNOSIS — R339 Retention of urine, unspecified: Secondary | ICD-10-CM

## 2018-06-09 NOTE — Telephone Encounter (Signed)
Patient need appointment with PCP in the near future.

## 2018-06-15 ENCOUNTER — Other Ambulatory Visit: Payer: Self-pay | Admitting: Internal Medicine

## 2018-07-07 ENCOUNTER — Ambulatory Visit: Payer: Medicare Other | Admitting: *Deleted

## 2018-07-07 ENCOUNTER — Encounter: Payer: Self-pay | Admitting: *Deleted

## 2018-07-07 DIAGNOSIS — I639 Cerebral infarction, unspecified: Secondary | ICD-10-CM | POA: Diagnosis not present

## 2018-07-07 NOTE — Progress Notes (Signed)
Carelink Summary Report / Loop Recorder 

## 2018-07-16 ENCOUNTER — Telehealth: Payer: Self-pay | Admitting: *Deleted

## 2018-07-16 NOTE — Telephone Encounter (Signed)
LMOVM requesting manual Carelink transmission.  Received alert for 57 "AF" episodes, available ECG appears SR w/frequent atrial ectopy. Will review additional episodes when manual transmission received.

## 2018-07-20 ENCOUNTER — Other Ambulatory Visit: Payer: Self-pay | Admitting: Internal Medicine

## 2018-07-20 DIAGNOSIS — M25571 Pain in right ankle and joints of right foot: Secondary | ICD-10-CM

## 2018-07-20 DIAGNOSIS — I10 Essential (primary) hypertension: Secondary | ICD-10-CM

## 2018-07-20 DIAGNOSIS — M25471 Effusion, right ankle: Secondary | ICD-10-CM

## 2018-07-20 DIAGNOSIS — E2749 Other adrenocortical insufficiency: Secondary | ICD-10-CM

## 2018-07-22 ENCOUNTER — Encounter: Payer: Medicare Other | Admitting: Internal Medicine

## 2018-07-23 NOTE — Telephone Encounter (Signed)
Able to reach patient. Assisted him with sending a manual Carelink transmission for review of "AF" episodes that have not transmitted automatically. Advised that I will review transmission when it is received and call him back if any additional abnormalities. Patient verbalizes understanding and agreement with plan.

## 2018-07-27 NOTE — Telephone Encounter (Signed)
Manual transmission successfully received on 07/23/18. ECGs printed and placed in Dr. Otilio Connors folder for review.

## 2018-07-28 ENCOUNTER — Ambulatory Visit: Payer: Medicare Other | Admitting: Neurology

## 2018-08-12 ENCOUNTER — Other Ambulatory Visit: Payer: Self-pay | Admitting: Internal Medicine

## 2018-08-12 NOTE — Telephone Encounter (Signed)
called pt to schedule an appt - no answer; left message to call the office.

## 2018-08-18 ENCOUNTER — Other Ambulatory Visit: Payer: Self-pay | Admitting: Internal Medicine

## 2018-08-19 NOTE — Telephone Encounter (Signed)
Patient has been sch on 10/01/2018 with PCP.

## 2018-08-23 ENCOUNTER — Encounter: Payer: Self-pay | Admitting: Neurology

## 2018-08-23 ENCOUNTER — Telehealth: Payer: Self-pay

## 2018-08-23 ENCOUNTER — Other Ambulatory Visit: Payer: Self-pay | Admitting: Internal Medicine

## 2018-08-23 ENCOUNTER — Ambulatory Visit: Payer: Medicare Other | Admitting: Neurology

## 2018-08-23 NOTE — Telephone Encounter (Signed)
Patient no showed for appointment with Dr. Leonie Man on 08/23/2018. MB RN.

## 2018-08-26 ENCOUNTER — Telehealth: Payer: Self-pay | Admitting: *Deleted

## 2018-08-26 NOTE — Telephone Encounter (Signed)
STROKE~AF Research study: left detailed message/instructions on patients voice mail to complete a manuel transmission on his loop recorder.

## 2018-08-27 ENCOUNTER — Encounter: Payer: Self-pay | Admitting: *Deleted

## 2018-08-27 ENCOUNTER — Telehealth: Payer: Self-pay | Admitting: *Deleted

## 2018-08-27 DIAGNOSIS — Z006 Encounter for examination for normal comparison and control in clinical research program: Secondary | ICD-10-CM

## 2018-08-27 NOTE — Telephone Encounter (Signed)
STROKE~AF research study; Left message for patient to expect an updated ICF in mail (IRB date approved 02/23/2018). Informed patient the changes were data can be obtained via phone or manuel transmission if visit can not be completed in person. Instructed patient to sign at indicated tabs and mail back to research office in enclosed stamped envelope. Once I receive signed version I will mail copy to patient for home records. Research number left for any questions.

## 2018-08-27 NOTE — Progress Notes (Addendum)
STROKE ~ AF Month 30 visit no CP, no medication changes

## 2018-09-06 ENCOUNTER — Telehealth: Payer: Self-pay | Admitting: *Deleted

## 2018-09-06 NOTE — Telephone Encounter (Signed)
LVMOM regarding sending a manual transmission to review 53 "AF" episodes. Available ECGs appear AT and nonsustained disorganized atrial ectopy. [facility] phone number to call back with questions or concerns.

## 2018-09-08 ENCOUNTER — Other Ambulatory Visit: Payer: Self-pay | Admitting: Internal Medicine

## 2018-09-09 NOTE — Telephone Encounter (Signed)
Next appt scheduled 10/18 with PCP.

## 2018-09-10 NOTE — Telephone Encounter (Signed)
Reviewed with Dr. Rayann Heman, ECG reveals AFib times 2 hours. See Dr. Jackalyn Lombard message.

## 2018-09-10 NOTE — Telephone Encounter (Signed)
Spoke with patient regarding sending a manual transmission. Patient verbalized understanding.

## 2018-09-10 NOTE — Telephone Encounter (Signed)
Manual transmission received. Will review with Dr. Rayann Heman.

## 2018-09-11 NOTE — Telephone Encounter (Signed)
This patient is in stroke afib trial. He needs appointment with AFIB clinic to start anticoagulation and with Cardiovascular research as well

## 2018-09-13 ENCOUNTER — Other Ambulatory Visit: Payer: Self-pay | Admitting: Internal Medicine

## 2018-09-15 NOTE — Telephone Encounter (Signed)
LMOVM requesting call back to the DC. Gave direct number for return call. Will setup in AF clinic next available.

## 2018-09-15 NOTE — Telephone Encounter (Signed)
-----   Message from Thompson Grayer, MD sent at 09/14/2018 10:03 AM EDT ----- Luetta Nutting, please initiate anticoagulation as per Dr Leonie Man and arrange follow-up in anticoagulation clinic.   ----- Message ----- From: Garvin Fila, MD Sent: 09/12/2018  11:26 AM EDT To: Thompson Grayer, MD  Eliquis if his renal function permits else warfarin ----- Message ----- From: Thompson Grayer, MD Sent: 09/10/2018   4:38 PM EDT To: Garvin Fila, MD, Karilyn Cota, RN  afib documented on ILR lasting 2 hours He is a Stroke AF patient with known vascular disease on antiplatelet therapy.  Would you please look at his chart and advise anticoagulation, Pramod?  Thanks!  Jeneen Rinks

## 2018-09-17 NOTE — Telephone Encounter (Signed)
LMOVM requesting call back to the [facility]. Gave direct DC number for return call.

## 2018-09-24 NOTE — Telephone Encounter (Signed)
LVMOM requesting call back to the device clinic on home phone number, mobile phone number, and sister Feliciana Rossetti mobile number. [facility] phone number.

## 2018-09-26 NOTE — Telephone Encounter (Signed)
Unsuccessful attempts to contact patient noted.  I will forward to research team.

## 2018-09-27 ENCOUNTER — Telehealth: Payer: Self-pay | Admitting: *Deleted

## 2018-09-27 NOTE — Telephone Encounter (Signed)
Thanks. Try sending written  Registered letter to listed address with proof of delivery

## 2018-09-27 NOTE — Telephone Encounter (Signed)
STROKE~AF research study: Called patient @ [phone] Explained to him that he need to make an appointment with Roderic Palau in the AFIB clinic # [phone]. Instructed patient that the loop monitor detected Af and that he needed to be medicated and the importance of this appointment. Patient thought he already had an appointment this Friday, but he does not. Patient tried to write the AF clinic # down and we got disconnected 3 times. I called patient back and left a detailed message. I also Spoke with patiens Neighbor "Felicia" (whom I have spoken with before) [phone]. She has agreed to relay the message and get him to schedule appointment.

## 2018-09-28 NOTE — Telephone Encounter (Signed)
Ok thanks agree with plan

## 2018-09-28 NOTE — Telephone Encounter (Signed)
appt made per pt request for 10/05/18 @9am . Instructions to clinic given.

## 2018-09-29 ENCOUNTER — Other Ambulatory Visit: Payer: Self-pay | Admitting: Internal Medicine

## 2018-09-29 ENCOUNTER — Telehealth: Payer: Self-pay | Admitting: Cardiology

## 2018-09-29 NOTE — Telephone Encounter (Signed)
Next appt scheduled  10/18 with PCP.

## 2018-09-29 NOTE — Telephone Encounter (Signed)
LMOVM requesting that pt send manual transmission b/c home monitor has not updated in at least 14 days.    

## 2018-09-29 NOTE — Telephone Encounter (Signed)
Next appt scheduled 10/18 with PCP.

## 2018-10-01 ENCOUNTER — Encounter: Payer: Self-pay | Admitting: Internal Medicine

## 2018-10-01 ENCOUNTER — Other Ambulatory Visit: Payer: Self-pay

## 2018-10-01 ENCOUNTER — Ambulatory Visit (INDEPENDENT_AMBULATORY_CARE_PROVIDER_SITE_OTHER): Payer: Medicare Other | Admitting: Internal Medicine

## 2018-10-01 VITALS — BP 146/76 | HR 86 | Temp 98.0°F | Ht 74.0 in | Wt 215.3 lb

## 2018-10-01 DIAGNOSIS — E118 Type 2 diabetes mellitus with unspecified complications: Secondary | ICD-10-CM | POA: Diagnosis not present

## 2018-10-01 DIAGNOSIS — Z23 Encounter for immunization: Secondary | ICD-10-CM

## 2018-10-01 DIAGNOSIS — I129 Hypertensive chronic kidney disease with stage 1 through stage 4 chronic kidney disease, or unspecified chronic kidney disease: Secondary | ICD-10-CM | POA: Diagnosis not present

## 2018-10-01 DIAGNOSIS — Z7901 Long term (current) use of anticoagulants: Secondary | ICD-10-CM

## 2018-10-01 DIAGNOSIS — I491 Atrial premature depolarization: Secondary | ICD-10-CM | POA: Diagnosis not present

## 2018-10-01 DIAGNOSIS — I69398 Other sequelae of cerebral infarction: Secondary | ICD-10-CM

## 2018-10-01 DIAGNOSIS — E2749 Other adrenocortical insufficiency: Secondary | ICD-10-CM

## 2018-10-01 DIAGNOSIS — E1122 Type 2 diabetes mellitus with diabetic chronic kidney disease: Secondary | ICD-10-CM | POA: Diagnosis not present

## 2018-10-01 DIAGNOSIS — E1151 Type 2 diabetes mellitus with diabetic peripheral angiopathy without gangrene: Secondary | ICD-10-CM

## 2018-10-01 DIAGNOSIS — Z87891 Personal history of nicotine dependence: Secondary | ICD-10-CM

## 2018-10-01 DIAGNOSIS — G255 Other chorea: Secondary | ICD-10-CM

## 2018-10-01 DIAGNOSIS — N183 Chronic kidney disease, stage 3 unspecified: Secondary | ICD-10-CM

## 2018-10-01 DIAGNOSIS — Z79899 Other long term (current) drug therapy: Secondary | ICD-10-CM

## 2018-10-01 DIAGNOSIS — N179 Acute kidney failure, unspecified: Secondary | ICD-10-CM | POA: Diagnosis not present

## 2018-10-01 DIAGNOSIS — E875 Hyperkalemia: Secondary | ICD-10-CM | POA: Diagnosis not present

## 2018-10-01 DIAGNOSIS — B351 Tinea unguium: Secondary | ICD-10-CM

## 2018-10-01 DIAGNOSIS — I10 Essential (primary) hypertension: Secondary | ICD-10-CM

## 2018-10-01 LAB — POCT GLYCOSYLATED HEMOGLOBIN (HGB A1C): HEMOGLOBIN A1C: 5.7 % — AB (ref 4.0–5.6)

## 2018-10-01 LAB — GLUCOSE, CAPILLARY: Glucose-Capillary: 84 mg/dL (ref 70–99)

## 2018-10-01 NOTE — Patient Instructions (Addendum)
Derrick Hendrix,   It was a pleasure taking care of you today. Your blood pressure was high today so I am adding another medication called Lisinopril 5mg  which you will take once a day.   Also, your blood sugar was normal at 84 but the A1C was low so I am sending another blood work to accurately confirm that.   I am also making you an appointment to see out diabetes specialist to have an eye exam. Depending on all your blood work, I will ask you to stop some of your medications or offer you a combination pill   Take care  Dr. Eileen Stanford

## 2018-10-01 NOTE — Progress Notes (Signed)
   CC: Follow-up type 2 diabetes mellitus  HPI:  [name] is a 77 y.o. with medical history significant for cerebellar stroke with resultant hemiballism, hypertension, diabetes mellitus, atrial ectopy, peripheral arterial disease, chronic kidney disease presenting for follow up of chronic medical problems.   Please see problem based charting for further details.   Past Medical History:  Diagnosis Date  . Chronic kidney disease (CKD), stage III (moderate) (HCC)   . Constipation 11/07/2014  . Daily headache    "lately" (04/17/2016)  . Depression   . Dizziness 03/07/2016  . Headache 06/26/2014  . Heart murmur   . High cholesterol   . Hyperkalemia 08/2014  . Hyperkalemia 10/20/2014  . Hyperlipemia 11/11/2016  . Hypertension   . Neuropathic pain 02/06/2015  . PAD (peripheral artery disease) ([place])   . Paronychia of great toe, right   . Preop cardiovascular exam   . Right foot pain 02/09/2017  . Stable angina ([place]) 06/27/2014  . Stroke ([place]) 03/2016   hx of PAD; j"ust lots of headaches since" (04/17/2016)  . Vitamin B12 deficiency 06/27/2014  . Weight loss, unintentional 06/27/2014   Review of Systems:  As per HPI  Physical Exam:  Vitals:   10/01/18 1317 10/01/18 1337  BP: (!) 153/60 (!) 146/76  Pulse: 96 86  Temp: 98 F (36.7 C)   TempSrc: Oral   SpO2: 98%   Weight: 215 lb 4.8 oz (97.7 kg)   Height: 6\' 2"  (1.88 m)    Physical Exam  Constitutional: He is well-developed, well-nourished, and in no distress. No distress.  HENT:  Head: Normocephalic and atraumatic.  Cardiovascular: Normal rate, regular rhythm and normal heart sounds. Exam reveals no friction rub.  No murmur heard. Pulmonary/Chest: Effort normal and breath sounds normal.  Abdominal: Soft. Bowel sounds are normal.  Neurological: He is alert. No cranial nerve deficit.  Intermittent jerky motion of left upper and lower extremities that is typically worse with initiation of movement. Otherwise CN II-XII intact    Skin: Skin is warm. He is not diaphoretic.  Psychiatric: Memory and affect normal.    Assessment & Plan:   See Encounters Tab for problem based charting.  Patient discussed with Dr. Rebeca Alert

## 2018-10-02 LAB — BMP8+ANION GAP
ANION GAP: 17 mmol/L (ref 10.0–18.0)
BUN/Creatinine Ratio: 21 (ref 10–24)
BUN: 50 mg/dL — AB (ref 8–27)
CALCIUM: 9.3 mg/dL (ref 8.6–10.2)
CO2: 17 mmol/L — ABNORMAL LOW (ref 20–29)
CREATININE: 2.36 mg/dL — AB (ref 0.76–1.27)
Chloride: 107 mmol/L — ABNORMAL HIGH (ref 96–106)
GFR calc Af Amer: 29 mL/min/{1.73_m2} — ABNORMAL LOW (ref >59)
GFR calc non Af Amer: 25 mL/min/{1.73_m2} — ABNORMAL LOW (ref >59)
Glucose: 95 mg/dL (ref 65–99)
Potassium: 5.7 mmol/L — ABNORMAL HIGH (ref 3.5–5.2)
SODIUM: 141 mmol/L (ref 134–144)

## 2018-10-02 LAB — HEMOGLOBIN A1C
Est. average glucose Bld gHb Est-mCnc: 117 mg/dL
HEMOGLOBIN A1C: 5.7 % — AB (ref 4.8–5.6)

## 2018-10-02 LAB — MICROALBUMIN / CREATININE URINE RATIO
CREATININE, UR: 110.7 mg/dL
Microalb/Creat Ratio: 1095 mg/g creat — ABNORMAL HIGH (ref 0.0–30.0)
Microalbumin, Urine: 1212.2 ug/mL

## 2018-10-02 MED ORDER — LISINOPRIL 5 MG PO TABS: 5.0000 mg | ORAL_TABLET | Freq: Every day | ORAL | 3 refills | Status: DC

## 2018-10-03 ENCOUNTER — Encounter: Payer: Self-pay | Admitting: Internal Medicine

## 2018-10-03 ENCOUNTER — Telehealth: Payer: Self-pay | Admitting: Internal Medicine

## 2018-10-03 NOTE — Assessment & Plan Note (Signed)
Patient was previously taking Fludrocortisone 0.1mg  BID which was decreased to 0.1mg  daily following his hospital admission in 10/2017. This was in part due to his chronic elevated blood pressure. I am quite unsure if patient is complaint with medication though he reports that he takes all his medications.   Plan: -Increase Fludrocortisone to 0.1mg  BID  Addendum: BMP does reveal hyperkalemia of 5.7. This is most likely secondary to hypo-aldosteronism vs rhabdomyolysis from chronic jerky motion of left upper and lower extremities. Also noted on BMP is an acute on chronic kidney injury, most likely pre-renal. His metabolic acidosis can also be explained by his low aldosterone state.

## 2018-10-03 NOTE — Assessment & Plan Note (Signed)
This is unfortunately secondary to cerebellar infarction which occurred in 2017. He has intermittent jerky motions which seems to be manageable with Klonipin 0.25mg  TID. He presented to the emergency department on 10/2017 with worsening jerky movement of the left arm and leg after running out of Klonipin. He reports that recently he has been increasing his use of Klonipin especially at night time when he typically takes an extra dose.   Plan: -Continue Klonipin 0.25mg  TID  -Can take an extra dose at bed time prn when symptoms are worse.

## 2018-10-03 NOTE — Assessment & Plan Note (Signed)
Reports compliance to medication and denies any symptoms of hypoglycemia though he does not monitor BGs at home. His last A1C was 11.7% on 10/2017. A1C at clinic today was 5.7% on both POC and lab sent. He only takes Trajenta 5mg . Though his goal A1C is <7%, I am concerned about hypoglycemia and will ask that he hold Trajenta for now. On review of his labs, he is noted to have microalbinuria with Micro-albumin to Cr ratio of  >1000.   Plan: -Discontinue Trajenta -Start ACE-I or ARB once renal function improves.

## 2018-10-03 NOTE — Assessment & Plan Note (Signed)
Uncontrolled. Current antihypertensive is only Amlodipine 10mg  daily. HE denies chest pain, palpitation, DOE, headaches, blurry vision  BP Readings from Last 3 Encounters:  10/01/18 (!) 146/76  06/02/18 124/64  04/02/18 (!) 148/80   Given recent finding of microalbuminuria on lab, he will benefit from addition of ACE-I or ARB however will hold until renal function improves  Plan: -Continue Amlodipine 10mg   -Add ACE-I or ARB once renal function improves.

## 2018-10-03 NOTE — Progress Notes (Signed)
On review of patient's labs post-clinic hours,  BMP reveal hyperkalemia of 5.7. This is most likely secondary to hypo-aldosteronism given his history of Hyporeninemic hypoaldosteronism vs rhabdomyolysis from chronic jerky motion of left upper and lower extremities.   Also noted on BMP is an acute on chronic kidney injury, most likely pre-renal as BMP shows increased BUN/Cr. His metabolic acidosis can also be explained by his low aldosterone state.   An attempt will be made to reach out to the patient to seek immediate medical attention as he is at risk of an arrhythmia and worsening kidney injury if not appropriately addressed.   Several attempts were made to patient and neighbor but was unsuccessful. I will send message to make [facility] appointment on Monday for BMP re-check

## 2018-10-03 NOTE — Telephone Encounter (Signed)
On review of patient's labs post-clinic hours,  BMP reveal hyperkalemia of 5.7. This is most likely secondary to hypo-aldosteronism given his history of Hyporeninemic hypoaldosteronism vs rhabdomyolysis from chronic jerky motion of left upper and lower extremities.   Also noted on BMP is an acute on chronic kidney injury, most likely pre-renal as BMP shows increased BUN/Cr. His metabolic acidosis can also be explained by his low aldosterone state.   An attempt will be made to reach out to the patient several times as well as his neighbor to seek immediate medical attention as he is at risk of an arrhythmia and worsening kidney injury if not appropriately addressed.   I was not able to reach patient nor his neighbor. He lives alone. I will send a message to front desk pool to schedule [facility] appointment for BMP re-check and also start him back on Kayexalate.

## 2018-10-05 ENCOUNTER — Encounter (HOSPITAL_COMMUNITY): Payer: Self-pay | Admitting: Nurse Practitioner

## 2018-10-05 ENCOUNTER — Ambulatory Visit (HOSPITAL_BASED_OUTPATIENT_CLINIC_OR_DEPARTMENT_OTHER)
Admission: RE | Admit: 2018-10-05 | Discharge: 2018-10-05 | Disposition: A | Discharge status: Home / Self Care | Payer: Medicare Other | Source: Ambulatory Visit | Attending: Nurse Practitioner | Admitting: Nurse Practitioner

## 2018-10-05 ENCOUNTER — Observation Stay (HOSPITAL_COMMUNITY): Payer: Medicare Other

## 2018-10-05 ENCOUNTER — Ambulatory Visit: Payer: Medicare Other

## 2018-10-05 ENCOUNTER — Telehealth: Payer: Self-pay | Admitting: *Deleted

## 2018-10-05 ENCOUNTER — Observation Stay (HOSPITAL_COMMUNITY)
Admission: EM | Admit: 2018-10-05 | Discharge: 2018-10-06 | Disposition: A | Discharge status: Home / Self Care | Payer: Medicare Other | Attending: Internal Medicine | Admitting: Internal Medicine

## 2018-10-05 ENCOUNTER — Other Ambulatory Visit: Payer: Self-pay

## 2018-10-05 ENCOUNTER — Encounter (HOSPITAL_COMMUNITY): Payer: Self-pay

## 2018-10-05 ENCOUNTER — Encounter: Payer: Self-pay | Admitting: *Deleted

## 2018-10-05 VITALS — BP 118/72 | HR 98 | Ht 74.0 in | Wt 213.0 lb

## 2018-10-05 DIAGNOSIS — Z87891 Personal history of nicotine dependence: Secondary | ICD-10-CM

## 2018-10-05 DIAGNOSIS — E7800 Pure hypercholesterolemia, unspecified: Secondary | ICD-10-CM

## 2018-10-05 DIAGNOSIS — I2510 Atherosclerotic heart disease of native coronary artery without angina pectoris: Secondary | ICD-10-CM | POA: Diagnosis not present

## 2018-10-05 DIAGNOSIS — Z8249 Family history of ischemic heart disease and other diseases of the circulatory system: Secondary | ICD-10-CM | POA: Insufficient documentation

## 2018-10-05 DIAGNOSIS — E538 Deficiency of other specified B group vitamins: Secondary | ICD-10-CM

## 2018-10-05 DIAGNOSIS — Z7901 Long term (current) use of anticoagulants: Secondary | ICD-10-CM

## 2018-10-05 DIAGNOSIS — E875 Hyperkalemia: Secondary | ICD-10-CM

## 2018-10-05 DIAGNOSIS — E1122 Type 2 diabetes mellitus with diabetic chronic kidney disease: Secondary | ICD-10-CM | POA: Diagnosis not present

## 2018-10-05 DIAGNOSIS — N19 Unspecified kidney failure: Secondary | ICD-10-CM | POA: Diagnosis not present

## 2018-10-05 DIAGNOSIS — E1151 Type 2 diabetes mellitus with diabetic peripheral angiopathy without gangrene: Secondary | ICD-10-CM | POA: Diagnosis not present

## 2018-10-05 DIAGNOSIS — N183 Chronic kidney disease, stage 3 (moderate): Secondary | ICD-10-CM

## 2018-10-05 DIAGNOSIS — Z8673 Personal history of transient ischemic attack (TIA), and cerebral infarction without residual deficits: Secondary | ICD-10-CM

## 2018-10-05 DIAGNOSIS — I10 Essential (primary) hypertension: Secondary | ICD-10-CM | POA: Diagnosis not present

## 2018-10-05 DIAGNOSIS — Z79899 Other long term (current) drug therapy: Secondary | ICD-10-CM

## 2018-10-05 DIAGNOSIS — E2740 Unspecified adrenocortical insufficiency: Secondary | ICD-10-CM | POA: Insufficient documentation

## 2018-10-05 DIAGNOSIS — R350 Frequency of micturition: Secondary | ICD-10-CM | POA: Insufficient documentation

## 2018-10-05 DIAGNOSIS — G629 Polyneuropathy, unspecified: Secondary | ICD-10-CM | POA: Insufficient documentation

## 2018-10-05 DIAGNOSIS — F329 Major depressive disorder, single episode, unspecified: Secondary | ICD-10-CM | POA: Insufficient documentation

## 2018-10-05 DIAGNOSIS — Z886 Allergy status to analgesic agent status: Secondary | ICD-10-CM | POA: Insufficient documentation

## 2018-10-05 DIAGNOSIS — I480 Paroxysmal atrial fibrillation: Secondary | ICD-10-CM | POA: Diagnosis not present

## 2018-10-05 DIAGNOSIS — I739 Peripheral vascular disease, unspecified: Secondary | ICD-10-CM

## 2018-10-05 DIAGNOSIS — R9431 Abnormal electrocardiogram [ECG] [EKG]: Secondary | ICD-10-CM

## 2018-10-05 DIAGNOSIS — N401 Enlarged prostate with lower urinary tract symptoms: Secondary | ICD-10-CM | POA: Insufficient documentation

## 2018-10-05 DIAGNOSIS — Z7984 Long term (current) use of oral hypoglycemic drugs: Secondary | ICD-10-CM | POA: Insufficient documentation

## 2018-10-05 DIAGNOSIS — I129 Hypertensive chronic kidney disease with stage 1 through stage 4 chronic kidney disease, or unspecified chronic kidney disease: Secondary | ICD-10-CM

## 2018-10-05 DIAGNOSIS — N281 Cyst of kidney, acquired: Secondary | ICD-10-CM | POA: Diagnosis not present

## 2018-10-05 LAB — CBC
HEMATOCRIT: 39 % (ref 39.0–52.0)
HEMOGLOBIN: 11.6 g/dL — AB (ref 13.0–17.0)
MCH: 28.9 pg (ref 26.0–34.0)
MCHC: 29.7 g/dL — ABNORMAL LOW (ref 30.0–36.0)
MCV: 97.3 fL (ref 80.0–100.0)
Platelets: 318 10*3/uL (ref 150–400)
RBC: 4.01 MIL/uL — ABNORMAL LOW (ref 4.22–5.81)
RDW: 14.9 % (ref 11.5–15.5)
WBC: 8.6 10*3/uL (ref 4.0–10.5)
nRBC: 0 % (ref 0.0–0.2)

## 2018-10-05 LAB — URINALYSIS, ROUTINE W REFLEX MICROSCOPIC
Bilirubin Urine: NEGATIVE
GLUCOSE, UA: NEGATIVE mg/dL
Hgb urine dipstick: NEGATIVE
Ketones, ur: NEGATIVE mg/dL
Leukocytes, UA: NEGATIVE
Nitrite: NEGATIVE
PH: 5 (ref 5.0–8.0)
Protein, ur: 100 mg/dL — AB
SPECIFIC GRAVITY, URINE: 1.009 (ref 1.005–1.030)

## 2018-10-05 LAB — CBG MONITORING, ED
GLUCOSE-CAPILLARY: 82 mg/dL (ref 70–99)
Glucose-Capillary: 162 mg/dL — ABNORMAL HIGH (ref 70–99)
Glucose-Capillary: 59 mg/dL — ABNORMAL LOW (ref 70–99)

## 2018-10-05 LAB — RENAL FUNCTION PANEL
ALBUMIN: 3.6 g/dL (ref 3.5–5.0)
ANION GAP: 6 (ref 5–15)
BUN: 47 mg/dL — ABNORMAL HIGH (ref 8–23)
CO2: 20 mmol/L — ABNORMAL LOW (ref 22–32)
Calcium: 8.9 mg/dL (ref 8.9–10.3)
Chloride: 114 mmol/L — ABNORMAL HIGH (ref 98–111)
Creatinine, Ser: 2.44 mg/dL — ABNORMAL HIGH (ref 0.61–1.24)
GFR, EST AFRICAN AMERICAN: 27 mL/min — AB (ref >60)
GFR, EST NON AFRICAN AMERICAN: 24 mL/min — AB (ref >60)
Glucose, Bld: 130 mg/dL — ABNORMAL HIGH (ref 70–99)
PHOSPHORUS: 3.5 mg/dL (ref 2.5–4.6)
Potassium: 4.9 mmol/L (ref 3.5–5.1)
Sodium: 140 mmol/L (ref 135–145)

## 2018-10-05 LAB — BASIC METABOLIC PANEL
Anion gap: 6 (ref 5–15)
BUN: 47 mg/dL — AB (ref 8–23)
CALCIUM: 9.2 mg/dL (ref 8.9–10.3)
CHLORIDE: 115 mmol/L — AB (ref 98–111)
CO2: 19 mmol/L — AB (ref 22–32)
Creatinine, Ser: 2.35 mg/dL — ABNORMAL HIGH (ref 0.61–1.24)
GFR calc Af Amer: 29 mL/min — ABNORMAL LOW (ref >60)
GFR calc non Af Amer: 25 mL/min — ABNORMAL LOW (ref >60)
Glucose, Bld: 100 mg/dL — ABNORMAL HIGH (ref 70–99)
Potassium: 6.2 mmol/L — ABNORMAL HIGH (ref 3.5–5.1)
Sodium: 140 mmol/L (ref 135–145)

## 2018-10-05 LAB — GLUCOSE, CAPILLARY
GLUCOSE-CAPILLARY: 104 mg/dL — AB (ref 70–99)
GLUCOSE-CAPILLARY: 128 mg/dL — AB (ref 70–99)

## 2018-10-05 LAB — CK: Total CK: 566 U/L — ABNORMAL HIGH (ref 49–397)

## 2018-10-05 LAB — POTASSIUM: Potassium: 4.9 mmol/L (ref 3.5–5.1)

## 2018-10-05 MED ORDER — INSULIN ASPART 100 UNIT/ML ~~LOC~~ SOLN
10.0000 [IU] | Freq: Once | SUBCUTANEOUS | Status: DC
  Filled 2018-10-05: qty 1

## 2018-10-05 MED ORDER — ACETAMINOPHEN 325 MG PO TABS: 650.0000 mg | ORAL_TABLET | Freq: Four times a day (QID) | ORAL | Status: DC | PRN

## 2018-10-05 MED ORDER — APIXABAN 2.5 MG PO TABS
2.5000 mg | ORAL_TABLET | Freq: Two times a day (BID) | ORAL | Status: DC
  Administered 2018-10-05 – 2018-10-06 (×2): 2.5 mg via ORAL
  Filled 2018-10-05 (×4): qty 1

## 2018-10-05 MED ORDER — CALCIUM GLUCONATE-NACL 1-0.675 GM/50ML-% IV SOLN
1.0000 g | Freq: Once | INTRAVENOUS | Status: AC
  Administered 2018-10-05: 1000 mg via INTRAVENOUS
  Filled 2018-10-05: qty 50

## 2018-10-05 MED ORDER — LACTATED RINGERS IV SOLN
INTRAVENOUS | Status: DC
  Administered 2018-10-05: 18:00:00 via INTRAVENOUS

## 2018-10-05 MED ORDER — DEXTROSE 50 % IV SOLN
1.0000 | Freq: Once | INTRAVENOUS | Status: AC
  Administered 2018-10-05: 50 mL via INTRAVENOUS
  Filled 2018-10-05: qty 50

## 2018-10-05 MED ORDER — INSULIN ASPART 100 UNIT/ML ~~LOC~~ SOLN: 0.0000 [IU] | Freq: Three times a day (TID) | SUBCUTANEOUS | Status: DC

## 2018-10-05 MED ORDER — FLUDROCORTISONE ACETATE 0.1 MG PO TABS
0.1000 mg | ORAL_TABLET | Freq: Every day | ORAL | Status: DC
  Filled 2018-10-05: qty 1

## 2018-10-05 MED ORDER — DEXTROSE 50 % IV SOLN
INTRAVENOUS | Status: AC
  Administered 2018-10-05: 50 mL
  Filled 2018-10-05: qty 50

## 2018-10-05 MED ORDER — CLONAZEPAM 0.25 MG PO TBDP
0.2500 mg | ORAL_TABLET | Freq: Three times a day (TID) | ORAL | Status: DC
  Administered 2018-10-05 – 2018-10-06 (×3): 0.25 mg via ORAL
  Filled 2018-10-05: qty 1
  Filled 2018-10-05 (×2): qty 2

## 2018-10-05 MED ORDER — SENNOSIDES-DOCUSATE SODIUM 8.6-50 MG PO TABS: 1.0000 | ORAL_TABLET | Freq: Every evening | ORAL | Status: DC | PRN

## 2018-10-05 MED ORDER — AMLODIPINE BESYLATE 10 MG PO TABS
10.0000 mg | ORAL_TABLET | Freq: Every day | ORAL | Status: DC
  Administered 2018-10-05 – 2018-10-06 (×2): 10 mg via ORAL
  Filled 2018-10-05 (×2): qty 1

## 2018-10-05 MED ORDER — SODIUM CHLORIDE 0.9 % IV SOLN
Freq: Once | INTRAVENOUS | Status: AC
  Administered 2018-10-05: 13:00:00 via INTRAVENOUS

## 2018-10-05 MED ORDER — INSULIN ASPART 100 UNIT/ML ~~LOC~~ SOLN
10.0000 [IU] | Freq: Once | SUBCUTANEOUS | Status: AC
  Administered 2018-10-05: 10 [IU] via INTRAVENOUS

## 2018-10-05 MED ORDER — ATORVASTATIN CALCIUM 80 MG PO TABS
80.0000 mg | ORAL_TABLET | Freq: Every day | ORAL | Status: DC
  Administered 2018-10-05 – 2018-10-06 (×2): 80 mg via ORAL
  Filled 2018-10-05 (×2): qty 1

## 2018-10-05 MED ORDER — SODIUM POLYSTYRENE SULFONATE 15 GM/60ML PO SUSP
15.0000 g | Freq: Two times a day (BID) | ORAL | Status: DC
  Administered 2018-10-06: 15 g via ORAL
  Filled 2018-10-05 (×2): qty 60

## 2018-10-05 MED ORDER — FUROSEMIDE 10 MG/ML IJ SOLN
40.0000 mg | Freq: Once | INTRAMUSCULAR | Status: AC
  Administered 2018-10-05: 40 mg via INTRAVENOUS
  Filled 2018-10-05: qty 4

## 2018-10-05 MED ORDER — APIXABAN 2.5 MG PO TABS: 2.5000 mg | ORAL_TABLET | Freq: Two times a day (BID) | ORAL | 6 refills | Status: DC

## 2018-10-05 MED ORDER — ACETAMINOPHEN 650 MG RE SUPP: 650.0000 mg | Freq: Four times a day (QID) | RECTAL | Status: DC | PRN

## 2018-10-05 MED ORDER — INSULIN ASPART 100 UNIT/ML ~~LOC~~ SOLN: 0.0000 [IU] | Freq: Every day | SUBCUTANEOUS | Status: DC

## 2018-10-05 MED ORDER — FLUDROCORTISONE ACETATE 0.1 MG PO TABS
0.1000 mg | ORAL_TABLET | Freq: Every day | ORAL | Status: DC
  Administered 2018-10-05 – 2018-10-06 (×2): 0.1 mg via ORAL
  Filled 2018-10-05 (×2): qty 1

## 2018-10-05 MED ORDER — TAMSULOSIN HCL 0.4 MG PO CAPS: 0.4000 mg | ORAL_CAPSULE | Freq: Every day | ORAL | Status: DC

## 2018-10-05 NOTE — ED Notes (Signed)
Admitting MD aware of hypoglycemia and d-50 administration

## 2018-10-05 NOTE — Telephone Encounter (Signed)
Taken via wh/ch to ED, report given to chg nurse

## 2018-10-05 NOTE — H&P (Signed)
Date: 10/05/2018               Patient Name:  Derrick Hendrix MRN: [id number]  DOB: 22-Nov-1941 Age / Sex: 77 y.o., male   PCP: [name], MD         Medical Service: Internal Medicine Teaching Service         Attending Physician: Dr. Aldine Contes, MD    First Contact: Dr. Lonia Skinner Pager: [phone]  Second Contact: Dr. Kalman Shan Pager: [phone]       After Hours (After 5p/  First Contact Pager: [phone]  weekends / holidays): Second Contact Pager: [phone]   Chief Complaint: Hyperkalemia  History of Present Illness: This is a 77 year old male with a history of hyporeninemic hypoaldosteronism(on Florinef), BPH, CKD3, PAD, cerebellar stroke with resultant hemiballisum, hypertension, diabetes mellitus,paroxsymal atrial fibrillation (switched from Plavix to Eliquis today) presenting for an abnormal lab value.  Reports that on Friday he was seen in the clinic and had some labs drawn, he was sent home and he was called on Sunday and asked to return for repeat labs.  He came in this morning to the Coumadin clinic and had repeat labs and he was found to be hyperkalemic up to 6.2 and he was sent to the ED. he denies any muscle aches, weakness, nausea, tingling sensation, or rotations.  Reports that he takes his medications as prescribed however does not know what he takes since he gets it through the pharmacy.  His only complaint today is constipation, he reports that he has not gone for about 5 to 6 days and he normally has bowel movement about every 4 days.  Notes that he urinates about every 15 minutes, and he does not completely empty his bladder, but denies any straining, hematuria or dysuria.  Per chart review patient has a history of hyporeninemic hypoaldosteronism, labs from 08/14/2014 showed a PMR LC/MS/MS of 0.19, aldosterone/PRA ratio unable to calculate due to aldosterone being too low, and aldosterone of <1.  He had similar potassium values in March, April, and May  2017.  Was seen in the clinic on 10/01/2018, fludrocortisone was increased to 0.1 mg twice daily and Tradjenta was discontinued due to risk of hypoglycemia, he was found to have a potassium of 5.7 thought to be secondary to hyporeninemic hypoaldosteronism versus rhabdomyolysis.   In the ED he was found to have temperature of 98.3, RR 26, HR 96, BP 153/60.  BMP showed a sodium 140, potassium 6.2, chloride 115, bicarb 19, BUN 47 and creatinine 2.35.  CBC showed WBC 8.6, hemoglobin 11.6, platelets 318.  EKG showed normal sinus rhythm with no peak T waves or prolonged QRS or PR intervals.  He was given some normal saline.  Patient was admitted to internal medicine.  We ordered calcium gluconate to be given.  Meds:  Scheduled Meds: Continuous Infusions: . calcium gluconate 1,000 mg (10/05/18 1413)   PRN Meds:.  No outpatient medications have been marked as taking for the 10/05/18 encounter [facility] Encounter).     Allergies: Allergies as of 10/05/2018 - Review Complete 10/05/2018  Allergen Reaction Noted  . Aspirin Other (See Comments) 08/24/2014   Past Medical History:  Diagnosis Date  . Chronic kidney disease (CKD), stage III (moderate) (HCC)   . Constipation 11/07/2014  . Daily headache    "lately" (04/17/2016)  . Depression   . Dizziness 03/07/2016  . Headache 06/26/2014  . Heart murmur   . High cholesterol   .  Hyperkalemia 08/2014  . Hyperkalemia 10/20/2014  . Hyperlipemia 11/11/2016  . Hypertension   . Neuropathic pain 02/06/2015  . PAD (peripheral artery disease) ([place])   . Paronychia of great toe, right   . Preop cardiovascular exam   . Right foot pain 02/09/2017  . Stable angina ([place]) 06/27/2014  . Stroke ([place]) 03/2016   hx of PAD; j"ust lots of headaches since" (04/17/2016)  . Vitamin B12 deficiency 06/27/2014  . Weight loss, unintentional 06/27/2014    Family History: Mother has history of diabetes.  Denies any family history of kidney disorders, cancers, or other medical  conditions.  Social History: Reports that he quit smoking about 3 to 4 years ago, denies any alcohol or drug use.  Reports that he lives alone and sister lives in [place].  States he has some support from his landlord that looks out for him.  Review of Systems: A complete ROS was negative except as per HPI.  Review of Systems  Constitutional: Negative for chills, fever and weight loss.  HENT: Negative.   Respiratory: Negative for cough, hemoptysis and shortness of breath.   Cardiovascular: Negative for chest pain, palpitations and orthopnea.  Gastrointestinal: Positive for constipation. Negative for abdominal pain, diarrhea, nausea and vomiting.  Genitourinary: Positive for frequency. Negative for dysuria, hematuria and urgency.  Musculoskeletal: Negative for myalgias and neck pain.  Skin: Negative.   Neurological: Positive for tremors (left upper and left lower resting tremor). Negative for tingling, focal weakness, weakness and headaches.  Psychiatric/Behavioral: Negative for hallucinations. The patient is not nervous/anxious.      Physical Exam: Blood pressure (!) 160/90, pulse 88, temperature 98.3 F (36.8 C), temperature source Oral, resp. rate (!) 30, SpO2 100 %. Physical Exam  Constitutional: No distress.  Well-developed, no acute distress, some movement of left upper and left lower extremity  HENT:  Head: Normocephalic and atraumatic.  Eyes: Pupils are equal, round, and reactive to light. EOM are normal.  Neck: Normal range of motion. Neck supple.  Cardiovascular: Normal rate, regular rhythm and normal heart sounds.  Pulmonary/Chest: Effort normal and breath sounds normal. No respiratory distress.  Abdominal: Soft. Bowel sounds are normal. He exhibits no distension. There is no tenderness.  Musculoskeletal: Normal range of motion.  Neurological: He is alert.  Cranial nerves II through XII grossly intact, constant movement of left upper and lower extremities  Skin: Skin  is warm and dry. He is not diaphoretic.  Psychiatric: Mood and affect normal.   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  Found to have a potassium of 6.2.  EKG did not show any peaked T waves or prolonged PR or QRS intervals.  Denies any symptoms from hyperkalemia.  Hyperkalemic emergency 2/2 hyporeninemic hypoaldosteronism: Potassium was 6.2 on admission.  Patient has history of hyporeninemic hypoaldosteronism, he has had similar episodes of hyperkalemia in the past.  He is currently on fludrocortisone 0.1 mg daily had recently been increased to 0.1 mg twice daily.  He reports that he gets his medications and packets from the pharmacy and is unsure of what he is taking, unclear if he started taking this medication  twice daily.  His any symptoms from hyperkalemia such as weakness, palpitations, tingling sensation, or nausea. EKG on admission showed no peaked T waves, no prolonged QRS or PR intervals.  Possible that this is secondary to the hyporeninemic hypoaldosteronism.  Other possibilities include rhabdomyolysis secondary to his chronic intermittent jerking movements.  Getting a CK to evaluate for this. -Calcium gluconate 1 g -Insulin 10 units and D50 -CBGs q1 hr x6 to monitor for hypoglycemia -Serial EKGs -CK -Telemetry -Repeat potassium q4 hours  CKD stage 3: Admission BUN 47 and creatinine 2.35.  Increased from his baseline creatinine of around 1.6-1.8.  He does have an increased BUN creatinine ratio.  Possibly prerenal nature due to decreased oral  intake.  He received a liter of normal saline the ED.  We will recheck BMP in AM.  -Repeat BMP in AM  Paroxsymal atrial fibrillation: Was switched from Plavix to Eliquis in the atrial fibrillation clinic today.  Was not in atrial fibrillation any changing or exam today.  Continue Eliquis for now.  Hypertension: Blood pressure up to 170/109 on admission.  He is on amlodipine at home. -Continue amlodipine 10 mg daily  Diabetes Mellitus: Last A1c was 5.7.  Patient was recently has to hold his Tradjenta due to risk of hypoglycemia.  He has a microalbumin creatinine ratio of greater than 1000.  He is not on anything right now. -Given insulin 10 units and D50.  It -CBGs q1hour for 6 occurances -Frequent CBGs  BPH: Is on tamsulosin and finasteride at home.  -Resume tamsulosin  FEN: No fluids, replete lytes prn, regular diet  VTE ppx: Eliquis Code Status: FULL    Dispo: Admit patient to Observation with expected length of stay less than 2 midnights.  Signed: Asencion Noble, MD 10/05/2018, 2:25 PM  Pager: [phone]

## 2018-10-05 NOTE — Patient Instructions (Signed)
Stop plavix  Start Eliquis 2.5mg  twice a day

## 2018-10-05 NOTE — ED Notes (Signed)
Ashok Cordia, MD notified Re: pts CBG, verbal to give D50 and attending paged

## 2018-10-05 NOTE — Progress Notes (Signed)
Internal Medicine Clinic Attending  I saw and evaluated the patient.  I personally confirmed the key portions of the history and exam documented by Dr. Eileen Stanford and I reviewed pertinent patient test results.  The assessment, diagnosis, and plan were formulated together and I agree with the documentation in the resident's note.  Lenice Pressman, M.D., Ph.D.

## 2018-10-05 NOTE — ED Triage Notes (Signed)
Pt was brought to the ED by internal medicine for abnormal lab. He had blood work drawn 4 days ago with potassium level 5.7, today it is 6.2. Pt noted to be tremoring on arrival.

## 2018-10-05 NOTE — Telephone Encounter (Signed)
Pt needs EKG, tx, and we do not have capability for cont tele. I called ER triage RN

## 2018-10-05 NOTE — Discharge Summary (Signed)
Name: Derrick Hendrix MRN: [id number] DOB: 1941/09/08 77 y.o. PCP: [name], MD  Date of Admission: 10/05/2018 11:52 AM Date of Discharge: 10/06/2018 Attending Physician: Aldine Contes  Discharge Diagnosis: 1. Hyperkalemia 2/2 medication and hyporeninemic hypoaldosteronism 2. CKD stage 3 3. Paroxysmal atrial fibrillation  Discharge Medications: Allergies as of 10/06/2018      Reactions   Aspirin Other (See Comments)   Nosebleeds       Medication List    STOP taking these medications   lisinopril 5 MG tablet Commonly known as:  PRINIVIL,ZESTRIL   TRADJENTA 5 MG Tabs tablet Generic drug:  linagliptin     TAKE these medications   acetaminophen 500 MG tablet Commonly known as:  TYLENOL Take 2 tablets (1,000 mg total) by mouth every 6 (six) hours as needed for mild pain (or Fever >/= 101).   amLODipine 10 MG tablet Commonly known as:  NORVASC TAKE 1 TABLET (10 MG TOTAL) BY MOUTH DAILY.   apixaban 2.5 MG Tabs tablet Commonly known as:  ELIQUIS Take 1 tablet (2.5 mg total) by mouth 2 (two) times daily.   atorvastatin 80 MG tablet Commonly known as:  LIPITOR TAKE 1 TABLET (80 MG TOTAL) BY MOUTH DAILY.   clonazePAM 0.25 MG disintegrating tablet Commonly known as:  KLONOPIN DISSOLVE ONE TABLET UNDER THE TONGUE THREE TIMES A DAY What changed:  See the new instructions.   docusate sodium 100 MG capsule Commonly known as:  COLACE Take 1 capsule (100 mg total) by mouth daily.   finasteride 5 MG tablet Commonly known as:  PROSCAR TAKE 1 TABLET (5 MG TOTAL) BY MOUTH DAILY.   fludrocortisone 0.1 MG tablet Commonly known as:  FLORINEF Take 1 tablet (0.1 mg total) by mouth 2 (two) times daily. What changed:  when to take this   furosemide 40 MG tablet Commonly known as:  LASIX Take 40 mg by mouth daily.   ipratropium 0.06 % nasal spray Commonly known as:  ATROVENT Place 2 sprays into the nose 3 (three) times daily. What changed:    when to take  this  reasons to take this   mupirocin ointment 2 % Commonly known as:  BACTROBAN Apply 1 application topically 2 (two) times daily.   nitroGLYCERIN 0.4 MG SL tablet Commonly known as:  NITROSTAT Place 1 tablet (0.4 mg total) under the tongue every 5 (five) minutes x 3 doses as needed for chest pain.   senna 8.6 MG Tabs tablet Commonly known as:  SENOKOT Take 1 tablet (8.6 mg total) by mouth 2 (two) times daily as needed for mild constipation.   tamsulosin 0.4 MG Caps capsule Commonly known as:  FLOMAX TAKE 1 CAPSULE (0.4 MG TOTAL) BY MOUTH DAILY AFTER SUPPER.       Disposition and follow-up:   Derrick Hendrix was discharged from [facility] in Good condition.  At the hospital follow up visit please address:  1.  Hyperkalemia: We stopped lisinopril and contacted pharmacy to not place in blister packs, please make sure that he does not have them in the packs that he has.  CKD: Admission BUN 47 and creatinine 2.35, did not improve with fluids, initially thought to be pre-renal in nature. He was given more fluids prior to discharge. Please recheck K.  2.  Labs / imaging needed at time of follow-up: BMP  3.  Pending labs/ test needing follow-up: U/A, Na and K urine, protein/cr ratio, urea nitrogen, urine osmolality  Follow-up Appointments: Follow-up Information  [name], MD Follow up.   Specialty:  Internal Medicine Why:  Friday October 25th at 1:15. Please bring all your medications in to go over with our pharmacist. Contact information: [address] [address] [place] [facility] Course by problem list: 1. Hyperkalemia 2/2 medication and hyporeninemic hypoaldosteronism: This is a 77 year old male with a history of hyporeninemic hypoaldosteronism(on Florinef), BPH, CKD3, PAD, cerebellar stroke with resultant hemiballisum, hypertension, diabetes mellitus,paroxsymal atrial fibrillation (switched from Plavix  to Eliquis today) presenting for an abnormal lab value.  Found to have a potassium of 6.2.  EKG did not show any peaked T waves or prolonged PR or QRS intervals. He was treated with calcium gluconate, insulin + glucose and had frequent CBGs and K checks. His K was 5.1 the next day and he continued to be asymptomatic. It was unclear what medications he was taking so the pharmacy was contacted.  Per pharmacy he taking Lasix 40 mg daily, amlodipine 10 mg daily, Florinef, and lisinopril 5 mg daily. His hyperkalemia was likely multifactorial, he is on lisinopril, has a history of hypoteninemic hypoaldosteronism, and presented with dehydration. We discontinued lisinopril and contacted pharmacy to make sure it does not go into the blister pack. Also worked up for worsening kidney function with urine studies.   2. CKD stage 3: Admission BUN 47 and creatinine 2.35. Received fluids but kidney function did not improve so renal u/s was done. Renal ultrasound showed no hydronephrosis and bilateral renal cyst. Given more fluids prior to discharge, will need repeat BMP on follow up. Checking urine protein/cr ration and FenA.  3. Paroxysmal atrial fibrillation: History of atrial fibrillation, was switched from Plavix to Eliquis in the atrial fibrillation clinic on admission. Was not in atrial fibrillation during his admission. Continued eliquis on discharge.   Discharge Vitals:   BP 140/87 (BP Location: Left Arm)   Pulse 89   Temp 98.1 F (36.7 C) (Oral)   Resp 19   SpO2 98%   Pertinent Labs, Studies, and Procedures:  BMP Latest Ref Rng & Units 10/06/2018 10/05/2018 10/05/2018  Glucose 70 - 99 mg/dL 103(H) 130(H) -  BUN 8 - 23 mg/dL 49(H) 47(H) -  Creatinine 0.61 - 1.24 mg/dL 2.56(H) 2.44(H) -  BUN/Creat Ratio 10 - 24 - - -  Sodium 135 - 145 mmol/L 142 140 -  Potassium 3.5 - 5.1 mmol/L 5.1 4.9 4.9  Chloride 98 - 111 mmol/L 114(H) 114(H) -  CO2 22 - 32 mmol/L 18(L) 20(L) -  Calcium 8.9 - 10.3 mg/dL 8.9 8.9 -    CBC Latest Ref Rng & Units 10/05/2018 09/01/2017 08/31/2017  WBC 4.0 - 10.5 K/uL 8.6 9.0 7.7  Hemoglobin 13.0 - 17.0 g/dL 11.6(L) 11.8(L) 12.1(L)  Hematocrit 39.0 - 52.0 % 39.0 36.7(L) 37.9(L)  Platelets 150 - 400 K/uL 318 308 308     Discharge Instructions: Discharge Instructions    Call MD for:  difficulty breathing, headache or visual disturbances   Complete by:  As directed    Call MD for:  extreme fatigue   Complete by:  As directed    Call MD for:  hives   Complete by:  As directed    Call MD for:  persistant dizziness or light-headedness   Complete by:  As directed    Call MD for:  persistant nausea and vomiting   Complete by:  As directed    Call MD  for:  redness, tenderness, or signs of infection (pain, swelling, redness, odor or green/yellow discharge around incision site)   Complete by:  As directed    Call MD for:  severe uncontrolled pain   Complete by:  As directed    Call MD for:  temperature >100.4   Complete by:  As directed    Diet - low sodium heart healthy   Complete by:  As directed    Discharge instructions   Complete by:  As directed    Algis Greenhouse,   It has been a pleasure working with you and we are glad you're feeling better. You were hospitalized for elevated potassium levels.   For your elevated potassium levels,  STOP taking lisinopril Increase your fludrocortisone to 0.1 mg twice a day, this was done in clinic Please follow up next week to repeat labs and see how you are doing   Follow up with your primary care provider in 1-2 weeks  If your symptoms worsen or you develop new symptoms, please seek medical help whether it is your primary care provider or emergency department.  If you have any questions about this hospitalization please call [phone].   Increase activity slowly   Complete by:  As directed       Signed: Asencion Noble, MD 10/06/2018, 3:06 PM   Pager: [phone]

## 2018-10-05 NOTE — ED Provider Notes (Signed)
[facility] EMERGENCY DEPARTMENT Provider Note   CSN: [id number] Arrival date & time: 10/05/18  1146     History   Chief Complaint Chief Complaint  Patient presents with  . Abnormal Lab    HPI Derrick Hendrix is a 77 y.o. male.  The history is provided by the patient. No language interpreter was used.   Derrick Hendrix is a 77 y.o. male who presents to the Emergency Department complaining of abnormal lab.  He had an outpatient lab drawn today and was notified to present to the ED for hyperkalemia. I is any recent illnesses or medication changes. He denies any current complaints. He does state that he urinates frequently, every 15 minutes or so. No dysuria or difficulty in emptying his bladder. Past Medical History:  Diagnosis Date  . Chronic kidney disease (CKD), stage III (moderate) (HCC)   . Constipation 11/07/2014  . Daily headache    "lately" (04/17/2016)  . Depression   . Dizziness 03/07/2016  . Headache 06/26/2014  . Heart murmur   . High cholesterol   . Hyperkalemia 08/2014  . Hyperkalemia 10/20/2014  . Hyperlipemia 11/11/2016  . Hypertension   . Neuropathic pain 02/06/2015  . PAD (peripheral artery disease) ([place])   . Paronychia of great toe, right   . Preop cardiovascular exam   . Right foot pain 02/09/2017  . Stable angina ([place]) 06/27/2014  . Stroke ([place]) 03/2016   hx of PAD; j"ust lots of headaches since" (04/17/2016)  . Vitamin B12 deficiency 06/27/2014  . Weight loss, unintentional 06/27/2014    Patient Active Problem List   Diagnosis Date Noted  . Type 2 diabetes mellitus ([place]) 09/04/2017  . Hemiballism 08/31/2017  . Abscess of foot without toes, left 08/28/2017  . Osteomyelitis ([place])   . Ischemic pain of foot, right   . Severe peripheral arterial disease ([place]) 02/27/2017  . Cerebellar infarct ([place]) 05/06/2016  . Benign prostatic hyperplasia with urinary obstruction   . Orthostatic hypotension 04/04/2016  . Urinary retention 03/20/2016  .  Cerebrovascular accident (CVA) due to occlusion of left posterior cerebral artery ([place])   . Essential hypertension   . Normocytic anemia 10/20/2014  . Hyporeninemic hypoaldosteronism ([place]) 08/24/2014  . Healthcare maintenance 07/13/2014  . Atherosclerosis of leg with intermittent claudication, R 06/29/2014  . Current smoker 06/27/2014  . Alcohol use ([place]) 06/27/2014  . CKD (chronic kidney disease), stage III ([place]) 06/26/2014    Past Surgical History:  Procedure Laterality Date  . ABDOMINAL AORTOGRAM W/LOWER EXTREMITY N/A 03/02/2017   Procedure: Abdominal Aortogram w/Lower Extremity;  Surgeon: Conrad Ione, MD;  Location: [facility];  Service: Cardiovascular;  Laterality: N/A;  . EP IMPLANTABLE DEVICE N/A 03/04/2016   Procedure: Loop Recorder Insertion;  Surgeon: Thompson Grayer, MD;  Location: [facility];  Service: Cardiovascular;  Laterality: N/A;  . PERIPHERAL VASCULAR INTERVENTION Right 03/06/2017   Procedure: Peripheral Vascular Intervention;  Surgeon: Elam Dutch, MD;  Location: [facility];  Service: Cardiovascular;  Laterality: Right;  SFA        Home Medications    Prior to Admission medications   Medication Sig Start Date End Date Taking? Authorizing Provider  acetaminophen (TYLENOL) 500 MG tablet Take 2 tablets (1,000 mg total) by mouth every 6 (six) hours as needed for mild pain (or Fever >/= 101). 04/05/16   Loleta Chance, MD  amLODipine (NORVASC) 10 MG tablet TAKE 1 TABLET (10 MG TOTAL) BY MOUTH DAILY. 07/21/18 07/21/19  [name], Obed  K, MD  apixaban (ELIQUIS) 2.5 MG TABS tablet Take 1 tablet (2.5 mg total) by mouth 2 (two) times daily. 10/05/18   Sherran Needs, NP  atorvastatin (LIPITOR) 80 MG tablet TAKE 1 TABLET (80 MG TOTAL) BY MOUTH DAILY. 10/01/18   Oda Kilts, MD  clonazePAM (KLONOPIN) 0.25 MG disintegrating tablet DISSOLVE ONE TABLET UNDER THE TONGUE THREE TIMES A DAY 09/15/18   Axel Filler, MD  docusate sodium (COLACE) 100 MG  capsule Take 1 capsule (100 mg total) by mouth daily. 06/02/18   Allred, Jeneen Rinks, MD  finasteride (PROSCAR) 5 MG tablet TAKE 1 TABLET (5 MG TOTAL) BY MOUTH DAILY. 10/01/18   Oda Kilts, MD  fludrocortisone (FLORINEF) 0.1 MG tablet Take 1 tablet (0.1 mg total) by mouth daily. 06/09/18   Sid Falcon, MD  HYDROcodone-acetaminophen (NORCO/VICODIN) 5-325 MG tablet TAKE 1 TABLET BY MOUTH EVERY 6 HOURS AS NEEDED FOR MODERATE PAIN 08/31/17   [provider]  ipratropium (ATROVENT) 0.06 % nasal spray Place 2 sprays into the nose 3 (three) times daily. Patient taking differently: Place 2 sprays into the nose daily as needed for rhinitis.  02/06/15   Luan Moore, MD  mupirocin ointment (BACTROBAN) 2 % Apply 1 application topically 2 (two) times daily. 05/25/17   Edrick Kins, DPM  nitroGLYCERIN (NITROSTAT) 0.4 MG SL tablet Place 1 tablet (0.4 mg total) under the tongue every 5 (five) minutes x 3 doses as needed for chest pain. Patient not taking: Reported on 10/05/2018 11/29/14   Luan Moore, MD  senna (SENOKOT) 8.6 MG TABS tablet Take 1 tablet (8.6 mg total) by mouth 2 (two) times daily as needed for mild constipation. 06/02/18   Allred, Jeneen Rinks, MD  tamsulosin (FLOMAX) 0.4 MG CAPS capsule TAKE 1 CAPSULE (0.4 MG TOTAL) BY MOUTH DAILY AFTER SUPPER. 06/09/18   Sid Falcon, MD  TRADJENTA 5 MG TABS tablet TAKE 1 TABLET (5 MG TOTAL) BY MOUTH DAILY. 07/21/18   [name], MD    Family History Family History  Problem Relation Age of Onset  . Hypertension Mother        died age 23  . Diabetes Mother     Social History Social History   Tobacco Use  . Smoking status: Former Smoker    Years: 66.00    Types: Cigarettes    Start date: 06/23/2017  . Smokeless tobacco: Never Used  Substance Use Topics  . Alcohol use: No    Alcohol/week: 0.0 standard drinks    Comment: 04/17/2016 "nothing in the last 3-4 years"  . Drug use: No     Allergies   Aspirin   Review of Systems Review of  Systems  All other systems reviewed and are negative.    Physical Exam Updated Vital Signs BP 126/73   Pulse 84   Temp 98.3 F (36.8 C) (Oral)   Resp 17   SpO2 96%   Physical Exam  Constitutional: He is oriented to person, place, and time. He appears well-developed and well-nourished.  Frequent jerking movements of the left upper extremity.  HENT:  Head: Normocephalic and atraumatic.  Cardiovascular: Normal rate and regular rhythm.  No murmur heard. Pulmonary/Chest: Effort normal and breath sounds normal. No respiratory distress.  Abdominal: Soft. There is no tenderness. There is no rebound and no guarding.  Musculoskeletal: He exhibits no edema or tenderness.  Neurological: He is alert and oriented to person, place, and time.  Skin: Skin is warm and dry.  Psychiatric: He  has a normal mood and affect. His behavior is normal.  Nursing note and vitals reviewed.    ED Treatments / Results  Labs (all labs ordered are listed, but only abnormal results are displayed) Labs Reviewed  URINALYSIS, ROUTINE W REFLEX MICROSCOPIC  CK    EKG EKG Interpretation  Date/Time:  Tuesday October 05 2018 11:53:36 EDT Ventricular Rate:  90 PR Interval:  164 QRS Duration: 100 QT Interval:  340 QTC Calculation: 415 R Axis:   59 Text Interpretation:  Sinus rhythm with Premature atrial complexes Moderate voltage criteria for LVH, may be normal variant Nonspecific T wave abnormality Abnormal ECG Confirmed by Quintella Reichert [phone]) on 10/05/2018 12:28:43 PM   Radiology No results found.  Procedures Procedures (including critical care time)  Medications Ordered in ED Medications  0.9 %  sodium chloride infusion (has no administration in time range)     Initial Impression / Assessment and Plan / ED Course  I have reviewed the triage vital signs and the nursing notes.  Pertinent labs & imaging results that were available during my care of the patient were reviewed by me and  considered in my medical decision making (see chart for details).     Patient referred to the emergency department for evaluation of hyperkalemia on outpatient labs. EKG is similar when compared to priors with LVH pattern. He was treated with IV fluid hydration. Internal medicine consulted for observation admission for worsening renal function and hyperkalemia.  Final Clinical Impressions(s) / ED Diagnoses   Final diagnoses:  None    ED Discharge Orders    None       Quintella Reichert, MD 10/05/18 1553

## 2018-10-05 NOTE — Telephone Encounter (Signed)
Pt went to afib clinic this am, checked K+, 6.2, imc clinic has tried to make contact w/ pt recently and couldn't, afib clinic is sending pt to [facility], appt was scheduled by front office for 1515 today. Triage is told that afib clinic wants to know what imc will do with the pt and problem, please advise

## 2018-10-05 NOTE — Progress Notes (Signed)
Primary Care Physician: [name], MD Referring Physician: Dr. Elta Guadeloupe Teel is a 77 y.o. male with a h/o CKD, stage III,hyperkalemia, HTN, prior CVA, 02/2016, PAD, that is in the afib clinic to start anticoagulation for paroxysmal afib that was seen on LINQ, implanted at time of cryptogenic stroke.It took many attempts to get pt to the office today. He is a very poor historian and adds little to the visit today.   Today, he denies symptoms of palpitations, chest pain, shortness of breath, orthopnea, PND, lower extremity edema, dizziness, presyncope, syncope, or neurologic sequela. The patient is tolerating medications without difficulties and is otherwise without complaint today.   Past Medical History:  Diagnosis Date  . Chronic kidney disease (CKD), stage III (moderate) (HCC)   . Constipation 11/07/2014  . Daily headache    "lately" (04/17/2016)  . Depression   . Dizziness 03/07/2016  . Headache 06/26/2014  . Heart murmur   . High cholesterol   . Hyperkalemia 08/2014  . Hyperkalemia 10/20/2014  . Hyperlipemia 11/11/2016  . Hypertension   . Neuropathic pain 02/06/2015  . PAD (peripheral artery disease) ([place])   . Paronychia of great toe, right   . Preop cardiovascular exam   . Right foot pain 02/09/2017  . Stable angina ([place]) 06/27/2014  . Stroke ([place]) 03/2016   hx of PAD; j"ust lots of headaches since" (04/17/2016)  . Vitamin B12 deficiency 06/27/2014  . Weight loss, unintentional 06/27/2014   Past Surgical History:  Procedure Laterality Date  . ABDOMINAL AORTOGRAM W/LOWER EXTREMITY N/A 03/02/2017   Procedure: Abdominal Aortogram w/Lower Extremity;  Surgeon: Conrad Bridger, MD;  Location: [facility];  Service: Cardiovascular;  Laterality: N/A;  . EP IMPLANTABLE DEVICE N/A 03/04/2016   Procedure: Loop Recorder Insertion;  Surgeon: Thompson Grayer, MD;  Location: [facility];  Service: Cardiovascular;  Laterality: N/A;  . PERIPHERAL VASCULAR INTERVENTION Right  03/06/2017   Procedure: Peripheral Vascular Intervention;  Surgeon: Elam Dutch, MD;  Location: [facility];  Service: Cardiovascular;  Laterality: Right;  SFA    Current Outpatient Medications  Medication Sig Dispense Refill  . acetaminophen (TYLENOL) 500 MG tablet Take 2 tablets (1,000 mg total) by mouth every 6 (six) hours as needed for mild pain (or Fever >/= 101). 30 tablet 0  . amLODipine (NORVASC) 10 MG tablet TAKE 1 TABLET (10 MG TOTAL) BY MOUTH DAILY. 90 tablet 2  . atorvastatin (LIPITOR) 80 MG tablet TAKE 1 TABLET (80 MG TOTAL) BY MOUTH DAILY. 90 tablet 10  . clonazePAM (KLONOPIN) 0.25 MG disintegrating tablet DISSOLVE ONE TABLET UNDER THE TONGUE THREE TIMES A DAY 90 tablet 0  . docusate sodium (COLACE) 100 MG capsule Take 1 capsule (100 mg total) by mouth daily. 90 capsule 3  . finasteride (PROSCAR) 5 MG tablet TAKE 1 TABLET (5 MG TOTAL) BY MOUTH DAILY. 90 tablet 10  . fludrocortisone (FLORINEF) 0.1 MG tablet Take 1 tablet (0.1 mg total) by mouth daily. 90 tablet 3  . HYDROcodone-acetaminophen (NORCO/VICODIN) 5-325 MG tablet TAKE 1 TABLET BY MOUTH EVERY 6 HOURS AS NEEDED FOR MODERATE PAIN  0  . ipratropium (ATROVENT) 0.06 % nasal spray Place 2 sprays into the nose 3 (three) times daily. (Patient taking differently: Place 2 sprays into the nose daily as needed for rhinitis. ) 15 mL 2  . mupirocin ointment (BACTROBAN) 2 % Apply 1 application topically 2 (two) times daily. 22 g 0  . senna (SENOKOT) 8.6 MG TABS  tablet Take 1 tablet (8.6 mg total) by mouth 2 (two) times daily as needed for mild constipation. 180 each 3  . tamsulosin (FLOMAX) 0.4 MG CAPS capsule TAKE 1 CAPSULE (0.4 MG TOTAL) BY MOUTH DAILY AFTER SUPPER. 90 capsule 3  . TRADJENTA 5 MG TABS tablet TAKE 1 TABLET (5 MG TOTAL) BY MOUTH DAILY. 90 tablet 2  . apixaban (ELIQUIS) 2.5 MG TABS tablet Take 1 tablet (2.5 mg total) by mouth 2 (two) times daily. 60 tablet 6  . nitroGLYCERIN (NITROSTAT) 0.4 MG SL tablet Place 1  tablet (0.4 mg total) under the tongue every 5 (five) minutes x 3 doses as needed for chest pain. (Patient not taking: Reported on 10/05/2018) 30 tablet 3   No current facility-administered medications for this encounter.     Allergies  Allergen Reactions  . Aspirin Other (See Comments)    Nosebleeds     Social History   Socioeconomic History  . Marital status: Single    Spouse name: Not on file  . Number of children: Not on file  . Years of education: Not on file  . Highest education level: Not on file  Occupational History  . Not on file  Social Needs  . Financial resource strain: Not on file  . Food insecurity:    Worry: Not on file    Inability: Not on file  . Transportation needs:    Medical: Not on file    Non-medical: Not on file  Tobacco Use  . Smoking status: Former Smoker    Years: 66.00    Types: Cigarettes    Start date: 06/23/2017  . Smokeless tobacco: Never Used  Substance and Sexual Activity  . Alcohol use: No    Alcohol/week: 0.0 standard drinks    Comment: 04/17/2016 "nothing in the last 3-4 years"  . Drug use: No  . Sexual activity: Never  Lifestyle  . Physical activity:    Days per week: Not on file    Minutes per session: Not on file  . Stress: Not on file  Relationships  . Social connections:    Talks on phone: Not on file    Gets together: Not on file    Attends religious service: Not on file    Active member of club or organization: Not on file    Attends meetings of clubs or organizations: Not on file    Relationship status: Not on file  . Intimate partner violence:    Fear of current or ex partner: Not on file    Emotionally abused: Not on file    Physically abused: Not on file    Forced sexual activity: Not on file  Other Topics Concern  . Not on file  Social History Narrative   Works in the yard       Family History  Problem Relation Age of Onset  . Hypertension Mother        died age 34  . Diabetes Mother     ROS- All  systems are reviewed and negative except as per the HPI above  Physical Exam: Vitals:   10/05/18 0943  BP: 118/72  Pulse: 98  Weight: 96.6 kg  Height: 6\' 2"  (1.88 m)   Wt Readings from Last 3 Encounters:  10/05/18 96.6 kg  10/01/18 97.7 kg  06/02/18 99.3 kg    Labs: Lab Results  Component Value Date   NA 140 10/05/2018   K 6.2 (H) 10/05/2018   CL 115 (H) 10/05/2018  CO2 19 (L) 10/05/2018   GLUCOSE 100 (H) 10/05/2018   BUN 47 (H) 10/05/2018   CREATININE 2.35 (H) 10/05/2018   CALCIUM 9.2 10/05/2018   PHOS 4.1 10/20/2014   MG 2.0 10/20/2014   Lab Results  Component Value Date   INR 1.01 03/06/2017   Lab Results  Component Value Date   CHOL 149 03/03/2016   HDL 41 03/03/2016   LDLCALC 95 03/03/2016   TRIG 65 03/03/2016     GEN- The patient is chronically ill appearing, alert and oriented x 3, with  poor hygiene  Head- normocephalic, atraumatic Eyes-  Sclera clear, conjunctiva pink Ears- hearing intact Oropharynx- clear Neck- supple, no JVP Lymph- no cervical lymphadenopathy Lungs- Clear to ausculation bilaterally, normal work of breathing Heart- Regular rate and rhythm, no murmurs, rubs or gallops, PMI not laterally displaced GI- soft, NT, ND, + BS Extremities- no clubbing, cyanosis, or edema, involuntary shaking of left extremities  MS- no significant deformity or atrophy Skin- no rash or lesion Psych- euthymic mood, full affect Neuro- strength and sensation are intact  EKG-NSR at 98 bpm, PR int 128 ms, qrs int 96 ms, qtc 436 ms Epic records reviewed    Assessment and Plan: 1. New onset asymptomatic afib per LInq monitor Poor historian It took many phone calls to get pt to come into office WIth h/o prior stroke and now afib, plavix will be stopped and eliquis 2.5 mg bid started( 77 yo in January with Creatinine over 1.5 ) Discussed with his outpt pharmacy who blister packs his meds, as he lives alone This change in meds will be delivered tomorrow    2. CKD with hyperkalemia Can see where pt had his labs drawn in internal medicine with Creatinine elevated at 2.36 and K+ at 5.7, they tried many times for the pt to call to f/u on this this and he would not return their calls I repeated bmet today and asked pt to stay until results seen. His creatinine is 2.35 and K+ worse at 6.2. I called to Internal med for f/u and they advised to bring down to their clinic.  I will see back in 2-3 weeks  Butch Penny C. Jaine Estabrooks, [facility] [address] [place], [place] [address] [phone]

## 2018-10-06 DIAGNOSIS — I69398 Other sequelae of cerebral infarction: Secondary | ICD-10-CM | POA: Diagnosis not present

## 2018-10-06 DIAGNOSIS — E875 Hyperkalemia: Secondary | ICD-10-CM | POA: Diagnosis not present

## 2018-10-06 DIAGNOSIS — E269 Hyperaldosteronism, unspecified: Secondary | ICD-10-CM | POA: Diagnosis not present

## 2018-10-06 DIAGNOSIS — E1151 Type 2 diabetes mellitus with diabetic peripheral angiopathy without gangrene: Secondary | ICD-10-CM | POA: Diagnosis not present

## 2018-10-06 DIAGNOSIS — Z886 Allergy status to analgesic agent status: Secondary | ICD-10-CM | POA: Diagnosis not present

## 2018-10-06 DIAGNOSIS — Z79899 Other long term (current) drug therapy: Secondary | ICD-10-CM

## 2018-10-06 DIAGNOSIS — N400 Enlarged prostate without lower urinary tract symptoms: Secondary | ICD-10-CM | POA: Diagnosis not present

## 2018-10-06 DIAGNOSIS — E1122 Type 2 diabetes mellitus with diabetic chronic kidney disease: Secondary | ICD-10-CM

## 2018-10-06 DIAGNOSIS — I129 Hypertensive chronic kidney disease with stage 1 through stage 4 chronic kidney disease, or unspecified chronic kidney disease: Secondary | ICD-10-CM | POA: Diagnosis not present

## 2018-10-06 DIAGNOSIS — I480 Paroxysmal atrial fibrillation: Secondary | ICD-10-CM | POA: Diagnosis not present

## 2018-10-06 DIAGNOSIS — Z7901 Long term (current) use of anticoagulants: Secondary | ICD-10-CM | POA: Diagnosis not present

## 2018-10-06 DIAGNOSIS — N183 Chronic kidney disease, stage 3 (moderate): Secondary | ICD-10-CM | POA: Diagnosis not present

## 2018-10-06 DIAGNOSIS — G255 Other chorea: Secondary | ICD-10-CM | POA: Diagnosis not present

## 2018-10-06 LAB — URINALYSIS, ROUTINE W REFLEX MICROSCOPIC
Bacteria, UA: NONE SEEN
Bilirubin Urine: NEGATIVE
Glucose, UA: NEGATIVE mg/dL
Ketones, ur: NEGATIVE mg/dL
Leukocytes, UA: NEGATIVE
Nitrite: NEGATIVE
Protein, ur: 100 mg/dL — AB
SPECIFIC GRAVITY, URINE: 1.014 (ref 1.005–1.030)
pH: 5 (ref 5.0–8.0)

## 2018-10-06 LAB — RENAL FUNCTION PANEL
ALBUMIN: 3.6 g/dL (ref 3.5–5.0)
Anion gap: 10 (ref 5–15)
BUN: 49 mg/dL — AB (ref 8–23)
CALCIUM: 8.9 mg/dL (ref 8.9–10.3)
CO2: 18 mmol/L — ABNORMAL LOW (ref 22–32)
Chloride: 114 mmol/L — ABNORMAL HIGH (ref 98–111)
Creatinine, Ser: 2.56 mg/dL — ABNORMAL HIGH (ref 0.61–1.24)
GFR calc Af Amer: 26 mL/min — ABNORMAL LOW (ref >60)
GFR, EST NON AFRICAN AMERICAN: 22 mL/min — AB (ref >60)
Glucose, Bld: 103 mg/dL — ABNORMAL HIGH (ref 70–99)
PHOSPHORUS: 4.3 mg/dL (ref 2.5–4.6)
POTASSIUM: 5.1 mmol/L (ref 3.5–5.1)
SODIUM: 142 mmol/L (ref 135–145)

## 2018-10-06 LAB — PROTEIN / CREATININE RATIO, URINE
Creatinine, Urine: 157.36 mg/dL
Protein Creatinine Ratio: 0.88 mg/mg{Cre} — ABNORMAL HIGH (ref 0.00–0.15)
TOTAL PROTEIN, URINE: 138 mg/dL

## 2018-10-06 LAB — NA AND K (SODIUM & POTASSIUM), RAND UR
Potassium Urine: 44 mmol/L
Sodium, Ur: 60 mmol/L

## 2018-10-06 LAB — OSMOLALITY, URINE: OSMOLALITY UR: 477 mosm/kg (ref 300–900)

## 2018-10-06 LAB — GLUCOSE, CAPILLARY
GLUCOSE-CAPILLARY: 157 mg/dL — AB (ref 70–99)
GLUCOSE-CAPILLARY: 74 mg/dL (ref 70–99)

## 2018-10-06 MED ORDER — FLUDROCORTISONE ACETATE 0.1 MG PO TABS: 0.1000 mg | ORAL_TABLET | Freq: Two times a day (BID) | ORAL | 3 refills | Status: DC

## 2018-10-06 MED ORDER — SODIUM CHLORIDE 0.9 % IV BOLUS
500.0000 mL | Freq: Once | INTRAVENOUS | Status: AC
  Administered 2018-10-06: 500 mL via INTRAVENOUS

## 2018-10-06 NOTE — Research (Unsigned)
STROKE~AF Research study: Patient in AFib clinic today for treatment of recent diagnosis of AF. New update version (IRB dated 02/23/2018) ICF signed and copy given to patient for his records.

## 2018-10-06 NOTE — Progress Notes (Addendum)
Subjective: Derrick Hendrix reports that he is feeling good today, no acute events overnight. He denies any muscles pains or palpitations. He reports that he is feeling good and is ready to go home. We discussed that his kidney function tests are worsening and we discussed that there are multiple things that can contribute. We discussed that we will run some urine studies and change what medications he is taking at home. We discussed that he should have close follow up with his PCP. He is in agreement with the plan.   Objective:  Vital signs in last 24 hours: Vitals:   10/05/18 1615 10/05/18 1651 10/06/18 0021 10/06/18 0600  BP: (!) 148/90 (!) 162/83 127/67 131/78  Pulse: 81 90 84 84  Resp: 19 18 18 16   Temp:  98.4 F (36.9 C) 99.1 F (37.3 C) 98.7 F (37.1 C)  TempSrc:  Oral Oral Oral  SpO2: 95% 94% 93% 97%    General: Well nourished, NAD  Cardiac: RRR, normal S1, S2, no murmurs, rubs or gallops  Pulmonary: Lungs CTA bilaterally, no wheezing, rhonchi or rales  Abdomen: Soft, non-tender, +bowel sounds, no guarding or masses noted  Extremity: Constant jerky motion of the left upper and left lower leg Psychiatry: Normal mood and affect     Assessment/Plan:  Active Problems:   Hyperkalemia    This is a 77 year old male with a history of hyporeninemic hypoaldosteronism(on Florinef), BPH, CKD3, PAD, cerebellar stroke with resultant hemiballisum, hypertension, diabetes mellitus,paroxsymal atrial fibrillation (switched from Plavix to Eliquis today) presenting for an abnormal lab value.  Found to have a potassium of 6.2.  EKG did not show any peaked T waves or prolonged PR or QRS intervals.  Denies any symptoms from hyperkalemia.  Hyperkalemic emergency 2/2 hyporeninemic hypoaldosteronism: K today was 5.1, down from 6.2 on admission.  He had been given calcium gluconate, insulin and D50.  CBGs have remained normal.  He was also restarted on his fludrocortisone 0.1 mg daily.  CK was found to  be elevated to 566, not elevated enough to be rhabdo but may have been caused by his hemiballismus.  Is also some confusion as to what medications he is taking, the pharmacy was contacted and they reported that he is taking Lasix 40 mg daily, amlodipine 10 mg daily, Florinef, and lisinopril 5 mg daily.  His hyperkalemia may be multifactorial, he is on lisinopril, has a history of hypoteninemic hypoaldosteronism, and some dehydration. K has returned to normal. We will assess kidney function with urine studies and have close follow up outpatient. -U/A, urine Na and K, follow up outpatient -Discontinue Lisinopril on discharge, will contact pharmacy to ensure it does not go into the blister packs -Increase hydrocortisone to 0.1 mg twice daily, this was done in clinic but will make sure that prescription is updated -Follow-up with clinic pharmacist to go over medications  CKD stage 3: Admission BUN 47 and creatinine 2.35.  Increased from his baseline creatinine of around 1.6-1.8.  He does have an increased BUN creatinine ratio.  Possibly prerenal nature due to decreased oral intake.  He received a liter of normal saline the ED.   BUN 49, creatinine 2.56.  Renal ultrasound showed no hydronephrosis bilateral renal cyst. -Give 500 cc NS -Repeat BMP on follow up  Paroxsymal atrial fibrillation: Was switched from Plavix to Eliquis in the atrial fibrillation clinic today.  Was not in atrial fibrillation any changing or exam today.  Continue Eliquis for now.  Hypertension: Blood pressure up  to 170/109 on admission.  He is on amlodipine at home. -Continue amlodipine 10 mg daily  Diabetes Mellitus: Last A1c was 5.7.  Patient was recently has to hold his Tradjenta due to risk of hypoglycemia.  He has a microalbumin creatinine ratio of greater than 1000.  He is not on anything right now. -Given insulin 10 units and D50.  -Frequent CBGs  BPH: Is on tamsulosin and finasteride at home.  -Resume  tamsulosin  FEN: No fluids, replete lytes prn, regular diet  VTE ppx: Eliquis Code Status: FULL    Dispo: Anticipated discharge in approximately today.   Derrick Noble, MD 10/06/2018, 6:36 AM Pager: [phone]

## 2018-10-06 NOTE — Progress Notes (Signed)
  Date: 10/06/2018  Patient name: Derrick Hendrix  Medical record number: [id number]  Date of birth: December 30, 1940   I have seen and evaluated Derrick Hendrix and discussed their care with the Residency Team.  In brief, patient is a 77 year old male with past medical history of hyporeninemic hypoaldosteronism, BPH, CKD stage III, PAD, cerebellar stroke with resultant hemiballismus, hypertension, diabetes type 2, paroxysmal A. fib who presented with hyperkalemia on outpatient blood work.  Patient was seen in [facility] on October 01, 2018 and had blood work done which showed a potassium of 5.7.  The clinic made multiple attempts to reach out to the patient but were unsuccessful in arranging a follow-up for him.  Patient was seen in the A. fib clinic yesterday and had repeat blood work drawn which showed a potassium of 6.2 and he was referred back to [facility] for follow-up.  Patient was admitted to the hospital from [facility] given his elevated potassium levels.  EKG showed no acute changes secondary to hyperkalemia.  Patient was admitted for observation.  No nausea or vomiting, no palpitations, no lightheadedness, no syncope, no abdominal pain, no muscle weakness, no fevers chills, no headache, no blurry vision.  Patient did complain of some constipation.  Today patient feels well and denies any acute complaints.  He wants to go home today.  PMHx, Fam Hx, and/or Soc Hx : As per resident admit note  Vitals:   10/06/18 0600 10/06/18 1050  BP: 131/78 133/80  Pulse: 84 88  Resp: 16 18  Temp: 98.7 F (37.1 C)   SpO2: 97% 96%   General: Awake, alert, oriented x3, NAD CVS: Regular rate and rhythm, normal heart sounds Lungs: CTA bilaterally Abdomen: Soft, nontender, nondistended, normoactive bowel sounds Extremities: No edema noted, constant jerking motion of his left upper and lower extremity  Assessment and Plan: I have seen and evaluated the patient as outlined above. I agree with the formulated Assessment and Plan as  detailed in the residents' note, with the following changes:   1.  Hyperkalemia: -Patient was admitted to the hospital with potassium of 6.2 in the setting of a history of hyporeninemic hypoaldosteronism and recent reduction in his fludrocortisone dose to 0.1 mg daily from 0.1 mg twice daily. -EKG with no acute changes of hyperkalemia -Patient was initially given calcium gluconate and insulin and D50 -Potassium level is now normalized.  Will DC Kayexalate for now -Patient recently had his fludrocortisone dose decreased to 0.1 mg daily from 0.1 mg twice daily.  He currently is on fluid cortisone 0.1 mg daily in the hospital.  Will consider increasing it to 0.1 mg twice daily per last PCP note -Per home med list of patient he is also on lisinopril.  Will DC this for now. -Patient was also noted to have AKI on CKD.  His baseline creatinine is approximately 1.6-1.8.  Renal ultrasound showed no hydronephrosis.  It is possible that there is a prerenal component to his AKI.  Will give an additional 500 cc normal saline.  Patient will need repeat BMP on follow-up as an outpatient. -We will check urine protein to creatinine ratio as well as a fractional excretion of urea as patient is on Lasix at home and fractional excretion of sodium may not be accurate -No further work-up at this time -Patient stable for discharge home today  Aldine Contes, MD 10/23/201912:54 PM

## 2018-10-06 NOTE — Progress Notes (Signed)
Pt alert and oriented x4 ; ambulating with steady gait; pt states he has his house keys and will be going home via taxi ; taxi called at this time and will pick him up at main hospital entrance

## 2018-10-06 NOTE — Care Management Obs Status (Signed)
[facility] NOTIFICATION   Patient Details  Name: Derrick Hendrix MRN: [id number] Date of Birth: [birth year]   Medicare Observation Status Notification Given:  Yes    Bethena Roys, RN 10/06/2018, 2:04 PM

## 2018-10-06 NOTE — Discharge Instructions (Addendum)
Derrick Hendrix,   It has been a pleasure working with you and we are glad you're feeling better. You were hospitalized for elevated potassium levels.   For your elevated potassium levels,  STOP taking lisinopril Increase your fludrocortisone to 0.1 mg twice a day, this was done in clinic Please follow up next week to repeat labs and see how you are doing   Follow up with your primary care provider in 1-2 weeks  If your symptoms worsen or you develop new symptoms, please seek medical help whether it is your primary care provider or emergency department.  If you have any questions about this hospitalization please call [phone].    Information on my medicine - ELIQUIS (apixaban)  Why was Eliquis prescribed for you? Eliquis was prescribed for you to reduce the risk of a blood clot forming that can cause a stroke if you have a medical condition called atrial fibrillation (a type of irregular heartbeat).  What do You need to know about Eliquis ? Take your Eliquis TWICE DAILY - one tablet in the morning and one tablet in the evening with or without food. If you have difficulty swallowing the tablet whole please discuss with your pharmacist how to take the medication safely.  Take Eliquis exactly as prescribed by your doctor and DO NOT stop taking Eliquis without talking to the doctor who prescribed the medication.  Stopping may increase your risk of developing a stroke.  Refill your prescription before you run out.  After discharge, you should have regular check-up appointments with your healthcare provider that is prescribing your Eliquis.  In the future your dose may need to be changed if your kidney function or weight changes by a significant amount or as you get older.  What do you do if you miss a dose? If you miss a dose, take it as soon as you remember on the same day and resume taking twice daily.  Do not take more than one dose of ELIQUIS at the same time to make up a  missed dose.  Important Safety Information A possible side effect of Eliquis is bleeding. You should call your healthcare provider right away if you experience any of the following: ? Bleeding from an injury or your nose that does not stop. ? Unusual colored urine (red or dark brown) or unusual colored stools (red or black). ? Unusual bruising for unknown reasons. ? A serious fall or if you hit your head (even if there is no bleeding).  Some medicines may interact with Eliquis and might increase your risk of bleeding or clotting while on Eliquis. To help avoid this, consult your healthcare provider or pharmacist prior to using any new prescription or non-prescription medications, including herbals, vitamins, non-steroidal anti-inflammatory drugs (NSAIDs) and supplements.  This website has more information on Eliquis (apixaban): http://www.eliquis.com/eliquis/home

## 2018-10-06 NOTE — Clinical Social Work Note (Signed)
Patient has no one that can pick him up but has a credit card to pay for his own cab. RN aware.  CSW signing off.  Derrick Hendrix, [place]

## 2018-10-06 NOTE — Telephone Encounter (Signed)
Thank you! I'm glad to hear he presented to clinic.

## 2018-10-07 ENCOUNTER — Telehealth: Payer: Self-pay | Admitting: Internal Medicine

## 2018-10-07 LAB — UREA NITROGEN, URINE: Urea Nitrogen, Ur: 666 mg/dL

## 2018-10-07 NOTE — Telephone Encounter (Signed)
TOC-HFU APPT 10/08/2018 PER DR Sherry Ruffing

## 2018-10-08 ENCOUNTER — Ambulatory Visit: Payer: Medicare Other | Admitting: Pharmacist

## 2018-10-08 ENCOUNTER — Ambulatory Visit (INDEPENDENT_AMBULATORY_CARE_PROVIDER_SITE_OTHER): Payer: Medicare Other | Admitting: Internal Medicine

## 2018-10-08 ENCOUNTER — Other Ambulatory Visit: Payer: Self-pay

## 2018-10-08 ENCOUNTER — Encounter: Payer: Self-pay | Admitting: Internal Medicine

## 2018-10-08 VITALS — BP 138/73 | HR 99 | Temp 97.9°F | Ht 74.0 in | Wt 213.2 lb

## 2018-10-08 DIAGNOSIS — E2749 Other adrenocortical insufficiency: Secondary | ICD-10-CM

## 2018-10-08 DIAGNOSIS — G255 Other chorea: Secondary | ICD-10-CM | POA: Diagnosis not present

## 2018-10-08 DIAGNOSIS — N183 Chronic kidney disease, stage 3 unspecified: Secondary | ICD-10-CM

## 2018-10-08 DIAGNOSIS — E1122 Type 2 diabetes mellitus with diabetic chronic kidney disease: Secondary | ICD-10-CM

## 2018-10-08 DIAGNOSIS — Z87891 Personal history of nicotine dependence: Secondary | ICD-10-CM

## 2018-10-08 DIAGNOSIS — I69398 Other sequelae of cerebral infarction: Secondary | ICD-10-CM

## 2018-10-08 DIAGNOSIS — E875 Hyperkalemia: Secondary | ICD-10-CM | POA: Diagnosis not present

## 2018-10-08 DIAGNOSIS — Z79899 Other long term (current) drug therapy: Secondary | ICD-10-CM

## 2018-10-08 DIAGNOSIS — I129 Hypertensive chronic kidney disease with stage 1 through stage 4 chronic kidney disease, or unspecified chronic kidney disease: Secondary | ICD-10-CM

## 2018-10-08 NOTE — Progress Notes (Signed)
   CC: hyperkalemia, AKI   HPI:  [name] is a 77 y.o. gentleman with PMHx of CKD III, hyporeninemic hypoaldosteronism, cerebellar stroke with residual left sided hemiballismus, HTN, DM who presents for follow-up after being admitted for acute on chronic kidney failure and hyperkalemia. During admission, Tradjenta and Lisinopril were discontinued, and his Florinef was increased to 0.1 mg BID. Since discharge, he reports feeling well and taking all of his medications as prescribed with appropriate adjustments.  Pill packs reviewed on today's visit which are consistent with medication list upon discharge.    Past Medical History:  Diagnosis Date  . Chronic kidney disease (CKD), stage III (moderate) (HCC)   . Constipation 11/07/2014  . Daily headache    "lately" (04/17/2016)  . Depression   . Dizziness 03/07/2016  . Headache 06/26/2014  . Heart murmur   . High cholesterol   . Hyperkalemia 08/2014  . Hyperkalemia 10/20/2014  . Hyperlipemia 11/11/2016  . Hypertension   . Neuropathic pain 02/06/2015  . PAD (peripheral artery disease) ([place])   . Paronychia of great toe, right   . Preop cardiovascular exam   . Right foot pain 02/09/2017  . Stable angina ([place]) 06/27/2014  . Stroke ([place]) 03/2016   hx of PAD; j"ust lots of headaches since" (04/17/2016)  . Vitamin B12 deficiency 06/27/2014  . Weight loss, unintentional 06/27/2014   Review of Systems:   Consitutional: negative for fevers, chills Cardio: negative for chest pain, palpitations, shortness of breath  GU: negative for hematuria, dysuria  Physical Exam:  Vitals:   10/08/18 1341  BP: 138/73  Pulse: 99  Temp: 97.9 F (36.6 C)  TempSrc: Oral  Weight: 213 lb 3.2 oz (96.7 kg)  Height: 6\' 2"  (1.88 m)   General: alert, pleasant gentleman, NAD CV: RRR; no murmurs, rubs or gallops  Pulm: normal respiratory effort; lungs CTA bilaterally Neuro: alert and oriented; left-sided hemiballismus   Assessment & Plan:   See Encounters  Tab for problem based charting.  Patient seen with Dr. Beryle Beams

## 2018-10-08 NOTE — Patient Instructions (Signed)
Mr. Fellers, It was nice meeting you! We will check your kidney function today to make sure it is stable. Be sure to continue taking all the medicines in your pill packets. Please call if you have any questions or concerns. Otherwise, we'll see you back with your regular primary care doctor follow-up.

## 2018-10-09 LAB — BMP8+ANION GAP
Anion Gap: 15 mmol/L (ref 10.0–18.0)
BUN / CREAT RATIO: 19 (ref 10–24)
BUN: 41 mg/dL — ABNORMAL HIGH (ref 8–27)
CHLORIDE: 111 mmol/L — AB (ref 96–106)
CO2: 17 mmol/L — ABNORMAL LOW (ref 20–29)
Calcium: 8.9 mg/dL (ref 8.6–10.2)
Creatinine, Ser: 2.18 mg/dL — ABNORMAL HIGH (ref 0.76–1.27)
GFR calc non Af Amer: 28 mL/min/{1.73_m2} — ABNORMAL LOW (ref >59)
GFR, EST AFRICAN AMERICAN: 32 mL/min/{1.73_m2} — AB (ref >59)
Glucose: 97 mg/dL (ref 65–99)
Potassium: 5.2 mmol/L (ref 3.5–5.2)
Sodium: 143 mmol/L (ref 134–144)

## 2018-10-10 ENCOUNTER — Encounter: Payer: Self-pay | Admitting: Internal Medicine

## 2018-10-10 NOTE — Progress Notes (Signed)
Medicine attending: I personally interviewed and briefly examined this patient on the day of the patient visit and reviewed pertinent clinical ,laboratory, and radiographic data  with resident physician Dr. Modena Nunnery and we discussed a management plan. Unfortunate development of a severe movement disorder subsequent to a cerebellar stroke with uncontrolled movements of his left side.Condition stable at this time post recent hospitalization. Continue current plan as detaile by Dr Koleen Distance.

## 2018-10-10 NOTE — Assessment & Plan Note (Signed)
BMET today shows normal potassium and improved creatinine since discharge (2.5>2.1). Will continue holding Lisinopril and Tradjenta. Follow-up in 4 weeks to continue monitoring.

## 2018-10-11 ENCOUNTER — Telehealth: Payer: Self-pay | Admitting: *Deleted

## 2018-10-11 ENCOUNTER — Ambulatory Visit: Payer: Medicare Other

## 2018-10-11 ENCOUNTER — Encounter: Payer: Self-pay | Admitting: *Deleted

## 2018-10-11 NOTE — Telephone Encounter (Signed)
Call from patient stating he was recently discharged from the hospital and had a nosebleed over the weekend.  Pt wanted to confirm which medication is the "blood thinner" and how he is suppose to be taking it.  Per pt's record, he is on apixaban 2.5mg  tabs take one tab 2 times daily.  I asked that pt spell medication (to confirm)-and he handed phone to male, she confirmed medication by name, but stated that pt's pill pack said to take 2 pills 2 times daily. I contacted pharmacy to confirm dose and dose was confirmed at correct dose of 2.5mg  tabs take one 2 times daily.  Pharmacy stated that pt had also contacted them inquiring about dose, but was not sure where he came up with incorrect instructions.  Call made back to patient x 2, no answer-message left on recorder.    Will need to make sure pt expresses clear understanding of apixaban dose.  Pt will also need hfu appt scheduled with instructions to bring in all medications.  Will attempt to reach pt back.Despina Hidden Cassady10/28/20192:07 PM

## 2018-10-12 NOTE — Telephone Encounter (Addendum)
Received return call from patient-went over eliquis instructions.  Pt given an appt for Thurs 10/31 for HFU and medication management.[name]

## 2018-10-14 ENCOUNTER — Ambulatory Visit (INDEPENDENT_AMBULATORY_CARE_PROVIDER_SITE_OTHER): Payer: Medicare Other | Admitting: *Deleted

## 2018-10-14 ENCOUNTER — Other Ambulatory Visit: Payer: Self-pay

## 2018-10-14 ENCOUNTER — Ambulatory Visit (INDEPENDENT_AMBULATORY_CARE_PROVIDER_SITE_OTHER): Payer: Medicare Other | Admitting: Internal Medicine

## 2018-10-14 ENCOUNTER — Encounter: Payer: Self-pay | Admitting: Internal Medicine

## 2018-10-14 DIAGNOSIS — I480 Paroxysmal atrial fibrillation: Secondary | ICD-10-CM | POA: Diagnosis not present

## 2018-10-14 DIAGNOSIS — Z87891 Personal history of nicotine dependence: Secondary | ICD-10-CM | POA: Diagnosis not present

## 2018-10-14 DIAGNOSIS — Z8673 Personal history of transient ischemic attack (TIA), and cerebral infarction without residual deficits: Secondary | ICD-10-CM | POA: Diagnosis not present

## 2018-10-14 DIAGNOSIS — E2749 Other adrenocortical insufficiency: Secondary | ICD-10-CM

## 2018-10-14 DIAGNOSIS — Z7901 Long term (current) use of anticoagulants: Secondary | ICD-10-CM | POA: Diagnosis not present

## 2018-10-14 DIAGNOSIS — I4891 Unspecified atrial fibrillation: Secondary | ICD-10-CM | POA: Insufficient documentation

## 2018-10-14 DIAGNOSIS — Z79899 Other long term (current) drug therapy: Secondary | ICD-10-CM

## 2018-10-14 LAB — CUP PACEART REMOTE DEVICE CHECK
Date Time Interrogation Session: 20191031164141
MDC IDC PG IMPLANT DT: 20170321

## 2018-10-14 IMAGING — MR MR MRA HEAD W/O CM
14 of 20 series · 20 of 48 positions shown · IV contrast (multihance)
Comparison: Prior CT from 03/07/2016.

CLINICAL DATA: Initial evaluation for

EXAM:
MRI HEAD WITHOUT CONTRAST
MRA HEAD WITHOUT CONTRAST
MRA NECK WITHOUT AND WITH CONTRAST
TECHNIQUE: Multiplanar, multiecho pulse sequences of the brain and surrounding
structures were obtained without intravenous contrast. Angiographic
images of the Circle of Willis were obtained using MRA technique
without intravenous contrast. Angiographic images of the neck were
obtained using MRA technique without and with intravenous contrast.
Carotid stenosis measurements (when applicable) are obtained
utilizing NASCET criteria, using the distal internal carotid
diameter as the denominator.
CONTRAST:  < 10 cc > MULTIHANCE GADOBENATE DIMEGLUMINE 529 MG/ML IV
SOLN

[Series 3: DWI · axial · 3.0mm · 1.09mm/px · 1 of 92 slices shown (1 of 4)]
[im 1/92]
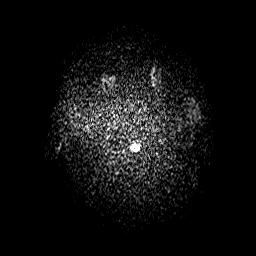

[Series 4: (id) mt fs · axial · 1.4mm · 0.43mm/px · z∈[-44,+50]mm · 3 of 136 slices shown (1 of 2)]
[im 1/136]
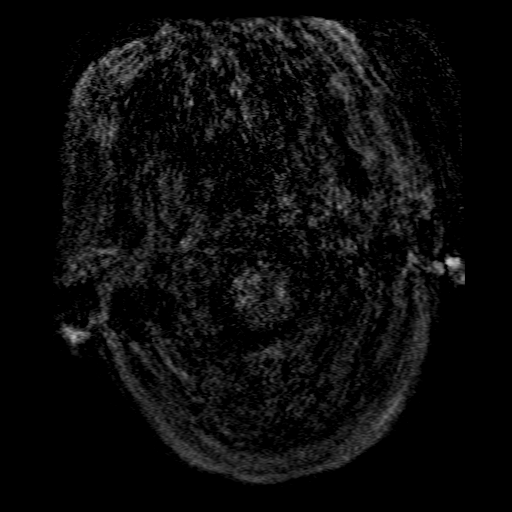
[im 68/136]
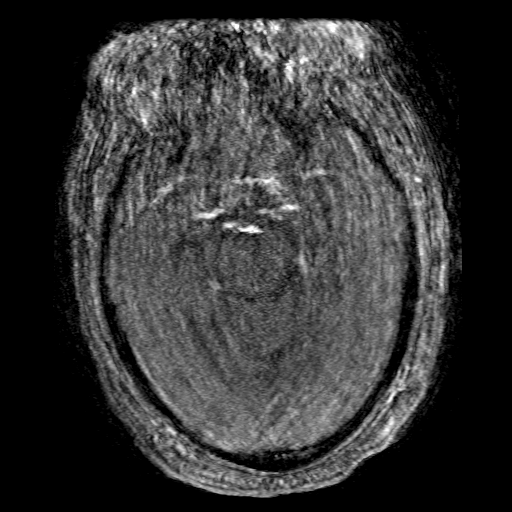
[im 136/136]
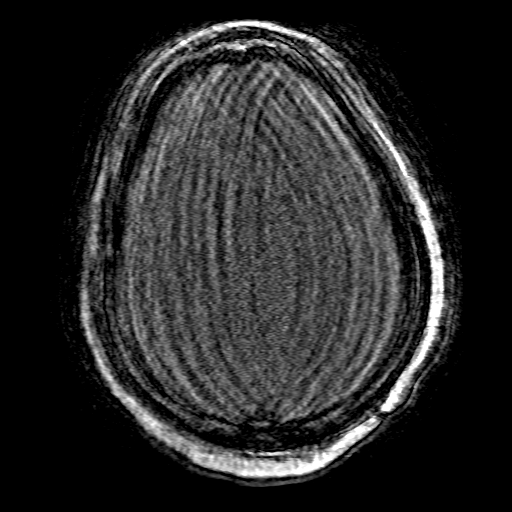

[Series 6: T1 · sagittal · 5.0mm · 0.94mm/px · 1 of 23 slices shown]
[im 1/23]
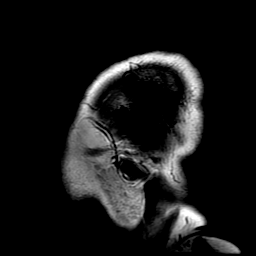

[Series 7: DWI · coronal · 5.0mm · 1.09mm/px · 1 of 76 slices shown (2 of 4)]
[im 1/76]
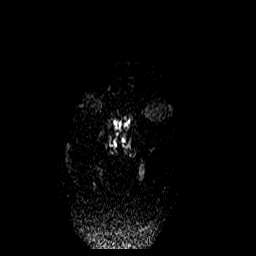

[Series 8: (id) mt fs · axial · 1.4mm · 0.43mm/px · z∈[-35,+58]mm · 3 of 136 slices shown (2 of 2)]
[im 1/136]
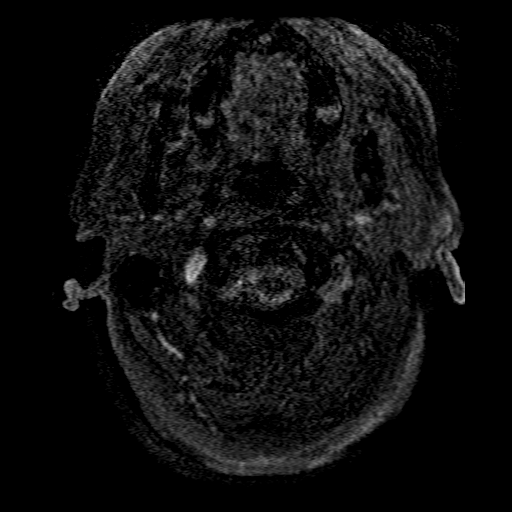
[im 68/136]
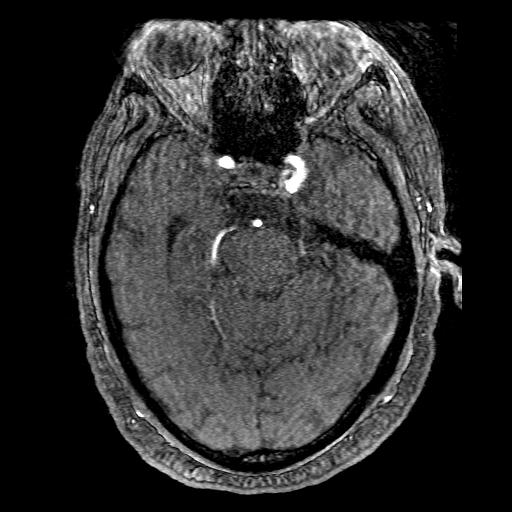
[im 136/136]
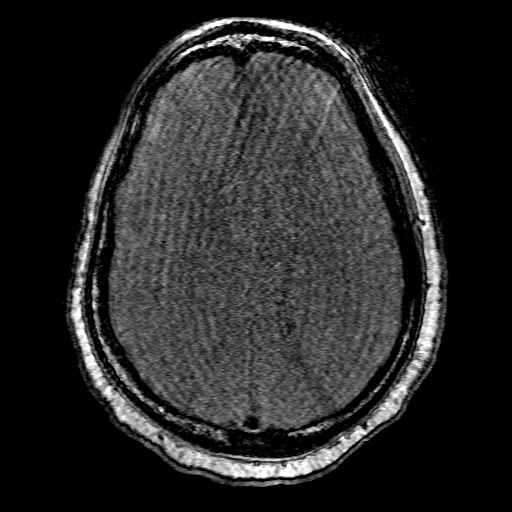

[Series 9: T2 · axial · 5.0mm · 0.43mm/px · 1 of 24 slices shown (1 of 2)]
[im 1/24]
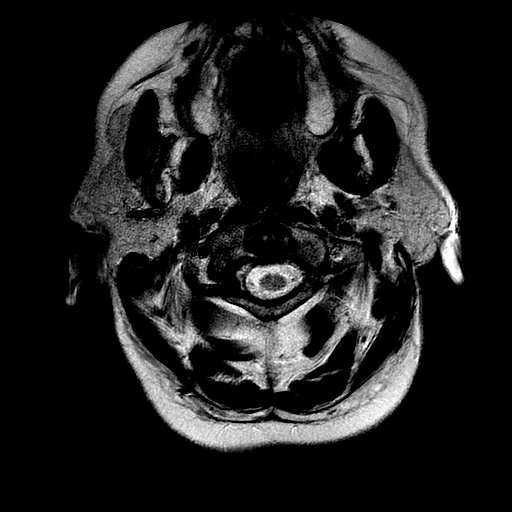

[Series 10: FLAIR · axial · 5.0mm · 0.43mm/px · 1 of 24 slices shown]
[im 1/24]
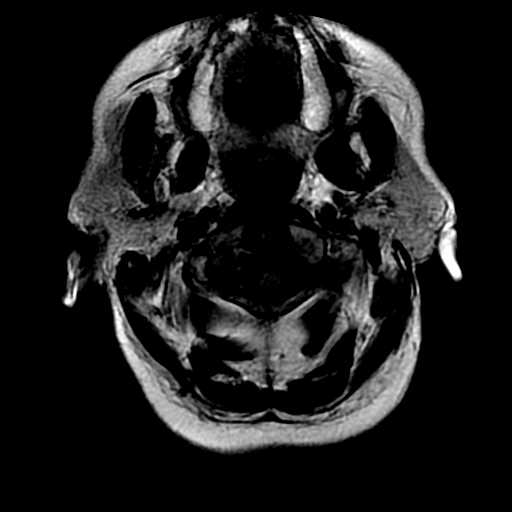

[Series 12: ax mpgr · axial · 5.0mm · 0.43mm/px · 1 of 24 slices shown]
[im 1/24]
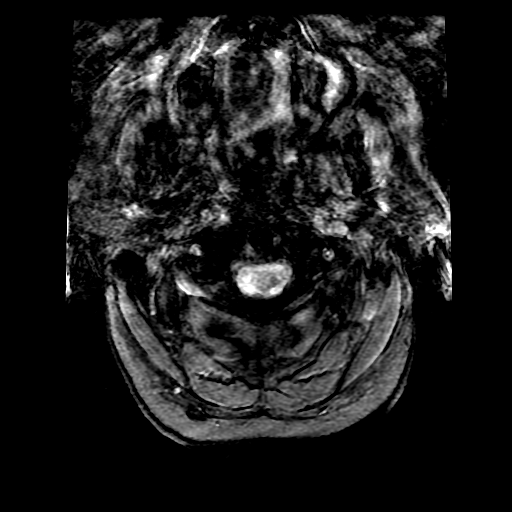

[Series 13: T2 · coronal · 5.0mm · 0.43mm/px · 1 of 33 slices shown (2 of 2)]
[im 1/33]
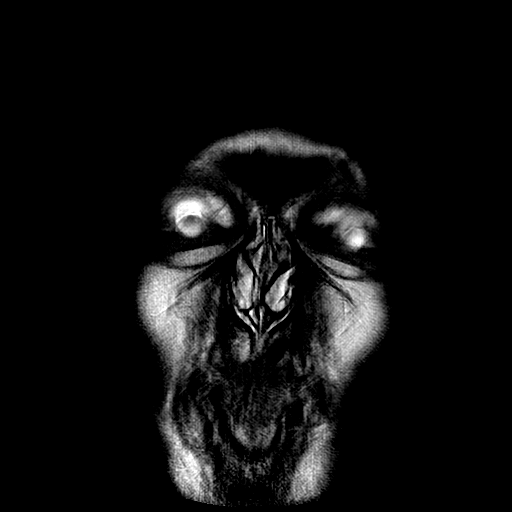

[Series 15: T2-star · axial · 5.0mm · 1.76mm/px · 1 of 41 slices shown]
[im 1/41]
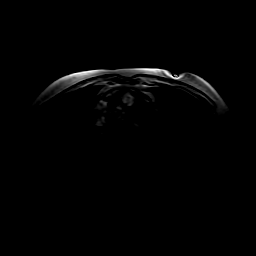

[Series 17: ax (id) · axial · 2.8mm · 0.47mm/px · z∈[-207,-59]mm · 3 of 108 slices shown]
[im 1/108]
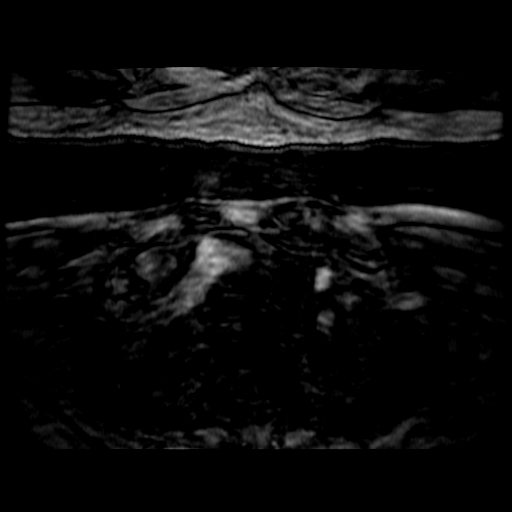
[im 54/108]
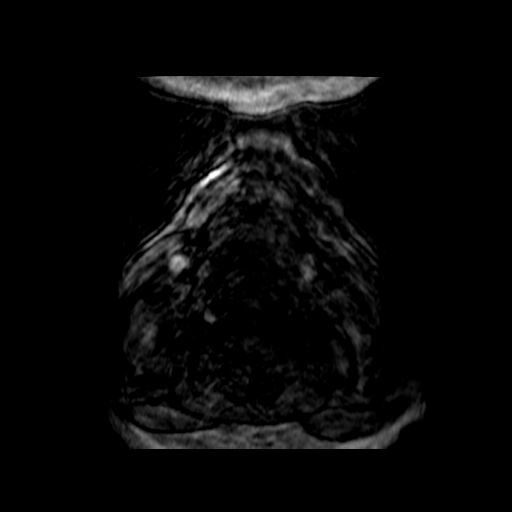
[im 108/108]
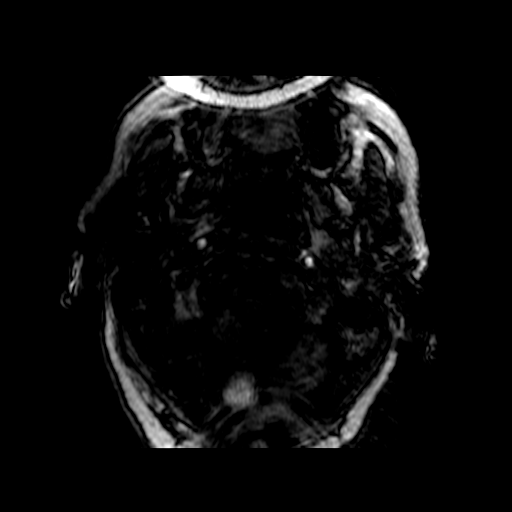

[Series 18: opt sag inhance · sagittal · 1.6mm · 0.66mm/px · 1 of 400 slices shown]
[im 1/400]
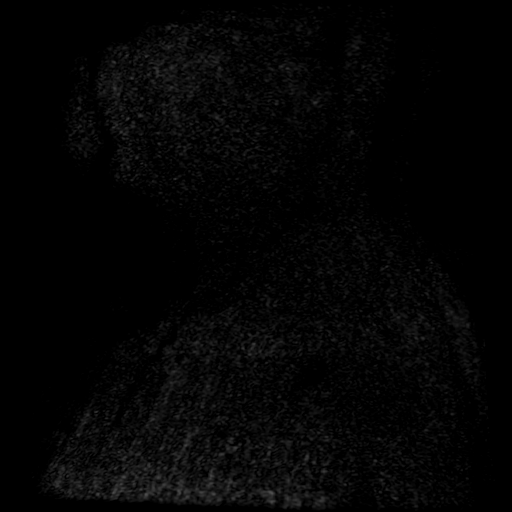

[Series 300: DWI · axial · 3.0mm · 1.09mm/px · 1 of 46 slices shown (3 of 4)]
[im 1/46]
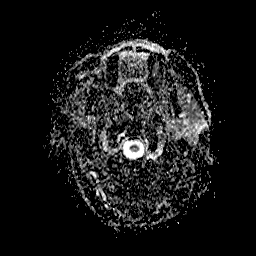

[Series 700: DWI · coronal · 5.0mm · 1.09mm/px · 1 of 38 slices shown (4 of 4)]
[im 1/38]
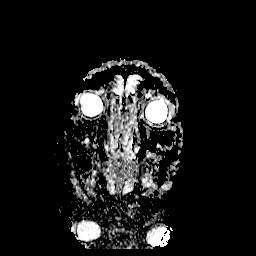

[20 of 48 positions shown; findings below may reference images not displayed]

FINDINGS: MRI HEAD FINDINGS

Study moderately degraded by motion artifact.

Diffuse prominence of the CSF containing spaces compatible with
generalized age-related cerebral atrophy. Patchy and confluent
T2/FLAIR hyperintensity within the periventricular and deep white
matter of both cerebral hemispheres most compatible chronic micro
vessel ischemic disease. Few remote superimposed lacunar infarcts
noted involving the bilateral basal ganglia/corona radiata. Patchy
remote left cerebellar infarcts noted.

No abnormal foci of restricted diffusion to suggest acute or
subacute ischemia. Gray-white matter differentiation maintained. No
evidence for acute intracranial hemorrhage. Single punctate chronic
microhemorrhage noted within the inferior right cerebellar
hemisphere, of doubtful significance in isolation. No other evidence
for chronic hemorrhage.

No mass lesion, midline shift or mass effect. No hydrocephalus. No
extra-axial fluid collection. Major dural sinuses grossly patent.

Pituitary gland suprasellar region normal.

Major intracranial vascular flow voids maintained.

Craniocervical junction normal. Reversal of the normal cervical
lordosis with multilevel degenerative spondylolysis noted within the
upper cervical spine. Bone marrow signal intensity normal. No scalp
soft tissue abnormality.

Visualized globes and orbital soft tissues within normal limits.
Paranasal sinuses are largely clear. No mastoid effusion. Inner ear
structures grossly normal.

MRA HEAD FINDINGS

ANTERIOR CIRCULATION:

Study degraded by motion artifact.

Distal cervical segments of the internal carotid artery's are widely
patent with antegrade flow. Petrous segments widely patent.
Atheromatous irregularity within the cavernous ICAs bilaterally
without flow-limiting stenosis. A1 segments patent bilaterally
without obvious stenosis. Anterior cerebral arteries demonstrate
multifocal atheromatous irregularity, but are patent to their distal
aspects without obvious flow-limiting stenosis.

M1 segments patent without stenosis or occlusion. No proximal M2
occlusion. Distal MCA branches well opacified and symmetric. Distal
small vessel atheromatous irregularity.

POSTERIOR CIRCULATION:

Right vertebral artery dominant and patent to the vertebrobasilar
junction without stenosis. Left vertebral artery hypoplastic and
likely terminates in PICA. Left PICA not well visualized, and may be
occluded given history of previous left PICA infarct. Patent right
PICA. Basilar artery mildly tortuous but patent to its distal aspect
without stenosis. Superior cerebral arteries patent proximally. Both
of the posterior cerebral artery supplied via the basilar and are
patent to their distal aspects. Superimposed short-segment moderate
left P2 stenosis. Distal small vessel atheromatous irregularity.

No aneurysm or vascular malformation.

MRA NECK FINDINGS

Study moderately degraded by motion artifact. Source images
reviewed.

Visualized aortic arch of normal caliber with normal 3 vessel
morphology. No flow-limiting stenosis seen about the origin of the
great vessels. Visualized subclavian artery is widely patent.

Right common carotid artery patent from its origin to the
bifurcation without flow-limiting stenosis. Atheromatous
irregularity about the right carotid bifurcation without definite
flow-limiting stenosis. Right ICA patent distally to the skullbase.

Left common carotid artery widely patent from its origin to the
bifurcation. Atheromatous irregularity about the left bifurcation
without flow-limiting stenosis. Left ICA tortuous but patent
distally to the skullbase without stenosis or occlusion.

Both of the vertebral arteries arise from the subclavian arteries.
Right vertebral artery dominant. Scattered atheromatous irregularity
within the right vertebral artery throughout the neck without
flow-limiting stenosis. Left vertebral artery diffusely hypoplastic
and not well visualize, but appears to be grossly patent to the
skullbase without occlusion. No obvious flow-limiting stenosis.
IMPRESSION: MRI HEAD IMPRESSION:

1. No acute intracranial infarct or other process identified.
2. Remote left cerebellar infarcts.
3. Generalized age-related cerebral atrophy with moderate chronic
microvascular ischemic disease with superimposed remote lacunar
infarcts involving the bilateral basal ganglia/corona radiata,
stable.

MRA HEAD IMPRESSION:

1. Stable intracranial MRA. No large or proximal arterial branch
occlusion. No high-grade or correctable stenosis.
2. Diminutive left vertebral artery terminating in PICA.
3. Scattered intracranial atherosclerotic changes involving the
medium and small arterial vessels as above.
MRA NECK IMPRESSION:

1. Motion degraded exam.
2. Carotid arteries patent within the neck without high-grade or
critical stenosis.
3. Widely patent dominant right vertebral artery.
4. Diffusely hypoplastic left vertebral artery, not well visualized,
but grossly patent within the neck.

## 2018-10-14 NOTE — Progress Notes (Signed)
   CC: medication concerns for treatment of his stroke recrudescence and A-fib  HPI:[name] is a 77 y.o. male who presents for evaluation of for medication confusion due to prior stroke and A-fib. Please see individual problem based A/P for details.  Paroxsymal A-fib: Patient continued on Eliquis 2.5mg  BID due to atrial fibrillation. He inquired as to the dosing regimen as his pill packs contained multiple doses of eliquis and he experienced a mild episode of epistaxis. It appears that the pill packs are appropriate and the self limited nose bleed was very mild and short in duration. He was confused by the twice daily dosing label on the eliquis and fludrocortisone being on both the AM and PM pill packs. He was afraid that he may be taking twice the recommended dosing.   Plan: He was advised that the packs were correct and that the labels were simply reprinted for each packet. IN addition, he was able to correctly read this information back to the clinician.  Hyporeninemic hypoaldosteronism: Continue on his current dose of fludrocortisone as he has been stable on this with his most recent BMP being acceptable. Will need to see his PCP in 2-6 wks. He denied dizziness, urinary changes, headache, visual changes, weakness or muscles aches.  Past Medical History:  Diagnosis Date  . Chronic kidney disease (CKD), stage III (moderate) (HCC)   . Constipation 11/07/2014  . Daily headache    "lately" (04/17/2016)  . Depression   . Dizziness 03/07/2016  . Headache 06/26/2014  . Heart murmur   . High cholesterol   . Hyperkalemia 08/2014  . Hyperkalemia 10/20/2014  . Hyperlipemia 11/11/2016  . Hypertension   . Neuropathic pain 02/06/2015  . PAD (peripheral artery disease) ([place])   . Paronychia of great toe, right   . Preop cardiovascular exam   . Right foot pain 02/09/2017  . Stable angina ([place]) 06/27/2014  . Stroke ([place]) 03/2016   hx of PAD; j"ust lots of headaches since" (04/17/2016)  .  Vitamin B12 deficiency 06/27/2014  . Weight loss, unintentional 06/27/2014   Review of Systems:  ROS negative except as per HPI.  Physical Exam: Vitals:   10/14/18 0841  BP: 126/74  Pulse: 86  Temp: 98 F (36.7 C)  TempSrc: Oral  SpO2: 98%  Weight: 213 lb 11.2 oz (96.9 kg)  Height: 6\' 2"  (1.88 m)   General: A/O x4, no acute distress, afebrile, nondiaphoretic Neuro: Patient continues to demonstrate ballismus of the left arm and leg Cardio: RRR, no MRG's Pulmonary: CTA bilaterally MSK: BLE nontender, nonedematous  Assessment & Plan:   See Encounters Tab for problem based charting.  Patient discussed with Dr. Daryll Drown

## 2018-10-14 NOTE — Assessment & Plan Note (Signed)
Paroxsymal A-fib: Patient continued on Eliquis 2.5mg  BID due to atrial fibrillation. He inquired as to the dosing regimen as his pill packs contained multiple doses of eliquis and he experienced a mild episode of epistaxis. It appears that the pill packs are appropriate and the self limited nose bleed was very mild and short in duration. He was confused by the twice daily dosing label on the eliquis and fludrocortisone being on both the AM and PM pill packs. He was afraid that he may be taking twice the recommended dosing.   Plan: He was advised that the packs were correct and that the labels were simply reprinted for each packet. IN addition, he was able to correctly read this information back to the clinician.

## 2018-10-14 NOTE — Patient Instructions (Signed)
FOLLOW-UP INSTRUCTIONS When: Please stop and schedule with Dr. Eileen Stanford in 2 months For: Routine visit What to bring: All of your medications  I have made not made any changes to your medications today. They appears to have been dispensed correctly. Please take them the way they are in the pill packets.   Thank you for your visit to the [facility] today. If you have any questions or concerns please call us at [phone].

## 2018-10-14 NOTE — Assessment & Plan Note (Addendum)
Hyporeninemic hypoaldosteronism: Continue on his current dose of fludrocortisone as he has been stable on this with his most recent BMP being acceptable. Will need to see his PCP in 2-6 wks. He denied dizziness, urinary changes, headache, visual changes, weakness or muscles aches.

## 2018-10-15 NOTE — Progress Notes (Signed)
Carelink Summary Report / Loop Recorder 

## 2018-10-20 NOTE — Telephone Encounter (Signed)
Patient completed HFU 10/08/2018. Hubbard Hartshorn, RN, BSN

## 2018-10-20 NOTE — Progress Notes (Signed)
Internal Medicine Clinic Attending  Case discussed with Dr. Harbrecht at the time of the visit.  We reviewed the resident's history and exam and pertinent patient test results.  I agree with the assessment, diagnosis, and plan of care documented in the resident's note.   

## 2018-10-27 ENCOUNTER — Ambulatory Visit: Payer: Medicare Other

## 2018-10-27 ENCOUNTER — Ambulatory Visit (HOSPITAL_COMMUNITY)
Admission: RE | Admit: 2018-10-27 | Discharge: 2018-10-27 | Disposition: A | Discharge status: Home / Self Care | Payer: Medicare Other | Source: Ambulatory Visit | Attending: Nurse Practitioner | Admitting: Nurse Practitioner

## 2018-10-27 ENCOUNTER — Encounter (HOSPITAL_COMMUNITY): Payer: Self-pay | Admitting: Nurse Practitioner

## 2018-10-27 VITALS — BP 114/50 | HR 88 | Ht 74.0 in | Wt 213.0 lb

## 2018-10-27 DIAGNOSIS — N183 Chronic kidney disease, stage 3 (moderate): Secondary | ICD-10-CM | POA: Insufficient documentation

## 2018-10-27 DIAGNOSIS — Z8249 Family history of ischemic heart disease and other diseases of the circulatory system: Secondary | ICD-10-CM | POA: Diagnosis not present

## 2018-10-27 DIAGNOSIS — Z7901 Long term (current) use of anticoagulants: Secondary | ICD-10-CM | POA: Insufficient documentation

## 2018-10-27 DIAGNOSIS — Z87891 Personal history of nicotine dependence: Secondary | ICD-10-CM | POA: Insufficient documentation

## 2018-10-27 DIAGNOSIS — E7800 Pure hypercholesterolemia, unspecified: Secondary | ICD-10-CM | POA: Diagnosis not present

## 2018-10-27 DIAGNOSIS — Z8673 Personal history of transient ischemic attack (TIA), and cerebral infarction without residual deficits: Secondary | ICD-10-CM | POA: Diagnosis not present

## 2018-10-27 DIAGNOSIS — Z79899 Other long term (current) drug therapy: Secondary | ICD-10-CM | POA: Diagnosis not present

## 2018-10-27 DIAGNOSIS — E875 Hyperkalemia: Secondary | ICD-10-CM | POA: Diagnosis not present

## 2018-10-27 DIAGNOSIS — I739 Peripheral vascular disease, unspecified: Secondary | ICD-10-CM | POA: Insufficient documentation

## 2018-10-27 DIAGNOSIS — I480 Paroxysmal atrial fibrillation: Secondary | ICD-10-CM | POA: Diagnosis not present

## 2018-10-27 DIAGNOSIS — R011 Cardiac murmur, unspecified: Secondary | ICD-10-CM | POA: Diagnosis not present

## 2018-10-27 DIAGNOSIS — I129 Hypertensive chronic kidney disease with stage 1 through stage 4 chronic kidney disease, or unspecified chronic kidney disease: Secondary | ICD-10-CM | POA: Diagnosis not present

## 2018-10-27 DIAGNOSIS — F329 Major depressive disorder, single episode, unspecified: Secondary | ICD-10-CM | POA: Diagnosis not present

## 2018-10-27 LAB — BASIC METABOLIC PANEL
ANION GAP: 7 (ref 5–15)
BUN: 29 mg/dL — ABNORMAL HIGH (ref 8–23)
CALCIUM: 8.9 mg/dL (ref 8.9–10.3)
CO2: 23 mmol/L (ref 22–32)
Chloride: 111 mmol/L (ref 98–111)
Creatinine, Ser: 2.08 mg/dL — ABNORMAL HIGH (ref 0.61–1.24)
GFR calc Af Amer: 33 mL/min — ABNORMAL LOW (ref >60)
GFR, EST NON AFRICAN AMERICAN: 29 mL/min — AB (ref >60)
Glucose, Bld: 115 mg/dL — ABNORMAL HIGH (ref 70–99)
POTASSIUM: 4.4 mmol/L (ref 3.5–5.1)
SODIUM: 141 mmol/L (ref 135–145)

## 2018-10-27 LAB — CBC
HCT: 38.7 % — ABNORMAL LOW (ref 39.0–52.0)
Hemoglobin: 11.2 g/dL — ABNORMAL LOW (ref 13.0–17.0)
MCH: 28.1 pg (ref 26.0–34.0)
MCHC: 28.9 g/dL — ABNORMAL LOW (ref 30.0–36.0)
MCV: 97 fL (ref 80.0–100.0)
NRBC: 0 % (ref 0.0–0.2)
PLATELETS: 316 10*3/uL (ref 150–400)
RBC: 3.99 MIL/uL — AB (ref 4.22–5.81)
RDW: 14.3 % (ref 11.5–15.5)
WBC: 7.5 10*3/uL (ref 4.0–10.5)

## 2018-10-27 NOTE — Progress Notes (Signed)
Primary Care Physician: [name], MD Referring Physician: Dr. Elta Guadeloupe Rosier is a 77 y.o. male with a h/o CKD, stage III,hyperkalemia, HTN, prior CVA, 02/2016, PAD, that is in the afib clinic to start anticoagulation for paroxysmal afib that was seen on LINQ, implanted at time of cryptogenic stroke. It took many attempts to get pt to the office on initial visit, 10/05/18.  He is a very poor historian and added little to the visit. nHis Kt was elevated at that visit and pt was taken to the Er and admitted for furhter treatment.  He is now back one month after being started on anticoagulation. He had one small nosebleed. No other issues with eliquis.   Today, he denies symptoms of palpitations, chest pain, shortness of breath, orthopnea, PND, lower extremity edema, dizziness, presyncope, syncope, or neurologic sequela. The patient is tolerating medications without difficulties and is otherwise without complaint today.   Past Medical History:  Diagnosis Date  . Chronic kidney disease (CKD), stage III (moderate) (HCC)   . Constipation 11/07/2014  . Daily headache    "lately" (04/17/2016)  . Depression   . Dizziness 03/07/2016  . Headache 06/26/2014  . Heart murmur   . High cholesterol   . Hyperkalemia 08/2014  . Hyperkalemia 10/20/2014  . Hyperlipemia 11/11/2016  . Hypertension   . Neuropathic pain 02/06/2015  . PAD (peripheral artery disease) ([place])   . Paronychia of great toe, right   . Preop cardiovascular exam   . Right foot pain 02/09/2017  . Stable angina ([place]) 06/27/2014  . Stroke ([place]) 03/2016   hx of PAD; j"ust lots of headaches since" (04/17/2016)  . Vitamin B12 deficiency 06/27/2014  . Weight loss, unintentional 06/27/2014   Past Surgical History:  Procedure Laterality Date  . ABDOMINAL AORTOGRAM W/LOWER EXTREMITY N/A 03/02/2017   Procedure: Abdominal Aortogram w/Lower Extremity;  Surgeon: Conrad Malaga, MD;  Location: [facility];  Service: Cardiovascular;   Laterality: N/A;  . EP IMPLANTABLE DEVICE N/A 03/04/2016   Procedure: Loop Recorder Insertion;  Surgeon: Thompson Grayer, MD;  Location: [facility];  Service: Cardiovascular;  Laterality: N/A;  . PERIPHERAL VASCULAR INTERVENTION Right 03/06/2017   Procedure: Peripheral Vascular Intervention;  Surgeon: Elam Dutch, MD;  Location: [facility];  Service: Cardiovascular;  Laterality: Right;  SFA    Current Outpatient Medications  Medication Sig Dispense Refill  . acetaminophen (TYLENOL) 500 MG tablet Take 2 tablets (1,000 mg total) by mouth every 6 (six) hours as needed for mild pain (or Fever >/= 101). 30 tablet 0  . amLODipine (NORVASC) 10 MG tablet TAKE 1 TABLET (10 MG TOTAL) BY MOUTH DAILY. 90 tablet 2  . apixaban (ELIQUIS) 2.5 MG TABS tablet Take 1 tablet (2.5 mg total) by mouth 2 (two) times daily. 60 tablet 6  . atorvastatin (LIPITOR) 80 MG tablet TAKE 1 TABLET (80 MG TOTAL) BY MOUTH DAILY. 90 tablet 10  . clonazePAM (KLONOPIN) 0.25 MG disintegrating tablet DISSOLVE ONE TABLET UNDER THE TONGUE THREE TIMES A DAY (Patient taking differently: Take 0.25 mg by mouth 3 (three) times daily. ) 90 tablet 0  . docusate sodium (COLACE) 100 MG capsule Take 1 capsule (100 mg total) by mouth daily. 90 capsule 3  . finasteride (PROSCAR) 5 MG tablet TAKE 1 TABLET (5 MG TOTAL) BY MOUTH DAILY. 90 tablet 10  . fludrocortisone (FLORINEF) 0.1 MG tablet Take 1 tablet (0.1 mg total) by mouth 2 (two) times daily. 90 tablet  3  . furosemide (LASIX) 40 MG tablet Take 40 mg by mouth daily.    [name] ipratropium (ATROVENT) 0.06 % nasal spray Place 2 sprays into the nose 3 (three) times daily. (Patient taking differently: Place 2 sprays into the nose daily as needed for rhinitis. ) 15 mL 2  . mupirocin ointment (BACTROBAN) 2 % Apply 1 application topically 2 (two) times daily. 22 g 0  . nitroGLYCERIN (NITROSTAT) 0.4 MG SL tablet Place 1 tablet (0.4 mg total) under the tongue every 5 (five) minutes x 3 doses as  needed for chest pain. 30 tablet 3  . senna (SENOKOT) 8.6 MG TABS tablet Take 1 tablet (8.6 mg total) by mouth 2 (two) times daily as needed for mild constipation. 180 each 3  . tamsulosin (FLOMAX) 0.4 MG CAPS capsule TAKE 1 CAPSULE (0.4 MG TOTAL) BY MOUTH DAILY AFTER SUPPER. 90 capsule 3   No current facility-administered medications for this encounter.     Allergies  Allergen Reactions  . Aspirin Other (See Comments)    Nosebleeds     Social History   Socioeconomic History  . Marital status: Single    Spouse name: Not on file  . Number of children: Not on file  . Years of education: Not on file  . Highest education level: Not on file  Occupational History  . Not on file  Social Needs  . Financial resource strain: Not on file  . Food insecurity:    Worry: Not on file    Inability: Not on file  . Transportation needs:    Medical: Not on file    Non-medical: Not on file  Tobacco Use  . Smoking status: Former Smoker    Years: 66.00    Types: Cigarettes    Start date: 06/23/2017  . Smokeless tobacco: Never Used  Substance and Sexual Activity  . Alcohol use: No    Alcohol/week: 0.0 standard drinks    Comment: 04/17/2016 "nothing in the last 3-4 years"  . Drug use: No  . Sexual activity: Never  Lifestyle  . Physical activity:    Days per week: Not on file    Minutes per session: Not on file  . Stress: Not on file  Relationships  . Social connections:    Talks on phone: Not on file    Gets together: Not on file    Attends religious service: Not on file    Active member of club or organization: Not on file    Attends meetings of clubs or organizations: Not on file    Relationship status: Not on file  . Intimate partner violence:    Fear of current or ex partner: Not on file    Emotionally abused: Not on file    Physically abused: Not on file    Forced sexual activity: Not on file  Other Topics Concern  . Not on file  Social History Narrative   Works in the yard         Family History  Problem Relation Age of Onset  . Hypertension Mother        died age 41  . Diabetes Mother     ROS- All systems are reviewed and negative except as per the HPI above  Physical Exam: Vitals:   10/27/18 0956  BP: (!) 114/50  Pulse: 88  Weight: 96.6 kg  Height: 6\' 2"  (1.88 m)   Wt Readings from Last 3 Encounters:  10/27/18 96.6 kg  10/14/18 96.9 kg  10/08/18  96.7 kg    Labs: Lab Results  Component Value Date   NA 143 10/08/2018   K 5.2 10/08/2018   CL 111 (H) 10/08/2018   CO2 17 (L) 10/08/2018   GLUCOSE 97 10/08/2018   BUN 41 (H) 10/08/2018   CREATININE 2.18 (H) 10/08/2018   CALCIUM 8.9 10/08/2018   PHOS 4.3 10/06/2018   MG 2.0 10/20/2014   Lab Results  Component Value Date   INR 1.01 03/06/2017   Lab Results  Component Value Date   CHOL 149 03/03/2016   HDL 41 03/03/2016   LDLCALC 95 03/03/2016   TRIG 65 03/03/2016     GEN- The patient is chronically ill appearing, alert and oriented x 3, with  poor hygiene  Head- normocephalic, atraumatic Eyes-  Sclera clear, conjunctiva pink Ears- hearing intact Oropharynx- clear Neck- supple, no JVP Lymph- no cervical lymphadenopathy Lungs- Clear to ausculation bilaterally, normal work of breathing Heart- Regular rate and rhythm, no murmurs, rubs or gallops, PMI not laterally displaced GI- soft, NT, ND, + BS Extremities- no clubbing, cyanosis, or edema, involuntary shaking of left extremities  MS- no significant deformity or atrophy Skin- no rash or lesion Psych- euthymic mood, full affect Neuro- strength and sensation are intact  EKG-NSR at 88  bpm, PR int 136 ms, qrs int 100 ms, qtc 433 ms Epic records reviewed    Assessment and Plan: 1. New onset asymptomatic afib per LInq monitor Poor historian No significant issues with anticoagultion WIth h/o prior stroke and now afib, plavix was stopped and eliquis 2.5 mg bid started( 77 yo in January with Creatinine over 1.5 ) Bmet/cbc  today to f/u new start of anticoaguatlion  2. Linq Per device clinic  3 CKD with h/o hyperkalemia Per [facility] He states that he has f/u with [facility] after this appointment today but Epic does not show this, but pt insists, pt taken by W/C to IM clinic   C. , [facility] [address] [place], [place] [address] [phone]

## 2018-11-01 ENCOUNTER — Other Ambulatory Visit: Payer: Self-pay | Admitting: Student in an Organized Health Care Education/Training Program

## 2018-11-01 ENCOUNTER — Other Ambulatory Visit: Payer: Self-pay | Admitting: Internal Medicine

## 2018-11-03 NOTE — Telephone Encounter (Signed)
Duplicate clonazepam request-previous request pending MD review.  Encounter closed for administrative purposes.Derrick Hendrix, Derrick Szatkowski Cassady11/20/20199:23 AM

## 2018-11-05 ENCOUNTER — Ambulatory Visit: Payer: Medicare Other

## 2018-11-05 ENCOUNTER — Encounter: Payer: Self-pay | Admitting: Internal Medicine

## 2018-11-16 ENCOUNTER — Ambulatory Visit (INDEPENDENT_AMBULATORY_CARE_PROVIDER_SITE_OTHER): Payer: Medicare Other

## 2018-11-16 DIAGNOSIS — I639 Cerebral infarction, unspecified: Secondary | ICD-10-CM | POA: Diagnosis not present

## 2018-11-16 NOTE — Addendum Note (Signed)
Addended by: Hulan Fray on: 11/16/2018 07:02 AM   Modules accepted: Orders

## 2018-11-17 NOTE — Progress Notes (Signed)
Carelink Summary Report / Loop Recorder 

## 2018-12-10 ENCOUNTER — Telehealth: Payer: Self-pay | Admitting: *Deleted

## 2018-12-10 NOTE — Telephone Encounter (Signed)
Call to [facility] on 12/06/2018 with information for PA for Clonazepam.  Response from [facility] no PA will be needed for the Clonazpam at this time.  Sander Nephew, RN 12/10/2018 11:52 AM.

## 2018-12-15 LAB — CUP PACEART REMOTE DEVICE CHECK
Date Time Interrogation Session: 20191203170939
Implantable Pulse Generator Implant Date: 20170321

## 2018-12-20 ENCOUNTER — Ambulatory Visit (INDEPENDENT_AMBULATORY_CARE_PROVIDER_SITE_OTHER): Payer: Medicare Other

## 2018-12-20 DIAGNOSIS — I639 Cerebral infarction, unspecified: Secondary | ICD-10-CM | POA: Diagnosis not present

## 2018-12-21 ENCOUNTER — Other Ambulatory Visit: Payer: Self-pay | Admitting: Internal Medicine

## 2018-12-21 LAB — CUP PACEART REMOTE DEVICE CHECK
Implantable Pulse Generator Implant Date: 20170321
MDC IDC SESS DTM: 20200105174113

## 2018-12-21 NOTE — Progress Notes (Signed)
Carelink Summary Report / Loop Recorder 

## 2019-01-04 ENCOUNTER — Telehealth: Payer: Self-pay

## 2019-01-04 NOTE — Telephone Encounter (Signed)
Called pt in an attempt to schedule 36 month f/u visit for the Stroke AF study. No answer, left voicemail.

## 2019-01-19 ENCOUNTER — Telehealth: Payer: Self-pay

## 2019-01-19 NOTE — Telephone Encounter (Signed)
Called pt in an attempt to schedule for his 36 mo f/u visit with [facility] for the Stroke AF study. Left voicemail to call back to schedule.  Dionne Bucy. [employer]

## 2019-01-21 ENCOUNTER — Ambulatory Visit (INDEPENDENT_AMBULATORY_CARE_PROVIDER_SITE_OTHER): Payer: Medicare Other

## 2019-01-21 DIAGNOSIS — I639 Cerebral infarction, unspecified: Secondary | ICD-10-CM

## 2019-01-23 LAB — CUP PACEART REMOTE DEVICE CHECK
MDC IDC PG IMPLANT DT: 20170321
MDC IDC SESS DTM: 20200207174039

## 2019-01-24 ENCOUNTER — Other Ambulatory Visit (HOSPITAL_COMMUNITY): Payer: Self-pay | Admitting: Internal Medicine

## 2019-01-26 ENCOUNTER — Other Ambulatory Visit: Payer: Self-pay | Admitting: Internal Medicine

## 2019-02-01 NOTE — Progress Notes (Signed)
Carelink Summary Report / Loop Recorder 

## 2019-02-15 ENCOUNTER — Telehealth: Payer: Self-pay | Admitting: *Deleted

## 2019-02-15 NOTE — Telephone Encounter (Signed)
Left message on pt's voice mail to complete a manuel transmission with loop for the [facility] research study. This is the last study requirement.

## 2019-02-17 ENCOUNTER — Encounter: Payer: Self-pay | Admitting: *Deleted

## 2019-02-17 DIAGNOSIS — Z006 Encounter for examination for normal comparison and control in clinical research program: Secondary | ICD-10-CM

## 2019-02-17 NOTE — Research (Signed)
STROKE~AF Research study: I called patient to with instructions on completing a manuel a manuel transmission on loop recorder for the research study. Patient states he will complete today. I informed him that this was the last visit required for the study. He states he "wants the loop removed because he thinks it has caused him to shake."  I informed patient I will notify Dr. Rayann Heman and get this arranged and will call him back with the information. I verbalizes understanding. I thanked him for his participation.

## 2019-02-23 ENCOUNTER — Ambulatory Visit (INDEPENDENT_AMBULATORY_CARE_PROVIDER_SITE_OTHER): Payer: Medicare Other | Admitting: *Deleted

## 2019-02-23 ENCOUNTER — Encounter: Payer: Self-pay | Admitting: Internal Medicine

## 2019-02-23 DIAGNOSIS — I639 Cerebral infarction, unspecified: Secondary | ICD-10-CM

## 2019-02-24 ENCOUNTER — Other Ambulatory Visit: Payer: Self-pay | Admitting: Internal Medicine

## 2019-02-24 LAB — CUP PACEART REMOTE DEVICE CHECK
Date Time Interrogation Session: 20200311194128
Implantable Pulse Generator Implant Date: 20170321

## 2019-02-28 ENCOUNTER — Other Ambulatory Visit: Payer: Self-pay | Admitting: Internal Medicine

## 2019-03-02 ENCOUNTER — Encounter: Payer: Medicare Other | Admitting: Internal Medicine

## 2019-03-02 NOTE — Progress Notes (Signed)
Carelink Summary Report / Loop Recorder 

## 2019-03-04 ENCOUNTER — Telehealth: Payer: Self-pay

## 2019-03-04 NOTE — Telephone Encounter (Signed)
Left message for patient to remind of missed remote transmission.  

## 2019-03-09 ENCOUNTER — Other Ambulatory Visit: Payer: Self-pay | Admitting: Internal Medicine

## 2019-03-28 ENCOUNTER — Other Ambulatory Visit: Payer: Self-pay

## 2019-03-28 ENCOUNTER — Encounter: Payer: Medicare Other | Admitting: *Deleted

## 2019-04-01 ENCOUNTER — Telehealth: Payer: Self-pay

## 2019-04-01 NOTE — Telephone Encounter (Signed)
Left message for patient to remind of missed remote transmission.  

## 2019-04-18 ENCOUNTER — Other Ambulatory Visit (HOSPITAL_COMMUNITY): Payer: Self-pay | Admitting: Nurse Practitioner

## 2019-04-18 ENCOUNTER — Other Ambulatory Visit: Payer: Self-pay | Admitting: Internal Medicine

## 2019-04-19 ENCOUNTER — Other Ambulatory Visit: Payer: Self-pay | Admitting: *Deleted

## 2019-04-19 ENCOUNTER — Other Ambulatory Visit: Payer: Self-pay | Admitting: Internal Medicine

## 2019-04-19 MED ORDER — FLUDROCORTISONE ACETATE 0.1 MG PO TABS: 0.1000 mg | ORAL_TABLET | Freq: Two times a day (BID) | ORAL | 0 refills | Status: DC

## 2019-04-19 NOTE — Telephone Encounter (Signed)
Clonazepam 0.25 rx called to [facility] which was not sent electronically but "Print".

## 2019-05-10 ENCOUNTER — Other Ambulatory Visit: Payer: Self-pay | Admitting: Internal Medicine

## 2019-05-10 DIAGNOSIS — I10 Essential (primary) hypertension: Secondary | ICD-10-CM

## 2019-05-16 ENCOUNTER — Telehealth: Payer: Self-pay

## 2019-05-16 NOTE — Telephone Encounter (Signed)
Left detailed message.  Advised Pt could have loop removed on June 10 if he was interested.  Left office # to return call.

## 2019-05-23 ENCOUNTER — Other Ambulatory Visit: Payer: Self-pay | Admitting: Internal Medicine

## 2019-05-23 DIAGNOSIS — R339 Retention of urine, unspecified: Secondary | ICD-10-CM

## 2019-05-26 ENCOUNTER — Other Ambulatory Visit: Payer: Self-pay | Admitting: Internal Medicine

## 2019-07-07 ENCOUNTER — Other Ambulatory Visit: Payer: Self-pay | Admitting: Internal Medicine

## 2019-07-11 ENCOUNTER — Other Ambulatory Visit: Payer: Self-pay | Admitting: *Deleted

## 2019-07-11 NOTE — Telephone Encounter (Signed)
Last visit with [facility] 09/26/2018, no future appts scheduled, message sent to front office for scheduling.Despina Hidden Cassady7/27/20201:23 PM

## 2019-07-12 MED ORDER — FLUDROCORTISONE ACETATE 0.1 MG PO TABS: 0.1000 mg | ORAL_TABLET | Freq: Two times a day (BID) | ORAL | 0 refills | Status: DC

## 2019-08-03 ENCOUNTER — Ambulatory Visit (INDEPENDENT_AMBULATORY_CARE_PROVIDER_SITE_OTHER): Payer: Medicare Other | Admitting: Internal Medicine

## 2019-08-03 ENCOUNTER — Encounter: Payer: Self-pay | Admitting: Internal Medicine

## 2019-08-03 VITALS — BP 146/88 | HR 89 | Temp 98.2°F | Wt 220.6 lb

## 2019-08-03 DIAGNOSIS — N183 Chronic kidney disease, stage 3 unspecified: Secondary | ICD-10-CM

## 2019-08-03 DIAGNOSIS — E1122 Type 2 diabetes mellitus with diabetic chronic kidney disease: Secondary | ICD-10-CM | POA: Diagnosis not present

## 2019-08-03 DIAGNOSIS — E2749 Other adrenocortical insufficiency: Secondary | ICD-10-CM

## 2019-08-03 DIAGNOSIS — G255 Other chorea: Secondary | ICD-10-CM

## 2019-08-03 DIAGNOSIS — I480 Paroxysmal atrial fibrillation: Secondary | ICD-10-CM

## 2019-08-03 DIAGNOSIS — I10 Essential (primary) hypertension: Secondary | ICD-10-CM | POA: Diagnosis not present

## 2019-08-03 DIAGNOSIS — I639 Cerebral infarction, unspecified: Secondary | ICD-10-CM

## 2019-08-03 LAB — POCT GLYCOSYLATED HEMOGLOBIN (HGB A1C): Hemoglobin A1C: 6.1 % — AB (ref 4.0–5.6)

## 2019-08-03 LAB — GLUCOSE, CAPILLARY: Glucose-Capillary: 100 mg/dL — ABNORMAL HIGH (ref 70–99)

## 2019-08-03 NOTE — Assessment & Plan Note (Signed)
Type 2 diabetes mellitus: Well-controlled.  I discontinued his Tradjenta when I evaluated him last year due to having a A1c of 5.7%.  Plan: - Continue lifestyle modifications -Hold off on diabetes medications for now -Follow-up A1c

## 2019-08-03 NOTE — Assessment & Plan Note (Signed)
Severe hemiballism after cerebellar infarct: Derrick Hendrix suffered a cerebral infarction in 2017 which is unfortunately led to severe embolism for which he takes scheduled Klonopin.  On physical exam today he still has rhythmic uncontrolled movement of his left upper extremity.  Plan: -Continue Klonopin 0.25 mg TID

## 2019-08-03 NOTE — Assessment & Plan Note (Signed)
Hypertension: This has been well controlled on amlodipine 10 mg daily.  He was previously on lisinopril however this was discontinued due to worsening renal failure due to his history of hypoaldosteronism.  He does tell me that recently he has been eating a lot of chips and cannot control his desire.  BP Readings from Last 3 Encounters:  08/03/19 (!) 146/88  10/27/18 (!) 114/50  10/14/18 126/74   I would refrain from adding on ACE inhibitor's.  If blood pressure continues to be consistently elevated, I could add verapamil or diltiazem for renal protection as he was discovered to have macroglobulinemia on blood work 2019 with microalbumin/creatinine ratio of 1095.  Plan: -Continue amlodipine 10 mg daily -If BP continues to remain elevated, can add verapamil or diltiazem due to microalbuminemia

## 2019-08-03 NOTE — Assessment & Plan Note (Signed)
Atrial fibrillation: He continues to remain on Eliquis 2.5 mg twice daily.  He is part of an ongoing stroke study with cardiology and neurology  Plan: - Continue Eliquis 2.5 mg twice daily - Loop recorder to be removed very soon

## 2019-08-03 NOTE — Assessment & Plan Note (Signed)
Hyporeninemic hypoaldosteronism: He was hospitalized last year from October 22 to October 23 with hyperkalemia due to ACE inhibitor use and his longstanding hyporeninemic hypoaldosteronism.  He remains on Florinef and serum potassium remains unremarkable.  Plan: -Continue Florinef 0.1 mg BID

## 2019-08-03 NOTE — Progress Notes (Signed)
   CC: Follow-up hypertension  HPI:  [name] is a 78 y.o. very pleasant African-American gentleman with medical history listed below presenting to follow-up on chronic medical problems.  Please see problem based charting for further details.  Past Medical History:  Diagnosis Date  . Abscess of foot without toes, left 08/28/2017  . Chronic kidney disease (CKD), stage III (moderate) (HCC)   . Constipation 11/07/2014  . Daily headache    "lately" (04/17/2016)  . Depression   . Dizziness 03/07/2016  . Headache 06/26/2014  . Heart murmur   . High cholesterol   . Hyperkalemia 08/2014  . Hyperkalemia 10/20/2014  . Hyperkalemia 10/05/2018  . Hyperlipemia 11/11/2016  . Hypertension   . Neuropathic pain 02/06/2015  . Osteomyelitis ([place])   . PAD (peripheral artery disease) ([place])   . Paronychia of great toe, right   . Preop cardiovascular exam   . Right foot pain 02/09/2017  . Stable angina ([place]) 06/27/2014  . Stroke ([place]) 03/2016   hx of PAD; j"ust lots of headaches since" (04/17/2016)  . Vitamin B12 deficiency 06/27/2014  . Weight loss, unintentional 06/27/2014   Review of Systems:   As per HPI  Physical Exam:  Vitals:   08/03/19 0959  BP: (!) 146/88  Pulse: 89  Temp: 98.2 F (36.8 C)  TempSrc: Oral  SpO2: 100%  Weight: 220 lb 9.6 oz (100.1 kg)   Physical Exam  Constitutional: He is well-developed, well-nourished, and in no distress.  HENT:  Head: Normocephalic and atraumatic.  Eyes: Conjunctivae are normal.  Cardiovascular: Normal rate, regular rhythm and normal heart sounds.  No murmur heard. Pulmonary/Chest: Effort normal and breath sounds normal. He has no wheezes. He has no rales.  Neurological:  Stable uncontrolled with hemic movements of the left upper extremity.  Skin:  Loop recorder at the Left upper chest  Nursing note and vitals reviewed.   Assessment & Plan:   See Encounters Tab for problem based charting.  Patient discussed with Dr. Lynnae January

## 2019-08-03 NOTE — Assessment & Plan Note (Signed)
CKD stage III: His GFR has slowly improved however serum creatinine has remained relatively stable at ~2.1  Plan: -Follow BMP

## 2019-08-03 NOTE — Patient Instructions (Signed)
Mr. Eppinger,  It was a pleasure taking care of you here and seeing you back at the clinic today.  I am concerned about the bag of chips you have been eating which has been causing your blood pressure to go up.  I would please advise that you cut back on the chips.  Please continue taking care of yourself and as we discussed I am getting blood work to check on your kidneys and your diabetes.  I would like to see you back in the clinic in about 3 months and if your blood pressure continues to remain elevated, we can add another blood pressure medication.  Take care! Dr. Eileen Stanford  Please call the internal medicine center clinic if you have any questions or concerns, we may be able to help and keep you from a long and expensive emergency room wait. Our clinic and after hours phone number is [phone], the best time to call is Monday through Friday 9 am to 4 pm but there is always someone available 24/7 if you have an emergency. If you need medication refills please notify your pharmacy one week in advance and they will send [place] a request.

## 2019-08-04 LAB — BMP8+ANION GAP
Anion Gap: 19 mmol/L — ABNORMAL HIGH (ref 10.0–18.0)
BUN/Creatinine Ratio: 10 (ref 10–24)
BUN: 22 mg/dL (ref 8–27)
CO2: 22 mmol/L (ref 20–29)
Calcium: 8.1 mg/dL — ABNORMAL LOW (ref 8.6–10.2)
Chloride: 105 mmol/L (ref 96–106)
Creatinine, Ser: 2.16 mg/dL — ABNORMAL HIGH (ref 0.76–1.27)
GFR calc Af Amer: 32 mL/min/{1.73_m2} — ABNORMAL LOW (ref >59)
GFR calc non Af Amer: 28 mL/min/{1.73_m2} — ABNORMAL LOW (ref >59)
Glucose: 97 mg/dL (ref 65–99)
Potassium: 3.1 mmol/L — ABNORMAL LOW (ref 3.5–5.2)
Sodium: 146 mmol/L — ABNORMAL HIGH (ref 134–144)

## 2019-08-05 NOTE — Progress Notes (Signed)
Internal Medicine Clinic Attending  Case discussed with Dr. [name] at the time of the visit.  We reviewed the resident's history and exam and pertinent patient test results.  I agree with the assessment, diagnosis, and plan of care documented in the resident's note.    

## 2019-08-08 ENCOUNTER — Other Ambulatory Visit: Payer: Self-pay | Admitting: Internal Medicine

## 2019-08-08 ENCOUNTER — Encounter: Payer: Self-pay | Admitting: Internal Medicine

## 2019-08-08 DIAGNOSIS — E876 Hypokalemia: Secondary | ICD-10-CM

## 2019-08-08 MED ORDER — POTASSIUM CHLORIDE ER 10 MEQ PO TBCR: 30.0000 meq | EXTENDED_RELEASE_TABLET | Freq: Every day | ORAL | 0 refills | Status: DC

## 2019-08-08 NOTE — Progress Notes (Signed)
I called Mr. Saber to discuss his laboratory results.  Overall shows stable creatinine however noted to have a serum potassium of 3.1.  I was unable to reach him but I did leave a voicemail regarding prescription for potassium and also presenting to the clinic for repeat lab after completing potassium supplements.

## 2019-08-10 ENCOUNTER — Encounter: Payer: Medicare Other | Admitting: Internal Medicine

## 2019-09-09 ENCOUNTER — Other Ambulatory Visit: Payer: Self-pay | Admitting: Internal Medicine

## 2019-09-15 ENCOUNTER — Encounter: Payer: Self-pay | Admitting: Internal Medicine

## 2019-10-10 ENCOUNTER — Other Ambulatory Visit (HOSPITAL_COMMUNITY): Payer: Self-pay | Admitting: Nurse Practitioner

## 2019-10-10 ENCOUNTER — Other Ambulatory Visit: Payer: Self-pay | Admitting: Internal Medicine

## 2019-11-07 ENCOUNTER — Other Ambulatory Visit: Payer: Self-pay | Admitting: Internal Medicine

## 2019-11-07 DIAGNOSIS — I10 Essential (primary) hypertension: Secondary | ICD-10-CM

## 2019-11-16 ENCOUNTER — Ambulatory Visit (INDEPENDENT_AMBULATORY_CARE_PROVIDER_SITE_OTHER): Payer: Medicare Other | Admitting: Internal Medicine

## 2019-11-16 ENCOUNTER — Encounter: Payer: Self-pay | Admitting: Internal Medicine

## 2019-11-16 VITALS — BP 154/134 | HR 84 | Temp 97.6°F | Ht 74.0 in | Wt 221.8 lb

## 2019-11-16 DIAGNOSIS — E2749 Other adrenocortical insufficiency: Secondary | ICD-10-CM | POA: Diagnosis not present

## 2019-11-16 DIAGNOSIS — I10 Essential (primary) hypertension: Secondary | ICD-10-CM

## 2019-11-16 DIAGNOSIS — I739 Peripheral vascular disease, unspecified: Secondary | ICD-10-CM | POA: Diagnosis not present

## 2019-11-16 DIAGNOSIS — E118 Type 2 diabetes mellitus with unspecified complications: Secondary | ICD-10-CM | POA: Diagnosis not present

## 2019-11-16 DIAGNOSIS — I639 Cerebral infarction, unspecified: Secondary | ICD-10-CM | POA: Diagnosis not present

## 2019-11-16 DIAGNOSIS — N1832 Chronic kidney disease, stage 3b: Secondary | ICD-10-CM

## 2019-11-16 LAB — POCT GLYCOSYLATED HEMOGLOBIN (HGB A1C): Hemoglobin A1C: 6 % — AB (ref 4.0–5.6)

## 2019-11-16 LAB — GLUCOSE, CAPILLARY: Glucose-Capillary: 94 mg/dL (ref 70–99)

## 2019-11-16 NOTE — Progress Notes (Signed)
   CC: Follow up Hypertension   HPI:  [name] is a 78 y.o. with medical history listed below presenting to follow-up on chronic medical problems.  Please see problem based charting for further details.  Past Medical History:  Diagnosis Date  . Abscess of foot without toes, left 08/28/2017  . Chronic kidney disease (CKD), stage III (moderate)   . Constipation 11/07/2014  . Daily headache    "lately" (04/17/2016)  . Depression   . Dizziness 03/07/2016  . Headache 06/26/2014  . Heart murmur   . High cholesterol   . Hyperkalemia 08/2014  . Hyperkalemia 10/20/2014  . Hyperkalemia 10/05/2018  . Hyperlipemia 11/11/2016  . Hypertension   . Neuropathic pain 02/06/2015  . Osteomyelitis ([place])   . PAD (peripheral artery disease) ([place])   . Paronychia of great toe, right   . Preop cardiovascular exam   . Right foot pain 02/09/2017  . Stable angina ([place]) 06/27/2014  . Stroke ([place]) 03/2016   hx of PAD; j"ust lots of headaches since" (04/17/2016)  . Vitamin B12 deficiency 06/27/2014  . Weight loss, unintentional 06/27/2014   Review of Systems: As per HPI  Physical Exam:  Vitals:   11/16/19 1022  BP: (!) 154/134  Pulse: 84  Temp: 97.6 F (36.4 C)  TempSrc: Oral  SpO2: 100%  Weight: 221 lb 12.8 oz (100.6 kg)  Height: 6\' 2"  (1.88 m)   Physical Exam  Constitutional: He is well-developed, well-nourished, and in no distress.  HENT:  Head: Normocephalic and atraumatic.  Cardiovascular: Normal rate and normal heart sounds. Exam reveals no friction rub.  No murmur heard. Pulmonary/Chest: Effort normal and breath sounds normal.  Musculoskeletal:        General: No deformity or edema.  Neurological:  Chronic hemiballism    Assessment & Plan:   See Encounters Tab for problem based charting.  Patient discussed with Dr. Heber Kwigillingok

## 2019-11-16 NOTE — Assessment & Plan Note (Addendum)
Hyporeninemic hypoaldosteronism: This has been quite difficult to control.  He is currently on Florinef 0.5 mg twice a day.  During my last visit in August, he was found to be hypokalemic at 3.1.  He had previously been on Florinef 0.1 mg twice daily plus lisinopril however was found to be hyperkalemic due to superimposed ACE inhibitor use which was subsequently discontinued during his hospitalization from October 22 to October 07, 2019.  Plan: -Continue Florinef 0.1 mg twice a day -Follow-up BMP  *If hypokalemic, will decrease dose of Florinef to 0.1 mg once a day  *If hyperkalemic, will continue Florinef 0.1 mg twice a day   ADDENDUM:  K+ is unremarkable and will have him continue current dose of Florinef. If needed, I will add a low dose lisinopril every other day for his blood pressure. Recent BMP revealed pre-renal azotemia so I am having him hold his Lasix for 3-4 days and have repeat labs next weeks

## 2019-11-16 NOTE — Assessment & Plan Note (Signed)
Hypertension: BP elevated in the clinic today at 154/134, with repeat of 164/79.  He is currently on amlodipine 10 mg daily, Lasix 40 mg daily, tamsulosin 0.4 mg daily.  He was previously on Lasix however due to hyperkalemia, it was discontinued.  My concern is that his uncontrolled hypertension could be his due to increased mineralocorticoid excess from his current dose of Florinef.  Plan: -Continue amlodipine 10 mg daily -Continue Lasix 40 mg daily -Continue tamsulosin 0.4 mg daily -Await BMP results  *If hypokalemic, will decrease dose of Florinef to 0.1 mg once a day  *If hyperkalemic, will continue Florinef 0.1 mg twice a day  *If serum potassium is normal, will add very low-dose ACE inhibitor every other day

## 2019-11-16 NOTE — Addendum Note (Signed)
Addended by: Truddie Crumble on: 11/16/2019 04:47 PM   Modules accepted: Orders

## 2019-11-16 NOTE — Assessment & Plan Note (Addendum)
CKD stage III: His creatinine has been stable at approximately 2.  He was noted to have an elevated urine microalbumin to creatinine ratio of 1095 in October 2019.  Plan: -Follow BMP -Follow-up urine microalbumin to creatinine ratio   ADDENDUM: BMP shows pre-renal azotemia. I will have him hold his lasix for 3-4 days and present to clinic for repeat BMP next week.

## 2019-11-16 NOTE — Assessment & Plan Note (Signed)
Peripheral arterial disease: Derrick Hendrix has a prior history of severe PAD and is status post bilateral SFA stenting with resultant in-line flow to below-knee popliteal artery.  Today, he reports of right thigh pain/cramp with onset about 4 months ago.  He states the pain/cramp worsens each time he moves his right thigh.  Given his history of PAD, I am concerned about a possible vascular component that could be exacerbating his pain.  It seems that he was previously on cilostazol at least up until 2018, however I do not see it on his medication list.  Perhaps it was discontinued after his vascular procedure.  Plan: -Referred to vascular surgery -Follow up BMP to r/o Hypokalemia

## 2019-11-16 NOTE — Patient Instructions (Signed)
Derrick Hendrix,   It was a pleasure taking care of you in the clinic today.  I am repeating blood work and based on the results, we will make medication adjustments.  I have also referred you to the foot doctor.  Take care!

## 2019-11-17 LAB — BMP8+ANION GAP
Anion Gap: 18 mmol/L (ref 10.0–18.0)
BUN/Creatinine Ratio: 14 (ref 10–24)
BUN: 38 mg/dL — ABNORMAL HIGH (ref 8–27)
CO2: 20 mmol/L (ref 20–29)
Calcium: 8.3 mg/dL — ABNORMAL LOW (ref 8.6–10.2)
Chloride: 105 mmol/L (ref 96–106)
Creatinine, Ser: 2.73 mg/dL — ABNORMAL HIGH (ref 0.76–1.27)
GFR calc Af Amer: 24 mL/min/{1.73_m2} — ABNORMAL LOW (ref >59)
GFR calc non Af Amer: 21 mL/min/{1.73_m2} — ABNORMAL LOW (ref >59)
Glucose: 98 mg/dL (ref 65–99)
Potassium: 4 mmol/L (ref 3.5–5.2)
Sodium: 143 mmol/L (ref 134–144)

## 2019-11-18 ENCOUNTER — Telehealth: Payer: Self-pay | Admitting: Internal Medicine

## 2019-11-18 NOTE — Telephone Encounter (Signed)
Called Mr. Pohle but was unable to reach him. I did leave a vm. I will also send a letter as well  His BMP shows pre-renal azotemia. I will have him hold his lasix for 3-4 days and present to clinic for repeat BMP next week. K+ is unremarkable and will have him continue current dose of Florinef. If needed, I will add a low dose lisinopril every other day for his blood pressure.

## 2019-11-21 NOTE — Progress Notes (Signed)
Internal Medicine Clinic Attending  Case discussed with Dr. [name] at the time of the visit.  We reviewed the resident's history and exam and pertinent patient test results.  I agree with the assessment, diagnosis, and plan of care documented in the resident's note.    

## 2019-11-22 ENCOUNTER — Encounter: Payer: Self-pay | Admitting: Internal Medicine

## 2019-11-22 NOTE — Progress Notes (Signed)
Letter sent.

## 2019-11-30 ENCOUNTER — Encounter: Payer: Self-pay | Admitting: *Deleted

## 2019-11-30 ENCOUNTER — Telehealth: Payer: Self-pay

## 2019-11-30 NOTE — Telephone Encounter (Signed)
LMOM for pt to call back if interested in having loop recorder removed in office.

## 2019-11-30 NOTE — Telephone Encounter (Signed)
-----   Message from Thompson Grayer, MD sent at 11/28/2019  5:38 PM EST ----- Try him again to see if he wants his LINQ taken out in office

## 2019-12-05 NOTE — Telephone Encounter (Signed)
LMOM for pt to call back if interested in having loop recorder removed in office.

## 2019-12-07 ENCOUNTER — Encounter: Payer: Self-pay | Admitting: Internal Medicine

## 2019-12-07 NOTE — Telephone Encounter (Signed)
LMOM (both listed numbers in chart) for pt to call back and schedule appt to have loop removed. Also mailed patient a letter asking for a call back.

## 2019-12-14 ENCOUNTER — Other Ambulatory Visit: Payer: Self-pay | Admitting: Internal Medicine

## 2019-12-14 DIAGNOSIS — R339 Retention of urine, unspecified: Secondary | ICD-10-CM

## 2019-12-28 NOTE — Addendum Note (Signed)
Addended by: Hulan Fray on: 12/28/2019 07:35 PM   Modules accepted: Orders

## 2019-12-28 NOTE — Addendum Note (Signed)
Addended by: Hulan Fray on: 12/28/2019 07:31 PM   Modules accepted: Orders

## 2020-01-06 ENCOUNTER — Other Ambulatory Visit: Payer: Self-pay | Admitting: Internal Medicine

## 2020-02-15 ENCOUNTER — Other Ambulatory Visit: Payer: Self-pay | Admitting: Internal Medicine

## 2020-02-15 DIAGNOSIS — R339 Retention of urine, unspecified: Secondary | ICD-10-CM

## 2020-02-20 ENCOUNTER — Ambulatory Visit (INDEPENDENT_AMBULATORY_CARE_PROVIDER_SITE_OTHER): Payer: Medicare Other | Admitting: Internal Medicine

## 2020-02-20 ENCOUNTER — Ambulatory Visit: Payer: Medicare Other | Admitting: Dietician

## 2020-02-20 ENCOUNTER — Encounter: Payer: Self-pay | Admitting: Dietician

## 2020-02-20 ENCOUNTER — Encounter: Payer: Self-pay | Admitting: Internal Medicine

## 2020-02-20 VITALS — BP 151/65 | HR 89 | Temp 97.7°F | Ht 74.0 in | Wt 220.0 lb

## 2020-02-20 DIAGNOSIS — Z23 Encounter for immunization: Secondary | ICD-10-CM | POA: Diagnosis not present

## 2020-02-20 DIAGNOSIS — E2749 Other adrenocortical insufficiency: Secondary | ICD-10-CM | POA: Diagnosis not present

## 2020-02-20 DIAGNOSIS — I63532 Cerebral infarction due to unspecified occlusion or stenosis of left posterior cerebral artery: Secondary | ICD-10-CM | POA: Diagnosis not present

## 2020-02-20 DIAGNOSIS — E1121 Type 2 diabetes mellitus with diabetic nephropathy: Secondary | ICD-10-CM

## 2020-02-20 DIAGNOSIS — R040 Epistaxis: Secondary | ICD-10-CM | POA: Diagnosis not present

## 2020-02-20 DIAGNOSIS — I10 Essential (primary) hypertension: Secondary | ICD-10-CM | POA: Diagnosis not present

## 2020-02-20 DIAGNOSIS — R42 Dizziness and giddiness: Secondary | ICD-10-CM | POA: Diagnosis not present

## 2020-02-20 LAB — POCT GLYCOSYLATED HEMOGLOBIN (HGB A1C): Hemoglobin A1C: 5.5 % (ref 4.0–5.6)

## 2020-02-20 LAB — GLUCOSE, CAPILLARY: Glucose-Capillary: 165 mg/dL — ABNORMAL HIGH (ref 70–99)

## 2020-02-20 NOTE — Patient Instructions (Addendum)
Mr. Sigmund,   Thanks for seeing [place] today. I am obtaining labs today and will call you with the results.   I will also refer you to other resources in the community to assist you.  Take care! Dr. Eileen Stanford  Please call the internal medicine center clinic if you have any questions or concerns, we may be able to help and keep you from a long and expensive emergency room wait. Our clinic and after hours phone number is [phone], the best time to call is Monday through Friday 9 am to 4 pm but there is always someone available 24/7 if you have an emergency. If you need medication refills please notify your pharmacy one week in advance and they will send [place] a request.

## 2020-02-20 NOTE — Assessment & Plan Note (Signed)
Type 2 diabetes mellitus: Well-controlled with lifestyle modification.  A1c today 5.5<<6% in December 2020  Plan: -Continue lifestyle modifications

## 2020-02-20 NOTE — Assessment & Plan Note (Addendum)
Epistasis: He states that about 2-3 weeks ago he experienced nosebleeds each time he blows his nose but this spontaneously resolved.  He is on Eliquis for atrial fibrillation.  Since his epistasis has resolved, I do not see any indication for discontinuing or holding his Eliquis. On PE today, his HR was regular on palpation of radial pulses. I think we have room to hold or d/c eliquis in the future if epistasis recurs.   Plan: -Follow-up CBC

## 2020-02-20 NOTE — Progress Notes (Signed)
   CC: Follow-up hypertension  HPI:  [name] is a 79 y.o. community dwelling gentleman with medical history listed below presenting to follow-up on chronic medical problems.  Please see problem based charting for further details.  Past Medical History:  Diagnosis Date  . Abscess of foot without toes, left 08/28/2017  . Chronic kidney disease (CKD), stage III (moderate)   . Constipation 11/07/2014  . Daily headache    "lately" (04/17/2016)  . Depression   . Dizziness 03/07/2016  . Headache 06/26/2014  . Heart murmur   . High cholesterol   . Hyperkalemia 08/2014  . Hyperkalemia 10/20/2014  . Hyperkalemia 10/05/2018  . Hyperlipemia 11/11/2016  . Hypertension   . Neuropathic pain 02/06/2015  . Osteomyelitis ([place])   . PAD (peripheral artery disease) ([place])   . Paronychia of great toe, right   . Preop cardiovascular exam   . Right foot pain 02/09/2017  . Stable angina ([place]) 06/27/2014  . Stroke ([place]) 03/2016   hx of PAD; j"ust lots of headaches since" (04/17/2016)  . Vitamin B12 deficiency 06/27/2014  . Weight loss, unintentional 06/27/2014   Review of Systems:   Review of Systems  Neurological: Positive for dizziness (When bends head). Negative for headaches.       Chronic hemiballism     Physical Exam:  Vitals:   02/20/20 1316  BP: (!) 151/65  Pulse: 89  Temp: 97.7 F (36.5 C)  TempSrc: Oral  SpO2: 100%  Weight: 220 lb (99.8 kg)  Height: 6\' 2"  (1.88 m)   Physical Exam  Constitutional: No distress.  Cardiovascular: Normal rate.  No murmur heard. Pulmonary/Chest: Effort normal and breath sounds normal. No respiratory distress. He has no wheezes.  Neurological:  Chronic hemiballism  Skin: He is not diaphoretic.  Psychiatric: Mood and affect normal.    Assessment & Plan:   See Encounters Tab for problem based charting.  Patient discussed with Dr. Evette Doffing

## 2020-02-20 NOTE — Assessment & Plan Note (Signed)
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  In the past, he was on lisinopril however this was complicated by hyperkalemia which required hospitalization and thus lisinopril was discontinued in October 2020.  His last BMP in December 2020 showed serum potassium of 4.  Assessment: He seems to be doing fairly well with his adjusted dose of Florinef and it is now complicating his hypertension or electrolytes.  Plan: -Continue Florinef 0.1 mg daily -BMP

## 2020-02-20 NOTE — Assessment & Plan Note (Signed)
Dizziness: Derrick Hendrix reports that since he last saw me in December, he has been experiencing dizziness each time he bends over.  He denies vertigo or dizziness with upstanding.  He also does not report of any syncopal episodes or recent infection.he tells me that he stays cold and uses wood fire to stay well.  Sometimes he was alternate sleeping between 2 rooms.  He does not report of migraine but states that sometimes the left side of face head will get cloudy when he bends over.  He reports that he has changed his diet recently and has been eating less.  In the morning, he would have a toast, coffee and bologna.  In the afternoon he has vegetarian soup and skips dinner.  He reports that he keeps up with his fluid intake and drinks about a gallon of water a day.  Initial BP: 151/65, pulse 89 BP on standing: 144/94, pulse 95  Assessment: Etiology of his dizziness is currently unclear though reassuring that is not causing any life-threatening emergencies.  His orthostatics vitals are unremarkable.  He does have a prior history of CVA which unfortunately resulted in chronic debilitating hemiballismus controlled with Klonopin and I am wondering if his prior neurological insult could be playing a role with his ongoing dizziness. He states that he can take care of himself and his cats at home  Plan: -Trial of vestibular rehab -Advised to keep up with oral intake

## 2020-02-20 NOTE — Progress Notes (Signed)
Unable to obtain adequate retinal images to transmit, recommend ophthalmologist referral.  Thea Gist Dietetic Intern 03.09.2021

## 2020-02-20 NOTE — Assessment & Plan Note (Signed)
Hypertension: Fairly controlled.   BP Readings from Last 3 Encounters:  02/20/20 (!) 151/65  11/16/19 (!) 154/134  08/03/19 (!) 146/88   Repeat BP on upstanding was 143/72 with pulse of 95  Plan: -Continue amlodipine 10 mg daily -Continue Lasix 40 mg daily -Continue tamsulosin 0.4 mg daily -Will consider adding lisinopril 2.5 mg daily if BP severely uncontrolled in the future -Follow BMP

## 2020-02-21 NOTE — Progress Notes (Signed)
I reviewed and approve this note and the plan. Debera Lat, RD 02/21/2020 10:37 AM.

## 2020-02-21 NOTE — Progress Notes (Signed)
Internal Medicine Clinic Attending  Case discussed with Dr. [name] at the time of the visit.  We reviewed the resident's history and exam and pertinent patient test results.  I agree with the assessment, diagnosis, and plan of care documented in the resident's note.    

## 2020-02-27 NOTE — Addendum Note (Signed)
Addended by: Truddie Crumble on: 02/27/2020 04:12 PM   Modules accepted: Orders

## 2020-03-01 ENCOUNTER — Other Ambulatory Visit (INDEPENDENT_AMBULATORY_CARE_PROVIDER_SITE_OTHER): Payer: Medicare Other

## 2020-03-01 DIAGNOSIS — E1121 Type 2 diabetes mellitus with diabetic nephropathy: Secondary | ICD-10-CM

## 2020-03-02 ENCOUNTER — Inpatient Hospital Stay (HOSPITAL_COMMUNITY)
Admission: EM | Admit: 2020-03-02 | Discharge: 2020-03-04 | DRG: 683 | Disposition: A | Discharge status: Home / Self Care | Payer: Medicare Other | Source: Ambulatory Visit | Attending: Internal Medicine | Admitting: Internal Medicine

## 2020-03-02 ENCOUNTER — Other Ambulatory Visit: Payer: Self-pay

## 2020-03-02 ENCOUNTER — Telehealth: Payer: Self-pay | Admitting: Internal Medicine

## 2020-03-02 ENCOUNTER — Encounter (HOSPITAL_COMMUNITY): Payer: Self-pay | Admitting: *Deleted

## 2020-03-02 DIAGNOSIS — Z7901 Long term (current) use of anticoagulants: Secondary | ICD-10-CM

## 2020-03-02 DIAGNOSIS — N138 Other obstructive and reflux uropathy: Secondary | ICD-10-CM

## 2020-03-02 DIAGNOSIS — E2740 Unspecified adrenocortical insufficiency: Secondary | ICD-10-CM | POA: Diagnosis present

## 2020-03-02 DIAGNOSIS — I480 Paroxysmal atrial fibrillation: Secondary | ICD-10-CM | POA: Diagnosis present

## 2020-03-02 DIAGNOSIS — Z87891 Personal history of nicotine dependence: Secondary | ICD-10-CM

## 2020-03-02 DIAGNOSIS — Z20822 Contact with and (suspected) exposure to covid-19: Secondary | ICD-10-CM | POA: Diagnosis present

## 2020-03-02 DIAGNOSIS — N179 Acute kidney failure, unspecified: Principal | ICD-10-CM

## 2020-03-02 DIAGNOSIS — I120 Hypertensive chronic kidney disease with stage 5 chronic kidney disease or end stage renal disease: Secondary | ICD-10-CM | POA: Diagnosis not present

## 2020-03-02 DIAGNOSIS — E7800 Pure hypercholesterolemia, unspecified: Secondary | ICD-10-CM | POA: Diagnosis present

## 2020-03-02 DIAGNOSIS — F329 Major depressive disorder, single episode, unspecified: Secondary | ICD-10-CM | POA: Diagnosis present

## 2020-03-02 DIAGNOSIS — N183 Chronic kidney disease, stage 3 unspecified: Secondary | ICD-10-CM | POA: Diagnosis present

## 2020-03-02 DIAGNOSIS — N189 Chronic kidney disease, unspecified: Secondary | ICD-10-CM

## 2020-03-02 DIAGNOSIS — E875 Hyperkalemia: Secondary | ICD-10-CM

## 2020-03-02 DIAGNOSIS — E1151 Type 2 diabetes mellitus with diabetic peripheral angiopathy without gangrene: Secondary | ICD-10-CM | POA: Diagnosis present

## 2020-03-02 DIAGNOSIS — I10 Essential (primary) hypertension: Secondary | ICD-10-CM

## 2020-03-02 DIAGNOSIS — N401 Enlarged prostate with lower urinary tract symptoms: Secondary | ICD-10-CM

## 2020-03-02 DIAGNOSIS — E2749 Other adrenocortical insufficiency: Secondary | ICD-10-CM

## 2020-03-02 DIAGNOSIS — Z886 Allergy status to analgesic agent status: Secondary | ICD-10-CM

## 2020-03-02 DIAGNOSIS — R809 Proteinuria, unspecified: Secondary | ICD-10-CM | POA: Diagnosis present

## 2020-03-02 DIAGNOSIS — E785 Hyperlipidemia, unspecified: Secondary | ICD-10-CM | POA: Diagnosis present

## 2020-03-02 DIAGNOSIS — D631 Anemia in chronic kidney disease: Secondary | ICD-10-CM | POA: Diagnosis present

## 2020-03-02 DIAGNOSIS — G255 Other chorea: Secondary | ICD-10-CM | POA: Diagnosis present

## 2020-03-02 DIAGNOSIS — K5900 Constipation, unspecified: Secondary | ICD-10-CM | POA: Diagnosis present

## 2020-03-02 DIAGNOSIS — I69398 Other sequelae of cerebral infarction: Secondary | ICD-10-CM

## 2020-03-02 DIAGNOSIS — N281 Cyst of kidney, acquired: Secondary | ICD-10-CM | POA: Diagnosis present

## 2020-03-02 DIAGNOSIS — Z79899 Other long term (current) drug therapy: Secondary | ICD-10-CM

## 2020-03-02 DIAGNOSIS — I129 Hypertensive chronic kidney disease with stage 1 through stage 4 chronic kidney disease, or unspecified chronic kidney disease: Secondary | ICD-10-CM | POA: Diagnosis present

## 2020-03-02 DIAGNOSIS — E1122 Type 2 diabetes mellitus with diabetic chronic kidney disease: Secondary | ICD-10-CM | POA: Diagnosis present

## 2020-03-02 DIAGNOSIS — N400 Enlarged prostate without lower urinary tract symptoms: Secondary | ICD-10-CM | POA: Diagnosis present

## 2020-03-02 DIAGNOSIS — E872 Acidosis, unspecified: Secondary | ICD-10-CM

## 2020-03-02 DIAGNOSIS — Z8249 Family history of ischemic heart disease and other diseases of the circulatory system: Secondary | ICD-10-CM

## 2020-03-02 DIAGNOSIS — Z833 Family history of diabetes mellitus: Secondary | ICD-10-CM

## 2020-03-02 LAB — CBC
HCT: 35.9 % — ABNORMAL LOW (ref 39.0–52.0)
Hematocrit: 34.5 % — ABNORMAL LOW (ref 37.5–51.0)
Hemoglobin: 10.7 g/dL — ABNORMAL LOW (ref 13.0–17.7)
Hemoglobin: 10.7 g/dL — ABNORMAL LOW (ref 13.0–17.0)
MCH: 28.9 pg (ref 26.6–33.0)
MCH: 29.7 pg (ref 26.0–34.0)
MCHC: 31 g/dL — ABNORMAL LOW (ref 31.5–35.7)
MCHC: 29.8 g/dL — ABNORMAL LOW (ref 30.0–36.0)
MCV: 93 fL (ref 79–97)
MCV: 99.7 fL (ref 80.0–100.0)
Platelets: 330 10*3/uL (ref 150–450)
Platelets: 326 10*3/uL (ref 150–400)
RBC: 3.7 x10E6/uL — ABNORMAL LOW (ref 4.14–5.80)
RBC: 3.6 MIL/uL — ABNORMAL LOW (ref 4.22–5.81)
RDW: 13.4 % (ref 11.6–15.4)
RDW: 14.7 % (ref 11.5–15.5)
WBC: 6.6 10*3/uL (ref 3.4–10.8)
WBC: 9.9 10*3/uL (ref 4.0–10.5)
nRBC: 0 % (ref 0.0–0.2)

## 2020-03-02 LAB — BMP8+ANION GAP
Anion Gap: 18 mmol/L (ref 10.0–18.0)
BUN/Creatinine Ratio: 16 (ref 10–24)
BUN: 65 mg/dL — ABNORMAL HIGH (ref 8–27)
CO2: 13 mmol/L — ABNORMAL LOW (ref 20–29)
Calcium: 9 mg/dL (ref 8.6–10.2)
Chloride: 111 mmol/L — ABNORMAL HIGH (ref 96–106)
Creatinine, Ser: 4.17 mg/dL — ABNORMAL HIGH (ref 0.76–1.27)
GFR calc Af Amer: 14 mL/min/{1.73_m2} — ABNORMAL LOW (ref >59)
GFR calc non Af Amer: 13 mL/min/{1.73_m2} — ABNORMAL LOW (ref >59)
Glucose: 143 mg/dL — ABNORMAL HIGH (ref 65–99)
Potassium: 5.6 mmol/L — ABNORMAL HIGH (ref 3.5–5.2)
Sodium: 142 mmol/L (ref 134–144)

## 2020-03-02 LAB — BASIC METABOLIC PANEL
Anion gap: 10 (ref 5–15)
BUN: 68 mg/dL — ABNORMAL HIGH (ref 8–23)
CO2: 17 mmol/L — ABNORMAL LOW (ref 22–32)
Calcium: 8.2 mg/dL — ABNORMAL LOW (ref 8.9–10.3)
Chloride: 114 mmol/L — ABNORMAL HIGH (ref 98–111)
Creatinine, Ser: 4.15 mg/dL — ABNORMAL HIGH (ref 0.61–1.24)
GFR calc Af Amer: 15 mL/min — ABNORMAL LOW (ref >60)
GFR calc non Af Amer: 13 mL/min — ABNORMAL LOW (ref >60)
Glucose, Bld: 149 mg/dL — ABNORMAL HIGH (ref 70–99)
Potassium: 5.8 mmol/L — ABNORMAL HIGH (ref 3.5–5.1)
Sodium: 141 mmol/L (ref 135–145)

## 2020-03-02 LAB — URINALYSIS, ROUTINE W REFLEX MICROSCOPIC
Bacteria, UA: NONE SEEN
Bilirubin Urine: NEGATIVE
Glucose, UA: NEGATIVE mg/dL
Hgb urine dipstick: NEGATIVE
Ketones, ur: NEGATIVE mg/dL
Leukocytes,Ua: NEGATIVE
Nitrite: NEGATIVE
Protein, ur: >=300 mg/dL — AB
Specific Gravity, Urine: 1.014 (ref 1.005–1.030)
pH: 5 (ref 5.0–8.0)

## 2020-03-02 MED ORDER — SODIUM CHLORIDE 0.9% FLUSH: 3.0000 mL | Freq: Once | INTRAVENOUS | Status: DC

## 2020-03-02 NOTE — ED Triage Notes (Signed)
Pt sent here by his PCP for worsening kidney function labs and hyperkalemia. Pt says he has been very fatigued. Taking his medication as prescribed.

## 2020-03-02 NOTE — Telephone Encounter (Signed)
I called Derrick Hendrix to discuss his labs. It shows an acute worsening of his renal function and hyperkalemia. He states that he is still making urine but does endorse polyuria. He denies chest pain, shortness of breath or palpitation.   I strongly encouraged him to call 911 and report to the emergency department.

## 2020-03-03 ENCOUNTER — Emergency Department (HOSPITAL_COMMUNITY): Payer: Medicare Other

## 2020-03-03 ENCOUNTER — Other Ambulatory Visit: Payer: Self-pay

## 2020-03-03 DIAGNOSIS — E1122 Type 2 diabetes mellitus with diabetic chronic kidney disease: Secondary | ICD-10-CM

## 2020-03-03 DIAGNOSIS — Z7901 Long term (current) use of anticoagulants: Secondary | ICD-10-CM

## 2020-03-03 DIAGNOSIS — E1151 Type 2 diabetes mellitus with diabetic peripheral angiopathy without gangrene: Secondary | ICD-10-CM

## 2020-03-03 DIAGNOSIS — N179 Acute kidney failure, unspecified: Secondary | ICD-10-CM | POA: Diagnosis present

## 2020-03-03 DIAGNOSIS — Z87891 Personal history of nicotine dependence: Secondary | ICD-10-CM | POA: Diagnosis not present

## 2020-03-03 DIAGNOSIS — Z886 Allergy status to analgesic agent status: Secondary | ICD-10-CM | POA: Diagnosis not present

## 2020-03-03 DIAGNOSIS — I4891 Unspecified atrial fibrillation: Secondary | ICD-10-CM

## 2020-03-03 DIAGNOSIS — D649 Anemia, unspecified: Secondary | ICD-10-CM | POA: Diagnosis not present

## 2020-03-03 DIAGNOSIS — D631 Anemia in chronic kidney disease: Secondary | ICD-10-CM | POA: Diagnosis present

## 2020-03-03 DIAGNOSIS — L602 Onychogryphosis: Secondary | ICD-10-CM

## 2020-03-03 DIAGNOSIS — E872 Acidosis, unspecified: Secondary | ICD-10-CM

## 2020-03-03 DIAGNOSIS — N138 Other obstructive and reflux uropathy: Secondary | ICD-10-CM

## 2020-03-03 DIAGNOSIS — E8809 Other disorders of plasma-protein metabolism, not elsewhere classified: Secondary | ICD-10-CM

## 2020-03-03 DIAGNOSIS — R5383 Other fatigue: Secondary | ICD-10-CM | POA: Diagnosis not present

## 2020-03-03 DIAGNOSIS — E875 Hyperkalemia: Secondary | ICD-10-CM | POA: Diagnosis present

## 2020-03-03 DIAGNOSIS — R809 Proteinuria, unspecified: Secondary | ICD-10-CM

## 2020-03-03 DIAGNOSIS — G255 Other chorea: Secondary | ICD-10-CM | POA: Diagnosis present

## 2020-03-03 DIAGNOSIS — E1129 Type 2 diabetes mellitus with other diabetic kidney complication: Secondary | ICD-10-CM | POA: Diagnosis not present

## 2020-03-03 DIAGNOSIS — F329 Major depressive disorder, single episode, unspecified: Secondary | ICD-10-CM | POA: Diagnosis present

## 2020-03-03 DIAGNOSIS — N183 Chronic kidney disease, stage 3 unspecified: Secondary | ICD-10-CM | POA: Diagnosis present

## 2020-03-03 DIAGNOSIS — E7800 Pure hypercholesterolemia, unspecified: Secondary | ICD-10-CM | POA: Diagnosis present

## 2020-03-03 DIAGNOSIS — Z833 Family history of diabetes mellitus: Secondary | ICD-10-CM | POA: Diagnosis not present

## 2020-03-03 DIAGNOSIS — Z79899 Other long term (current) drug therapy: Secondary | ICD-10-CM

## 2020-03-03 DIAGNOSIS — E785 Hyperlipidemia, unspecified: Secondary | ICD-10-CM | POA: Diagnosis present

## 2020-03-03 DIAGNOSIS — K5900 Constipation, unspecified: Secondary | ICD-10-CM | POA: Diagnosis present

## 2020-03-03 DIAGNOSIS — N281 Cyst of kidney, acquired: Secondary | ICD-10-CM | POA: Diagnosis present

## 2020-03-03 DIAGNOSIS — I129 Hypertensive chronic kidney disease with stage 1 through stage 4 chronic kidney disease, or unspecified chronic kidney disease: Secondary | ICD-10-CM | POA: Diagnosis present

## 2020-03-03 DIAGNOSIS — N400 Enlarged prostate without lower urinary tract symptoms: Secondary | ICD-10-CM | POA: Diagnosis present

## 2020-03-03 DIAGNOSIS — I69398 Other sequelae of cerebral infarction: Secondary | ICD-10-CM | POA: Diagnosis not present

## 2020-03-03 DIAGNOSIS — Z20822 Contact with and (suspected) exposure to covid-19: Secondary | ICD-10-CM | POA: Diagnosis present

## 2020-03-03 DIAGNOSIS — N189 Chronic kidney disease, unspecified: Secondary | ICD-10-CM | POA: Diagnosis present

## 2020-03-03 DIAGNOSIS — N401 Enlarged prostate with lower urinary tract symptoms: Secondary | ICD-10-CM

## 2020-03-03 DIAGNOSIS — I69354 Hemiplegia and hemiparesis following cerebral infarction affecting left non-dominant side: Secondary | ICD-10-CM

## 2020-03-03 DIAGNOSIS — E2740 Unspecified adrenocortical insufficiency: Secondary | ICD-10-CM | POA: Diagnosis present

## 2020-03-03 DIAGNOSIS — Z8249 Family history of ischemic heart disease and other diseases of the circulatory system: Secondary | ICD-10-CM | POA: Diagnosis not present

## 2020-03-03 DIAGNOSIS — I480 Paroxysmal atrial fibrillation: Secondary | ICD-10-CM | POA: Diagnosis present

## 2020-03-03 LAB — BASIC METABOLIC PANEL
Anion gap: 8 (ref 5–15)
BUN: 60 mg/dL — ABNORMAL HIGH (ref 8–23)
CO2: 19 mmol/L — ABNORMAL LOW (ref 22–32)
Calcium: 8.1 mg/dL — ABNORMAL LOW (ref 8.9–10.3)
Chloride: 118 mmol/L — ABNORMAL HIGH (ref 98–111)
Creatinine, Ser: 3.58 mg/dL — ABNORMAL HIGH (ref 0.61–1.24)
GFR calc Af Amer: 17 mL/min — ABNORMAL LOW (ref >60)
GFR calc non Af Amer: 15 mL/min — ABNORMAL LOW (ref >60)
Glucose, Bld: 90 mg/dL (ref 70–99)
Potassium: 5 mmol/L (ref 3.5–5.1)
Sodium: 145 mmol/L (ref 135–145)

## 2020-03-03 LAB — CBG MONITORING, ED
Glucose-Capillary: 87 mg/dL (ref 70–99)
Glucose-Capillary: 66 mg/dL — ABNORMAL LOW (ref 70–99)
Glucose-Capillary: 152 mg/dL — ABNORMAL HIGH (ref 70–99)

## 2020-03-03 LAB — RENAL FUNCTION PANEL
Albumin: 3.3 g/dL — ABNORMAL LOW (ref 3.5–5.0)
Anion gap: 11 (ref 5–15)
BUN: 64 mg/dL — ABNORMAL HIGH (ref 8–23)
CO2: 17 mmol/L — ABNORMAL LOW (ref 22–32)
Calcium: 8.2 mg/dL — ABNORMAL LOW (ref 8.9–10.3)
Chloride: 116 mmol/L — ABNORMAL HIGH (ref 98–111)
Creatinine, Ser: 3.89 mg/dL — ABNORMAL HIGH (ref 0.61–1.24)
GFR calc Af Amer: 16 mL/min — ABNORMAL LOW (ref >60)
GFR calc non Af Amer: 14 mL/min — ABNORMAL LOW (ref >60)
Glucose, Bld: 64 mg/dL — ABNORMAL LOW (ref 70–99)
Phosphorus: 5.3 mg/dL — ABNORMAL HIGH (ref 2.5–4.6)
Potassium: 4.3 mmol/L (ref 3.5–5.1)
Sodium: 144 mmol/L (ref 135–145)

## 2020-03-03 LAB — SARS CORONAVIRUS 2 (TAT 6-24 HRS): SARS Coronavirus 2: NEGATIVE

## 2020-03-03 LAB — CBC
HCT: 32.4 % — ABNORMAL LOW (ref 39.0–52.0)
Hemoglobin: 9.8 g/dL — ABNORMAL LOW (ref 13.0–17.0)
MCH: 29.5 pg (ref 26.0–34.0)
MCHC: 30.2 g/dL (ref 30.0–36.0)
MCV: 97.6 fL (ref 80.0–100.0)
Platelets: 278 10*3/uL (ref 150–400)
RBC: 3.32 MIL/uL — ABNORMAL LOW (ref 4.22–5.81)
RDW: 14.8 % (ref 11.5–15.5)
WBC: 8.1 10*3/uL (ref 4.0–10.5)
nRBC: 0 % (ref 0.0–0.2)

## 2020-03-03 LAB — CREATININE, URINE, RANDOM: Creatinine, Urine: 197.37 mg/dL

## 2020-03-03 LAB — SODIUM, URINE, RANDOM: Sodium, Ur: 53 mmol/L

## 2020-03-03 LAB — IRON AND TIBC
Iron: 51 ug/dL (ref 45–182)
Saturation Ratios: 20 % (ref 17.9–39.5)
TIBC: 256 ug/dL (ref 250–450)
UIBC: 205 ug/dL

## 2020-03-03 LAB — FERRITIN: Ferritin: 250 ng/mL (ref 24–336)

## 2020-03-03 LAB — GLUCOSE, CAPILLARY
Glucose-Capillary: 144 mg/dL — ABNORMAL HIGH (ref 70–99)
Glucose-Capillary: 143 mg/dL — ABNORMAL HIGH (ref 70–99)
Glucose-Capillary: 135 mg/dL — ABNORMAL HIGH (ref 70–99)

## 2020-03-03 LAB — PROTEIN / CREATININE RATIO, URINE
Creatinine, Urine: 165.92 mg/dL
Protein Creatinine Ratio: 2.16 mg/mg{Cre} — ABNORMAL HIGH (ref 0.00–0.15)
Total Protein, Urine: 359 mg/dL

## 2020-03-03 LAB — CK: Total CK: 146 U/L (ref 49–397)

## 2020-03-03 MED ORDER — APIXABAN 2.5 MG PO TABS
2.5000 mg | ORAL_TABLET | Freq: Two times a day (BID) | ORAL | Status: DC
  Administered 2020-03-03 – 2020-03-04 (×3): 2.5 mg via ORAL
  Filled 2020-03-03 (×4): qty 1

## 2020-03-03 MED ORDER — ALBUTEROL SULFATE (2.5 MG/3ML) 0.083% IN NEBU: 10.0000 mg | INHALATION_SOLUTION | Freq: Once | RESPIRATORY_TRACT | Status: DC

## 2020-03-03 MED ORDER — HYDRALAZINE HCL 10 MG PO TABS
10.0000 mg | ORAL_TABLET | Freq: Three times a day (TID) | ORAL | Status: DC
  Administered 2020-03-03 – 2020-03-04 (×3): 10 mg via ORAL
  Filled 2020-03-03 (×3): qty 1

## 2020-03-03 MED ORDER — DOCUSATE SODIUM 100 MG PO CAPS
100.0000 mg | ORAL_CAPSULE | Freq: Every day | ORAL | Status: DC
  Administered 2020-03-03 – 2020-03-04 (×2): 100 mg via ORAL
  Filled 2020-03-03 (×2): qty 1

## 2020-03-03 MED ORDER — FLUDROCORTISONE ACETATE 0.1 MG PO TABS
100.0000 ug | ORAL_TABLET | Freq: Two times a day (BID) | ORAL | Status: DC
  Administered 2020-03-03 – 2020-03-04 (×3): 100 ug via ORAL
  Filled 2020-03-03 (×5): qty 1

## 2020-03-03 MED ORDER — IPRATROPIUM BROMIDE 0.06 % NA SOLN
2.0000 | Freq: Three times a day (TID) | NASAL | Status: DC
  Administered 2020-03-03 – 2020-03-04 (×4): 2 via NASAL
  Filled 2020-03-03: qty 15

## 2020-03-03 MED ORDER — INSULIN ASPART 100 UNIT/ML IV SOLN
10.0000 [IU] | Freq: Once | INTRAVENOUS | Status: AC
  Administered 2020-03-03: 10 [IU] via INTRAVENOUS

## 2020-03-03 MED ORDER — DEXTROSE 50 % IV SOLN
1.0000 | Freq: Once | INTRAVENOUS | Status: AC
  Administered 2020-03-03: 50 mL via INTRAVENOUS
  Filled 2020-03-03: qty 50

## 2020-03-03 MED ORDER — CLONAZEPAM 0.25 MG PO TBDP
0.2500 mg | ORAL_TABLET | Freq: Three times a day (TID) | ORAL | Status: DC
  Administered 2020-03-03 – 2020-03-04 (×4): 0.25 mg via ORAL
  Filled 2020-03-03 (×4): qty 1

## 2020-03-03 MED ORDER — INSULIN ASPART 100 UNIT/ML ~~LOC~~ SOLN
0.0000 [IU] | Freq: Three times a day (TID) | SUBCUTANEOUS | Status: DC
  Administered 2020-03-03 (×2): 2 [IU] via SUBCUTANEOUS

## 2020-03-03 MED ORDER — ACETAMINOPHEN 650 MG RE SUPP: 650.0000 mg | Freq: Four times a day (QID) | RECTAL | Status: DC | PRN

## 2020-03-03 MED ORDER — ACETAMINOPHEN 325 MG PO TABS: 650.0000 mg | ORAL_TABLET | Freq: Four times a day (QID) | ORAL | Status: DC | PRN

## 2020-03-03 MED ORDER — ATORVASTATIN CALCIUM 80 MG PO TABS
80.0000 mg | ORAL_TABLET | Freq: Every day | ORAL | Status: DC
  Administered 2020-03-03 – 2020-03-04 (×2): 80 mg via ORAL
  Filled 2020-03-03 (×2): qty 1

## 2020-03-03 MED ORDER — FINASTERIDE 5 MG PO TABS
5.0000 mg | ORAL_TABLET | Freq: Every day | ORAL | Status: DC
  Administered 2020-03-03 – 2020-03-04 (×2): 5 mg via ORAL
  Filled 2020-03-03 (×2): qty 1

## 2020-03-03 MED ORDER — PROMETHAZINE HCL 25 MG PO TABS: 12.5000 mg | ORAL_TABLET | Freq: Four times a day (QID) | ORAL | Status: DC | PRN

## 2020-03-03 MED ORDER — TAMSULOSIN HCL 0.4 MG PO CAPS
0.4000 mg | ORAL_CAPSULE | Freq: Every day | ORAL | Status: DC
  Administered 2020-03-03: 0.4 mg via ORAL
  Filled 2020-03-03: qty 1

## 2020-03-03 MED ORDER — SODIUM ZIRCONIUM CYCLOSILICATE 10 G PO PACK
10.0000 g | PACK | Freq: Once | ORAL | Status: AC
  Administered 2020-03-03: 10 g via ORAL
  Filled 2020-03-03: qty 1

## 2020-03-03 MED ORDER — SENNOSIDES-DOCUSATE SODIUM 8.6-50 MG PO TABS: 1.0000 | ORAL_TABLET | Freq: Every evening | ORAL | Status: DC | PRN

## 2020-03-03 MED ORDER — AMLODIPINE BESYLATE 10 MG PO TABS
10.0000 mg | ORAL_TABLET | Freq: Every day | ORAL | Status: DC
  Administered 2020-03-03 – 2020-03-04 (×2): 10 mg via ORAL
  Filled 2020-03-03 (×2): qty 1

## 2020-03-03 MED ORDER — LACTATED RINGERS IV SOLN: INTRAVENOUS | Status: AC

## 2020-03-03 MED ORDER — ALBUTEROL SULFATE (2.5 MG/3ML) 0.083% IN NEBU: 2.5000 mg | INHALATION_SOLUTION | RESPIRATORY_TRACT | Status: AC

## 2020-03-03 MED ORDER — LACTATED RINGERS IV SOLN: INTRAVENOUS | Status: DC

## 2020-03-03 MED ORDER — SODIUM CHLORIDE 0.9 % IV SOLN: Freq: Once | INTRAVENOUS | Status: DC

## 2020-03-03 NOTE — ED Notes (Signed)
Please called niece if the pt will be discharged or admitted

## 2020-03-03 NOTE — Progress Notes (Addendum)
   NAME:  Derrick Hendrix, MRN:  [id number], DOB:  1941-04-15, LOS: 0 ADMISSION DATE:  03/02/2020  Subjective  No overnight events. No complaints this morning. Discussed plan for continued IVF and renal function monitoring.  Objective   Blood pressure (!) 159/97, pulse (!) 115, temperature 98.7 F (37.1 C), temperature source Oral, resp. rate 18, height 6\' 2"  (1.88 m), weight 108.9 kg, SpO2 98 %.     Intake/Output Summary (Last 24 hours) at 03/03/2020 1018 Last data filed at 03/03/2020 0850 Gross per 24 hour  Intake --  Output 400 ml  Net -400 ml   Filed Weights   03/03/20 0207 03/03/20 0926  Weight: 108.9 kg 108.9 kg    Examination: GENERAL: chronically ill appearing CARDIAC: heart RRR. No peripheral edema.  PULMONARY: acyanotic. Lung sounds clear to auscultation. ABDOMEN: nondistended. bs active. Non-tender SKIN: no rash or lesions on limited exam  Significant Diagnostic Tests:  3/20 renal US> no acute findings.    Labs    CBC Latest Ref Rng & Units 03/03/2020 03/02/2020 03/01/2020  WBC 4.0 - 10.5 K/uL 8.1 9.9 6.6  Hemoglobin 13.0 - 17.0 g/dL 9.8(L) 10.7(L) 10.7(L)  Hematocrit 39.0 - 52.0 % 32.4(L) 35.9(L) 34.5(L)  Platelets 150 - 400 K/uL 278 326 330   BMP Latest Ref Rng & Units 03/03/2020 03/02/2020 03/01/2020  Glucose 70 - 99 mg/dL 64(L) 149(H) 143(H)  BUN 8 - 23 mg/dL 64(H) 68(H) 65(H)  Creatinine 0.61 - 1.24 mg/dL 3.89(H) 4.15(H) 4.17(H)  BUN/Creat Ratio 10 - 24 - - 16  Sodium 135 - 145 mmol/L 144 141 142  Potassium 3.5 - 5.1 mmol/L 4.3 5.8(H) 5.6(H)  Chloride 98 - 111 mmol/L 116(H) 114(H) 111(H)  CO2 22 - 32 mmol/L 17(L) 17(L) 13(L)  Calcium 8.9 - 10.3 mg/dL 8.2(L) 8.2(L) 9.0    Summary  Derrick Hendrix is an 79 yo gentleman with a PMH of CKD III, hyporeninemic hypoaldosteronism, BPH with obstruction, T2DM, atrial fibrillation, and PAD who was admitted to IMTS for acute on chronic renal failure.  Assessment & Plan:  Active Problems:   Acute on chronic renal  failure (HCC)  AKI on CKD III with proteinuria. BPH. Baseline GFR ~30 and creatinine ~2.0. GFR 13 on admission, creatinine 4. Suspect chronic component 2/2 hypertension/DM.  [id number] suggests pre-renal etiology however this is most likely a mixed picture with his BPH. He notes that he does drink about 7-8 bottles of water a day and has been having normal UOP UA not suggestive of infectious source and no hydronephrosis to suggest obstruction on renal US. Plan Continue flomax and finasteride Continue LR @ 139mL/hr q4h bladder scans  Essential hypertension. Blood pressures in the 150s/90s this morning so improved from last night. Continue amlodipine Hyporeninemic hypoaldosteronism makes this somewhat of a mixed picture as would expect this to make him hypotensive. Will continue the florinef and continue to monitor blood pressures. Atrial fibrillation. SR on admission. Continue eliquis.  T2DM. Continue SSI. Will need to monitor sugars closely given his hypoglycemic events overnight.  Normocytic anemia. Appears to have been slowly trending down since 2018 at least. Likely ACD but will obtain iron panel to confirm. May need iron infusions if concerned about absorptive issues.  Best practice:  CODE STATUS: full Diet: renal DVT for prophylaxis: eliquis Dispo: pending further eval and management of acute on chronic renal failure   Derrick Hansen, MD [place] [facility] PGY-1 PAGER #: [phone] 03/03/20  10:18 AM

## 2020-03-03 NOTE — ED Notes (Signed)
Ordered Renal Carb w/Fluid Restriction Breakfast Tray

## 2020-03-03 NOTE — Progress Notes (Signed)
   Vital Signs MEWS/VS Documentation      03/03/2020 0700 03/03/2020 0715 03/03/2020 0730 03/03/2020 0926   MEWS Score:  0  0  0  2   MEWS Score Color:  Green  Green  Green  Yellow   Resp:  17  16  18  18    Pulse:  96  86  81  (!) 115   BP:  (!) 154/87  (!) 149/91  (!) 152/83  (!) 159/97   Temp:  --  --  --  98.7 F (37.1 C)   O2 Device:  --  --  --  Room Air   Level of Consciousness:  --  --  --  Alert        Patient is a new admission. Has not received any home medications. Will administer antihypertension medications and reassess patient.    Altus Zaino 03/03/2020,9:29 AM

## 2020-03-03 NOTE — ED Notes (Signed)
Report given to Andrew RN

## 2020-03-03 NOTE — ED Notes (Signed)
This RN gave patient 120 ml of orange juice d/t to CBG 66; Will monitor and recheck CBG in 1 hour.

## 2020-03-03 NOTE — ED Notes (Signed)
ED TO INPATIENT HANDOFF REPORT  ED Nurse Name and Phone #:  Percell Locus, RN  S Name/Age/Gender Derrick Hendrix 79 y.o. male Room/Bed: 009C/009C  Code Status   Code Status: Full Code  Home/SNF/Other Home Patient oriented to: self, place, time and situation Is this baseline? Yes   Triage Complete: Triage complete  Chief Complaint Acute on chronic renal failure ([place]) [N17.9, N18.9]  Triage Note Pt sent here by his PCP for worsening kidney function labs and hyperkalemia. Pt says he has been very fatigued. Taking his medication as prescribed.     Allergies Allergies  Allergen Reactions  . Aspirin Other (See Comments)    Nosebleeds     Level of Care/Admitting Diagnosis ED Disposition    ED Disposition Condition Comment   Admit  Hospital Area: [facility] [[id number]]  Level of Care: Telemetry Medical [104]  Covid Evaluation: Asymptomatic Screening Protocol (No Symptoms)  Diagnosis: Acute on chronic renal failure [facility]) [[id number]]  Admitting Physician: Cresenciano Lick  Attending Physician: Cresenciano Lick       B Medical/Surgery History Past Medical History:  Diagnosis Date  . Abscess of foot without toes, left 08/28/2017  . Chronic kidney disease (CKD), stage III (moderate)   . Constipation 11/07/2014  . Daily headache    "lately" (04/17/2016)  . Depression   . Dizziness 03/07/2016  . Headache 06/26/2014  . Heart murmur   . High cholesterol   . Hyperkalemia 08/2014  . Hyperkalemia 10/20/2014  . Hyperkalemia 10/05/2018  . Hyperlipemia 11/11/2016  . Hypertension   . Neuropathic pain 02/06/2015  . Osteomyelitis ([place])   . PAD (peripheral artery disease) ([place])   . Paronychia of great toe, right   . Preop cardiovascular exam   . Right foot pain 02/09/2017  . Stable angina ([place]) 06/27/2014  . Stroke ([place]) 03/2016   hx of PAD; j"ust lots of headaches since" (04/17/2016)  . Vitamin B12 deficiency 06/27/2014  . Weight loss, unintentional 06/27/2014    Past Surgical History:  Procedure Laterality Date  . ABDOMINAL AORTOGRAM W/LOWER EXTREMITY N/A 03/02/2017   Procedure: Abdominal Aortogram w/Lower Extremity;  Surgeon: Conrad Loganville, MD;  Location: [facility];  Service: Cardiovascular;  Laterality: N/A;  . EP IMPLANTABLE DEVICE N/A 03/04/2016   Procedure: Loop Recorder Insertion;  Surgeon: Thompson Grayer, MD;  Location: [facility];  Service: Cardiovascular;  Laterality: N/A;  . PERIPHERAL VASCULAR INTERVENTION Right 03/06/2017   Procedure: Peripheral Vascular Intervention;  Surgeon: Elam Dutch, MD;  Location: [facility];  Service: Cardiovascular;  Laterality: Right;  SFA     A IV Location/Drains/Wounds Patient Lines/Drains/Airways Status   Active Line/Drains/Airways    Name:   Placement date:   Placement time:   Site:   Days:   Peripheral IV 03/03/20 Left Hand   03/03/20    0231    Hand   less than 1   Incision (Closed) 03/05/16 Chest Left   03/05/16    0200     1459   Wound / Incision (Open or Dehisced) 03/03/17  Toe (Comment  which one) Right  rt toe nail bed wound s/p toe nail removal   03/03/17    0800    Toe (Comment  which one)   1096          Intake/Output Last 24 hours No intake or output data in the 24 hours ending 03/03/20 7628  Labs/Imaging Results for orders placed or performed during the hospital  encounter of 03/02/20 (from the past 48 hour(s))  Protein / creatinine ratio, urine     Status: Abnormal   Collection Time: 03/02/20  2:14 AM  Result Value Ref Range   Creatinine, Urine 165.92 mg/dL   Total Protein, Urine 359 mg/dL    Comment: NO NORMAL RANGE ESTABLISHED FOR THIS TEST   Protein Creatinine Ratio 2.16 (H) 0.00 - 0.15 mg/mg[Cre]    Comment: Performed at [facility] [address], [place], [place] [address]  Basic metabolic panel     Status: Abnormal   Collection Time: 03/02/20  9:09 PM  Result Value Ref Range   Sodium 141 135 - 145 mmol/L   Potassium 5.8 (H) 3.5 - 5.1  mmol/L   Chloride 114 (H) 98 - 111 mmol/L   CO2 17 (L) 22 - 32 mmol/L   Glucose, Bld 149 (H) 70 - 99 mg/dL    Comment: Glucose reference range applies only to samples taken after fasting for at least 8 hours.   BUN 68 (H) 8 - 23 mg/dL   Creatinine, Ser 4.15 (H) 0.61 - 1.24 mg/dL   Calcium 8.2 (L) 8.9 - 10.3 mg/dL   GFR calc non Af Amer 13 (L) >60 mL/min   GFR calc Af Amer 15 (L) >60 mL/min   Anion gap 10 5 - 15    Comment: Performed at [facility] [address] [place], [place] [address]  CBC     Status: Abnormal   Collection Time: 03/02/20  9:09 PM  Result Value Ref Range   WBC 9.9 4.0 - 10.5 K/uL   RBC 3.60 (L) 4.22 - 5.81 MIL/uL   Hemoglobin 10.7 (L) 13.0 - 17.0 g/dL   HCT 35.9 (L) 39.0 - 52.0 %   MCV 99.7 80.0 - 100.0 fL   MCH 29.7 26.0 - 34.0 pg   MCHC 29.8 (L) 30.0 - 36.0 g/dL   RDW 14.7 11.5 - 15.5 %   Platelets 326 150 - 400 K/uL   nRBC 0.0 0.0 - 0.2 %    Comment: Performed at [facility], [place] [address] [place], [place] [address]  Urinalysis, Routine w reflex microscopic     Status: Abnormal   Collection Time: 03/02/20 11:25 PM  Result Value Ref Range   Color, Urine YELLOW YELLOW   APPearance CLEAR CLEAR   Specific Gravity, Urine 1.014 1.005 - 1.030   pH 5.0 5.0 - 8.0   Glucose, UA NEGATIVE NEGATIVE mg/dL   Hgb urine dipstick NEGATIVE NEGATIVE   Bilirubin Urine NEGATIVE NEGATIVE   Ketones, ur NEGATIVE NEGATIVE mg/dL   Protein, ur >=300 (A) NEGATIVE mg/dL   Nitrite NEGATIVE NEGATIVE   Leukocytes,Ua NEGATIVE NEGATIVE   RBC / HPF 0-5 0 - 5 RBC/hpf   WBC, UA 0-5 0 - 5 WBC/hpf   Bacteria, UA NONE SEEN NONE SEEN   Mucus PRESENT    Hyaline Casts, UA PRESENT     Comment: Performed at [facility] [address] [place], [place] [address]  Sodium, urine, random     Status: None   Collection Time: 03/03/20  2:33 AM  Result Value Ref Range   Sodium, Ur 53 mmol/L    Comment: Performed at [facility] [address], [place], [place]  [address]  Creatinine, urine, random     Status: None   Collection Time: 03/03/20  2:33 AM  Result Value Ref Range   Creatinine, Urine 197.37 mg/dL  Comment: Performed at [facility], [place] [address], [place], [place] [address]  CBG monitoring, ED     Status: None   Collection Time: 03/03/20  2:56 AM  Result Value Ref Range   Glucose-Capillary 87 70 - 99 mg/dL    Comment: Glucose reference range applies only to samples taken after fasting for at least 8 hours.  CK     Status: None   Collection Time: 03/03/20  4:55 AM  Result Value Ref Range   Total CK 146 49 - 397 U/L    Comment: Performed at [facility], [place] [address] [place], [place] [address]  Renal function panel     Status: Abnormal   Collection Time: 03/03/20  4:55 AM  Result Value Ref Range   Sodium 144 135 - 145 mmol/L   Potassium 4.3 3.5 - 5.1 mmol/L   Chloride 116 (H) 98 - 111 mmol/L   CO2 17 (L) 22 - 32 mmol/L   Glucose, Bld 64 (L) 70 - 99 mg/dL    Comment: Glucose reference range applies only to samples taken after fasting for at least 8 hours.   BUN 64 (H) 8 - 23 mg/dL   Creatinine, Ser 3.89 (H) 0.61 - 1.24 mg/dL   Calcium 8.2 (L) 8.9 - 10.3 mg/dL   Phosphorus 5.3 (H) 2.5 - 4.6 mg/dL   Albumin 3.3 (L) 3.5 - 5.0 g/dL   GFR calc non Af Amer 14 (L) >60 mL/min   GFR calc Af Amer 16 (L) >60 mL/min   Anion gap 11 5 - 15    Comment: Performed at [facility] [address] [place], [place] [address]  CBC     Status: Abnormal   Collection Time: 03/03/20  4:55 AM  Result Value Ref Range   WBC 8.1 4.0 - 10.5 K/uL   RBC 3.32 (L) 4.22 - 5.81 MIL/uL   Hemoglobin 9.8 (L) 13.0 - 17.0 g/dL   HCT 32.4 (L) 39.0 - 52.0 %   MCV 97.6 80.0 - 100.0 fL   MCH 29.5 26.0 - 34.0 pg   MCHC 30.2 30.0 - 36.0 g/dL   RDW 14.8 11.5 - 15.5 %   Platelets 278 150 - 400 K/uL   nRBC 0.0 0.0 - 0.2 %    Comment: Performed at [facility], [place] [address], [place], [place] [address]  CBG monitoring, ED     Status:  Abnormal   Collection Time: 03/03/20  7:50 AM  Result Value Ref Range   Glucose-Capillary 66 (L) 70 - 99 mg/dL    Comment: Glucose reference range applies only to samples taken after fasting for at least 8 hours.   Comment 1 Notify RN    Comment 2 Document in Chart    US RENAL  Result Date: 03/03/2020 CLINICAL DATA:  Acute kidney injury, elevated BUN and creatinine. Chronic kidney disease. EXAM: RENAL / URINARY TRACT ULTRASOUND COMPLETE COMPARISON:  10/05/2018 FINDINGS: Right Kidney: Renal measurements: 11.7 x 5.0 x 5.0 cm = volume: 152 mL. Multiple right cysts measuring up to 2.9 cm in the lower pole. Diffusely increased echotexture. No hydronephrosis. Left Kidney: Renal measurements: 9.6 x 5.1 x 4.5 cm = volume: 115 mL. Multiple cysts, the largest 2.8 cm in the upper pole. Increased echotexture. No hydronephrosis. Bladder: Appears normal for degree of bladder distention. Other: None. IMPRESSION: Multiple bilateral renal cysts. No acute findings.  No hydronephrosis. Increased echotexture compatible with chronic medical renal disease. Electronically Signed  By: Rolm Baptise M.D.   On: 03/03/2020 03:45    Pending Labs Unresulted Labs (From admission, onward)    Start     Ordered   03/03/20 0225  SARS CORONAVIRUS 2 (TAT 6-24 HRS) Nasopharyngeal Nasopharyngeal Swab  (Tier 3 (TAT 6-24 hrs))  Once,   STAT    Question Answer Comment  Is this test for diagnosis or screening Screening   Symptomatic for COVID-19 as defined by CDC No   Hospitalized for COVID-19 No   Admitted to ICU for COVID-19 No   Previously tested for COVID-19 No   Resident in a congregate (group) care setting Unknown   Employed in healthcare setting No      03/03/20 0224          Vitals/Pain Today's Vitals   03/03/20 0545 03/03/20 0630 03/03/20 0633 03/03/20 0700  BP:   (!) 147/81 (!) 154/87  Pulse: 100 79 95 96  Resp: 15 14 18 17   Temp:      TempSrc:      SpO2: 98% 98% 99% 98%  Weight:      Height:      PainSc:         Isolation Precautions No active isolations  Medications Medications  sodium chloride flush (NS) 0.9 % injection 3 mL (3 mLs Intravenous Not Given 03/03/20 0342)  0.9 %  sodium chloride infusion ( Intravenous Not Given 03/03/20 0340)  albuterol (PROVENTIL) (2.5 MG/3ML) 0.083% nebulizer solution 2.5 mg (2.5 mg Nebulization Not Given 03/03/20 0326)  atorvastatin (LIPITOR) tablet 80 mg (has no administration in time range)  clonazePAM (KLONOPIN) disintegrating tablet 0.25 mg (has no administration in time range)  docusate sodium (COLACE) capsule 100 mg (has no administration in time range)  finasteride (PROSCAR) tablet 5 mg (has no administration in time range)  fludrocortisone (FLORINEF) tablet 100 mcg (has no administration in time range)  ipratropium (ATROVENT) 0.06 % nasal spray 2 spray (has no administration in time range)  tamsulosin (FLOMAX) capsule 0.4 mg (has no administration in time range)  apixaban (ELIQUIS) tablet 2.5 mg (has no administration in time range)  amLODipine (NORVASC) tablet 10 mg (has no administration in time range)  lactated ringers infusion ( Intravenous New Bag/Given 03/03/20 0316)  acetaminophen (TYLENOL) tablet 650 mg (has no administration in time range)    Or  acetaminophen (TYLENOL) suppository 650 mg (has no administration in time range)  senna-docusate (Senokot-S) tablet 1 tablet (has no administration in time range)  promethazine (PHENERGAN) tablet 12.5 mg (has no administration in time range)  insulin aspart (novoLOG) injection 0-15 Units (0 Units Subcutaneous Not Given 03/03/20 0806)  insulin aspart (novoLOG) injection 10 Units (10 Units Intravenous Given 03/03/20 0315)    And  dextrose 50 % solution 50 mL (50 mLs Intravenous Given 03/03/20 0315)  sodium zirconium cyclosilicate (LOKELMA) packet 10 g (10 g Oral Given 03/03/20 0346)    Mobility walks with device High fall risk   R Recommendations: See Admitting Provider Note  Report given to:    Additional Notes:

## 2020-03-03 NOTE — ED Notes (Signed)
Pt to US.

## 2020-03-03 NOTE — Evaluation (Signed)
Physical Therapy Evaluation Patient Details Name: Derrick Hendrix MRN: [id number] DOB: Dec 18, 1940 Today's Date: 03/03/2020   History of Present Illness  Pt is an 79 y.o. male who was admitted 03/02/20 with acute worsening of his renal function and hyperkalemia. PMH includes CKD, HLD, HTN, PAD, DM2, Afib, CVA (with h/o involuntary L UE movement).    Clinical Impression  Pt presents with an overall decrease in functional mobility secondary to above. PTA, pt mod indep with cane and lives alone; has friends who drive and assist as needed. Today, pt moving well with RW at supervision-level; reports feeling more stable with RW and would like to d/c home with one. Pt would benefit from continued acute PT services to maximize functional mobility and independence prior to d/c with HHPT services.     Follow Up Recommendations Home health PT;Supervision - Intermittent    Equipment Recommendations  Rolling walker with 5" wheels    Recommendations for Other Services OT consult     Precautions / Restrictions Precautions Precautions: Fall Restrictions Weight Bearing Restrictions: No      Mobility  Bed Mobility Overal bed mobility: Independent                Transfers Overall transfer level: Modified independent Equipment used: Rolling walker (2 wheeled)             General transfer comment: Mod I with front wheeled walker  Ambulation/Gait Ambulation/Gait assistance: Supervision;Min guard Gait Distance (Feet): 450 Feet Assistive device: Rolling walker (2 wheeled) Gait Pattern/deviations: Step-through pattern;Decreased stride length;Trunk flexed Gait velocity: Decreased Gait velocity interpretation: 1.31 - 2.62 ft/sec, indicative of limited community ambulator General Gait Details: Mostly steady gait with RW and intermittent min guard for balance; picking up RW to turn, cues for this  Stairs            Wheelchair Mobility    Modified Rankin (Stroke Patients Only)        Balance Overall balance assessment: Needs assistance   Sitting balance-Leahy Scale: Good       Standing balance-Leahy Scale: Fair                               Pertinent Vitals/Pain Pain Assessment: No/denies pain    Home Living Family/patient expects to be discharged to:: Private residence Living Arrangements: Alone Available Help at Discharge: Friend(s) Type of Home: House Home Access: Stairs to enter   Technical brewer of Steps: 3 Home Layout: One level Home Equipment: Cane - single point;Grab bars - tub/shower      Prior Function Level of Independence: Independent with assistive device(s)         Comments: Cooks for self, does not drive, uses cane for amb; able to do grocery shopping, friend drives     Hand Dominance        Extremity/Trunk Assessment   Upper Extremity Assessment Upper Extremity Assessment: LUE deficits/detail LUE Deficits / Details: h/o LUE involuntary movements from previous stroke (improved with pt upright and holding RW)    Lower Extremity Assessment Lower Extremity Assessment: Overall WFL for tasks assessed    Cervical / Trunk Assessment Cervical / Trunk Assessment: Kyphotic  Communication   Communication: No difficulties  Cognition Arousal/Alertness: Awake/alert Behavior During Therapy: WFL for tasks assessed/performed Overall Cognitive Status: No family/caregiver present to determine baseline cognitive functioning  General Comments: Some decreased insight into current deficits and safety awareness      General Comments General comments (skin integrity, edema, etc.): Dry hands, removed 2 bandages from R 2nd and 3rd distal phalange    Exercises     Assessment/Plan    PT Assessment Patient needs continued PT services  PT Problem List Decreased strength;Decreased activity tolerance;Decreased balance;Decreased mobility;Decreased knowledge of use of  DME       PT Treatment Interventions DME instruction;Gait training;Stair training;Functional mobility training;Therapeutic exercise;Balance training;Therapeutic activities;Patient/family education    PT Goals (Current goals can be found in the Care Plan section)  Acute Rehab PT Goals Patient Stated Goal: To go home. PT Goal Formulation: With patient Time For Goal Achievement: 03/17/20 Potential to Achieve Goals: Good    Frequency Min 3X/week   Barriers to discharge        Co-evaluation               AM-PAC PT "6 Clicks" Mobility  Outcome Measure Help needed turning from your back to your side while in a flat bed without using bedrails?: None Help needed moving from lying on your back to sitting on the side of a flat bed without using bedrails?: None Help needed moving to and from a bed to a chair (including a wheelchair)?: A Little Help needed standing up from a chair using your arms (e.g., wheelchair or bedside chair)?: A Little Help needed to walk in hospital room?: A Little Help needed climbing 3-5 steps with a railing? : A Little 6 Click Score: 20    End of Session Equipment Utilized During Treatment: Gait belt Activity Tolerance: Patient tolerated treatment well Patient left: in chair;with chair alarm set;with call bell/phone within reach Nurse Communication: Mobility status PT Visit Diagnosis: Unsteadiness on feet (R26.81);Other abnormalities of gait and mobility (R26.89);Muscle weakness (generalized) (M62.81)    Time: 1740-8144 PT Time Calculation (min) (ACUTE ONLY): 30 min   Charges:   PT Evaluation $PT Eval Moderate Complexity: 1 Mod PT Treatments $Therapeutic Activity: 8-22 mins   Mabeline Caras, PT, DPT Acute Rehabilitation Services  Pager [phone] Office [place] 03/03/2020, 5:07 PM

## 2020-03-03 NOTE — Progress Notes (Signed)
  Date: 03/03/2020  Patient name: Derrick Hendrix  Medical record number: [id number]  Date of birth: May 23, 1941        I have seen and evaluated this patient and I have discussed the plan of care with the house staff. Please see Dr. Chase Picket note for complete details. I concur with her findings and plan.     Sid Falcon, MD 03/03/2020, 9:39 PM

## 2020-03-03 NOTE — ED Provider Notes (Signed)
[facility] EMERGENCY DEPARTMENT Provider Note   CSN: [id number] Arrival date & time: 03/02/20  2045     History Chief Complaint  Patient presents with  . Abnormal Labs    Derrick Hendrix is a 79 y.o. male.  79 y/o male with hx of CKD, HLD, HTN, PAD, CVA presents to the ED at the advise of his PCP. Patient with routine outpatient follow up on 02/20/20. Was called today after he was found to have acute on chronic kidney failure with hyperkalemia. Is prescribed Lasix and supplemental potassium. States he has been compliant with all of his medications. Patient reporting some increased fatigue. Notes polyuria, but this has been ongoing x 1 year. No fevers, CP, SOB.  The history is provided by the patient. No language interpreter was used.       Past Medical History:  Diagnosis Date  . Abscess of foot without toes, left 08/28/2017  . Chronic kidney disease (CKD), stage III (moderate)   . Constipation 11/07/2014  . Daily headache    "lately" (04/17/2016)  . Depression   . Dizziness 03/07/2016  . Headache 06/26/2014  . Heart murmur   . High cholesterol   . Hyperkalemia 08/2014  . Hyperkalemia 10/20/2014  . Hyperkalemia 10/05/2018  . Hyperlipemia 11/11/2016  . Hypertension   . Neuropathic pain 02/06/2015  . Osteomyelitis ([place])   . PAD (peripheral artery disease) ([place])   . Paronychia of great toe, right   . Preop cardiovascular exam   . Right foot pain 02/09/2017  . Stable angina ([place]) 06/27/2014  . Stroke ([place]) 03/2016   hx of PAD; j"ust lots of headaches since" (04/17/2016)  . Vitamin B12 deficiency 06/27/2014  . Weight loss, unintentional 06/27/2014    Patient Active Problem List   Diagnosis Date Noted  . Epistaxis 02/20/2020  . Atrial fibrillation ([place]) 10/14/2018  . Type 2 diabetes mellitus ([place]) 09/04/2017  . Hemiballism 08/31/2017  . Ischemic pain of foot, right   . Severe peripheral arterial disease ([place]) 02/27/2017  . Cerebellar infarct ([place]) 05/06/2016    . Benign prostatic hyperplasia with urinary obstruction   . Orthostatic hypotension 04/04/2016  . Urinary retention 03/20/2016  . Cerebrovascular accident (CVA) due to occlusion of left posterior cerebral artery ([place])   . Dizziness 03/07/2016  . Essential hypertension   . Normocytic anemia 10/20/2014  . Hyporeninemic hypoaldosteronism ([place]) 08/24/2014  . Healthcare maintenance 07/13/2014  . Atherosclerosis of leg with intermittent claudication, R 06/29/2014  . Current smoker 06/27/2014  . Alcohol use ([place]) 06/27/2014  . CKD (chronic kidney disease), stage III 06/26/2014    Past Surgical History:  Procedure Laterality Date  . ABDOMINAL AORTOGRAM W/LOWER EXTREMITY N/A 03/02/2017   Procedure: Abdominal Aortogram w/Lower Extremity;  Surgeon: Conrad [place], MD;  Location: [facility];  Service: Cardiovascular;  Laterality: N/A;  . EP IMPLANTABLE DEVICE N/A 03/04/2016   Procedure: Loop Recorder Insertion;  Surgeon: Thompson Grayer, MD;  Location: [facility];  Service: Cardiovascular;  Laterality: N/A;  . PERIPHERAL VASCULAR INTERVENTION Right 03/06/2017   Procedure: Peripheral Vascular Intervention;  Surgeon: Elam Dutch, MD;  Location: [facility];  Service: Cardiovascular;  Laterality: Right;  SFA       Family History  Problem Relation Age of Onset  . Hypertension Mother        died age 91  . Diabetes Mother     Social History   Tobacco Use  . Smoking status: Former Smoker  Years: 66.00    Types: Cigarettes    Start date: 06/23/2017  . Smokeless tobacco: Never Used  Substance Use Topics  . Alcohol use: No    Alcohol/week: 0.0 standard drinks    Comment: 04/17/2016 "nothing in the last 3-4 years"  . Drug use: No    Home Medications Prior to Admission medications   Medication Sig Start Date End Date Taking? Authorizing Provider  acetaminophen (TYLENOL) 500 MG tablet Take 2 tablets (1,000 mg total) by mouth every 6 (six) hours as needed for mild pain  (or Fever >/= 101). 04/05/16  Yes Loleta Chance, MD  amLODipine (NORVASC) 10 MG tablet TAKE ONE TABLET BY MOUTH DAILY Patient taking differently: Take 10 mg by mouth daily.  11/08/19  Yes [name], [name], MD  atorvastatin (LIPITOR) 80 MG tablet TAKE ONE TABLET BY MOUTH DAILY Patient taking differently: Take 80 mg by mouth daily at 6 PM.  10/11/19  Yes [name], Obed K, MD  clonazePAM (KLONOPIN) 0.25 MG disintegrating tablet DISSOLVE ONE TABLET UNDER THE TONGUE THREE TIMES A DAY Patient taking differently: Take 0.25 mg by mouth 3 (three) times daily.  09/15/18  Yes Axel Filler, MD  docusate sodium (COLACE) 100 MG capsule Take 1 capsule (100 mg total) by mouth daily. 06/02/18  Yes Allred, Jeneen Rinks, MD  ELIQUIS 2.5 MG TABS tablet TAKE ONE TABLET BY MOUTH TWICE A DAY ** APPOINTMENT NEEDED INTERNAL MEDICINE [phone] 10/11/19  Yes [name], [name], MD  finasteride (PROSCAR) 5 MG tablet TAKE ONE TABLET BY MOUTH DAILY Patient taking differently: Take 5 mg by mouth daily.  10/11/19  Yes [name], [name], MD  fludrocortisone (FLORINEF) 0.1 MG tablet TAKE ONE TABLET BY MOUTH TWICE A DAY Patient taking differently: Take 0.1 mg by mouth 2 (two) times daily.  10/11/19  Yes [name], [name], MD  furosemide (LASIX) 40 MG tablet TAKE ONE TABLET BY MOUTH DAILY Patient taking differently: Take 40 mg by mouth daily.  11/08/19  Yes [name], [name], MD  ipratropium (ATROVENT) 0.06 % nasal spray Place 2 sprays into the nose 3 (three) times daily. Patient taking differently: Place 2 sprays into the nose daily as needed for rhinitis.  02/06/15  Yes Luan Moore, MD  nitroGLYCERIN (NITROSTAT) 0.4 MG SL tablet Place 1 tablet (0.4 mg total) under the tongue every 5 (five) minutes x 3 doses as needed for chest pain. 11/29/14  Yes Luan Moore, MD  tamsulosin (FLOMAX) 0.4 MG CAPS capsule TAKE ONE CAPSULE BY MOUTH DAILY AFTER SUPPER Patient taking differently: Take 0.4 mg by mouth daily after supper.  02/16/20  Yes [name], MD     Allergies    Aspirin  Review of Systems   Review of Systems  Ten systems reviewed and are negative for acute change, except as noted in the HPI.    Physical Exam Updated Vital Signs BP (!) 161/81 (BP Location: Right Arm)   Pulse 89   Temp 97.9 F (36.6 C) (Oral)   Resp 20   Ht 6\' 2"  (1.88 m)   Wt 108.9 kg   SpO2 100%   BMI 30.81 kg/m   Physical Exam Vitals and nursing note reviewed.  Constitutional:      General: He is not in acute distress.    Appearance: He is well-developed. He is not diaphoretic.     Comments: Patient in NAD  HENT:     Head: Normocephalic and atraumatic.  Eyes:     General: No scleral icterus.  Conjunctiva/sclera: Conjunctivae normal.  Cardiovascular:     Rate and Rhythm: Normal rate and regular rhythm.     Pulses: Normal pulses.  Pulmonary:     Effort: Pulmonary effort is normal. No respiratory distress.     Comments: Respirations even and unlabored Musculoskeletal:        General: Normal range of motion.     Cervical back: Normal range of motion.  Skin:    General: Skin is warm and dry.     Coloration: Skin is not pale.     Findings: No erythema.  Neurological:     Mental Status: He is alert and oriented to person, place, and time.     Comments: GCS 15. Answers questions appropriately and follows commands. Involuntary movements, most notable to the LUE  Psychiatric:        Behavior: Behavior normal.     ED Results / Procedures / Treatments   Labs (all labs ordered are listed, but only abnormal results are displayed) Labs Reviewed  BASIC METABOLIC PANEL - Abnormal; Notable for the following components:      Result Value   Potassium 5.8 (*)    Chloride 114 (*)    CO2 17 (*)    Glucose, Bld 149 (*)    BUN 68 (*)    Creatinine, Ser 4.15 (*)    Calcium 8.2 (*)    GFR calc non Af Amer 13 (*)    GFR calc Af Amer 15 (*)    All other components within normal limits  CBC - Abnormal; Notable for the following components:   RBC  3.60 (*)    Hemoglobin 10.7 (*)    HCT 35.9 (*)    MCHC 29.8 (*)    All other components within normal limits  URINALYSIS, ROUTINE W REFLEX MICROSCOPIC - Abnormal; Notable for the following components:   Protein, ur >=300 (*)    All other components within normal limits  SARS CORONAVIRUS 2 (TAT 6-24 HRS)  PROTEIN / CREATININE RATIO, URINE  SODIUM, URINE, RANDOM  CREATININE, URINE, RANDOM  CBG MONITORING, ED    EKG EKG Interpretation  Date/Time:  Friday March 02 2020 20:55:55 EDT Ventricular Rate:  99 PR Interval:    QRS Duration: 106 QT Interval:  330 QTC Calculation: 423 R Axis:   71 Text Interpretation: Accelerated Junctional rhythm Left ventricular hypertrophy with repolarization abnormality ( Sokolow-Lyon ) Abnormal ECG Confirmed by Orpah Greek [phone]) on 03/03/2020 2:37:53 AM   Radiology No results found.  Procedures .Critical Care Performed by: Antonietta Breach, PA-C Authorized by: Antonietta Breach, PA-C   Critical care provider statement:    Critical care time (minutes):  45   Critical care was necessary to treat or prevent imminent or life-threatening deterioration of the following conditions:  Metabolic crisis and renal failure (hyperkalemia; acidosis)   Critical care was time spent personally by me on the following activities:  Discussions with consultants, evaluation of patient's response to treatment, examination of patient, ordering and performing treatments and interventions, ordering and review of laboratory studies, ordering and review of radiographic studies, pulse oximetry, re-evaluation of patient's condition, obtaining history from patient or surrogate and review of old charts   (including critical care time)  Medications Ordered in ED Medications  sodium chloride flush (NS) 0.9 % injection 3 mL (has no administration in time range)  0.9 %  sodium chloride infusion (has no administration in time range)  albuterol (PROVENTIL) (2.5 MG/3ML) 0.083%  nebulizer solution 2.5 mg (has no administration  in time range)  insulin aspart (novoLOG) injection 10 Units (has no administration in time range)    And  dextrose 50 % solution 50 mL (has no administration in time range)  sodium zirconium cyclosilicate (LOKELMA) packet 10 g (has no administration in time range)  atorvastatin (LIPITOR) tablet 80 mg (has no administration in time range)  clonazePAM (KLONOPIN) disintegrating tablet 0.25 mg (has no administration in time range)  docusate sodium (COLACE) capsule 100 mg (has no administration in time range)  finasteride (PROSCAR) tablet 5 mg (has no administration in time range)  fludrocortisone (FLORINEF) tablet 100 mcg (has no administration in time range)  ipratropium (ATROVENT) 0.06 % nasal spray 2 spray (has no administration in time range)  tamsulosin (FLOMAX) capsule 0.4 mg (has no administration in time range)  apixaban (ELIQUIS) tablet 2.5 mg (has no administration in time range)  amLODipine (NORVASC) tablet 10 mg (has no administration in time range)  lactated ringers infusion (has no administration in time range)    ED Course  I have reviewed the triage vital signs and the nursing notes.  Pertinent labs & imaging results that were available during my care of the patient were reviewed by me and considered in my medical decision making (see chart for details).    MDM Rules/Calculators/A&P                      79 year old male presenting to the emergency department at the advice of his PCP after outpatient labs revealed AKI superimposed on CKD.  Patient also with hyperkalemia of 5.8.  He complains of mild fatigue, but denies any additional symptoms.  Specifically no chest pain, shortness of breath, vomiting, fevers.  Suspect his potassium and AKI to be related to his daily medications.  He is prescribed supplemental potassium tablets as well as diuretics.  Started on IV fluid hydration with plan for admission to IM teaching  service.   Final Clinical Impression(s) / ED Diagnoses Final diagnoses:  Acute kidney injury superimposed on chronic kidney disease ([place])  Hyperkalemia  Normal anion gap metabolic acidosis  AKI (acute kidney injury) [facility])    Rx / DC Orders ED Discharge Orders    None       Antonietta Breach, PA-C 03/03/20 0303    Orpah Greek, MD 03/03/20 [phone]

## 2020-03-03 NOTE — H&P (Signed)
Date: 03/03/2020               Patient Name:  Derrick Hendrix MRN: [id number]  DOB: [birth year] Age / Sex: 79 y.o., male   PCP: [name], MD         Medical Service: Internal Medicine Teaching Service         Attending Physician: Dr. Sid Falcon, MD    First Contact: Dr. Darrick Meigs Pager: [phone]  Second Contact: Dr. Sharon Seller Pager: [phone]       After Hours (After 5p/  First Contact Pager: [phone]  weekends / holidays): Second Contact Pager: [phone]   Chief Complaint: Hyperkalemia  History of Present Illness:   Derrick Hendrix is an 79 year old male with CKD 3, BPH with urinary obstruction, hyporeninemic hypoaldosteronism, PAD, type 2 diabetes mellitus, A. fib, history of CVA to left posterior cerebral artery who was told to present to the ED due to acute worsening of renal function. The patient was seen by his PCP on 02/20/2020 at which time a BMP was ordered to check his renal function given that the patient has hypertension. The creatinine resulted in elevation to 4.17 from baseline 2.1-2.7 along with a potassium elevation of 5.6 and bicarb of 13. The patient was requested to report to the local emergency room immediately for further management.  Patient states that he has been feeling very tired over the last 48 hours. He has one can of low salt canned soup daily with crackers. He states that he has not had any changes in appetite, nausea/vomiting, abdominal pain, dysuria. Has a good urinary flow.    Meds:  acetaminophen (TYLENOL) 500 MG tablet Take 2 tablets (1,000 mg total) by mouth every 6 (six) hours as needed for mild pain (or Fever >/= 101).  amLODipine (NORVASC) 10 MG tablet TAKE ONE TABLET BY MOUTH DAILY (Patient taking differently: Take 10 mg by mouth daily. )  atorvastatin (LIPITOR) 80 MG tablet TAKE ONE TABLET BY MOUTH DAILY (Patient taking differently: Take 80 mg by mouth daily at 6 PM. )  clonazePAM (KLONOPIN) 0.25 MG disintegrating tablet DISSOLVE ONE TABLET  UNDER THE TONGUE THREE TIMES A DAY (Patient taking differently: Take 0.25 mg by mouth 3 (three) times daily. )  docusate sodium (COLACE) 100 MG capsule Take 1 capsule (100 mg total) by mouth daily.  ELIQUIS 2.5 MG TABS tablet TAKE ONE TABLET BY MOUTH TWICE A DAY  APPOINTMENT NEEDED INTERNAL MEDICINE [phone]  finasteride (PROSCAR) 5 MG tablet TAKE ONE TABLET BY MOUTH DAILY (Patient taking differently: Take 5 mg by mouth daily. )  fludrocortisone (FLORINEF) 0.1 MG tablet TAKE ONE TABLET BY MOUTH TWICE A DAY (Patient taking differently: Take 0.1 mg by mouth 2 (two) times daily. )  furosemide (LASIX) 40 MG tablet TAKE ONE TABLET BY MOUTH DAILY (Patient taking differently: Take 40 mg by mouth daily. )  ipratropium (ATROVENT) 0.06 % nasal spray Place 2 sprays into the nose 3 (three) times daily. (Patient taking differently: Place 2 sprays into the nose daily as needed for rhinitis. )  nitroGLYCERIN (NITROSTAT) 0.4 MG SL tablet Place 1 tablet (0.4 mg total) under the tongue every 5 (five) minutes x 3 doses as needed for chest pain.  tamsulosin (FLOMAX) 0.4 MG CAPS capsule TAKE ONE CAPSULE BY MOUTH DAILY AFTER SUPPER (Patient taking differently: Take 0.4 mg by mouth daily after supper. )    Allergies: Allergies as of 03/02/2020 - Review Complete 03/02/2020  Allergen Reaction Noted  . Aspirin Other (  See Comments) 08/24/2014   Past Medical History:  Diagnosis Date  . Abscess of foot without toes, left 08/28/2017  . Chronic kidney disease (CKD), stage III (moderate)   . Constipation 11/07/2014  . Daily headache    "lately" (04/17/2016)  . Depression   . Dizziness 03/07/2016  . Headache 06/26/2014  . Heart murmur   . High cholesterol   . Hyperkalemia 08/2014  . Hyperkalemia 10/20/2014  . Hyperkalemia 10/05/2018  . Hyperlipemia 11/11/2016  . Hypertension   . Neuropathic pain 02/06/2015  . Osteomyelitis ([place])   . PAD (peripheral artery disease) ([place])   . Paronychia of great toe, right   . Preop  cardiovascular exam   . Right foot pain 02/09/2017  . Stable angina ([place]) 06/27/2014  . Stroke ([place]) 03/2016   hx of PAD; j"ust lots of headaches since" (04/17/2016)  . Vitamin B12 deficiency 06/27/2014  . Weight loss, unintentional 06/27/2014    Family History:   Family History  Problem Relation Age of Onset  . Hypertension Mother        died age 59  . Diabetes Mother   HTN: mother, father  Diabetes: Mother Kidney disease: Unsure Cancer: Unsure  Social History:   Social History   Socioeconomic History  . Marital status: Single    Spouse name: Not on file  . Number of children: Not on file  . Years of education: Not on file  . Highest education level: Not on file  Occupational History  . Not on file  Tobacco Use  . Smoking status: Former Smoker    Years: 66.00    Types: Cigarettes    Start date: 06/23/2017  . Smokeless tobacco: Never Used  Substance and Sexual Activity  . Alcohol use: No    Alcohol/week: 0.0 standard drinks    Comment: 04/17/2016 "nothing in the last 3-4 years"  . Drug use: No  . Sexual activity: Never  Other Topics Concern  . Not on file  Social History Narrative   Works in the yard      [place] Strain:   . Difficulty of Paying Living Expenses:   Food Insecurity:   . Worried About Charity fundraiser in the Last Year:   . Arboriculturist in the Last Year:   Transportation Needs:   . Film/video editor (Medical):   [name] Lack of Transportation (Non-Medical):   Physical Activity:   . Days of Exercise per Week:   . Minutes of Exercise per Session:   Stress:   . Feeling of Stress :   Social Connections:   . Frequency of Communication with Friends and Family:   . Frequency of Social Gatherings with Friends and Family:   . Attends Religious Services:   . Active Member of Clubs or Organizations:   . Attends Archivist Meetings:   [name] Marital Status:   Lives alone at home, able to complete ADLs  unassisted, enjoys gardening. Retired Nature conservation officer.  Tobacco: Former user of tobacco, quit 15 years ago ETOH: Former ETOH usage, quit.    Review of Systems: A complete ROS was negative except as per HPI.   Physical Exam: Blood pressure (!) 161/81, pulse 89, temperature 97.9 F (36.6 C), temperature source Oral, resp. rate 20, height 6\' 2"  (1.88 m), weight 108.9 kg, SpO2 100 %. Physical Exam Vitals reviewed.  Constitutional:      Appearance: Normal appearance.     Comments: Patient in resting  in bed, hemiballismus noted on L side. No acute distress  HENT:     Head: Normocephalic and atraumatic.  Eyes:     General: No scleral icterus.       Right eye: No discharge.        Left eye: No discharge.     Conjunctiva/sclera: Conjunctivae normal.  Cardiovascular:     Rate and Rhythm: Normal rate and regular rhythm.     Pulses: Normal pulses.     Heart sounds: Normal heart sounds. No murmur. No friction rub. No gallop.   Pulmonary:     Effort: Pulmonary effort is normal.     Breath sounds: Normal breath sounds. No wheezing, rhonchi or rales.  Abdominal:     General: Abdomen is flat. Bowel sounds are normal.     Tenderness: There is no abdominal tenderness. There is no right CVA tenderness, left CVA tenderness or guarding.  Musculoskeletal:     Comments: L greater toenail onychauxis appreciated. Tenderness to palpation.   Neurological:     Mental Status: He is alert and oriented to person, place, and time.  Psychiatric:        Mood and Affect: Mood normal.        Behavior: Behavior normal.     EKG: personally reviewed my interpretation is Junctional rhythm with LV hypertrophy.   CXR: Not done  Assessment & Plan by Problem: Active Problems:   Acute on chronic renal failure [facility])  Mr. Cosman is an 79 year old male with a history of CKD 3, BPH with urinary obstruction, type 2 diabetes mellitus, hyporeninemic hypoaldosteronism who presents with a recent worsening acute on  chronic renal failure.  Acute on chronic kidney disease Patient has a baseline creatinine of 2.1-2.7 which worsened to creatinine of 4 and GFR of 15. UA showed presence of protein urea without any leukocytosis or nitrites.    Renal ultrasound showed multiple bilateral renal cysts and increased echotexture with chronic medical renal disease  Attempting a trial of IV fluids to note whether there is improvement in kidney function with volume repletion. Patient's blood pressure and diabetes appear to be well controlled at baseline and therefore it is unlikely that these chronic diseases are contributory to the renal failure. He has not had any recent exposure to contrast.   - LR infusion. -Urine sodium and creatinine and serum sodium and creatinine to calculate FeNa  -Pending protein/creatinine ratio -Bladder scan and post void residual volume -Consider echo to determine if there is a possible component of cardiorenal syndrome if patient becomes symptomatic  Probable BPH with urinary retention Patient was diagnosed with possible BPH in 2015 at which time he had difficulty starting a stream, dribbling and feeling of incomplete emptying of his bladder.  PVR at that time was 182 mL.  The patient was on Flomax 0.4 mg daily at that time.  Patient has never seen a urologist even though recommendations were made previously.  I do not see a PSA on record. -Continue tamsulosin 0.4 mg daily -Pending renal ultrasound -Bladder scan every 4 hours -Postvoid residual volume every 4 hours  Hyporeninemic Hypoaldosteronism -Continue Florinef 0.1 mg daily  Diabetes Mellitus Type 2 Patient's last A1c was 5.5 on 02/20/2020 with lifestyle modifications. -SSI  Essential Hypertension The patient's blood pressure since admission has ranged 150-220s/70-80s.  At home the patient takes amlodipine 10 mg daily, Lasix 40 mg daily, tamsulosin 0.4 mg daily. -Resume home amlodipine 10 mg daily -Resume home Lasix 40 mg  daily  Dispo: Admit  patient to Inpatient with expected length of stay greater than 2 midnights.  Signed: Lars Mage, MD 03/03/2020, 3:52 AM

## 2020-03-04 LAB — BASIC METABOLIC PANEL
Anion gap: 10 (ref 5–15)
BUN: 55 mg/dL — ABNORMAL HIGH (ref 8–23)
CO2: 19 mmol/L — ABNORMAL LOW (ref 22–32)
Calcium: 8.2 mg/dL — ABNORMAL LOW (ref 8.9–10.3)
Chloride: 116 mmol/L — ABNORMAL HIGH (ref 98–111)
Creatinine, Ser: 3.2 mg/dL — ABNORMAL HIGH (ref 0.61–1.24)
GFR calc Af Amer: 20 mL/min — ABNORMAL LOW (ref >60)
GFR calc non Af Amer: 17 mL/min — ABNORMAL LOW (ref >60)
Glucose, Bld: 82 mg/dL (ref 70–99)
Potassium: 5.1 mmol/L (ref 3.5–5.1)
Sodium: 145 mmol/L (ref 135–145)

## 2020-03-04 LAB — GLUCOSE, CAPILLARY
Glucose-Capillary: 89 mg/dL (ref 70–99)
Glucose-Capillary: 91 mg/dL (ref 70–99)

## 2020-03-04 MED ORDER — LACTATED RINGERS IV SOLN: INTRAVENOUS | Status: DC

## 2020-03-04 MED ORDER — HYDRALAZINE HCL 10 MG PO TABS: 10.0000 mg | ORAL_TABLET | Freq: Three times a day (TID) | ORAL | 0 refills | Status: DC

## 2020-03-04 MED ORDER — FUROSEMIDE 40 MG PO TABS: 20.0000 mg | ORAL_TABLET | Freq: Every day | ORAL | 0 refills | Status: DC

## 2020-03-04 NOTE — TOC Transition Note (Signed)
Transition of Care [facility]) - CM/SW Discharge Note   Patient Details  Name: Derrick Hendrix MRN: [id number] Date of Birth: 28-Mar-1941  Transition [facility]) CM/SW Contact:  Carles Collet, RN Phone Number: 03/04/2020, 1:00 PM   Clinical Narrative:    Spoke w patient over the phone. He declines [facility] services. He states that he will go home via cab, he confirmed twice that he has the money to pay for his cab. RW ordered. Patient understands it will be 1-2 hours before it is delivered to the room.     Final next level of care: Home/Self Care     Patient Goals and CMS Choice Patient states their goals for this hospitalization and ongoing recovery are:: to go home      Discharge Placement                       Discharge Plan and Services                DME Arranged: Walker rolling DME Agency: [employer] Date DME Agency Contacted: 03/04/20 Time DME Agency Contacted: 1300 Representative spoke with at DME Agency: [place] ([place]) Interventions     Readmission Risk Interventions No flowsheet data found.

## 2020-03-04 NOTE — Evaluation (Signed)
Occupational Therapy Evaluation Patient Details Name: Derrick Hendrix MRN: [id number] DOB: Aug 09, 1941 Today's Date: 03/04/2020    History of Present Illness Pt is an 79 y.o. male who was admitted 03/02/20 with acute worsening of his renal function and hyperkalemia. PMH includes CKD, HLD, HTN, PAD, DM2, Afib, CVA (with h/o involuntary L UE movement).   Clinical Impression   Pt PTA: Pt living at home alone, independently. Pt currently limited by previous tremors in LUE when standing. Pt performing ADL routine with no physical assist with modified independence to supervisonA with ADL in standing. Pt does not require continued OT skilled services. Pt at functional baseline. OT signing off. Thank you for this referral.     Follow Up Recommendations  No OT follow up;Supervision - Intermittent    Equipment Recommendations  None recommended by OT    Recommendations for Other Services       Precautions / Restrictions Precautions Precautions: Fall Restrictions Weight Bearing Restrictions: No      Mobility Bed Mobility Overal bed mobility: Independent                Transfers Overall transfer level: Modified independent                    Balance Overall balance assessment: Needs assistance   Sitting balance-Leahy Scale: Good       Standing balance-Leahy Scale: Fair                             ADL either performed or assessed with clinical judgement   ADL Overall ADL's : At baseline                                       General ADL Comments: Pt standing at sink for bathing routine in standing. Pt grooming with supervisionA. Pt requiring no physical assist.     Vision Baseline Vision/History: No visual deficits Vision Assessment?: No apparent visual deficits     Perception     Praxis      Pertinent Vitals/Pain Pain Assessment: 0-10 Pain Score: 0-No pain Pain Intervention(s): Monitored during session     Hand Dominance  Right   Extremity/Trunk Assessment Upper Extremity Assessment Upper Extremity Assessment: Generalized weakness;LUE deficits/detail LUE Deficits / Details: h/o involuntary movements/tremors with LUE from previous CVA.   Lower Extremity Assessment Lower Extremity Assessment: Overall WFL for tasks assessed   Cervical / Trunk Assessment Cervical / Trunk Assessment: Kyphotic   Communication Communication Communication: No difficulties   Cognition Arousal/Alertness: Awake/alert Behavior During Therapy: WFL for tasks assessed/performed Overall Cognitive Status: No family/caregiver present to determine baseline cognitive functioning                                 General Comments: Pt following all commands. Pt with tremors on LUE making pt appears less stable in standing.   General Comments  VSS on RA.    Exercises     Shoulder Instructions      Home Living Family/patient expects to be discharged to:: Private residence Living Arrangements: Alone Available Help at Discharge: Friend(s) Type of Home: House Home Access: Stairs to enter [employer] of Steps: 3   Home Layout: One level     Bathroom Shower/Tub: Teacher, early years/pre: Standard  Home Equipment: Cane - single point;Grab bars - tub/shower          Prior Functioning/Environment Level of Independence: Independent with assistive device(s)        Comments: Cooks for self, does not drive, uses cane for amb; able to do grocery shopping, friend drives        OT Problem List: Decreased activity tolerance      OT Treatment/Interventions:      OT Goals(Current goals can be found in the care plan section) Acute Rehab OT Goals Patient Stated Goal: To go home.  OT Frequency:     Barriers to D/C:            Co-evaluation              AM-PAC OT "6 Clicks" Daily Activity     Outcome Measure Help from another person eating meals?: None Help from another person  taking care of personal grooming?: None Help from another person toileting, which includes using toliet, bedpan, or urinal?: None Help from another person bathing (including washing, rinsing, drying)?: A Little Help from another person to put on and taking off regular upper body clothing?: None Help from another person to put on and taking off regular lower body clothing?: None 6 Click Score: 23   End of Session Equipment Utilized During Treatment: Rolling walker;Gait belt Nurse Communication: Mobility status  Activity Tolerance: Patient tolerated treatment well Patient left: in chair;with call bell/phone within reach;with chair alarm set  OT Visit Diagnosis: Unsteadiness on feet (R26.81)                Time: 9741-6384 OT Time Calculation (min): 16 min Charges:  OT General Charges $OT Visit: 1 Visit OT Evaluation $OT Eval Moderate Complexity: 1 Mod  Jefferey Pica, OTR/L Acute Rehabilitation Services Pager: [phone] Office: [phone]   Tsuruko Murtha C 03/04/2020, 1:19 PM

## 2020-03-04 NOTE — Discharge Summary (Signed)
Name: [name] MRN: [id number] DOB: 05-07-1941 79 y.o. PCP: [name], MD  Date of Admission: 03/02/2020  8:50 PM Date of Discharge: 03/04/2020 Attending Physician: No att. providers found  Discharge Diagnosis: 1. Hyperkalemia 2. Acute Kidney Injury 3. Hyporeninemic Hypoaldosteronism  Discharge Medications: Allergies as of 03/04/2020      Reactions   Aspirin Other (See Comments)   Nosebleeds       Medication List    TAKE these medications   acetaminophen 500 MG tablet Commonly known as: TYLENOL Take 2 tablets (1,000 mg total) by mouth every 6 (six) hours as needed for mild pain (or Fever >/= 101).   amLODipine 10 MG tablet Commonly known as: NORVASC TAKE ONE TABLET BY MOUTH DAILY   atorvastatin 80 MG tablet Commonly known as: LIPITOR TAKE ONE TABLET BY MOUTH DAILY What changed: when to take this   clonazePAM 0.25 MG disintegrating tablet Commonly known as: KLONOPIN DISSOLVE ONE TABLET UNDER THE TONGUE THREE TIMES A DAY What changed: See the new instructions.   docusate sodium 100 MG capsule Commonly known as: Colace Take 1 capsule (100 mg total) by mouth daily.   Eliquis 2.5 MG Tabs tablet Generic drug: apixaban TAKE ONE TABLET BY MOUTH TWICE A DAY - APPOINTMENT NEEDED INTERNAL MEDICINE [phone]   finasteride 5 MG tablet Commonly known as: PROSCAR TAKE ONE TABLET BY MOUTH DAILY   fludrocortisone 0.1 MG tablet Commonly known as: FLORINEF TAKE ONE TABLET BY MOUTH TWICE A DAY   furosemide 40 MG tablet Commonly known as: LASIX Take 0.5 tablets (20 mg total) by mouth daily. What changed: how much to take   hydrALAZINE 10 MG tablet Commonly known as: APRESOLINE Take 1 tablet (10 mg total) by mouth 3 (three) times daily.   ipratropium 0.06 % nasal spray Commonly known as: ATROVENT Place 2 sprays into the nose 3 (three) times daily. What changed:   when to take this  reasons to take this   nitroGLYCERIN 0.4 MG SL tablet Commonly known  as: NITROSTAT Place 1 tablet (0.4 mg total) under the tongue every 5 (five) minutes x 3 doses as needed for chest pain.   tamsulosin 0.4 MG Caps capsule Commonly known as: FLOMAX TAKE ONE CAPSULE BY MOUTH DAILY AFTER SUPPER What changed: See the new instructions.            Durable Medical Equipment  (From admission, onward)         Start     Ordered   03/04/20 0000  For home use only DME 4 wheeled rolling walker with seat    Question:  Patient needs a walker to treat with the following condition  Answer:  Physical deconditioning   03/04/20 1144          Disposition and follow-up:   [name] was [facility] from [facility] in Stable condition.  At the hospital follow up visit please address:  1.  Hyperkalemia: 2/2 pre-renal AKI with worsening CKD. Improved with lokelma. AKI resolved with fluids and holding lasix. If worsening hyperkalemia w/o worsening kidneys consider increasing florinef although has had hypokalemia due to this in the past.  2. AKI on CKD: pre-renal w/non-nephrotic range proteinuria. Pr/Cr: 2.16. Improved with fluids. Lasix decreased to 20 mg qd. Assess fluid status. Consider outpatient nephrology referral.  3. Hyporeninemic Hypoaldosteronism: On florinef .1 mg bid. If persistent hyperkalemia consider increasing.  4. Hypertension: hydralazine 10 mg tid added to meds  2.  Labs / imaging needed at time  of follow-up: BMP  3.  Pending labs/ test needing follow-up: none  Follow-up Appointments:  [facility] Course by problem list: Derrick Hendrix is an 79yo male with PMH CKD, BPH, hyporeninemic hypoaldosteronism, TIIDM, atrial fibrillation and previous CVA with left sided hemiballismus who presented with acute on chronic renal failure and hyperkalemia. Renal Derrick Hendrix and bladder scan were done which showed no retention. He endorsed excessive urination and thirst despite drinking lots of water. Lasix was held and AKI  improved with fluids. His hyperkalemia was treated with lokelma once, with resolution of hyperkalemia. This was likely secondary to his AKI but possibly related to his hyporeninemic hypoaldosteronism for which he takes florinef .1mg . His florinef has required titration in the past, and he has had hypokalemia at higher doses but this may need to be increased again. At discharge his lasix was decreased to 20 mg qd from 40 mg qd. For hypertension hydralazine 10 mg tid was added to his regimen.   Discharge Vitals:   BP (!) 145/83 (BP Location: Right Arm)   Pulse 76   Temp 98.6 F (37 C) (Oral)   Resp 18   Ht 6\' 2"  (1.88 m)   Wt 93.4 kg   SpO2 97%   BMI 26.44 kg/m   Pertinent Labs, Studies, and Procedures:   03/03/20 IMPRESSION: Multiple bilateral renal cysts.  No acute findings.  No hydronephrosis.  Increased echotexture compatible with chronic medical renal disease. Electronically Signed   By: Rolm Baptise M.D.   On: 03/03/2020 03:45  Discharge Instructions: Discharge Instructions    Diet - low sodium heart healthy   Complete by: As directed    Discharge instructions   Complete by: As directed    You were hospitalized for acute kidney injury and high potassium. Thank you for allowing Derrick Hendrix to be part of your care.   We arranged for you to follow up at:   [facility]  [phone] They will call you to schedule an appointment.    Please note these changes made to your medications:   Please START taking:   Hydralazine 10 mg - take one pill three times a day in the morning, afternoon, and evening  Please change how you take:  Lasix 20 mg - take a 1/2 pill daily   If you start to notice increased lower extremity edema please call the Internal Medicine Center for an appointment.   For home use only DME 4 wheeled rolling walker with seat   Complete by: As directed    Patient needs a walker to treat with the following condition: Physical deconditioning    Increase activity slowly   Complete by: As directed       Signed: Marty Heck, DO 03/05/2020, 6:34 AM   Pager: [phone]

## 2020-03-04 NOTE — Progress Notes (Signed)
  Date: 03/04/2020  Patient name: Derrick Hendrix  Medical record number: [id number]  Date of birth: 1941/09/01   This patient's plan of care was discussed with the house staff. Please see Dr. Aurelio Jew note for complete details. I concur with her findings.   Sid Falcon, MD 03/04/2020, 9:01 PM

## 2020-03-04 NOTE — Plan of Care (Addendum)
Called by telemetry monitor tech x2 for ST-elevation noted on monitor. Skin warm and dry, denies any Chest pain or discomfort. Monitor alarms reviewed and patient had ST elevation since admit.  MD notified of ST elevation.  0605 Call back received from MD and new order for 12-lead EKG. 0630 12-lead EKG completed and placed on chart. No change in patient's status. NAD noted.

## 2020-03-04 NOTE — Progress Notes (Signed)
Verified with patient he has funds for a cab, he states he called his niece and she is on her way. Still waiting for the roller walking to be delivered to the patients room and niece to arrive.

## 2020-03-04 NOTE — Progress Notes (Signed)
   Subjective:   He is feeling well today. He has been urinating a lot. He states this is ongoing and was happening prior to admission as well. He tries to drink a lot of water to stay hydrated.   Objective:  Vital signs in last 24 hours: Vitals:   03/03/20 2125 03/03/20 2300 03/04/20 0356 03/04/20 0601  BP: 140/73   (!) 155/85  Pulse: 86   84  Resp: 16   16  Temp:  98.1 F (36.7 C)    TempSrc:  Oral    SpO2: 96%   98%  Weight:   93.4 kg   Height:        Constitution: NAD, appears stated age Cardio: RRR, no m/r/g, no LE edema  Respiratory: CTA, no w/r/r MSK: moving all extremities Neuro: normal affect, a&ox3 Skin: c/d/i   Assessment/Plan:  Active Problems:   Hyperkalemia   Acute on chronic renal failure (HCC)   Acute kidney injury superimposed on chronic kidney disease (HCC)   Normal anion gap metabolic acidosis  48IB male w/ PMH of CKD III, hyporeninemic hypoalosteronism, BPH with obstruction, TIIDM, atrial fibrillation, and PAD admitted for acute on chronic rena failure.   AKI on CKD III BPH Renal function continuing to improve with fluids. Excessive proteinuria present at admission suggests possible intrarenal with slightly elevated protein/creatinine ratio of 2.16. Renal US and bladder scans have not shown signs of urinary retention. Prior to admission he has also been urinating excessively and may need decrease in his lasix dose.   - cont. To hold lasix for now, decrease at discharge - will need to follow-up with nephrology outpatient  - cont. Fluids  - cont. flomax and finasteride   Hyperkalemia Hyporeninemic Hypoaldosteronism  Resolved. May need to consider increase in florinef at follow-up.   Essential Hypertension Cont. Hydralazine 10 tid, norvasc 10 qd  Paroxysmal Atrial Fibrillation  cont. eliquis   TIIDM Cont. SSI. Last A1c 5.1. Likely will not need insulin.   Normocytic Anemia Iron panel wnl. Likely secondary to his chronic renal disease. May  benefit from aranesp.   VTE: eliquis IVF: LR Code: full   Dispo: Anticipated discharge today or tomorrow.   Molli Hazard A, DO 03/04/2020, 7:19 AM Pager: [phone]

## 2020-03-05 ENCOUNTER — Ambulatory Visit: Payer: Medicare Other | Admitting: *Deleted

## 2020-03-05 ENCOUNTER — Ambulatory Visit: Payer: Medicare Other

## 2020-03-05 DIAGNOSIS — N183 Chronic kidney disease, stage 3 unspecified: Secondary | ICD-10-CM

## 2020-03-05 DIAGNOSIS — E1122 Type 2 diabetes mellitus with diabetic chronic kidney disease: Secondary | ICD-10-CM

## 2020-03-05 DIAGNOSIS — I480 Paroxysmal atrial fibrillation: Secondary | ICD-10-CM

## 2020-03-05 DIAGNOSIS — E2749 Other adrenocortical insufficiency: Secondary | ICD-10-CM

## 2020-03-05 NOTE — Chronic Care Management (AMB) (Signed)
Chronic Care Management    Clinical Social Work General Note  03/06/2020 Name: Aariv Medlock MRN: [id number] DOB: 03/02/1941  Rachit Grim is a 79 y.o. year old male who is a primary care patient of [name], [name], MD. The CCM was consulted to assist the patient with [employer] .   Mr. Ottaway was given information about Chronic Care Management services today including:  1. CCM service includes personalized support from designated clinical staff supervised by his physician, including individualized plan of care and coordination with other care providers 2. 24/7 contact phone numbers for assistance for urgent and routine care needs. 3. Service will only be billed when office clinical staff spend 20 minutes or more in a month to coordinate care. 4. Only one practitioner may furnish and bill the service in a calendar month. 5. The patient may stop CCM services at any time (effective at the end of the month) by phone call to the office staff. 6. The patient will be responsible for cost sharing (co-pay) of up to 20% of the service fee (after annual deductible is met).  Patient agreed to services and verbal consent obtained.   Review of patient status, including review of consultants reports, relevant laboratory and other test results, and collaboration with appropriate care team members and the patient's provider was performed as part of comprehensive patient evaluation and provision of chronic care management services.    SDOH (Social Determinants of Health) assessments and interventions performed:  Yes SDOH Interventions     Most Recent Value  SDOH Interventions  SDOH Interventions for the Following Domains  Financial Strain, Food Insecurity, Housing, Transportation  Transportation Interventions  -- [Uses SCAT]         Goals Addressed            This Visit's Progress   . "I have a lot of stray cats around my house that I can't take care of" (pt-stated)       Current  Barriers:  [name] Knowledge Barriers related to resources and support available to assist with stray cats on property.    Case Manager Clinical Goal(s):  [name] Over the next 30 days, patient will work with BSW to address needs related to removing stray cats from property.  . Over the next 30 days, BSW will collaborate with RN Care Manager to address care management and care coordination needs  Interventions:  . Patient interviewed and appropriate assessments performed  . Collaborated with RN Care Manager and patient to establish an individualized plan of care  . Discussed plans with patient for ongoing care management follow up and provided patient with direct contact information for care management team . Collaboration with RN Case Manager  . Contacted the following rescue organizations regarding possible assistance:  Advertising account executive, [employer], [employer], Herbalist, Engineer, agricultural and [place] [employer].  Left message with Orlin Hilding with the [facility].  None of the agencies contacted today will assist with permanent removal of cats from the property.  Programs assist by trapping the cats, spay or neuter surgery, then return cats to the property.    Patient Self Care Activities:  . Self administers medications as prescribed . Attends all scheduled provider appointments . Calls provider office for new concerns or questions  Initial goal documentation         Follow Up Plan: SW will follow up with patient by phone over the next 14 days  Ronn Melena, [place] Coordination Social Worker [place] [phone]

## 2020-03-05 NOTE — Chronic Care Management (AMB) (Signed)
Chronic Care Management   Initial Visit Note  03/05/2020 Name: Derrick Hendrix MRN: [id number] DOB: November 27, 1941  Referred by: [name], MD Reason for referral : Chronic Care Management (Type 2 DM, A fib, HTN, DM II, CKD stage 3)   Derrick Hendrix is a 79 y.o. year old male who is a primary care patient of [name], [name], MD. The CCM team was consulted for assistance with chronic disease management and care coordination needs related to Atrial Fibrillation, HTN, DMII and CKD Stage 3.  Review of patient status, including review of consultants reports, relevant laboratory and other test results, and collaboration with appropriate care team members and the patient's provider was performed as part of comprehensive patient evaluation and provision of chronic care management services.    SDOH (Social Determinants of Health) assessments performed: No See Care Plan activities for detailed interventions related to SDOH     Medications: Outpatient Encounter Medications as of 03/05/2020  Medication Sig  . acetaminophen (TYLENOL) 500 MG tablet Take 2 tablets (1,000 mg total) by mouth every 6 (six) hours as needed for mild pain (or Fever >/= 101).  [name] amLODipine (NORVASC) 10 MG tablet TAKE ONE TABLET BY MOUTH DAILY (Patient taking differently: Take 10 mg by mouth daily. )  . atorvastatin (LIPITOR) 80 MG tablet TAKE ONE TABLET BY MOUTH DAILY (Patient taking differently: Take 80 mg by mouth daily at 6 PM. )  . clonazePAM (KLONOPIN) 0.25 MG disintegrating tablet DISSOLVE ONE TABLET UNDER THE TONGUE THREE TIMES A DAY (Patient taking differently: Take 0.25 mg by mouth 3 (three) times daily. )  . docusate sodium (COLACE) 100 MG capsule Take 1 capsule (100 mg total) by mouth daily.  [name] ELIQUIS 2.5 MG TABS tablet TAKE ONE TABLET BY MOUTH TWICE A DAY  APPOINTMENT NEEDED INTERNAL MEDICINE [phone]  . finasteride (PROSCAR) 5 MG tablet TAKE ONE TABLET BY MOUTH DAILY (Patient taking differently: Take 5 mg by mouth  daily. )  . fludrocortisone (FLORINEF) 0.1 MG tablet TAKE ONE TABLET BY MOUTH TWICE A DAY (Patient taking differently: Take 0.1 mg by mouth 2 (two) times daily. )  . furosemide (LASIX) 40 MG tablet Take 0.5 tablets (20 mg total) by mouth daily.  . hydrALAZINE (APRESOLINE) 10 MG tablet Take 1 tablet (10 mg total) by mouth 3 (three) times daily.  [name] ipratropium (ATROVENT) 0.06 % nasal spray Place 2 sprays into the nose 3 (three) times daily. (Patient taking differently: Place 2 sprays into the nose daily as needed for rhinitis. )  . nitroGLYCERIN (NITROSTAT) 0.4 MG SL tablet Place 1 tablet (0.4 mg total) under the tongue every 5 (five) minutes x 3 doses as needed for chest pain.  . tamsulosin (FLOMAX) 0.4 MG CAPS capsule TAKE ONE CAPSULE BY MOUTH DAILY AFTER SUPPER (Patient taking differently: Take 0.4 mg by mouth daily after supper. )   No facility-administered encounter medications on file as of 03/05/2020.     Objective:  Lab Results  Component Value Date   CREATININE 3.20 (H) 03/04/2020   BUN 55 (H) 03/04/2020   NA 145 03/04/2020   K 5.1 03/04/2020   CL 116 (H) 03/04/2020   CO2 19 (L) 03/04/2020   Lab Results  Component Value Date   HGBA1C 5.5 02/20/2020   HGBA1C 6.0 (A) 11/16/2019   HGBA1C 6.1 (A) 08/03/2019   Lab Results  Component Value Date   LDLCALC 95 03/03/2016   CREATININE 3.20 (H) 03/04/2020     Goals Addressed  This Visit's Progress     Patient Stated   . "I told the social worker I have a lot of stray cats" (pt-stated)       CARE PLAN ENTRY (see longitudinal plan of care for additional care plan information)   Current Barriers:  . Chronic Disease Management support, education, and care coordination needs related to Atrial Fibrillation, HTN, DMII, and CKD Stage 3.  Case Manager Clinical Goal(s):  [name] Over the next 30 days, patient will work with BSW to address needs related to ridding property of stray cats in patient with Atrial Fibrillation, HTN,  DMII, and CKD Stage 3.  Interventions:  . Collaborated with BSW to initiate plan of care to address needs related to ridding property of stray cats in patient with Atrial Fibrillation, HTN, DMII, and CKD Stage 3.  Patient Self Care Activities:  . Patient verbalizes understanding of plan to work with BSW to explore resources to rid property of stray cats.  Initial goal documentation         [name] was given information about Chronic Care Management services today including:  1. CCM service includes personalized support from designated clinical staff supervised by his physician, including individualized plan of care and coordination with other care providers 2. 24/7 contact phone numbers for assistance for urgent and routine care needs. 3. Service will only be billed when office clinical staff spend 20 minutes or more in a month to coordinate care. 4. Only one practitioner may furnish and bill the service in a calendar month. 5. The patient may stop CCM services at any time (effective at the end of the month) by phone call to the office staff. 6. The patient will be responsible for cost sharing (co-pay) of up to 20% of the service fee (after annual deductible is met).  Patient agreed to services and verbal consent obtained.   Plan:   The care management team will reach out to the patient again over the next 7 days.   Kelli Churn RN, CCM, [facility] RN Care Manager [phone]

## 2020-03-05 NOTE — Patient Instructions (Addendum)
Visit Information   Goals Addressed            This Visit's Progress     Patient Stated   . "I told the social worker I have a lot of stray cats" (pt-stated)       CARE PLAN ENTRY (see longitudinal plan of care for additional care plan information)   Current Barriers:  . Chronic Disease Management support, education, and care coordination needs related to Atrial Fibrillation, DMII, and CKD Stage 3  Case Manager Clinical Goal(s):  [name] Over the next 30 days, patient will work with BSW to address needs related to ridding property of stray cats in patient with Atrial Fibrillation, DMII, and CKD Stage 3  Interventions:  . Collaborated with BSW to initiate plan of care to address needs related to  ridding property of stray cats  in patient with Atrial Fibrillation, DMII, and CKD Stage 3  Patient Self Care Activities:  . Patient verbalizes understanding of plan to work with BSW for resources to rid property of stray cats.  Initial goal documentation        Mr. Haro was given information about Chronic Care Management services today including:  1. CCM service includes personalized support from designated clinical staff supervised by his physician, including individualized plan of care and coordination with other care providers 2. 24/7 contact phone numbers for assistance for urgent and routine care needs. 3. Service will only be billed when office clinical staff spend 20 minutes or more in a month to coordinate care. 4. Only one practitioner may furnish and bill the service in a calendar month. 5. The patient may stop CCM services at any time (effective at the end of the month) by phone call to the office staff. 6. The patient will be responsible for cost sharing (co-pay) of up to 20% of the service fee (after annual deductible is met).  Patient agreed to services and verbal consent obtained.   The patient verbalized understanding of instructions provided today and declined a print  copy of patient instruction materials.   The care management team will reach out to the patient again over the next 7 days.   Kelli Churn RN, CCM, [facility] RN Care Manager [phone]

## 2020-03-06 ENCOUNTER — Ambulatory Visit: Payer: Medicare Other | Admitting: *Deleted

## 2020-03-06 DIAGNOSIS — N183 Chronic kidney disease, stage 3 unspecified: Secondary | ICD-10-CM

## 2020-03-06 NOTE — Chronic Care Management (AMB) (Signed)
  Chronic Care Management   Note  03/06/2020 Name: Derrick Hendrix MRN: [id number] DOB: 29-Jan-1941   Successful outreach to patient to introduce role of RNCM. Advised patient this RNCM will introduce herself in person when he comes to his clinic appointment tomorrow at 9:15 am and arrange for follow up telephone assessmnet for chronic care management.   Kelli Churn RN, CCM, [facility] RN Care Manager [phone]

## 2020-03-06 NOTE — Patient Instructions (Signed)
Visit Information  Goals Addressed            This Visit's Progress   . "I have a lot of stray cats around my house that I can't take care of" (pt-stated)       Current Barriers:  [name] Knowledge Barriers related to resources and support available to assist with stray cats on property.    Case Manager Clinical Goal(s):  [name] Over the next 30 days, patient will work with BSW to address needs related to  removing stray cats from property.  . Over the next 30 days, BSW will collaborate with RN Care Manager to address care management and care coordination needs  Interventions:  . Patient interviewed and appropriate assessments performed  . Collaborated with RN Care Manager and patient to establish an individualized plan of care  . Discussed plans with patient for ongoing care management follow up and provided patient with direct contact information for care management team . Collaboration with RN Case Manager  . Contacted the following rescue organizations regarding possible assistance:  Advertising account executive, [employer], [employer], Herbalist, Engineer, agricultural and [place] [employer].  Left message with Orlin Hilding with the [facility].  None of the agencies contacted today will assist with permanent removal of cats from the property.  Programs assist by trapping the cats, spay or neuter surgery, then return cats to the property.    Patient Self Care Activities:  . Self administers medications as prescribed . Attends all scheduled provider appointments . Calls provider office for new concerns or questions  Initial goal documentation        Mr. Schweikert was given information about Care Management services today including:  1. Care Management services include personalized support from designated clinical staff supervised by his physician, including individualized plan of care and coordination with other care providers 2. 24/7 contact  phone numbers for assistance for urgent and routine care needs. 3. The patient may stop CCM services at any time (effective at the end of the month) by phone call to the office staff.  Patient agreed to services and verbal consent obtained.   Patient verbalizes understanding of instructions provided today.   The care management team will reach out to the patient again over the next 14 days.     Ronn Melena, [place] Coordination Social Worker [place] [phone]

## 2020-03-06 NOTE — Progress Notes (Signed)
Internal Medicine Clinic Resident  I have personally reviewed this encounter including the documentation in this note and/or discussed this patient with the care management provider. I will address any urgent items identified by the care management provider and will communicate my actions to the patient's PCP. I have reviewed the patient's CCM visit with my supervising attending.  Kylyn Mcdade, MD 03/06/2020    

## 2020-03-07 ENCOUNTER — Ambulatory Visit: Payer: Medicare Other | Admitting: *Deleted

## 2020-03-07 ENCOUNTER — Ambulatory Visit (INDEPENDENT_AMBULATORY_CARE_PROVIDER_SITE_OTHER): Payer: Medicare Other | Admitting: Internal Medicine

## 2020-03-07 ENCOUNTER — Ambulatory Visit: Payer: Self-pay

## 2020-03-07 ENCOUNTER — Other Ambulatory Visit: Payer: Self-pay

## 2020-03-07 VITALS — BP 107/68 | HR 89 | Temp 98.4°F | Wt 202.4 lb

## 2020-03-07 DIAGNOSIS — N184 Chronic kidney disease, stage 4 (severe): Secondary | ICD-10-CM

## 2020-03-07 DIAGNOSIS — E875 Hyperkalemia: Secondary | ICD-10-CM | POA: Diagnosis not present

## 2020-03-07 DIAGNOSIS — E2749 Other adrenocortical insufficiency: Secondary | ICD-10-CM | POA: Diagnosis not present

## 2020-03-07 DIAGNOSIS — I63532 Cerebral infarction due to unspecified occlusion or stenosis of left posterior cerebral artery: Secondary | ICD-10-CM | POA: Diagnosis not present

## 2020-03-07 DIAGNOSIS — I10 Essential (primary) hypertension: Secondary | ICD-10-CM

## 2020-03-07 DIAGNOSIS — N183 Chronic kidney disease, stage 3 unspecified: Secondary | ICD-10-CM

## 2020-03-07 NOTE — Progress Notes (Signed)
Internal Medicine Clinic Attending  CCM services provided by the care management provider and their documentation were reviewed with Dr. Berline Lopes.  We reviewed the pertinent findings, urgent action items addressed by the resident and non-urgent items to be addressed by the PCP.  I agree with the assessment, diagnosis, and plan of care documented in the CCM and resident's note.  Oda Kilts, MD 03/07/2020

## 2020-03-07 NOTE — Progress Notes (Signed)
   CC: hospital follow-up  HPI:[name] is a 79 y.o. male who presents for evaluation of hospital follow-up. Please see individual problem based A/P for details.  Past Medical History:  Diagnosis Date  . Abscess of foot without toes, left 08/28/2017  . Chronic kidney disease (CKD), stage III (moderate)   . Constipation 11/07/2014  . Daily headache    "lately" (04/17/2016)  . Depression   . Dizziness 03/07/2016  . Headache 06/26/2014  . Heart murmur   . High cholesterol   . Hyperkalemia 08/2014  . Hyperkalemia 10/20/2014  . Hyperkalemia 10/05/2018  . Hyperlipemia 11/11/2016  . Hypertension   . Neuropathic pain 02/06/2015  . Osteomyelitis ([place])   . PAD (peripheral artery disease) ([place])   . Paronychia of great toe, right   . Preop cardiovascular exam   . Right foot pain 02/09/2017  . Stable angina ([place]) 06/27/2014  . Stroke ([place]) 03/2016   hx of PAD; j"ust lots of headaches since" (04/17/2016)  . Vitamin B12 deficiency 06/27/2014  . Weight loss, unintentional 06/27/2014   Review of Systems:  ROS negative except as per HPI.  Physical Exam: Vitals:   03/07/20 0828  BP: 107/68  Pulse: 89  Temp: 98.4 F (36.9 C)  TempSrc: Oral  SpO2: 97%  Weight: 202 lb 6.4 oz (91.8 kg)   Filed Weights   03/07/20 0828  Weight: 202 lb 6.4 oz (91.8 kg)   General: A/O x4, in no acute distress, afebrile, nondiaphoretic HEENT: PEERL, EMO intact Cardio: RRR, no mrg's  Pulmonary: CTA bilaterally, no wheezing or crackles  Abdomen: Bowel sounds normal, soft, nontender  MSK: BLE nontender, nonedematous Neuro: Alert, persistent hemiballismus on the left, improved today from prior encounters. Psych: Appropriate affect, not depressed in appearance, engages well  Assessment & Plan:   See Encounters Tab for problem based charting.  Patient discussed with Dr. Daryll Drown

## 2020-03-07 NOTE — Chronic Care Management (AMB) (Signed)
  Care Management   Follow Up Note   03/07/2020 Name: Derrick Hendrix MRN: [id number] DOB: 10-Sep-1941  Referred by: [name], MD Reason for referral : Care Coordination [facility] Resources )   Derrick Hendrix is a 79 y.o. year old male who is a primary care patient of [name], [name], MD. The care management team was consulted for assistance with care management and care coordination needs.    Review of patient status, including review of consultants reports, relevant laboratory and other test results, and collaboration with appropriate care team members and the patient's provider was performed as part of comprehensive patient evaluation and provision of chronic care management services.    SDOH (Social Determinants of Health) assessments performed: No See Care Plan activities for detailed interventions related to [facility])     Advanced Directives: See Care Plan and Vynca application for related entries.   Goals Addressed            This Visit's Progress   . "I have a lot of stray cats around my house that I can't take care of" (pt-stated)       Current Barriers:  [name] Knowledge Barriers related to resources and support available to assist with stray cats on property.    Case Manager Clinical Goal(s):  [name] Over the next 30 days, patient will work with BSW to address needs related to removing stray cats from property.  . Over the next 30 days, BSW will collaborate with RN Care Manager to address care management and care coordination needs  Interventions:  . Collaborated with Vanessa Ralphs from [facility].  She can assist with trapping, spaying and neutering cats on patient's property at no cost to patient.  Cats will have to be returned to property.  Have contacted multiple agencies and all report that cats will have to be returned to property.  No agencies will permanently remove the cats. . Called patient to discuss this option.  Patient was hesitant due to cats having to be  returned to his property.  Informed patient of lack of resource to permanently remove cats.   Patient consented to his contact information being provided to Ms. Hollice Espy . Provided patient's contact information to Ms. Hollice Espy.  Stated that she will call him on 03/08/20.   Patient Self Care Activities:  . Self administers medications as prescribed . Attends all scheduled provider appointments . Calls provider office for new concerns or questions  Please see past updates related to this goal by clicking on the "Past Updates" button in the selected goal          Telephone follow up appointment with care management team member scheduled for:03/19/20.     Ronn Melena, [place] Coordination Social Worker [place] [phone]

## 2020-03-07 NOTE — Patient Instructions (Signed)
Visit Information  Goals Addressed            This Visit's Progress   . "I have a lot of stray cats around my house that I can't take care of" (pt-stated)       Current Barriers:  [name] Knowledge Barriers related to resources and support available to assist with stray cats on property.    Case Manager Clinical Goal(s):  [name] Over the next 30 days, patient will work with BSW to address needs related to removing stray cats from property.  . Over the next 30 days, BSW will collaborate with RN Care Manager to address care management and care coordination needs  Interventions:  . Collaborated with Vanessa Ralphs from [facility].  She can assist with trapping, spaying and neutering cats on patient's property at no cost to patient.  Cats will have to be returned to property.  Have contacted multiple agencies and all report that cats will have to be returned to property.  No agencies will permanently remove the cats. . Called patient to discuss this option.  Patient was hesitant due to cats having to be returned to his property.  Informed patient of lack of resource to permanently remove cats.   Patient consented to his contact information being provided to Ms. Hollice Espy . Provided patient's contact information to Ms. Hollice Espy.  Stated that she will call him on 03/08/20.   Patient Self Care Activities:  . Self administers medications as prescribed . Attends all scheduled provider appointments . Calls provider office for new concerns or questions  Please see past updates related to this goal by clicking on the "Past Updates" button in the selected goal         Patient verbalizes understanding of instructions provided today.   Telephone follow up appointment with care management team member scheduled for:03/19/20      Kateline Kinkade [place], [place] Coordination Social Worker [place] [phone]

## 2020-03-07 NOTE — Patient Instructions (Addendum)
Visit Information It was nice meeting you today. I will call you in the next 1-3 days if there are changes to your medications based on your lab results of today and we will discuss if there are any changes to your pill pack.  Goals Addressed            This Visit's Progress     Patient Stated   . " I don't like it when the pharmacy changes my pill packs w/o telling me.; it makes my blood pressure go up. Also, I'm trying to follow the diet they want me to follow but I don't know what to eat. I eat a lot of soup but I know it has a lot of salt." (pt-stated)       CARE PLAN ENTRY (see longitudinal plan of care for additional care plan information)  Current Barriers:  . Chronic Disease Management support and education needs related to HTN and CKD  and hyporeninemic hypoaldosteronism  Nurse Case Manager Clinical Goal(s):  [name] Over the next 30 days, patient will verbalize basic understanding of HTN , CKD and  hyporeninemic hypoaldosteronism disease process and self health management plan as evidenced by voicing understanding of any medication changes , taking medications as prescribed and following recommended diet  Interventions:  . Provided education to patient re: diet recommendations . Reviewed medications with patient and discussed any changes made to medication regimen and reason for changes . Collaborated with providers and patient's dispensing pharmacy regarding medication changes to ensure pill packs are correct  Patient Self Care Activities:  . Self administers medications as prescribed . Attends all scheduled provider appointments . Calls provider office for new concerns or questions . Patient voices understanding of any medication changes and changes to pill packs . Patient voices understanding of diet recommendations . Unable to perform IADLs independently- uses [employer] for transportation  Initial goal documentation        The patient verbalized understanding of  instructions provided today and declined a print copy of patient instruction materials.   The care management team will reach out to the patient again over the next 7 days.   Kelli Churn RN, CCM, [facility] RN Care Manager [phone]

## 2020-03-07 NOTE — Assessment & Plan Note (Signed)
Admitted with hyperkalemia. We will need to consider a dose change based on labs from today. See CKD IV from same day for more details.

## 2020-03-07 NOTE — Chronic Care Management (AMB) (Signed)
Chronic Care Management   Initial Visit Note  03/07/2020 Name: Derrick Hendrix MRN: [id number] DOB: [birth year]  Referred by: Derrick Rosenthal, MD Reason for referral : Care Coordination (T2 DM, CKD, A Fib)   Derrick Hendrix is a 79 y.o. year old male who is a primary care patient of [name], [name], MD. The CCM team was consulted for assistance with chronic disease management and care coordination needs related to Atrial Fibrillation, DMII and hyporeninemic hypoaldosteronism  Review of patient status, including review of consultants reports, relevant laboratory and other test results, and collaboration with appropriate care team members and the patient's provider was performed as part of comprehensive patient evaluation and provision of chronic care management services.    SDOH (Social Determinants of Health) assessments performed: Yes See Care Plan activities for detailed interventions related to SDOH  SDOH Interventions     Most Recent Value  SDOH Interventions  SDOH Interventions for the Following Domains  Transportation  Transportation Interventions  Other (Comment) [no intervention needed- states he uses [facility]]       Medications: Outpatient Encounter Medications as of 03/07/2020  Medication Sig  . acetaminophen (TYLENOL) 500 MG tablet Take 2 tablets (1,000 mg total) by mouth every 6 (six) hours as needed for mild pain (or Fever >/= 101).  [name] amLODipine (NORVASC) 10 MG tablet TAKE ONE TABLET BY MOUTH DAILY (Patient taking differently: Take 10 mg by mouth daily. )  . atorvastatin (LIPITOR) 80 MG tablet TAKE ONE TABLET BY MOUTH DAILY (Patient taking differently: Take 80 mg by mouth daily at 6 PM. )  . clonazePAM (KLONOPIN) 0.25 MG disintegrating tablet DISSOLVE ONE TABLET UNDER THE TONGUE THREE TIMES A DAY (Patient taking differently: Take 0.25 mg by mouth 3 (three) times daily. )  . docusate sodium (COLACE) 100 MG capsule Take 1 capsule (100 mg total) by mouth daily.  [name] ELIQUIS 2.5 MG TABS  tablet TAKE ONE TABLET BY MOUTH TWICE A DAY  APPOINTMENT NEEDED INTERNAL MEDICINE [phone]  . finasteride (PROSCAR) 5 MG tablet TAKE ONE TABLET BY MOUTH DAILY (Patient taking differently: Take 5 mg by mouth daily. )  . fludrocortisone (FLORINEF) 0.1 MG tablet TAKE ONE TABLET BY MOUTH TWICE A DAY (Patient taking differently: Take 0.1 mg by mouth 2 (two) times daily. )  . furosemide (LASIX) 40 MG tablet Take 0.5 tablets (20 mg total) by mouth daily.  . hydrALAZINE (APRESOLINE) 10 MG tablet Take 1 tablet (10 mg total) by mouth 3 (three) times daily.  [name] ipratropium (ATROVENT) 0.06 % nasal spray Place 2 sprays into the nose 3 (three) times daily. (Patient taking differently: Place 2 sprays into the nose daily as needed for rhinitis. )  . nitroGLYCERIN (NITROSTAT) 0.4 MG SL tablet Place 1 tablet (0.4 mg total) under the tongue every 5 (five) minutes x 3 doses as needed for chest pain.  . tamsulosin (FLOMAX) 0.4 MG CAPS capsule TAKE ONE CAPSULE BY MOUTH DAILY AFTER SUPPER (Patient taking differently: Take 0.4 mg by mouth daily after supper. )   No facility-administered encounter medications on file as of 03/07/2020.     Objective:  BP Readings from Last 3 Encounters:  03/07/20 107/68  03/04/20 (!) 145/83  02/20/20 (!) 151/65   Lab Results  Component Value Date   HGBA1C 5.5 02/20/2020   HGBA1C 6.0 (A) 11/16/2019   HGBA1C 6.1 (A) 08/03/2019   Lab Results  Component Value Date   LDLCALC 95 03/03/2016   CREATININE 3.20 (H) 03/04/2020   Goals Addressed  This Visit's Progress     Patient Stated   . " I don't like it when the pharmacy changes my pill packs w/o telling me.; it makes my blood pressure go up. Also, I'm trying to follow the diet they want me to follow but I don't know what to eat. I eat a lot of soup but I know it has a lot of salt." (pt-stated)       CARE PLAN ENTRY (see longitudinal plan of care for additional care plan information)  Current Barriers:   . Chronic Disease Management support and education needs related to HTN and CKD  and hyporeninemic hypoaldosteronism  Nurse Case Manager Clinical Goal(s):  [name] Over the next 30 days, patient will verbalize basic understanding of HTN , CKD and  hyporeninemic hypoaldosteronism disease process and self health management plan as evidenced by voicing understanding of any medication changes , taking medications as prescribed and following recommended diet  Interventions:  . Provided education to patient re: diet recommendations- mailed written information on low sodium heart healthy diet to patient's home address . Reviewed medications with patient and discussed any changes made to medication regimen and reason for changes . Collaborated with providers and patient's dispensing pharmacy regarding medication changes to ensure pill packs are correct  Patient Self Care Activities:  . Self administers medications as prescribed . Attends all scheduled provider appointments . Calls provider office for new concerns or questions . Patient voices understanding of any medication changes and changes to pill packs . Patient voices understanding of diet recommendations . Unable to perform IADLs independently- uses [employer] for transportation  Initial goal documentation        Derrick Hendrix was given information about Chronic Care Management services today including:  1. CCM service includes personalized support from designated clinical staff supervised by his physician, including individualized plan of care and coordination with other care providers 2. 24/7 contact phone numbers for assistance for urgent and routine care needs. 3. Service will only be billed when office clinical staff spend 20 minutes or more in a month to coordinate care. 4. Only one practitioner may furnish and bill the service in a calendar month. 5. The patient may stop CCM services at any time (effective at the end of the month) by phone call  to the office staff. 6. The patient will be responsible for cost sharing (co-pay) of up to 20% of the service fee (after annual deductible is met).  Patient agreed to services and verbal consent obtained.   Plan:   The care management team will reach out to the patient again over the next 7 days.   Kelli Churn RN, CCM, [facility] RN Care Manager [phone]

## 2020-03-07 NOTE — Assessment & Plan Note (Signed)
AKI with hyperkalemia: Patient admitted on 03/03/20 for hyperkalemia and acute on chronic renal failure.  Etiology was uncertain but felt to possibly be due to the patient's excess medication use and dehydration.  He was treated by holding his Lasix, administration of IV fluids and Lokelma for developing hyperkalemia.  His hyperkalemia improved to near baseline and he was advised to decrease his Lasix dose to 20 mg daily.  Additionally there is some concern of the patient's hyporeninemic hyperaldosteronism had played a role as he has had multiple dose titrations of his Florinef 0.1 mg.  In the past he had been hypokalemic with higher doses. Today the patient states he is compliant with medications which we went over. He does, however, continue to take Lasix at 20 mg twice daily.  I advised him that the head requested he decrease this to 20 mg daily.  He was started on hydralazine 10 mg 3 times daily for hypertension which he continues to take as prescribed.  Plan: BMP today Consider increasing his dose of Florinef  Likely decreasing Lasix to 20 mg daily today based on BMP results

## 2020-03-07 NOTE — Assessment & Plan Note (Signed)
CKD IV: Patient's baseline serum creatinine is difficult to determine but appear to be approximately 2 or above prior to his presentation on the 18th with acute renal failure.  It improved from 4.17-3.20 by discharge.  He is past due for referral to nephrology which I replaced today  Plan:  BMP today Referral to nephrology

## 2020-03-07 NOTE — Patient Instructions (Signed)
FOLLOW-UP INSTRUCTIONS When: 2-4 wks For: With your primary care doctor What to bring: All of your medications  I have not made any changes to your medications today.   Today we discussed your recent hospital admission. Today, we will obtain a lab and I will notify you if we recommend any medication changes. For now, please continue taking the medications as you described to me today.  Thank you for your visit to the [facility] today. If you have any questions or concerns please call us at [phone].

## 2020-03-08 LAB — BMP8+ANION GAP
Anion Gap: 16 mmol/L (ref 10.0–18.0)
BUN/Creatinine Ratio: 15 (ref 10–24)
BUN: 55 mg/dL — ABNORMAL HIGH (ref 8–27)
CO2: 16 mmol/L — ABNORMAL LOW (ref 20–29)
Calcium: 8.6 mg/dL (ref 8.6–10.2)
Chloride: 110 mmol/L — ABNORMAL HIGH (ref 96–106)
Creatinine, Ser: 3.75 mg/dL — ABNORMAL HIGH (ref 0.76–1.27)
GFR calc Af Amer: 16 mL/min/{1.73_m2} — ABNORMAL LOW (ref >59)
GFR calc non Af Amer: 14 mL/min/{1.73_m2} — ABNORMAL LOW (ref >59)
Glucose: 91 mg/dL (ref 65–99)
Potassium: 5.5 mmol/L — ABNORMAL HIGH (ref 3.5–5.2)
Sodium: 142 mmol/L (ref 134–144)

## 2020-03-08 NOTE — Progress Notes (Signed)
Internal Medicine Clinic Attending  Case discussed with Dr. Harbrecht  soon after the resident saw the patient.  We reviewed the resident's history and exam and pertinent patient test results.  I agree with the assessment, diagnosis, and plan of care documented in the resident's note.  

## 2020-03-08 NOTE — Progress Notes (Signed)
Internal Medicine Clinic Resident   I have personally reviewed this encounter including the documentation in this note and/or discussed this patient with the care management provider. I will address any urgent items identified by the care management provider and will communicate my actions to the patient's PCP. I have reviewed the patient's CCM visit with my supervising attending.  Almetta Liddicoat, MD 03/08/2020    

## 2020-03-08 NOTE — Progress Notes (Signed)
Internal Medicine Clinic Resident   I have personally reviewed this encounter including the documentation in this note and/or discussed this patient with the care management provider. I will address any urgent items identified by the care management provider and will communicate my actions to the patient's PCP. I have reviewed the patient's CCM visit with my supervising attending.  Jakyia Gaccione, MD 03/08/2020    

## 2020-03-09 ENCOUNTER — Ambulatory Visit: Payer: Self-pay | Admitting: *Deleted

## 2020-03-09 ENCOUNTER — Telehealth: Payer: Medicare Other

## 2020-03-09 DIAGNOSIS — N184 Chronic kidney disease, stage 4 (severe): Secondary | ICD-10-CM

## 2020-03-09 DIAGNOSIS — I10 Essential (primary) hypertension: Secondary | ICD-10-CM

## 2020-03-09 DIAGNOSIS — E2749 Other adrenocortical insufficiency: Secondary | ICD-10-CM

## 2020-03-09 DIAGNOSIS — I480 Paroxysmal atrial fibrillation: Secondary | ICD-10-CM

## 2020-03-09 NOTE — Progress Notes (Signed)
Internal Medicine Clinic Resident   I have personally reviewed this encounter including the documentation in this note and/or discussed this patient with the care management provider. I will address any urgent items identified by the care management provider and will communicate my actions to the patient's PCP. I have reviewed the patient's CCM visit with my supervising attending.  Kathi Ludwig, MD 03/09/2020

## 2020-03-09 NOTE — Chronic Care Management (AMB) (Signed)
  Care Management   Outreach Note  03/09/2020 Name: Derrick Hendrix. MRN: [id number] DOB: 12-18-1940  Referred by: [name], MD Reason for referral : Chronic Care Management (CKD, A Fib)   An unsuccessful telephone outreach was attempted today. The patient was referred to the case management team for assistance with care management and care coordination. Unable to leave voice mail as recording stated voice mail box has not been set up.   Follow Up Plan: The care management team will reach out to the patient again over the next 7 days.   Kelli Churn RN, CCM, [facility] RN Care Manager [phone]

## 2020-03-09 NOTE — Chronic Care Management (AMB) (Signed)
  Chronic Care Management   Note  03/09/2020 Name: Derrick Hendrix. MRN: [id number] DOB: 05/12/1941   Mailed diet information to patient's home address.  Follow up plan: Telephone follow up appointment with care management team member scheduled for: 03/09/20 at 10:00 am  Kelli Churn RN, CCM, [facility] RN Care Manager [phone]

## 2020-03-12 ENCOUNTER — Ambulatory Visit: Payer: Medicare Other | Admitting: *Deleted

## 2020-03-12 ENCOUNTER — Other Ambulatory Visit (HOSPITAL_COMMUNITY): Payer: Self-pay | Admitting: Nurse Practitioner

## 2020-03-12 DIAGNOSIS — E2749 Other adrenocortical insufficiency: Secondary | ICD-10-CM

## 2020-03-12 DIAGNOSIS — I10 Essential (primary) hypertension: Secondary | ICD-10-CM

## 2020-03-12 DIAGNOSIS — N184 Chronic kidney disease, stage 4 (severe): Secondary | ICD-10-CM

## 2020-03-12 NOTE — Chronic Care Management (AMB) (Signed)
Chronic Care Management   Follow Up Note   03/12/2020 Name: Derrick Hendrix. MRN: [id number] DOB: [birth year]  Referred by: [name], MD Reason for referral : Chronic Care Management (T2 DM, A fib, HTN, CKD)   Derrick Hendrix. is a 79 y.o. year old male who is a primary care patient of [name], [name], MD. The CCM team was consulted for assistance with chronic disease management and care coordination needs.    Review of patient status, including review of consultants reports, relevant laboratory and other test results, and collaboration with appropriate care team members and the patient's provider was performed as part of comprehensive patient evaluation and provision of chronic care management services.    SDOH (Social Determinants of Health) assessments performed: No See Care Plan activities for detailed interventions related to [facility])     Outpatient Encounter Medications as of 03/12/2020  Medication Sig  . acetaminophen (TYLENOL) 500 MG tablet Take 2 tablets (1,000 mg total) by mouth every 6 (six) hours as needed for mild pain (or Fever >/= 101).  [name] amLODipine (NORVASC) 10 MG tablet TAKE ONE TABLET BY MOUTH DAILY (Patient taking differently: Take 10 mg by mouth daily. )  . atorvastatin (LIPITOR) 80 MG tablet TAKE ONE TABLET BY MOUTH DAILY (Patient taking differently: Take 80 mg by mouth daily at 6 PM. )  . clonazePAM (KLONOPIN) 0.25 MG disintegrating tablet DISSOLVE ONE TABLET UNDER THE TONGUE THREE TIMES A DAY (Patient taking differently: Take 0.25 mg by mouth 3 (three) times daily. )  . docusate sodium (COLACE) 100 MG capsule Take 1 capsule (100 mg total) by mouth daily.  [name] ELIQUIS 2.5 MG TABS tablet TAKE ONE TABLET BY MOUTH TWICE A DAY  APPOINTMENT NEEDED INTERNAL MEDICINE [phone]  . finasteride (PROSCAR) 5 MG tablet TAKE ONE TABLET BY MOUTH DAILY (Patient taking differently: Take 5 mg by mouth daily. )  . fludrocortisone (FLORINEF) 0.1 MG tablet TAKE ONE TABLET BY MOUTH  TWICE A DAY (Patient taking differently: Take 0.1 mg by mouth 2 (two) times daily. )  . furosemide (LASIX) 40 MG tablet Take 0.5 tablets (20 mg total) by mouth daily.  . hydrALAZINE (APRESOLINE) 10 MG tablet Take 1 tablet (10 mg total) by mouth 3 (three) times daily.  [name] ipratropium (ATROVENT) 0.06 % nasal spray Place 2 sprays into the nose 3 (three) times daily. (Patient taking differently: Place 2 sprays into the nose daily as needed for rhinitis. )  . nitroGLYCERIN (NITROSTAT) 0.4 MG SL tablet Place 1 tablet (0.4 mg total) under the tongue every 5 (five) minutes x 3 doses as needed for chest pain.  . tamsulosin (FLOMAX) 0.4 MG CAPS capsule TAKE ONE CAPSULE BY MOUTH DAILY AFTER SUPPER (Patient taking differently: Take 0.4 mg by mouth daily after supper. )   No facility-administered encounter medications on file as of 03/12/2020.     Objective:   Goals Addressed            This Visit's Progress     Patient Stated   . " I don't like it when the pharmacy changes my pill packs w/o telling me.; it makes my blood pressure go up. Also, I'm trying to follow the diet they want me to follow but I don't know what to eat. I eat a lot of soup but I know it has a lot of salt." (pt-stated)       CARE PLAN ENTRY (see longitudinal plan of care for additional care plan information)  Current Barriers:  .  Chronic Disease Management support and education needs related to HTN and CKD  and hyporeninemic hypoaldosteronism  Nurse Case Manager Clinical Goal(s):  [name] Over the next 30 days, patient will verbalize basic understanding of HTN , CKD and  hyporeninemic hypoaldosteronism disease process and self health management plan as evidenced by voicing understanding of any medication changes , taking medications as prescribed and following recommended diet  Interventions:  . Provided education to patient re: diet recommendations- mailed [employer] educational articles on Kidney Disease Diet and Low Potassium Diet to  patient's home address- called patient on 3/29 to tell him he would be getting diet information in the mail and to call this RNCM for any questions once he has reviewed the material . Reviewed medications with patient and discussed any changes made to medication regimen and reason for changes . Collaborated with providers and patient's dispensing pharmacy regarding medication changes to ensure pill packs are correct  Patient Self Care Activities:  . Self administers medications as prescribed . Attends all scheduled provider appointments . Calls provider office for new concerns or questions . Patient voices understanding of any medication changes and changes to pill packs . Patient voices understanding of diet recommendations . Unable to perform IADLs independently- uses [employer] for transportation  Please see past updates related to this goal by clicking on the "Past Updates" button in the selected goal      . COMPLETED: "I told the social worker I have a lot of stray cats" (pt-stated)       [place] (see longitudinal plan of care for additional care plan information)   Current Barriers:  . Chronic Disease Management support, education, and care coordination needs related to Atrial Fibrillation, HTN, DMII, and CKD Stage 3.  Case Manager Clinical Goal(s):  [name] Over the next 30 days, patient will work with BSW to address needs related to ridding property of stray cats in patient with Atrial Fibrillation, HTN, DMII, and CKD Stage 3.  Interventions:  . Asked patient about the stray cat situation and he said a lady came over the weekend and picked up the cats to be spayed and then returned to his property  Patient Self Care Activities:  . Patient verbalizes understanding of plan to work with BSW to explore resources to rid property of stray cats.  Please see past updates related to this goal by clicking on the "Past Updates" button in the selected goal         Plan:   The care  management team will reach out to the patient again over the next 30 days.   Kelli Churn RN, CCM, [facility] RN Care Manager [phone]

## 2020-03-12 NOTE — Progress Notes (Signed)
Internal Medicine Clinic Attending  CCM services provided by the care management provider and their documentation were discussed with Dr. Berline Lopes. We reviewed the pertinent findings, urgent action items addressed by the resident and non-urgent items to be addressed by the PCP.  I agree with the assessment, diagnosis, and plan of care documented in the CCM and resident's note.  Larey Dresser, MD 03/12/2020

## 2020-03-12 NOTE — Progress Notes (Signed)
Internal Medicine Clinic Resident   I have personally reviewed this encounter including the documentation in this note and/or discussed this patient with the care management provider. I will address any urgent items identified by the care management provider and will communicate my actions to the patient's PCP. I have reviewed the patient's CCM visit with my supervising attending.  Neva Seat, MD 03/12/2020

## 2020-03-12 NOTE — Patient Instructions (Signed)
Visit Information It was nice talking with you today. Please call me if you have questions about the dietary information I mailed to you. I will call you again in about a month.  Goals Addressed            This Visit's Progress     Patient Stated   . " I don't like it when the pharmacy changes my pill packs w/o telling me.; it makes my blood pressure go up. Also, I'm trying to follow the diet they want me to follow but I don't know what to eat. I eat a lot of soup but I know it has a lot of salt." (pt-stated)       CARE PLAN ENTRY (see longitudinal plan of care for additional care plan information)  Current Barriers:  . Chronic Disease Management support and education needs related to HTN and CKD  and hyporeninemic hypoaldosteronism  Nurse Case Manager Clinical Goal(s):  [name] Over the next 30 days, patient will verbalize basic understanding of HTN , CKD and  hyporeninemic hypoaldosteronism disease process and self health management plan as evidenced by voicing understanding of any medication changes , taking medications as prescribed and following recommended diet  Interventions:  . Provided education to patient re: diet recommendations- mailed Emmi educational articles on Kidney Disease Diet and Low Potassium Diet to patient's home address- called patient on 3/29 to tell him he would be getting diet information in the mail and to call this RNCM for any questions once he has reviewed the material . Reviewed medications with patient and discussed any changes made to medication regimen and reason for changes . Collaborated with providers and patient's dispensing pharmacy regarding medication changes to ensure pill packs are correct  Patient Self Care Activities:  . Self administers medications as prescribed . Attends all scheduled provider appointments . Calls provider office for new concerns or questions . Patient voices understanding of any medication changes and changes to pill  packs . Patient voices understanding of diet recommendations . Unable to perform IADLs independently- uses [facility] for transportation  Please see past updates related to this goal by clicking on the "Past Updates" button in the selected goal      . COMPLETED: "I told the social worker I have a lot of stray cats" (pt-stated)       [place] (see longitudinal plan of care for additional care plan information)   Current Barriers:  . Chronic Disease Management support, education, and care coordination needs related to Atrial Fibrillation, HTN, DMII, and CKD Stage 3.  Case Manager Clinical Goal(s):  [name] Over the next 30 days, patient will work with BSW to address needs related to ridding property of stray cats in patient with Atrial Fibrillation, HTN, DMII, and CKD Stage 3.  Interventions:  . Asked patient about the stray cat situation and he said a lady came over the weekend and picked up the cats to be spayed and then returned to his property  Patient Self Care Activities:  . Patient verbalizes understanding of plan to work with BSW to explore resources to rid property of stray cats.  Please see past updates related to this goal by clicking on the "Past Updates" button in the selected goal        The patient verbalized understanding of instructions provided today and declined a print copy of patient instruction materials.   The care management team will reach out to the patient again over the next 30 days.  Kelli Churn RN, CCM, [facility] RN Care Manager [phone]

## 2020-03-13 NOTE — Progress Notes (Signed)
Internal Medicine Clinic Attending  CCM services provided by the care management provider and their documentation were discussed with Dr. Trilby Drummer. We reviewed the pertinent findings, urgent action items addressed by the resident and non-urgent items to be addressed by the PCP.  I agree with the assessment, diagnosis, and plan of care documented in the CCM and resident's note.  Larey Dresser, MD 03/13/2020

## 2020-03-14 ENCOUNTER — Other Ambulatory Visit: Payer: Self-pay | Admitting: Internal Medicine

## 2020-03-14 DIAGNOSIS — I10 Essential (primary) hypertension: Secondary | ICD-10-CM

## 2020-03-15 ENCOUNTER — Telehealth: Payer: Self-pay | Admitting: Internal Medicine

## 2020-03-15 DIAGNOSIS — N184 Chronic kidney disease, stage 4 (severe): Secondary | ICD-10-CM

## 2020-03-15 DIAGNOSIS — E875 Hyperkalemia: Secondary | ICD-10-CM

## 2020-03-15 MED ORDER — FUROSEMIDE 20 MG PO TABS: 20.0000 mg | ORAL_TABLET | Freq: Every day | ORAL | 0 refills | Status: DC

## 2020-03-15 NOTE — Telephone Encounter (Signed)
I was able to finally reach Derrick Hendrix regarding his lab results. I informed him to keep his lasix dose at 20mg  daily and to come in to the clinic for a BMP and med check on April 7-9. He is agreeable to the plan.  This is in response to the increase in his sCr on his BMP to 3.75 from 3.2 at discharge. This is likely the combination of his CKD IV/V and excess diuretic use as he was taking the full 40mg  of lasix daily.

## 2020-03-19 ENCOUNTER — Ambulatory Visit: Payer: Medicare Other

## 2020-03-19 DIAGNOSIS — I10 Essential (primary) hypertension: Secondary | ICD-10-CM

## 2020-03-19 DIAGNOSIS — N184 Chronic kidney disease, stage 4 (severe): Secondary | ICD-10-CM

## 2020-03-19 NOTE — Chronic Care Management (AMB) (Signed)
  Care Management   Follow Up Note   03/19/2020 Name: Derrick Hendrix. MRN: [id number] DOB: 06/01/1941  Referred by: Derrick Rosenthal, MD Reason for referral : Care Coordination [facility] Resources)   Delray [name]. is a 79 y.o. year old male who is a primary care patient of [name], [name], MD. The care management team was consulted for assistance with care management and care coordination needs.    Review of patient status, including review of consultants reports, relevant laboratory and other test results, and collaboration with appropriate care team members and the patient's provider was performed as part of comprehensive patient evaluation and provision of chronic care management services.    SDOH (Social Determinants of Health) assessments performed: No See Care Plan activities for detailed interventions related to Derrick Hendrix)     Advanced Directives: See Care Plan and Vynca application for related entries.   Goals Addressed            This Visit's Progress   . "I have a lot of stray cats around my house that I can't take care of" (pt-stated)       Current Barriers:  Derrick Hendrix Knowledge Barriers related to resources and support available to assist with stray cats on property.    Case Manager Clinical Goal(s):  Derrick Hendrix Over the next 30 days, patient will work with BSW to address needs related to removing stray cats from property.  . Over the next 30 days, BSW will collaborate with RN Care Manager to address care management and care coordination needs  Interventions:   . Collaborated with Vanessa Ralphs from [facility] to get update on assistance with feral cats on patient's property.  Total of 8 cats have been spayed or neutered. Patient has 4 more cats on property that will be fixed over the next few weeks.   . Called patient for follow up.  Patient is very appreciative of assistance being provided.    . Patient Self Care Activities:  . Self administers medications as prescribed  . Attends all scheduled provider appointments . Calls provider office for new concerns or questions  Please see past updates related to this goal by clicking on the "Past Updates" button in the selected goal          Telephone follow up appointment with care management team member scheduled for:04/20/20.    Derrick Hendrix, [place] Coordination Social Worker [place] [phone]

## 2020-03-19 NOTE — Progress Notes (Signed)
Internal Medicine Clinic Resident   I have personally reviewed this encounter including the documentation in this note and/or discussed this patient with the care management provider. I will address any urgent items identified by the care management provider and will communicate my actions to the patient's PCP. I have reviewed the patient's CCM visit with my supervising attending.  Kaityln Kallstrom D Yussuf Sawyers, DO 03/19/2020    

## 2020-03-19 NOTE — Progress Notes (Signed)
Internal Medicine Clinic Attending  CCM services provided by the care management provider and their documentation were discussed with Dr. Bloomfield. We reviewed the pertinent findings, urgent action items addressed by the resident and non-urgent items to be addressed by the PCP.  I agree with the assessment, diagnosis, and plan of care documented in the CCM and resident's note.  Dorthy Magnussen, MD 03/19/2020  

## 2020-03-19 NOTE — Patient Instructions (Signed)
Visit Information  Goals Addressed            This Visit's Progress   . "I have a lot of stray cats around my house that I can't take care of" (pt-stated)       Current Barriers:  [name] Knowledge Barriers related to resources and support available to assist with stray cats on property.    Case Manager Clinical Goal(s):  [name] Over the next 30 days, patient will work with BSW to address needs related to removing stray cats from property.  . Over the next 30 days, BSW will collaborate with RN Care Manager to address care management and care coordination needs  Interventions:   . Collaborated with Vanessa Ralphs from [facility] to get update on assistance with feral cats on patient's property.  Total of 8 cats have been spayed or neutered. Patient has 4 more cats on property that will be fixed over the next few weeks.   . Called patient for follow up.  Patient is very appreciative of assistance being provided.    . Patient Self Care Activities:  . Self administers medications as prescribed . Attends all scheduled provider appointments . Calls provider office for new concerns or questions  Please see past updates related to this goal by clicking on the "Past Updates" button in the selected goal         Patient verbalizes understanding of instructions provided today.   Telephone follow up appointment with care management team member scheduled for:04/20/20   Ronn Melena, [place] Coordination Social Worker [place] [phone]

## 2020-03-19 NOTE — Telephone Encounter (Signed)
Called the patient. No answer and unable to leave a message on his VM.  Will try again.

## 2020-03-23 ENCOUNTER — Other Ambulatory Visit: Payer: Self-pay | Admitting: Internal Medicine

## 2020-03-30 DIAGNOSIS — N189 Chronic kidney disease, unspecified: Secondary | ICD-10-CM | POA: Diagnosis not present

## 2020-03-30 DIAGNOSIS — N184 Chronic kidney disease, stage 4 (severe): Secondary | ICD-10-CM | POA: Diagnosis not present

## 2020-03-30 DIAGNOSIS — N400 Enlarged prostate without lower urinary tract symptoms: Secondary | ICD-10-CM | POA: Diagnosis not present

## 2020-03-30 DIAGNOSIS — N2581 Secondary hyperparathyroidism of renal origin: Secondary | ICD-10-CM | POA: Diagnosis not present

## 2020-03-30 DIAGNOSIS — I639 Cerebral infarction, unspecified: Secondary | ICD-10-CM | POA: Diagnosis not present

## 2020-03-30 DIAGNOSIS — D631 Anemia in chronic kidney disease: Secondary | ICD-10-CM | POA: Diagnosis not present

## 2020-03-30 MED FILL — SPS 15 GM/60 ML SUSPENSION: 15 | 1 days supply | Qty: 240 | Fill #0

## 2020-04-02 DIAGNOSIS — E875 Hyperkalemia: Secondary | ICD-10-CM | POA: Diagnosis not present

## 2020-04-04 ENCOUNTER — Other Ambulatory Visit: Payer: Self-pay | Admitting: Internal Medicine

## 2020-04-04 DIAGNOSIS — R339 Retention of urine, unspecified: Secondary | ICD-10-CM

## 2020-04-10 ENCOUNTER — Telehealth: Payer: Medicare Other

## 2020-04-11 ENCOUNTER — Ambulatory Visit: Payer: Medicare Other | Admitting: *Deleted

## 2020-04-11 DIAGNOSIS — I63532 Cerebral infarction due to unspecified occlusion or stenosis of left posterior cerebral artery: Secondary | ICD-10-CM

## 2020-04-11 DIAGNOSIS — N184 Chronic kidney disease, stage 4 (severe): Secondary | ICD-10-CM

## 2020-04-11 DIAGNOSIS — I10 Essential (primary) hypertension: Secondary | ICD-10-CM

## 2020-04-11 NOTE — Chronic Care Management (AMB) (Signed)
Chronic Care Management   Follow Up Note   04/11/2020 Name: Riggins Cisek. MRN: [id number] DOB: 1941-10-31  Referred by: [name], MD Reason for referral : Chronic Care Management (HTN, CKD)   Taryll Reichenberger. is a 79 y.o. year old male who is a primary care patient of [name], [name], MD. The CCM team was consulted for assistance with chronic disease management and care coordination needs.    Review of patient status, including review of consultants reports, relevant laboratory and other test results, and collaboration with appropriate care team members and the patient's provider was performed as part of comprehensive patient evaluation and provision of chronic care management services.    SDOH (Social Determinants of Health) assessments performed: No See Care Plan activities for detailed interventions related to [facility])     Outpatient Encounter Medications as of 04/11/2020  Medication Sig  . clonazePAM (KLONOPIN) 0.25 MG disintegrating tablet Take 1 tablet (0.25 mg total) by mouth 3 (three) times daily.  . tamsulosin (FLOMAX) 0.4 MG CAPS capsule TAKE ONE CAPSULE BY MOUTH DAILY AFTER SUPPER  . acetaminophen (TYLENOL) 500 MG tablet Take 2 tablets (1,000 mg total) by mouth every 6 (six) hours as needed for mild pain (or Fever >/= 101).  [name] amLODipine (NORVASC) 10 MG tablet TAKE ONE TABLET BY MOUTH DAILY  . atorvastatin (LIPITOR) 80 MG tablet TAKE ONE TABLET BY MOUTH DAILY (Patient taking differently: Take 80 mg by mouth daily at 6 PM. )  . docusate sodium (COLACE) 100 MG capsule Take 1 capsule (100 mg total) by mouth daily.  [name] ELIQUIS 2.5 MG TABS tablet TAKE ONE TABLET BY MOUTH TWICE A DAY  . finasteride (PROSCAR) 5 MG tablet TAKE ONE TABLET BY MOUTH DAILY (Patient taking differently: Take 5 mg by mouth daily. )  . fludrocortisone (FLORINEF) 0.1 MG tablet TAKE ONE TABLET BY MOUTH TWICE A DAY  . furosemide (LASIX) 20 MG tablet Take 1 tablet (20 mg total) by mouth daily.  .  hydrALAZINE (APRESOLINE) 10 MG tablet Take 1 tablet (10 mg total) by mouth 3 (three) times daily.  [name] ipratropium (ATROVENT) 0.06 % nasal spray Place 2 sprays into the nose 3 (three) times daily. (Patient taking differently: Place 2 sprays into the nose daily as needed for rhinitis. )  . nitroGLYCERIN (NITROSTAT) 0.4 MG SL tablet Place 1 tablet (0.4 mg total) under the tongue every 5 (five) minutes x 3 doses as needed for chest pain.   No facility-administered encounter medications on file as of 04/11/2020.     Objective:  BP Readings from Last 3 Encounters:  03/07/20 107/68  03/04/20 (!) 145/83  02/20/20 (!) 151/65   Wt Readings from Last 3 Encounters:  03/07/20 202 lb 6.4 oz (91.8 kg)  03/04/20 205 lb 14.6 oz (93.4 kg)  02/20/20 220 lb (99.8 kg)   Lab Results  Component Value Date   CREATININE 3.75 (H) 03/07/2020   BUN 55 (H) 03/07/2020   NA 142 03/07/2020   K 5.5 (H) 03/07/2020   CL 110 (H) 03/07/2020   CO2 16 (L) 03/07/2020   Lab Results  Component Value Date   HGBA1C 5.5 02/20/2020   Goals Addressed            This Visit's Progress     Patient Stated   . " I have a rash all over my body, even on my head and it itches and burns so bad I can't sleep." (pt-stated)       CARE  PLAN ENTRY (see longitudinal plan of care for additional care plan information)  Current Barriers:  [name] Knowledge Deficits related to managing pruritic rash- called patient to complete follow up assessment but he states he has a severe rash all over his body even on his head and scalp that burns and itches and is preventing him from sleeping. He says he is using an anti itch cream but it is ineffective. He is asking what to do.He says it started while he was in the hospital in March but it has gotten much worse.  Nurse Case Manager Clinical Goal(s):  [name] Over the next 2-3 days, patient will make acute clinic appointment to have body rash evaluated and treated.   Interventions:  . Inter-disciplinary  care team collaboration (see longitudinal plan of care) . Provided clinic phone number and advised patient to call the clinic today to schedule an acute appointment  Patient Self Care Activities:  . Patient verbalizes understanding of plan to make acute clinic appointment . Does not contact provider office for questions/concerns  Initial goal documentation         Plan:   The care management team will reach out to the patient again over the next 7 days.    Kelli Churn RN, CCM, [facility] RN Care Manager [phone]

## 2020-04-11 NOTE — Patient Instructions (Signed)
Visit Information Please call the clinic today and make an acute appointment so a doctor can assess and treat your rash.  Goals Addressed            This Visit's Progress     Patient Stated   . " I have a rash all over my body, even on my head and it itches and burns so bad I can't sleep." (pt-stated)       CARE PLAN ENTRY (see longitudinal plan of care for additional care plan information)  Current Barriers:  [name] Knowledge Deficits related to managing pruritic rash- called patient to complete follow up assessment but he states he has a severe rash all over his body even on his head and scalp that burn and itches and is preventing him from sleeping. He says he is using an anti itch cream but it is ineffective. He is asking what to do.  Nurse Case Manager Clinical Goal(s):  [name] Over the next 2-3 days, patient will make acute clinic appointment to have body rash evaluated and treated.   Interventions:  . Inter-disciplinary care team collaboration (see longitudinal plan of care) . Provided clinic phone number and advised patient to call the clinic today to schedule an acute appointment  Patient Self Care Activities:  . Patient verbalizes understanding of plan to make acute clinic appointment . Does not contact provider office for questions/concerns  Initial goal documentation        The patient verbalized understanding of instructions provided today and declined a print copy of patient instruction materials.   The care management team will reach out to the patient again over the next 7 days.   Kelli Churn RN, CCM, [facility] RN Care Manager [phone]

## 2020-04-11 NOTE — Progress Notes (Signed)
Internal Medicine Clinic Resident   I have personally reviewed this encounter including the documentation in this note and/or discussed this patient with the care management provider. I will address any urgent items identified by the care management provider and will communicate my actions to the patient's PCP. I have reviewed the patient's CCM visit with my supervising attending.  Brandon Winson Eichorn, MD 04/11/2020    

## 2020-04-16 ENCOUNTER — Ambulatory Visit: Payer: Medicare Other

## 2020-04-16 ENCOUNTER — Telehealth: Payer: Self-pay | Admitting: *Deleted

## 2020-04-16 NOTE — Progress Notes (Signed)
Internal Medicine Clinic Attending  CCM services provided by the care management provider and their documentation were discussed with Dr. Winfrey. We reviewed the pertinent findings, urgent action items addressed by the resident and non-urgent items to be addressed by the PCP.  I agree with the assessment, diagnosis, and plan of care documented in the CCM and resident's note.  Earvin Blazier, MD 04/16/2020  

## 2020-04-16 NOTE — Telephone Encounter (Signed)
Call from pt c/o rash, ?reaction - pt is unsure. Stated it started 3 -4 days ago; "bumps and sores" on his arms,head,legs, mainly on his back which itches. Denies redness and sob. Stated they "burn"; tried OTC lotion but did not help. I asked if he noticed fleas, bed bugs or anything else; stated he does not think he has any bugs. [facility] appt scheduled for today @ 1515 PM if he can get a ride, if not he will call us back and re-schedule for tomorrow. Informed any changes in breathing ie sob to call 911 and/or go to [facility] - voiced understanding.

## 2020-04-16 NOTE — Telephone Encounter (Signed)
thanks

## 2020-04-18 ENCOUNTER — Ambulatory Visit: Payer: Medicare Other | Admitting: *Deleted

## 2020-04-18 NOTE — Patient Instructions (Signed)
Visit Information Please notify me if you need help with transportation to your appointment on 04/23/20  Goals Addressed            This Visit's Progress     Patient Stated   . " I have a rash all over my body, even on my head and it itches and burns so bad I can't sleep." (pt-stated)       CARE PLAN ENTRY (see longitudinal plan of care for additional care plan information)  Current Barriers:  [name] Knowledge Deficits related to managing pruritic rash- called patient to complete follow up assessment, he says the severe rash all over his body even on his head and scalp remains and he has made an appointment to see his provider on 04/23/20. He says " I don't think it's bed bug or flea bites.' He describes the rash as being worse on his right arm, no rash on his feet or his knees and that the itch is worse at night.  He says he continues to use over the counter anti itch cream and he says his niece is going to bring him an antihistamine pill to take to see if that relieves the symptoms. In regards to his chronic kidney disease, he says someone from a nephrologist's office called him to arrange an initial appointment and he was told to call them back after he is seen by his provider for the rash.   Nurse Case Manager Clinical Goal(s):  [name] Over the next 7 days, patient will keep his acute clinic appointment to have body rash evaluated and treated.   Interventions:  . Inter-disciplinary care team collaboration (see longitudinal plan of care) . Assessed if patient has an initial appointment with a nephrologist for his CKD . Assessed rash via patient description . Cautioned patient that the antihistamine my make him drowsy . Encouraged patient to contact this CCM RN if he has problems securing transportation to his appointment on 5/10.  Patient Self Care Activities:  . Patient verbalizes understanding of plan to make acute clinic appointment . Does not contact provider office for  questions/concerns  Please see past updates related to this goal by clicking on the "Past Updates" button in the selected goal         The patient verbalized understanding of instructions provided today and declined a print copy of patient instruction materials.   The care management team will reach out to the patient again over the next 7 days.   Kelli Churn RN, CCM, [facility] RN Care Manager [phone]

## 2020-04-18 NOTE — Chronic Care Management (AMB) (Signed)
Chronic Care Management   Follow Up Note   04/18/2020 Name: Derrick Hendrix. MRN: [id number] DOB: [birth year]  Referred by: [name], MD Reason for referral : Chronic Care Management (CKD, A Fib)   Derrick Hendrix. is a 79 y.o. year old male who is a primary care patient of [name], [name], MD. The CCM team was consulted for assistance with chronic disease management and care coordination needs.    Review of patient status, including review of consultants reports, relevant laboratory and other test results, and collaboration with appropriate care team members and the patient's provider was performed as part of comprehensive patient evaluation and provision of chronic care management services.    SDOH (Social Determinants of Health) assessments performed: No See Care Plan activities for detailed interventions related to [facility])     Outpatient Encounter Medications as of 04/18/2020  Medication Sig  . clonazePAM (KLONOPIN) 0.25 MG disintegrating tablet Take 1 tablet (0.25 mg total) by mouth 3 (three) times daily.  . tamsulosin (FLOMAX) 0.4 MG CAPS capsule TAKE ONE CAPSULE BY MOUTH DAILY AFTER SUPPER  . acetaminophen (TYLENOL) 500 MG tablet Take 2 tablets (1,000 mg total) by mouth every 6 (six) hours as needed for mild pain (or Fever >/= 101).  [name] amLODipine (NORVASC) 10 MG tablet TAKE ONE TABLET BY MOUTH DAILY  . atorvastatin (LIPITOR) 80 MG tablet TAKE ONE TABLET BY MOUTH DAILY (Patient taking differently: Take 80 mg by mouth daily at 6 PM. )  . docusate sodium (COLACE) 100 MG capsule Take 1 capsule (100 mg total) by mouth daily.  [name] ELIQUIS 2.5 MG TABS tablet TAKE ONE TABLET BY MOUTH TWICE A DAY  . finasteride (PROSCAR) 5 MG tablet TAKE ONE TABLET BY MOUTH DAILY (Patient taking differently: Take 5 mg by mouth daily. )  . fludrocortisone (FLORINEF) 0.1 MG tablet TAKE ONE TABLET BY MOUTH TWICE A DAY  . furosemide (LASIX) 20 MG tablet Take 1 tablet (20 mg total) by mouth daily.  .  hydrALAZINE (APRESOLINE) 10 MG tablet Take 1 tablet (10 mg total) by mouth 3 (three) times daily.  [name] ipratropium (ATROVENT) 0.06 % nasal spray Place 2 sprays into the nose 3 (three) times daily. (Patient taking differently: Place 2 sprays into the nose daily as needed for rhinitis. )  . nitroGLYCERIN (NITROSTAT) 0.4 MG SL tablet Place 1 tablet (0.4 mg total) under the tongue every 5 (five) minutes x 3 doses as needed for chest pain.   No facility-administered encounter medications on file as of 04/18/2020.     Objective:  Wt Readings from Last 3 Encounters:  03/07/20 202 lb 6.4 oz (91.8 kg)  03/04/20 205 lb 14.6 oz (93.4 kg)  02/20/20 220 lb (99.8 kg)   Lab Results  Component Value Date   HGBA1C 5.5 02/20/2020   Lab Results  Component Value Date   CREATININE 3.75 (H) 03/07/2020   BUN 55 (H) 03/07/2020   NA 142 03/07/2020   K 5.5 (H) 03/07/2020   CL 110 (H) 03/07/2020   CO2 16 (L) 03/07/2020    Goals Addressed            This Visit's Progress     Patient Stated   . " I have a rash all over my body, even on my head and it itches and burns so bad I can't sleep." (pt-stated)       CARE PLAN ENTRY (see longitudinal plan of care for additional care plan information)  Current Barriers:  Knowledge  Deficits related to managing pruritic rash- called patient to complete follow up assessment, he says the severe rash all over his body even on his head and scalp remains and he has made an appointment to see his provider on 04/23/20. He says " I don't think it's bed bug or flea bites.' He describes the rash as being worse on his right arm, no rash on his feet or his knees and that the itch is worse at night.  He says he continues to use over the counter anti itch cream and he says his niece is going to bring him an antihistamine pill to take to see if that relieves the symptoms. In regards to his chronic kidney disease, he says someone from a nephrologist's office called him to arrange an  initial appointment and he was told to call them back after he is seen by his provider for the rash.    Nurse Case Manager Clinical Goal(s):  [name] Over the next 7 days, patient will keep his acute clinic appointment to have body rash evaluated and treated.   Interventions:  . Inter-disciplinary care team collaboration (see longitudinal plan of care) . Assessed if patient has an initial appointment with a nephrologist for his CKD . Assessed rash via patient description . Cautioned patient that the antihistamine my make him drowsy . Encouraged patient to contact this CCM RN if he has problems securing transportation to his appointment on 5/10.   Patient Self Care Activities:  . Patient verbalizes understanding of plan to make acute clinic appointment . Does not contact provider office for questions/concerns  Please see past updates related to this goal by clicking on the "Past Updates" button in the selected goal          Plan:   The care management team will reach out to the patient again over the next 7 days.    Kelli Churn RN, CCM, [facility] RN Care Manager [phone]

## 2020-04-19 NOTE — Progress Notes (Signed)
Internal Medicine Clinic Resident   I have personally reviewed this encounter including the documentation in this note and/or discussed this patient with the care management provider. I will address any urgent items identified by the care management provider and will communicate my actions to the patient's PCP. I have reviewed the patient's CCM visit with my supervising attending.  Lars Mage, MD 04/19/2020

## 2020-04-20 ENCOUNTER — Other Ambulatory Visit: Payer: Self-pay | Admitting: Internal Medicine

## 2020-04-20 ENCOUNTER — Telehealth: Payer: Medicare Other

## 2020-04-20 DIAGNOSIS — E875 Hyperkalemia: Secondary | ICD-10-CM

## 2020-04-20 DIAGNOSIS — N184 Chronic kidney disease, stage 4 (severe): Secondary | ICD-10-CM

## 2020-04-20 NOTE — Progress Notes (Signed)
Internal Medicine Clinic Attending  CCM services provided by the care management provider and their documentation were discussed with Dr. Maricela Bo. We reviewed the pertinent findings, urgent action items addressed by the resident and non-urgent items to be addressed by the PCP.  I agree with the assessment, diagnosis, and plan of care documented in the CCM and resident's note.  Aldine Contes, MD 04/20/2020

## 2020-04-23 ENCOUNTER — Ambulatory Visit: Payer: Medicare Other | Admitting: *Deleted

## 2020-04-23 ENCOUNTER — Other Ambulatory Visit: Payer: Self-pay | Admitting: Internal Medicine

## 2020-04-23 ENCOUNTER — Ambulatory Visit (INDEPENDENT_AMBULATORY_CARE_PROVIDER_SITE_OTHER): Payer: Medicare Other | Admitting: Internal Medicine

## 2020-04-23 VITALS — BP 149/76 | HR 101 | Temp 98.0°F | Wt 208.9 lb

## 2020-04-23 DIAGNOSIS — L409 Psoriasis, unspecified: Secondary | ICD-10-CM | POA: Insufficient documentation

## 2020-04-23 DIAGNOSIS — R21 Rash and other nonspecific skin eruption: Secondary | ICD-10-CM | POA: Diagnosis not present

## 2020-04-23 DIAGNOSIS — I10 Essential (primary) hypertension: Secondary | ICD-10-CM

## 2020-04-23 DIAGNOSIS — N184 Chronic kidney disease, stage 4 (severe): Secondary | ICD-10-CM

## 2020-04-23 MED ORDER — BLOOD PRESSURE KIT DEVI: 0 refills | Status: DC

## 2020-04-23 MED ORDER — DIPHENHYDRAMINE HCL 25 MG PO CAPS
25.0000 mg | ORAL_CAPSULE | Freq: Once | ORAL | Status: AC
  Administered 2020-04-23: 25 mg via ORAL

## 2020-04-23 NOTE — Assessment & Plan Note (Signed)
Patient states that he started noticing a rash on his back approximately 2 weeks ago after he returned from his nephrologist appointment. According to the patient the nephrologist gave him a medication that was powdery and told him to go get some labs. We requested office note from this visit 03/30/20 and according to nephrology documentation the patient complained of a skin rash in his left upper extremity during that visit.  The rash was described to be lichenified, nonbleeding and patient was told to be referred to dermatology.  There was no documentation of any medication being given during that visit.  The patient states that the cough is very bothersome, itchy and keeps him up at night.  Assessment and plan The patient's rash is hyperpigmented, maculopapular in nature, itching, and present all over the body in no particular distribution.  Rash is concerning for drug reaction. Patient does take hydralazine which is concerning for drug-induced lupus.  Will order antibodies to test for drug-induced lupus.  Will check CBC and CMP (to evaluate liver enzymes).  Refer to dermatology.  Benadryl 25 mg every 6 hours.  -ANA, antidsDNA, antihistone, anti-Ro, anti-La, anti-SM, RNP -CBC, CMP -Refer to dermatology

## 2020-04-23 NOTE — Patient Instructions (Signed)
Visit Information It was nice seeing you today. I hope the dermatologist can help your rash.  Goals Addressed            This Visit's Progress     Patient Stated   . " I don't like it when the pharmacy changes my pill packs w/o telling me.; it makes my blood pressure go up. Also, I'm trying to follow the diet they want me to follow but I don't know what to eat. I eat a lot of soup but I know it has a lot of salt." (pt-stated)       CARE PLAN ENTRY (see longitudinal plan of care for additional care plan information)  Current Barriers:  . Chronic Disease Management support and education needs related to HTN and CKD  and hyporeninemic hypoaldosteronism- met with patient in the clinic after he was seen by provider, his BP was 140/100 , he is amenable to monitoring his BP at home  Nurse Case Manager Clinical Goal(s):  [name] Over the next 30 days, patient will verbalize basic understanding of HTN , CKD and  hyporeninemic hypoaldosteronism disease process and self health management plan as evidenced by voicing understanding of any medication changes , taking medications as prescribed and following recommended diet  Interventions:  . Provided education to patient regarding blood pressure targets after reviewing his blood pressure reading in the clinic of 140/100 . Discussed beginning BP self monitoring at home . Collaborated with providers - requested Dr Maricela Bo write a Rx for a home blood pressure monitor . Once home BP monitor is secured, will teach patient how to use  Patient Self Care Activities:  . Self administers medications as prescribed . Attends all scheduled provider appointments . Calls provider office for new concerns or questions . Patient voices understanding of any medication changes and changes to pill packs . Patient voices understanding of diet recommendations . Unable to perform IADLs independently- uses [employer] for transportation  Please see past updates related to this goal  by clicking on the "Past Updates" button in the selected goal      . COMPLETED: " I have a rash all over my body, even on my head and it itches and burns so bad I can't sleep." (pt-stated)       CARE PLAN ENTRY (see longitudinal plan of care for additional care plan information)  Current Barriers:  [name] Knowledge Deficits related to managing pruritic rash- met with patient after he was seen in the clinic for his rash Nurse Case Manager Clinical Goal(s):  [name] Over the next 7 days, patient will keep his acute clinic appointment to have body rash evaluated and treated.   Interventions:  . Inter-disciplinary care team collaboration (see longitudinal plan of care)- discussed plan of referral to dermatologist to assess and treat rash . Cautioned patient that the antihistamine Benadryl recommended by Dr Maricela Bo may make him drowsy . Encouraged patient to contact this CCM RN if he has problems securing transportation to his appointment on 5/10.  Patient Self Care Activities:  . Patient verbalizes understanding of plan to make acute clinic appointment . Self administers medications as prescribed . Attends all scheduled provider appointments . Calls pharmacy for medication refills . Calls provider office for new concerns or questions   Please see past updates related to this goal by clicking on the "Past Updates" button in the selected goal      . COMPLETED: "I told the social worker I have a lot of stray cats" (pt-stated)  CARE PLAN ENTRY (see longitudinal plan of care for additional care plan information)   Current Barriers:  . Chronic Disease Management support, education, and care coordination needs related to Atrial Fibrillation, HTN, DMII, and CKD Stage 3.- met with patient after acute clinic visit- he says he has 8 cats, one had kittens. No other social determinants of health issues voiced.   Case Manager Clinical Goal(s):  [name] Over the next 30 days, patient will work with BSW to address  needs related to ridding property of stray cats in patient with Atrial Fibrillation, HTN, DMII, and CKD Stage 3.  Interventions:  . Asked patient about the stray cat situation; he says there are still 8 there but the majority have been neutered  Patient Self Care Activities:  . Patient verbalizes understanding of plan to work with BSW to explore resources to rid property of stray cats.  Please see past updates related to this goal by clicking on the "Past Updates" button in the selected goal        The patient verbalized understanding of instructions provided today and declined a print copy of patient instruction materials.   The care management team will reach out to the patient again over the next 7-14 days.   Kelli Churn RN, CCM, [facility] RN Care Manager [phone]

## 2020-04-23 NOTE — Progress Notes (Signed)
   CC: Rash   HPI:  [name]. is a 79 y.o. type 2 diabetes, ckd4, essential hypertension who presents for evaluation of rash. Please see problem based charting for evaluation, assessment, and plan.  Past Medical History:  Diagnosis Date  . Abscess of foot without toes, left 08/28/2017  . Chronic kidney disease (CKD), stage III (moderate)   . Constipation 11/07/2014  . Daily headache    "lately" (04/17/2016)  . Depression   . Dizziness 03/07/2016  . Headache 06/26/2014  . Heart murmur   . High cholesterol   . Hyperkalemia 08/2014  . Hyperkalemia 10/20/2014  . Hyperkalemia 10/05/2018  . Hyperlipemia 11/11/2016  . Hypertension   . Neuropathic pain 02/06/2015  . Osteomyelitis ([place])   . PAD (peripheral artery disease) ([place])   . Paronychia of great toe, right   . Preop cardiovascular exam   . Right foot pain 02/09/2017  . Stable angina ([place]) 06/27/2014  . Stroke ([place]) 03/2016   hx of PAD; j"ust lots of headaches since" (04/17/2016)  . Vitamin B12 deficiency 06/27/2014  . Weight loss, unintentional 06/27/2014   Review of Systems:    Per hpi  Physical Exam:  Vitals:   04/23/20 0849  BP: (!) 148/100  Pulse: (!) 113  Temp: 98 F (36.7 C)  TempSrc: Oral  SpO2: 100%  Weight: 208 lb 14.4 oz (94.8 kg)   Physical Exam  Constitutional: He appears well-developed and well-nourished. No distress.  Itching throughout the exam  Cardiovascular: Normal rate, regular rhythm and normal heart sounds.  Respiratory: Effort normal and breath sounds normal. No respiratory distress. He has no wheezes.  GI: Soft.  Neurological: He is alert.  Skin: He is not diaphoretic.  Nonblanching, hyperpigmented, approximately 1 cm in size maculopapular rash present in a generalized fashion all over the body.  Psychiatric: He has a normal mood and affect. His behavior is normal. Judgment and thought content normal.   Assessment & Plan:   See Encounters Tab for problem based charting.  Patient  discussed with Dr. Dareen Piano

## 2020-04-23 NOTE — Addendum Note (Signed)
Addended by: Marcelino Duster on: 04/23/2020 10:08 AM   Modules accepted: Orders

## 2020-04-23 NOTE — Patient Instructions (Addendum)
It was a pleasure to see you today [name]. Please make the following changes:  For your rash: Please see dermatology  We got bloodwork to determine what might be causing the rash For the itching you can take benadryl 25mg  every 6 hrs  If you have any questions or concerns, please call our clinic at [phone] between 9am-5pm and after hours call [phone] and ask for the internal medicine resident on call. If you feel you are having a medical emergency please call 911.   Thank you, we look forward to help you remain healthy!  Lars Mage, MD Internal Medicine PGY3

## 2020-04-23 NOTE — Chronic Care Management (AMB) (Signed)
Chronic Care Management   Follow Up Note   04/23/2020 Name: Derrick Hendrix. MRN: [id number] DOB: Jun 17, 1941  Referred by: [name], MD Reason for referral : Chronic Care Management (DM, A Fib, CKD)   Derrick Hendrix. is a 79 y.o. year old male who is a primary care patient of [name], [name], MD. The CCM team was consulted for assistance with chronic disease management and care coordination needs.    Review of patient status, including review of consultants reports, relevant laboratory and other test results, and collaboration with appropriate care team members and the patient's provider was performed as part of comprehensive patient evaluation and provision of chronic care management services.    SDOH (Social Determinants of Health) assessments performed: No See Care Plan activities for detailed interventions related to [facility])     Outpatient Encounter Medications as of 04/23/2020  Medication Sig  . clonazePAM (KLONOPIN) 0.25 MG disintegrating tablet Take 1 tablet (0.25 mg total) by mouth 3 (three) times daily.  . tamsulosin (FLOMAX) 0.4 MG CAPS capsule TAKE ONE CAPSULE BY MOUTH DAILY AFTER SUPPER  . acetaminophen (TYLENOL) 500 MG tablet Take 2 tablets (1,000 mg total) by mouth every 6 (six) hours as needed for mild pain (or Fever >/= 101).  [name] amLODipine (NORVASC) 10 MG tablet TAKE ONE TABLET BY MOUTH DAILY  . atorvastatin (LIPITOR) 80 MG tablet TAKE ONE TABLET BY MOUTH DAILY (Patient taking differently: Take 80 mg by mouth daily at 6 PM. )  . docusate sodium (COLACE) 100 MG capsule Take 1 capsule (100 mg total) by mouth daily.  [name] ELIQUIS 2.5 MG TABS tablet TAKE ONE TABLET BY MOUTH TWICE A DAY  . finasteride (PROSCAR) 5 MG tablet TAKE ONE TABLET BY MOUTH DAILY (Patient taking differently: Take 5 mg by mouth daily. )  . fludrocortisone (FLORINEF) 0.1 MG tablet TAKE ONE TABLET BY MOUTH TWICE A DAY  . furosemide (LASIX) 20 MG tablet Take 1 tablet (20 mg total) by mouth daily.  .  hydrALAZINE (APRESOLINE) 10 MG tablet Take 1 tablet (10 mg total) by mouth 3 (three) times daily.  [name] ipratropium (ATROVENT) 0.06 % nasal spray Place 2 sprays into the nose 3 (three) times daily. (Patient taking differently: Place 2 sprays into the nose daily as needed for rhinitis. )  . nitroGLYCERIN (NITROSTAT) 0.4 MG SL tablet Place 1 tablet (0.4 mg total) under the tongue every 5 (five) minutes x 3 doses as needed for chest pain.  . [EXPIRED] diphenhydrAMINE (BENADRYL) capsule 25 mg    No facility-administered encounter medications on file as of 04/23/2020.     Objective:  BP Readings from Last 3 Encounters:  04/23/20 (!) 149/76  03/07/20 107/68  03/04/20 (!) 145/83   Wt Readings from Last 3 Encounters:  04/23/20 208 lb 14.4 oz (94.8 kg)  03/07/20 202 lb 6.4 oz (91.8 kg)  03/04/20 205 lb 14.6 oz (93.4 kg)   Lab Results  Component Value Date   HGBA1C 5.5 02/20/2020    Goals Addressed            This Visit's Progress     Patient Stated   . " I don't like it when the pharmacy changes my pill packs w/o telling me.; it makes my blood pressure go up. Also, I'm trying to follow the diet they want me to follow but I don't know what to eat. I eat a lot of soup but I know it has a lot of salt." (pt-stated)  CARE PLAN ENTRY (see longitudinal plan of care for additional care plan information)  Current Barriers:  . Chronic Disease Management support and education needs related to HTN and CKD  and hyporeninemic hypoaldosteronism- met with patient in the clinic after he was seen by provider, his BP was 140/100 , he is amenable to monitoring his BP at home  Nurse Case Manager Clinical Goal(s):  [name] Over the next 30 days, patient will verbalize basic understanding of HTN , CKD and  hyporeninemic hypoaldosteronism disease process and self health management plan as evidenced by voicing understanding of any medication changes , taking medications as prescribed and following recommended  diet  Interventions:  . Provided education to patient regarding blood pressure targets after reviewing his blood pressure reading in the clinic of 140/100 . Discussed beginning BP self monitoring at home . Collaborated with providers - requested Dr Maricela Bo write a Rx for a home blood pressure monitor . Once home BP monitor is secured, will teach patient how to use  Patient Self Care Activities:  . Self administers medications as prescribed . Attends all scheduled provider appointments . Calls provider office for new concerns or questions . Patient voices understanding of any medication changes and changes to pill packs . Patient voices understanding of diet recommendations . Unable to perform IADLs independently- uses [employer] for transportation  Please see past updates related to this goal by clicking on the "Past Updates" button in the selected goal      . COMPLETED: " I have a rash all over my body, even on my head and it itches and burns so bad I can't sleep." (pt-stated)       CARE PLAN ENTRY (see longitudinal plan of care for additional care plan information)  Current Barriers:  [name] Knowledge Deficits related to managing pruritic rash- met with patient after he was seen in the clinic for his rash Nurse Case Manager Clinical Goal(s):  [name] Over the next 7 days, patient will keep his acute clinic appointment to have body rash evaluated and treated.   Interventions:  . Inter-disciplinary care team collaboration (see longitudinal plan of care)- discussed plan of referral to dermatologist to assess and treat rash . Cautioned patient that the antihistamine Benadryl recommended by Dr Maricela Bo may make him drowsy . Encouraged patient to contact this CCM RN if he has problems securing transportation to his appointment on 5/10.  Patient Self Care Activities:  . Patient verbalizes understanding of plan to make acute clinic appointment . Self administers medications as prescribed . Attends all  scheduled provider appointments . Calls pharmacy for medication refills . Calls provider office for new concerns or questions   Please see past updates related to this goal by clicking on the "Past Updates" button in the selected goal      . COMPLETED: "I told the social worker I have a lot of stray cats" (pt-stated)       [place] (see longitudinal plan of care for additional care plan information)   Current Barriers:  . Chronic Disease Management support, education, and care coordination needs related to Atrial Fibrillation, HTN, DMII, and CKD Stage 3.- met with patient after acute clinic visit- he says he has 8 cats, one had kittens. No other social determinants of health issues voiced.   Case Manager Clinical Goal(s):  [name] Over the next 30 days, patient will work with BSW to address needs related to ridding property of stray cats in patient with Atrial Fibrillation, HTN, DMII, and  CKD Stage 3.  Interventions:  . Asked patient about the stray cat situation; he says there are still 8 there but the majority have been neutered  Patient Self Care Activities:  . Patient verbalizes understanding of plan to work with BSW to explore resources to rid property of stray cats.  Please see past updates related to this goal by clicking on the "Past Updates" button in the selected goal         Plan:   The care management team will reach out to the patient again over the next 7-14  days.    Kelli Churn RN, CCM, [facility] RN Care Manager [phone]

## 2020-04-23 NOTE — Addendum Note (Signed)
Addended by: Truddie Crumble on: 04/23/2020 10:14 AM   Modules accepted: Orders

## 2020-04-24 ENCOUNTER — Other Ambulatory Visit: Payer: Self-pay | Admitting: Internal Medicine

## 2020-04-24 LAB — CMP14 + ANION GAP
ALT: 9 IU/L (ref 0–44)
AST: 13 IU/L (ref 0–40)
Albumin/Globulin Ratio: 1.4 (ref 1.2–2.2)
Albumin: 4 g/dL (ref 3.6–4.6)
Alkaline Phosphatase: 108 IU/L (ref 39–117)
Anion Gap: 17 mmol/L (ref 10.0–18.0)
BUN/Creatinine Ratio: 16 (ref 10–24)
BUN: 58 mg/dL — ABNORMAL HIGH (ref 8–27)
Bilirubin Total: <0.2 mg/dL (ref 0.0–1.2)
CO2: 16 mmol/L — ABNORMAL LOW (ref 20–29)
Calcium: 8.1 mg/dL — ABNORMAL LOW (ref 8.6–10.2)
Chloride: 112 mmol/L — ABNORMAL HIGH (ref 96–106)
Creatinine, Ser: 3.71 mg/dL — ABNORMAL HIGH (ref 0.76–1.27)
GFR calc Af Amer: 17 mL/min/{1.73_m2} — ABNORMAL LOW (ref >59)
GFR calc non Af Amer: 14 mL/min/{1.73_m2} — ABNORMAL LOW (ref >59)
Globulin, Total: 2.8 g/dL (ref 1.5–4.5)
Glucose: 82 mg/dL (ref 65–99)
Potassium: 5.5 mmol/L — ABNORMAL HIGH (ref 3.5–5.2)
Sodium: 145 mmol/L — ABNORMAL HIGH (ref 134–144)
Total Protein: 6.8 g/dL (ref 6.0–8.5)

## 2020-04-24 LAB — ANTI-DNA ANTIBODY, DOUBLE-STRANDED: dsDNA Ab: 2 IU/mL (ref 0–9)

## 2020-04-24 LAB — SJOGRENS SYNDROME-A EXTRACTABLE NUCLEAR ANTIBODY: ENA SSA (RO) Ab: <0.2 AI (ref 0.0–0.9)

## 2020-04-24 LAB — CBC
Hematocrit: 30.6 % — ABNORMAL LOW (ref 37.5–51.0)
Hemoglobin: 9.8 g/dL — ABNORMAL LOW (ref 13.0–17.7)
MCH: 30.3 pg (ref 26.6–33.0)
MCHC: 32 g/dL (ref 31.5–35.7)
MCV: 95 fL (ref 79–97)
Platelets: 268 10*3/uL (ref 150–450)
RBC: 3.23 x10E6/uL — ABNORMAL LOW (ref 4.14–5.80)
RDW: 13.6 % (ref 11.6–15.4)
WBC: 9.8 10*3/uL (ref 3.4–10.8)

## 2020-04-24 LAB — HISTONE ANTIBODIES, IGG, BLOOD: Histone Ab: 0.5 Units (ref 0.0–0.9)

## 2020-04-24 LAB — SJOGREN'S SYNDROME ANTIBODS(SSA + SSB)
ENA SSA (RO) Ab: <0.2 AI (ref 0.0–0.9)
ENA SSB (LA) Ab: <0.2 AI (ref 0.0–0.9)

## 2020-04-24 LAB — ANTINUCLEAR ANTIBODIES, IFA: ANA Titer 1: NEGATIVE

## 2020-04-25 NOTE — Addendum Note (Signed)
Addended by: Lars Mage on: 04/25/2020 01:01 PM   Modules accepted: Orders

## 2020-04-30 ENCOUNTER — Ambulatory Visit: Payer: Medicare Other | Admitting: *Deleted

## 2020-04-30 DIAGNOSIS — N184 Chronic kidney disease, stage 4 (severe): Secondary | ICD-10-CM

## 2020-04-30 DIAGNOSIS — I10 Essential (primary) hypertension: Secondary | ICD-10-CM

## 2020-04-30 NOTE — Addendum Note (Signed)
Addended by: Harvie Heck on: 04/30/2020 03:47 PM   Modules accepted: Orders

## 2020-04-30 NOTE — Chronic Care Management (AMB) (Signed)
Chronic Care Management   Follow Up Note   04/30/2020 Name: Derrick Hendrix. MRN: [id number] DOB: [birth year]  Referred by: [name], MD Reason for referral : Chronic Care Management (HTN, CKD)   Derrick Hendrix. is a 79 y.o. year old male who is a primary care patient of [name], [name], MD. The CCM team was consulted for assistance with chronic disease management and care coordination needs.    Review of patient status, including review of consultants reports, relevant laboratory and other test results, and collaboration with appropriate care team members and the patient's provider was performed as part of comprehensive patient evaluation and provision of chronic care management services.    SDOH (Social Determinants of Health) assessments performed: No See Care Plan activities for detailed interventions related to [facility])     Outpatient Encounter Medications as of 04/30/2020  Medication Sig  . clonazePAM (KLONOPIN) 0.25 MG disintegrating tablet Take 1 tablet (0.25 mg total) by mouth 3 (three) times daily.  . tamsulosin (FLOMAX) 0.4 MG CAPS capsule TAKE ONE CAPSULE BY MOUTH DAILY AFTER SUPPER  . acetaminophen (TYLENOL) 500 MG tablet Take 2 tablets (1,000 mg total) by mouth every 6 (six) hours as needed for mild pain (or Fever >/= 101).  [name] amLODipine (NORVASC) 10 MG tablet TAKE ONE TABLET BY MOUTH DAILY  . atorvastatin (LIPITOR) 80 MG tablet TAKE ONE TABLET BY MOUTH DAILY (Patient taking differently: Take 80 mg by mouth daily at 6 PM. )  . Blood Pressure Monitoring (BLOOD PRESSURE KIT) DEVI Please check blood pressure twice a day. In the morning and evening. Please record numbers on a sheet of paper.  . docusate sodium (COLACE) 100 MG capsule Take 1 capsule (100 mg total) by mouth daily.  [name] ELIQUIS 2.5 MG TABS tablet TAKE ONE TABLET BY MOUTH TWICE A DAY  . finasteride (PROSCAR) 5 MG tablet TAKE ONE TABLET BY MOUTH DAILY (Patient taking differently: Take 5 mg by mouth daily. )  .  fludrocortisone (FLORINEF) 0.1 MG tablet TAKE ONE TABLET BY MOUTH TWICE A DAY  . furosemide (LASIX) 20 MG tablet TAKE ONE TABLET BY MOUTH DAILY  . hydrALAZINE (APRESOLINE) 10 MG tablet TAKE ONE TABLET BY MOUTH THREE TIMES A DAY  . ipratropium (ATROVENT) 0.06 % nasal spray Place 2 sprays into the nose 3 (three) times daily. (Patient taking differently: Place 2 sprays into the nose daily as needed for rhinitis. )  . nitroGLYCERIN (NITROSTAT) 0.4 MG SL tablet Place 1 tablet (0.4 mg total) under the tongue every 5 (five) minutes x 3 doses as needed for chest pain.   No facility-administered encounter medications on file as of 04/30/2020.     Objective:  Lab Results  Component Value Date   CREATININE 3.71 (H) 04/23/2020   BUN 58 (H) 04/23/2020   NA 145 (H) 04/23/2020   K 5.5 (H) 04/23/2020   CL 112 (H) 04/23/2020   CO2 16 (L) 04/23/2020   Lab Results  Component Value Date   HGBA1C 5.5 02/20/2020    Goals Addressed            This Visit's Progress     Patient Stated   . " I don't like it when the pharmacy changes my pill packs w/o telling me.; it makes my blood pressure go up. Also, I'm trying to follow the diet they want me to follow but I don't know what to eat. I eat a lot of soup but I know it has a lot of  salt." (pt-stated)       CARE PLAN ENTRY (see longitudinal plan of care for additional care plan information)  Current Barriers:  . Chronic Disease Management support and education needs related to HTN and CKD  and hyporeninemic hypoaldosteronism- met with patient in the clinic after he was seen by provider, his BP was 140/100 , he is amenable to monitoring his BP at home  Nurse Case Manager Clinical Goal(s):  [name] Over the next 30 days, patient will verbalize basic understanding of HTN , CKD and  hyporeninemic hypoaldosteronism disease process and self health management plan as evidenced by voicing understanding of any medication changes , taking medications as prescribed and  following recommended diet  Interventions:  . Provided education to patient regarding blood pressure targets after reviewing his blood pressure reading in the clinic of 140/100 . Discussed beginning BP self monitoring at home . Collaborated with providers - order for blood pressure home monitor written in medication section so will ask covering provider to write another order for BP monitor . Once home BP monitor is secured, will teach patient how to use  Patient Self Care Activities:  . Self administers medications as prescribed . Attends all scheduled provider appointments . Calls provider office for new concerns or questions . Patient voices understanding of any medication changes and changes to pill packs . Patient voices understanding of diet recommendations . Unable to perform IADLs independently- uses [employer] for transportation  Please see past updates related to this goal by clicking on the "Past Updates" button in the selected goal      . " I have a rash all over my body, even on my head and it itches and burns so bad I can't sleep." (pt-stated)       CARE PLAN ENTRY (see longitudinal plan of care for additional care plan information)  Current Barriers:  [name] Knowledge Deficits related to managing pruritic rash- patient states he is rash is the same , still very pruritic, says he has an appointment with a dermatologist on 05/17/20 and will see his kidney doctor tomorrow and he is going to ask him about the rash, he says he is still taking the benadryl and it allows him to sleep 3-4 hours at a time at night. He says he continues to use an over the counter anti-itch cream. Nurse Case Manager Clinical Goal(s):  [name] Over the next 30 days, patient will keep his appointment with the dermatologist to have body rash evaluated and treated.   Interventions:  . Inter-disciplinary care team collaboration (see longitudinal plan of care)- discussed plan of referral to dermatologist to assess and treat  rash . Again cautioned patient that the antihistamine Benadryl recommended by Dr Maricela Bo on 04/23/20 may make him drowsy   Patient Self Care Activities:  . Patient verbalizes understanding of plan to make acute clinic appointment . Self administers medications as prescribed . Attends all scheduled provider appointments . Calls pharmacy for medication refills . Calls provider office for new concerns or questions   Please see past updates related to this goal by clicking on the "Past Updates" button in the selected goal          Plan:   The care management team will reach out to the patient again over the next 30 days.    Kelli Churn RN, CCM, [facility] RN Care Manager [phone]

## 2020-04-30 NOTE — Progress Notes (Signed)
Internal Medicine Clinic Resident  I have personally reviewed this encounter including the documentation in this note and/or discussed this patient with the care management provider. I will address any urgent items identified by the care management provider and will communicate my actions to the patient's PCP. I have reviewed the patient's CCM visit with my supervising attending, Dr Raines.  Tallula Grindle K Albeiro Trompeter, MD 04/30/2020   

## 2020-04-30 NOTE — Progress Notes (Signed)
Internal Medicine Clinic Attending  Case discussed with Dr. Chundi at the time of the visit.  We reviewed the resident's history and exam and pertinent patient test results.  I agree with the assessment, diagnosis, and plan of care documented in the resident's note. 

## 2020-04-30 NOTE — Patient Instructions (Signed)
Visit Information It was nice speaking with you today.  Goals Addressed            This Visit's Progress     Patient Stated   . " I don't like it when the pharmacy changes my pill packs w/o telling me.; it makes my blood pressure go up. Also, I'm trying to follow the diet they want me to follow but I don't know what to eat. I eat a lot of soup but I know it has a lot of salt." (pt-stated)       CARE PLAN ENTRY (see longitudinal plan of care for additional care plan information)  Current Barriers:  . Chronic Disease Management support and education needs related to HTN and CKD  and hyporeninemic hypoaldosteronism- met with patient in the clinic after he was seen by provider, his BP was 140/100 , he is amenable to monitoring his BP at home  Nurse Case Manager Clinical Goal(s):  [name] Over the next 30 days, patient will verbalize basic understanding of HTN , CKD and  hyporeninemic hypoaldosteronism disease process and self health management plan as evidenced by voicing understanding of any medication changes , taking medications as prescribed and following recommended diet  Interventions:  . Provided education to patient regarding blood pressure targets after reviewing his blood pressure reading in the clinic of 140/100 . Discussed beginning BP self monitoring at home . Collaborated with providers - order for blood pressure home monitor written in medication section so will ask covering provider to write another order for BP monitor . Once home BP monitor is secured, will teach patient how to use  Patient Self Care Activities:  . Self administers medications as prescribed . Attends all scheduled provider appointments . Calls provider office for new concerns or questions . Patient voices understanding of any medication changes and changes to pill packs . Patient voices understanding of diet recommendations . Unable to perform IADLs independently- uses [employer] for transportation  Please see  past updates related to this goal by clicking on the "Past Updates" button in the selected goal      . " I have a rash all over my body, even on my head and it itches and burns so bad I can't sleep." (pt-stated)       CARE PLAN ENTRY (see longitudinal plan of care for additional care plan information)  Current Barriers:  [name] Knowledge Deficits related to managing pruritic rash- patient states he is rash is the same , still very pruritic, says he has an appointment with a dermatologist on 05/17/20 and will see his kidney doctor tomorrow and he is going to ask him about the rash, he says he is still taking the benadryl and it allows him to sleep 3-4 hours at a time at night. He says he continues to use an over the counter anti-itch cream. Nurse Case Manager Clinical Goal(s):  [name] Over the next 30 days, patient will keep his appointment with the dermatologist to have body rash evaluated and treated.   Interventions:  . Inter-disciplinary care team collaboration (see longitudinal plan of care)- discussed plan of referral to dermatologist to assess and treat rash . Again cautioned patient that the antihistamine Benadryl recommended by Dr Maricela Bo on 04/23/20 may make him drowsy   Patient Self Care Activities:  . Patient verbalizes understanding of plan to make acute clinic appointment . Self administers medications as prescribed . Attends all scheduled provider appointments . Calls pharmacy for medication refills . Calls provider  office for new concerns or questions   Please see past updates related to this goal by clicking on the "Past Updates" button in the selected goal         The patient verbalized understanding of instructions provided today and declined a print copy of patient instruction materials.   The care management team will reach out to the patient again over the next 30 days.   Kelli Churn RN, CCM, [facility] RN Care Manager [phone]

## 2020-05-01 ENCOUNTER — Ambulatory Visit: Payer: Medicare Other

## 2020-05-01 DIAGNOSIS — N184 Chronic kidney disease, stage 4 (severe): Secondary | ICD-10-CM | POA: Diagnosis not present

## 2020-05-01 DIAGNOSIS — N400 Enlarged prostate without lower urinary tract symptoms: Secondary | ICD-10-CM | POA: Diagnosis not present

## 2020-05-01 DIAGNOSIS — I639 Cerebral infarction, unspecified: Secondary | ICD-10-CM | POA: Diagnosis not present

## 2020-05-01 DIAGNOSIS — I10 Essential (primary) hypertension: Secondary | ICD-10-CM

## 2020-05-01 DIAGNOSIS — N2581 Secondary hyperparathyroidism of renal origin: Secondary | ICD-10-CM | POA: Diagnosis not present

## 2020-05-01 DIAGNOSIS — D631 Anemia in chronic kidney disease: Secondary | ICD-10-CM | POA: Diagnosis not present

## 2020-05-01 NOTE — Progress Notes (Signed)
Internal Medicine Clinic Attending  CCM services provided by the care management provider and their documentation were reviewed with Dr. Marva Panda.  We reviewed the pertinent findings, urgent action items addressed by the resident and non-urgent items to be addressed by the PCP.  I agree with the assessment, diagnosis, and plan of care documented in the CCM and resident's note.  Oda Kilts, MD 05/01/2020

## 2020-05-01 NOTE — Chronic Care Management (AMB) (Signed)
  Care Management   Follow Up Note   05/01/2020 Name: Derrick Hendrix. MRN: [id number] DOB: [birth year]  Referred by: [name], MD Reason for referral : Care Coordination [facility])   Braydyn Schultes. is a 79 y.o. year old male who is a primary care patient of [name], [name], MD. The care management team was consulted for assistance with care management and care coordination needs.    Review of patient status, including review of consultants reports, relevant laboratory and other test results, and collaboration with appropriate care team members and the patient's provider was performed as part of comprehensive patient evaluation and provision of chronic care management services.    SDOH (Social Determinants of Health) assessments performed: No See Care Plan activities for detailed interventions related to [facility])     Advanced Directives: See Care Plan and Vynca application for related entries.   Goals Addressed            This Visit's Progress   . COMPLETED: "I have a lot of stray cats around my house that I can't take care of" (pt-stated)       Current Barriers:  [name] Knowledge Barriers related to resources and support available to assist with stray cats on property.    Case Manager Clinical Goal(s):  [name] Over the next 30 days, patient will work with BSW to address needs related to removing stray cats from property.  . Over the next 30 days, BSW will collaborate with RN Care Manager to address care management and care coordination needs  Interventions:   . Contacted patient to follow up on assistance provided by [facility].  Patient states that all cats that were able to be trapped by organization have been spayed/neutered.  Patient expressed appreciation for assistance provided.    . Patient Self Care Activities:  . Self administers medications as prescribed . Attends all scheduled provider appointments . Calls provider office for new concerns or  questions  Please see past updates related to this goal by clicking on the "Past Updates" button in the selected goal          The patient has been provided with contact information for the care management team and has been advised to call with any health related questions or concerns.     Ronn Melena, [place] Coordination Social Worker [place] [phone]

## 2020-05-01 NOTE — Progress Notes (Signed)
Internal Medicine Clinic Resident  I have personally reviewed this encounter including the documentation in this note and/or discussed this patient with the care management provider. I will address any urgent items identified by the care management provider and will communicate my actions to the patient's PCP. I have reviewed the patient's CCM visit with my supervising attending, Dr Raines.  Margareta Laureano, MD  Internal Medicine, PGY-1 05/01/2020    

## 2020-05-01 NOTE — Patient Instructions (Signed)
Visit Information  Goals Addressed            This Visit's Progress   . COMPLETED: "I have a lot of stray cats around my house that I can't take care of" (pt-stated)       Current Barriers:  [name] Knowledge Barriers related to resources and support available to assist with stray cats on property.    Case Manager Clinical Goal(s):  [name] Over the next 30 days, patient will work with BSW to address needs related to removing stray cats from property.  . Over the next 30 days, BSW will collaborate with RN Care Manager to address care management and care coordination needs  Interventions:   . Contacted patient to follow up on assistance provided by [facility].  Patient states that all cats that were able to be trapped by organization have been spayed/neutered.  Patient expressed appreciation for assistance provided.    . Patient Self Care Activities:  . Self administers medications as prescribed . Attends all scheduled provider appointments . Calls provider office for new concerns or questions  Please see past updates related to this goal by clicking on the "Past Updates" button in the selected goal         Patient verbalizes understanding of instructions provided today.   The patient has been provided with contact information for the care management team and has been advised to call with any health related questions or concerns.       Ronn Melena, [place] Coordination Social Worker [place] [phone]

## 2020-05-04 ENCOUNTER — Telehealth: Payer: Self-pay | Admitting: Internal Medicine

## 2020-05-04 DIAGNOSIS — L309 Dermatitis, unspecified: Secondary | ICD-10-CM | POA: Diagnosis not present

## 2020-05-04 NOTE — Telephone Encounter (Signed)
Do we have any notes from [place] derm about his rash?

## 2020-05-04 NOTE — Telephone Encounter (Signed)
Spoke with the patient this morning.  He has rec'd his Dermatology appt for today 05/04/2020 @ 9 am  and is being seen for his Rash.  Patient states he does not need to be seen at this time as [facility] is following his for his rash.

## 2020-05-04 NOTE — Telephone Encounter (Signed)
-----   Message from Lars Mage, MD sent at 05/01/2020  9:19 AM EDT ----- Called patient to discuss his blood work results.  All rheumatological test came back within normal limits.  Patient states that his rash to his continued to bother him.  Patient needs a follow-up appointment with [facility] if he cannot be seen with dermatology as it is possible.  Thank you!

## 2020-05-15 ENCOUNTER — Other Ambulatory Visit: Payer: Self-pay | Admitting: Internal Medicine

## 2020-05-15 DIAGNOSIS — I10 Essential (primary) hypertension: Secondary | ICD-10-CM

## 2020-05-15 MED ORDER — BLOOD PRESSURE KIT DEVI: 0 refills | Status: DC

## 2020-05-18 ENCOUNTER — Other Ambulatory Visit: Payer: Self-pay | Admitting: Internal Medicine

## 2020-05-18 DIAGNOSIS — L308 Other specified dermatitis: Secondary | ICD-10-CM | POA: Diagnosis not present

## 2020-05-18 DIAGNOSIS — L309 Dermatitis, unspecified: Secondary | ICD-10-CM | POA: Diagnosis not present

## 2020-05-18 DIAGNOSIS — D485 Neoplasm of uncertain behavior of skin: Secondary | ICD-10-CM | POA: Diagnosis not present

## 2020-05-28 ENCOUNTER — Other Ambulatory Visit: Payer: Self-pay | Admitting: Internal Medicine

## 2020-05-28 DIAGNOSIS — R339 Retention of urine, unspecified: Secondary | ICD-10-CM

## 2020-05-29 ENCOUNTER — Ambulatory Visit: Payer: Medicare Other | Admitting: *Deleted

## 2020-05-29 DIAGNOSIS — N183 Chronic kidney disease, stage 3 unspecified: Secondary | ICD-10-CM

## 2020-05-29 DIAGNOSIS — I10 Essential (primary) hypertension: Secondary | ICD-10-CM

## 2020-05-29 DIAGNOSIS — N184 Chronic kidney disease, stage 4 (severe): Secondary | ICD-10-CM

## 2020-05-29 DIAGNOSIS — I63532 Cerebral infarction due to unspecified occlusion or stenosis of left posterior cerebral artery: Secondary | ICD-10-CM

## 2020-05-29 DIAGNOSIS — E1121 Type 2 diabetes mellitus with diabetic nephropathy: Secondary | ICD-10-CM

## 2020-05-29 DIAGNOSIS — E1122 Type 2 diabetes mellitus with diabetic chronic kidney disease: Secondary | ICD-10-CM

## 2020-05-29 NOTE — Chronic Care Management (AMB) (Signed)
Chronic Care Management   Follow Up Note   05/29/2020 Name: Derrick Hendrix. MRN: [id number] DOB: [birth year]  Referred by: Yvette Rack, MD Reason for referral : Chronic Care Management (CKD, HTN, A Fib)   Derrick Hendrix. is a 79 y.o. year old male who is a primary care patient of [name], Hermina Staggers, MD. The CCM team was consulted for assistance with chronic disease management and care coordination needs.    Review of patient status, including review of consultants reports, relevant laboratory and other test results, and collaboration with appropriate care team members and the patient's provider was performed as part of comprehensive patient evaluation and provision of chronic care management services.    SDOH (Social Determinants of Health) assessments performed: No See Care Plan activities for detailed interventions related to [facility])     Outpatient Encounter Medications as of 05/29/2020  Medication Sig  . tamsulosin (FLOMAX) 0.4 MG CAPS capsule TAKE ONE CAPSULE BY MOUTH DAILY AFTER SUPPER  . acetaminophen (TYLENOL) 500 MG tablet Take 2 tablets (1,000 mg total) by mouth every 6 (six) hours as needed for mild pain (or Fever >/= 101).  [name] amLODipine (NORVASC) 10 MG tablet TAKE ONE TABLET BY MOUTH DAILY  . atorvastatin (LIPITOR) 80 MG tablet TAKE ONE TABLET BY MOUTH DAILY (Patient taking differently: Take 80 mg by mouth daily at 6 PM. )  . Blood Pressure Monitoring (BLOOD PRESSURE KIT) DEVI Please check blood pressure twice a day. In the morning and evening. Please record numbers on a sheet of paper. Blood pressure parameters are 120-150/80-90  . clonazePAM (KLONOPIN) 0.25 MG disintegrating tablet TAKE 1 TABLET (0.25 MG TOTAL) BY MOUTH THREE (THREE) TIMES DAILY.  [name] docusate sodium (COLACE) 100 MG capsule Take 1 capsule (100 mg total) by mouth daily.  [name] ELIQUIS 2.5 MG TABS tablet TAKE ONE TABLET BY MOUTH TWICE A DAY  . finasteride (PROSCAR) 5 MG tablet TAKE ONE TABLET BY MOUTH DAILY (Patient  taking differently: Take 5 mg by mouth daily. )  . fludrocortisone (FLORINEF) 0.1 MG tablet TAKE ONE TABLET BY MOUTH TWICE A DAY  . furosemide (LASIX) 20 MG tablet TAKE ONE TABLET BY MOUTH DAILY  . hydrALAZINE (APRESOLINE) 10 MG tablet TAKE ONE TABLET BY MOUTH THREE TIMES A DAY  . ipratropium (ATROVENT) 0.06 % nasal spray Place 2 sprays into the nose 3 (three) times daily. (Patient taking differently: Place 2 sprays into the nose daily as needed for rhinitis. )  . nitroGLYCERIN (NITROSTAT) 0.4 MG SL tablet Place 1 tablet (0.4 mg total) under the tongue every 5 (five) minutes x 3 doses as needed for chest pain.  . predniSONE (DELTASONE) 10 MG tablet   . SPS 15 GM/60ML suspension SMARTSIG:240 Milliliter(s) By Mouth Once  . triamcinolone cream (KENALOG) 0.1 % SMARTSIG:1 Application Topical 2-3 Times Daily   No facility-administered encounter medications on file as of 05/29/2020.     Objective:  BP Readings from Last 3 Encounters:  04/23/20 (!) 149/76  03/07/20 107/68  03/04/20 (!) 145/83   Lab Results  Component Value Date   HGBA1C 5.5 02/20/2020   HGBA1C 6.0 (A) 11/16/2019   HGBA1C 6.1 (A) 08/03/2019   Lab Results  Component Value Date   LDLCALC 95 03/03/2016   CREATININE 3.71 (H) 04/23/2020   Lab Results  Component Value Date   LABMICR 1,212.2 10/01/2018   LABMICR See below: 02/13/2017    Wt Readings from Last 3 Encounters:  04/23/20 208 lb 14.4 oz (94.8 kg)  03/07/20  202 lb 6.4 oz (91.8 kg)  03/04/20 205 lb 14.6 oz (93.4 kg)   Lab Results  Component Value Date   CHOL 149 03/03/2016   HDL 41 03/03/2016   LDLCALC 95 03/03/2016   TRIG 65 03/03/2016   CHOLHDL 3.6 03/03/2016   please update lipid panel and urine for protein   Goals Addressed              This Visit's Progress     Patient Stated   .  " I have a rash all over my body, even on my head and it itches and burns so bad I can't sleep." (pt-stated)        CARE PLAN ENTRY (see longitudinal plan of care  for additional care plan information)  Current Barriers:  [name] Knowledge Deficits related to managing pruritic rash- patient states saw a dermatologist earlier this month and was prescribed a salve, and he described what sounded like a prednisone taper, he states he is very anxious and upset because he doesn't understand his medicine regimen and is afraid he is going "to overdose', he also says the total body rash remains very itchy, with excessive flaking of his skin, he says he stays cold all the time and is having chills and only sweats"from my head", he says he doesn't know if he has a fever because he does not have a thermometer, he says he feels very tired all the time and is sleeping excessively. he says he knows some of the sleepiness is due to the benadryl but he is still concerned that he sleeps too much, he says he wants a clinic appointment Nurse Case Manager Clinical Goal(s):  [name] Over the next 30 days, patient will keep his appointment with all providers  Interventions:  . Inter-disciplinary care team collaboration (see longitudinal plan of care)- discussed plan with patient to have front desk staff call [facility] to arrange clinic appointment . Messaged Charsetta at the clinic front desk and requested she call patient to schedule an appointment    Patient Self Care Activities:  . Patient verbalizes understanding of plan to make acute clinic appointment . Self administers medications as prescribed . Attends all scheduled provider appointments . Calls pharmacy for medication refills . Calls provider office for new concerns or questions   Please see past updates related to this goal by clicking on the "Past Updates" button in the selected goal          Plan:   The care management team will reach out to the patient again over the next 30 days.   Kelli Churn RN, CCM, [facility] RN Care Manager [phone]

## 2020-05-29 NOTE — Addendum Note (Signed)
Addended byDewayne Hatch on: 05/29/2020 03:59 PM   Modules accepted: Orders

## 2020-05-29 NOTE — Addendum Note (Signed)
Addended byDewayne Hatch on: 05/29/2020 04:13 PM   Modules accepted: Orders

## 2020-05-29 NOTE — Progress Notes (Addendum)
Internal Medicine Clinic Resident  I have personally reviewed this encounter including the documentation in this note and/or discussed this patient with the care management provider. I will address any urgent items identified by the care management provider and will communicate my actions to the patient's PCP. I have reviewed the patient's CCM visit with my supervising attending, Dr Philipp Ovens.  Agree with scheduling an appointment with PCP or [facility] to assess his symptoms.  Also placed future order for lipid panel and urine micro alb.  Dewayne Hatch, MD 05/29/2020

## 2020-05-29 NOTE — Progress Notes (Signed)
Internal Medicine Clinic Attending  CCM services provided by the care management provider and their documentation were discussed with Dr. Masoudi. We reviewed the pertinent findings, urgent action items addressed by the resident and non-urgent items to be addressed by the PCP.  I agree with the assessment, diagnosis, and plan of care documented in the CCM and resident's note.  Morene Cecilio, MD 05/29/2020 

## 2020-05-29 NOTE — Patient Instructions (Signed)
Visit Information It was nice speaking with you today I will have someone from the clinic call you to make an appointment  Goals Addressed              This Visit's Progress     Patient Stated   .  " I have a rash all over my body, even on my head and it itches and burns so bad I can't sleep." (pt-stated)        CARE PLAN ENTRY (see longitudinal plan of care for additional care plan information)  Current Barriers:  [name] Knowledge Deficits related to managing pruritic rash- patient states saw a dermatologist earlier this month and was prescribed a salve, and he described what sounded like a prednisone taper, he states he is very anxious and upset because he doesn't understand his medicine regimen and is afraid he is going "to overdose', he also says the total body rash remains very itchy, with excessive flaking of his skin, he says he stays cold all the time and is having chills and only sweats"from my head", he says he doesn't know if he has a fever because he does not have a thermometer, he says he feels very tired all the time and is sleeping excessively. he says he knows some of the sleepiness is due to the benadryl but he is still concerned that he sleeps too much, he says he wants a clinic appointment Nurse Case Manager Clinical Goal(s):  [name] Over the next 30 days, patient will keep his appointment with all providers  Interventions:  . Inter-disciplinary care team collaboration (see longitudinal plan of care)- discussed plan with patient to have front desk staff call patent to arrange clinic appointment . Messaged Charsetta at the clinic front desk and requested she call patient to schedule an appointment    Patient Self Care Activities:  . Patient verbalizes understanding of plan to make acute clinic appointment . Self administers medications as prescribed . Attends all scheduled provider appointments . Calls pharmacy for medication refills . Calls provider office for new concerns  or questions   Please see past updates related to this goal by clicking on the "Past Updates" button in the selected goal         The patient verbalized understanding of instructions provided today and declined a print copy of patient instruction materials.   The care management team will reach out to the patient again over the next 30 days.   Kelli Churn RN, CCM, [facility] RN Care Manager [phone]

## 2020-05-31 ENCOUNTER — Other Ambulatory Visit (HOSPITAL_COMMUNITY): Payer: Self-pay | Admitting: Nurse Practitioner

## 2020-06-01 ENCOUNTER — Telehealth: Payer: Medicare Other

## 2020-06-04 ENCOUNTER — Ambulatory Visit: Payer: Medicare Other | Admitting: *Deleted

## 2020-06-04 ENCOUNTER — Ambulatory Visit (INDEPENDENT_AMBULATORY_CARE_PROVIDER_SITE_OTHER): Payer: Medicare Other | Admitting: Internal Medicine

## 2020-06-04 ENCOUNTER — Other Ambulatory Visit: Payer: Self-pay

## 2020-06-04 VITALS — BP 166/76 | HR 91 | Temp 98.4°F | Ht 74.0 in | Wt 220.5 lb

## 2020-06-04 DIAGNOSIS — I10 Essential (primary) hypertension: Secondary | ICD-10-CM

## 2020-06-04 DIAGNOSIS — L409 Psoriasis, unspecified: Secondary | ICD-10-CM | POA: Diagnosis not present

## 2020-06-04 DIAGNOSIS — N184 Chronic kidney disease, stage 4 (severe): Secondary | ICD-10-CM

## 2020-06-04 NOTE — Chronic Care Management (AMB) (Signed)
Chronic Care Management   Follow Up Note   06/04/2020 Name: Derrick Hendrix. MRN: [id number] DOB: 09/29/1941  Referred by: [name], MD Reason for referral : Chronic Care Management (DM, HTN, CKD)   Derrick Hendrix. is a 79 y.o. year old male who is a primary care patient of [name], [name], MD. The CCM team was consulted for assistance with chronic disease management and care coordination needs.    Review of patient status, including review of consultants reports, relevant laboratory and other test results, and collaboration with appropriate care team members and the patient's provider was performed as part of comprehensive patient evaluation and provision of chronic care management services.    SDOH (Social Determinants of Health) assessments performed: No See Care Plan activities for detailed interventions related to [facility])     Outpatient Encounter Medications as of 06/04/2020  Medication Sig  . tamsulosin (FLOMAX) 0.4 MG CAPS capsule TAKE ONE CAPSULE BY MOUTH DAILY AFTER SUPPER  . acetaminophen (TYLENOL) 500 MG tablet Take 2 tablets (1,000 mg total) by mouth every 6 (six) hours as needed for mild pain (or Fever >/= 101).  [name] amLODipine (NORVASC) 10 MG tablet TAKE ONE TABLET BY MOUTH DAILY  . atorvastatin (LIPITOR) 80 MG tablet TAKE ONE TABLET BY MOUTH DAILY (Patient taking differently: Take 80 mg by mouth daily at 6 PM. )  . Blood Pressure Monitoring (BLOOD PRESSURE KIT) DEVI Please check blood pressure twice a day. In the morning and evening. Please record numbers on a sheet of paper. Blood pressure parameters are 120-150/80-90  . clonazePAM (KLONOPIN) 0.25 MG disintegrating tablet TAKE 1 TABLET (0.25 MG TOTAL) BY MOUTH THREE (THREE) TIMES DAILY.  [name] docusate sodium (COLACE) 100 MG capsule Take 1 capsule (100 mg total) by mouth daily.  [name] ELIQUIS 2.5 MG TABS tablet TAKE ONE TABLET BY MOUTH TWICE A DAY  . finasteride (PROSCAR) 5 MG tablet TAKE ONE TABLET BY MOUTH DAILY (Patient  taking differently: Take 5 mg by mouth daily. )  . fludrocortisone (FLORINEF) 0.1 MG tablet TAKE ONE TABLET BY MOUTH TWICE A DAY  . furosemide (LASIX) 20 MG tablet TAKE ONE TABLET BY MOUTH DAILY  . hydrALAZINE (APRESOLINE) 10 MG tablet TAKE ONE TABLET BY MOUTH THREE TIMES A DAY  . ipratropium (ATROVENT) 0.06 % nasal spray Place 2 sprays into the nose 3 (three) times daily. (Patient taking differently: Place 2 sprays into the nose daily as needed for rhinitis. )  . nitroGLYCERIN (NITROSTAT) 0.4 MG SL tablet Place 1 tablet (0.4 mg total) under the tongue every 5 (five) minutes x 3 doses as needed for chest pain.  . predniSONE (DELTASONE) 10 MG tablet   . SPS 15 GM/60ML suspension SMARTSIG:240 Milliliter(s) By Mouth Once  . triamcinolone cream (KENALOG) 0.1 % SMARTSIG:1 Application Topical 2-3 Times Daily   No facility-administered encounter medications on file as of 06/04/2020.     Objective:  BP Readings from Last 3 Encounters:  06/04/20 (!) 166/76  04/23/20 (!) 149/76  03/07/20 107/68   Wt Readings from Last 3 Encounters:  06/04/20 220 lb 8 oz (100 kg)  04/23/20 208 lb 14.4 oz (94.8 kg)  03/07/20 202 lb 6.4 oz (91.8 kg)   Lab Results  Component Value Date   HGBA1C 5.5 02/20/2020   HGBA1C 6.0 (A) 11/16/2019   HGBA1C 6.1 (A) 08/03/2019   Lab Results  Component Value Date   LDLCALC 95 03/03/2016   CREATININE 3.71 (H) 04/23/2020   Lab Results  Component Value  Date   LABMICR 1,212.2 10/01/2018   LABMICR See below: 02/13/2017      Goals Addressed              This Visit's Progress     Patient Stated   .  " I don't like it when the pharmacy changes my pill packs w/o telling me.; it makes my blood pressure go up. Also, I'm trying to follow the diet they want me to follow but I don't know what to eat. I eat a lot of soup but I know it has a lot of salt." (pt-stated)        CARE PLAN ENTRY (see longitudinal plan of care for additional care plan information)  Current  Barriers:  . Chronic Disease Management support and education needs related to HTN and CKD  and hyporeninemic hypoaldosteronism- met with patient in the clinic after he was seen by provider, his BP was 166/76 , he is amenable to monitoring his BP at home if he can get a monitor at no cost, office note of today indicates patient was not taking his hydralazine so he was instructed to take it 3 x daily as prescribed  Nurse Case Manager Clinical Goal(s):  [name] Over the next 30- 60 days, patient will verbalize basic understanding of HTN , CKD and  hyporeninemic hypoaldosteronism disease process and self health management plan as evidenced by voicing understanding of any medication changes , taking medications as prescribed and following recommended diet  Interventions:  . Provided education to patient regarding blood pressure targets after reviewing his blood pressure reading in the clinic of 167/ . Discussed beginning BP self monitoring at home . Collaborated with providers - order for home blood pressure monitor secured but he has both Medicare and Medicaid so monitor is not covered by Medicaid since it is the secondary payor . If home BP monitor can be secured, will teach patient how to use at next clinic visit  Patient Self Care Activities:  . Self administers medications as prescribed . Attends all scheduled provider appointments . Calls provider office for new concerns or questions . Patient voices understanding of any medication changes and changes to pill packs . Patient voices understanding of diet recommendations . Unable to perform IADLs independently- uses [employer] for transportation  Please see past updates related to this goal by clicking on the "Past Updates" button in the selected goal      .  " I have a rash all over my body, even on my head and it itches and burns so bad I can't sleep." (pt-stated)        CARE PLAN ENTRY (see longitudinal plan of care for additional care plan  information)  Current Barriers:  [name] Knowledge Deficits related to managing pruritic rash- met with patient after his clinic appointment, he says the rash still itches intensely and he cannot get to his back to apply the salve, he says sometimes his niece will come over and help him and he has offered to pay family and or friends $20 to apply the cream to his back but they refuse and he says people have told him they are afraid the rash is contagious, patient says he will make a follow up appointment with the dermatologist as he had a biopsy and is suppose to go back for the results .  Nurse Case Manager Clinical Goal(s):  [name] Over the next 30 -60 days, patient will keep his appointment with all providers  Interventions:  .  Inter-disciplinary care team collaboration (see longitudinal plan of care)- discussed plan with patient to have front desk staff call patent to arrange clinic appointment . Reviewed office visit notes of today and discussed with patient . Applied lotion to patient's back . Again discussed personal care services with patient    Patient Self Care Activities:  . Patient verbalizes understanding of plan to make acute clinic appointment . Self administers medications as prescribed . Attends all scheduled provider appointments . Calls pharmacy for medication refills . Calls provider office for new concerns or questions   Please see past updates related to this goal by clicking on the "Past Updates" button in the selected goal          Plan:   The care management team will reach out to the patient again over the next 30-60 days.    Kelli Churn RN, CCM, [facility] RN Care Manager [phone]

## 2020-06-04 NOTE — Assessment & Plan Note (Signed)
Patient is prescribed amlodipine 10 mg daily and hydralazine 10 mg TID. He reports that he was not sure if he was supposed to be taking the hydralazine so he has not been taking it. BP today is elevated at 182/77, repeat was 166/76. BP not controlled, advised to resume his hydralazine. BMP on last visit showed Cr 3.7, GFR 17.   -Continue amlodipine 10 mg daily -Resume hydralazine 10 mg TID -RTC in 1 month

## 2020-06-04 NOTE — Assessment & Plan Note (Addendum)
Patient was seen on 04/23/20 for this generalized rash that started after his nephrologist appointment, initially started on his left arm and gradually progressed and now is diffuse then on his back, bilateral arms, lower extremities, and scalp. There was initially concerns about drug induced lupus from hydralazine. He had a BMP, CBC, ANA, double-stranded DNA, anti-Ro, anti-La, and anti-RNP test done which were negative. Denies weight changes, fevers, chills, night sweats, abdominal pain, diarrhea, constipation, or any other symptoms. He denies any family history of any cancer.   He reports that it initially started on his left arm and progressed to involve his whole body, it is very itchy, denies any pain or drainage.  He reports that he has seen dermatology and was given a cream and is on a prednisone taper.  He reports that he does not feel that this is helping.  He does endorse taking Benadryl at night which helps him sleep.  He also has tried baby oil which she reports helped at first however once it went away the itchiness became worse.  We have obtained records from dermatology, he was seen by them on 6/4 and seems to have had a biopsy done, this resulted spongiotic psoriasiform dermatitis.  He has been prescribed triamcinolone cream and a prednisone taper and recommended follow-up after the pathology resulted.  Discussed this with patient, advised patient to continue the steroids and the triamcinolone cream. Advised to make follow-up appointment with dermatology.  Also advised that patient can take either Benadryl or a less sedating antihistamine during the day to help with his itchiness. Given diffuse nature he may benefit from a biologic agent. He is in agreement with this plan.   -Continue prednisone taper, and the triamcinolone cream -F/u with dermatology

## 2020-06-04 NOTE — Progress Notes (Signed)
Internal Medicine Clinic Resident  I have personally reviewed this encounter including the documentation in this note and/or discussed this patient with the care management provider. I will address any urgent items identified by the care management provider and will communicate my actions to the patient's PCP. I have reviewed the patient's CCM visit with my supervising attending, Dr Evette Doffing.  Jose Persia, MD 06/04/2020

## 2020-06-04 NOTE — Patient Instructions (Signed)
Visit Information  It was goo to see you today. Please remember to take your hydralazine 3 times a day.  Goals Addressed              This Visit's Progress     Patient Stated   .  " I don't like it when the pharmacy changes my pill packs w/o telling me.; it makes my blood pressure go up. Also, I'm trying to follow the diet they want me to follow but I don't know what to eat. I eat a lot of soup but I know it has a lot of salt." (pt-stated)        CARE PLAN ENTRY (see longitudinal plan of care for additional care plan information)  Current Barriers:  . Chronic Disease Management support and education needs related to HTN and CKD  and hyporeninemic hypoaldosteronism- met with patient in the clinic after he was seen by provider, his BP was 166/76 , he is amenable to monitoring his BP at home if he can get a monitor at no cost, office note of today indicates patient was not taking his hydralazine so he was instructed to take it 3 x daily as prescribed  Nurse Case Manager Clinical Goal(s):  . Over the next 30- 60 days, patient will verbalize basic understanding of HTN , CKD and  hyporeninemic hypoaldosteronism disease process and self health management plan as evidenced by voicing understanding of any medication changes , taking medications as prescribed and following recommended diet  Interventions:  . Provided education to patient regarding blood pressure targets after reviewing his blood pressure reading in the clinic of 167/ . Discussed beginning BP self monitoring at home . Collaborated with providers - order for home blood pressure monitor secured but he has both Medicare and Medicaid so monitor is not covered by Medicaid since it is the secondary payor . If home BP monitor can be secured, will teach patient how to use at next clinic visit  Patient Self Care Activities:  . Self administers medications as prescribed . Attends all scheduled provider appointments . Calls provider office  for new concerns or questions . Patient voices understanding of any medication changes and changes to pill packs . Patient voices understanding of diet recommendations . Unable to perform IADLs independently- uses [employer] for transportation  Please see past updates related to this goal by clicking on the "Past Updates" button in the selected goal      .  " I have a rash all over my body, even on my head and it itches and burns so bad I can't sleep." (pt-stated)        CARE PLAN ENTRY (see longitudinal plan of care for additional care plan information)  Current Barriers:  . Knowledge Deficits related to managing pruritic rash- met with patient after his clinic appointment, he says the rash still itches intensely and he cannot get to his back to apply the salve, he says sometimes his niece will come over and help him and he has offered to pay family and or friends $20 to apply the cream to his back but they refuse and he says people have told him they are afraid the rash is contagious, patient says he will make a follow up appointment with the dermatologist as he had a biopsy and is suppose to go back for the results .  Nurse Case Manager Clinical Goal(s):  . Over the next 30 -60 days, patient will keep his appointment with all providers    Interventions:  . Inter-disciplinary care team collaboration (see longitudinal plan of care)- discussed plan with patient to have front desk staff call [employer] to arrange clinic appointment . Reviewed office visit notes of today and discussed with patient . Applied lotion to patient's back . Again discussed personal care services with patient    Patient Self Care Activities:  . Patient verbalizes understanding of plan to make acute clinic appointment . Self administers medications as prescribed . Attends all scheduled provider appointments . Calls pharmacy for medication refills . Calls provider office for new concerns or questions   Please see past  updates related to this goal by clicking on the "Past Updates" button in the selected goal         The patient verbalized understanding of instructions provided today and declined a print copy of patient instruction materials.   The care management team will reach out to the patient again over the next 30-60 days.     RN, CCM, [facility] RN Care Manager [phone]  

## 2020-06-04 NOTE — Progress Notes (Signed)
   CC: Rash and HTN  HPI:  [name]. is a 79 y.o. with a history listed below presenting for follow-up of his generalized rash and hypertension.  Past Medical History:  Diagnosis Date  . Abscess of foot without toes, left 08/28/2017  . Chronic kidney disease (CKD), stage III (moderate)   . Constipation 11/07/2014  . Daily headache    "lately" (04/17/2016)  . Depression   . Dizziness 03/07/2016  . Headache 06/26/2014  . Heart murmur   . High cholesterol   . Hyperkalemia 08/2014  . Hyperkalemia 10/20/2014  . Hyperkalemia 10/05/2018  . Hyperlipemia 11/11/2016  . Hypertension   . Neuropathic pain 02/06/2015  . Osteomyelitis ([place])   . PAD (peripheral artery disease) ([place])   . Paronychia of great toe, right   . Preop cardiovascular exam   . Right foot pain 02/09/2017  . Stable angina ([place]) 06/27/2014  . Stroke ([place]) 03/2016   hx of PAD; j"ust lots of headaches since" (04/17/2016)  . Vitamin B12 deficiency 06/27/2014  . Weight loss, unintentional 06/27/2014   Review of Systems:   Constitutional: Negative for chills and fever.  Respiratory: Negative for shortness of breath.   Cardiovascular: Negative for chest pain and leg swelling.  Gastrointestinal: Negative for abdominal pain, nausea and vomiting.  Skin: Positive for diffuse rash and itchiness. Neurological: Negative for dizziness and headaches.   Physical Exam:  Vitals:   06/04/20 0834  BP: (!) 182/77  Pulse: 97  Temp: 98.4 F (36.9 C)  TempSrc: Oral  SpO2: 97%  Weight: 220 lb 8 oz (100 kg)  Height: 6\' 2"  (1.88 m)   Physical Exam Constitutional:      Appearance: Normal appearance.  HENT:     Head: Normocephalic and atraumatic.  Cardiovascular:     Rate and Rhythm: Normal rate and regular rhythm.     Pulses: Normal pulses.     Heart sounds: Normal heart sounds. No murmur heard.   Pulmonary:     Effort: Pulmonary effort is normal. No respiratory distress.     Breath sounds: Normal breath sounds.    Abdominal:     General: Abdomen is flat. Bowel sounds are normal.     Palpations: Abdomen is soft.  Musculoskeletal:     Cervical back: Normal range of motion and neck supple.  Skin:    General: Skin is warm and dry.     Capillary Refill: Capillary refill takes less than 2 seconds.     Comments: Diffuse, flaky, annular rash on bilateral upper extremities and back, multiple hyperpigmented lesions on upper back, dry, flaky scalp  Neurological:     General: No focal deficit present.     Mental Status: He is alert and oriented to person, place, and time.  Psychiatric:     Comments: Intermittent agitation     Assessment & Plan:   See Encounters Tab for problem based charting.  Patient discussed with Dr. Philipp Ovens

## 2020-06-04 NOTE — Patient Instructions (Addendum)
Mr. Yitzhak Awan.,  It was a pleasure to see you today. Thank you for coming in.   Today we discussed your rash and itchiness. I am sorry that you are still not feeling well. We are getting records from the dermatology office. Please continue taking the prednisone and using the cream. You can continue taking Benadryl at night to help with the itchiness. You can start using another antihistamine during the day, such as Zytec, Claritin, or Allergra to see if that helps with the itchiness.    We also discussed your blood pressure. It was high today. Please start taking the Hydralazine again, this is taken three times per day.   Please return to clinic in 1 month or sooner if needed.   Thank you again for coming in.   Asencion Noble.D.

## 2020-06-05 NOTE — Progress Notes (Signed)
Internal Medicine Clinic Attending  Case discussed with Dr. Krienke at the time of the visit.  We reviewed the resident's history and exam and pertinent patient test results.  I agree with the assessment, diagnosis, and plan of care documented in the resident's note.    

## 2020-06-05 NOTE — Progress Notes (Signed)
Internal Medicine Clinic Attending  CCM services provided by the care management provider and their documentation were discussed with Dr. Charleen Kirks. We reviewed the pertinent findings, urgent action items addressed by the resident and non-urgent items to be addressed by the PCP.  I agree with the assessment, diagnosis, and plan of care documented in the CCM and resident's note.  Axel Filler, MD 06/05/2020

## 2020-06-29 DIAGNOSIS — N184 Chronic kidney disease, stage 4 (severe): Secondary | ICD-10-CM | POA: Diagnosis not present

## 2020-06-29 DIAGNOSIS — N189 Chronic kidney disease, unspecified: Secondary | ICD-10-CM | POA: Diagnosis not present

## 2020-07-02 ENCOUNTER — Other Ambulatory Visit: Payer: Self-pay | Admitting: Internal Medicine

## 2020-07-02 DIAGNOSIS — I10 Essential (primary) hypertension: Secondary | ICD-10-CM

## 2020-07-03 ENCOUNTER — Ambulatory Visit: Payer: Self-pay | Admitting: *Deleted

## 2020-07-03 ENCOUNTER — Telehealth: Payer: Medicare Other

## 2020-07-03 NOTE — Chronic Care Management (AMB) (Signed)
  Chronic Care Management   Outreach Note  07/03/2020 Name: Derrick Hendrix. MRN: [id number] DOB: [birth year]  Referred by: [name], MD Reason for referral : Chronic Care Management (DM, HTN, CKD, A FIb)   An unsuccessful telephone outreach was attempted today. The patient was referred to the case management team for assistance with care management and care coordination. Left message requesting return call and also requested he make follow up appointment with clinic for blood pressure check.  Follow Up Plan: If no return call from patient, this CCM RN will reach out to the patient again over the next 7-14 days.   Kelli Churn RN, CCM, [facility] RN Care Manager [phone]

## 2020-07-10 ENCOUNTER — Ambulatory Visit: Payer: Medicare HMO | Admitting: *Deleted

## 2020-07-10 DIAGNOSIS — N183 Chronic kidney disease, stage 3 unspecified: Secondary | ICD-10-CM

## 2020-07-10 DIAGNOSIS — N184 Chronic kidney disease, stage 4 (severe): Secondary | ICD-10-CM

## 2020-07-10 DIAGNOSIS — I10 Essential (primary) hypertension: Secondary | ICD-10-CM

## 2020-07-10 NOTE — Patient Instructions (Signed)
Visit Information I am sorry you ares till suffering with your rash. Please call the clinic and make an appointment if it continues to bother you.  Goals Addressed              This Visit's Progress     Patient Stated   .  " I don't like it when the pharmacy changes my pill packs w/o telling me.; it makes my blood pressure go up. Also, I'm trying to follow the diet they want me to follow but I don't know what to eat. I eat a lot of soup but I know it has a lot of salt." (pt-stated)        CARE PLAN ENTRY (see longitudinal plan of care for additional care plan information)  Current Barriers:  . Chronic Disease Management support and education needs related to HTN and CKD  and hyporeninemic hypoaldosteronism- states he is taking his medications as prescribed, doe not have a home blood pressure monitor .  Nurse Case Manager Clinical Goal(s):  [name] Over the next 30- 60 days, patient will verbalize basic understanding of HTN , CKD and  hyporeninemic hypoaldosteronism disease process and self health management plan as evidenced by voicing understanding of any medication changes , taking medications as prescribed and following recommended diet  Interventions:  . Appropriate assessments  completed  . Assessed medication taking behavior . Provided education to patient regarding strategies to control HTN . Offered to make follow up clinic appointment . Reviewed upcoming appointments and ensured he has transportation  Patient Self Care Activities:  . Self administers medications as prescribed . Attends all scheduled provider appointments . Calls provider office for new concerns or questions . Patient voices understanding of any medication changes and changes to pill packs . Patient voices understanding of diet recommendations . Unable to perform IADLs independently- uses [employer] for transportation  Please see past updates related to this goal by clicking on the "Past Updates" button in the  selected goal      .  " I have a rash all over my body, even on my head and it itches and burns so bad I can't sleep." (pt-stated)        CARE PLAN ENTRY (see longitudinal plan of care for additional care plan information)  Current Barriers:  [name] Knowledge Deficits related to managing pruritic rash- patient says his rash is about the same, still very pruritic and excessive skin flaking, said he followed up with the dermatologist but he doesn't think the "suave" he was given is helping, says the medicine he takes for itching makes him sleepy but he needs it to relieve the itching even though it only helps a little, he says he would like to get in the bathtub and soak but he's afraid he would not be able to get out of the bathtub once he got in, he says he sees the kidney doctor frequently and they take his blood pressure so he declined the offer of making a follow up appointment at the clinic, he says he paid a friend of his niece's to come to house and rub the lotion on his back but she only did it one time and he thinks it's because she though his rash might be contagious, he continues to deny the offer of securing PCS for help at home with ADL's .  Nurse Case Manager Clinical Goal(s):  [name] Over the next 30 -60 days, patient will keep his appointment with all providers  Interventions:  .  Inter-disciplinary care team collaboration (see longitudinal plan of care) . Appropriate assessments completed . Again discussed personal care services with patient    Patient Self Care Activities:  . Patient verbalizes understanding of plan to make acute clinic appointment . Self administers medications as prescribed . Attends all scheduled provider appointments . Calls pharmacy for medication refills . Calls provider office for new concerns or questions   Please see past updates related to this goal by clicking on the "Past Updates" button in the selected goal         The patient verbalized  understanding of instructions provided today and declined a print copy of patient instruction materials.   The care management team will reach out to the patient again over the next 30-60 days.    Kelli Churn RN, CCM, [facility] RN Care Manager [phone]

## 2020-07-10 NOTE — Chronic Care Management (AMB) (Signed)
Chronic Care Management   Follow Up Note   07/10/2020 Name: Derrick Hendrix. MRN: [id number] DOB: 05/01/1941  Referred by: Derrick Rosenthal, MD Reason for referral : Chronic Care Management (DM, A fib, CKD, HTN)   Derrick Hendrix. is a 79 y.o. year old male who is a primary care patient of Derrick Hendrix, Derrick Kluver, MD. The CCM team was consulted for assistance with chronic disease management and care coordination needs.    Review of patient status, including review of consultants reports, relevant laboratory and other test results, and collaboration with appropriate care team members and the patient's provider was performed as part of comprehensive patient evaluation and provision of chronic care management services.    SDOH (Social Determinants of Health) assessments performed: No See Care Plan activities for detailed interventions related to [facility])     Outpatient Encounter Medications as of 07/10/2020  Medication Sig  . hydrALAZINE (APRESOLINE) 10 MG tablet TAKE ONE TABLET BY MOUTH THREE TIMES A DAY  . tamsulosin (FLOMAX) 0.4 MG CAPS capsule TAKE ONE CAPSULE BY MOUTH DAILY AFTER SUPPER  . acetaminophen (TYLENOL) 500 MG tablet Take 2 tablets (1,000 mg total) by mouth every 6 (six) hours as needed for mild pain (or Fever >/= 101).  [name] amLODipine (NORVASC) 10 MG tablet TAKE ONE TABLET BY MOUTH DAILY  . atorvastatin (LIPITOR) 80 MG tablet TAKE ONE TABLET BY MOUTH DAILY (Patient taking differently: Take 80 mg by mouth daily at 6 PM. )  . Blood Pressure Monitoring (BLOOD PRESSURE KIT) DEVI Please check blood pressure twice a day. In the morning and evening. Please record numbers on a sheet of paper. Blood pressure parameters are 120-150/80-90  . clonazePAM (KLONOPIN) 0.25 MG disintegrating tablet TAKE 1 TABLET (0.25 MG TOTAL) BY MOUTH THREE (THREE) TIMES DAILY.  [name] docusate sodium (COLACE) 100 MG capsule Take 1 capsule (100 mg total) by mouth daily.  [name] ELIQUIS 2.5 MG TABS tablet TAKE ONE TABLET BY MOUTH  TWICE A DAY  . finasteride (PROSCAR) 5 MG tablet TAKE ONE TABLET BY MOUTH DAILY (Patient taking differently: Take 5 mg by mouth daily. )  . fludrocortisone (FLORINEF) 0.1 MG tablet TAKE ONE TABLET BY MOUTH TWICE A DAY  . furosemide (LASIX) 20 MG tablet TAKE ONE TABLET BY MOUTH DAILY  . ipratropium (ATROVENT) 0.06 % nasal spray Place 2 sprays into the nose 3 (three) times daily. (Patient taking differently: Place 2 sprays into the nose daily as needed for rhinitis. )  . nitroGLYCERIN (NITROSTAT) 0.4 MG SL tablet Place 1 tablet (0.4 mg total) under the tongue every 5 (five) minutes x 3 doses as needed for chest pain.  . predniSONE (DELTASONE) 10 MG tablet   . SPS 15 GM/60ML suspension SMARTSIG:240 Milliliter(s) By Mouth Once  . triamcinolone cream (KENALOG) 0.1 % SMARTSIG:1 Application Topical 2-3 Times Daily   No facility-administered encounter medications on file as of 07/10/2020.     Objective:  BP Readings from Last 3 Encounters:  06/04/20 (!) 166/76  04/23/20 (!) 149/76  03/07/20 107/68   Wt Readings from Last 3 Encounters:  06/04/20 220 lb 8 oz (100 kg)  04/23/20 208 lb 14.4 oz (94.8 kg)  03/07/20 202 lb 6.4 oz (91.8 kg)   Lab Results  Component Value Date   CREATININE 3.71 (H) 04/23/2020   BUN 58 (H) 04/23/2020   NA 145 (H) 04/23/2020   K 5.5 (H) 04/23/2020   CL 112 (H) 04/23/2020   CO2 16 (L) 04/23/2020    Goals Addressed  This Visit's Progress     Patient Stated   .  " I don't like it when the pharmacy changes my pill packs w/o telling me.; it makes my blood pressure go up. Also, I'm trying to follow the diet they want me to follow but I don't know what to eat. I eat a lot of soup but I know it has a lot of salt." (pt-stated)        CARE PLAN ENTRY (see longitudinal plan of care for additional care plan information)  Current Barriers:  . Chronic Disease Management support and education needs related to HTN and CKD  and hyporeninemic hypoaldosteronism-  states he is taking his medications as prescribed, doe not have a home blood pressure monitor .  Nurse Case Manager Clinical Goal(s):  [name] Over the next 30- 60 days, patient will verbalize basic understanding of HTN , CKD and  hyporeninemic hypoaldosteronism disease process and self health management plan as evidenced by voicing understanding of any medication changes , taking medications as prescribed and following recommended diet  Interventions:  . Appropriate assessments  completed  . Assessed medication taking behavior . Provided education to patient regarding strategies to control HTN . Offered to make follow up clinic appointment . Reviewed upcoming appointments and ensured he has transportation  Patient Self Care Activities:  . Self administers medications as prescribed . Attends all scheduled provider appointments . Calls provider office for new concerns or questions . Patient voices understanding of any medication changes and changes to pill packs . Patient voices understanding of diet recommendations . Unable to perform IADLs independently- uses [employer] for transportation  Please see past updates related to this goal by clicking on the "Past Updates" button in the selected goal      .  " I have a rash all over my body, even on my head and it itches and burns so bad I can't sleep." (pt-stated)        CARE PLAN ENTRY (see longitudinal plan of care for additional care plan information)  Current Barriers:  [name] Knowledge Deficits related to managing pruritic rash- patient says his rash is about the same, still very pruritic and excessive skin flaking, said he followed up with the dermatologist but he doesn't think the "suave" he was given is helping, says the medicine he takes for itching makes him sleepy but he needs it to relieve the itching even though it only helps a little, he says he would like to get in the bathtub and soak but he's afraid he would not be able to get out of the  bathtub once he got in, he says he sees the kidney doctor frequently and they take his blood pressure so he declined the offer of making a follow up appointment at the clinic, he says he paid a friend of his niece's to come to house and rub the lotion on his back but she only did it one time and he thinks it's because she though his rash might be contagious, he continues to deny the offer of securing PCS for help at home with ADL's .  Nurse Case Manager Clinical Goal(s):  [name] Over the next 30 -60 days, patient will keep his appointment with all providers  Interventions:  . Inter-disciplinary care team collaboration (see longitudinal plan of care) . Appropriate assessments completed . Again discussed personal care services with patient    Patient Self Care Activities:  . Patient verbalizes understanding of plan to make acute clinic appointment .  Self administers medications as prescribed . Attends all scheduled provider appointments . Calls pharmacy for medication refills . Calls provider office for new concerns or questions   Please see past updates related to this goal by clicking on the "Past Updates" button in the selected goal          Plan:   The care management team will reach out to the patient again over the next 30-60 days.    Kelli Churn RN, CCM, [facility] RN Care Manager [phone]

## 2020-07-17 ENCOUNTER — Other Ambulatory Visit: Payer: Self-pay

## 2020-07-17 DIAGNOSIS — N184 Chronic kidney disease, stage 4 (severe): Secondary | ICD-10-CM

## 2020-07-23 DIAGNOSIS — I639 Cerebral infarction, unspecified: Secondary | ICD-10-CM | POA: Diagnosis not present

## 2020-07-23 DIAGNOSIS — N184 Chronic kidney disease, stage 4 (severe): Secondary | ICD-10-CM | POA: Diagnosis not present

## 2020-07-23 DIAGNOSIS — N189 Chronic kidney disease, unspecified: Secondary | ICD-10-CM | POA: Diagnosis not present

## 2020-07-23 DIAGNOSIS — N2581 Secondary hyperparathyroidism of renal origin: Secondary | ICD-10-CM | POA: Diagnosis not present

## 2020-07-23 DIAGNOSIS — N400 Enlarged prostate without lower urinary tract symptoms: Secondary | ICD-10-CM | POA: Diagnosis not present

## 2020-07-23 DIAGNOSIS — D631 Anemia in chronic kidney disease: Secondary | ICD-10-CM | POA: Diagnosis not present

## 2020-07-24 ENCOUNTER — Ambulatory Visit: Payer: Medicare HMO | Admitting: *Deleted

## 2020-07-24 DIAGNOSIS — N184 Chronic kidney disease, stage 4 (severe): Secondary | ICD-10-CM

## 2020-07-24 DIAGNOSIS — N183 Chronic kidney disease, stage 3 unspecified: Secondary | ICD-10-CM

## 2020-07-24 DIAGNOSIS — I10 Essential (primary) hypertension: Secondary | ICD-10-CM

## 2020-07-24 NOTE — Chronic Care Management (AMB) (Addendum)
Chronic Care Management   Follow Up Note   07/24/2020 Name: Derrick Hendrix. MRN: [id number] DOB: [birth year]  Referred by: [name], MD Reason for referral : Chronic Care Management (HTN, DM, A fib, CKD)   Derrick Hendrix. is a 79 y.o. year old male who is a primary care patient of [name], [name], MD. The CCM team was consulted for assistance with chronic disease management and care coordination needs.    Review of patient status, including review of consultants reports, relevant laboratory and other test results, and collaboration with appropriate care team members and the patient's provider was performed as part of comprehensive patient evaluation and provision of chronic care management services.    SDOH (Social Determinants of Health) assessments performed: No See Care Plan activities for detailed interventions related to [facility])     Outpatient Encounter Medications as of 07/24/2020  Medication Sig  . hydrALAZINE (APRESOLINE) 10 MG tablet TAKE ONE TABLET BY MOUTH THREE TIMES A DAY  . tamsulosin (FLOMAX) 0.4 MG CAPS capsule TAKE ONE CAPSULE BY MOUTH DAILY AFTER SUPPER  . acetaminophen (TYLENOL) 500 MG tablet Take 2 tablets (1,000 mg total) by mouth every 6 (six) hours as needed for mild pain (or Fever >/= 101).  [name] amLODipine (NORVASC) 10 MG tablet TAKE ONE TABLET BY MOUTH DAILY  . atorvastatin (LIPITOR) 80 MG tablet TAKE ONE TABLET BY MOUTH DAILY (Patient taking differently: Take 80 mg by mouth daily at 6 PM. )  . Blood Pressure Monitoring (BLOOD PRESSURE KIT) DEVI Please check blood pressure twice a day. In the morning and evening. Please record numbers on a sheet of paper. Blood pressure parameters are 120-150/80-90  . clonazePAM (KLONOPIN) 0.25 MG disintegrating tablet TAKE 1 TABLET (0.25 MG TOTAL) BY MOUTH THREE (THREE) TIMES DAILY.  [name] docusate sodium (COLACE) 100 MG capsule Take 1 capsule (100 mg total) by mouth daily.  [name] ELIQUIS 2.5 MG TABS tablet TAKE ONE TABLET BY MOUTH  TWICE A DAY  . finasteride (PROSCAR) 5 MG tablet TAKE ONE TABLET BY MOUTH DAILY (Patient taking differently: Take 5 mg by mouth daily. )  . fludrocortisone (FLORINEF) 0.1 MG tablet TAKE ONE TABLET BY MOUTH TWICE A DAY  . furosemide (LASIX) 20 MG tablet TAKE ONE TABLET BY MOUTH DAILY  . ipratropium (ATROVENT) 0.06 % nasal spray Place 2 sprays into the nose 3 (three) times daily. (Patient taking differently: Place 2 sprays into the nose daily as needed for rhinitis. )  . nitroGLYCERIN (NITROSTAT) 0.4 MG SL tablet Place 1 tablet (0.4 mg total) under the tongue every 5 (five) minutes x 3 doses as needed for chest pain.  . predniSONE (DELTASONE) 10 MG tablet   . SPS 15 GM/60ML suspension SMARTSIG:240 Milliliter(s) By Mouth Once  . triamcinolone cream (KENALOG) 0.1 % SMARTSIG:1 Application Topical 2-3 Times Daily   No facility-administered encounter medications on file as of 07/24/2020.     Objective:  Wt Readings from Last 3 Encounters:  06/04/20 220 lb 8 oz (100 kg)  04/23/20 208 lb 14.4 oz (94.8 kg)  03/07/20 202 lb 6.4 oz (91.8 kg)   Lab Results  Component Value Date   CREATININE 3.71 (H) 04/23/2020   BUN 58 (H) 04/23/2020   NA 145 (H) 04/23/2020   K 5.5 (H) 04/23/2020   CL 112 (H) 04/23/2020   CO2 16 (L) 04/23/2020    Goals Addressed              This Visit's Progress  Patient Stated   .  " I have a rash all over my body, even on my head and it itches and burns so bad I can't sleep." (pt-stated)        CARE PLAN ENTRY (see longitudinal plan of care for additional care plan information)  Current Barriers:  [name] Knowledge Deficits related to managing pruritic rash- patient left lengthy voice mail for this CCM RN on 07/23/20 at 2:22 pm stating he went to the kidney doctor today and they want to "put a tube in his arm" but he doesn't want to proceed until something can be done about the rash, says he is still suffering from an intensely pruritic rash that has gotten worse, says he  is unable to sleep due to the itching except when he takes the benadryl then he says he sleeps all the time, says he needs help from the clinic because the dermatologist is not helping .  Nurse Case Manager Clinical Goal(s):  [name] Over the next 30 -60 days, patient will keep his appointment with all providers  Interventions:  . Inter-disciplinary care team collaboration (see longitudinal plan of care) . Will send patient's message to provider for advice    Patient Self Care Activities:  . Patient verbalizes understanding of plan to make acute clinic appointment . Self administers medications as prescribed . Attends all scheduled provider appointments . Calls pharmacy for medication refills . Calls provider office for new concerns or questions   Please see past updates related to this goal by clicking on the "Past Updates" button in the selected goal          Plan:   The care management team will reach out to the patient again over the next 30-60 days.    Kelli Churn RN, CCM, [facility] RN Care Manager [phone]

## 2020-07-24 NOTE — Chronic Care Management (AMB) (Signed)
Chronic Care Management   Follow Up Note   07/24/2020 Name: Dreydon Cardenas. MRN: [id number] DOB: 11-06-1941  Referred by: [name], MD Reason for referral : Chronic Care Management (HTN, CKD, DM)   Becker Christopher. is a 79 y.o. year old male who is a primary care patient of [name], [name], MD. The CCM team was consulted for assistance with chronic disease management and care coordination needs.    Review of patient status, including review of consultants reports, relevant laboratory and other test results, and collaboration with appropriate care team members and the patient's provider was performed as part of comprehensive patient evaluation and provision of chronic care management services.    SDOH (Social Determinants of Health) assessments performed: No See Care Plan activities for detailed interventions related to [facility])     Outpatient Encounter Medications as of 07/24/2020  Medication Sig  . hydrALAZINE (APRESOLINE) 10 MG tablet TAKE ONE TABLET BY MOUTH THREE TIMES A DAY  . tamsulosin (FLOMAX) 0.4 MG CAPS capsule TAKE ONE CAPSULE BY MOUTH DAILY AFTER SUPPER  . acetaminophen (TYLENOL) 500 MG tablet Take 2 tablets (1,000 mg total) by mouth every 6 (six) hours as needed for mild pain (or Fever >/= 101).  [name] amLODipine (NORVASC) 10 MG tablet TAKE ONE TABLET BY MOUTH DAILY  . atorvastatin (LIPITOR) 80 MG tablet TAKE ONE TABLET BY MOUTH DAILY (Patient taking differently: Take 80 mg by mouth daily at 6 PM. )  . Blood Pressure Monitoring (BLOOD PRESSURE KIT) DEVI Please check blood pressure twice a day. In the morning and evening. Please record numbers on a sheet of paper. Blood pressure parameters are 120-150/80-90  . clonazePAM (KLONOPIN) 0.25 MG disintegrating tablet TAKE 1 TABLET (0.25 MG TOTAL) BY MOUTH THREE (THREE) TIMES DAILY.  [name] docusate sodium (COLACE) 100 MG capsule Take 1 capsule (100 mg total) by mouth daily.  [name] ELIQUIS 2.5 MG TABS tablet TAKE ONE TABLET BY MOUTH TWICE A  DAY  . finasteride (PROSCAR) 5 MG tablet TAKE ONE TABLET BY MOUTH DAILY (Patient taking differently: Take 5 mg by mouth daily. )  . fludrocortisone (FLORINEF) 0.1 MG tablet TAKE ONE TABLET BY MOUTH TWICE A DAY  . furosemide (LASIX) 20 MG tablet TAKE ONE TABLET BY MOUTH DAILY  . ipratropium (ATROVENT) 0.06 % nasal spray Place 2 sprays into the nose 3 (three) times daily. (Patient taking differently: Place 2 sprays into the nose daily as needed for rhinitis. )  . nitroGLYCERIN (NITROSTAT) 0.4 MG SL tablet Place 1 tablet (0.4 mg total) under the tongue every 5 (five) minutes x 3 doses as needed for chest pain.  . predniSONE (DELTASONE) 10 MG tablet   . SPS 15 GM/60ML suspension SMARTSIG:240 Milliliter(s) By Mouth Once  . triamcinolone cream (KENALOG) 0.1 % SMARTSIG:1 Application Topical 2-3 Times Daily   No facility-administered encounter medications on file as of 07/24/2020.     Objective:   Goals Addressed              This Visit's Progress     Patient Stated   .  " I have a rash all over my body, even on my head and it itches and burns so bad I can't sleep." (pt-stated)        CARE PLAN ENTRY (see longitudinal plan of care for additional care plan information)  Current Barriers:  [name] Knowledge Deficits related to managing pruritic rash- patient left lengthy voice mail for this CCM RN on 07/23/20 at 2:22 pm stating  he went to the kidney doctor today and they want to "put a tube in his arm" but he doesn't want to proceed until something can be done about the rash, says he is still suffering from an intensely pruritic rash that has gotten worse, says he is unable to sleep due to the itching except when he takes the benadryl then he says he sleeps all the time, says he needs help from the clinic because the dermatologist is not helping . 07/24/20 5:15 pm- spoke with patient - he says he is also nervous and "jumpy" from not sleeping because of the rash  Nurse Case Manager Clinical Goal(s):   [name] Over the next 30 -60 days, patient will keep his appointment with all providers  Interventions:  . Inter-disciplinary care team collaboration (see longitudinal plan of care) . Will send patient's message to provider for advice . Called patient to discuss his symptoms     Patient Self Care Activities:  . Patient verbalizes understanding of plan to make acute clinic appointment . Self administers medications as prescribed . Attends all scheduled provider appointments . Calls pharmacy for medication refills . Calls provider office for new concerns or questions   Please see past updates related to this goal by clicking on the "Past Updates" button in the selected goal          Plan:   The care management team will reach out to the patient again over the next 7-10 days.    Kelli Churn RN, CCM, [facility] RN Care Manager [phone]

## 2020-07-25 ENCOUNTER — Ambulatory Visit: Payer: Medicare HMO | Admitting: *Deleted

## 2020-07-25 DIAGNOSIS — N184 Chronic kidney disease, stage 4 (severe): Secondary | ICD-10-CM

## 2020-07-25 DIAGNOSIS — N183 Chronic kidney disease, stage 3 unspecified: Secondary | ICD-10-CM

## 2020-07-25 DIAGNOSIS — I10 Essential (primary) hypertension: Secondary | ICD-10-CM

## 2020-07-25 NOTE — Progress Notes (Deleted)
Hello! Discussed with Hoffman. Patient needs contact Dermatology and let their office know he is not responding to current therapy. Thank you!

## 2020-07-25 NOTE — Progress Notes (Signed)
Internal Medicine Clinic Resident  I have personally reviewed this encounter including the documentation in this note and/or discussed this patient with the care management provider. I will address any urgent items identified by the care management provider and will communicate my actions to the patient's PCP. I have reviewed the patient's CCM visit with my supervising attending, Dr Heber Sardis.  Plan:  - Recommend patient contact Dermatology office and relay that current treatment is not effective.   Jose Persia, MD 07/25/2020

## 2020-07-26 NOTE — Chronic Care Management (AMB) (Signed)
Chronic Care Management   Follow Up Note   07/26/2020 Name: Derrick Hendrix. MRN: [id number] DOB: 05/04/1941  Referred by: Yvette Rack, MD Reason for referral : Chronic Care Management (HTN, CKD, rash)   Derrick Hendrix. is a 79 y.o. year old male who is a primary care patient of [name], Derrick Staggers, MD. The CCM team was consulted for assistance with chronic disease management and care coordination needs.    Review of patient status, including review of consultants reports, relevant laboratory and other test results, and collaboration with appropriate care team members and the patient's provider was performed as part of comprehensive patient evaluation and provision of chronic care management services.    SDOH (Social Determinants of Health) assessments performed: No See Care Plan activities for detailed interventions related to [facility])     Outpatient Encounter Medications as of 07/25/2020  Medication Sig   hydrALAZINE (APRESOLINE) 10 MG tablet TAKE ONE TABLET BY MOUTH THREE TIMES A DAY   tamsulosin (FLOMAX) 0.4 MG CAPS capsule TAKE ONE CAPSULE BY MOUTH DAILY AFTER SUPPER   acetaminophen (TYLENOL) 500 MG tablet Take 2 tablets (1,000 mg total) by mouth every 6 (six) hours as needed for mild pain (or Fever >/= 101).   amLODipine (NORVASC) 10 MG tablet TAKE ONE TABLET BY MOUTH DAILY   atorvastatin (LIPITOR) 80 MG tablet TAKE ONE TABLET BY MOUTH DAILY (Patient taking differently: Take 80 mg by mouth daily at 6 PM. )   Blood Pressure Monitoring (BLOOD PRESSURE KIT) DEVI Please check blood pressure twice a day. In the morning and evening. Please record numbers on a sheet of paper. Blood pressure parameters are 120-150/80-90   clonazePAM (KLONOPIN) 0.25 MG disintegrating tablet TAKE 1 TABLET (0.25 MG TOTAL) BY MOUTH THREE (THREE) TIMES DAILY.   docusate sodium (COLACE) 100 MG capsule Take 1 capsule (100 mg total) by mouth daily.   ELIQUIS 2.5 MG TABS tablet TAKE ONE TABLET BY MOUTH TWICE A  DAY   finasteride (PROSCAR) 5 MG tablet TAKE ONE TABLET BY MOUTH DAILY (Patient taking differently: Take 5 mg by mouth daily. )   fludrocortisone (FLORINEF) 0.1 MG tablet TAKE ONE TABLET BY MOUTH TWICE A DAY   furosemide (LASIX) 20 MG tablet TAKE ONE TABLET BY MOUTH DAILY   ipratropium (ATROVENT) 0.06 % nasal spray Place 2 sprays into the nose 3 (three) times daily. (Patient taking differently: Place 2 sprays into the nose daily as needed for rhinitis. )   nitroGLYCERIN (NITROSTAT) 0.4 MG SL tablet Place 1 tablet (0.4 mg total) under the tongue every 5 (five) minutes x 3 doses as needed for chest pain.   predniSONE (DELTASONE) 10 MG tablet    SPS 15 GM/60ML suspension SMARTSIG:240 Milliliter(s) By Mouth Once   triamcinolone cream (KENALOG) 0.1 % SMARTSIG:1 Application Topical 2-3 Times Daily   No facility-administered encounter medications on file as of 07/25/2020.     Objective:  Wt Readings from Last 3 Encounters:  06/04/20 220 lb 8 oz (100 kg)  04/23/20 208 lb 14.4 oz (94.8 kg)  03/07/20 202 lb 6.4 oz (91.8 kg)   BP Readings from Last 3 Encounters:  06/04/20 (!) 166/76  04/23/20 (!) 149/76  03/07/20 107/68    Goals Addressed              This Visit's Progress     Patient Stated     " I have a rash all over my body, even on my head and it itches and burns so bad  I can't sleep." (pt-stated)        CARE PLAN ENTRY (see longitudinal plan of care for additional care plan information)  Current Barriers:   Knowledge Deficits related to managing pruritic rash-called patient to provide direction about rash; patient voiced appreciation to  this CCM RN for call  Nurse Case Manager Clinical Goal(s):   Over the next 30 -60 days, patient will keep his appointment with all providers  Interventions:   Inter-disciplinary care team collaboration (see longitudinal plan of care)  Notified patient via phone that clinic provider wants patient to contact his dermatologist for  rash assessment and treatment  Suggested he discuss rash and symptoms with Holland Falling RN that is making a wellness visit on 07/27/20    Patient Self Care Activities:   Patient verbalizes understanding of plan to make acute clinic appointment  Self administers medications as prescribed  Attends all scheduled provider appointments  Calls pharmacy for medication refills  Calls provider office for new concerns or questions   Please see past updates related to this goal by clicking on the "Past Updates" button in the selected goal          Plan:   The care management team will reach out to the patient again over the next 30-60 days.    Kelli Churn RN, CCM, [facility] RN Care Manager [phone]

## 2020-07-26 NOTE — Progress Notes (Signed)
Internal Medicine Clinic Resident  I have personally reviewed this encounter including the documentation in this note and/or discussed this patient with the care management provider. I will address any urgent items identified by the care management provider and will communicate my actions to the patient's PCP. I have reviewed the patient's CCM visit with my supervising attending, Dr Daryll Drown.  Sanjuana Letters, MD 07/26/2020

## 2020-07-27 DIAGNOSIS — Z7901 Long term (current) use of anticoagulants: Secondary | ICD-10-CM | POA: Diagnosis not present

## 2020-07-27 DIAGNOSIS — R69 Illness, unspecified: Secondary | ICD-10-CM | POA: Diagnosis not present

## 2020-07-27 DIAGNOSIS — Z87891 Personal history of nicotine dependence: Secondary | ICD-10-CM | POA: Diagnosis not present

## 2020-07-27 DIAGNOSIS — N400 Enlarged prostate without lower urinary tract symptoms: Secondary | ICD-10-CM | POA: Diagnosis not present

## 2020-07-27 DIAGNOSIS — I129 Hypertensive chronic kidney disease with stage 1 through stage 4 chronic kidney disease, or unspecified chronic kidney disease: Secondary | ICD-10-CM | POA: Diagnosis not present

## 2020-07-27 DIAGNOSIS — Z8673 Personal history of transient ischemic attack (TIA), and cerebral infarction without residual deficits: Secondary | ICD-10-CM | POA: Diagnosis not present

## 2020-07-27 DIAGNOSIS — E785 Hyperlipidemia, unspecified: Secondary | ICD-10-CM | POA: Diagnosis not present

## 2020-07-27 DIAGNOSIS — L309 Dermatitis, unspecified: Secondary | ICD-10-CM | POA: Diagnosis not present

## 2020-07-27 DIAGNOSIS — N189 Chronic kidney disease, unspecified: Secondary | ICD-10-CM | POA: Diagnosis not present

## 2020-07-27 DIAGNOSIS — Z833 Family history of diabetes mellitus: Secondary | ICD-10-CM | POA: Diagnosis not present

## 2020-07-30 ENCOUNTER — Encounter (HOSPITAL_COMMUNITY): Payer: Medicare Other

## 2020-07-30 ENCOUNTER — Other Ambulatory Visit: Payer: Self-pay

## 2020-07-30 ENCOUNTER — Other Ambulatory Visit: Payer: Self-pay | Admitting: Internal Medicine

## 2020-07-30 ENCOUNTER — Other Ambulatory Visit (HOSPITAL_COMMUNITY): Payer: Medicare Other

## 2020-07-30 ENCOUNTER — Encounter: Payer: Medicare Other | Admitting: Surgery

## 2020-07-30 ENCOUNTER — Encounter: Payer: Medicare HMO | Admitting: Surgery

## 2020-07-30 ENCOUNTER — Ambulatory Visit (HOSPITAL_COMMUNITY)
Admission: RE | Admit: 2020-07-30 | Discharge: 2020-07-30 | Disposition: A | Discharge status: Home / Self Care | Payer: Medicare HMO | Source: Ambulatory Visit | Attending: Surgery | Admitting: Surgery

## 2020-07-30 DIAGNOSIS — N184 Chronic kidney disease, stage 4 (severe): Secondary | ICD-10-CM

## 2020-07-30 DIAGNOSIS — E875 Hyperkalemia: Secondary | ICD-10-CM

## 2020-07-30 DIAGNOSIS — R339 Retention of urine, unspecified: Secondary | ICD-10-CM

## 2020-08-02 DIAGNOSIS — L400 Psoriasis vulgaris: Secondary | ICD-10-CM | POA: Diagnosis not present

## 2020-08-06 ENCOUNTER — Other Ambulatory Visit: Payer: Self-pay

## 2020-08-06 ENCOUNTER — Encounter: Payer: Self-pay | Admitting: Surgery

## 2020-08-06 ENCOUNTER — Ambulatory Visit (HOSPITAL_COMMUNITY)
Admission: RE | Admit: 2020-08-06 | Discharge: 2020-08-06 | Disposition: A | Discharge status: Home / Self Care | Payer: Medicare HMO | Source: Ambulatory Visit | Attending: Surgery | Admitting: Surgery

## 2020-08-06 ENCOUNTER — Ambulatory Visit (INDEPENDENT_AMBULATORY_CARE_PROVIDER_SITE_OTHER)
Admission: RE | Admit: 2020-08-06 | Discharge: 2020-08-06 | Disposition: A | Discharge status: Home / Self Care | Payer: Medicare HMO | Source: Ambulatory Visit | Attending: Surgery | Admitting: Surgery

## 2020-08-06 ENCOUNTER — Ambulatory Visit (INDEPENDENT_AMBULATORY_CARE_PROVIDER_SITE_OTHER): Payer: Medicare HMO | Admitting: Surgery

## 2020-08-06 VITALS — BP 180/82 | HR 63 | Temp 98.2°F | Resp 20 | Ht 74.0 in | Wt 213.7 lb

## 2020-08-06 DIAGNOSIS — N184 Chronic kidney disease, stage 4 (severe): Secondary | ICD-10-CM

## 2020-08-06 DIAGNOSIS — Z111 Encounter for screening for respiratory tuberculosis: Secondary | ICD-10-CM | POA: Diagnosis not present

## 2020-08-06 NOTE — Progress Notes (Signed)
[facility] [place]  Patient name: Derrick Hendrix. MRN: [id number] DOB: December 30, 1940 Sex: male   REQUESTING PROVIDER:    Dr. Justin Mend   REASON FOR CONSULT:    Dialysis access  HISTORY OF PRESENT ILLNESS:   Derrick Hendrix. is a 79 y.o. male, who is referred for dialysis access.  He has a history of peripheral vascular disease.  He has known claudication and developed a nonhealing wound in 2018.  Angiography revealed a superficial femoral artery occlusion which was stented on 03/06/2017.  He was found to have diffuse tibial disease.  He has not been seen in the office since that time.  Patient has a significant smoking history.  He also has a history of stroke in 2017.  He has spasticity on his left side.  He is on a statin for hypercholesterolemia.  He is medically managed for hypertension.  He is on Eliquis for AFIB he also has significant issues with psoriasis.  PAST MEDICAL HISTORY    Past Medical History:  Diagnosis Date  . Abscess of foot without toes, left 08/28/2017  . Chronic kidney disease (CKD), stage III (moderate)   . Constipation 11/07/2014  . Daily headache    "lately" (04/17/2016)  . Depression   . Dizziness 03/07/2016  . Headache 06/26/2014  . Heart murmur   . High cholesterol   . Hyperkalemia 08/2014  . Hyperkalemia 10/20/2014  . Hyperkalemia 10/05/2018  . Hyperlipemia 11/11/2016  . Hypertension   . Neuropathic pain 02/06/2015  . Osteomyelitis ([place])   . PAD (peripheral artery disease) ([place])   . Paronychia of great toe, right   . Preop cardiovascular exam   . Right foot pain 02/09/2017  . Stable angina ([place]) 06/27/2014  . Stroke ([place]) 03/2016   hx of PAD; j"ust lots of headaches since" (04/17/2016)  . Vitamin B12 deficiency 06/27/2014  . Weight loss, unintentional 06/27/2014     FAMILY HISTORY   Family History  Problem Relation Age of Onset  . Hypertension Mother        died age 42  . Diabetes  Mother     SOCIAL HISTORY:   Social History   Socioeconomic History  . Marital status: Single    Spouse name: Not on file  . Number of children: Not on file  . Years of education: Not on file  . Highest education level: Not on file  Occupational History  . Not on file  Tobacco Use  . Smoking status: Former Smoker    Years: 66.00    Types: Cigarettes    Start date: 06/23/2017  . Smokeless tobacco: Never Used  Vaping Use  . Vaping Use: Never used  Substance and Sexual Activity  . Alcohol use: No    Alcohol/week: 0.0 standard drinks    Comment: 04/17/2016 "nothing in the last 3-4 years"  . Drug use: No  . Sexual activity: Never  Other Topics Concern  . Not on file  Social History Narrative   Works in the yard      [place] Strain: [place]   . Difficulty of Paying Living Expenses: Not very hard  Food Insecurity: No Food Insecurity  . Worried About Charity fundraiser in the Last Year: Never true  . Ran Out of Food in the Last Year: Never true  Transportation Needs: No Transportation Needs  . Lack of Transportation (Medical): No  . Lack of Transportation (Non-Medical): No  Physical Activity:   .  Days of Exercise per Week: Not on file  . Minutes of Exercise per Session: Not on file  Stress:   . Feeling of Stress : Not on file  Social Connections:   . Frequency of Communication with Friends and Family: Not on file  . Frequency of Social Gatherings with Friends and Family: Not on file  . Attends Religious Services: Not on file  . Active Member of Clubs or Organizations: Not on file  . Attends Archivist Meetings: Not on file  . Marital Status: Not on file  Intimate Partner Violence:   . Fear of Current or Ex-Partner: Not on file  . Emotionally Abused: Not on file  . Physically Abused: Not on file  . Sexually Abused: Not on file    ALLERGIES:    Allergies  Allergen Reactions  . Aspirin Other (See Comments)      Nosebleeds     CURRENT MEDICATIONS:    Current Outpatient Medications  Medication Sig Dispense Refill  . acetaminophen (TYLENOL) 500 MG tablet Take 2 tablets (1,000 mg total) by mouth every 6 (six) hours as needed for mild pain (or Fever >/= 101). 30 tablet 0  . amLODipine (NORVASC) 10 MG tablet TAKE ONE TABLET BY MOUTH DAILY 90 tablet 1  . atorvastatin (LIPITOR) 80 MG tablet TAKE ONE TABLET BY MOUTH DAILY (Patient taking differently: Take 80 mg by mouth daily at 6 PM. ) 90 tablet 11  . Blood Pressure Monitoring (BLOOD PRESSURE KIT) DEVI Please check blood pressure twice a day. In the morning and evening. Please record numbers on a sheet of paper. Blood pressure parameters are 120-150/80-90 1 each 0  . calcitRIOL (ROCALTROL) 0.25 MCG capsule Take 0.25 mcg by mouth daily.    . clonazePAM (KLONOPIN) 0.25 MG disintegrating tablet TAKE ONE TABLET BY MOUTH THREE TIMES A DAY 90 tablet 0  . docusate sodium (COLACE) 100 MG capsule Take 1 capsule (100 mg total) by mouth daily. 90 capsule 3  . ELIQUIS 2.5 MG TABS tablet TAKE ONE TABLET BY MOUTH TWICE A DAY 60 tablet 2  . finasteride (PROSCAR) 5 MG tablet TAKE ONE TABLET BY MOUTH DAILY (Patient taking differently: Take 5 mg by mouth daily. ) 90 tablet 11  . fludrocortisone (FLORINEF) 0.1 MG tablet TAKE ONE TABLET BY MOUTH TWICE A DAY 180 tablet 1  . furosemide (LASIX) 20 MG tablet TAKE ONE TABLET BY MOUTH DAILY 30 tablet 2  . hydrALAZINE (APRESOLINE) 10 MG tablet TAKE ONE TABLET BY MOUTH THREE TIMES A DAY 180 tablet 1  . ipratropium (ATROVENT) 0.06 % nasal spray Place 2 sprays into the nose 3 (three) times daily. (Patient taking differently: Place 2 sprays into the nose daily as needed for rhinitis. ) 15 mL 2  . nitroGLYCERIN (NITROSTAT) 0.4 MG SL tablet Place 1 tablet (0.4 mg total) under the tongue every 5 (five) minutes x 3 doses as needed for chest pain. 30 tablet 3  . predniSONE (DELTASONE) 10 MG tablet     . SPS 15 GM/60ML suspension  SMARTSIG:240 Milliliter(s) By Mouth Once    . tamsulosin (FLOMAX) 0.4 MG CAPS capsule TAKE ONE CAPSULE BY MOUTH DAILY AFTER SUPPER 90 capsule 1  . triamcinolone cream (KENALOG) 0.1 % SMARTSIG:1 Application Topical 2-3 Times Daily     No current facility-administered medications for this visit.    REVIEW OF SYSTEMS:   '[X]'$  denotes positive finding, $RemoveBeforeDEI'[ ]'[id number]$  denotes negative finding Cardiac  Comments:  Chest pain or chest pressure:  Shortness of breath upon exertion:    Short of breath when lying flat:    Irregular heart rhythm:        Vascular    Pain in calf, thigh, or hip brought on by ambulation:    Pain in feet at night that wakes you up from your sleep:     Blood clot in your veins:    Leg swelling:         Pulmonary    Oxygen at home:    Productive cough:     Wheezing:         Neurologic    Sudden weakness in arms or legs:     Sudden numbness in arms or legs:     Sudden onset of difficulty speaking or slurred speech:    Temporary loss of vision in one eye:     Problems with dizziness:         Gastrointestinal    Blood in stool:      Vomited blood:         Genitourinary    Burning when urinating:     Blood in urine:        Psychiatric    Major depression:         Hematologic    Bleeding problems:    Problems with blood clotting too easily:        Skin    Rashes or ulcers:        Constitutional    Fever or chills:     PHYSICAL EXAM:   There were no vitals filed for this visit.  GENERAL: The patient is a well-nourished male, in no acute distress. The vital signs are documented above. CARDIAC: There is a regular rate and rhythm.  VASCULAR: Palpable brachial pulses bilaterally PULMONARY: Nonlabored respirations ABDOMEN: Soft and non-tender with normal pitched bowel sounds.  MUSCULOSKELETAL: There are no major deformities or cyanosis. NEUROLOGIC: No focal weakness or paresthesias are detected. SKIN: Significant whole-body plaque psoriasis PSYCHIATRIC:  The patient has a normal affect.  STUDIES:   I have reviewed the following: Arterial: Right: No obstruction visualized in the right upper extremity.  Left: No obstruction visualized in the left upper extremity.   Right Cephalic  Diameter (cm)Depth (cm)    Findings      +-----------------+-------------+----------+------------------------+  Shoulder       0.24                      +-----------------+-------------+----------+------------------------+  Prox upper arm    0.24                      +-----------------+-------------+----------+------------------------+  Mid upper arm    0.22                      +-----------------+-------------+----------+------------------------+  Dist upper arm    0.24                      +-----------------+-------------+----------+------------------------+  Antecubital fossa  0.24                      +-----------------+-------------+----------+------------------------+  Prox forearm     0.22                      +-----------------+-------------+----------+------------------------+  Mid forearm     0.23                      +-----------------+-------------+----------+------------------------+  Dist forearm     0.09        Non compressible chronic  +-----------------+-------------+----------+------------------------+   +-----------------+-------------+----------+--------+  Right Basilic  Diameter (cm)Depth (cm)Findings  +-----------------+-------------+----------+--------+  Prox upper arm    0.38              +-----------------+-------------+----------+--------+  Mid upper arm    0.32              +-----------------+-------------+----------+--------+  Dist upper arm    0.26               +-----------------+-------------+----------+--------+  Antecubital fossa  0.25              +-----------------+-------------+----------+--------+  Prox forearm     0.22              +-----------------+-------------+----------+--------+   The distal forearm cephalic vein appears to non compressible, consistent  with chronic thrombus.  +-----------------+-------------+----------+--------------+  Left Cephalic  Diameter (cm)Depth (cm)  Findings    +-----------------+-------------+----------+--------------+  Shoulder                 not visualized  +-----------------+-------------+----------+--------------+  Prox upper arm              not visualized  +-----------------+-------------+----------+--------------+  Mid upper arm              not visualized  +-----------------+-------------+----------+--------------+  Dist upper arm    0.29                 +-----------------+-------------+----------+--------------+  Antecubital fossa  0.30                 +-----------------+-------------+----------+--------------+  Prox forearm     0.17                 +-----------------+-------------+----------+--------------+  Mid forearm     0.19                 +-----------------+-------------+----------+--------------+  Dist forearm     0.28                 +-----------------+-------------+----------+--------------+   +-----------------+-------------+----------+--------+  Left Basilic   Diameter (cm)Depth (cm)Findings  +-----------------+-------------+----------+--------+  Prox upper arm    0.57              +-----------------+-------------+----------+--------+  Mid upper arm    0.40                +-----------------+-------------+----------+--------+  Dist upper arm    0.42              +-----------------+-------------+----------+--------+  Antecubital fossa  0.34              +-----------------+-------------+----------+--------+  Prox forearm     0.29              +-----------------+-------------+----------+--------+   ASSESSMENT and PLAN   CKD4: Because of the spasticity in his left arm, left side access is not recommended.  His best option on the right is a basilic vein fistula however his basilic vein is marginal.  I discussed proceeding with a for staged basilic vein fistula to see if we can get this to develop.  Dr. Hyman Hopes, his nephrologist wants [place] to wait on a graft.  The patient is currently seeing dermatology to see if we can get his psoriasis flareup calm down because he is itching everywhere.  I would like for him to get this under better control before we schedule him for surgery.  Again this would be a first stage right  basilic vein fistula.  He will need to be off of his Eliquis prior to the surgery.  PAD: The patient has a history of right leg ulcer and subsequent right leg stenting.  He has not had this evaluated since 2018.  I would like to get him scheduled for right leg duplex.   Leia Alf, MD, FACS [facility] [phone] Pager [phone]

## 2020-08-08 ENCOUNTER — Other Ambulatory Visit: Payer: Self-pay | Admitting: Internal Medicine

## 2020-08-08 DIAGNOSIS — R339 Retention of urine, unspecified: Secondary | ICD-10-CM

## 2020-08-14 ENCOUNTER — Ambulatory Visit: Payer: Medicare HMO | Admitting: *Deleted

## 2020-08-14 DIAGNOSIS — N183 Chronic kidney disease, stage 3 unspecified: Secondary | ICD-10-CM

## 2020-08-14 DIAGNOSIS — L409 Psoriasis, unspecified: Secondary | ICD-10-CM

## 2020-08-14 DIAGNOSIS — I10 Essential (primary) hypertension: Secondary | ICD-10-CM

## 2020-08-14 DIAGNOSIS — N184 Chronic kidney disease, stage 4 (severe): Secondary | ICD-10-CM

## 2020-08-14 NOTE — Patient Instructions (Signed)
Visit Information I will ask the front desk staff to call you to make a clinic appointment.   Goals Addressed              This Visit's Progress     Patient Stated   .  " I have a rash all over my body, even on my head and it itches and burns so bad I can't sleep." (pt-stated)        CARE PLAN ENTRY (see longitudinal plan of care for additional care plan information)  Current Barriers:  [name] Knowledge Deficits related to managing pruritic rash-spoke with patient an he states he has been back to the dermatologist and they tested him for TB in anticipation of starting treatment with biologics for his psoriasis , he also said he saw Dr. Trula Slade to discuss placing hemodialysis access but he does not want to proceed until patient's psoriasis is in better control  Nurse Case Manager Clinical Goal(s):  [name] Over the next 30 -60 days, patient will keep his appointment with all providers  Interventions:  . Inter-disciplinary care team collaboration (see longitudinal plan of care) . Received update from patient on most recent dermatologist recommendations to treat his skin rash  . Positive reinforcement given to patient for following up with dermatologist  . Informed patient this CCM RN will order a long handled lotion applicator for him    Patient Self Care Activities:  . Patient verbalizes understanding of plan to make acute clinic appointment . Self administers medications as prescribed . Attends all scheduled provider appointments . Calls pharmacy for medication refills . Calls provider office for new concerns or questions   Please see past updates related to this goal by clicking on the "Past Updates" button in the selected goal      .  "I'm having difficulty swallowing at times and the jerking on my left side seems worse and my left ankle and foot seem weaker.' (pt-stated)        CARE PLAN ENTRY (see longitudinal plan of care for additional care plan information)  Current Barriers:    . Care Coordination needs related to new numerological symptoms in a patient with HTN, HLD, NIDDM, A Fib and hx of prior stroke. - spoke with patient via phone, he says he is getting strangled easily especially when he drinks water, and he says the jerking on his left side seems worse and he feels as though his left side is weaker, he describes these symptoms as intermittent that he first noticed several weeks ago, when it was suggested he make a clinic appointment he stated he has difficulty getting through to the front desk at the clinic and requested assistance from this CCM RN  Nurse Case Manager Clinical Goal(s):  [name] Over the next 7-10 days, patient will attend clinic appointments to have his neurological symptoms assessed.   Interventions:  . Inter-disciplinary care team collaboration (see longitudinal plan of care) . Discussed concerns that he may be aspirating which could put him at risk for pneumonia and advised him he needs to be evaluated at the clinic  . Advised patient this CCM RN will message clinic front desk and request assistance in scheduling appointment for patient  Patient Self Care Activities:  . Patient verbalizes understanding of plan to schedule and keep clinic appointment  . Does not contact provider office for questions/concerns  Initial goal documentation        The patient verbalized understanding of instructions provided today and declined a print  copy of patient instruction materials.   The care management team will reach out to the patient again over the next 30-60 days.   Kelli Churn RN, CCM, [facility] RN Care Manager [phone]

## 2020-08-14 NOTE — Chronic Care Management (AMB) (Signed)
Chronic Care Management   Follow Up Note   08/14/2020 Name: Derrick Berns Jr. MRN: [id number] DOB: 09/30/1941  Referred by: [name], Obed K, MD Reason for referral : Chronic Care Management (NIDDM, HTN, HLD, A Fib, s/p CVA)   Derrick Hurlock Jr. is a 79 y.o. year old male who is a primary care patient of [name], Obed K, MD. The CCM team was consulted for assistance with chronic disease management and care coordination needs.    Review of patient status, including review of consultants reports, relevant laboratory and other test results, and collaboration with appropriate care team members and the patient's provider was performed as part of comprehensive patient evaluation and provision of chronic care management services.    SDOH (Social Determinants of Health) assessments performed: No See Care Plan activities for detailed interventions related to SDOH)     Outpatient Encounter Medications as of 08/14/2020  Medication Sig  . acetaminophen (TYLENOL) 500 MG tablet Take 2 tablets (1,000 mg total) by mouth every 6 (six) hours as needed for mild pain (or Fever >/= 101).  . amLODipine (NORVASC) 10 MG tablet TAKE ONE TABLET BY MOUTH DAILY  . atorvastatin (LIPITOR) 80 MG tablet TAKE ONE TABLET BY MOUTH DAILY (Patient taking differently: Take 80 mg by mouth daily at 6 PM. )  . Blood Pressure Monitoring (BLOOD PRESSURE KIT) DEVI Please check blood pressure twice a day. In the morning and evening. Please record numbers on a sheet of paper. Blood pressure parameters are 120-150/80-90  . calcitRIOL (ROCALTROL) 0.25 MCG capsule Take 0.25 mcg by mouth daily.  . clonazePAM (KLONOPIN) 0.25 MG disintegrating tablet TAKE ONE TABLET BY MOUTH THREE TIMES A DAY  . docusate sodium (COLACE) 100 MG capsule Take 1 capsule (100 mg total) by mouth daily.  . ELIQUIS 2.5 MG TABS tablet TAKE ONE TABLET BY MOUTH TWICE A DAY  . finasteride (PROSCAR) 5 MG tablet TAKE ONE TABLET BY MOUTH DAILY (Patient taking differently:  Take 5 mg by mouth daily. )  . fludrocortisone (FLORINEF) 0.1 MG tablet TAKE ONE TABLET BY MOUTH TWICE A DAY  . furosemide (LASIX) 20 MG tablet TAKE ONE TABLET BY MOUTH DAILY  . hydrALAZINE (APRESOLINE) 10 MG tablet TAKE ONE TABLET BY MOUTH THREE TIMES A DAY  . nitroGLYCERIN (NITROSTAT) 0.4 MG SL tablet Place 1 tablet (0.4 mg total) under the tongue every 5 (five) minutes x 3 doses as needed for chest pain.  . predniSONE (DELTASONE) 10 MG tablet   . SPS 15 GM/60ML suspension SMARTSIG:240 Milliliter(s) By Mouth Once  . tamsulosin (FLOMAX) 0.4 MG CAPS capsule TAKE ONE CAPSULE BY MOUTH DAILY AFTER SUPPER  . triamcinolone cream (KENALOG) 0.1 % SMARTSIG:1 Application Topical 2-3 Times Daily  . triamcinolone ointment (KENALOG) 0.1 % Apply 1 application topically 2 (two) times daily.  . [DISCONTINUED] ipratropium (ATROVENT) 0.06 % nasal spray Place 2 sprays into the nose 3 (three) times daily. (Patient taking differently: Place 2 sprays into the nose daily as needed for rhinitis. )   No facility-administered encounter medications on file as of 08/14/2020.     Objective:  BP Readings from Last 3 Encounters:  08/06/20 (!) 180/82  06/04/20 (!) 166/76  04/23/20 (!) 149/76   Wt Readings from Last 3 Encounters:  08/06/20 213 lb 11.2 oz (96.9 kg)  06/04/20 220 lb 8 oz (100 kg)  04/23/20 208 lb 14.4 oz (94.8 kg)    Goals Addressed              This Visit's   Progress     Patient Stated   .  " I have a rash all over my body, even on my head and it itches and burns so bad I can't sleep." (pt-stated)        CARE PLAN ENTRY (see longitudinal plan of care for additional care plan information)  Current Barriers:  [name] Knowledge Deficits related to managing pruritic rash-spoke with patient an he states he has been back to the dermatologist and they tested him for TB in anticipation of starting treatment with biologics for his psoriasis , he also said he saw Dr. Trula Slade to discuss placing hemodialysis  access but he does not want to proceed until patient's psoriasis is in better control  Nurse Case Manager Clinical Goal(s):  [name] Over the next 30 -60 days, patient will keep his appointment with all providers  Interventions:  . Inter-disciplinary care team collaboration (see longitudinal plan of care) . Received update from patient on most recent dermatologist recommendations to treat his skin rash  . Positive reinforcement given to patient for following up with dermatologist  . Informed patient this CCM RN will order a long handled lotion applicator for him    Patient Self Care Activities:  . Patient verbalizes understanding of plan to make acute clinic appointment . Self administers medications as prescribed . Attends all scheduled provider appointments . Calls pharmacy for medication refills . Calls provider office for new concerns or questions   Please see past updates related to this goal by clicking on the "Past Updates" button in the selected goal      .  "I'm having difficulty swallowing at times and the jerking on my left side seems worse and my left ankle and foot seem weaker.' (pt-stated)        CARE PLAN ENTRY (see longitudinal plan of care for additional care plan information)  Current Barriers:  . Care Coordination needs related to new numerological symptoms in a patient with HTN, HLD, NIDDM, A Fib and hx of prior stroke. - spoke with patient via phone, he says he is getting strangled easily especially when he drinks water, and he says the jerking on his left side seems worse and he feels as though his left side is weaker, he describes these symptoms as intermittent that he first noticed several weeks ago, when it was suggested he make a clinic appointment he stated he has difficulty getting through to the front desk at the clinic and requested assistance from this CCM RN  Nurse Case Manager Clinical Goal(s):  [name] Over the next 7-10 days, patient will attend clinic  appointments to have his neurological symptoms assessed.   Interventions:  . Inter-disciplinary care team collaboration (see longitudinal plan of care) . Discussed concerns that he may be aspirating which could put him at risk for pneumonia and advised him he needs to be evaluated at the clinic  . Advised patient this CCM RN will message clinic front desk and request assistance in scheduling appointment for patient  Patient Self Care Activities:  . Patient verbalizes understanding of plan to schedule and keep clinic appointment  . Does not contact provider office for questions/concerns  Initial goal documentation         Plan:   The care management team will reach out to the patient again over the next 30-60 days.   Kelli Churn RN, CCM, [facility] RN Care Manager [phone]

## 2020-08-14 NOTE — Progress Notes (Signed)
Internal Medicine Clinic Resident  I have personally reviewed this encounter including the documentation in this note and/or discussed this patient with the care management provider. C/o difficulty swallowing, worsening left-sided jerking episodes, and weakness of left ankle and foot. Will be scheduled for a clinic visit in one week. I will address any urgent items identified by the care management provider and will communicate my actions to the patient's PCP. I have reviewed the patient's CCM visit with my supervising attending, Dr Rebeca Alert.  Virl Axe, MD 08/14/2020

## 2020-08-15 NOTE — Progress Notes (Signed)
Internal Medicine Clinic Attending  CCM services provided by the care management provider and their documentation were reviewed with Dr. Allyson Sabal.  We reviewed the pertinent findings, urgent action items addressed by the resident and non-urgent items to be addressed by the PCP.  I agree with the assessment, diagnosis, and plan of care documented in the CCM and resident's note.  As noted, needs further evaluation of dysphagia and jerking movements.   Oda Kilts, MD 08/15/2020

## 2020-08-22 ENCOUNTER — Other Ambulatory Visit: Payer: Self-pay | Admitting: Internal Medicine

## 2020-08-22 NOTE — Telephone Encounter (Signed)
Next appt scheduled is tomorrow.

## 2020-08-23 ENCOUNTER — Encounter: Payer: Self-pay | Admitting: Internal Medicine

## 2020-08-23 ENCOUNTER — Encounter (HOSPITAL_COMMUNITY): Payer: Self-pay

## 2020-08-23 ENCOUNTER — Ambulatory Visit (INDEPENDENT_AMBULATORY_CARE_PROVIDER_SITE_OTHER): Payer: Medicare HMO | Admitting: Internal Medicine

## 2020-08-23 ENCOUNTER — Other Ambulatory Visit: Payer: Self-pay

## 2020-08-23 ENCOUNTER — Inpatient Hospital Stay (HOSPITAL_COMMUNITY)
Admission: EM | Admit: 2020-08-23 | Discharge: 2020-08-26 | DRG: 683 | Disposition: A | Discharge status: Home / Self Care | Payer: Medicare HMO | Attending: Internal Medicine | Admitting: Internal Medicine

## 2020-08-23 ENCOUNTER — Ambulatory Visit: Payer: Medicare HMO | Admitting: *Deleted

## 2020-08-23 ENCOUNTER — Emergency Department (HOSPITAL_COMMUNITY): Payer: Medicare HMO

## 2020-08-23 VITALS — BP 155/80 | HR 90 | Temp 98.2°F | Ht 74.0 in | Wt 214.6 lb

## 2020-08-23 DIAGNOSIS — I129 Hypertensive chronic kidney disease with stage 1 through stage 4 chronic kidney disease, or unspecified chronic kidney disease: Secondary | ICD-10-CM | POA: Diagnosis not present

## 2020-08-23 DIAGNOSIS — N184 Chronic kidney disease, stage 4 (severe): Secondary | ICD-10-CM

## 2020-08-23 DIAGNOSIS — N401 Enlarged prostate with lower urinary tract symptoms: Secondary | ICD-10-CM | POA: Diagnosis present

## 2020-08-23 DIAGNOSIS — I10 Essential (primary) hypertension: Secondary | ICD-10-CM

## 2020-08-23 DIAGNOSIS — R2689 Other abnormalities of gait and mobility: Secondary | ICD-10-CM | POA: Diagnosis present

## 2020-08-23 DIAGNOSIS — M869 Osteomyelitis, unspecified: Secondary | ICD-10-CM | POA: Diagnosis present

## 2020-08-23 DIAGNOSIS — Z7901 Long term (current) use of anticoagulants: Secondary | ICD-10-CM

## 2020-08-23 DIAGNOSIS — R21 Rash and other nonspecific skin eruption: Secondary | ICD-10-CM | POA: Diagnosis present

## 2020-08-23 DIAGNOSIS — I739 Peripheral vascular disease, unspecified: Secondary | ICD-10-CM

## 2020-08-23 DIAGNOSIS — F329 Major depressive disorder, single episode, unspecified: Secondary | ICD-10-CM | POA: Diagnosis present

## 2020-08-23 DIAGNOSIS — Z79899 Other long term (current) drug therapy: Secondary | ICD-10-CM

## 2020-08-23 DIAGNOSIS — L409 Psoriasis, unspecified: Secondary | ICD-10-CM | POA: Diagnosis not present

## 2020-08-23 DIAGNOSIS — E7800 Pure hypercholesterolemia, unspecified: Secondary | ICD-10-CM | POA: Diagnosis present

## 2020-08-23 DIAGNOSIS — R1310 Dysphagia, unspecified: Secondary | ICD-10-CM | POA: Diagnosis not present

## 2020-08-23 DIAGNOSIS — E8770 Fluid overload, unspecified: Secondary | ICD-10-CM | POA: Diagnosis not present

## 2020-08-23 DIAGNOSIS — S99911A Unspecified injury of right ankle, initial encounter: Secondary | ICD-10-CM | POA: Diagnosis present

## 2020-08-23 DIAGNOSIS — E1122 Type 2 diabetes mellitus with diabetic chronic kidney disease: Secondary | ICD-10-CM | POA: Diagnosis not present

## 2020-08-23 DIAGNOSIS — L299 Pruritus, unspecified: Secondary | ICD-10-CM | POA: Diagnosis present

## 2020-08-23 DIAGNOSIS — Z833 Family history of diabetes mellitus: Secondary | ICD-10-CM

## 2020-08-23 DIAGNOSIS — E785 Hyperlipidemia, unspecified: Secondary | ICD-10-CM | POA: Diagnosis present

## 2020-08-23 DIAGNOSIS — N189 Chronic kidney disease, unspecified: Secondary | ICD-10-CM | POA: Diagnosis present

## 2020-08-23 DIAGNOSIS — E875 Hyperkalemia: Secondary | ICD-10-CM | POA: Diagnosis not present

## 2020-08-23 DIAGNOSIS — Z20822 Contact with and (suspected) exposure to covid-19: Secondary | ICD-10-CM | POA: Diagnosis present

## 2020-08-23 DIAGNOSIS — K224 Dyskinesia of esophagus: Secondary | ICD-10-CM | POA: Diagnosis present

## 2020-08-23 DIAGNOSIS — N179 Acute kidney failure, unspecified: Secondary | ICD-10-CM | POA: Diagnosis present

## 2020-08-23 DIAGNOSIS — I4891 Unspecified atrial fibrillation: Secondary | ICD-10-CM | POA: Diagnosis present

## 2020-08-23 DIAGNOSIS — E538 Deficiency of other specified B group vitamins: Secondary | ICD-10-CM | POA: Diagnosis present

## 2020-08-23 DIAGNOSIS — E1151 Type 2 diabetes mellitus with diabetic peripheral angiopathy without gangrene: Secondary | ICD-10-CM | POA: Diagnosis not present

## 2020-08-23 DIAGNOSIS — N183 Chronic kidney disease, stage 3 unspecified: Secondary | ICD-10-CM

## 2020-08-23 DIAGNOSIS — Z8249 Family history of ischemic heart disease and other diseases of the circulatory system: Secondary | ICD-10-CM

## 2020-08-23 DIAGNOSIS — Z87891 Personal history of nicotine dependence: Secondary | ICD-10-CM

## 2020-08-23 DIAGNOSIS — R338 Other retention of urine: Secondary | ICD-10-CM | POA: Diagnosis present

## 2020-08-23 DIAGNOSIS — I998 Other disorder of circulatory system: Secondary | ICD-10-CM

## 2020-08-23 DIAGNOSIS — I480 Paroxysmal atrial fibrillation: Secondary | ICD-10-CM | POA: Diagnosis present

## 2020-08-23 DIAGNOSIS — M25579 Pain in unspecified ankle and joints of unspecified foot: Secondary | ICD-10-CM | POA: Diagnosis present

## 2020-08-23 DIAGNOSIS — Z8673 Personal history of transient ischemic attack (TIA), and cerebral infarction without residual deficits: Secondary | ICD-10-CM

## 2020-08-23 DIAGNOSIS — N138 Other obstructive and reflux uropathy: Secondary | ICD-10-CM | POA: Diagnosis present

## 2020-08-23 DIAGNOSIS — M79671 Pain in right foot: Secondary | ICD-10-CM

## 2020-08-23 DIAGNOSIS — E119 Type 2 diabetes mellitus without complications: Secondary | ICD-10-CM

## 2020-08-23 DIAGNOSIS — R000 Tachycardia, unspecified: Secondary | ICD-10-CM | POA: Diagnosis not present

## 2020-08-23 DIAGNOSIS — R339 Retention of urine, unspecified: Secondary | ICD-10-CM

## 2020-08-23 DIAGNOSIS — D539 Nutritional anemia, unspecified: Secondary | ICD-10-CM | POA: Diagnosis present

## 2020-08-23 DIAGNOSIS — Z7952 Long term (current) use of systemic steroids: Secondary | ICD-10-CM

## 2020-08-23 DIAGNOSIS — Y92239 Unspecified place in hospital as the place of occurrence of the external cause: Secondary | ICD-10-CM | POA: Diagnosis present

## 2020-08-23 LAB — I-STAT CHEM 8, ED
BUN: 47 mg/dL — ABNORMAL HIGH (ref 8–23)
Calcium, Ion: 1.02 mmol/L — ABNORMAL LOW (ref 1.15–1.40)
Chloride: 111 mmol/L (ref 98–111)
Creatinine, Ser: 4.1 mg/dL — ABNORMAL HIGH (ref 0.61–1.24)
Glucose, Bld: 271 mg/dL — ABNORMAL HIGH (ref 70–99)
HCT: 29 % — ABNORMAL LOW (ref 39.0–52.0)
Hemoglobin: 9.9 g/dL — ABNORMAL LOW (ref 13.0–17.0)
Potassium: 6.4 mmol/L (ref 3.5–5.1)
Sodium: 141 mmol/L (ref 135–145)
TCO2: 23 mmol/L (ref 22–32)

## 2020-08-23 LAB — BASIC METABOLIC PANEL
Anion gap: 11 (ref 5–15)
Anion gap: 10 (ref 5–15)
Anion gap: 10 (ref 5–15)
BUN: 36 mg/dL — ABNORMAL HIGH (ref 8–23)
BUN: 37 mg/dL — ABNORMAL HIGH (ref 8–23)
BUN: 39 mg/dL — ABNORMAL HIGH (ref 8–23)
CO2: 22 mmol/L (ref 22–32)
CO2: 21 mmol/L — ABNORMAL LOW (ref 22–32)
CO2: 21 mmol/L — ABNORMAL LOW (ref 22–32)
Calcium: 8.4 mg/dL — ABNORMAL LOW (ref 8.9–10.3)
Calcium: 7.9 mg/dL — ABNORMAL LOW (ref 8.9–10.3)
Calcium: 8.1 mg/dL — ABNORMAL LOW (ref 8.9–10.3)
Chloride: 111 mmol/L (ref 98–111)
Chloride: 110 mmol/L (ref 98–111)
Chloride: 112 mmol/L — ABNORMAL HIGH (ref 98–111)
Creatinine, Ser: 3.78 mg/dL — ABNORMAL HIGH (ref 0.61–1.24)
Creatinine, Ser: 3.75 mg/dL — ABNORMAL HIGH (ref 0.61–1.24)
Creatinine, Ser: 3.79 mg/dL — ABNORMAL HIGH (ref 0.61–1.24)
GFR calc Af Amer: 16 mL/min — ABNORMAL LOW (ref >60)
GFR calc Af Amer: 16 mL/min — ABNORMAL LOW (ref >60)
GFR calc Af Amer: 16 mL/min — ABNORMAL LOW (ref >60)
GFR calc non Af Amer: 14 mL/min — ABNORMAL LOW (ref >60)
GFR calc non Af Amer: 14 mL/min — ABNORMAL LOW (ref >60)
GFR calc non Af Amer: 14 mL/min — ABNORMAL LOW (ref >60)
Glucose, Bld: 105 mg/dL — ABNORMAL HIGH (ref 70–99)
Glucose, Bld: 269 mg/dL — ABNORMAL HIGH (ref 70–99)
Glucose, Bld: 94 mg/dL (ref 70–99)
Potassium: 7.2 mmol/L (ref 3.5–5.1)
Potassium: 6.2 mmol/L — ABNORMAL HIGH (ref 3.5–5.1)
Potassium: 5.8 mmol/L — ABNORMAL HIGH (ref 3.5–5.1)
Sodium: 144 mmol/L (ref 135–145)
Sodium: 141 mmol/L (ref 135–145)
Sodium: 143 mmol/L (ref 135–145)

## 2020-08-23 LAB — CBC
HCT: 31.7 % — ABNORMAL LOW (ref 39.0–52.0)
Hemoglobin: 9.1 g/dL — ABNORMAL LOW (ref 13.0–17.0)
MCH: 30.1 pg (ref 26.0–34.0)
MCHC: 28.7 g/dL — ABNORMAL LOW (ref 30.0–36.0)
MCV: 105 fL — ABNORMAL HIGH (ref 80.0–100.0)
Platelets: 299 10*3/uL (ref 150–400)
RBC: 3.02 MIL/uL — ABNORMAL LOW (ref 4.22–5.81)
RDW: 15.1 % (ref 11.5–15.5)
WBC: 7.5 10*3/uL (ref 4.0–10.5)
nRBC: 0 % (ref 0.0–0.2)

## 2020-08-23 LAB — HEPATIC FUNCTION PANEL
ALT: 16 U/L (ref 0–44)
AST: 17 U/L (ref 15–41)
Albumin: 3.7 g/dL (ref 3.5–5.0)
Alkaline Phosphatase: 65 U/L (ref 38–126)
Bilirubin, Direct: <0.1 mg/dL (ref 0.0–0.2)
Total Bilirubin: 0.4 mg/dL (ref 0.3–1.2)
Total Protein: 7.2 g/dL (ref 6.5–8.1)

## 2020-08-23 LAB — URINALYSIS, ROUTINE W REFLEX MICROSCOPIC
Bilirubin Urine: NEGATIVE
Glucose, UA: 50 mg/dL — AB
Hgb urine dipstick: NEGATIVE
Ketones, ur: NEGATIVE mg/dL
Leukocytes,Ua: NEGATIVE
Nitrite: NEGATIVE
Protein, ur: >=300 mg/dL — AB
Specific Gravity, Urine: 1.016 (ref 1.005–1.030)
pH: 7 (ref 5.0–8.0)

## 2020-08-23 LAB — MAGNESIUM: Magnesium: 2 mg/dL (ref 1.7–2.4)

## 2020-08-23 LAB — CK: Total CK: 454 U/L — ABNORMAL HIGH (ref 49–397)

## 2020-08-23 LAB — SARS CORONAVIRUS 2 BY RT PCR (HOSPITAL ORDER, PERFORMED IN ~~LOC~~ HOSPITAL LAB): SARS Coronavirus 2: NEGATIVE

## 2020-08-23 MED ORDER — FUROSEMIDE 20 MG PO TABS
20.0000 mg | ORAL_TABLET | Freq: Every day | ORAL | Status: DC
  Administered 2020-08-24: 20 mg via ORAL
  Filled 2020-08-23: qty 1

## 2020-08-23 MED ORDER — CALCITRIOL 0.25 MCG PO CAPS
0.2500 ug | ORAL_CAPSULE | Freq: Every day | ORAL | Status: DC
  Administered 2020-08-24 – 2020-08-26 (×3): 0.25 ug via ORAL
  Filled 2020-08-23 (×4): qty 1

## 2020-08-23 MED ORDER — CLONAZEPAM 0.25 MG PO TBDP
0.2500 mg | ORAL_TABLET | Freq: Three times a day (TID) | ORAL | Status: DC
  Administered 2020-08-24 – 2020-08-26 (×7): 0.25 mg via ORAL
  Filled 2020-08-23 (×8): qty 1

## 2020-08-23 MED ORDER — FLUDROCORTISONE ACETATE 0.1 MG PO TABS
100.0000 ug | ORAL_TABLET | Freq: Two times a day (BID) | ORAL | Status: DC
  Administered 2020-08-24 – 2020-08-26 (×5): 100 ug via ORAL
  Filled 2020-08-23 (×7): qty 1

## 2020-08-23 MED ORDER — HYDROXYZINE HCL 10 MG PO TABS
10.0000 mg | ORAL_TABLET | Freq: Three times a day (TID) | ORAL | Status: DC | PRN
  Administered 2020-08-24 (×2): 10 mg via ORAL
  Filled 2020-08-23: qty 1

## 2020-08-23 MED ORDER — SODIUM ZIRCONIUM CYCLOSILICATE 10 G PO PACK
10.0000 g | PACK | Freq: Once | ORAL | Status: AC
  Administered 2020-08-23: 10 g via ORAL
  Filled 2020-08-23: qty 1

## 2020-08-23 MED ORDER — SODIUM ZIRCONIUM CYCLOSILICATE 10 G PO PACK
10.0000 g | PACK | Freq: Once | ORAL | Status: DC
  Filled 2020-08-23: qty 1

## 2020-08-23 MED ORDER — TAMSULOSIN HCL 0.4 MG PO CAPS
0.4000 mg | ORAL_CAPSULE | Freq: Every day | ORAL | Status: DC
  Administered 2020-08-24 (×2): 0.4 mg via ORAL
  Filled 2020-08-23 (×2): qty 1

## 2020-08-23 MED ORDER — TRIAMCINOLONE ACETONIDE 0.1 % EX CREA
TOPICAL_CREAM | Freq: Two times a day (BID) | CUTANEOUS | Status: DC
  Administered 2020-08-24: 1 via TOPICAL
  Filled 2020-08-23 (×2): qty 15

## 2020-08-23 MED ORDER — ACETAMINOPHEN 650 MG RE SUPP: 650.0000 mg | Freq: Four times a day (QID) | RECTAL | Status: DC | PRN

## 2020-08-23 MED ORDER — LACTATED RINGERS IV BOLUS
500.0000 mL | Freq: Once | INTRAVENOUS | Status: AC
  Administered 2020-08-24: 500 mL via INTRAVENOUS

## 2020-08-23 MED ORDER — HYDRALAZINE HCL 10 MG PO TABS
10.0000 mg | ORAL_TABLET | Freq: Three times a day (TID) | ORAL | Status: DC
  Administered 2020-08-24 – 2020-08-26 (×7): 10 mg via ORAL
  Filled 2020-08-23 (×9): qty 1

## 2020-08-23 MED ORDER — AMLODIPINE BESYLATE 10 MG PO TABS
10.0000 mg | ORAL_TABLET | Freq: Every day | ORAL | Status: DC
  Administered 2020-08-24 – 2020-08-26 (×4): 10 mg via ORAL
  Filled 2020-08-23 (×6): qty 1

## 2020-08-23 MED ORDER — ATORVASTATIN CALCIUM 80 MG PO TABS
80.0000 mg | ORAL_TABLET | Freq: Every day | ORAL | Status: DC
  Administered 2020-08-24 – 2020-08-26 (×3): 80 mg via ORAL
  Filled 2020-08-23 (×5): qty 1

## 2020-08-23 MED ORDER — APIXABAN 2.5 MG PO TABS
2.5000 mg | ORAL_TABLET | Freq: Two times a day (BID) | ORAL | Status: DC
  Administered 2020-08-24 – 2020-08-26 (×5): 2.5 mg via ORAL
  Filled 2020-08-23 (×7): qty 1

## 2020-08-23 MED ORDER — HYDROXYZINE HCL 10 MG PO TABS: 10.0000 mg | ORAL_TABLET | Freq: Three times a day (TID) | ORAL | 0 refills | Status: DC | PRN

## 2020-08-23 MED ORDER — ACETAMINOPHEN 325 MG PO TABS
650.0000 mg | ORAL_TABLET | Freq: Four times a day (QID) | ORAL | Status: DC | PRN
  Administered 2020-08-24: 650 mg via ORAL
  Filled 2020-08-23: qty 2

## 2020-08-23 MED ORDER — POLYETHYLENE GLYCOL 3350 17 G PO PACK
17.0000 g | PACK | Freq: Every day | ORAL | Status: DC
  Administered 2020-08-25 – 2020-08-26 (×2): 17 g via ORAL
  Filled 2020-08-23 (×3): qty 1

## 2020-08-23 NOTE — ED Triage Notes (Signed)
Pt arrives to ED w/ c/o hyperkalemia. Pt denies chest pain, sob.

## 2020-08-23 NOTE — ED Notes (Signed)
Bladder Scan: 137mL

## 2020-08-23 NOTE — Assessment & Plan Note (Signed)
Psoriasis: He has had biopsy-proven psoriasis and has been following up with dermatology.  So far his psoriasis has been resistant to topical triamcinolone cream and prednisone taper.  He states that he continues to itch and this is impeding his daily function.  He does tell me that during his last follow-up with dermatology, he had a TB skin test performed.        Plan: -I will prescribe hydroxyzine to help with the itching.  In regards to his psoriasis I do wonder if he would benefit from Biologics.  Unfortunately, he would have to follow-up with dermatology to discuss further treatment.

## 2020-08-23 NOTE — ED Notes (Signed)
Pt critical CHEM8 results read back to Dr. Roslynn Amble

## 2020-08-23 NOTE — ED Provider Notes (Signed)
[facility] EMERGENCY DEPARTMENT Provider Note   CSN: [id number] Arrival date & time: 08/23/20  1755     History Chief Complaint  Patient presents with  . Abnormal Lab    Derrick Hendrix. is a 79 y.o. male.  HPI    Patient with significant medical history of CKD stage IV, psoriasis, peripheral artery disease, CVA, A. fib on Eliquis, hypertension, presents to the emergency department with hyperkalemia.  Patient was seen at his primary care office today for a follow-up and labs showed that he had a potassium of 7.2. He was instructed to come here for further evaluation.  Patient states he feels itchy and has no other complaints at this time.  He denies headache, change in vision, weakness or paresthesias in his upper or lower extremities, chest pain, shortness of breath, abdominal pain, nausea or vomiting.  He does mention that he has frequent diarrhea and sometimes his right leg swells.  He denies any alleviating or aggravating factors.  Patient denies headache, fever, chills, shortness of breath, chest pain, abdominal pain, nausea, vomiting, diarrhea.  Past Medical History:  Diagnosis Date  . Abscess of foot without toes, left 08/28/2017  . Chronic kidney disease (CKD), stage III (moderate)   . Constipation 11/07/2014  . Daily headache    "lately" (04/17/2016)  . Depression   . Dizziness 03/07/2016  . Headache 06/26/2014  . Heart murmur   . High cholesterol   . Hyperkalemia 08/2014  . Hyperkalemia 10/20/2014  . Hyperkalemia 10/05/2018  . Hyperlipemia 11/11/2016  . Hypertension   . Neuropathic pain 02/06/2015  . Osteomyelitis ([place])   . PAD (peripheral artery disease) ([place])   . Paronychia of great toe, right   . Preop cardiovascular exam   . Right foot pain 02/09/2017  . Stable angina ([place]) 06/27/2014  . Stroke ([place]) 03/2016   hx of PAD; j"ust lots of headaches since" (04/17/2016)  . Vitamin B12 deficiency 06/27/2014  . Weight loss, unintentional 06/27/2014     Patient Active Problem List   Diagnosis Date Noted  . Dysphagia 08/23/2020  . Psoriasis 04/23/2020  . Acute on chronic renal failure ([place]) 03/03/2020  . Acute kidney injury superimposed on chronic kidney disease ([place])   . Normal anion gap metabolic acidosis   . Epistaxis 02/20/2020  . Atrial fibrillation ([place]) 10/14/2018  . Type 2 diabetes mellitus ([place]) 09/04/2017  . Hemiballism 08/31/2017  . Ischemic pain of foot, right   . Severe peripheral arterial disease ([place]) 02/27/2017  . Cerebellar infarct ([place]) 05/06/2016  . Benign prostatic hyperplasia with urinary obstruction   . Orthostatic hypotension 04/04/2016  . Urinary retention 03/20/2016  . Cerebrovascular accident (CVA) due to occlusion of left posterior cerebral artery ([place])   . Dizziness 03/07/2016  . Essential hypertension   . Normocytic anemia 10/20/2014  . Hyporeninemic hypoaldosteronism ([place]) 08/24/2014  . Hyperkalemia 08/01/2014  . Healthcare maintenance 07/13/2014  . Atherosclerosis of leg with intermittent claudication, R 06/29/2014  . Current smoker 06/27/2014  . Alcohol use ([place]) 06/27/2014  . CKD (chronic kidney disease), stage IV ([place]) 06/26/2014    Past Surgical History:  Procedure Laterality Date  . ABDOMINAL AORTOGRAM W/LOWER EXTREMITY N/A 03/02/2017   Procedure: Abdominal Aortogram w/Lower Extremity;  Surgeon: Conrad Bethany, MD;  Location: [facility];  Service: Cardiovascular;  Laterality: N/A;  . EP IMPLANTABLE DEVICE N/A 03/04/2016   Procedure: Loop Recorder Insertion;  Surgeon: Thompson Grayer, MD;  Location: [facility];  Service: Cardiovascular;  Laterality:  N/A;  . PERIPHERAL VASCULAR INTERVENTION Right 03/06/2017   Procedure: Peripheral Vascular Intervention;  Surgeon: Elam Dutch, MD;  Location: [facility];  Service: Cardiovascular;  Laterality: Right;  SFA       Family History  Problem Relation Age of Onset  . Hypertension Mother        died age 41  . Diabetes  Mother     Social History   Tobacco Use  . Smoking status: Former Smoker    Years: 66.00    Types: Cigarettes    Start date: 06/23/2017  . Smokeless tobacco: Never Used  Vaping Use  . Vaping Use: Never used  Substance Use Topics  . Alcohol use: No    Alcohol/week: 0.0 standard drinks    Comment: 04/17/2016 "nothing in the last 3-4 years"  . Drug use: No    Home Medications Prior to Admission medications   Medication Sig Start Date End Date Taking? Authorizing Provider  acetaminophen (TYLENOL) 500 MG tablet Take 2 tablets (1,000 mg total) by mouth every 6 (six) hours as needed for mild pain (or Fever >/= 101). 04/05/16   Loleta Chance, MD  amLODipine (NORVASC) 10 MG tablet TAKE ONE TABLET BY MOUTH DAILY 03/15/20   [name], MD  atorvastatin (LIPITOR) 80 MG tablet TAKE ONE TABLET BY MOUTH DAILY Patient taking differently: Take 80 mg by mouth daily at 6 PM.  10/11/19   [name], [name], MD  Blood Pressure Monitoring (BLOOD PRESSURE KIT) DEVI Please check blood pressure twice a day. In the morning and evening. Please record numbers on a sheet of paper. Blood pressure parameters are 120-150/80-90 05/15/20   [name], MD  calcitRIOL (ROCALTROL) 0.25 MCG capsule Take 0.25 mcg by mouth daily. 07/12/20   [provider]  clonazePAM (KLONOPIN) 0.25 MG disintegrating tablet TAKE ONE TABLET BY MOUTH THREE TIMES A DAY 08/22/20   [name], [name], MD  docusate sodium (COLACE) 100 MG capsule Take 1 capsule (100 mg total) by mouth daily. 06/02/18   Allred, Jeneen Rinks, MD  ELIQUIS 2.5 MG TABS tablet TAKE ONE TABLET BY MOUTH TWICE A DAY 05/31/20   [name], [name], MD  finasteride (PROSCAR) 5 MG tablet TAKE ONE TABLET BY MOUTH DAILY Patient taking differently: Take 5 mg by mouth daily.  10/11/19   [name], MD  fludrocortisone (FLORINEF) 0.1 MG tablet TAKE ONE TABLET BY MOUTH TWICE A DAY 03/15/20   [name], MD  furosemide (LASIX) 20 MG tablet TAKE ONE TABLET BY MOUTH DAILY 07/30/20   Jose Persia,  MD  hydrALAZINE (APRESOLINE) 10 MG tablet TAKE ONE TABLET BY MOUTH THREE TIMES A DAY 07/03/20   Mosetta Anis, MD  hydrOXYzine (ATARAX/VISTARIL) 10 MG tablet Take 1 tablet (10 mg total) by mouth 3 (three) times daily as needed. 08/23/20   [name], MD  Multiple Vitamins-Minerals (DECUBI-VITE) CAPS Take 1 capsule by mouth daily. Patient not taking: Reported on 08/23/2020    [provider]  nitroGLYCERIN (NITROSTAT) 0.4 MG SL tablet Place 1 tablet (0.4 mg total) under the tongue every 5 (five) minutes x 3 doses as needed for chest pain. 11/29/14   Luan Moore, MD  predniSONE (DELTASONE) 10 MG tablet  05/22/20   [provider]  SPS 15 GM/60ML suspension SMARTSIG:240 Milliliter(s) By Mouth Once 03/30/20   [provider]  tamsulosin (FLOMAX) 0.4 MG CAPS capsule TAKE ONE CAPSULE BY MOUTH DAILY AFTER SUPPER 05/28/20   [name], MD  triamcinolone cream (KENALOG) 0.1 % SMARTSIG:1 Application Topical 2-3 Times Daily 05/04/20   [provider]  triamcinolone ointment (KENALOG) 0.1 % Apply 1 application topically 2 (two) times daily. 08/09/20   [provider]    Allergies    Aspirin  Review of Systems   Review of Systems  Constitutional: Negative for chills and fever.  HENT: Negative for congestion, sore throat, tinnitus and trouble swallowing.   Eyes: Negative for visual disturbance.  Respiratory: Negative for cough and shortness of breath.   Cardiovascular: Positive for leg swelling. Negative for chest pain and palpitations.  Gastrointestinal: Negative for abdominal pain, diarrhea, nausea and vomiting.  Genitourinary: Negative for enuresis and flank pain.  Musculoskeletal: Negative for back pain.  Skin: Negative for rash.  Neurological: Negative for dizziness and headaches.  Hematological: Does not bruise/bleed easily.    Physical Exam Updated Vital Signs BP (!) 160/71   Pulse 83   Temp 98.2 F (36.8 C) (Oral)   Resp 16   SpO2 97%    Physical Exam Vitals and nursing note reviewed.  Constitutional:      General: He is not in acute distress.    Appearance: He is not ill-appearing.  HENT:     Head: Normocephalic and atraumatic.     Nose: No congestion.     Mouth/Throat:     Mouth: Mucous membranes are moist.     Pharynx: Oropharynx is clear.  Eyes:     General: No scleral icterus. Cardiovascular:     Rate and Rhythm: Normal rate and regular rhythm.     Pulses: Normal pulses.     Heart sounds: No murmur heard.  No friction rub. No gallop.   Pulmonary:     Effort: No respiratory distress.     Breath sounds: No wheezing, rhonchi or rales.  Abdominal:     General: There is distension.     Palpations: Abdomen is soft.     Tenderness: There is no abdominal tenderness. There is no right CVA tenderness, left CVA tenderness or guarding.     Comments: Abdomen was visualized, distended, normoactive bowel sounds, dull to percussion, nontender to palpation, no signs of acute abdomen noted.  Musculoskeletal:        General: No swelling.     Right lower leg: No edema.     Left lower leg: No edema.     Comments: Patient's legs were visualized, right pedal edema noted, +1 edema.  No gross abnormalities noted, neurovascular fully intact.  Skin:    General: Skin is warm and dry.     Capillary Refill: Capillary refill takes less than 2 seconds.     Findings: Rash present.     Comments: Patient had a diffuse rash all over his body, scaly and consistent with psoriasis.  Neurological:     General: No focal deficit present.     Mental Status: He is alert and oriented to person, place, and time.  Psychiatric:        Mood and Affect: Mood normal.     ED Results / Procedures / Treatments   Labs (all labs ordered are listed, but only abnormal results are displayed) Labs Reviewed  CBC - Abnormal; Notable for the following components:      Result Value   RBC 3.02 (*)    Hemoglobin 9.1 (*)    HCT 31.7 (*)    MCV 105.0 (*)     MCHC 28.7 (*)    All other components within normal limits  BASIC METABOLIC PANEL - Abnormal; Notable for the following components:   Potassium 6.2 (*)    CO2 21 (*)    Glucose, Bld 269 (*)    BUN 37 (*)    Creatinine, Ser 3.75 (*)    Calcium 7.9 (*)    GFR calc non Af Amer 14 (*)    GFR calc Af Amer 16 (*)    All other components within normal limits  URINALYSIS, ROUTINE W REFLEX MICROSCOPIC - Abnormal; Notable for the following components:   Glucose, UA 50 (*)    Protein, ur >=300 (*)    Bacteria, UA RARE (*)    All other components within normal limits  I-STAT CHEM 8, ED - Abnormal; Notable for the following components:   Potassium 6.4 (*)    BUN 47 (*)    Creatinine, Ser 4.10 (*)    Glucose, Bld 271 (*)    Calcium, Ion 1.02 (*)    Hemoglobin 9.9 (*)    HCT 29.0 (*)    All other components within normal limits  SARS CORONAVIRUS 2 BY RT PCR [facility] ORDER, [facility])  MAGNESIUM  HEPATIC FUNCTION PANEL    EKG EKG Interpretation  Date/Time:  Thursday August 23 2020 19:37:24 EDT Ventricular Rate:  106 PR Interval:  164 QRS Duration: 88 QT Interval:  335 QTC Calculation: 445 R Axis:   64 Text Interpretation: Sinus tachycardia Borderline prolonged PR interval Probable left ventricular hypertrophy Nonspecific T abnormalities, lateral leads Abnormal ECG Confirmed by Carmin Muskrat [phone]) on 08/23/2020 8:37:16 PM   Radiology DG Chest 2 View  Result Date: 08/23/2020 CLINICAL DATA:  Fluid overload EXAM: CHEST - 2 VIEW COMPARISON:  08/31/2017 FINDINGS: Electronic recording device over the left chest. No significant effusion. Stable cardiomediastinal silhouette. Negative for focal airspace disease, edema or pneumothorax IMPRESSION: No active cardiopulmonary disease. Electronically Signed   By: Donavan Foil M.D.   On: 08/23/2020 20:31    Procedures Procedures (including critical care time)  Medications Ordered in ED Medications  sodium  zirconium cyclosilicate (LOKELMA) packet 10 g (10 g Oral Given 08/23/20 2001)    ED Course  I have reviewed the triage vital signs and the nursing notes.  Pertinent labs & imaging results that were available during my care of the patient were reviewed by me and considered in my medical decision making (see chart for details).    MDM Rules/Calculators/A&P                          Patient presents to the emergency department with hyperkalemia.  Patient was alert and oriented, did not appear in acute distress, vital signs reassuring.  Patient's lung sounds are clear bilaterally, abdomen distended no acute abdomen noted, right pedal edema noted.  Will order screening labs and chest x-ray further evaluation.  BMP shows hyperkalemia of 6.2, hyperglycemia to 269, bun of 37 creatinine 4.10 which appears to be at baseline for patient. will start patient on [place] for his hyperkalemia.  CBC does not show leukocytosis but does show macrocytic anemia which appears to be baseline for patient.  UA shows positive proteins, negative nitrates or leukocytes.  Chest x-ray does not show any acute abnormalities.  EKG shows sinus rhythm without signs of ischemia, ST elevation or depression.  Due to elevated potassium and slightly worsening AKI will consult nephrology for further recommendation.  Spoke with Dr. Moshe Cipro who agrees that patient does not need urgent dialysis but  does feel patient should come in for further evaluation.  Her team will come and assess the patient tomorrow.  Will consult hospitalist team for admission due to acute on chronic AKI with hyperkalemia.  Spoke with Dr. Olin Hauser of the hospitalist team who has agreed to accept the patient and will come down and evaluate him.  I have low suspicion patient will require urgent hemodialysis as he is not encephalopathic, not in respiratory distress, there is no EKG changes, still urinating.  Low suspicion patient is in hypertensive emergency or urgency as  he denies headache, chest pain, weakness or paresthesias in upper or lower extremities, no signs of organ damage.  Low suspicion for intra-abdominal abnormality requiring surgical intervention as patient denies abdominal pain, he is tolerating p.o. with difficulty, no acute abdomen on exam.  Low suspicion for cardiac abnormality as patient denies chest pain, shortness of breath, EKG does not show signs of ischemia, ST elevation or depression.  Patient was reassessed appears to be resting comfortably, vital signs reassuring.  Patient care will be turned over the hospice team for further evaluation management.    Final Clinical Impression(s) / ED Diagnoses Final diagnoses:  Hyperkalemia  AKI (acute kidney injury) ([place])  Hypertension, unspecified type    Rx / DC Orders ED Discharge Orders    None       Derrick Hendrix, [place] 08/23/20 2128    Carmin Muskrat, MD 08/25/20 1326

## 2020-08-23 NOTE — Patient Instructions (Signed)
Visit Information It was good seeing you today. I am sorry your rash is still bothering you so much. I will call your niece with the name of the over the counter anti itch scalp lotion.   Goals Addressed              This Visit's Progress     Patient Stated   .  " I have a rash all over my body, even on my head and it itches and burns so bad I can't sleep." (pt-stated)        CARE PLAN ENTRY (see longitudinal plan of care for additional care plan information)  Current Barriers:  [name] Knowledge Deficits related to managing pruritic rash-met with patient in clinic during his provider appointment, he says the rash is still very pruritic  and voiced appreciation for the long handled lotion applicator, he says he is not sure what the dermatologist is going to do to treat his rash  Nurse Case Manager Clinical Goal(s):  [name] Over the next 30 -60 days, patient will keep his appointment with all providers  Interventions:  . Inter-disciplinary care team collaboration (see longitudinal plan of care) . Provided patient with long handled lotion applicator  . Listened to discharge instructions given to patient by Dr Eileen Stanford . At patient's request , will call his niece with name of scalp anti itch lotion . Encouraged patient to follow up with dermatologist     Patient Self Care Activities:  . Patient verbalizes understanding of plan to make acute clinic appointment . Self administers medications as prescribed . Attends all scheduled provider appointments . Calls pharmacy for medication refills . Calls provider office for new concerns or questions   Please see past updates related to this goal by clicking on the "Past Updates" button in the selected goal         The patient verbalized understanding of instructions provided today and declined a print copy of patient instruction materials.   The care management team will reach out to the patient again over the next 30-60 days.   Kelli Churn  RN, CCM, [facility] RN Care Manager [phone]

## 2020-08-23 NOTE — Assessment & Plan Note (Signed)
Hypertension:-Uncontrolled  BP Readings from Last 3 Encounters:  08/23/20 (!) 155/80  08/06/20 (!) 180/82  06/04/20 (!) 166/76   Plan: -Continue amlodipine 10 mg daily -Continue hydralazine 10 mg daily (Plan to increase to 20mg  TID at follow up) -Also takes Lasix and Flomax

## 2020-08-23 NOTE — Progress Notes (Signed)
   CC: Follow up hypertension, "Choking"  HPI:  [name]. is a 79 y.o. with medical history significant for psoriasis, hypertension, CKD stage IV, peripheral arterial disease here to follow-up on chronic medical problems.  Please see problem based charting for further details.  Past Medical History:  Diagnosis Date  . Abscess of foot without toes, left 08/28/2017  . Chronic kidney disease (CKD), stage III (moderate)   . Constipation 11/07/2014  . Daily headache    "lately" (04/17/2016)  . Depression   . Dizziness 03/07/2016  . Headache 06/26/2014  . Heart murmur   . High cholesterol   . Hyperkalemia 08/2014  . Hyperkalemia 10/20/2014  . Hyperkalemia 10/05/2018  . Hyperlipemia 11/11/2016  . Hypertension   . Neuropathic pain 02/06/2015  . Osteomyelitis ([place])   . PAD (peripheral artery disease) ([place])   . Paronychia of great toe, right   . Preop cardiovascular exam   . Right foot pain 02/09/2017  . Stable angina ([place]) 06/27/2014  . Stroke ([place]) 03/2016   hx of PAD; j"ust lots of headaches since" (04/17/2016)  . Vitamin B12 deficiency 06/27/2014  . Weight loss, unintentional 06/27/2014   Review of Systems:  As per HPI  Physical Exam:  Vitals:   08/23/20 1357  BP: (!) 162/81  Pulse: 92  Temp: 98.2 F (36.8 C)  TempSrc: Oral  SpO2: 100%  Weight: 214 lb 9.6 oz (97.3 kg)  Height: 6\' 2"  (1.88 m)   Physical Exam Constitutional:      General: He is in acute distress (Due to itching).  Cardiovascular:     Rate and Rhythm: Normal rate.  Pulmonary:     Effort: Pulmonary effort is normal.     Breath sounds: No rales.  Skin:    Findings: Rash present.  Psychiatric:        Mood and Affect: Mood normal.           Assessment & Plan:   See Encounters Tab for problem based charting.  Patient discussed with Dr. Dareen Piano

## 2020-08-23 NOTE — Patient Instructions (Addendum)
Mr. Treat,   It was a pleasure taking care of you here in the clinic.  I am sorry to hear that you are still having the itching.  I am going to prescribe you an itching medicine called hydroxyzine which you would take 1 pill 3 times a day as needed for the itching.  Please follow-up with the dermatologist.  For the choking sensation you have been having, I am going to order an exam that we will check your esophagus or your food pipe.  Please follow-up with the nephrologist and vascular surgeon as well.  Take care! Dr. Eileen Stanford  Please call the internal medicine center clinic if you have any questions or concerns, we may be able to help and keep you from a long and expensive emergency room wait. Our clinic and after hours phone number is [phone], the best time to call is Monday through Friday 9 am to 4 pm but there is always someone available 24/7 if you have an emergency. If you need medication refills please notify your pharmacy one week in advance and they will send [place] a request.

## 2020-08-23 NOTE — Assessment & Plan Note (Signed)
  Peripheral vascular disease: He does not report of claudication and his only complaint is lower extremity cramps when he sits for longer period of time.  During his last visit with vascular surgery, they had plan for right lower extremity duplex ultrasound.  ABI in the clinic did show a left ABI of 0.35 however he had great pulses with Doppler bilaterally at the dorsalis pedis.  Plan: -We will make an urgent follow-up with vascular surgery.

## 2020-08-23 NOTE — Chronic Care Management (AMB) (Signed)
Chronic Care Management   Follow Up Note   08/23/2020 Name: Derrick Hendrix. MRN: [id number] DOB: 1941/05/31  Referred by: Derrick Rosenthal, MD Reason for referral : Chronic Care Management ( NIDDM, HTN, HLD, CKD, A fib, s/p CVA )   Derrick Hendrix. is a 79 y.o. year old male who is a primary care patient of [name], [name], MD. The CCM team was consulted for assistance with chronic disease management and care coordination needs.    Review of patient status, including review of consultants reports, relevant laboratory and other test results, and collaboration with appropriate care team members and the patient's provider was performed as part of comprehensive patient evaluation and provision of chronic care management services.    SDOH (Social Determinants of Health) assessments performed: No See Care Plan activities for detailed interventions related to [facility])     Outpatient Encounter Medications as of 08/23/2020  Medication Sig  . acetaminophen (TYLENOL) 500 MG tablet Take 2 tablets (1,000 mg total) by mouth every 6 (six) hours as needed for mild pain (or Fever >/= 101).  [name] amLODipine (NORVASC) 10 MG tablet TAKE ONE TABLET BY MOUTH DAILY  . atorvastatin (LIPITOR) 80 MG tablet TAKE ONE TABLET BY MOUTH DAILY (Patient taking differently: Take 80 mg by mouth daily at 6 PM. )  . Blood Pressure Monitoring (BLOOD PRESSURE KIT) DEVI Please check blood pressure twice a day. In the morning and evening. Please record numbers on a sheet of paper. Blood pressure parameters are 120-150/80-90  . calcitRIOL (ROCALTROL) 0.25 MCG capsule Take 0.25 mcg by mouth daily.  . clonazePAM (KLONOPIN) 0.25 MG disintegrating tablet TAKE ONE TABLET BY MOUTH THREE TIMES A DAY  . docusate sodium (COLACE) 100 MG capsule Take 1 capsule (100 mg total) by mouth daily.  [name] ELIQUIS 2.5 MG TABS tablet TAKE ONE TABLET BY MOUTH TWICE A DAY  . finasteride (PROSCAR) 5 MG tablet TAKE ONE TABLET BY MOUTH DAILY (Patient taking  differently: Take 5 mg by mouth daily. )  . fludrocortisone (FLORINEF) 0.1 MG tablet TAKE ONE TABLET BY MOUTH TWICE A DAY  . furosemide (LASIX) 20 MG tablet TAKE ONE TABLET BY MOUTH DAILY  . hydrALAZINE (APRESOLINE) 10 MG tablet TAKE ONE TABLET BY MOUTH THREE TIMES A DAY  . hydrOXYzine (ATARAX/VISTARIL) 10 MG tablet Take 1 tablet (10 mg total) by mouth 3 (three) times daily as needed.  . nitroGLYCERIN (NITROSTAT) 0.4 MG SL tablet Place 1 tablet (0.4 mg total) under the tongue every 5 (five) minutes x 3 doses as needed for chest pain.  . predniSONE (DELTASONE) 10 MG tablet   . SPS 15 GM/60ML suspension SMARTSIG:240 Milliliter(s) By Mouth Once  . tamsulosin (FLOMAX) 0.4 MG CAPS capsule TAKE ONE CAPSULE BY MOUTH DAILY AFTER SUPPER  . triamcinolone cream (KENALOG) 0.1 % SMARTSIG:1 Application Topical 2-3 Times Daily  . triamcinolone ointment (KENALOG) 0.1 % Apply 1 application topically 2 (two) times daily.   No facility-administered encounter medications on file as of 08/23/2020.     Objective:  BP Readings from Last 3 Encounters:  08/23/20 (!) 155/80  08/06/20 (!) 180/82  06/04/20 (!) 166/76   Wt Readings from Last 3 Encounters:  08/23/20 214 lb 9.6 oz (97.3 kg)  08/06/20 213 lb 11.2 oz (96.9 kg)  06/04/20 220 lb 8 oz (100 kg)    Goals Addressed              This Visit's Progress     Patient Stated   .  "  I have a rash all over my body, even on my head and it itches and burns so bad I can't sleep." (pt-stated)        CARE PLAN ENTRY (see longitudinal plan of care for additional care plan information)  Current Barriers:  [name] Knowledge Deficits related to managing pruritic rash-met with patient in clinic during his provider appointment, he says the rash is still very pruritic  and voiced appreciation for the long handled lotion applicator, he says he is not sure what the dermatologist is going to do to treat his rash  Nurse Case Manager Clinical Goal(s):  [name] Over the next 30 -60  days, patient will keep his appointment with all providers  Interventions:  . Inter-disciplinary care team collaboration (see longitudinal plan of care) . Provided patient with long handled lotion applicator  . Listened to discharge instructions given to patient by Dr Eileen Stanford . At patient's request , will call his niece with name of scalp anti itch lotion . Encouraged patient to follow up with dermatologist     Patient Self Care Activities:  . Patient verbalizes understanding of plan to make acute clinic appointment . Self administers medications as prescribed . Attends all scheduled provider appointments . Calls pharmacy for medication refills . Calls provider office for new concerns or questions   Please see past updates related to this goal by clicking on the "Past Updates" button in the selected goal          Plan:   The care management team will reach out to the patient again over the next 30-60 days.    Kelli Churn RN, CCM, [facility] RN Care Manager [phone]

## 2020-08-23 NOTE — Assessment & Plan Note (Addendum)
CKD stage IV, hyperkalemia: He has had worsening renal function and has been following up with nephrology and vascular surgery however there is no definitive plan in regards to vascular access for potential dialysis.  His hemiballismus from his cerebral infarction as well as his psoriasis flare is complicating vascular access placement at least from vascular surgery note.  Plan: -Follow BMP -Follow-up with vascular surgery -Follow-up with nephrology   STAT BMP revealed hyperkalemia with K+ of 7.2 with slight hemolysis. I reached out to Mr. Spittler and instructed him to present to the emergency department ASAP.

## 2020-08-23 NOTE — H&P (Signed)
Date: 08/24/2020               Patient Name:  Derrick Hendrix. MRN: [id number]  DOB: 10-20-1941 Age / Sex: 79 y.o., male   PCP: [name], MD         Medical Service: Internal Medicine Teaching Service         Attending Physician: Dr. Lucious Groves, DO    First Contact: Dr. Bridgett Larsson Pager: [phone]  Second Contact: Dr. 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 He was evaluated in clinic today for hypertension follow up and dysphagia.  Noted to have lab work significant for hyperkalemia and worsening renal function for which he was recommended further evaluation in the ED.  Patient denies any chest pain, dyspnea, headaches, dizziness/lightheadedness, palpitations.  Of note, patient endorses dysphagia for several weeks with sensation of choking.  He does endorse that he is able to tolerate liquids and soft foods; however, unable to tolerate solids.  He does have a history of tobacco and alcohol use.  PCP concern for possible malignancy for which esophagram ordered.   Patient also endorses gait instability and trouble with balance for the past few months.  He notes that his right lower extremity feels heavier and appears larger than the left.  He endorses chronic bilateral foot pain, notes that this is slightly worse over the past several days now.  Patient also noted to have a diffuse hyperpigmented, macular, pruritic rash since May 2021.  He notes that this is continuing to be an issue for him and his symptoms are  unrelieved with triamcinolone provided by dermatologist.  In the ED, patient noted to have potassium 6.4, BUN/creatinine 37/4.1 for which patient given Lokelma 10 mg.  Nephrology was consulted.  No need for urgent dialysis at this time.  Patient admitted to internal medicine for further management.  Meds:  Current Meds  Medication Sig   amLODipine (NORVASC) 10 MG tablet TAKE ONE TABLET BY MOUTH DAILY (Patient taking differently: Take 10 mg by mouth daily. )   atorvastatin (LIPITOR) 80 MG tablet TAKE ONE TABLET BY MOUTH DAILY (Patient taking differently: Take 80 mg by mouth daily. )   Blood Pressure Monitoring (BLOOD PRESSURE KIT) DEVI Please check blood pressure twice a day. In the morning and evening. Please record numbers on a sheet of paper. Blood pressure parameters are 120-150/80-90   calcitRIOL (ROCALTROL) 0.25 MCG capsule Take 0.25 mcg by mouth daily.   clonazePAM (KLONOPIN) 0.25 MG disintegrating tablet TAKE ONE TABLET BY MOUTH THREE TIMES A DAY (Patient taking differently: Take 0.25 mg by mouth 2 (two) times daily. )   docusate sodium (COLACE) 100 MG capsule Take 1 capsule (100 mg total) by mouth daily.   ELIQUIS 2.5 MG TABS tablet TAKE ONE TABLET BY MOUTH TWICE A DAY (Patient taking differently: Take 2.5 mg by mouth 2 (two) times daily. )   finasteride (PROSCAR) 5 MG tablet TAKE ONE TABLET BY MOUTH DAILY (Patient taking differently: Take 5 mg by mouth daily. )   fludrocortisone (FLORINEF) 0.1 MG  tablet TAKE ONE TABLET BY MOUTH TWICE A DAY   furosemide (LASIX) 20 MG tablet TAKE ONE TABLET BY MOUTH DAILY (Patient taking differently: Take 20 mg by mouth daily. )   hydrALAZINE (APRESOLINE) 10 MG tablet TAKE ONE TABLET BY MOUTH THREE TIMES A DAY (Patient taking differently: Take 10 mg by mouth 3 (three) times daily. )   hydrOXYzine (ATARAX/VISTARIL) 10 MG tablet Take 1 tablet (10 mg total) by mouth 3 (three) times daily as needed. (Patient taking differently: Take 10 mg by mouth 3  (three) times daily as needed for itching. )   predniSONE (DELTASONE) 10 MG tablet Take 10 mg by mouth daily.    tamsulosin (FLOMAX) 0.4 MG CAPS capsule TAKE ONE CAPSULE BY MOUTH DAILY AFTER SUPPER (Patient taking differently: Take 0.4 mg by mouth daily after supper. )   triamcinolone cream (KENALOG) 0.1 % Apply 1 application topically 2 (two) times daily.    triamcinolone ointment (KENALOG) 0.1 % Apply 1 application topically 2 (two) times daily.   Allergies: Allergies as of 08/23/2020 - Review Complete 08/23/2020  Allergen Reaction Noted   Aspirin Other (See Comments) 08/24/2014   Past Medical History:  Diagnosis Date   Abscess of foot without toes, left 08/28/2017   Chronic kidney disease (CKD), stage III (moderate)    Constipation 11/07/2014   Daily headache    "lately" (04/17/2016)   Depression    Dizziness 03/07/2016   Headache 06/26/2014   Heart murmur    High cholesterol    Hyperkalemia 08/2014   Hyperkalemia 10/20/2014   Hyperkalemia 10/05/2018   Hyperlipemia 11/11/2016   Hypertension    Neuropathic pain 02/06/2015   Osteomyelitis ([place])    PAD (peripheral artery disease) ([place])    Paronychia of great toe, right    Preop cardiovascular exam    Right foot pain 02/09/2017   Stable angina ([place]) 06/27/2014   Stroke ([place]) 03/2016   hx of PAD; j"ust lots of headaches since" (04/17/2016)   Vitamin B12 deficiency 06/27/2014   Weight loss, unintentional 06/27/2014    Family History:  Family History  Problem Relation Age of Onset   Hypertension Mother        died age 64   Diabetes Mother    Social History:  Patient has a 55-pack-year history of tobacco use.  Denies any current tobacco use  Prior history of alcohol use; quit 7-8 years ago; was drinking 1/5th per day; unclear timeline for how long he was drinking. Denies illicit drug use   Review of Systems: A complete ROS was negative except as per HPI.   Physical Exam: Blood pressure (!) 181/105,  pulse 89, temperature 98.2 F (36.8 C), resp. rate 18, SpO2 98 %. Physical Exam Vitals and nursing note reviewed.  HENT:     Head: Normocephalic.  Eyes:     Extraocular Movements: Extraocular movements intact.  Cardiovascular:     Rate and Rhythm: Normal rate and regular rhythm.     Pulses: Normal pulses.     Heart sounds: No murmur heard.   Pulmonary:     Effort: Pulmonary effort is normal. No respiratory distress.     Breath sounds: Normal breath sounds. No stridor. No wheezing, rhonchi or rales.  Abdominal:     General: There is distension.     Tenderness: There is no abdominal tenderness. There is no guarding or rebound.  Skin:    Coloration: Skin is not jaundiced.     Comments: Diffuse rash, consistent with psoriasis  Neurological:  General: No focal deficit present.     Mental Status: He is alert and oriented to person, place, and time.     Motor: Weakness (RLE) present.     Comments: Patient with constant left upper extremity spasms.   Psychiatric:        Mood and Affect: Mood normal.        Behavior: Behavior normal.    EKG: personally reviewed my interpretation is sinus tachycardia. P waves present, no prolongation of PR or QT interval. LVH present. ST wave abnormalities from prior EKG from 03/04/20 do not appear present on today's EKG.   CXR: personally reviewed my interpretation is no active cardiopulmonary disease. Compared to prior X-ray from 08/2017, no changes.   Assessment & Plan by Problem: Active Problems:   Acute on chronic renal failure [facility])  Derrick Hendrix is a 79 year old male with past medical history of advanced CKD, hypertension, peripheral artery disease, psoriasis, atrial fibrillation on Eliquis, type 2 diabetes, hemiballismus secondary to cerebellar infarct admitted for hyperkalemia in setting of acute on chronic renal failure.  Acute On Chronic Renal Failure Patient with history of CKD stage IV with worsening renal function. He is  being followed by nephrology as well as vascular surgery, however there is no current plan for dialysis at this time. This is due to his psoriasis flare as well as hemiballismus from prior cerebral infarction per vascular surgery note. Patient was found to have a potassium of 7.2 today at his Internal Medicine Clinic appointment today. Sent to the ED for further evaluation and admission for his hyperkalemia. While in the ED the patient received lokelm and IV fluids, upon repeat CMP, his potassium was 6.2 then 5.8. His creatinine was 3.78, baseline of 3.75 the past few months. Repeat CMP revealed creatinine of 3.75. Calcium of 7.9.  - Last potassium of 5.8. Lokelma 10 g packet ordered - Patient also continued on home furosemide which will contribute in excretion of potassium - Calcitriol .25 mcg oral daily ordered - No EKG changes noted, will continue to monitor for EKG changes. Cardiac Monitoring ordered - If hyperkalemia worsens, will consider calcium gluconate, IV insulin with dextrose.  - ED consulted nephrology who did not recommend urgent dialysis at this time and will assess patient in the morning, appreciate there recommendations and assistance in the patient's care.  - Continue with daily CMPS.  Dysphagia Patient endorses dysphagia for the past 4-5 weeks. He is able to tolerate water without any difficulties, but feels "choked" when eating solids. Differential includes achalasia vs zenker's diverticulum vs GERD vs esophageal dysmotility vs esophageal obstruction. Patient with PMHx of tobacco and alcohol use, so unable to rule out malignancy. - Esophagram ordered - Full liquid diet until esophagram performed and results reviewed  Gait Instability Patient endorses worsening of right lower extremity weakness, evident upon examination. Patient notes it has been worsening over the past year. Low suspicion for acute infarction - PT/OT evaluation in place  History of cerebellar infarct with  left-sided hemiballismus Patient has residual left sided hemiballismus since infarct, evident on his exam  History of atrial fibrillation on Eliquis Patient with sinus rhythm on EKG today, on eliquis - Continue eliquis 2.5 mg tab BID - Will continue to monitor for any abnormal rhythms.  Diffuse macular rash Rash consistent with diffuse psoriasis. Patient notes rash is pruritic in nature. He is followed by dermatology - Continue kenalog .1% cream BID - Hydroxyzine 10 mg TID PRN for itching  Macrocytic anemia  Patient with hemoglobin of 9.1 and MCV of 105 - B12 and Folate ordered - Will supplement as needed once B12 and Folate levels result.   Hypertension Patient with history of uncontrolled hypertension, BP of 181/105. Recent PCP note states plan to increase hydralazine at next visit - Continue home medications of amlodipine 10 mg tab daily, hydralazine 10 mg TID. - Patient also prescribed lasix and flomax  Type 2 diabetes mellitus Last A1C of 5.5, 6 months ago.  - Patient follows lifestyle modifications  Benign prostatic hyperplasia Patient with history of BPH - Continue home medication of tamulosin .4 mg daily after supper  Hyperlipidemia Last LDL of 95 four years ago.  - Home medication of atorvastatin 80 mg ordered  Diet: Full Liquid Diet DVT Prophylaxis: Eliquis 2.5 mg tab BID Code Status: Full Code  Dispo: Admit patient to Observation with expected length of stay less than 2 midnights.  SignedRiesa Pope, MD 08/24/2020, 3:10 AM  After 5pm on weekdays and 1pm on weekends: On Call pager: [phone]

## 2020-08-23 NOTE — Assessment & Plan Note (Signed)
Dysphagia: He reports of a 4 to 5-week history of dysphagia.  He states that usually when he eats he feels like he is getting choked.  He is unable to describe whether the dysphagia occurs with solids or liquids meals.  However he does mention that he is able to drink water without any difficulty.  He denies nausea, vomiting, halitosis, weight loss.  He does have a history of alcohol and tobacco use which makes suspicion for esophageal obstruction/mass likely.  Differential diagnosis includes Zenker's diverticulum versus achalasia versus GERD versus esophageal dysmotility versus esophageal obstruction  Plan: -Follow-up esophagram

## 2020-08-24 ENCOUNTER — Encounter (HOSPITAL_COMMUNITY): Payer: Self-pay | Admitting: Internal Medicine

## 2020-08-24 ENCOUNTER — Observation Stay (HOSPITAL_COMMUNITY): Payer: Medicare HMO

## 2020-08-24 DIAGNOSIS — N179 Acute kidney failure, unspecified: Secondary | ICD-10-CM | POA: Diagnosis not present

## 2020-08-24 DIAGNOSIS — E875 Hyperkalemia: Secondary | ICD-10-CM | POA: Diagnosis not present

## 2020-08-24 DIAGNOSIS — M869 Osteomyelitis, unspecified: Secondary | ICD-10-CM | POA: Diagnosis not present

## 2020-08-24 DIAGNOSIS — D649 Anemia, unspecified: Secondary | ICD-10-CM | POA: Diagnosis not present

## 2020-08-24 DIAGNOSIS — L299 Pruritus, unspecified: Secondary | ICD-10-CM | POA: Diagnosis not present

## 2020-08-24 DIAGNOSIS — R2689 Other abnormalities of gait and mobility: Secondary | ICD-10-CM | POA: Diagnosis present

## 2020-08-24 DIAGNOSIS — F329 Major depressive disorder, single episode, unspecified: Secondary | ICD-10-CM | POA: Diagnosis present

## 2020-08-24 DIAGNOSIS — Z8673 Personal history of transient ischemic attack (TIA), and cerebral infarction without residual deficits: Secondary | ICD-10-CM | POA: Diagnosis not present

## 2020-08-24 DIAGNOSIS — E538 Deficiency of other specified B group vitamins: Secondary | ICD-10-CM | POA: Diagnosis present

## 2020-08-24 DIAGNOSIS — Z79899 Other long term (current) drug therapy: Secondary | ICD-10-CM | POA: Diagnosis not present

## 2020-08-24 DIAGNOSIS — Z87891 Personal history of nicotine dependence: Secondary | ICD-10-CM | POA: Diagnosis not present

## 2020-08-24 DIAGNOSIS — E785 Hyperlipidemia, unspecified: Secondary | ICD-10-CM | POA: Diagnosis present

## 2020-08-24 DIAGNOSIS — I4891 Unspecified atrial fibrillation: Secondary | ICD-10-CM | POA: Diagnosis not present

## 2020-08-24 DIAGNOSIS — E1122 Type 2 diabetes mellitus with diabetic chronic kidney disease: Secondary | ICD-10-CM | POA: Diagnosis present

## 2020-08-24 DIAGNOSIS — E1151 Type 2 diabetes mellitus with diabetic peripheral angiopathy without gangrene: Secondary | ICD-10-CM | POA: Diagnosis not present

## 2020-08-24 DIAGNOSIS — R21 Rash and other nonspecific skin eruption: Secondary | ICD-10-CM | POA: Diagnosis present

## 2020-08-24 DIAGNOSIS — K224 Dyskinesia of esophagus: Secondary | ICD-10-CM | POA: Diagnosis not present

## 2020-08-24 DIAGNOSIS — L409 Psoriasis, unspecified: Secondary | ICD-10-CM | POA: Diagnosis present

## 2020-08-24 DIAGNOSIS — N184 Chronic kidney disease, stage 4 (severe): Secondary | ICD-10-CM | POA: Diagnosis not present

## 2020-08-24 DIAGNOSIS — E1129 Type 2 diabetes mellitus with other diabetic kidney complication: Secondary | ICD-10-CM | POA: Diagnosis not present

## 2020-08-24 DIAGNOSIS — Y92239 Unspecified place in hospital as the place of occurrence of the external cause: Secondary | ICD-10-CM | POA: Diagnosis not present

## 2020-08-24 DIAGNOSIS — E7800 Pure hypercholesterolemia, unspecified: Secondary | ICD-10-CM | POA: Diagnosis present

## 2020-08-24 DIAGNOSIS — Z7901 Long term (current) use of anticoagulants: Secondary | ICD-10-CM | POA: Diagnosis not present

## 2020-08-24 DIAGNOSIS — Z20822 Contact with and (suspected) exposure to covid-19: Secondary | ICD-10-CM | POA: Diagnosis not present

## 2020-08-24 DIAGNOSIS — I129 Hypertensive chronic kidney disease with stage 1 through stage 4 chronic kidney disease, or unspecified chronic kidney disease: Secondary | ICD-10-CM | POA: Diagnosis not present

## 2020-08-24 DIAGNOSIS — Z7952 Long term (current) use of systemic steroids: Secondary | ICD-10-CM | POA: Diagnosis not present

## 2020-08-24 DIAGNOSIS — R1310 Dysphagia, unspecified: Secondary | ICD-10-CM | POA: Diagnosis not present

## 2020-08-24 DIAGNOSIS — N138 Other obstructive and reflux uropathy: Secondary | ICD-10-CM | POA: Diagnosis not present

## 2020-08-24 LAB — PROTIME-INR
INR: 1.1 (ref 0.8–1.2)
Prothrombin Time: 13.8 seconds (ref 11.4–15.2)

## 2020-08-24 LAB — CBC
HCT: 28.7 % — ABNORMAL LOW (ref 39.0–52.0)
Hemoglobin: 8.4 g/dL — ABNORMAL LOW (ref 13.0–17.0)
MCH: 29.3 pg (ref 26.0–34.0)
MCHC: 29.3 g/dL — ABNORMAL LOW (ref 30.0–36.0)
MCV: 100 fL (ref 80.0–100.0)
Platelets: 256 10*3/uL (ref 150–400)
RBC: 2.87 MIL/uL — ABNORMAL LOW (ref 4.22–5.81)
RDW: 15.1 % (ref 11.5–15.5)
WBC: 7.1 10*3/uL (ref 4.0–10.5)
nRBC: 0 % (ref 0.0–0.2)

## 2020-08-24 LAB — COMPREHENSIVE METABOLIC PANEL
ALT: 14 U/L (ref 0–44)
AST: 15 U/L (ref 15–41)
Albumin: 3.2 g/dL — ABNORMAL LOW (ref 3.5–5.0)
Alkaline Phosphatase: 58 U/L (ref 38–126)
Anion gap: 11 (ref 5–15)
BUN: 37 mg/dL — ABNORMAL HIGH (ref 8–23)
CO2: 20 mmol/L — ABNORMAL LOW (ref 22–32)
Calcium: 8.1 mg/dL — ABNORMAL LOW (ref 8.9–10.3)
Chloride: 111 mmol/L (ref 98–111)
Creatinine, Ser: 3.69 mg/dL — ABNORMAL HIGH (ref 0.61–1.24)
GFR calc Af Amer: 17 mL/min — ABNORMAL LOW (ref >60)
GFR calc non Af Amer: 14 mL/min — ABNORMAL LOW (ref >60)
Glucose, Bld: 182 mg/dL — ABNORMAL HIGH (ref 70–99)
Potassium: 5.5 mmol/L — ABNORMAL HIGH (ref 3.5–5.1)
Sodium: 142 mmol/L (ref 135–145)
Total Bilirubin: 0.7 mg/dL (ref 0.3–1.2)
Total Protein: 6.3 g/dL — ABNORMAL LOW (ref 6.5–8.1)

## 2020-08-24 LAB — FOLATE: Folate: 14.7 ng/mL (ref >5.9)

## 2020-08-24 LAB — VITAMIN B12: Vitamin B-12: 263 pg/mL (ref 180–914)

## 2020-08-24 LAB — APTT: aPTT: 28 seconds (ref 24–36)

## 2020-08-24 MED ORDER — SODIUM ZIRCONIUM CYCLOSILICATE 10 G PO PACK
10.0000 g | PACK | Freq: Two times a day (BID) | ORAL | Status: DC
  Administered 2020-08-24: 10 g via ORAL
  Filled 2020-08-24 (×2): qty 1

## 2020-08-24 NOTE — Evaluation (Signed)
Occupational Therapy Evaluation and Discharge Patient Details Name: Derrick Hendrix. MRN: [id number] DOB: 01-Apr-1941 Today's Date: 08/24/2020    History of Present Illness Pt is an 79 year old man admitted from the clinic with hyperkalemia, worsening renal function and dysphagia. PMH: CKD IV, PAD, psoriasis, afib, DM2, cerebellar infarction.   Clinical Impression   Pt is functioning independently to modified independently in ADL and ADL transfers. Would benefit from at tub seat for safety. Pt has concerns about his diet and tendency to eat a lot of fast food. Left secure chat for case manager regarding community resources.     Follow Up Recommendations  No OT follow up    Equipment Recommendations  Tub/shower seat    Recommendations for Other Services       Precautions / Restrictions Precautions Precautions: None Precaution Comments: denies falls at home      Mobility Bed Mobility Overal bed mobility: Modified Independent                Transfers Overall transfer level: Independent Equipment used: None                  Balance                                           ADL either performed or assessed with clinical judgement   ADL Overall ADL's : At baseline                                             Vision Patient Visual Report: No change from baseline       Perception     Praxis      Pertinent Vitals/Pain Pain Assessment: No/denies pain     Hand Dominance Right   Extremity/Trunk Assessment Upper Extremity Assessment Upper Extremity Assessment: Overall WFL for tasks assessed   Lower Extremity Assessment Lower Extremity Assessment: Defer to PT evaluation   Cervical / Trunk Assessment Cervical / Trunk Assessment: Normal   Communication Communication Communication: No difficulties   Cognition Arousal/Alertness: Awake/alert Behavior During Therapy: WFL for tasks assessed/performed Overall  Cognitive Status: Within Functional Limits for tasks assessed                                     General Comments       Exercises     Shoulder Instructions      Home Living Family/patient expects to be discharged to:: Private residence Living Arrangements: Alone Available Help at Discharge: Family;Available PRN/intermittently (nieces and nephews) Type of Home: House Home Access: Stairs to enter [employer] of Steps: 3   Home Layout: One level     Bathroom Shower/Tub: Teacher, early years/pre: Standard     Home Equipment: Cane - single point;Grab bars - tub/shower;Wheelchair - [employer] - 2 wheels          Prior Functioning/Environment Level of Independence: Independent with assistive device(s)        Comments: using the cane or walker depending on his need, niece gets his groceries, does not drive        OT Problem List:        OT Treatment/Interventions:  OT Goals(Current goals can be found in the care plan section) Acute Rehab OT Goals Patient Stated Goal: to be able to eat better  OT Frequency:     Barriers to D/C:            Co-evaluation              AM-PAC OT "6 Clicks" Daily Activity     Outcome Measure Help from another person eating meals?: None Help from another person taking care of personal grooming?: None Help from another person toileting, which includes using toliet, bedpan, or urinal?: None Help from another person bathing (including washing, rinsing, drying)?: None Help from another person to put on and taking off regular upper body clothing?: None Help from another person to put on and taking off regular lower body clothing?: None 6 Click Score: 24   End of Session Equipment Utilized During Treatment: Gait belt  Activity Tolerance: Patient tolerated treatment well Patient left: Other (comment) (walking with PT)  OT Visit Diagnosis: Muscle weakness (generalized) (M62.81)                 Time: 5916-3846 OT Time Calculation (min): 14 min Charges:  OT General Charges $OT Visit: 1 Visit OT Evaluation $OT Eval Low Complexity: [place], OTR/L Acute Rehabilitation Services Pager: [phone] Office: [phone]  Malka So 08/24/2020, 9:36 AM

## 2020-08-24 NOTE — Evaluation (Signed)
Physical Therapy Evaluation Patient Details Name: Derrick Hendrix. MRN: [id number] DOB: [birth year] Today's Date: 08/24/2020   History of Present Illness  Pt is an 78 year old man admitted from the clinic with hyperkalemia, worsening renal function and dysphagia. PMH: CKD IV, PAD, psoriasis, afib, DM2, cerebellar infarction.    Clinical Impression  Pt in bed upon arrival of PT, agreeable to evaluation at this time. Prior to admission the pt was mobilizing independently at home without use of AD, independent with all ADLs, and brought food by his nieces and nephews. The pt now presents with minor limitations in functional mobility and dynamic stability due to reduced activity during his hospital stay, but is safe to return home with intermittent supervision from family. The pt was able to complete good transfers, hallway ambulation, and stairs without assist and only minor evidence of instability. The pt became progressively more stable through the ambulation and reports he is very near his baseline level of mobility. He denies any history of falls, and reports no concerns about mobility at home. All education was completed including safety with mobility, stair training, energy conservation, and importance of quality nutrition (family usually brings him fast food for every meal). The pt has no further acute PT needs, thank you for the consult.     Follow Up Recommendations No PT follow up;Supervision - Intermittent    Equipment Recommendations  None recommended by PT    Recommendations for Other Services       Precautions / Restrictions Precautions Precautions: None Precaution Comments: denies falls at home Restrictions Weight Bearing Restrictions: No      Mobility  Bed Mobility Overal bed mobility: Modified Independent                Transfers Overall transfer level: Independent Equipment used: None             General transfer comment: no assist needed, able to  transfer froma variety of surfaces  Ambulation/Gait Ambulation/Gait assistance: Independent Gait Distance (Feet): 150 Feet Assistive device: None Gait Pattern/deviations: Step-through pattern;Antalgic Gait velocity: 0.8 m/s Gait velocity interpretation: 1.31 - 2.62 ft/sec, indicative of limited community ambulator General Gait Details: slight antalgic gait with lateral movement, but improved stability with continued ambulation. pt reports he is at his baseline  Stairs Stairs: Yes Stairs assistance: Supervision Stair Management: Step to pattern;Forwards;No rails Number of Stairs: 3 (x2) General stair comments: The pt was able to complete both with and without rails, benefits from supervision for safety     Balance Overall balance assessment: Mild deficits observed, not formally tested                                           Pertinent Vitals/Pain Pain Assessment: No/denies pain    Home Living Family/patient expects to be discharged to:: Private residence Living Arrangements: Alone Available Help at Discharge: Family;Available PRN/intermittently (neices and nephews) Type of Home: House Home Access: Stairs to enter Entrance Stairs-Rails: None Entrance Stairs-Number of Steps: 3 Home Layout: One level Home Equipment: Cane - single point;Grab bars - tub/shower;Wheelchair - [employer] - 2 wheels Additional Comments: pt reports family can check on him and assist with errands    Prior Function Level of Independence: Independent with assistive device(s)         Comments: using the cane or walker depending on his need, niece gets his groceries, does not drive  Hand Dominance   Dominant Hand: Right    Extremity/Trunk Assessment   Upper Extremity Assessment Upper Extremity Assessment: Overall WFL for tasks assessed    Lower Extremity Assessment Lower Extremity Assessment: Overall WFL for tasks assessed (slight limp on LLE, pt reports this is  normal (old ankle injury))    Cervical / Trunk Assessment Cervical / Trunk Assessment: Normal  Communication   Communication: No difficulties  Cognition Arousal/Alertness: Awake/alert Behavior During Therapy: WFL for tasks assessed/performed Overall Cognitive Status: Within Functional Limits for tasks assessed                                               Assessment/Plan    PT Assessment Patent does not need any further PT services  PT Problem List         PT Treatment Interventions      PT Goals (Current goals can be found in the Care Plan section)  Acute Rehab PT Goals Patient Stated Goal: return home PT Goal Formulation: With patient Time For Goal Achievement: 09/07/20 Potential to Achieve Goals: Good     AM-PAC PT "6 Clicks" Mobility  Outcome Measure Help needed turning from your back to your side while in a flat bed without using bedrails?: None Help needed moving from lying on your back to sitting on the side of a flat bed without using bedrails?: None Help needed moving to and from a bed to a chair (including a wheelchair)?: None Help needed standing up from a chair using your arms (e.g., wheelchair or bedside chair)?: None Help needed to walk in hospital room?: None Help needed climbing 3-5 steps with a railing? : A Little 6 Click Score: 23    End of Session Equipment Utilized During Treatment: Gait belt Activity Tolerance: Patient tolerated treatment well Patient left: in chair;with call bell/phone within reach Nurse Communication: Mobility status PT Visit Diagnosis: Difficulty in walking, not elsewhere classified (R26.2)    Time: 3709-6438 PT Time Calculation (min) (ACUTE ONLY): 12 min   Charges:   PT Evaluation $PT Eval Low Complexity: 1 Low          Karma Ganja, PT, DPT   Acute Rehabilitation Department Pager #: [phone]  Otho Bellows 08/24/2020, 11:18 AM

## 2020-08-24 NOTE — Consult Note (Signed)
[facility]  HISTORY AND PHYSICAL  Derrick Lege. is an 79 y.o. male.    Chief Complaint: hyperkalemia and dysphagia  HPI: Pt is an 23 M with a PMH sig for CKD IV, h/o stroke with L sided hemiballismus, HTN, HLD, DM II, Afib on Eliquis, and psoriasis who is now seen in consultation at the request of Dr. Heber [place] for eval and recs re: hyperkalemia.   Pt is followed by Dr Justin Mend.  Last seen 07/23/20.  K was 6.0 then, Cr 3.25.  Low K diet recommended.  Seen in PCP clinic yesterday, K was 7.2, Cr 3.7.  Directed to come to the ED.    He has received aggressive medical management of hyperkalemia and is now 5.5.  Cr is stable.  In this setting we are asked to see.  Pt notes that he is tired and he would like to sleep now.  He says that he and Dr Justin Mend have been discussing dialysis.  Referral for access was going to be placed.  Since he has hemiballismus movements in L arm, access would need to be on the R.  He denies decreased appetite, dysgeusia.  Chronic itching d/t psoriasis.  He also notes a 4-5 week history of dysphagia to solids but not liquids.    PMH: Past Medical History:  Diagnosis Date  . Abscess of foot without toes, left 08/28/2017  . Chronic kidney disease (CKD), stage III (moderate)   . Constipation 11/07/2014  . Daily headache    "lately" (04/17/2016)  . Depression   . Dizziness 03/07/2016  . Headache 06/26/2014  . Heart murmur   . High cholesterol   . Hyperkalemia 08/2014  . Hyperkalemia 10/20/2014  . Hyperkalemia 10/05/2018  . Hyperlipemia 11/11/2016  . Hypertension   . Neuropathic pain 02/06/2015  . Osteomyelitis ([place])   . PAD (peripheral artery disease) ([place])   . Paronychia of great toe, right   . Preop cardiovascular exam   . Right foot pain 02/09/2017  . Stable angina ([place]) 06/27/2014  . Stroke ([place]) 03/2016   hx of PAD; j"ust lots of headaches since" (04/17/2016)  . Vitamin B12 deficiency 06/27/2014  . Weight loss, unintentional 06/27/2014   PSH: Past  Surgical History:  Procedure Laterality Date  . ABDOMINAL AORTOGRAM W/LOWER EXTREMITY N/A 03/02/2017   Procedure: Abdominal Aortogram w/Lower Extremity;  Surgeon: Conrad Sheyenne, MD;  Location: [facility];  Service: Cardiovascular;  Laterality: N/A;  . EP IMPLANTABLE DEVICE N/A 03/04/2016   Procedure: Loop Recorder Insertion;  Surgeon: Thompson Grayer, MD;  Location: [facility];  Service: Cardiovascular;  Laterality: N/A;  . PERIPHERAL VASCULAR INTERVENTION Right 03/06/2017   Procedure: Peripheral Vascular Intervention;  Surgeon: Elam Dutch, MD;  Location: [facility];  Service: Cardiovascular;  Laterality: Right;  SFA    Past Medical History:  Diagnosis Date  . Abscess of foot without toes, left 08/28/2017  . Chronic kidney disease (CKD), stage III (moderate)   . Constipation 11/07/2014  . Daily headache    "lately" (04/17/2016)  . Depression   . Dizziness 03/07/2016  . Headache 06/26/2014  . Heart murmur   . High cholesterol   . Hyperkalemia 08/2014  . Hyperkalemia 10/20/2014  . Hyperkalemia 10/05/2018  . Hyperlipemia 11/11/2016  . Hypertension   . Neuropathic pain 02/06/2015  . Osteomyelitis ([place])   . PAD (peripheral artery disease) ([place])   . Paronychia of great toe, right   . Preop cardiovascular exam   . Right  foot pain 02/09/2017  . Stable angina ([place]) 06/27/2014  . Stroke ([place]) 03/2016   hx of PAD; j"ust lots of headaches since" (04/17/2016)  . Vitamin B12 deficiency 06/27/2014  . Weight loss, unintentional 06/27/2014    Medications:   Scheduled: . amLODipine  10 mg Oral Daily  . apixaban  2.5 mg Oral BID  . atorvastatin  80 mg Oral Daily  . calcitRIOL  0.25 mcg Oral Daily  . clonazePAM  0.25 mg Oral TID  . fludrocortisone  100 mcg Oral BID WC  . hydrALAZINE  10 mg Oral TID  . polyethylene glycol  17 g Oral Daily  . sodium zirconium cyclosilicate  10 g Oral BID  . tamsulosin  0.4 mg Oral QPC supper  . triamcinolone cream   Topical BID     Medications Prior to Admission  Medication Sig Dispense Refill  . amLODipine (NORVASC) 10 MG tablet TAKE ONE TABLET BY MOUTH DAILY (Patient taking differently: Take 10 mg by mouth daily. ) 90 tablet 1  . atorvastatin (LIPITOR) 80 MG tablet TAKE ONE TABLET BY MOUTH DAILY (Patient taking differently: Take 80 mg by mouth daily. ) 90 tablet 11  . Blood Pressure Monitoring (BLOOD PRESSURE KIT) DEVI Please check blood pressure twice a day. In the morning and evening. Please record numbers on a sheet of paper. Blood pressure parameters are 120-150/80-90 1 each 0  . calcitRIOL (ROCALTROL) 0.25 MCG capsule Take 0.25 mcg by mouth daily.    . clonazePAM (KLONOPIN) 0.25 MG disintegrating tablet TAKE ONE TABLET BY MOUTH THREE TIMES A DAY (Patient taking differently: Take 0.25 mg by mouth 2 (two) times daily. ) 90 tablet 0  . docusate sodium (COLACE) 100 MG capsule Take 1 capsule (100 mg total) by mouth daily. 90 capsule 3  . ELIQUIS 2.5 MG TABS tablet TAKE ONE TABLET BY MOUTH TWICE A DAY (Patient taking differently: Take 2.5 mg by mouth 2 (two) times daily. ) 60 tablet 2  . finasteride (PROSCAR) 5 MG tablet TAKE ONE TABLET BY MOUTH DAILY (Patient taking differently: Take 5 mg by mouth daily. ) 90 tablet 11  . fludrocortisone (FLORINEF) 0.1 MG tablet TAKE ONE TABLET BY MOUTH TWICE A DAY 180 tablet 1  . furosemide (LASIX) 20 MG tablet TAKE ONE TABLET BY MOUTH DAILY (Patient taking differently: Take 20 mg by mouth daily. ) 30 tablet 2  . hydrALAZINE (APRESOLINE) 10 MG tablet TAKE ONE TABLET BY MOUTH THREE TIMES A DAY (Patient taking differently: Take 10 mg by mouth 3 (three) times daily. ) 180 tablet 1  . hydrOXYzine (ATARAX/VISTARIL) 10 MG tablet Take 1 tablet (10 mg total) by mouth 3 (three) times daily as needed. (Patient taking differently: Take 10 mg by mouth 3 (three) times daily as needed for itching. ) 30 tablet 0  . predniSONE (DELTASONE) 10 MG tablet Take 10 mg by mouth daily.     . tamsulosin  (FLOMAX) 0.4 MG CAPS capsule TAKE ONE CAPSULE BY MOUTH DAILY AFTER SUPPER (Patient taking differently: Take 0.4 mg by mouth daily after supper. ) 90 capsule 1  . triamcinolone cream (KENALOG) 0.1 % Apply 1 application topically 2 (two) times daily.     [name] triamcinolone ointment (KENALOG) 0.1 % Apply 1 application topically 2 (two) times daily.    [name] acetaminophen (TYLENOL) 500 MG tablet Take 2 tablets (1,000 mg total) by mouth every 6 (six) hours as needed for mild pain (or Fever >/= 101). (Patient not taking: Reported on 08/24/2020) 30 tablet 0  .  Multiple Vitamins-Minerals (DECUBI-VITE) CAPS Take 1 capsule by mouth daily. (Patient not taking: Reported on 08/23/2020)    . nitroGLYCERIN (NITROSTAT) 0.4 MG SL tablet Place 1 tablet (0.4 mg total) under the tongue every 5 (five) minutes x 3 doses as needed for chest pain. 30 tablet 3    ALLERGIES:   Allergies  Allergen Reactions  . Aspirin Other (See Comments)    Nosebleeds     FAM HX: Family History  Problem Relation Age of Onset  . Hypertension Mother        died age 26  . Diabetes Mother     Social History:   reports that he has quit smoking. His smoking use included cigarettes. He started smoking about 3 years ago. He quit after 66.00 years of use. He has never used smokeless tobacco. He reports that he does not drink alcohol and does not use drugs.  ROS: ROS: all other systems reviewed and are negative except as per HPI  Blood pressure (!) 142/78, pulse 88, temperature 98.2 F (36.8 C), temperature source Oral, resp. rate 18, height $RemoveBe'6\' 2"'thBCzNmlh$  (1.88 m), weight 97.3 kg, SpO2 96 %. PHYSICAL EXAM: Physical Exam  GEN NAD, sitting in bed HEENT EOMI PERRL NECK no JVD PULM clear bilaterally  CV irregular ABD soft EXT trace R LE edema NEURO AAO x 3, L sided uncontrollable movements SKIN + psoriatic rash MSK no effusions   Results for orders placed or performed during the hospital encounter of 08/23/20 (from the past 48 hour(s))  CBC      Status: Abnormal   Collection Time: 08/23/20  6:15 PM  Result Value Ref Range   WBC 7.5 4.0 - 10.5 K/uL   RBC 3.02 (L) 4.22 - 5.81 MIL/uL   Hemoglobin 9.1 (L) 13.0 - 17.0 g/dL   HCT 31.7 (L) 39 - 52 %   MCV 105.0 (H) 80.0 - 100.0 fL   MCH 30.1 26.0 - 34.0 pg   MCHC 28.7 (L) 30.0 - 36.0 g/dL   RDW 15.1 11.5 - 15.5 %   Platelets 299 150 - 400 K/uL   nRBC 0.0 0.0 - 0.2 %    Comment: Performed at [facility], [place] [address] [place], [place] [address]  Basic metabolic panel     Status: Abnormal   Collection Time: 08/23/20  6:15 PM  Result Value Ref Range   Sodium 141 135 - 145 mmol/L   Potassium 6.2 (H) 3.5 - 5.1 mmol/L   Chloride 110 98 - 111 mmol/L   CO2 21 (L) 22 - 32 mmol/L   Glucose, Bld 269 (H) 70 - 99 mg/dL    Comment: Glucose reference range applies only to samples taken after fasting for at least 8 hours.   BUN 37 (H) 8 - 23 mg/dL   Creatinine, Ser 3.75 (H) 0.61 - 1.24 mg/dL   Calcium 7.9 (L) 8.9 - 10.3 mg/dL   GFR calc non Af Amer 14 (L) >60 mL/min   GFR calc Af Amer 16 (L) >60 mL/min   Anion gap 10 5 - 15    Comment: Performed at [facility] [address] [place], [place] [address]  I-stat chem 8, ED (not at [facility] or [facility])     Status: Abnormal   Collection Time: 08/23/20  6:36 PM  Result Value Ref Range   Sodium 141 135 - 145 mmol/L   Potassium 6.4 (HH) 3.5 - 5.1 mmol/L   Chloride 111 98 - 111 mmol/L  BUN 47 (H) 8 - 23 mg/dL   Creatinine, Ser 4.10 (H) 0.61 - 1.24 mg/dL   Glucose, Bld 271 (H) 70 - 99 mg/dL    Comment: Glucose reference range applies only to samples taken after fasting for at least 8 hours.   Calcium, Ion 1.02 (L) 1.15 - 1.40 mmol/L   TCO2 23 22 - 32 mmol/L   Hemoglobin 9.9 (L) 13.0 - 17.0 g/dL   HCT 29.0 (L) 39 - 52 %  Urinalysis, Routine w reflex microscopic     Status: Abnormal   Collection Time: 08/23/20  7:44 PM  Result Value Ref Range   Color, Urine YELLOW YELLOW   APPearance CLEAR CLEAR   Specific Gravity, Urine 1.016 1.005  - 1.030   pH 7.0 5.0 - 8.0   Glucose, UA 50 (A) NEGATIVE mg/dL   Hgb urine dipstick NEGATIVE NEGATIVE   Bilirubin Urine NEGATIVE NEGATIVE   Ketones, ur NEGATIVE NEGATIVE mg/dL   Protein, ur >=300 (A) NEGATIVE mg/dL   Nitrite NEGATIVE NEGATIVE   Leukocytes,Ua NEGATIVE NEGATIVE   RBC / HPF 0-5 0 - 5 RBC/hpf   WBC, UA 0-5 0 - 5 WBC/hpf   Bacteria, UA RARE (A) NONE SEEN   Mucus PRESENT     Comment: Performed at [facility], [address], [place], [place] [address]  SARS Coronavirus 2 by RT PCR (hospital order, performed in [facility] hospital lab) Nasopharyngeal Nasopharyngeal Swab     Status: None   Collection Time: 08/23/20  7:49 PM   Specimen: Nasopharyngeal Swab  Result Value Ref Range   SARS Coronavirus 2 NEGATIVE NEGATIVE    Comment: (NOTE) SARS-CoV-2 target nucleic acids are NOT DETECTED.  The SARS-CoV-2 RNA is generally detectable in upper and lower respiratory specimens during the acute phase of infection. The lowest concentration of SARS-CoV-2 viral copies this assay can detect is 250 copies / mL. A negative result does not preclude SARS-CoV-2 infection and should not be used as the sole basis for treatment or other patient management decisions.  A negative result may occur with improper specimen collection / handling, submission of specimen other than nasopharyngeal swab, presence of viral mutation(s) within the areas targeted by this assay, and inadequate number of viral copies (<250 copies / mL). A negative result must be combined with clinical observations, patient history, and epidemiological information.  Fact Sheet for Patients:   StrictlyIdeas.no  Fact Sheet for Healthcare Providers: BankingDealers.co.za  This test is not yet approved or  cleared by the [place] FDA and has been authorized for detection and/or diagnosis of SARS-CoV-2 by FDA under an Emergency Use Authorization (EUA).  This EUA will  remain in effect (meaning this test can be used) for the duration of the COVID-19 declaration under Section 564(b)(1) of the Act, 21 U.S.C. section 360bbb-3(b)(1), unless the authorization is terminated or revoked sooner.  Performed at [facility], [place] [address] [place], [place] [address]   Hepatic function panel     Status: None   Collection Time: 08/23/20 10:17 PM  Result Value Ref Range   Total Protein 7.2 6.5 - 8.1 g/dL   Albumin 3.7 3.5 - 5.0 g/dL   AST 17 15 - 41 U/L   ALT 16 0 - 44 U/L   Alkaline Phosphatase 65 38 - 126 U/L   Total Bilirubin 0.4 0.3 - 1.2 mg/dL   Bilirubin, Direct <0.1 0.0 - 0.2 mg/dL   Indirect Bilirubin NOT CALCULATED 0.3 - 0.9 mg/dL  Comment: Performed at [facility], [place] [address] [place], [place] [address]  Magnesium     Status: None   Collection Time: 08/23/20 10:17 PM  Result Value Ref Range   Magnesium 2.0 1.7 - 2.4 mg/dL    Comment: Performed at [facility], [place] [address] [place], [place] [address]  CK     Status: Abnormal   Collection Time: 08/23/20 10:18 PM  Result Value Ref Range   Total CK 454 (H) 49.0 - 397.0 U/L    Comment: Performed at [facility], [place] [address] [place], [place] [address]  Basic metabolic panel     Status: Abnormal   Collection Time: 08/23/20 10:18 PM  Result Value Ref Range   Sodium 143 135 - 145 mmol/L   Potassium 5.8 (H) 3.5 - 5.1 mmol/L   Chloride 112 (H) 98 - 111 mmol/L   CO2 21 (L) 22 - 32 mmol/L   Glucose, Bld 94 70 - 99 mg/dL    Comment: Glucose reference range applies only to samples taken after fasting for at least 8 hours.   BUN 39 (H) 8 - 23 mg/dL   Creatinine, Ser 3.79 (H) 0.61 - 1.24 mg/dL   Calcium 8.1 (L) 8.9 - 10.3 mg/dL   GFR calc non Af Amer 14 (L) >60 mL/min   GFR calc Af Amer 16 (L) >60 mL/min   Anion gap 10 5 - 15    Comment: Performed at [facility] [address] [place], [place] [address]  Comprehensive metabolic panel     Status: Abnormal    Collection Time: 08/24/20  9:08 AM  Result Value Ref Range   Sodium 142 135 - 145 mmol/L   Potassium 5.5 (H) 3.5 - 5.1 mmol/L   Chloride 111 98 - 111 mmol/L   CO2 20 (L) 22 - 32 mmol/L   Glucose, Bld 182 (H) 70 - 99 mg/dL    Comment: Glucose reference range applies only to samples taken after fasting for at least 8 hours.   BUN 37 (H) 8 - 23 mg/dL   Creatinine, Ser 3.69 (H) 0.61 - 1.24 mg/dL   Calcium 8.1 (L) 8.9 - 10.3 mg/dL   Total Protein 6.3 (L) 6.5 - 8.1 g/dL   Albumin 3.2 (L) 3.5 - 5.0 g/dL   AST 15 15 - 41 U/L   ALT 14 0 - 44 U/L   Alkaline Phosphatase 58 38 - 126 U/L   Total Bilirubin 0.7 0.3 - 1.2 mg/dL   GFR calc non Af Amer 14 (L) >60 mL/min   GFR calc Af Amer 17 (L) >60 mL/min   Anion gap 11 5 - 15    Comment: Performed at [facility] [address], [place], [place] [address]  CBC     Status: Abnormal   Collection Time: 08/24/20  9:08 AM  Result Value Ref Range   WBC 7.1 4.0 - 10.5 K/uL   RBC 2.87 (L) 4.22 - 5.81 MIL/uL   Hemoglobin 8.4 (L) 13.0 - 17.0 g/dL   HCT 28.7 (L) 39 - 52 %   MCV 100.0 80.0 - 100.0 fL   MCH 29.3 26.0 - 34.0 pg   MCHC 29.3 (L) 30.0 - 36.0 g/dL   RDW 15.1 11.5 - 15.5 %   Platelets 256 150 - 400 K/uL   nRBC 0.0 0.0 - 0.2 %    Comment: Performed at [facility], [place] [address] [place], [place]  [place]     Status: None   Collection Time: 08/24/20  9:08 AM  Result Value Ref Range   Prothrombin Time 13.8 11.4 - 15.2 seconds   INR 1.1 0.8 - 1.2    Comment: (NOTE) INR goal varies based on device and disease states. Performed at [facility], [place] [address], [place], [place] [address]   APTT     Status: None   Collection Time: 08/24/20  9:08 AM  Result Value Ref Range   aPTT 28 24 - 36 seconds    Comment: Performed at [facility] [address] [place], [place] [address]  Vitamin B12     Status: None   Collection Time: 08/24/20  9:08 AM  Result Value Ref Range   Vitamin B-12 263 180 - 914  pg/mL    Comment: (NOTE) This assay is not validated for testing neonatal or myeloproliferative syndrome specimens for Vitamin B12 levels. Performed at [facility], [place] [address] [place], [place] [address]   Folate, serum, performed at [facility] lab     Status: None   Collection Time: 08/24/20  9:08 AM  Result Value Ref Range   Folate 14.7 >5.9 ng/mL    Comment: Performed at [facility], [place] [address], [place], [place] [address]    DG Chest 2 View  Result Date: 08/23/2020 CLINICAL DATA:  Fluid overload EXAM: CHEST - 2 VIEW COMPARISON:  08/31/2017 FINDINGS: Electronic recording device over the left chest. No significant effusion. Stable cardiomediastinal silhouette. Negative for focal airspace disease, edema or pneumothorax IMPRESSION: No active cardiopulmonary disease. Electronically Signed   By: Donavan Foil M.D.   On: 08/23/2020 20:31   DG ESOPHAGUS W SINGLE CM (SOL OR THIN BA)  Result Date: 08/24/2020 CLINICAL DATA:  Choking sensation.  Difficulty swallowing foods. EXAM: ESOPHOGRAM/BARIUM SWALLOW TECHNIQUE: Single contrast examination was performed using  thin barium. FLUOROSCOPY TIME:  Fluoroscopy Time:  1 minutes, 42 seconds Radiation Exposure Index (if provided by the fluoroscopic device): 14.5 mGy Number of Acquired Spot Images: 0 COMPARISON:  Chest CT dated 08/23/2015 FINDINGS: The pharyngeal phase of swallowing was not assessed in detail but appears grossly unremarkable. Occasional secondary and tertiary contractions of the esophagus are identified, with enough disruption of primary peristaltic waves to indicate nonspecific esophageal dysmotility disorder. No esophageal mass or significant mucosal irregularity is identified on today's single contrast exam. I was not able to fully distend the gastroesophageal junction, cheering diameters of only up to about 0.9 cm. However, a 13 mm barium tablet transiently impacted but eventually cleared from this region without  secondary swallows of water. Accordingly, there is no stenosis of worse than 13 mm, although lesser degree of narrowing is difficult to exclude. Seven IMPRESSION: 1. Nonspecific esophageal dysmotility disorder 2. Incomplete distension at the gastroesophageal junction, with smooth narrowing in this vicinity, although a 13 mm barium tablet was not significantly delayed. Difficult to exclude mild benign stenosis. Electronically Signed   By: Van Clines M.D.   On: 08/24/2020 11:15    Assessment/Plan 1.  Hyperkalemia: aggressive medical management-- improved to 5.5 after Lokelma, IVFs.  Will continue Lokelma BID here.  This should be continued as an OP.  Needs renal diet here too.    2.  CKD IV: Baseline Cr appears to be 3.7.  He is at this baseline, no uremic symptoms.  To have OP eval for access with VVS.  No indications for dialysis at present.  Has  appt with Dr Justin Mend 9/20.    3.  Dysphagia: workup per primary team, getting swallow study  4.  BMM: continue calcitriol   5.  Anemia: Hbg 8.4, check iron and start ESA  6.  Dispo: pending      Madelon Lips 08/24/2020, 2:18 PM

## 2020-08-24 NOTE — Progress Notes (Signed)
SLP Cancellation Note  Patient Details Name: Derrick Hendrix. MRN: [id number] DOB: 1941-10-23   Cancelled treatment:       Reason Eval/Treat Not Completed: Patient at procedure or test/unavailable. Pt with history of solid food dysphagia, able to tolerate liquids. Pt is currently off unit for barium swallow. Will continue efforts to complete oropharyngeal swallow evaluation at bedside.  Derrick Hendrix, [facility], [place] Speech Language Pathologist Office: [phone]  Derrick Hendrix 08/24/2020, 10:39 AM

## 2020-08-24 NOTE — Hospital Course (Addendum)
Admitted 08/23/2020  Allergies: Aspirin Pertinent Hx: Hypertension, A. fib on Eliquis, CKD IV, diabetes mellitus, CVA with left hemiballismus  79 y.o. male p/w hyperkalemia, dysphagia  *Hyperkalemia: In setting of worsening renal function, K up to 7.2   Consults: ***  Meds: *** VTE ppx: *** IVF: *** Diet: ***    Outpatient GI referral  Derrick Hendrix presented to the [facility] on 9/09 at the recommendation of his PCP after he was found to have a potassium of 7.2. Nephrology was consulted and determined that urgent dialysis was not necessary and that his hyperkalemia could be managed medically. He was started on [facility], which improved his potassium but he believed it worsened the intense pruritis associated with his psoriasis. He was switched to Kayexalate with continued improvement of his potassium and improvement of his pruritis. He was also switched from triamcinolone to clobetasol cream for management of his pruritic psoriasis with improvement in his symptoms. He complained of urinary frequency interfering with his sleep which was determined to be related to his use of benadryl and hydroxyzine in the setting of BPH with a history of obstructive symptoms. Once these medications were d/c'd and his dosage of Flomax was increased from 0.4 mg to 0.8 mg, he reported an improvement in these symptoms.

## 2020-08-24 NOTE — Progress Notes (Signed)
Subjective:  Patient states that he feels well at this time, denies any complaints besides diffuse itching from chronic psoriasis and R ankle pain after twisting it 5-6 days ago. He has been ambulatory. Denies CP, palpitations, SOB, abd pain, n/v. He is fidgeting constantly, which he states has been ongoing since prior stroke.  Objective:  Vital signs in last 24 hours: Vitals:   08/24/20 0051 08/24/20 0459 08/24/20 0529 08/24/20 0818  BP: (!) 181/105 140/83  (!) 142/78  Pulse: 89 89  88  Resp: 18 18  18   Temp: 98.2 F (36.8 C) 98.2 F (36.8 C)  98.2 F (36.8 C)  TempSrc:    Oral  SpO2: 98% 94%  96%  Weight:   97.3 kg   Height:   6\' 2"  (1.88 m)    Weight change:   Intake/Output Summary (Last 24 hours) at 08/24/2020 1127 Last data filed at 08/24/2020 0200 Gross per 24 hour  Intake 740 ml  Output 500 ml  Net 240 ml   Physical Exam Vitals and nursing note reviewed.  Constitutional: no acute distress Head: atraumatic ENT: external ears normal Eyes: EOMI Cardiovascular: regular rate and rhythm, normal heart sounds Pulmonary: effort normal, normal breath sounds bilaterally Abdominal: flat, nontender, no rebound tenderness, bowel sounds normal Musculoskeletal: R ankle with moderate tenderness anterior to the malleolus, no pain with manipulation of ankle joint Skin: warm and dry, diffuse scaling pruritic rash Neurological: alert, no focal deficit, mildly dysarthric vs strong accent Psychiatric: normal mood and affect   Assessment/Plan:  Active Problems:   Hyperkalemia   Essential hypertension   Benign prostatic hyperplasia with urinary obstruction   Type 2 diabetes mellitus (HCC)   Atrial fibrillation (HCC)   Acute on chronic renal failure (HCC)  Chronic Renal Failure Hyperkalemia Bone and mineral disease Creatinine 3.69 < 3.79, which is his baseline. Hyperkalemia improving s/p Lokelma 10g x1 and Lasix 20 mg x1. K 7.2 --> 6.4 --> 5.8 --> 5.5. Appointment with Dr.  Justin Mend on 9/20, planning outpatient eval for access with [facility]. No current indication for dialysis.  -nephro following, appreciate recs -continue Lokelma 10g packet BID, to be continued outpatient -renal diet -continue calcitriol 0.67mcg daily  Macrocytic anemia: MCV 105 --> 100. Hgb of 8.4. B12 and Folate unremarkable. Macrocytic anemia could be related to longstanding alcohol use or simply age related. -f/u iron studies -plan to start ESA per nephro -monitor CBC  Psoriasis: Diffuse pruritic rash, followed by dermatology outpatient. Complicating his requirement for dialysis access. - Kenalog 0.1%  Cream TID - Hydroxyzine 10 mg TID PRN for pruritis  Dysphagia: Patient endorses dysphagia for the past 4-5 weeks. He is able to tolerate water without any difficulties, but feels "choked" when eating solids. Patient with PMHx of significant tobacco and alcohol use. Esophagram with nonspecific esophageal dysmotility disorder, no masses, but cannot rule out "mild benign stenosis." -f/u with GI outpatient  Gait Instability: worsening of right lower extremity weakness over the past year. Patient assessed by PT/OT with no recommendation for further follow-up.   Hx of Afib on Eliquis: Currently remains in sinus rhythm.  - Continue home Eliquis 2.5 mg BID  Hypertension:  - Continue home amlodipine 10 mg daily and hydralazine 10 mg TID.   DM2: Last A1c 5.5 6 mos ago.  - Well-controlled without medication.  BPH: - Continue home Flomax 0.4mg  daily  HLD: - Continue home Lipitor 80mg   Diet: Full Liquid Diet DVT Prophylaxis: Eliquis 2.5 mg tab BID Code Status: Full Code  LOS: 0 days   Pearla Dubonnet, Medical Student 08/24/2020, 11:27 AM   Attestation for Student Documentation:  I personally was present and performed or re-performed the history, physical exam and medical decision-making activities of this service and have verified that the service and findings are accurately documented  in the student's note.  Andrew Au, MD 08/24/2020, 1:48 PM

## 2020-08-25 LAB — CBC
HCT: 29.3 % — ABNORMAL LOW (ref 39.0–52.0)
Hemoglobin: 8.8 g/dL — ABNORMAL LOW (ref 13.0–17.0)
MCH: 30.2 pg (ref 26.0–34.0)
MCHC: 30 g/dL (ref 30.0–36.0)
MCV: 100.7 fL — ABNORMAL HIGH (ref 80.0–100.0)
Platelets: 229 10*3/uL (ref 150–400)
RBC: 2.91 MIL/uL — ABNORMAL LOW (ref 4.22–5.81)
RDW: 14.9 % (ref 11.5–15.5)
WBC: 6.6 10*3/uL (ref 4.0–10.5)
nRBC: 0 % (ref 0.0–0.2)

## 2020-08-25 LAB — BASIC METABOLIC PANEL
Anion gap: 9 (ref 5–15)
BUN: 38 mg/dL — ABNORMAL HIGH (ref 8–23)
CO2: 20 mmol/L — ABNORMAL LOW (ref 22–32)
Calcium: 7.9 mg/dL — ABNORMAL LOW (ref 8.9–10.3)
Chloride: 115 mmol/L — ABNORMAL HIGH (ref 98–111)
Creatinine, Ser: 3.58 mg/dL — ABNORMAL HIGH (ref 0.61–1.24)
GFR calc Af Amer: 17 mL/min — ABNORMAL LOW (ref >60)
GFR calc non Af Amer: 15 mL/min — ABNORMAL LOW (ref >60)
Glucose, Bld: 75 mg/dL (ref 70–99)
Potassium: 5.4 mmol/L — ABNORMAL HIGH (ref 3.5–5.1)
Sodium: 144 mmol/L (ref 135–145)

## 2020-08-25 LAB — FERRITIN: Ferritin: 221 ng/mL (ref 24–336)

## 2020-08-25 LAB — IRON AND TIBC
Iron: 64 ug/dL (ref 45–182)
Saturation Ratios: 27 % (ref 17.9–39.5)
TIBC: 235 ug/dL — ABNORMAL LOW (ref 250–450)
UIBC: 171 ug/dL

## 2020-08-25 MED ORDER — CLOBETASOL PROPIONATE 0.05 % EX CREA
TOPICAL_CREAM | Freq: Two times a day (BID) | CUTANEOUS | Status: DC
  Filled 2020-08-25: qty 15

## 2020-08-25 MED ORDER — DARBEPOETIN ALFA 100 MCG/0.5ML IJ SOSY
100.0000 ug | PREFILLED_SYRINGE | INTRAMUSCULAR | Status: DC
  Administered 2020-08-25: 100 ug via SUBCUTANEOUS
  Filled 2020-08-25: qty 0.5

## 2020-08-25 MED ORDER — SODIUM ZIRCONIUM CYCLOSILICATE 10 G PO PACK: 10.0000 g | PACK | Freq: Three times a day (TID) | ORAL | Status: DC

## 2020-08-25 MED ORDER — SODIUM POLYSTYRENE SULFONATE 15 GM/60ML PO SUSP: 15.0000 g | Freq: Three times a day (TID) | ORAL | Status: DC

## 2020-08-25 MED ORDER — TAMSULOSIN HCL 0.4 MG PO CAPS
0.8000 mg | ORAL_CAPSULE | Freq: Every day | ORAL | Status: DC
  Administered 2020-08-25 – 2020-08-26 (×2): 0.8 mg via ORAL
  Filled 2020-08-25: qty 2

## 2020-08-25 MED ORDER — SODIUM POLYSTYRENE SULFONATE 15 GM/60ML PO SUSP
45.0000 g | Freq: Three times a day (TID) | ORAL | Status: DC
  Administered 2020-08-25: 45 g via ORAL
  Filled 2020-08-25 (×2): qty 180

## 2020-08-25 MED ORDER — CAMPHOR-MENTHOL 0.5-0.5 % EX LOTN
TOPICAL_LOTION | CUTANEOUS | Status: DC | PRN
  Filled 2020-08-25: qty 222

## 2020-08-25 NOTE — Progress Notes (Addendum)
[facility] Progress Note    Assessment/ Plan:    1.  Hyperkalemia: aggressive medical management-- improved to 5.5 after Lokelma, IVFs.  Increase lokelma to TID since K is still a little high.  This should be continued as an OP.  Needs renal diet here too.     2.  CKD IV: Baseline Cr appears to be 3.7.  He is at this baseline, no uremic symptoms.  To have OP eval for access with VVS.  No indications for dialysis at present.  Has appt with Dr Justin Mend 9/20.    3.  Dysphagia: workup per primary team, getting swallow study showing nonspecific esophageal motility disorder.  4.  BMM: continue calcitriol   5.  Anemia: Hbg 8.4, check iron and start ESA with aranesp 100 q sat  6.  Dispo: pending.  Nothing further to add.  Will sign off.  Call with questions.  Subjective:    Multiple complaints this AM, specifically about dysphagia and his rash.     Objective:   BP 138/60 (BP Location: Left Arm)   Pulse 96   Temp 98.6 F (37 C) (Oral)   Resp 18   Ht 6\' 2"  (1.88 m)   Wt 96.1 kg   SpO2 93%   BMI 27.20 kg/m   Intake/Output Summary (Last 24 hours) at 08/25/2020 1237 Last data filed at 08/25/2020 0900 Gross per 24 hour  Intake 780 ml  Output 0 ml  Net 780 ml   Weight change: -1.2 kg  Physical Exam: GEN NAD, sitting in bed HEENT EOMI PERRL NECK no JVD PULM clear bilaterally  CV irregular ABD soft EXT trace R LE edema NEURO AAO x 3, L sided uncontrollable movements SKIN + diffuse psoriatic rash MSK no effusions  Imaging: DG Chest 2 View  Result Date: 08/23/2020 CLINICAL DATA:  Fluid overload EXAM: CHEST - 2 VIEW COMPARISON:  08/31/2017 FINDINGS: Electronic recording device over the left chest. No significant effusion. Stable cardiomediastinal silhouette. Negative for focal airspace disease, edema or pneumothorax IMPRESSION: No active cardiopulmonary disease. Electronically Signed   By: Donavan Foil M.D.   On: 08/23/2020 20:31   DG ESOPHAGUS W SINGLE CM  (SOL OR THIN BA)  Result Date: 08/24/2020 CLINICAL DATA:  Choking sensation.  Difficulty swallowing foods. EXAM: ESOPHOGRAM/BARIUM SWALLOW TECHNIQUE: Single contrast examination was performed using  thin barium. FLUOROSCOPY TIME:  Fluoroscopy Time:  1 minutes, 42 seconds Radiation Exposure Index (if provided by the fluoroscopic device): 14.5 mGy Number of Acquired Spot Images: 0 COMPARISON:  Chest CT dated 08/23/2015 FINDINGS: The pharyngeal phase of swallowing was not assessed in detail but appears grossly unremarkable. Occasional secondary and tertiary contractions of the esophagus are identified, with enough disruption of primary peristaltic waves to indicate nonspecific esophageal dysmotility disorder. No esophageal mass or significant mucosal irregularity is identified on today's single contrast exam. I was not able to fully distend the gastroesophageal junction, cheering diameters of only up to about 0.9 cm. However, a 13 mm barium tablet transiently impacted but eventually cleared from this region without secondary swallows of water. Accordingly, there is no stenosis of worse than 13 mm, although lesser degree of narrowing is difficult to exclude. Seven IMPRESSION: 1. Nonspecific esophageal dysmotility disorder 2. Incomplete distension at the gastroesophageal junction, with smooth narrowing in this vicinity, although a 13 mm barium tablet was not significantly delayed. Difficult to exclude mild benign stenosis. Electronically Signed   By: Van Clines M.D.   On: 08/24/2020 11:15    Labs:  BMET Recent Labs  Lab 08/23/20 1535 08/23/20 1815 08/23/20 1836 08/23/20 2218 08/24/20 0908 08/25/20 0344  NA 144 141 141 143 142 144  K 7.2* 6.2* 6.4* 5.8* 5.5* 5.4*  CL 111 110 111 112* 111 115*  CO2 22 21*  --  21* 20* 20*  GLUCOSE 105* 269* 271* 94 182* 75  BUN 36* 37* 47* 39* 37* 38*  CREATININE 3.78* 3.75* 4.10* 3.79* 3.69* 3.58*  CALCIUM 8.4* 7.9*  --  8.1* 8.1* 7.9*   CBC Recent Labs   Lab 08/23/20 1815 08/23/20 1836 08/24/20 0908 08/25/20 0344  WBC 7.5  --  7.1 6.6  HGB 9.1* 9.9* 8.4* 8.8*  HCT 31.7* 29.0* 28.7* 29.3*  MCV 105.0*  --  100.0 100.7*  PLT 299  --  256 229    Medications:    . amLODipine  10 mg Oral Daily  . apixaban  2.5 mg Oral BID  . atorvastatin  80 mg Oral Daily  . calcitRIOL  0.25 mcg Oral Daily  . clobetasol cream   Topical BID  . clonazePAM  0.25 mg Oral TID  . fludrocortisone  100 mcg Oral BID WC  . hydrALAZINE  10 mg Oral TID  . polyethylene glycol  17 g Oral Daily  . sodium zirconium cyclosilicate  10 g Oral TID  . tamsulosin  0.4 mg Oral QPC supper  . triamcinolone cream   Topical BID      Madelon Lips MD 08/25/2020, 12:37 PM

## 2020-08-25 NOTE — Progress Notes (Addendum)
Patient refused to take lokelma and reported that it makes him itch, MD has been notified. RN educated patient and will continue to monitor patient

## 2020-08-25 NOTE — Progress Notes (Signed)
Patient telemetry has come off multiple times due to constant itching from rash present all over body from head to toe, and patient refuses to keep telemetry leads in place due to itching. MD has been notified, RN will continue to monitor patient

## 2020-08-25 NOTE — Progress Notes (Signed)
Subjective:  Patient this morning complains of severe and diffuse itching rash which he attributes to [facility]. He states it worsened acutely this morning. Also notes continued ankle from when he twisted it prior to arrival.   Objective:  Vital signs in last 24 hours: Vitals:   08/24/20 1706 08/24/20 2100 08/25/20 0425 08/25/20 0855  BP: (!) 145/75 131/69 (!) 137/114 138/60  Pulse:  90 91 96  Resp:  18 20 18   Temp:  98.5 F (36.9 C) 98 F (36.7 C) 98.6 F (37 C)  TempSrc:  Oral Oral Oral  SpO2:  95% 95% 93%  Weight:  96.1 kg    Height:       Weight change: -1.2 kg  Intake/Output Summary (Last 24 hours) at 08/25/2020 1439 Last data filed at 08/25/2020 1100 Gross per 24 hour  Intake 1020 ml  Output 0 ml  Net 1020 ml   Physical Exam Constitutional: acute distress from itching Head: atraumatic ENT: external ears normal Eyes: EOMI Cardiovascular: regular rate and rhythm, normal heart sounds Pulmonary: effort normal Abdominal: flat, bowel sounds normal Musculoskeletal: R ankle with moderate tenderness anterior to the malleolus, nontender posterior to malleolus, no pain with manipulation of ankle joint Skin: warm and dry, diffuse scaling pruritic rash affecting all extremities, chest, back, and scalp Neurological: alert, continuous movements of L arm and less so to lower extremities, mildly dysarthric vs strong accent Psychiatric: distressed  Assessment/Plan:  Active Problems:   Hyperkalemia   Essential hypertension   Benign prostatic hyperplasia with urinary obstruction   Type 2 diabetes mellitus (HCC)   Atrial fibrillation (HCC)   Acute on chronic renal failure (HCC)  Chronic Renal Failure Hyperkalemia Bone and mineral disease Creatinine at his baseline. Hyperkalemia improved s/p Lokelma x1 yesterday and the day before, 5.5 now from 7.2 at presentation. However, he believes he had an allergy to [facility]. Appointment with Dr. Justin Mend on 9/20, planning outpatient eval  for access with VVS. No current indication for dialysis.  -nephro signed off, appreciate recs -switch Lokelma to [facility] for itching, 45g today then 30g MWF upon discharge per on call nephrologist -renal diet, soft foods -continue calcitriol 0.21mcg daily  Severe rash, possibly psoriasis Patient has had a diffuse scaling prurutic rash for the past 8-10 weeks, has seen dermatology for this outpatient and workup so far has included biopsy. However, records unavailable in our EMR. Prescribed Kenalog and hydroxyzine at home, states that he also takes benadryl. Itching acutely worsened this morning, patient attributes this to [facility]. -switch kenalog to clobetasol 0.05% BID -start Sarna cream in between clobetasol applications -switch Lokelma to kayexelate  BPH with urinary retention Patient complains of frequent urination. On Flomax 0.4mg  at home. -stop benadryl and hydroxizine -c/w Flomax 0.4mg   - consider increasing to 0.8mg  but monitor for orthostatics  Macrocytic anemia: MCV 105 --> 100. Hgb of 8.4. B12 and Folate unremarkable. Macrocytic anemia could be related to longstanding alcohol use or simply age related. -start Aranesp 100 q Saturday per nephro -monitor CBC  Dysphagia: Patient endorses dysphagia for the past 4-5 weeks. He is able to tolerate water without any difficulties, but feels "choked" when eating solids. Patient with PMHx of significant tobacco and alcohol use. Esophagram with nonspecific esophageal dysmotility disorder, no masses, but cannot rule out "mild benign stenosis." -f/u with GI outpatient -diet with soft foods  Gait Instability: worsening of right lower extremity weakness over the past year. Patient assessed by PT/OT with no recommendation for further follow-up.   Hx of  Afib on Eliquis: Currently remains in sinus rhythm.  - Continue home Eliquis 2.5 mg BID  Hypertension:  - Continue home amlodipine 10 mg daily and hydralazine 10 mg TID.   DM2: Last  A1c 5.5 6 mos ago.  - Well-controlled without medication.  HLD: - Continue home Lipitor 80mg   Diet: Full Liquid Diet DVT Prophylaxis: Eliquis 2.5 mg tab BID Code Status: Full Code   LOS: 1 day   Diet: renal, soft IVF: none VTE: Eliquis Prior to Admission Living Arrangement: home Anticipated Discharge Location: home Barriers to Discharge: itching, hyperkalemia Dispo: Anticipated discharge in approximately 1-2 day(s).   Andrew Au, MD 08/25/2020, 2:39 PM

## 2020-08-25 NOTE — Progress Notes (Signed)
RN messaged MD on call again to see if Hydralazine needs to be given, awaiting response.   Eleanora Neighbor, RN

## 2020-08-25 NOTE — Evaluation (Signed)
Clinical/Bedside Swallow Evaluation Patient Details  Name: Derrick Hendrix. MRN: [id number] Date of Birth: [birth year]  Today's Date: 08/25/2020 Time: SLP Start Time (ACUTE ONLY): 1633 SLP Stop Time (ACUTE ONLY): 1645 SLP Time Calculation (min) (ACUTE ONLY): 12 min  Past Medical History:  Past Medical History:  Diagnosis Date  . Abscess of foot without toes, left 08/28/2017  . Chronic kidney disease (CKD), stage III (moderate)   . Constipation 11/07/2014  . Daily headache    "lately" (04/17/2016)  . Depression   . Dizziness 03/07/2016  . Headache 06/26/2014  . Heart murmur   . Hyperkalemia 10/05/2018  . Hyperlipemia 11/11/2016  . Hypertension   . Neuropathic pain 02/06/2015  . Osteomyelitis ([place])   . PAD (peripheral artery disease) ([place])   . Paronychia of great toe, right   . Preop cardiovascular exam   . Right foot pain 02/09/2017  . Stable angina ([place]) 06/27/2014  . Stroke ([place]) 03/2016   hx of PAD; j"ust lots of headaches since" (04/17/2016)  . Vitamin B12 deficiency 06/27/2014  . Weight loss, unintentional 06/27/2014   Past Surgical History:  Past Surgical History:  Procedure Laterality Date  . ABDOMINAL AORTOGRAM W/LOWER EXTREMITY N/A 03/02/2017   Procedure: Abdominal Aortogram w/Lower Extremity;  Surgeon: Conrad Blacksville, MD;  Location: [place] [facility];  Service: Cardiovascular;  Laterality: N/A;  . EP IMPLANTABLE DEVICE N/A 03/04/2016   Procedure: Loop Recorder Insertion;  Surgeon: Thompson Grayer, MD;  Location: [place] [facility];  Service: Cardiovascular;  Laterality: N/A;  . PERIPHERAL VASCULAR INTERVENTION Right 03/06/2017   Procedure: Peripheral Vascular Intervention;  Surgeon: Elam Dutch, MD;  Location: [place] [facility];  Service: Cardiovascular;  Laterality: Right;  SFA   HPI:  Pt is an 79 year old man admitted from the clinic with hyperkalemia, worsening renal function and dysphagia. PMH: CVA (2017), HTN, unintentional weight loss. CKD IV, PAD, psoriasis, afib, DM2,  cerebellar infarction. Per chart pt has sensation of choking for several weeks and difficulty with solids. Barium esophagram 9/10 revealed nonspecific esophageal motility disorder, Incomplete distension at the gastroesophageal junction, with smooth narrowing in this vicinity, although a 13 mm barium tablet was not significantly delayed. Difficult to exclude mild benign stenosis.   Assessment / Plan / Recommendation Clinical Impression  No outward indications of aspiration or globus sensation during observation with thin and solid textures. Reviewed report from barium swallow yesterday per documentation (but encouraged pt to speak with MD). Educated re: esophageal precautions. Pt was noted to have continuous involuntary movements of shoulders (primarily left), arm and leg similar to a type of chorea- pt acknowledged but stated he has not had a dianosis pertaining. He agreed chopped meats may help as he does not have upper dentition. No further ST needed.  SLP Visit Diagnosis: Dysphagia, unspecified (R13.10)    Aspiration Risk  Mild aspiration risk    Diet Recommendation Dysphagia 3 (Mech soft);Thin liquid   Liquid Administration via: Straw;Cup Medication Administration: Whole meds with liquid Supervision: Patient able to self feed Compensations: Slow rate;Small sips/bites Postural Changes: Seated upright at 90 degrees;Remain upright for at least 30 minutes after po intake    Other  Recommendations Oral Care Recommendations: Oral care BID   Follow up Recommendations None      Frequency and Duration            Prognosis        Swallow Study   General Date of Onset: 08/23/20 HPI: Pt is an 79 year old  man admitted from the clinic with hyperkalemia, worsening renal function and dysphagia. PMH: CVA (2017), HTN, unintentional weight loss. CKD IV, PAD, psoriasis, afib, DM2, cerebellar infarction. Per chart pt has sensation of choking for several weeks and difficulty with solids. Barium  esophagram 9/10 revealed nonspecific esophageal motility disorder, Incomplete distension at the gastroesophageal junction, with smooth narrowing in this vicinity, although a 13 mm barium tablet was not significantly delayed. Difficult to exclude mild benign stenosis. Type of Study: Bedside Swallow Evaluation Diet Prior to this Study: Thin liquids;Other (Comment) (soft) Temperature Spikes Noted: No Respiratory Status: Room air History of Recent Intubation: No Behavior/Cognition: Alert;Cooperative;Pleasant mood Oral Cavity Assessment: Within Functional Limits Oral Care Completed by SLP: No Oral Cavity - Dentition: Missing dentition Vision: Functional for self-feeding Self-Feeding Abilities: Able to feed self Patient Positioning: Upright in bed Baseline Vocal Quality: Normal Volitional Cough: Strong Volitional Swallow: Able to elicit    Oral/Motor/Sensory Function Overall Oral Motor/Sensory Function: Within functional limits   Ice Chips Ice chips: Not tested   Thin Liquid Thin Liquid: Within functional limits Presentation: Straw;Cup    Nectar Thick Nectar Thick Liquid: Not tested   Honey Thick Honey Thick Liquid: Not tested   Puree Puree: Not tested   Solid     Solid: Within functional limits      Derrick Hendrix 08/25/2020,5:03 PM   Derrick Hendrix Caroli.Ed Risk analyst [phone] Office [phone]

## 2020-08-25 NOTE — Progress Notes (Signed)
Pt's BP is 147/80 and pulse is low at 49. RN messaged MD on call to see if they want Hydralazine 10mg  to still be given. Awaiting response.   Eleanora Neighbor, RN

## 2020-08-26 LAB — BASIC METABOLIC PANEL
Anion gap: 14 (ref 5–15)
BUN: 40 mg/dL — ABNORMAL HIGH (ref 8–23)
CO2: 19 mmol/L — ABNORMAL LOW (ref 22–32)
Calcium: 7.7 mg/dL — ABNORMAL LOW (ref 8.9–10.3)
Chloride: 115 mmol/L — ABNORMAL HIGH (ref 98–111)
Creatinine, Ser: 3.75 mg/dL — ABNORMAL HIGH (ref 0.61–1.24)
GFR calc Af Amer: 16 mL/min — ABNORMAL LOW (ref >60)
GFR calc non Af Amer: 14 mL/min — ABNORMAL LOW (ref >60)
Glucose, Bld: 84 mg/dL (ref 70–99)
Potassium: 4.3 mmol/L (ref 3.5–5.1)
Sodium: 148 mmol/L — ABNORMAL HIGH (ref 135–145)

## 2020-08-26 LAB — CBC
HCT: 31.3 % — ABNORMAL LOW (ref 39.0–52.0)
Hemoglobin: 9.5 g/dL — ABNORMAL LOW (ref 13.0–17.0)
MCH: 30.4 pg (ref 26.0–34.0)
MCHC: 30.4 g/dL (ref 30.0–36.0)
MCV: 100 fL (ref 80.0–100.0)
Platelets: 219 10*3/uL (ref 150–400)
RBC: 3.13 MIL/uL — ABNORMAL LOW (ref 4.22–5.81)
RDW: 15.2 % (ref 11.5–15.5)
WBC: 8.7 10*3/uL (ref 4.0–10.5)
nRBC: 0 % (ref 0.0–0.2)

## 2020-08-26 MED ORDER — CLOBETASOL PROPIONATE 0.05 % EX CREA: TOPICAL_CREAM | Freq: Two times a day (BID) | CUTANEOUS | 0 refills | Status: DC

## 2020-08-26 MED ORDER — DARBEPOETIN ALFA 100 MCG/0.5ML IJ SOSY
100.0000 ug | PREFILLED_SYRINGE | INTRAMUSCULAR | 3 refills | Status: DC
Start: 2020-09-01 — End: 2020-12-19

## 2020-08-26 MED ORDER — SODIUM POLYSTYRENE SULFONATE 15 GM/60ML PO SUSP: 30.0000 g | ORAL | 0 refills | Status: DC

## 2020-08-26 MED ORDER — TAMSULOSIN HCL 0.4 MG PO CAPS: 0.8000 mg | ORAL_CAPSULE | Freq: Every day | ORAL | 0 refills | Status: DC

## 2020-08-26 MED ORDER — CAMPHOR-MENTHOL 0.5-0.5 % EX LOTN: TOPICAL_LOTION | CUTANEOUS | 0 refills | Status: DC | PRN

## 2020-08-26 MED ORDER — SODIUM POLYSTYRENE SULFONATE 15 GM/60ML PO SUSP: 30.0000 g | ORAL | Status: DC

## 2020-08-26 NOTE — Progress Notes (Addendum)
Subjective:  Patient reports that he feels "great" this morning and would like to go home.  He says that he is doing well with the change to clobetasol cream for his psoriasis. "It let me get some rest." He says that his itching is overall improved. He also reports improvement in his urinary symptoms, stating that discontinuing the benadryl and hydroxyzine helped "slow things down." He reports that he prefers the Kayexalate to the [facility] he was on previously. He endorses frequent, small bowel movements, but that these are not bothersome to him.  He continues to note ankle pain from a pre-hospital injury, but states that it does not interfere with his ambulation or daily activities.  He is requesting more information regarding a low-potassium diet "so I don't go back to the same old stuff."   Objective:  Vital signs in last 24 hours: Vitals:   08/25/20 1651 08/25/20 2110 08/26/20 0515 08/26/20 0913  BP: (!) 157/78 (!) 147/80 (!) 155/90 (!) 158/77  Pulse: 87 (!) 49 (!) 101 89  Resp: 18 18 18 18   Temp: 98.1 F (36.7 C) 98.3 F (36.8 C) 98.5 F (36.9 C) 98.1 F (36.7 C)  TempSrc: Oral Oral Oral Oral  SpO2: 95% 99% 94% 95%  Weight:  95 kg    Height:       Weight change: -1.1 kg  Intake/Output Summary (Last 24 hours) at 08/26/2020 4081 Last data filed at 08/26/2020 0834 Gross per 24 hour  Intake 1500 ml  Output 0 ml  Net 1500 ml   Physical Exam Vitals and nursing note reviewed.  Constitutional:      General: He is not in acute distress. Cardiovascular:     Rate and Rhythm: Normal rate and regular rhythm.     Heart sounds: Normal heart sounds.  Pulmonary:     Effort: Pulmonary effort is normal.     Breath sounds: Normal breath sounds.  Skin:    Findings: Rash present. Rash is scaling (diffuse and pruritic. Covering all extremeties, chest, and scalp).  Neurological:     Mental Status: He is alert.       Assessment/Plan:  Active Problems:   Hyperkalemia   Essential  hypertension   Benign prostatic hyperplasia with urinary obstruction   Type 2 diabetes mellitus (HCC)   Atrial fibrillation (HCC)   Acute on chronic renal failure (HCC)    Chronic Renal Failure Hyperkalemia 2/2 Chronic Renal Failure, Bone and mineral disease: Creatinine at his baseline. Hyperkalemia improved s/p Lokelma x2 and kayexalate x1, 4.3 now from 7.2 at presentation. Appointment with Dr. Justin Mend on 9/20, planning outpatient eval for access with VVS. No current indication for dialysis.  - Nephro signed off, appreciate recs - Continue Kayexelate for itching, 45g yesterday then 30g MWF upon discharge per nephrologist recommendation - Renal diet, soft foods - Continue calcitriol 0.69mcg daily - Will provide information on low K diet in discharge instructions. - PCP follow-up on Friday to recheck K and to educate regarding low potassium diet -Likely DC today  Severe rash, possibly psoriasis: Patient has had a diffuse scaling prurutic rash for the past 8-10 weeks, has seen dermatology for this outpatient and workup so far has included biopsy. However, records unavailable in our EMR. Itching improved on clobetasol and Sarna cream.  - Continue clobetasol 0.05% BID - Continue Sarna cream in between clobetasol applications  BPH with urinary retention: Urinary symptoms improved off of benadryl and hydroxyzine. Flomax increased to 0.8mg /daily yesterday.  - Continue flomax 0.8mg   Macrocytic anemia: MCV 105 --> 100. Hgb of 8.4. B12 and Folate unremarkable. Macrocytic anemia could be related to longstanding alcohol use or simply age related. - Aranesp 100 mcg administered yesterday per nephro recommendation  Dysphagia: Patient endorses dysphagia for the past 4-5 weeks. He is able to tolerate water without any difficulties, but feels "choked" when eating solids. Patient with PMHx of significant tobacco and alcohol use. Esophagram with nonspecific esophageal dysmotility disorder, no masses,  but cannot rule out "mild benign stenosis." -f/u with GI outpatient -diet with soft foods  Gait Instability: worsening of right lower extremity weakness over the past year. Patient assessed by PT/OT with no recommendation for further follow-up.   Hx of Afib on Eliquis: Currently remains in sinus rhythm.  - Continue home Eliquis 2.5 mg BID  Hypertension:  - Continue home amlodipine 10 mg daily and hydralazine 10 mg TID.   DM2: Last A1c 5.5 6 mos ago.  - Well-controlled without medication.  HLD: - Continue home Lipitor 80mg    LOS: 2 days   Pearla Dubonnet, Medical Student 08/26/2020, 9:22 AM   Attestation for Student Documentation:  I personally was present and performed or re-performed the history, physical exam and medical decision-making activities of this service and have verified that the service and findings are accurately documented in the student's note.  Dewayne Hatch, MD 08/26/2020, 5:02 PM

## 2020-08-26 NOTE — Discharge Summary (Signed)
Name: Derrick Hendrix. MRN: [id number] DOB: 10/14/1941 79 y.o. PCP: Derrick Rosenthal, MD  Date of Admission: 08/23/2020  5:59 PM Date of Discharge: 08/26/2020 Attending Physician: Dr. Jimmye Hendrix  Discharge Diagnosis: 1. Active Problems:   Hyperkalemia   Essential hypertension   Benign prostatic hyperplasia with urinary obstruction   Type 2 diabetes mellitus (HCC)   Atrial fibrillation (HCC)   Acute on chronic renal failure [facility])  Discharge Medications: Allergies as of 08/26/2020      Reactions   Aspirin Other (See Comments)   Nosebleeds       Medication List    STOP taking these medications   acetaminophen 500 MG tablet Commonly known as: TYLENOL   hydrOXYzine 10 MG tablet Commonly known as: ATARAX/VISTARIL   triamcinolone cream 0.1 % Commonly known as: KENALOG   triamcinolone ointment 0.1 % Commonly known as: KENALOG     TAKE these medications   amLODipine 10 MG tablet Commonly known as: NORVASC TAKE ONE TABLET BY MOUTH DAILY   atorvastatin 80 MG tablet Commonly known as: LIPITOR TAKE ONE TABLET BY MOUTH DAILY   Blood Pressure Kit Devi Please check blood pressure twice a day. In the morning and evening. Please record numbers on a sheet of paper. Blood pressure parameters are 120-150/80-90   calcitRIOL 0.25 MCG capsule Commonly known as: ROCALTROL Take 0.25 mcg by mouth daily.   camphor-menthol lotion Commonly known as: SARNA Apply topically as needed for itching.   clobetasol cream 0.05 % Commonly known as: TEMOVATE Apply topically 2 (two) times daily.   clonazePAM 0.25 MG disintegrating tablet Commonly known as: KLONOPIN TAKE ONE TABLET BY MOUTH THREE TIMES A DAY What changed: when to take this   Darbepoetin Alfa 100 MCG/0.5ML Sosy injection Commonly known as: ARANESP Inject 0.5 mLs (100 mcg total) into the skin every Saturday at 6 PM. Start taking on: September 01, 2020   Decubi-Vite Caps Take 1 capsule by mouth daily.   docusate sodium 100  MG capsule Commonly known as: Colace Take 1 capsule (100 mg total) by mouth daily.   Eliquis 2.5 MG Tabs tablet Generic drug: apixaban TAKE ONE TABLET BY MOUTH TWICE A DAY What changed: how much to take   finasteride 5 MG tablet Commonly known as: PROSCAR TAKE ONE TABLET BY MOUTH DAILY   fludrocortisone 0.1 MG tablet Commonly known as: FLORINEF TAKE ONE TABLET BY MOUTH TWICE A DAY   furosemide 20 MG tablet Commonly known as: LASIX TAKE ONE TABLET BY MOUTH DAILY   hydrALAZINE 10 MG tablet Commonly known as: APRESOLINE TAKE ONE TABLET BY MOUTH THREE TIMES A DAY   nitroGLYCERIN 0.4 MG SL tablet Commonly known as: NITROSTAT Place 1 tablet (0.4 mg total) under the tongue every 5 (five) minutes x 3 doses as needed for chest pain.   predniSONE 10 MG tablet Commonly known as: DELTASONE Take 10 mg by mouth daily.   sodium polystyrene 15 GM/60ML suspension Commonly known as: KAYEXALATE Take 120 mLs (30 g total) by mouth 3 (three) times a week.   tamsulosin 0.4 MG Caps capsule Commonly known as: FLOMAX Take 2 capsules (0.8 mg total) by mouth daily. What changed: See the new instructions.       Disposition and follow-up:   [name] was discharged from [facility] in Stable condition.  At the hospital follow up visit please address:  1. Hyperkalemia 2/2 reduced reduced renal excretion: Please make sure patient is complaint to Kayexalate and follow up with has  appt with Dr Derrick Hendrix 9/20. Repeat BMP. (Patient does not tolerate lokelma because it worsening of pruritus)  2. Dysphagia: Needs outpt GI follow up/referral for esophageal dysmotility shown on esophagogram.  3- Intense pruritic lesions: likely 2/2 psoriasis. Benadryl and hydroxyzine stopped at this hospitalization due to worsening of urinary retention/anticholinergic side effect. Patient had sever pruritic rash and restlessness because of that. Started on Clobetazol BID PRN and Sarna as needed.  We recommend against using Hydroxyzine and diphenhydramine for pruritis in setting of BPH with hx of obstructive symptoms. Please make sure he follows up with dermatology.   4.  Labs / imaging needed at time of follow-up: BMP  5.  Pending labs/ test needing follow-up: None  Follow-up Appointments:  Follow-up Information    Derrick Rosenthal, MD. Schedule an appointment as soon as possible for a visit in 5 day(s).   Specialty: Internal Medicine Contact information: [address] [place] [address] [phone]        Derrick Oh, MD. Call in 8 day(s).   Specialty: Nephrology Contact information: [place] [address] [phone]               Hospital Course by problem list:  79 year old male with past medical history of chronic kidney disease stage IV, hypertension, peripheral arterial disease, diffuse psoriasis, A. 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  He presented to the emergency department after referral from PCP due to hyperkalemia.     1- Hyperkalemia 2/2 decreased renal excretion: Derrick Hendrix presented to the [facility] on 9/[facility] at the recommendation of his PCP after he was found to have a potassium of 7.2. No EKG changes. His BUN and Cr mildly higher than his baseline but he did not have uremic symptoms. Nephrology was consulted and determined that urgent dialysis was not necessary and that his hyperkalemia could be managed medically. He was started on [facility], which improved his potassium but he believed it worsened the intense pruritis associated with his psoriasis and stopped Lokelma and  switched to Kayexalate with continued improvement of his potassium and improvement of his pruritis. He follows up with nephrology- DerrickWebb- outpatient who has discussed dialysis for future. access was going to be placed but this did not happened yet due to diffuse psoriasis rash and hemiballismus movements in L arm, access would  need to be on the R. Patient discharged home with Kayeoxalate, los potassium diet and home furosemide and to follow up with PCP in 5 days to repeat BMP and to see Dr. Justin Hendrix on 9/20.    Dysphagia: Patient reports history of solid dysphagia for past 4, 5 weeks.  He has history of significant tobacco use and alcohol use.  Work-up was started by PCP before this admission and esophagogram performed during this hospitalization which showed nonspecific esophageal dysmotility disorders without mass.  However could not rule out mild benign stenosis.  Patient was on soft diet during this hospitalization.  He will need to follow-up with GI outpatient.   BPH: Patient complains of frequent urination and incontinence. Obstructive symptoms improved w stopped benadryl and diphenhydramine and increased Flomax dose  to 0.8 mg daily.  Recommended to avoid Benadryl and diphenhydramine.  Diffuse, severe, scaling, pruritic rash: Patient reports that he has been dealing with this rash for past 2 months.  Biopsy was performed by dermatology outpatient.  (Records not available in EMR) he was prescribed Kenalog and hydroxyzine for itching.  He also started to  take Benadryl recently due to worsening of itching.  During this hospitalization, his pruritus became uncontrollable  Discharge Vitals:   BP (!) 146/75 (BP Location: Right Arm)   Pulse 89   Temp 98.1 F (36.7 C) (Oral)   Resp 20   Ht $R'6\' 2"'IF$  (1.88 m)   Wt 95 kg   SpO2 93%   BMI 26.89 kg/m   Pertinent Labs, Studies, and Procedures:  BMP Latest Ref Rng & Units 08/26/2020 08/25/2020 08/24/2020  Glucose 70 - 99 mg/dL 84 75 182(H)  BUN 8 - 23 mg/dL 40(H) 38(H) 37(H)  Creatinine 0.61 - 1.24 mg/dL 3.75(H) 3.58(H) 3.69(H)  BUN/Creat Ratio 10 - 24 - - -  Sodium 135 - 145 mmol/L 148(H) 144 142  Potassium 3.5 - 5.1 mmol/L 4.3 5.4(H) 5.5(H)  Chloride 98 - 111 mmol/L 115(H) 115(H) 111  CO2 22 - 32 mmol/L 19(L) 20(L) 20(L)  Calcium 8.9 - 10.3 mg/dL 7.7(L) 7.9(L) 8.1(L)                                    Component     Latest Ref Rng & Units 08/23/2020         3:35 PM  Sodium     135 - 145 mmol/L 144  Potassium     3.5 - 5.1 mmol/L 7.2 (HH)  Chloride     98 - 111 mmol/L 111  BUN     8 - 23 mg/dL 36 (H)  Creatinine     0.61 - 1.24 mg/dL 3.78 (H)  Glucose     70 - 99 mg/dL 105 (H)  Calcium Ionized     1.15 - 1.40 mmol/L   TCO2     22 - 32 mmol/L   Hemoglobin     13.0 - 17.0 g/dL   HCT     39 - 52 %    EXAM: ESOPHOGRAM/BARIUM SWALLOW  TECHNIQUE: Single contrast examination was performed using  thin barium.  FLUOROSCOPY TIME:  Fluoroscopy Time:  1 minutes, 42 seconds  Radiation Exposure Index (if provided by the fluoroscopic device): 14.5 mGy  Number of Acquired Spot Images: 0  COMPARISON:  Chest CT dated 08/23/2015  FINDINGS: The pharyngeal phase of swallowing was not assessed in detail but appears grossly unremarkable.  Occasional secondary and tertiary contractions of the esophagus are identified, with enough disruption of primary peristaltic waves to indicate nonspecific esophageal dysmotility disorder. No esophageal mass or significant mucosal irregularity is identified on today's single contrast exam.  I was not able to fully distend the gastroesophageal junction, cheering diameters of only up to about 0.9 cm. However, a 13 mm barium tablet transiently impacted but eventually cleared from this region without secondary swallows of water. Accordingly, there is no stenosis of worse than 13 mm, although lesser degree of narrowing is difficult to exclude. Seven  IMPRESSION: 1. Nonspecific esophageal dysmotility disorder 2. Incomplete distension at the gastroesophageal junction, with smooth narrowing in this vicinity, although a 13 mm barium tablet was not significantly delayed. Difficult to exclude mild benign stenosis.    Discharge Instructions: Discharge Instructions    Call MD for:  persistant dizziness or  light-headedness   Complete by: As directed    Call MD for:  persistant nausea and vomiting   Complete by: As directed    Diet - low sodium heart healthy   Complete by: As directed    Discharge instructions  Complete by: As directed    Thank you for allowing [place] to provide your care at [facility]. We are glad that you feel better.  Please pick up your medications from pharmacy and take them as below. (If you have any issue with getting your medications tomorrow, please let your physician know). Please take Kayexalate, 1 tablet, on mondays, wednesdays and Fridays. Please stop Benadryl and hydroxyzine. Those medications make your urinary symptoms worse. Stop using Triamcinolone and use Clobetasol twice a day as needed for itching and Sarna 2-3 times a day as needed meanwhile. Take rest of your medications as before. Follow up with PCP next Friday. Follow up your kidney doctor as scheduled for you on 9/20.  Follow low potassium diet. (Eat less tomato, banana as they have have so much potassium).  Should you have any questions or concerns please call the internal medicine clinic at [phone].   Increase activity slowly   Complete by: As directed       Signed: Dewayne Hatch, MD 08/31/2020, 1:25 PM   Pager: [phone]

## 2020-08-26 NOTE — Progress Notes (Signed)
Pt reminded to use urinal when needing to void. States he's been voiding in the bathroom. Urinal on bed rail within reach.  Lannie Heaps, RN

## 2020-08-26 NOTE — Discharge Instructions (Signed)
Food Basics for Chronic Kidney Disease °When your kidneys are not working well, they cannot remove waste and excess substances from your blood as effectively as they did before. This can lead to a buildup and imbalance of these substances, which can worsen kidney damage and affect how your body functions. Certain foods lead to a buildup of these substances in the body. By changing your diet as recommended by your diet and nutrition specialist (dietitian) or health care provider, you could help prevent further kidney damage and delay or prevent the need for dialysis. °What are tips for following this plan? °General instructions ° °· Work with your health care provider and dietitian to develop a meal plan that is right for you. Foods you can eat, limit, or avoid will be different for each person depending on the stage of kidney disease and any other existing health conditions. °· Talk with your health care provider about whether you should take a vitamin and mineral supplement. °· Use standard measuring cups and spoons to measure servings of foods. Use a kitchen scale to measure portions of protein foods. °· If directed by your health care provider, avoid drinking too much fluid. Measure and count all liquids, including water, ice, soups, flavored gelatin, and frozen desserts such as popsicles or ice cream. °Reading food labels °· Check the amount of sodium in foods. Choose foods that have less than 300 milligrams (mg) per serving. °· Check the ingredient list for phosphorus or potassium-based additives or preservatives. °· Check the amount of saturated and trans fat. Limit or avoid these fats as told by your dietitian. °Shopping °· Avoid buying foods that are: °? Processed, frozen, or prepackaged. °? Calcium-enriched or fortified. °· Do not buy foods that have salt or sodium listed among the first five ingredients. °· Do not buy canned vegetables. °Cooking °· Replace animal proteins, such as meat, fish, eggs, or  dairy, with plant proteins from beans, nuts, and soy. °? Use soy milk instead of cow's milk. °? Add beans or tofu to soups, casseroles, or pasta dishes instead of meat. °· Soak vegetables, such as potatoes, before cooking to reduce potassium. To do this: °? Peel and cut into small pieces. °? Soak in warm water for at least 2 hours. For every 1 cup of vegetables, use 10 cups of water. °? Drain and rinse with warm water. °? Boil for at least 5 minutes. °Meal planning °· Limit the amount of protein from plant and animal sources you eat each day. °· Do not add salt to food when cooking or before eating. °· Eat meals and snacks at around the same time each day. °If you have diabetes: °· If you have diabetes (diabetes mellitus) and chronic kidney disease, it is important to keep your blood glucose in the target range recommended by your health care provider. Follow your diabetes management plan. This may include: °? Checking your blood glucose regularly. °? Taking oral medicines, insulin, or both. °? Exercising for at least 30 minutes on 5 or more days each week, or as told by your health care provider. °? Tracking how many servings of carbohydrates you eat at each meal. °· You may be given specific guidelines on how much of certain foods and nutrients you may eat, depending on your stage of kidney disease and whether you have high blood pressure (hypertension). Follow your meal plan as told by your dietitian. °What nutrients should be limited? °The items listed are not a complete list. Talk with your dietitian   about what dietary choices are best for you. Potassium Potassium affects how steadily your heart beats. If too much potassium builds up in your blood, it can cause an irregular heartbeat or even a heart attack. You may need to eat less potassium, depending on your blood potassium levels and the stage of kidney disease. Talk to your dietitian about how much potassium you may have each day. You may need to limit  or avoid foods that are high in potassium, such as:  Milk and soy milk.  Fruits, such as bananas, papaya, apricots, nectarines, melon, prunes, raisins, kiwi, and oranges.  Vegetables, such as potatoes, sweet potatoes, yams, tomatoes, leafy greens, beets, okra, avocado, pumpkin, and winter squash.  White and lima beans. Phosphorus Phosphorus is a mineral found in your bones. A balance between calcium and phosphorous is needed to build and maintain healthy bones. Too much phosphorus pulls calcium from your bones. This can make your bones weak and more likely to break. Too much phosphorus can also make your skin itch. You may need to eat less phosphorus depending on your blood phosphorus levels and the stage of kidney disease. Talk to your dietitian about how much potassium you may have each day. You may need to take medicine to lower your blood phosphorus levels if diet changes do not help. You may need to limit or avoid foods that are high in phosphorus, such as:  Milk and dairy products.  Dried beans and peas.  Tofu, soy milk, and other soy-based meat replacements.  Colas.  Nuts and peanut butter.  Meat, poultry, and fish.  Bran cereals and oatmeals. Protein Protein helps you to make and keep muscle. It also helps in the repair of your bodys cells and tissues. One of the natural breakdown products of protein is a waste product called urea. When your kidneys are not working properly, they cannot remove wastes, such as urea, like they did before you developed chronic kidney disease. Reducing how much protein you eat can help prevent a buildup of urea in your blood. Depending on your stage of kidney disease, you may need to limit foods that are high in protein. Sources of animal protein include:  Meat (all types).  Fish and seafood.  Poultry.  Eggs.  Dairy. Other protein foods include:  Beans and legumes.  Nuts and nut butter.  Soy and tofu. Sodium Sodium, which is found  in salt, helps maintain a healthy balance of fluids in your body. Too much sodium can increase your blood pressure and have a negative effect on the function of your heart and lungs. Too much sodium can also cause your body to retain too much fluid, making your kidneys work harder. Most people should have less than 2,300 milligrams (mg) of sodium each day. If you have hypertension, you may need to limit your sodium to 1,500 mg each day. Talk to your dietitian about how much sodium you may have each day. You may need to limit or avoid foods that are high in sodium, such as:  Salt seasonings.  Soy sauce.  Cured and processed meats.  Salted crackers and snack foods.  Fast food.  Canned soups and most canned foods.  Pickled foods.  Vegetable juice.  Boxed mixes or ready-to-eat boxed meals and side dishes.  Bottled dressings, sauces, and marinades. Summary  Chronic kidney disease can lead to a buildup and imbalance of waste and excess substances in the body. Certain foods lead to a buildup of these substances. By adjusting  your intake of these foods, you could help prevent more kidney damage and delay or prevent the need for dialysis.  Food adjustments are different for each person with chronic kidney disease. Work with a dietitian to set up nutrient goals and a meal plan that is right for you.  If you have diabetes and chronic kidney disease, it is important to keep your blood glucose in the target range recommended by your health care provider. This information is not intended to replace advice given to you by your health care provider. Make sure you discuss any questions you have with your health care provider. Document Revised: 03/24/2019 Document Reviewed: 11/26/2016 Elsevier Patient Education  [place].   Hyperkalemia Hyperkalemia occurs when the level of potassium in your blood is too high. Potassium is an important nutrient that helps the muscles and nerves function  normally. It affects how the heart works, and it helps keep fluids and minerals balanced in the body. If there is too much potassium in your blood, it can affect your heart's ability to function normally. Potassium is normally removed (excreted) from the body by the kidneys. Hyperkalemia can result from various conditions. It can range from mild to severe. What are the causes? This condition may be caused by:  Taking in too much potassium. You can do this by: ? Using salt substitutes. They contain large amounts of potassium. ? Taking potassium supplements. ? Eating foods that are high in potassium.  Excreting too little potassium. This can happen if: ? Your kidneys are not working properly. Kidney (renal) disease, including short-term or long-term renal failure, is a common cause of hyperkalemia. ? You are taking medicines that lower your excretion of potassium. ? You have Addison's disease. ? You have a urinary tract blockage, such as kidney stones. ? You are on treatment to mechanically clean your blood (dialysis) and you skip a treatment.  Releasing a high amount of potassium from your cells into your blood. This can happen with: ? Injury to muscles (rhabdomyolysis) or other tissues. Most potassium is stored in your muscles. ? Severe burns or infections. ? Acidic blood plasma (acidosis). Acidosis can result from many diseases, such as uncontrolled diabetes. What increases the risk? The following factors may make you more likely to develop this condition:  Kidney disease. This puts you at the highest risk.  Addison's disease. This is a condition where the adrenal glands do not produce enough hormones.  Alcoholism or heavy drug use.  Using certain blood pressure medicines, such as ACE inhibitors, angiotensin II receptor blockers (ARBs), or potassium-sparing diuretics such as spironolactone.  Severe injury or burn. What are the signs or symptoms? In many cases, there are no  symptoms. However, when your potassium level becomes high enough, you may have symptoms such as:  An irregular or very slow heartbeat.  Nausea.  Tiredness (fatigue).  Confusion.  Tingling of your skin or numbness of your hands or feet.  Muscle cramps.  Muscle weakness.  Not being able to move (paralysis). How is this diagnosed? This condition may be diagnosed based on:  Your symptoms and medical history. Your health care provider will ask about your use of prescription and non-prescription drugs.  A physical exam.  Blood tests.  An electrocardiogram (ECG). How is this treated? Treatment depends on the cause and severity of your condition. Treatment may need to be done in the hospital setting. Treatment may include:  IV glucose (sugar) along with insulin to shift potassium out of your  blood and into your cells.  A medicine called albuterol to shift potassium out of your blood and into your cells.  Medicines to remove the potassium from your body.  Dialysis to remove the potassium from your body.  Calcium to protect your heart from the effects of high potassium, such as irregular rhythms (arrhythmias). Follow these instructions at home:   Take over-the-counter and prescription medicines only as told by your health care provider.  Do not take any supplements, natural products, herbs, or vitamins without reviewing them with your health care provider. Certain supplements and natural food products contain high amounts of potassium.  Limit your alcohol intake as told by your health care provider.  Do not use drugs. If you need help quitting, ask your health care provider.  If you have kidney disease, you may need to follow a low-potassium diet. A dietitian can help you learn which foods have high or low amounts of potassium.  Keep all follow-up visits as told by your health care provider. This is important. Contact a health care provider if you:  Have an irregular or  very slow heartbeat.  Feel light-headed.  Feel weak.  Are nauseous.  Have tingling or numbness in your hands or feet. Get help right away if you:  Have shortness of breath.  Have chest pain or discomfort.  Pass out.  Have muscle paralysis. Summary  Hyperkalemia occurs when the level of potassium in your blood is too high.  This condition may be caused by taking in too much potassium, excreting too little potassium, or releasing a high amount of potassium from your cells into your blood.  Hyperkalemia can result from many underlying conditions, especially chronic kidney disease, or from taking certain medicines.  Treatment of hyperkalemia may include medicine to shift potassium out of your blood and into your cells or to remove the potassium from your body.  If you have kidney disease, you may need to follow a low-potassium diet. A dietitian can help you learn which foods have high or low amounts of potassium. This information is not intended to replace advice given to you by your health care provider. Make sure you discuss any questions you have with your health care provider. Document Revised: 11/16/2017 Document Reviewed: 11/16/2017 Elsevier Patient Education  [place] on my medicine - ELIQUIS (apixaban)  This medication education was reviewed with me or my healthcare representative as part of my discharge preparation.  The pharmacist that spoke with me during my hospital stay was:  Onnie Boer, RPH-CPP  Why was Eliquis prescribed for you? Eliquis was prescribed for you to reduce the risk of a blood clot forming that can cause a stroke if you have a medical condition called atrial fibrillation (a type of irregular heartbeat).  What do You need to know about Eliquis ? Take your Eliquis TWICE DAILY - one tablet in the morning and one tablet in the evening with or without food. If you have difficulty swallowing the tablet whole please discuss with  your pharmacist how to take the medication safely.  Take Eliquis exactly as prescribed by your doctor and DO NOT stop taking Eliquis without talking to the doctor who prescribed the medication.  Stopping may increase your risk of developing a stroke.  Refill your prescription before you run out.  After discharge, you should have regular check-up appointments with your healthcare provider that is prescribing your Eliquis.  In the future your dose may need to be changed if your  kidney function or weight changes by a significant amount or as you get older.  What do you do if you miss a dose? If you miss a dose, take it as soon as you remember on the same day and resume taking twice daily.  Do not take more than one dose of ELIQUIS at the same time to make up a missed dose.  Important Safety Information A possible side effect of Eliquis is bleeding. You should call your healthcare provider right away if you experience any of the following: ? Bleeding from an injury or your nose that does not stop. ? Unusual colored urine (red or dark brown) or unusual colored stools (red or black). ? Unusual bruising for unknown reasons. ? A serious fall or if you hit your head (even if there is no bleeding).  Some medicines may interact with Eliquis and might increase your risk of bleeding or clotting while on Eliquis. To help avoid this, consult your healthcare provider or pharmacist prior to using any new prescription or non-prescription medications, including herbals, vitamins, non-steroidal anti-inflammatory drugs (NSAIDs) and supplements.  This website has more information on Eliquis (apixaban): http://www.eliquis.com/eliquis/home

## 2020-08-26 NOTE — Progress Notes (Signed)
Pt was just discharged home at 2000. IV was removed and discharge instructions were reviewed. RN answered any questions that pt had and RN told pt and niece (who picked pt up) that if pt has any problems obtaining medications from his pharmacy to call back up here and pt can get medications via the clinic, per MD instructions. Pt stated he was feeling much better and that he was thankful his itching had improved.   Eleanora Neighbor, RN

## 2020-08-27 NOTE — Progress Notes (Signed)
Internal Medicine Clinic Attending  Case discussed with Dr. [name]  At the time of the visit.  We reviewed the resident's history and exam and pertinent patient test results.  I agree with the assessment, diagnosis, and plan of care documented in the resident's note.  

## 2020-08-28 ENCOUNTER — Telehealth: Payer: Self-pay | Admitting: *Deleted

## 2020-08-28 ENCOUNTER — Telehealth: Payer: Medicare HMO

## 2020-08-28 NOTE — Telephone Encounter (Signed)
°  Chronic Care Management   Outreach Note  08/28/2020 Name: Derrick Hendrix. MRN: [id number] DOB: 1941-01-04  Referred by: [name], MD Reason for referral : Chronic Care Management (NIDDM, HTN, HLD, CKD, A fib, s/p CVA with left sided spasticity)   An unsuccessful telephone outreach was attempted today to complete hospital discharge transition of care call. . The patient was referred to the case management team for assistance with care management and care coordination.   Follow Up Plan: A HIPAA compliant phone message was left for the patient providing contact information and requesting a return call.  If patient does not return call, the care management team will reach out to the patient again over the next 3-5 days.   Kelli Churn RN, CCM, [facility] RN Care Manager [phone]

## 2020-08-29 ENCOUNTER — Ambulatory Visit (HOSPITAL_COMMUNITY): Payer: Medicare HMO

## 2020-08-29 ENCOUNTER — Telehealth: Payer: Medicare HMO

## 2020-08-29 ENCOUNTER — Telehealth: Payer: Self-pay | Admitting: *Deleted

## 2020-08-29 NOTE — Telephone Encounter (Signed)
  Chronic Care Management   Outreach Note  08/29/2020 Name: Derrick Hendrix. MRN: [id number] DOB: 08/07/1941  Referred by: [name], MD Reason for referral : Chronic Care Management (NIDDM, HTN, HLD, CKD, A fib, s/p CVA with left sided spasticity )   A second unsuccessful telephone outreach was attempted today to complete hospital discharge transition of care assessment. . The patient was referred to the case management team for assistance with care management and care coordination.   Follow Up Plan: A HIPAA compliant phone message was left for the patient providing contact information and requesting a return call.  The care management team will reach out to the patient again over the next 1-2 days.   Kelli Churn RN, CCM, [facility] RN Care Manager [phone]

## 2020-08-30 ENCOUNTER — Ambulatory Visit: Payer: Medicare HMO | Admitting: *Deleted

## 2020-08-30 DIAGNOSIS — I480 Paroxysmal atrial fibrillation: Secondary | ICD-10-CM

## 2020-08-30 DIAGNOSIS — I10 Essential (primary) hypertension: Secondary | ICD-10-CM

## 2020-08-30 DIAGNOSIS — N184 Chronic kidney disease, stage 4 (severe): Secondary | ICD-10-CM

## 2020-08-30 DIAGNOSIS — L409 Psoriasis, unspecified: Secondary | ICD-10-CM

## 2020-08-30 DIAGNOSIS — N183 Chronic kidney disease, stage 3 unspecified: Secondary | ICD-10-CM

## 2020-08-30 NOTE — Chronic Care Management (AMB) (Addendum)
  Chronic Care Management   Note  08/30/2020 Name: Derrick Hendrix. MRN: [id number] DOB: 05/04/1941   Successful outreach to patient to complete hospital discharge transition of care assessment.  Derrick Hendrix was hospitalized at [facility] from 9/9-9/12 for elevated potassium and acute on chronic renal failure. Reviewed esophagram rescheduled for 9/21 at 9:30 NPO 3 hrs prior to procedure.  He requests that this CCM RN call his niece Dollie Mayse to discuss upcoming appointments and Aranesp injection that was ordered at discharge to be given every week on Saturdays.  At patient's request attempted to contact his niece Marcos Ruelas at [phone]; left message for [name] that his esophagram has been rescheduled for 9/21 at 9:30 am and he is to have no food or drink for 3 hours prior to the test. Also,  offered any assistance with answering questions about the Aranesp injection that patient states [name] will administer on 9/18. Left name and direct contact number for this CCM RN on Tracey's voice mailbox and encouraged her to call with questions or concerns.   Follow up plan: The care management team will reach out to the patient again over the next 30-60 days.   Kelli Churn RN, CCM, [facility] RN Care Manager [phone]  Addendum: Patient's niece [name] left reply voice message for this CCM RN on 9/16 at 4:59 pm stating she is aware of patient's rescheduled appointment on 9/21 and that her sister will be taking patient to the appointment. She says she will be administering the Aranesp SQ injection to the patient every Saturday and that she has no questions. She also stated in her message that she wrote down the contact number for this CCM RN should she have questions or concerns in the future.  Kelli Churn RN, CCM, [facility] RN Care Manager [phone]

## 2020-09-03 ENCOUNTER — Other Ambulatory Visit (HOSPITAL_COMMUNITY): Payer: Medicare HMO

## 2020-09-03 DIAGNOSIS — N189 Chronic kidney disease, unspecified: Secondary | ICD-10-CM | POA: Diagnosis not present

## 2020-09-03 DIAGNOSIS — N184 Chronic kidney disease, stage 4 (severe): Secondary | ICD-10-CM | POA: Diagnosis not present

## 2020-09-03 DIAGNOSIS — I639 Cerebral infarction, unspecified: Secondary | ICD-10-CM | POA: Diagnosis not present

## 2020-09-03 DIAGNOSIS — N400 Enlarged prostate without lower urinary tract symptoms: Secondary | ICD-10-CM | POA: Diagnosis not present

## 2020-09-03 DIAGNOSIS — D631 Anemia in chronic kidney disease: Secondary | ICD-10-CM | POA: Diagnosis not present

## 2020-09-03 DIAGNOSIS — N2581 Secondary hyperparathyroidism of renal origin: Secondary | ICD-10-CM | POA: Diagnosis not present

## 2020-09-04 ENCOUNTER — Other Ambulatory Visit: Payer: Self-pay

## 2020-09-04 ENCOUNTER — Encounter (HOSPITAL_COMMUNITY): Payer: Self-pay

## 2020-09-04 ENCOUNTER — Telehealth: Payer: Self-pay | Admitting: *Deleted

## 2020-09-04 ENCOUNTER — Ambulatory Visit (HOSPITAL_COMMUNITY)
Admission: RE | Admit: 2020-09-04 | Discharge: 2020-09-04 | Disposition: A | Discharge status: Home / Self Care | Payer: Medicare HMO | Source: Ambulatory Visit | Attending: Internal Medicine | Admitting: Internal Medicine

## 2020-09-04 DIAGNOSIS — R1310 Dysphagia, unspecified: Secondary | ICD-10-CM

## 2020-09-04 NOTE — Telephone Encounter (Signed)
Patient is currently at [facility] for esophagus study ordered on 08/23/2020. However, patient had same study on 08/24/2020 while in-patient. Please advise if patient needs to repeat this study today. Hubbard Hartshorn, BSN, RN-BC

## 2020-09-04 NOTE — Telephone Encounter (Signed)
Derrick Hendrix has been notified that second esophagram is not needed and can be cancelled. Hubbard Hartshorn, BSN, RN-BC

## 2020-09-04 NOTE — Telephone Encounter (Signed)
Esophagram has been done already.  Cancel order.

## 2020-09-05 ENCOUNTER — Other Ambulatory Visit: Payer: Self-pay

## 2020-09-05 DIAGNOSIS — I739 Peripheral vascular disease, unspecified: Secondary | ICD-10-CM

## 2020-09-05 NOTE — Discharge Instructions (Signed)

## 2020-09-06 ENCOUNTER — Encounter (HOSPITAL_COMMUNITY)
Admission: RE | Admit: 2020-09-06 | Discharge: 2020-09-06 | Disposition: A | Discharge status: Home / Self Care | Payer: Medicare HMO | Source: Ambulatory Visit | Attending: Nephrology | Admitting: Nephrology

## 2020-09-06 ENCOUNTER — Other Ambulatory Visit: Payer: Self-pay

## 2020-09-06 VITALS — BP 162/102 | HR 87 | Temp 97.5°F | Resp 20

## 2020-09-06 DIAGNOSIS — N184 Chronic kidney disease, stage 4 (severe): Secondary | ICD-10-CM | POA: Insufficient documentation

## 2020-09-06 MED ORDER — EPOETIN ALFA 10000 UNIT/ML IJ SOLN
10000.0000 [IU] | INTRAMUSCULAR | Status: DC
  Administered 2020-09-06: 10000 [IU] via SUBCUTANEOUS

## 2020-09-06 MED ORDER — EPOETIN ALFA 10000 UNIT/ML IJ SOLN
INTRAMUSCULAR | Status: AC
  Filled 2020-09-06: qty 1

## 2020-09-07 ENCOUNTER — Other Ambulatory Visit: Payer: Self-pay | Admitting: Student

## 2020-09-07 ENCOUNTER — Encounter: Payer: Medicare HMO | Admitting: Student

## 2020-09-07 DIAGNOSIS — D631 Anemia in chronic kidney disease: Secondary | ICD-10-CM

## 2020-09-07 DIAGNOSIS — N183 Chronic kidney disease, stage 3 unspecified: Secondary | ICD-10-CM

## 2020-09-07 DIAGNOSIS — N184 Chronic kidney disease, stage 4 (severe): Secondary | ICD-10-CM

## 2020-09-07 DIAGNOSIS — E1121 Type 2 diabetes mellitus with diabetic nephropathy: Secondary | ICD-10-CM

## 2020-09-07 LAB — POCT HEMOGLOBIN-HEMACUE: Hemoglobin: 10.2 g/dL — ABNORMAL LOW (ref 13.0–17.0)

## 2020-09-07 NOTE — Addendum Note (Signed)
Addended by: Truddie Crumble on: 09/07/2020 09:44 AM   Modules accepted: Orders

## 2020-09-12 ENCOUNTER — Other Ambulatory Visit: Payer: Self-pay

## 2020-09-12 ENCOUNTER — Ambulatory Visit (INDEPENDENT_AMBULATORY_CARE_PROVIDER_SITE_OTHER): Payer: Medicare HMO | Admitting: Student

## 2020-09-12 ENCOUNTER — Encounter: Payer: Medicare HMO | Admitting: Internal Medicine

## 2020-09-12 ENCOUNTER — Encounter: Payer: Self-pay | Admitting: Student

## 2020-09-12 ENCOUNTER — Telehealth: Payer: Medicare HMO

## 2020-09-12 VITALS — BP 144/83 | HR 82 | Temp 97.7°F | Ht 74.0 in | Wt 211.5 lb

## 2020-09-12 DIAGNOSIS — L409 Psoriasis, unspecified: Secondary | ICD-10-CM

## 2020-09-12 DIAGNOSIS — N184 Chronic kidney disease, stage 4 (severe): Secondary | ICD-10-CM

## 2020-09-12 DIAGNOSIS — N183 Chronic kidney disease, stage 3 unspecified: Secondary | ICD-10-CM | POA: Diagnosis not present

## 2020-09-12 DIAGNOSIS — I10 Essential (primary) hypertension: Secondary | ICD-10-CM

## 2020-09-12 DIAGNOSIS — E1122 Type 2 diabetes mellitus with diabetic chronic kidney disease: Secondary | ICD-10-CM | POA: Diagnosis not present

## 2020-09-12 DIAGNOSIS — I129 Hypertensive chronic kidney disease with stage 1 through stage 4 chronic kidney disease, or unspecified chronic kidney disease: Secondary | ICD-10-CM | POA: Diagnosis not present

## 2020-09-12 DIAGNOSIS — I63532 Cerebral infarction due to unspecified occlusion or stenosis of left posterior cerebral artery: Secondary | ICD-10-CM

## 2020-09-12 DIAGNOSIS — K5900 Constipation, unspecified: Secondary | ICD-10-CM

## 2020-09-12 DIAGNOSIS — R1310 Dysphagia, unspecified: Secondary | ICD-10-CM | POA: Diagnosis not present

## 2020-09-12 DIAGNOSIS — R1319 Other dysphagia: Secondary | ICD-10-CM

## 2020-09-12 DIAGNOSIS — D631 Anemia in chronic kidney disease: Secondary | ICD-10-CM

## 2020-09-12 DIAGNOSIS — E875 Hyperkalemia: Secondary | ICD-10-CM

## 2020-09-12 LAB — COMPREHENSIVE METABOLIC PANEL
ALT: 15 U/L (ref 0–44)
AST: 17 U/L (ref 15–41)
Albumin: 3.6 g/dL (ref 3.5–5.0)
Alkaline Phosphatase: 83 U/L (ref 38–126)
Anion gap: 12 (ref 5–15)
BUN: 60 mg/dL — ABNORMAL HIGH (ref 8–23)
CO2: 20 mmol/L — ABNORMAL LOW (ref 22–32)
Calcium: 8.7 mg/dL — ABNORMAL LOW (ref 8.9–10.3)
Chloride: 110 mmol/L (ref 98–111)
Creatinine, Ser: 3.92 mg/dL — ABNORMAL HIGH (ref 0.61–1.24)
GFR calc Af Amer: 16 mL/min — ABNORMAL LOW (ref >60)
GFR calc non Af Amer: 13 mL/min — ABNORMAL LOW (ref >60)
Glucose, Bld: 77 mg/dL (ref 70–99)
Potassium: 5.3 mmol/L — ABNORMAL HIGH (ref 3.5–5.1)
Sodium: 142 mmol/L (ref 135–145)
Total Bilirubin: 0.9 mg/dL (ref 0.3–1.2)
Total Protein: 7.8 g/dL (ref 6.5–8.1)

## 2020-09-12 LAB — CBC
HCT: 38.4 % — ABNORMAL LOW (ref 39.0–52.0)
Hemoglobin: 11.2 g/dL — ABNORMAL LOW (ref 13.0–17.0)
MCH: 30.1 pg (ref 26.0–34.0)
MCHC: 29.2 g/dL — ABNORMAL LOW (ref 30.0–36.0)
MCV: 103.2 fL — ABNORMAL HIGH (ref 80.0–100.0)
Platelets: 366 10*3/uL (ref 150–400)
RBC: 3.72 MIL/uL — ABNORMAL LOW (ref 4.22–5.81)
RDW: 17.7 % — ABNORMAL HIGH (ref 11.5–15.5)
WBC: 7.4 10*3/uL (ref 4.0–10.5)
nRBC: 1.3 % — ABNORMAL HIGH (ref 0.0–0.2)

## 2020-09-12 LAB — LIPID PANEL
Cholesterol: 159 mg/dL (ref 0–200)
HDL: 60 mg/dL (ref >40)
LDL Cholesterol: 83 mg/dL (ref 0–99)
Total CHOL/HDL Ratio: 2.7 RATIO
Triglycerides: 79 mg/dL (ref <150)
VLDL: 16 mg/dL (ref 0–40)

## 2020-09-12 LAB — POCT GLYCOSYLATED HEMOGLOBIN (HGB A1C): Hemoglobin A1C: 4.8 % (ref 4.0–5.6)

## 2020-09-12 LAB — GLUCOSE, CAPILLARY: Glucose-Capillary: 74 mg/dL (ref 70–99)

## 2020-09-12 MED ORDER — DULCOLAX 5 MG PO TBEC: 5.0000 mg | DELAYED_RELEASE_TABLET | Freq: Every day | ORAL | 1 refills | Status: DC | PRN

## 2020-09-12 MED ORDER — TRIAMCINOLONE ACETONIDE 0.1 % EX OINT: 1.0000 "application " | TOPICAL_OINTMENT | Freq: Two times a day (BID) | CUTANEOUS | 0 refills | Status: DC

## 2020-09-12 MED ORDER — HYDRALAZINE HCL 10 MG PO TABS: 15.0000 mg | ORAL_TABLET | Freq: Three times a day (TID) | ORAL | 3 refills | Status: DC

## 2020-09-12 MED ORDER — CLOBETASOL PROPIONATE 0.05 % EX CREA: TOPICAL_CREAM | Freq: Two times a day (BID) | CUTANEOUS | 1 refills | Status: DC

## 2020-09-12 NOTE — Patient Instructions (Addendum)
Derrick Hendrix,   It was a pleasure seeing you today!  Today we discussed your recent hospitalization, rash, blood pressure, and recent results from your esophagus imaging.  Please make sure to follow-up with your kidney doctor. We will let you know the results of the blood work we did today.  We will re-fill the cobetasol and triamcinolone creams for your rash. We do recommend following up with dermatology, as they are the experts in skin diseases. We will refer you to a new dermatologist.  For your blood pressure, we are increasing your hydralazine. You will now take hydralazine 15mg  three times per day (stop taking hydralazine 10mg  three times per day).  We also discussed the times when you go on the bus and do not eat or drink beforehand. We want to make sure your blood sugars are not too low. We will prescribe some sugar tablets to you. If you get on the bus and have not eaten beforehand, take 1-2 of these tablets. If you get lightheaded, dizzy, sweating, or worsening shaking, take another tablet.   We are also referring you to a gastroenterologist so they can evaluate your esophagus to make sure it is working properly.  You can take the dulcolax tablets for constipation, once per day.  Your A1c, the marker we check for diabetes, is 4.8 today, which is right where we want it to be. Congratulations on all your hard work!  We look forward to seeing you next time. Please call our clinic at [phone] if you have any questions or concerns. The best time to call is Monday-Friday from 9am-4pm, but there is someone available 24/7 at the same number. If you need medication refills, please notify your pharmacy one week in advance and they will send [place] a request.  Thank you for letting us take part in your care. Wishing you the best!  Thank you, Dr. [name], MD

## 2020-09-13 ENCOUNTER — Encounter: Payer: Self-pay | Admitting: Student

## 2020-09-13 ENCOUNTER — Encounter (HOSPITAL_COMMUNITY)
Admission: RE | Admit: 2020-09-13 | Discharge: 2020-09-13 | Disposition: A | Discharge status: Home / Self Care | Payer: Medicare HMO | Source: Ambulatory Visit | Attending: Nephrology | Admitting: Nephrology

## 2020-09-13 VITALS — BP 163/85 | HR 90 | Temp 97.3°F

## 2020-09-13 DIAGNOSIS — N184 Chronic kidney disease, stage 4 (severe): Secondary | ICD-10-CM

## 2020-09-13 LAB — POCT HEMOGLOBIN-HEMACUE: Hemoglobin: 11.8 g/dL — ABNORMAL LOW (ref 13.0–17.0)

## 2020-09-13 MED ORDER — EPOETIN ALFA 10000 UNIT/ML IJ SOLN
10000.0000 [IU] | INTRAMUSCULAR | Status: DC
  Administered 2020-09-13: 10000 [IU] via SUBCUTANEOUS

## 2020-09-13 MED ORDER — EPOETIN ALFA 10000 UNIT/ML IJ SOLN
INTRAMUSCULAR | Status: AC
  Filled 2020-09-13: qty 1

## 2020-09-13 NOTE — Assessment & Plan Note (Signed)
A1c today 4.8. Patient says he sometimes feels lightheaded after being on the bus. Says he avoids drinking and eating prior to going on the bus, as his route takes a long time and he has no access to a bathroom. Denies LOC, falls.  A/P: -Discussed with patient symptoms likely due to hypoglycemia. Gave patient sugar tablets during clinic and discussed appropriate times to take them, including when he feels lightheaded, sweaty, and hasn't eaten in awhile. Discussed with patient he can take more than one if symptoms persist. Patient understands and agrees.

## 2020-09-13 NOTE — Progress Notes (Signed)
   CC: hospitalization follow-up  HPI:  [name]. is a 79 y.o. with medical history as shown below who presents for hospitalization follow-up appointment.  Please see problem-based list for further evaluation and detailed planning.  Past Medical History:  Diagnosis Date  . Abscess of foot without toes, left 08/28/2017  . Chronic kidney disease (CKD), stage III (moderate)   . Constipation 11/07/2014  . Daily headache    "lately" (04/17/2016)  . Depression   . Dizziness 03/07/2016  . Headache 06/26/2014  . Heart murmur   . Hyperkalemia 10/05/2018  . Hyperlipemia 11/11/2016  . Hypertension   . Neuropathic pain 02/06/2015  . Osteomyelitis ([place])   . PAD (peripheral artery disease) ([place])   . Paronychia of great toe, right   . Preop cardiovascular exam   . Right foot pain 02/09/2017  . Stable angina ([place]) 06/27/2014  . Stroke ([place]) 03/2016   hx of PAD; j"ust lots of headaches since" (04/17/2016)  . Vitamin B12 deficiency 06/27/2014  . Weight loss, unintentional 06/27/2014   Review of Systems:  As per HPI  Physical Exam:  Vitals:   09/12/20 1031  BP: (!) 144/83  Pulse: 82  Temp: 97.7 F (36.5 C)  TempSrc: Oral  SpO2: 99%  Weight: 211 lb 8 oz (95.9 kg)  Height: 6\' 2"  (1.88 m)   General: Pleasant, sitting in chair, no acute distress CV: Regular rate, rhythm. No murmurs, gallops, rubs. Pulm: Clear to auscultation bilaterally. Skin: Extensive psoriasis rash bilateral arms, trunk  Assessment & Plan:   See Encounters Tab for problem based charting.  Patient seen with Dr. Daryll Drown

## 2020-09-13 NOTE — Assessment & Plan Note (Signed)
Esophagram showed esophageal dysmotility disorder. Patient says he feels like he can eat pretty well, as long as he eats slow enough. Denies nausea, vomiting, weight changes.  A/P: -Referral to gastroenterology

## 2020-09-13 NOTE — Assessment & Plan Note (Signed)
Patient has biopsy-proven psoriasis, previously seen by dermatology. Says he currently takes clobetasol and triamcinolone creams, which improve his itching some. Says his right arm has improved since starting the current regimen. Mentions that he is hesitant about the dermatologist because "they are afraid to touch me." Discussed with patient dermatology is best place for treatment plan with his extensive disease.  A/P: -Will refer to new dermatologist, as patient likely would benefit from biologics -Continue triamcinolone, clobetasol creams -Avoiding benadryl, hydroxyzine due to urinary rentention side effects

## 2020-09-13 NOTE — Assessment & Plan Note (Signed)
BP Readings from Last 3 Encounters:  09/13/20 (!) 163/85  09/12/20 (!) 144/83  09/06/20 (!) 162/102   Patient states he is taking his medications as prescribed without chest pain, palpitations, headaches, lightheadedness.  A/P: -Increase hydralazine 15mg  three times per day -Continue amlodipine 10mg  once per day -Continue Lasix, Flomax

## 2020-09-13 NOTE — Assessment & Plan Note (Signed)
Patient recently admitted for hyperkalemia (K+7.2) due to decreased renal excretion, discharged with K+ 7.2. Patient cannot tolerate lokelma, but can take Kayexelate. Patient says he has been doing well since discharge, denies chest pain, palpitations, myaglias.  A/P: -Pending CBC, CMP

## 2020-09-14 ENCOUNTER — Telehealth: Payer: Self-pay

## 2020-09-14 DIAGNOSIS — E875 Hyperkalemia: Secondary | ICD-10-CM | POA: Diagnosis not present

## 2020-09-14 NOTE — Telephone Encounter (Signed)
[name] (niece) called to report patient no longer wants to schedule surgery. He doesn't not want to go on dialysis. Informed surgery scheduler and MD.

## 2020-09-17 ENCOUNTER — Ambulatory Visit: Payer: Medicare HMO | Admitting: *Deleted

## 2020-09-17 ENCOUNTER — Ambulatory Visit (HOSPITAL_COMMUNITY)
Admission: RE | Admit: 2020-09-17 | Discharge: 2020-09-17 | Disposition: A | Discharge status: Home / Self Care | Payer: Medicare HMO | Source: Ambulatory Visit | Attending: Surgery | Admitting: Surgery

## 2020-09-17 ENCOUNTER — Other Ambulatory Visit: Payer: Self-pay

## 2020-09-17 ENCOUNTER — Ambulatory Visit (INDEPENDENT_AMBULATORY_CARE_PROVIDER_SITE_OTHER)
Admission: RE | Admit: 2020-09-17 | Discharge: 2020-09-17 | Disposition: A | Discharge status: Home / Self Care | Payer: Medicare HMO | Source: Ambulatory Visit | Attending: Surgery | Admitting: Surgery

## 2020-09-17 ENCOUNTER — Ambulatory Visit (INDEPENDENT_AMBULATORY_CARE_PROVIDER_SITE_OTHER): Payer: Medicare HMO | Admitting: Surgery

## 2020-09-17 ENCOUNTER — Encounter: Payer: Self-pay | Admitting: Surgery

## 2020-09-17 VITALS — BP 165/85 | HR 87 | Temp 97.7°F | Resp 18 | Ht 74.0 in | Wt 205.0 lb

## 2020-09-17 DIAGNOSIS — E1122 Type 2 diabetes mellitus with diabetic chronic kidney disease: Secondary | ICD-10-CM

## 2020-09-17 DIAGNOSIS — N184 Chronic kidney disease, stage 4 (severe): Secondary | ICD-10-CM

## 2020-09-17 DIAGNOSIS — L409 Psoriasis, unspecified: Secondary | ICD-10-CM

## 2020-09-17 DIAGNOSIS — I739 Peripheral vascular disease, unspecified: Secondary | ICD-10-CM

## 2020-09-17 DIAGNOSIS — N183 Chronic kidney disease, stage 3 unspecified: Secondary | ICD-10-CM

## 2020-09-17 DIAGNOSIS — I10 Essential (primary) hypertension: Secondary | ICD-10-CM

## 2020-09-17 DIAGNOSIS — I63532 Cerebral infarction due to unspecified occlusion or stenosis of left posterior cerebral artery: Secondary | ICD-10-CM

## 2020-09-17 NOTE — Progress Notes (Signed)
[facility] [place]  Patient name: Derrick Hendrix. MRN: [id number] DOB: [birth year] Sex: male   REASON FOR VISIT:    Follow up  HISOTRY OF PRESENT ILLNESS:    Derrick Hendrix is a 79 y.o. male who initially presented to the emergency department in March 2018 with worsening right great toe pain.  He has a history of nailbed removal by Dr. Amalia Hailey for localized abscess.  The patient had diminished ABIs.  He did have symptoms of claudication and nonhealing wound therefore he was taken for angiography.  This revealed a right superficial femoral artery occlusion which was subsequently stented on 03/06/2017.  He has diffuse tibial disease.  He was able to heal all of his wounds.  He has not had any follow-up of this for quite some time.  He does not endorse claudication symptoms.  He does not have any wounds or rest pain.  I recently saw him for dialysis access.  We were planning for a right for stage basilic vein fistula, however his spasticity has gotten worse and he tells me that nephrology does not think he is a candidate for a fistula now.  The patient has a long history of smoking but has recently quit.  He had a stroke in 2017 without residual deficits.  H he takes a statin for hypercholesterolemia.  He takes Eliquis for AFIB.  He has significant muscle spasticity   PAST MEDICAL HISTORY:   Past Medical History:  Diagnosis Date  . Abscess of foot without toes, left 08/28/2017  . Chronic kidney disease (CKD), stage III (moderate)   . Constipation 11/07/2014  . Daily headache    "lately" (04/17/2016)  . Depression   . Dizziness 03/07/2016  . Headache 06/26/2014  . Heart murmur   . Hyperkalemia 10/05/2018  . Hyperlipemia 11/11/2016  . Hypertension   . Neuropathic pain 02/06/2015  . Osteomyelitis ([place])   . PAD (peripheral artery disease) ([place])   . Paronychia of great toe, right   . Preop cardiovascular exam   . Right foot pain  02/09/2017  . Stable angina ([place]) 06/27/2014  . Stroke ([place]) 03/2016   hx of PAD; j"ust lots of headaches since" (04/17/2016)  . Vitamin B12 deficiency 06/27/2014  . Weight loss, unintentional 06/27/2014     FAMILY HISTORY:   Family History  Problem Relation Age of Onset  . Hypertension Mother        died age 29  . Diabetes Mother     SOCIAL HISTORY:   Social History   Tobacco Use  . Smoking status: Former Smoker    Years: 66.00    Types: Cigarettes    Start date: 06/23/2017  . Smokeless tobacco: Never Used  Substance Use Topics  . Alcohol use: No    Alcohol/week: 0.0 standard drinks    Comment: 04/17/2016 "nothing in the last 3-4 years"     ALLERGIES:   Allergies  Allergen Reactions  . Aspirin Other (See Comments)    Nosebleeds      CURRENT MEDICATIONS:   Current Outpatient Medications  Medication Sig Dispense Refill  . amLODipine (NORVASC) 10 MG tablet TAKE ONE TABLET BY MOUTH DAILY (Patient taking differently: Take 10 mg by mouth daily. ) 90 tablet 1  . atorvastatin (LIPITOR) 80 MG tablet TAKE ONE TABLET BY MOUTH DAILY (Patient taking differently: Take 80 mg by mouth daily. ) 90 tablet 11  . bisacodyl (DULCOLAX) 5 MG EC tablet Take 1 tablet (5 mg total) by mouth daily  as needed for moderate constipation. 30 tablet 1  . Blood Pressure Monitoring (BLOOD PRESSURE KIT) DEVI Please check blood pressure twice a day. In the morning and evening. Please record numbers on a sheet of paper. Blood pressure parameters are 120-150/80-90 1 each 0  . calcitRIOL (ROCALTROL) 0.25 MCG capsule Take 0.25 mcg by mouth daily.    . camphor-menthol (SARNA) lotion Apply topically as needed for itching. 222 mL 0  . clobetasol cream (TEMOVATE) 0.05 % Apply topically 2 (two) times daily. 60 g 1  . clonazePAM (KLONOPIN) 0.25 MG disintegrating tablet TAKE ONE TABLET BY MOUTH THREE TIMES A DAY (Patient taking differently: Take 0.25 mg by mouth 2 (two) times daily. ) 90 tablet 0  . Darbepoetin Alfa  (ARANESP) 100 MCG/0.5ML SOSY injection Inject 0.5 mLs (100 mcg total) into the skin every Saturday at 6 PM. 4.2 mL 3  . docusate sodium (COLACE) 100 MG capsule Take 1 capsule (100 mg total) by mouth daily. 90 capsule 3  . ELIQUIS 2.5 MG TABS tablet TAKE ONE TABLET BY MOUTH TWICE A DAY (Patient taking differently: Take 2.5 mg by mouth 2 (two) times daily. ) 60 tablet 2  . finasteride (PROSCAR) 5 MG tablet TAKE ONE TABLET BY MOUTH DAILY (Patient taking differently: Take 5 mg by mouth daily. ) 90 tablet 11  . fludrocortisone (FLORINEF) 0.1 MG tablet TAKE ONE TABLET BY MOUTH TWICE A DAY 180 tablet 1  . furosemide (LASIX) 20 MG tablet TAKE ONE TABLET BY MOUTH DAILY (Patient taking differently: Take 20 mg by mouth daily. ) 30 tablet 2  . hydrALAZINE (APRESOLINE) 10 MG tablet Take 1.5 tablets (15 mg total) by mouth 3 (three) times daily. 135 tablet 3  . Multiple Vitamins-Minerals (DECUBI-VITE) CAPS Take 1 capsule by mouth daily. (Patient not taking: Reported on 08/23/2020)    . nitroGLYCERIN (NITROSTAT) 0.4 MG SL tablet Place 1 tablet (0.4 mg total) under the tongue every 5 (five) minutes x 3 doses as needed for chest pain. 30 tablet 3  . predniSONE (DELTASONE) 10 MG tablet Take 10 mg by mouth daily.     . sodium polystyrene (KAYEXALATE) 15 GM/60ML suspension Take 120 mLs (30 g total) by mouth 3 (three) times a week. 240 mL 0  . tamsulosin (FLOMAX) 0.4 MG CAPS capsule Take 2 capsules (0.8 mg total) by mouth daily. 90 capsule 0  . triamcinolone ointment (KENALOG) 0.1 % Apply 1 application topically 2 (two) times daily. 80 g 0   No current facility-administered medications for this visit.    REVIEW OF SYSTEMS:   '[X]'$  denotes positive finding, $RemoveBeforeDEI'[ ]'[id number]$  denotes negative finding Cardiac  Comments:  Chest pain or chest pressure:    Shortness of breath upon exertion:    Short of breath when lying flat:    Irregular heart rhythm:        Vascular    Pain in calf, thigh, or hip brought on by ambulation:    Pain  in feet at night that wakes you up from your sleep:     Blood clot in your veins:    Leg swelling:         Pulmonary    Oxygen at home:    Productive cough:     Wheezing:         Neurologic    Sudden weakness in arms or legs:     Sudden numbness in arms or legs:     Sudden onset of difficulty speaking or slurred speech:  Temporary loss of vision in one eye:     Problems with dizziness:         Gastrointestinal    Blood in stool:     Vomited blood:         Genitourinary    Burning when urinating:     Blood in urine:        Psychiatric    Major depression:         Hematologic    Bleeding problems:    Problems with blood clotting too easily:        Skin    Rashes or ulcers:        Constitutional    Fever or chills:      PHYSICAL EXAM:   There were no vitals filed for this visit.  GENERAL: The patient is a well-nourished male, in no acute distress. The vital signs are documented above. CARDIAC: There is a regular rate and rhythm.  VASCULAR: I cannot palpate pedal pulses PULMONARY: Non-labored respirations ABDOMEN: Soft and non-tender with normal pitched bowel sounds.  MUSCULOSKELETAL: There are no major deformities or cyanosis. NEUROLOGIC: No focal weakness or paresthesias are detected. SKIN: There are no ulcers or rashes noted. PSYCHIATRIC: The patient has a normal affect.  STUDIES:   I have reviewed the following: +-------+-------------+-----------+------------+------------+  ABI/TBIToday's ABI Today's TBIPrevious ABIPrevious TBI  +-------+-------------+-----------+------------+------------+  Right 0.77     -     0.68    0.42      +-------+-------------+-----------+------------+------------+  Left  Not performed-     0.91    0.48      +-------+-------------+-----------+------------+------------+  Right: Patent stent. Probable tibial artery occlusive disease.  MEDICAL ISSUES:   Current renal  insufficiency: The patient was scheduled for a for staged right basilic vein fistula creation, however this has been canceled because of worsening spasticity and concerns over the able to use his extremities.  PAD: Patient has a history of a right SFA stent which remains widely patent on ultrasound today.  He has severe tibial disease bilaterally.  He does not have any acute indications for intervention.  I will schedule him for follow-up in 1 year with repeat vascular lab studies.    Leia Alf, MD, FACS [facility] [phone] Pager [phone]

## 2020-09-17 NOTE — Chronic Care Management (AMB) (Signed)
  Chronic Care Management   Note  09/17/2020 Name: Rontrell Moquin. MRN: [id number] DOB: 1941/05/11  Patient's niece called, states patient received a letter from [facility] telling him that Aranesp is not on their preferred formulary but Procrit is. [name] states if Procrit is more than a once weekly injection then she will not be able to administer it to patient. She is asking if a home health nurse can go out to the home to administer the injection or can it be given in the clinic or a doctors' office. [name] says there is one more dose of Aranesp for next Saturday 10/9 before patient will need more of the medication.  [name] says the patient told her he does not want to receive dialysis and she is asking for an advance directive and health care power of attorney form so she can help the patient complete it. With Tracey's agreement, advance directive packet mailed to her home address and provided CCM BSW business card for any follow up questions or concerns.    Follow up plan: The care management team will reach out to the patient again over the next 30-60 days.   Kelli Churn RN, CCM, [facility] RN Care Manager [phone]

## 2020-09-17 NOTE — Progress Notes (Signed)
Internal Medicine Clinic Resident  I have personally reviewed this encounter including the documentation in this note and/or discussed this patient with the care management provider. I will address any urgent items identified by the care management provider and will communicate my actions to the patient's PCP. I have reviewed the patient's CCM visit with my supervising attending, Dr Mullen.  Aubriegh Minch, MD  [facility] PGY-2 09/17/2020    

## 2020-09-19 ENCOUNTER — Ambulatory Visit: Payer: Medicare HMO | Admitting: *Deleted

## 2020-09-19 ENCOUNTER — Other Ambulatory Visit: Payer: Self-pay

## 2020-09-19 DIAGNOSIS — L409 Psoriasis, unspecified: Secondary | ICD-10-CM

## 2020-09-19 DIAGNOSIS — N183 Chronic kidney disease, stage 3 unspecified: Secondary | ICD-10-CM

## 2020-09-19 DIAGNOSIS — E1122 Type 2 diabetes mellitus with diabetic chronic kidney disease: Secondary | ICD-10-CM

## 2020-09-19 DIAGNOSIS — N184 Chronic kidney disease, stage 4 (severe): Secondary | ICD-10-CM

## 2020-09-19 DIAGNOSIS — I63532 Cerebral infarction due to unspecified occlusion or stenosis of left posterior cerebral artery: Secondary | ICD-10-CM

## 2020-09-19 DIAGNOSIS — I10 Essential (primary) hypertension: Secondary | ICD-10-CM

## 2020-09-19 NOTE — Chronic Care Management (AMB) (Signed)
Chronic Care Management   Follow Up Note   09/19/2020 Name: Derrick Hendrix. MRN: [id number] DOB: March 27, 1941  Referred by: [name], MD Reason for referral : Chronic Care Management (NIDDM, HTN, HLD, CKD, A fib, s/p CVA with left sided spasticity )   Derrick Hendrix. is a 79 y.o. year old male who is a primary care patient of [name], [name], MD. The CCM team was consulted for assistance with chronic disease management and care coordination needs.    Review of patient status, including review of consultants reports, relevant laboratory and other test results, and collaboration with appropriate care team members and the patient's provider was performed as part of comprehensive patient evaluation and provision of chronic care management services.    SDOH (Social Determinants of Health) assessments performed: No See Care Plan activities for detailed interventions related to [facility])     Outpatient Encounter Medications as of 09/19/2020  Medication Sig  . amLODipine (NORVASC) 10 MG tablet TAKE ONE TABLET BY MOUTH DAILY (Patient taking differently: Take 10 mg by mouth daily. )  . atorvastatin (LIPITOR) 80 MG tablet TAKE ONE TABLET BY MOUTH DAILY (Patient taking differently: Take 80 mg by mouth daily. )  . bisacodyl (DULCOLAX) 5 MG EC tablet Take 1 tablet (5 mg total) by mouth daily as needed for moderate constipation.  . Blood Pressure Monitoring (BLOOD PRESSURE KIT) DEVI Please check blood pressure twice a day. In the morning and evening. Please record numbers on a sheet of paper. Blood pressure parameters are 120-150/80-90  . calcitRIOL (ROCALTROL) 0.25 MCG capsule Take 0.25 mcg by mouth daily.  . camphor-menthol (SARNA) lotion Apply topically as needed for itching.  . clobetasol cream (TEMOVATE) 0.05 % Apply topically 2 (two) times daily.  . clonazePAM (KLONOPIN) 0.25 MG disintegrating tablet TAKE ONE TABLET BY MOUTH THREE TIMES A DAY (Patient taking differently: Take 0.25 mg by mouth 2  (two) times daily. )  . Darbepoetin Alfa (ARANESP) 100 MCG/0.5ML SOSY injection Inject 0.5 mLs (100 mcg total) into the skin every Saturday at 6 PM.  . docusate sodium (COLACE) 100 MG capsule Take 1 capsule (100 mg total) by mouth daily.  [name] ELIQUIS 2.5 MG TABS tablet TAKE ONE TABLET BY MOUTH TWICE A DAY (Patient taking differently: Take 2.5 mg by mouth 2 (two) times daily. )  . finasteride (PROSCAR) 5 MG tablet TAKE ONE TABLET BY MOUTH DAILY (Patient taking differently: Take 5 mg by mouth daily. )  . fludrocortisone (FLORINEF) 0.1 MG tablet TAKE ONE TABLET BY MOUTH TWICE A DAY  . furosemide (LASIX) 20 MG tablet TAKE ONE TABLET BY MOUTH DAILY (Patient taking differently: Take 20 mg by mouth daily. )  . hydrALAZINE (APRESOLINE) 10 MG tablet Take 1.5 tablets (15 mg total) by mouth 3 (three) times daily.  . Multiple Vitamins-Minerals (DECUBI-VITE) CAPS Take 1 capsule by mouth daily. (Patient not taking: Reported on 08/23/2020)  . nitroGLYCERIN (NITROSTAT) 0.4 MG SL tablet Place 1 tablet (0.4 mg total) under the tongue every 5 (five) minutes x 3 doses as needed for chest pain.  . predniSONE (DELTASONE) 10 MG tablet Take 10 mg by mouth daily.   . sodium polystyrene (KAYEXALATE) 15 GM/60ML suspension Take 120 mLs (30 g total) by mouth 3 (three) times a week.  . tamsulosin (FLOMAX) 0.4 MG CAPS capsule Take 2 capsules (0.8 mg total) by mouth daily.  [name] triamcinolone ointment (KENALOG) 0.1 % Apply 1 application topically 2 (two) times daily.   No facility-administered encounter medications  on file as of 09/19/2020.     Objective:  BP Readings from Last 3 Encounters:  09/17/20 (!) 165/85  09/13/20 (!) 163/85  09/12/20 (!) 144/83   Wt Readings from Last 3 Encounters:  09/17/20 205 lb (93 kg)  09/12/20 211 lb 8 oz (95.9 kg)  08/25/20 209 lb 7 oz (95 kg)   Lab Results  Component Value Date   CHOL 159 09/12/2020   HDL 60 09/12/2020   LDLCALC 83 09/12/2020   TRIG 79 09/12/2020   CHOLHDL 2.7 09/12/2020     Goals Addressed              This Visit's Progress     Patient Stated   .  "I'll get up with my niece and fill out those forms you are talking about ( advance directive and health care power of attorney) " (pt-stated)        [place] (see longitudinal plan of care for additional care plan information)  Current Barriers:  . Limited education about the importance of naming a healthcare power of attorney- spoke with patient and his niece and patient would like assistance with completing advance directives, patient said he was told that he is not a candidate for dialysis because of his left sided spasticity, he said his Mom and another relative were on dialysis and he is aware of the hardships the treatment causes  Clinical Social Work Clinical Goal(s):  [name] Over the next 30 days, patient will verbalize basic understanding of Advanced Directives and importance of completion . Over the next 30 days, the patient will complete mailed Advance Directive packet with the assistance of CCM Team . Over the next 45- 90 days, the patient will have Advance Directive notarized and provide a copy to his provider   Interventions: . Interviewed patient about Financial controller and provided education about the importance of completing advanced directives . Patient interviewed and appropriate assessments performed . Mailed the patient's niece an EMMI educational handout on Advance Directives as well as an Emergency planning/management officer . Advised patient to review information mailed by this SW . Provided education and assistance to patient regarding Advanced Directives. . A voluntary discussion about advanced care planning including explanation and discussion of advanced directives was extensively discussed with the patient and niece of patient.  Explanation regarding healthcare proxy and living will was reviewed and packet with forms with expiration of how to fill them out was given.    Patient Self  Care Activities:  . Can read and write at 6th grading reading level . Is able to complete documentation independently . Is able to readily make contact with social support system . Can identify next of kin, power or attorney, guardian, or primary caregiver . Attends all scheduled provider appointments . Calls provider office for new concerns or questions . Performs ADLs independently  Initial goal documentation      .  COMPLETED: "I'm having difficulty swallowing at times and the jerking on my left side seems worse and my left ankle and foot seem weaker.' (pt-stated)        CARE PLAN ENTRY (see longitudinal plan of care for additional care plan information)  Current Barriers:  . Care Coordination needs related to new numerological symptoms in a patient with HTN, HLD, NIDDM, A Fib and hx of prior stroke. - spoke with patient via phone, he says he is getting strangled easily especially when he drinks water, and he says the jerking on his left side  seems worse and he feels as though his left side is weaker, he describes these symptoms as intermittent that he first noticed several weeks ago, when it was suggested he make a clinic appointment he stated he has difficulty getting through to the front desk at the clinic and requested assistance from this CCM RN  Nurse Case Manager Clinical Goal(s):  [name] Over the next 7-10 days, patient will attend clinic appointments to have his neurological symptoms assessed. On 08/23/20 patient completed follow up appointment to have his concerns addressed  Interventions:  . Inter-disciplinary care team collaboration (see longitudinal plan of care) . Discussed concerns that he may be aspirating which could put him at risk for pneumonia and advised him he needs to be evaluated at the clinic  . Advised patient this CCM RN will message clinic front desk and request assistance in scheduling appointment for patient  Patient Self Care Activities:  . Patient verbalizes  understanding of plan to schedule and keep clinic appointment  . Does not contact provider office for questions/concerns  Please see past updates related to this goal by clicking on the "Past Updates" button in the selected goal      .  "I'm interested in getting the Covid vaccine." (pt-stated)        CARE PLAN ENTRY (see longitudinal plan of care for additional care plan information)  Current Barriers:  . Care Coordination needs related to securing Covid vaccine appointment in a patient with NIDDM, HTN, HLD, CKD, A fib, s/p CVA with left sided spasticity - patient declined offer to have this CCM RN arrange for the vaccination at his home, he says he wants   Nurse Case Manager Clinical Goal(s):  [name] Over the next 30 days, patient will verbalize understanding of plan for CCM RN to work with him and his niece to arrange for 1st Covid vaccination  Interventions:  . Inter-disciplinary care team collaboration (see longitudinal plan of care) . Advised patient with his permission this CCM RN will contact niece [name] and give her the number for the Patient [facility]) so she can schedule the Covid vaccine appointment since she will be providing transportation . Called patient's niece [name] and gave her the phone number for the [facility] and she agrees to schedule the vaccine appointment  Patient Self Care Activities:  . Patient verbalizes understanding of plan to work with CCM RN and niece to get 1st Covid vaccine . Self administers medications as prescribed . Attends all scheduled provider appointments . Calls pharmacy for medication refills . Performs ADL's independently . Calls provider office for new concerns or questions . Unable to independently schedule 1st Covid vaccination appointment  Initial goal documentation         Plan:   The care management team will reach out to the patient again over the next 30-60 days.    Kelli Churn RN, CCM, [facility] RN Care  Manager [phone]

## 2020-09-19 NOTE — Progress Notes (Addendum)
Internal Medicine Clinic Resident  I have personally reviewed this encounter including the documentation in this note and/or discussed this patient with the care management provider. I will address any urgent items identified by the care management provider and will communicate my actions to the patient's PCP. I have reviewed the patient's CCM visit with my supervising attending, Dr Jimmye Norman.  Harvie Heck, MD  [facility] PGY-2 09/19/2020     Internal Medicine Attending: I have reviewed the above documentation and agree with the CCM visit findings and plans. Dr. Dorian Pod, MD

## 2020-09-19 NOTE — Patient Instructions (Signed)
Visit Information I gave Derrick Hendrix the phone number so she can call and schedule your Covid vaccination appointment.  Goals Addressed              This Visit's Progress     Patient Stated   .  "I'll get up with my niece and fill out those forms you are talking about ( advance directive and health care power of attorney) " (pt-stated)        Derrick Hendrix (see longitudinal plan of care for additional care plan information)  Current Barriers:  . Limited education about the importance of naming a healthcare power of attorney- spoke with patient and his niece and patient would like assistance with completing advance directives, patient said he was told that he is not a candidate for dialysis because of his left sided spasticity, he said his Mom and another relative were on dialysis and he is aware of the hardships the treatment causes  Clinical Social Work Clinical Goal(s):  [name] Over the next 30 days, patient will verbalize basic understanding of Advanced Directives and importance of completion . Over the next 30 days, the patient will complete mailed Advance Directive packet with the assistance of CCM Team . Over the next 45- 90 days, the patient will have Advance Directive notarized and provide a copy to his provider   Interventions: . Interviewed patient about Financial controller and provided education about the importance of completing advanced directives . Patient interviewed and appropriate assessments performed . Mailed the patient's niece an EMMI educational handout on Advance Directives as well as an Emergency planning/management officer . Advised patient to review information mailed by this SW . Provided education and assistance to patient regarding Advanced Directives. . A voluntary discussion about advanced care planning including explanation and discussion of advanced directives was extensively discussed with the patient and niece of patient.  Explanation regarding healthcare proxy and living  will was reviewed and packet with forms with expiration of how to fill them out was given.    Patient Self Care Activities:  . Can read and write at 6th grading reading level . Is able to complete documentation independently . Is able to readily make contact with social support system . Can identify next of kin, power or attorney, guardian, or primary caregiver . Attends all scheduled provider appointments . Calls provider office for new concerns or questions . Performs ADLs independently  Initial goal documentation      .  COMPLETED: "I'm having difficulty swallowing at times and the jerking on my left side seems worse and my left ankle and foot seem weaker.' (pt-stated)        CARE PLAN ENTRY (see longitudinal plan of care for additional care plan information)  Current Barriers:  . Care Coordination needs related to new numerological symptoms in a patient with HTN, HLD, NIDDM, A Fib and hx of prior stroke. - spoke with patient via phone, he says he is getting strangled easily especially when he drinks water, and he says the jerking on his left side seems worse and he feels as though his left side is weaker, he describes these symptoms as intermittent that he first noticed several weeks ago, when it was suggested he make a clinic appointment he stated he has difficulty getting through to the front desk at the clinic and requested assistance from this CCM RN  Nurse Case Manager Clinical Goal(s):  [name] Over the next 7-10 days, patient will attend clinic appointments to have his neurological  symptoms assessed. On 08/23/20 patient completed follow up appointment to have his concerns addressed  Interventions:  . Inter-disciplinary care team collaboration (see longitudinal plan of care) . Discussed concerns that he may be aspirating which could put him at risk for pneumonia and advised him he needs to be evaluated at the clinic  . Advised patient this CCM RN will message clinic front desk and  request assistance in scheduling appointment for patient  Patient Self Care Activities:  . Patient verbalizes understanding of plan to schedule and keep clinic appointment  . Does not contact provider office for questions/concerns  Please see past updates related to this goal by clicking on the "Past Updates" button in the selected goal      .  "I'm interested in getting the Covid vaccine." (pt-stated)        CARE PLAN ENTRY (see longitudinal plan of care for additional care plan information)  Current Barriers:  . Care Coordination needs related to securing Covid vaccine appointment in a patient with NIDDM, HTN, HLD, CKD, A fib, s/p CVA with left sided spasticity - patient declined offer to have this CCM RN arrange for the vaccination at his home, he says he wants   Nurse Case Manager Clinical Goal(s):  [name] Over the next 30 days, patient will verbalize understanding of plan for CCM RN to work with him and his niece to arrange for 1st Covid vaccination  Interventions:  . Inter-disciplinary care team collaboration (see longitudinal plan of care) . Advised patient with his permission this CCM RN will contact niece Derrick Hendrix and give her the number for the Patient [facility]) so she can schedule the Covid vaccine appointment since she will be providing transportation . Called patient's niece Derrick Hendrix and gave her the phone number for the [facility] and she agrees to schedule the vaccine appointment  Patient Self Care Activities:  . Patient verbalizes understanding of plan to work with CCM RN and niece to get 1st Covid vaccine . Self administers medications as prescribed . Attends all scheduled provider appointments . Calls pharmacy for medication refills . Performs ADL's independently . Calls provider office for new concerns or questions . Unable to independently schedule 1st Covid vaccination appointment  Initial goal documentation        The patient verbalized understanding of  instructions provided today and declined a print copy of patient instruction materials.   The care management team will reach out to the patient again over the next 30-60 days.   Kelli Churn RN, CCM, [facility] RN Care Manager [phone]

## 2020-09-20 ENCOUNTER — Other Ambulatory Visit: Payer: Self-pay | Admitting: Internal Medicine

## 2020-09-20 ENCOUNTER — Encounter: Payer: Self-pay | Admitting: *Deleted

## 2020-09-20 ENCOUNTER — Other Ambulatory Visit: Payer: Self-pay

## 2020-09-20 ENCOUNTER — Encounter (HOSPITAL_COMMUNITY)
Admission: RE | Admit: 2020-09-20 | Discharge: 2020-09-20 | Disposition: A | Discharge status: Home / Self Care | Payer: Medicare HMO | Source: Ambulatory Visit | Attending: Nephrology | Admitting: Nephrology

## 2020-09-20 ENCOUNTER — Other Ambulatory Visit (HOSPITAL_COMMUNITY): Payer: Self-pay | Admitting: Nurse Practitioner

## 2020-09-20 VITALS — BP 142/99 | HR 81 | Temp 97.5°F | Resp 18

## 2020-09-20 DIAGNOSIS — R339 Retention of urine, unspecified: Secondary | ICD-10-CM

## 2020-09-20 DIAGNOSIS — N184 Chronic kidney disease, stage 4 (severe): Secondary | ICD-10-CM | POA: Diagnosis not present

## 2020-09-20 LAB — POCT HEMOGLOBIN-HEMACUE: Hemoglobin: 11.9 g/dL — ABNORMAL LOW (ref 13.0–17.0)

## 2020-09-20 MED ORDER — EPOETIN ALFA 10000 UNIT/ML IJ SOLN
10000.0000 [IU] | INTRAMUSCULAR | Status: DC
  Administered 2020-09-20: 10000 [IU] via SUBCUTANEOUS

## 2020-09-20 MED ORDER — EPOETIN ALFA 10000 UNIT/ML IJ SOLN
INTRAMUSCULAR | Status: AC
  Filled 2020-09-20: qty 1

## 2020-09-20 NOTE — Progress Notes (Signed)
Internal Medicine Clinic Attending ° °CCM services provided by the care management provider and their documentation were discussed with Dr. Aslam. We reviewed the pertinent findings, urgent action items addressed by the resident and non-urgent items to be addressed by the PCP.  I agree with the assessment, diagnosis, and plan of care documented in the CCM and resident's note. ° °Ranell Finelli, MD °09/20/2020 ° °

## 2020-09-20 NOTE — Progress Notes (Signed)
Internal Medicine Clinic Attending  I saw and evaluated the patient.  I personally confirmed the key portions of the history and exam documented by Dr. Braswell and I reviewed pertinent patient test results.  The assessment, diagnosis, and plan were formulated together and I agree with the documentation in the resident's note.  

## 2020-09-21 ENCOUNTER — Ambulatory Visit: Payer: Medicare HMO

## 2020-09-24 DIAGNOSIS — E875 Hyperkalemia: Secondary | ICD-10-CM | POA: Diagnosis not present

## 2020-09-25 ENCOUNTER — Encounter: Payer: Self-pay | Admitting: *Deleted

## 2020-09-27 ENCOUNTER — Encounter: Payer: Self-pay | Admitting: *Deleted

## 2020-09-27 ENCOUNTER — Encounter (HOSPITAL_COMMUNITY)
Admission: RE | Admit: 2020-09-27 | Discharge: 2020-09-27 | Disposition: A | Discharge status: Home / Self Care | Payer: Medicare HMO | Source: Ambulatory Visit | Attending: Nephrology | Admitting: Nephrology

## 2020-09-27 ENCOUNTER — Other Ambulatory Visit: Payer: Self-pay

## 2020-09-27 ENCOUNTER — Ambulatory Visit: Payer: Medicare HMO | Admitting: *Deleted

## 2020-09-27 VITALS — BP 145/81 | HR 86 | Temp 97.7°F | Resp 18

## 2020-09-27 DIAGNOSIS — E1122 Type 2 diabetes mellitus with diabetic chronic kidney disease: Secondary | ICD-10-CM

## 2020-09-27 DIAGNOSIS — N184 Chronic kidney disease, stage 4 (severe): Secondary | ICD-10-CM

## 2020-09-27 DIAGNOSIS — N183 Chronic kidney disease, stage 3 unspecified: Secondary | ICD-10-CM

## 2020-09-27 DIAGNOSIS — I63532 Cerebral infarction due to unspecified occlusion or stenosis of left posterior cerebral artery: Secondary | ICD-10-CM

## 2020-09-27 DIAGNOSIS — I10 Essential (primary) hypertension: Secondary | ICD-10-CM

## 2020-09-27 LAB — POCT HEMOGLOBIN-HEMACUE: Hemoglobin: 11.6 g/dL — ABNORMAL LOW (ref 13.0–17.0)

## 2020-09-27 MED ORDER — EPOETIN ALFA 10000 UNIT/ML IJ SOLN
INTRAMUSCULAR | Status: AC
  Administered 2020-09-27: 10000 [IU] via SUBCUTANEOUS
  Filled 2020-09-27: qty 1

## 2020-09-27 MED ORDER — EPOETIN ALFA 10000 UNIT/ML IJ SOLN: 10000.0000 [IU] | INTRAMUSCULAR | Status: DC

## 2020-09-27 NOTE — Chronic Care Management (AMB) (Signed)
Chronic Care Management   Follow Up Note   09/27/2020 Name: Derrick Hendrix. MRN: [id number] DOB: [birth year]  Referred by: [name], MD Reason for referral : Chronic Care Management (NIDDM, HTN, HLD, CKD, A fib, s/p CVA with left sided spasticity )   Derrick Hendrix. is a 79 y.o. year old male who is a primary care patient of [name], [name], MD. The CCM team was consulted for assistance with chronic disease management and care coordination needs.    Review of patient status, including review of consultants reports, relevant laboratory and other test results, and collaboration with appropriate care team members and the patient's provider was performed as part of comprehensive patient evaluation and provision of chronic care management services.    SDOH (Social Determinants of Health) assessments performed: No See Care Plan activities for detailed interventions related to [facility])     Outpatient Encounter Medications as of 09/27/2020  Medication Sig  . amLODipine (NORVASC) 10 MG tablet TAKE ONE TABLET BY MOUTH DAILY (Patient taking differently: Take 10 mg by mouth daily. )  . apixaban (ELIQUIS) 2.5 MG TABS tablet Take 1 tablet (2.5 mg total) by mouth 2 (two) times daily.  [name] atorvastatin (LIPITOR) 80 MG tablet TAKE ONE TABLET BY MOUTH DAILY (Patient taking differently: Take 80 mg by mouth daily. )  . bisacodyl (DULCOLAX) 5 MG EC tablet Take 1 tablet (5 mg total) by mouth daily as needed for moderate constipation.  . Blood Pressure Monitoring (BLOOD PRESSURE KIT) DEVI Please check blood pressure twice a day. In the morning and evening. Please record numbers on a sheet of paper. Blood pressure parameters are 120-150/80-90  . calcitRIOL (ROCALTROL) 0.25 MCG capsule Take 0.25 mcg by mouth daily.  . camphor-menthol (SARNA) lotion Apply topically as needed for itching.  . clobetasol cream (TEMOVATE) 0.05 % Apply topically 2 (two) times daily.  . clonazePAM (KLONOPIN) 0.25 MG disintegrating  tablet TAKE ONE TABLET BY MOUTH THREE TIMES A DAY (Patient taking differently: Take 0.25 mg by mouth 2 (two) times daily. )  . Darbepoetin Alfa (ARANESP) 100 MCG/0.5ML SOSY injection Inject 0.5 mLs (100 mcg total) into the skin every Saturday at 6 PM.  . docusate sodium (COLACE) 100 MG capsule Take 1 capsule (100 mg total) by mouth daily.  . finasteride (PROSCAR) 5 MG tablet TAKE ONE TABLET BY MOUTH DAILY (Patient taking differently: Take 5 mg by mouth daily. )  . fludrocortisone (FLORINEF) 0.1 MG tablet TAKE ONE TABLET BY MOUTH TWICE A DAY  . furosemide (LASIX) 20 MG tablet TAKE ONE TABLET BY MOUTH DAILY (Patient taking differently: Take 20 mg by mouth daily. )  . hydrALAZINE (APRESOLINE) 10 MG tablet Take 1.5 tablets (15 mg total) by mouth 3 (three) times daily.  . Multiple Vitamins-Minerals (DECUBI-VITE) CAPS Take 1 capsule by mouth daily. (Patient not taking: Reported on 08/23/2020)  . nitroGLYCERIN (NITROSTAT) 0.4 MG SL tablet Place 1 tablet (0.4 mg total) under the tongue every 5 (five) minutes x 3 doses as needed for chest pain.  . predniSONE (DELTASONE) 10 MG tablet Take 10 mg by mouth daily.   . sodium polystyrene (KAYEXALATE) 15 GM/60ML suspension Take 120 mLs (30 g total) by mouth 3 (three) times a week.  . tamsulosin (FLOMAX) 0.4 MG CAPS capsule TAKE TWO CAPSULES BY MOUTH DAILY  . triamcinolone ointment (KENALOG) 0.1 % Apply 1 application topically 2 (two) times daily.   Facility-Administered Encounter Medications as of 09/27/2020  Medication  . epoetin alfa (EPOGEN) injection 10,000  Units     Objective:  Wt Readings from Last 3 Encounters:  09/17/20 205 lb (93 kg)  09/12/20 211 lb 8 oz (95.9 kg)  08/25/20 209 lb 7 oz (95 kg)   BP Readings from Last 3 Encounters:  09/27/20 (!) 145/81  09/20/20 (!) 142/99  09/17/20 (!) 165/85   Lab Results  Component Value Date   HGBA1C 4.8 09/12/2020   HGBA1C 5.5 02/20/2020   HGBA1C 6.0 (A) 11/16/2019   Lab Results  Component Value Date    LDLCALC 83 09/12/2020   CREATININE 3.92 (H) 09/12/2020    Goals Addressed              This Visit's Progress     Patient Stated   .  "I'll get up with my niece and fill out those forms you are talking about ( advance directive and health care power of attorney) " (pt-stated)        [place] (see longitudinal plan of care for additional care plan information)  Current Barriers:  . Limited education about the importance of naming a healthcare power of attorney- spoke with patient's niece [name] in response to voice message left for this CCM RN earlier today, she states she received the advance directive packet despite having the wrong house number  Clinical Social Work Clinical Goal(s):  [name] Over the next 30 days, patient will verbalize basic understanding of Advanced Directives and importance of completion . Over the next 30 days, the patient will complete mailed Advance Directive packet with the assistance of CCM Team . Over the next 45- 90 days, the patient will have Advance Directive notarized and provide a copy to his provider   Interventions: . 09/27/20 changed niece's address in patient's contacts in his electronic medical record to reflect correct house number . 09/27/20- advised niece this CCM RN will mail another EMMI educational handout on Advance Directives to the correct address today.   . 09/27/20 Encouraged niece and/or patient to contact CCM BSW for questions related to completing advance directives   Patient Self Care Activities:  . Can read and write at 6th grading reading level . Is able to complete documentation independently . Is able to readily make contact with social support system . Can identify next of kin, power or attorney, guardian, or primary caregiver . Attends all scheduled provider appointments . Calls provider office for new concerns or questions . Performs ADLs independently  Please see past updates related to this goal by clicking on  the "Past Updates" button in the selected goal           Plan:   The care management team will reach out to the patient again over the next 30-60 days.    Kelli Churn RN, CCM, [facility] RN Care Manager [phone]

## 2020-09-28 NOTE — Progress Notes (Signed)
Internal Medicine Clinic Resident  I have personally reviewed this encounter including the documentation in this note and/or discussed this patient with the care management provider. I will address any urgent items identified by the care management provider and will communicate my actions to the patient's PCP. I have reviewed the patient's CCM visit with my supervising attending, Dr Rebeca Alert.  Iona Beard, MD 09/28/2020

## 2020-10-02 ENCOUNTER — Other Ambulatory Visit: Payer: Self-pay | Admitting: Internal Medicine

## 2020-10-03 ENCOUNTER — Other Ambulatory Visit (HOSPITAL_COMMUNITY): Payer: Self-pay | Admitting: *Deleted

## 2020-10-03 DIAGNOSIS — L400 Psoriasis vulgaris: Secondary | ICD-10-CM | POA: Diagnosis not present

## 2020-10-04 ENCOUNTER — Encounter (HOSPITAL_COMMUNITY)
Admission: RE | Admit: 2020-10-04 | Discharge: 2020-10-04 | Disposition: A | Discharge status: Home / Self Care | Payer: Medicare HMO | Source: Ambulatory Visit | Attending: Nephrology | Admitting: Nephrology

## 2020-10-04 ENCOUNTER — Other Ambulatory Visit: Payer: Self-pay

## 2020-10-04 VITALS — BP 148/82 | HR 97 | Temp 97.0°F | Resp 18

## 2020-10-04 DIAGNOSIS — N184 Chronic kidney disease, stage 4 (severe): Secondary | ICD-10-CM | POA: Diagnosis not present

## 2020-10-04 LAB — IRON AND TIBC
Iron: 63 ug/dL (ref 45–182)
Saturation Ratios: 18 % (ref 17.9–39.5)
TIBC: 357 ug/dL (ref 250–450)
UIBC: 294 ug/dL

## 2020-10-04 LAB — POCT HEMOGLOBIN-HEMACUE: Hemoglobin: 14.5 g/dL (ref 13.0–17.0)

## 2020-10-04 LAB — FERRITIN: Ferritin: 43 ng/mL (ref 24–336)

## 2020-10-04 MED ORDER — EPOETIN ALFA 10000 UNIT/ML IJ SOLN: 10000.0000 [IU] | INTRAMUSCULAR | Status: DC

## 2020-10-09 DIAGNOSIS — D631 Anemia in chronic kidney disease: Secondary | ICD-10-CM | POA: Diagnosis not present

## 2020-10-09 DIAGNOSIS — I639 Cerebral infarction, unspecified: Secondary | ICD-10-CM | POA: Diagnosis not present

## 2020-10-09 DIAGNOSIS — N2581 Secondary hyperparathyroidism of renal origin: Secondary | ICD-10-CM | POA: Diagnosis not present

## 2020-10-09 DIAGNOSIS — N184 Chronic kidney disease, stage 4 (severe): Secondary | ICD-10-CM | POA: Diagnosis not present

## 2020-10-09 DIAGNOSIS — N189 Chronic kidney disease, unspecified: Secondary | ICD-10-CM | POA: Diagnosis not present

## 2020-10-09 DIAGNOSIS — N400 Enlarged prostate without lower urinary tract symptoms: Secondary | ICD-10-CM | POA: Diagnosis not present

## 2020-10-10 ENCOUNTER — Other Ambulatory Visit (HOSPITAL_COMMUNITY): Payer: Self-pay | Admitting: Nephrology

## 2020-10-10 ENCOUNTER — Ambulatory Visit: Payer: Medicare HMO | Admitting: *Deleted

## 2020-10-10 DIAGNOSIS — L409 Psoriasis, unspecified: Secondary | ICD-10-CM

## 2020-10-10 DIAGNOSIS — E1122 Type 2 diabetes mellitus with diabetic chronic kidney disease: Secondary | ICD-10-CM

## 2020-10-10 DIAGNOSIS — I10 Essential (primary) hypertension: Secondary | ICD-10-CM

## 2020-10-10 DIAGNOSIS — N184 Chronic kidney disease, stage 4 (severe): Secondary | ICD-10-CM

## 2020-10-10 DIAGNOSIS — N183 Chronic kidney disease, stage 3 unspecified: Secondary | ICD-10-CM

## 2020-10-10 DIAGNOSIS — I63532 Cerebral infarction due to unspecified occlusion or stenosis of left posterior cerebral artery: Secondary | ICD-10-CM

## 2020-10-10 DIAGNOSIS — L400 Psoriasis vulgaris: Secondary | ICD-10-CM | POA: Diagnosis not present

## 2020-10-10 MED FILL — SPS 15 GM/60 ML SUSPENSION: 15 | 1 days supply | Qty: 120 | Fill #0

## 2020-10-10 NOTE — Chronic Care Management (AMB) (Signed)
Chronic Care Management   Follow Up Note   10/10/2020 Name: Derrick Hendrix. MRN: [id number] DOB: 1941-12-10  Referred by: Derrick Rack, MD Reason for referral : Chronic Care Management ( NIDDM, HTN, HLD, CKD, A fib, s/p CVA with left sided spasticity )   [name]. is a 79 y.o. year old male who is a primary care patient of [name], Derrick Staggers, MD. The CCM team was consulted for assistance with chronic disease management and care coordination needs.    Review of patient status, including review of consultants reports, relevant laboratory and other test results, and collaboration with appropriate care team members and the patient's provider was performed as part of comprehensive patient evaluation and provision of chronic care management services.    SDOH (Social Determinants of Health) assessments performed: No See Care Plan activities for detailed interventions related to [facility])     Outpatient Encounter Medications as of 10/10/2020  Medication Sig   amLODipine (NORVASC) 10 MG tablet TAKE ONE TABLET BY MOUTH DAILY (Patient taking differently: Take 10 mg by mouth daily. )   apixaban (ELIQUIS) 2.5 MG TABS tablet Take 1 tablet (2.5 mg total) by mouth 2 (two) times daily.   atorvastatin (LIPITOR) 80 MG tablet TAKE ONE TABLET BY MOUTH DAILY (Patient taking differently: Take 80 mg by mouth daily. )   bisacodyl (DULCOLAX) 5 MG EC tablet Take 1 tablet (5 mg total) by mouth daily as needed for moderate constipation.   Blood Pressure Monitoring (BLOOD PRESSURE KIT) DEVI Please check blood pressure twice a day. In the morning and evening. Please record numbers on a sheet of paper. Blood pressure parameters are 120-150/80-90   calcitRIOL (ROCALTROL) 0.25 MCG capsule Take 0.25 mcg by mouth daily.   camphor-menthol (SARNA) lotion Apply topically as needed for itching.   clobetasol cream (TEMOVATE) 0.05 % Apply topically 2 (two) times daily.   clonazePAM (KLONOPIN) 0.25 MG disintegrating  tablet TAKE ONE TABLET BY MOUTH THREE TIMES A DAY   Darbepoetin Alfa (ARANESP) 100 MCG/0.5ML SOSY injection Inject 0.5 mLs (100 mcg total) into the skin every Saturday at 6 PM.   docusate sodium (COLACE) 100 MG capsule Take 1 capsule (100 mg total) by mouth daily.   finasteride (PROSCAR) 5 MG tablet TAKE ONE TABLET BY MOUTH DAILY (Patient taking differently: Take 5 mg by mouth daily. )   fludrocortisone (FLORINEF) 0.1 MG tablet TAKE ONE TABLET BY MOUTH TWICE A DAY   furosemide (LASIX) 20 MG tablet TAKE ONE TABLET BY MOUTH DAILY (Patient taking differently: Take 20 mg by mouth daily. )   hydrALAZINE (APRESOLINE) 10 MG tablet Take 1.5 tablets (15 mg total) by mouth 3 (three) times daily.   Multiple Vitamins-Minerals (DECUBI-VITE) CAPS Take 1 capsule by mouth daily. (Patient not taking: Reported on 08/23/2020)   nitroGLYCERIN (NITROSTAT) 0.4 MG SL tablet Place 1 tablet (0.4 mg total) under the tongue every 5 (five) minutes x 3 doses as needed for chest pain.   predniSONE (DELTASONE) 10 MG tablet Take 10 mg by mouth daily.    sodium polystyrene (KAYEXALATE) 15 GM/60ML suspension Take 120 mLs (30 g total) by mouth 3 (three) times a week.   tamsulosin (FLOMAX) 0.4 MG CAPS capsule TAKE TWO CAPSULES BY MOUTH DAILY   triamcinolone ointment (KENALOG) 0.1 % Apply 1 application topically 2 (two) times daily.   No facility-administered encounter medications on file as of 10/10/2020.     Objective:  Lab Results  Component Value Date   CREATININE 3.92 (H) 09/12/2020  BUN 60 (H) 09/12/2020   NA 142 09/12/2020   K 5.3 (H) 09/12/2020   CL 110 09/12/2020   CO2 20 (L) 09/12/2020   Wt Readings from Last 3 Encounters:  09/17/20 205 lb (93 kg)  09/12/20 211 lb 8 oz (95.9 kg)  08/25/20 209 lb 7 oz (95 kg)   BP Readings from Last 3 Encounters:  10/04/20 (!) 148/82  09/27/20 (!) 145/81  09/20/20 (!) 142/99    Goals Addressed              This Visit's Progress     Patient Stated      COMPLETED: " I have a rash all over my body, even on my head and it itches and burns so bad I can't sleep." (pt-stated)        CARE PLAN ENTRY (see longitudinal plan of care for additional care plan information)  Current Barriers:   Knowledge Deficits related to managing pruritic rash-spoke with patient to complete follow up assessment, he says the rash has "dried up" and no longer bothers him  Nurse Case Manager Clinical Goal(s):   Over the next 30 -60 days, patient will keep his appointment with all providers  Interventions:   Inter-disciplinary care team collaboration (see longitudinal plan of care)  Assessed status of rash  Patient Self Care Activities:   Patient verbalizes understanding of plan to make acute clinic appointment  Self administers medications as prescribed  Attends all scheduled provider appointments  Calls pharmacy for medication refills  Calls provider office for new concerns or questions   Please see past updates related to this goal by clicking on the "Past Updates" button in the selected goal        'I don't know what foods have a lot of potassium in them." (pt-stated)        CARE PLAN ENTRY (see longitudinal plan of care for additional care plan information)  Current Barriers:   Chronic Disease Management support and education needs related to HTN and CKD  and hyporeninemic hypoaldosteronism- called patient at niece's (Tracey's) request,  patient states he is taking his medications as prescribed, he says he's worried because kidney doctor keeps telling him his K+ is high and he has to be careful about what he is eating, he says he's eating very little because he doesn't know what to avoid. He also reported that he had left leg and foot pain after he had an ultrasound and ABI's of his extremities on 09/17/20, he says the pain is subsiding with each day   Nurse Case Manager Clinical Goal(s):   Over the next 30- 60 days, patient will verbalize basic  understanding of HTN , CKD and  hyporeninemic hypoaldosteronism disease process and self health management plan as evidenced by voicing understanding of any medication changes , taking medications as prescribed and following recommended diet  Interventions:   Appropriate assessments  completed   Assessed medication taking behavior  Discussed possible causes of left foot and leg pain after the ultrasound and ABI procedure on 09/17/20  At patient's request will mail him and his niece [name] ( she grocery shops for him) a list of the top 10 foods high in K   Encouraged him to eat well balanced meals and only avoid those foods on the list   Reviewed upcoming appointments and ensured he has transportation  Patient Self Care Activities:   Self administers medications as prescribed  Attends all scheduled provider appointments  Calls provider office for new  concerns or questions  Patient voices understanding of any medication changes and changes to pill packs  Patient voices understanding of diet recommendations  Unable to perform IADLs independently- uses [employer] for transportation  Please see past updates related to this goal by clicking on the "Past Updates" button in the selected goal          Plan:   The care management team will reach out to the patient again over the next 30-60 days.    Kelli Churn RN, CCM, [facility] RN Care Manager [phone]

## 2020-10-10 NOTE — Progress Notes (Signed)
Internal Medicine Clinic Resident ° °I have personally reviewed this encounter including the documentation in this note and/or discussed this patient with the care management provider. I will address any urgent items identified by the care management provider and will communicate my actions to the patient's PCP. I have reviewed the patient's CCM visit with my supervising attending, Dr Williams. ° °Cheo Selvey K Kerem Gilmer, MD °10/10/2020 ° ° ° °

## 2020-10-10 NOTE — Patient Instructions (Signed)
Visit Information Please try to eat a well balanced diet only avoding those foods very high in potassium on the list that I mailed you. Goals Addressed              This Visit's Progress     Patient Stated   .  COMPLETED: " I have a rash all over my body, even on my head and it itches and burns so bad I can't sleep." (pt-stated)        CARE PLAN ENTRY (see longitudinal plan of care for additional care plan information)  Current Barriers:  [name] Knowledge Deficits related to managing pruritic rash-spoke with patient to complete follow up assessment, he says the rash has "dried up" and no longer bothers him  Nurse Case Manager Clinical Goal(s):  [name] Over the next 30 -60 days, patient will keep his appointment with all providers  Interventions:  . Inter-disciplinary care team collaboration (see longitudinal plan of care) . Assessed status of rash  Patient Self Care Activities:  . Patient verbalizes understanding of plan to make acute clinic appointment . Self administers medications as prescribed . Attends all scheduled provider appointments . Calls pharmacy for medication refills . Calls provider office for new concerns or questions   Please see past updates related to this goal by clicking on the "Past Updates" button in the selected goal      .  'I don't know what foods have a lot of potassium in them." (pt-stated)        CARE PLAN ENTRY (see longitudinal plan of care for additional care plan information)  Current Barriers:  . Chronic Disease Management support and education needs related to HTN and CKD  and hyporeninemic hypoaldosteronism- called patient at niece's (Tracey's) request,  patient states he is taking his medications as prescribed, he says he's worried because kidney doctor keeps telling him his K+ is high and he has to be careful about what he is eating, he says he's eating very little because he doesn't know what to avoid. He also reported that he had left leg and  foot pain after he had an ultrasound and ABI's of his extremities on 09/17/20, he says the pain is subsiding with each day .  Nurse Case Manager Clinical Goal(s):  [name] Over the next 30- 60 days, patient will verbalize basic understanding of HTN , CKD and  hyporeninemic hypoaldosteronism disease process and self health management plan as evidenced by voicing understanding of any medication changes , taking medications as prescribed and following recommended diet  Interventions:  . Appropriate assessments  completed  . Assessed medication taking behavior . Discussed possible causes of left foot and leg pain after the ultrasound and ABI procedure on 09/17/20 . At patient's request will mail him and his niece [name] ( she grocery shops for him) a list of the top 10 foods high in K  . Encouraged him to eat well balanced meals and only avoid those foods on the list  . Reviewed upcoming appointments and ensured he has transportation  Patient Self Care Activities:  . Self administers medications as prescribed . Attends all scheduled provider appointments . Calls provider office for new concerns or questions . Patient voices understanding of any medication changes and changes to pill packs . Patient voices understanding of diet recommendations . Unable to perform IADLs independently- uses [employer] for transportation  Please see past updates related to this goal by clicking on the "Past Updates" button in the selected goal  The patient verbalized understanding of instructions provided today and declined a print copy of patient instruction materials.   The care management team will reach out to the patient again over the next 30-60 days.  Kelli Churn RN, CCM, [facility] RN Care Manager [phone]

## 2020-10-11 ENCOUNTER — Encounter: Payer: Self-pay | Admitting: *Deleted

## 2020-10-11 ENCOUNTER — Encounter (HOSPITAL_COMMUNITY): Payer: Medicare HMO

## 2020-10-11 DIAGNOSIS — E875 Hyperkalemia: Secondary | ICD-10-CM | POA: Diagnosis not present

## 2020-10-16 ENCOUNTER — Other Ambulatory Visit: Payer: Self-pay | Admitting: Internal Medicine

## 2020-10-16 DIAGNOSIS — I10 Essential (primary) hypertension: Secondary | ICD-10-CM

## 2020-10-16 DIAGNOSIS — E875 Hyperkalemia: Secondary | ICD-10-CM

## 2020-10-16 DIAGNOSIS — N184 Chronic kidney disease, stage 4 (severe): Secondary | ICD-10-CM

## 2020-10-18 ENCOUNTER — Inpatient Hospital Stay (HOSPITAL_COMMUNITY): Admission: RE | Admit: 2020-10-18 | Payer: Medicare HMO | Source: Ambulatory Visit

## 2020-10-19 ENCOUNTER — Telehealth: Payer: Medicare HMO

## 2020-10-24 ENCOUNTER — Other Ambulatory Visit: Payer: Self-pay

## 2020-10-24 ENCOUNTER — Encounter (HOSPITAL_COMMUNITY)
Admission: RE | Admit: 2020-10-24 | Discharge: 2020-10-24 | Disposition: A | Discharge status: Home / Self Care | Payer: Medicare HMO | Source: Ambulatory Visit | Attending: Nephrology | Admitting: Nephrology

## 2020-10-24 ENCOUNTER — Telehealth: Payer: Medicare HMO

## 2020-10-24 VITALS — BP 121/81 | HR 88 | Temp 97.6°F | Resp 20

## 2020-10-24 DIAGNOSIS — N184 Chronic kidney disease, stage 4 (severe): Secondary | ICD-10-CM | POA: Insufficient documentation

## 2020-10-24 LAB — POCT HEMOGLOBIN-HEMACUE: Hemoglobin: 14.1 g/dL (ref 13.0–17.0)

## 2020-10-24 MED ORDER — EPOETIN ALFA 10000 UNIT/ML IJ SOLN: 10000.0000 [IU] | INTRAMUSCULAR | Status: DC

## 2020-10-25 ENCOUNTER — Encounter: Payer: Self-pay | Admitting: *Deleted

## 2020-10-29 ENCOUNTER — Ambulatory Visit: Payer: Medicare HMO | Admitting: *Deleted

## 2020-10-29 DIAGNOSIS — I63532 Cerebral infarction due to unspecified occlusion or stenosis of left posterior cerebral artery: Secondary | ICD-10-CM

## 2020-10-29 DIAGNOSIS — N184 Chronic kidney disease, stage 4 (severe): Secondary | ICD-10-CM

## 2020-10-29 DIAGNOSIS — N183 Chronic kidney disease, stage 3 unspecified: Secondary | ICD-10-CM

## 2020-10-29 DIAGNOSIS — I10 Essential (primary) hypertension: Secondary | ICD-10-CM

## 2020-10-29 NOTE — Patient Instructions (Signed)
Visit Information It was nice speaking with you today.  Goals Addressed              This Visit's Progress     Patient Stated   .  COMPLETED: 'I don't know what foods have a lot of potassium in them." (pt-stated)        CARE PLAN ENTRY (see longitudinal plan of care for additional care plan information)  Current Barriers:  . Chronic Disease Management support and education needs related to HTN and CKD  and hyporeninemic hypoaldosteronism- spoke with patient to complete follow up assessment , patient states he is doing well, he says he read over the information regarding the low K+ foods and he doesn't have any questions at this time  .  Nurse Case Manager Clinical Goal(s):  [name] Over the next 30- 60 days, patient will verbalize basic understanding of HTN , CKD and  hyporeninemic hypoaldosteronism disease process and self health management plan as evidenced by voicing understanding of any medication changes , taking medications as prescribed and following recommended diet  Interventions:  . Appropriate assessments  completed  . Assessed medication taking behavior . Provided opportunity for patient to ask questions about the information mailed to him on high K+ foods so he knows those to avoid . Encouraged him to eat well balanced meals and only avoid those foods on the list  . Reviewed upcoming appointments and ensured he has transportation  Patient Self Care Activities:  . Self administers medications as prescribed . Attends all scheduled provider appointments . Calls provider office for new concerns or questions . Patient voices understanding of any medication changes and changes to pill packs . Patient voices understanding of diet recommendations . Unable to perform IADLs independently- uses [employer] for transportation  Please see past updates related to this goal by clicking on the "Past Updates" button in the selected goal         The patient verbalized understanding of  instructions, educational materials, and care plan provided today and agreed to receive a mailed copy of patient instructions, educational materials, and care plan.   The care management team will reach out to the patient again over the next 30-60 days.   Kelli Churn RN, CCM, [facility] RN Care Manager [phone]

## 2020-10-29 NOTE — Chronic Care Management (AMB) (Signed)
Chronic Care Management   Follow Up Note   10/29/2020 Name: Derrick Hendrix. MRN: [id number] DOB: [birth year]  Referred by: [name], MD Reason for referral : Chronic Care Management (NIDDM, HTN, HLD, CKD, A fib, s/p CVA with left sided spasticity )   Derrick Hendrix. is a 79 y.o. year old male who is a primary care patient of [name], [name], MD. The CCM team was consulted for assistance with chronic disease management and care coordination needs.    Review of patient status, including review of consultants reports, relevant laboratory and other test results, and collaboration with appropriate care team members and the patient's provider was performed as part of comprehensive patient evaluation and provision of chronic care management services.    SDOH (Social Determinants of Health) assessments performed: No See Care Plan activities for detailed interventions related to [facility])     Outpatient Encounter Medications as of 10/29/2020  Medication Sig  . amLODipine (NORVASC) 10 MG tablet TAKE ONE TABLET BY MOUTH DAILY  . apixaban (ELIQUIS) 2.5 MG TABS tablet Take 1 tablet (2.5 mg total) by mouth 2 (two) times daily.  [name] atorvastatin (LIPITOR) 80 MG tablet TAKE ONE TABLET BY MOUTH DAILY  . bisacodyl (DULCOLAX) 5 MG EC tablet Take 1 tablet (5 mg total) by mouth daily as needed for moderate constipation.  . Blood Pressure Monitoring (BLOOD PRESSURE KIT) DEVI Please check blood pressure twice a day. In the morning and evening. Please record numbers on a sheet of paper. Blood pressure parameters are 120-150/80-90  . calcitRIOL (ROCALTROL) 0.25 MCG capsule Take 0.25 mcg by mouth daily.  . camphor-menthol (SARNA) lotion Apply topically as needed for itching.  . clobetasol cream (TEMOVATE) 0.05 % Apply topically 2 (two) times daily.  . clonazePAM (KLONOPIN) 0.25 MG disintegrating tablet TAKE ONE TABLET BY MOUTH THREE TIMES A DAY  . Darbepoetin Alfa (ARANESP) 100 MCG/0.5ML SOSY injection Inject  0.5 mLs (100 mcg total) into the skin every Saturday at 6 PM.  . docusate sodium (COLACE) 100 MG capsule Take 1 capsule (100 mg total) by mouth daily.  . finasteride (PROSCAR) 5 MG tablet TAKE ONE TABLET BY MOUTH DAILY  . fludrocortisone (FLORINEF) 0.1 MG tablet TAKE ONE TABLET BY MOUTH TWICE A DAY  . furosemide (LASIX) 20 MG tablet TAKE ONE TABLET BY MOUTH DAILY  . hydrALAZINE (APRESOLINE) 10 MG tablet Take 1.5 tablets (15 mg total) by mouth 3 (three) times daily.  . Multiple Vitamins-Minerals (DECUBI-VITE) CAPS Take 1 capsule by mouth daily. (Patient not taking: Reported on 08/23/2020)  . nitroGLYCERIN (NITROSTAT) 0.4 MG SL tablet Place 1 tablet (0.4 mg total) under the tongue every 5 (five) minutes x 3 doses as needed for chest pain.  . predniSONE (DELTASONE) 10 MG tablet Take 10 mg by mouth daily.   . sodium polystyrene (KAYEXALATE) 15 GM/60ML suspension Take 120 mLs (30 g total) by mouth 3 (three) times a week.  . tamsulosin (FLOMAX) 0.4 MG CAPS capsule TAKE TWO CAPSULES BY MOUTH DAILY  . triamcinolone ointment (KENALOG) 0.1 % Apply 1 application topically 2 (two) times daily.   No facility-administered encounter medications on file as of 10/29/2020.     Objective:  Wt Readings from Last 3 Encounters:  09/17/20 205 lb (93 kg)  09/12/20 211 lb 8 oz (95.9 kg)  08/25/20 209 lb 7 oz (95 kg)   BP Readings from Last 3 Encounters:  10/24/20 121/81  10/04/20 (!) 148/82  09/27/20 (!) 145/81   Lab Results  Component  Value Date   CHOL 159 09/12/2020   HDL 60 09/12/2020   LDLCALC 83 09/12/2020   TRIG 79 09/12/2020   CHOLHDL 2.7 09/12/2020   Lab Results  Component Value Date   HGBA1C 4.8 09/12/2020   HGBA1C 5.5 02/20/2020   HGBA1C 6.0 (A) 11/16/2019   Lab Results  Component Value Date   LDLCALC 83 09/12/2020   CREATININE 3.92 (H) 09/12/2020   Lab Results  Component Value Date   CREATININE 3.92 (H) 09/12/2020   BUN 60 (H) 09/12/2020   NA 142 09/12/2020   K 5.3 (H) 09/12/2020     CL 110 09/12/2020   CO2 20 (L) 09/12/2020   Goals Addressed              This Visit's Progress     Patient Stated   .  COMPLETED: 'I don't know what foods have a lot of potassium in them." (pt-stated)        CARE PLAN ENTRY (see longitudinal plan of care for additional care plan information)  Current Barriers:  . Chronic Disease Management support and education needs related to HTN and CKD  and hyporeninemic hypoaldosteronism- spoke with patient to complete follow up assessment , patient states he is doing well, he says he read over the information regarding the low K+ foods and he doesn't have any questions at this time  .  Nurse Case Manager Clinical Goal(s):  [name] Over the next 30- 60 days, patient will verbalize basic understanding of HTN , CKD and  hyporeninemic hypoaldosteronism disease process and self health management plan as evidenced by voicing understanding of any medication changes , taking medications as prescribed and following recommended diet  Interventions:  . Appropriate assessments  completed  . Assessed medication taking behavior . Provided opportunity for patient to ask questions about the information mailed to him on high K+ foods so he knows those to avoid . Encouraged him to eat well balanced meals and only avoid those foods on the list  . Reviewed upcoming appointments and ensured he has transportation  Patient Self Care Activities:  . Self administers medications as prescribed . Attends all scheduled provider appointments . Calls provider office for new concerns or questions . Patient voices understanding of any medication changes and changes to pill packs . Patient voices understanding of diet recommendations . Unable to perform IADLs independently- uses [employer] for transportation  Please see past updates related to this goal by clicking on the "Past Updates" button in the selected goal          Plan:   The care management team will reach out to  the patient again over the next 30-60 days.    Kelli Churn RN, CCM, [facility] RN Care Manager [phone]

## 2020-10-30 ENCOUNTER — Encounter: Payer: Self-pay | Admitting: Gastroenterology

## 2020-10-31 ENCOUNTER — Other Ambulatory Visit: Payer: Self-pay | Admitting: Internal Medicine

## 2020-10-31 ENCOUNTER — Encounter (HOSPITAL_COMMUNITY): Payer: Medicare HMO

## 2020-10-31 DIAGNOSIS — R339 Retention of urine, unspecified: Secondary | ICD-10-CM

## 2020-10-31 NOTE — Progress Notes (Signed)
Internal Medicine Clinic Resident  I have personally reviewed this encounter including the documentation in this note and/or discussed this patient with the care management provider. I will address any urgent items identified by the care management provider and will communicate my actions to the patient's PCP. I have reviewed the patient's CCM visit with my supervising attending, Dr Vincent.  Quentin Shorey, MD 10/31/2020  

## 2020-10-31 NOTE — Progress Notes (Signed)
Internal Medicine Clinic Attending  CCM services provided by the care management provider and their documentation were discussed with Dr. Basaraba. We reviewed the pertinent findings, urgent action items addressed by the resident and non-urgent items to be addressed by the PCP.  I agree with the assessment, diagnosis, and plan of care documented in the CCM and resident's note.  Aj Crunkleton Thomas Anguel Delapena, MD 10/31/2020  

## 2020-11-05 ENCOUNTER — Encounter (HOSPITAL_COMMUNITY)
Admission: RE | Admit: 2020-11-05 | Discharge: 2020-11-05 | Disposition: A | Discharge status: Home / Self Care | Payer: Medicare HMO | Source: Ambulatory Visit | Attending: Nephrology | Admitting: Nephrology

## 2020-11-05 ENCOUNTER — Other Ambulatory Visit: Payer: Self-pay

## 2020-11-05 VITALS — BP 174/97 | HR 91 | Temp 97.5°F | Resp 20

## 2020-11-05 DIAGNOSIS — N184 Chronic kidney disease, stage 4 (severe): Secondary | ICD-10-CM

## 2020-11-05 LAB — POCT HEMOGLOBIN-HEMACUE: Hemoglobin: 14.6 g/dL (ref 13.0–17.0)

## 2020-11-05 MED ORDER — EPOETIN ALFA 10000 UNIT/ML IJ SOLN: 10000.0000 [IU] | INTRAMUSCULAR | Status: DC

## 2020-11-07 DIAGNOSIS — N184 Chronic kidney disease, stage 4 (severe): Secondary | ICD-10-CM | POA: Diagnosis not present

## 2020-11-12 ENCOUNTER — Encounter (HOSPITAL_COMMUNITY): Payer: Medicare HMO

## 2020-11-13 ENCOUNTER — Other Ambulatory Visit: Payer: Self-pay | Admitting: Student

## 2020-11-13 DIAGNOSIS — L409 Psoriasis, unspecified: Secondary | ICD-10-CM

## 2020-11-13 DIAGNOSIS — K5900 Constipation, unspecified: Secondary | ICD-10-CM

## 2020-11-14 DIAGNOSIS — N184 Chronic kidney disease, stage 4 (severe): Secondary | ICD-10-CM | POA: Diagnosis not present

## 2020-11-15 ENCOUNTER — Ambulatory Visit: Payer: Medicare HMO | Admitting: *Deleted

## 2020-11-15 ENCOUNTER — Telehealth: Payer: Self-pay | Admitting: *Deleted

## 2020-11-15 DIAGNOSIS — N183 Chronic kidney disease, stage 3 unspecified: Secondary | ICD-10-CM

## 2020-11-15 DIAGNOSIS — N184 Chronic kidney disease, stage 4 (severe): Secondary | ICD-10-CM

## 2020-11-15 DIAGNOSIS — I63532 Cerebral infarction due to unspecified occlusion or stenosis of left posterior cerebral artery: Secondary | ICD-10-CM

## 2020-11-15 NOTE — Progress Notes (Signed)
Internal Medicine Clinic Resident  I have personally reviewed this encounter including the documentation in this note and/or discussed this patient with the care management provider. I will address any urgent items identified by the care management provider and will communicate my actions to the patient's PCP. I have reviewed the patient's CCM visit with my supervising attending, Dr Raines.  Tanaya Dunigan K Keonta Monceaux, MD 11/15/2020   

## 2020-11-15 NOTE — Chronic Care Management (AMB) (Addendum)
  Chronic Care Management   Note  11/15/2020 Name: Harland Aguiniga. MRN: [id number] DOB: Oct 14, 1941   Returned call to patient's niece in response to voice mail left earlier today. [name] states patient is c/o mild nosebleeds when he blows his nose and states his "heart monitor is acting up", she says he did not c/o chest pain or dyspnea. [name] is also voicing concern that patient K+ is very unstable but was told this week the level is very high. [name] says she is concerned about patient's ongoing ability to live alone without assistance - he does not eat well, has difficulty with all ADL's/IADL's and she is unable to provide additional help other than IADL assistance. [place] to make acute clinic appointment for patient ASAP and this CCM RN will ask the provider to discuss PCS and PACE program with patient although patient has refused these resources in the past.    Follow up plan: The care management team will reach out to the patient again over the next 7 days.   Kelli Churn RN, CCM, [facility] RN Care Manager [phone]

## 2020-11-15 NOTE — Telephone Encounter (Signed)
Patient's niece called in stating patient is c/o dizziness, fatigue, and blood on tissue when he blows his nose. Denies actual nose bleed. Also c/o feeling like a device in his chest is moving. Offered appt today but she declined 2/2 transportation issues. First available appt made for 11/19/2020 with PCP. Niece will contact [facility] now for c/o device movement. Hubbard Hartshorn, BSN, RN-BC

## 2020-11-19 ENCOUNTER — Ambulatory Visit: Payer: Medicare HMO | Admitting: *Deleted

## 2020-11-19 ENCOUNTER — Ambulatory Visit (HOSPITAL_COMMUNITY)
Admission: RE | Admit: 2020-11-19 | Discharge: 2020-11-19 | Disposition: A | Discharge status: Home / Self Care | Payer: Medicare HMO | Source: Ambulatory Visit | Attending: Nephrology | Admitting: Nephrology

## 2020-11-19 ENCOUNTER — Encounter: Payer: Self-pay | Admitting: Internal Medicine

## 2020-11-19 ENCOUNTER — Telehealth: Payer: Self-pay | Admitting: Internal Medicine

## 2020-11-19 ENCOUNTER — Other Ambulatory Visit: Payer: Self-pay

## 2020-11-19 ENCOUNTER — Ambulatory Visit (INDEPENDENT_AMBULATORY_CARE_PROVIDER_SITE_OTHER): Payer: Medicare HMO | Admitting: Internal Medicine

## 2020-11-19 VITALS — BP 133/69 | HR 84 | Temp 97.5°F | Resp 20

## 2020-11-19 VITALS — BP 149/72 | HR 84 | Temp 97.8°F | Ht 74.0 in | Wt 199.8 lb

## 2020-11-19 DIAGNOSIS — N183 Chronic kidney disease, stage 3 unspecified: Secondary | ICD-10-CM

## 2020-11-19 DIAGNOSIS — N184 Chronic kidney disease, stage 4 (severe): Secondary | ICD-10-CM | POA: Insufficient documentation

## 2020-11-19 DIAGNOSIS — Z0000 Encounter for general adult medical examination without abnormal findings: Secondary | ICD-10-CM

## 2020-11-19 DIAGNOSIS — R040 Epistaxis: Secondary | ICD-10-CM

## 2020-11-19 DIAGNOSIS — B351 Tinea unguium: Secondary | ICD-10-CM | POA: Diagnosis not present

## 2020-11-19 DIAGNOSIS — I10 Essential (primary) hypertension: Secondary | ICD-10-CM

## 2020-11-19 DIAGNOSIS — I63532 Cerebral infarction due to unspecified occlusion or stenosis of left posterior cerebral artery: Secondary | ICD-10-CM

## 2020-11-19 DIAGNOSIS — E1122 Type 2 diabetes mellitus with diabetic chronic kidney disease: Secondary | ICD-10-CM

## 2020-11-19 LAB — IRON AND TIBC
Iron: 122 ug/dL (ref 45–182)
Saturation Ratios: 43 % — ABNORMAL HIGH (ref 17.9–39.5)
TIBC: 283 ug/dL (ref 250–450)
UIBC: 161 ug/dL

## 2020-11-19 LAB — CBC
HCT: 43.6 % (ref 39.0–52.0)
Hemoglobin: 12.9 g/dL — ABNORMAL LOW (ref 13.0–17.0)
MCH: 29 pg (ref 26.0–34.0)
MCHC: 29.6 g/dL — ABNORMAL LOW (ref 30.0–36.0)
MCV: 98 fL (ref 80.0–100.0)
Platelets: 256 10*3/uL (ref 150–400)
RBC: 4.45 MIL/uL (ref 4.22–5.81)
RDW: 15.7 % — ABNORMAL HIGH (ref 11.5–15.5)
WBC: 5.6 10*3/uL (ref 4.0–10.5)
nRBC: 0 % (ref 0.0–0.2)

## 2020-11-19 LAB — BASIC METABOLIC PANEL
Anion gap: 10 (ref 5–15)
BUN: 84 mg/dL — ABNORMAL HIGH (ref 8–23)
CO2: 18 mmol/L — ABNORMAL LOW (ref 22–32)
Calcium: 8.8 mg/dL — ABNORMAL LOW (ref 8.9–10.3)
Chloride: 116 mmol/L — ABNORMAL HIGH (ref 98–111)
Creatinine, Ser: 4.87 mg/dL — ABNORMAL HIGH (ref 0.61–1.24)
GFR, Estimated: 11 mL/min — ABNORMAL LOW (ref >60)
Glucose, Bld: 94 mg/dL (ref 70–99)
Potassium: 6 mmol/L — ABNORMAL HIGH (ref 3.5–5.1)
Sodium: 144 mmol/L (ref 135–145)

## 2020-11-19 LAB — FERRITIN: Ferritin: 236 ng/mL (ref 24–336)

## 2020-11-19 LAB — POCT HEMOGLOBIN-HEMACUE: Hemoglobin: 13.3 g/dL (ref 13.0–17.0)

## 2020-11-19 MED ORDER — EPOETIN ALFA 10000 UNIT/ML IJ SOLN: 10000.0000 [IU] | INTRAMUSCULAR | Status: DC

## 2020-11-19 NOTE — Assessment & Plan Note (Signed)
Epistasis: Patient's niece had reported several episodes of blood on tissue whenever patient sneezed.  Patient reports that this episode occurred last week and has since not happened again.  He denies dizziness, lightheadedness, fatigue, chest pain, palpitation, shortness of breath or any sequelae that would indicate acute blood loss.  He does have history of atrial fibrillation for which she is on Eliquis 2.5 mg twice daily.  Assessment and plan: Follow-up CBC today however given that epistasis has ceased, I would like to continue his Eliquis.

## 2020-11-19 NOTE — Progress Notes (Signed)
   CC: CKD IV, encounter to discuss assisted living facility/PACE/PCS  HPI:  [name]. is a 79 y.o. with medical history listed below presented to follow-up on chronic medical problems.  His main concern today is dry mouth which he attributes to not being able to drink or eat anything after taking his V[place].  Due to the dry mouth, he states that he decided to stop taking his medication for 24 hours in expectation that his medications were the culprit for his dry mouth.  Since stopping his medication, his dry mouth has still persisted.  He is to resume back his medication and try keeping his oral mucosa moist with sips of fluids.  Please see problem based charting for further details.  Past Medical History:  Diagnosis Date  . Abscess of foot without toes, left 08/28/2017  . Chronic kidney disease (CKD), stage III (moderate) (HCC)   . Constipation 11/07/2014  . Daily headache    "lately" (04/17/2016)  . Depression   . Dizziness 03/07/2016  . Headache 06/26/2014  . Heart murmur   . Hyperkalemia 10/05/2018  . Hyperlipemia 11/11/2016  . Hypertension   . Neuropathic pain 02/06/2015  . Osteomyelitis ([place])   . PAD (peripheral artery disease) ([place])   . Paronychia of great toe, right   . Preop cardiovascular exam   . Right foot pain 02/09/2017  . Stable angina ([place]) 06/27/2014  . Stroke ([place]) 03/2016   hx of PAD; j"ust lots of headaches since" (04/17/2016)  . Vitamin B12 deficiency 06/27/2014  . Weight loss, unintentional 06/27/2014   Review of Systems:  As per HPI  Physical Exam:  Vitals:   11/19/20 1316  BP: (!) 149/72  Pulse: 84  Temp: 97.8 F (36.6 C)  TempSrc: Oral  SpO2: 100%  Weight: 199 lb 12.8 oz (90.6 kg)  Height: 6\' 2"  (1.88 m)   Physical Exam Vitals reviewed.  HENT:     Mouth/Throat:     Mouth: Mucous membranes are dry.  Cardiovascular:     Heart sounds: Normal heart sounds. No murmur heard.   Pulmonary:     Effort: No respiratory distress.     Breath  sounds: Normal breath sounds.  Skin:    General: Skin is dry.     Findings: Rash present.  Psychiatric:        Mood and Affect: Mood normal.        Behavior: Behavior normal.     Assessment & Plan:   See Encounters Tab for problem based charting.  Patient discussed with Dr. Dareen Piano

## 2020-11-19 NOTE — Patient Instructions (Addendum)
Derrick Hendrix,  Thanks for seeing me today.  As we discussed, I am going to check blood work on you to see how you are kidney numbers are doing.  I am glad that you started conversation with Derrick Hendrix to discuss getting more assistance.  For your dry mouth, I would recommend that take sips of water and you can swish them around in your mouth.  Take care! Dr. Eileen Stanford  Please call the internal medicine center clinic if you have any questions or concerns, we may be able to help and keep you from a long and expensive emergency room wait. Our clinic and after hours phone number is [phone], the best time to call is Monday through Friday 9 am to 4 pm but there is always someone available 24/7 if you have an emergency. If you need medication refills please notify your pharmacy one week in advance and they will send [place] a request.   If you have not gotten the COVID vaccine, I recommend doing so:  You may get it at your local [facility] or [facility] OR To schedule an appointment for a COVID vaccine or be added to the vaccine wait list: Go to [email or web address]   OR Go to [email or web address]                  OR Call [phone]                                     OR Call [phone] and select Option 2

## 2020-11-19 NOTE — Progress Notes (Signed)
Internal Medicine Clinic Resident  I have personally reviewed this encounter including the documentation in this note and/or discussed this patient with the care management provider. I will address any urgent items identified by the care management provider and will communicate my actions to the patient's PCP. I have reviewed the patient's CCM visit with my supervising attending, Dr Narendra.  Matt Aaima Gaddie, MD 11/19/2020   

## 2020-11-19 NOTE — Patient Instructions (Signed)
Visit Information It was nice speaking with you today. Patient Care Plan: Patient will investigate PACE and [place] with his niece [name].    Problem Identified: Patient needs assitance with ADLS and IADLS.   Priority: High  Onset Date: 11/19/2020    Goal: Self-Management Plan Developed   Start Date: 11/19/2020  Expected End Date: 03/14/2021  This Visit's Progress: On track  Priority: High  Note:   Current Barriers:  . Care Coordination needs related to alternate housing and/or community assistance in a patient with NIDDM, HTN, HLD, CKD, A fib, s/p CVA with left sided spasticity - long discussion with patient during his clinic visit to address ADL/IADL deficits and to offer options for assistance; patient does not want to pursue personal care services application and does agree to investigate PACE and assisted living facilities . Unable to perform ADLs independently . Unable to perform IADLs independently  Nurse Case Manager Clinical Goal(s):  [name] Over the next 30-60 days, patient will work with CCM clinical social worker to investigate PACE and assisted living facilities  Interventions:  . Inter-disciplinary care team collaboration (see longitudinal plan of care) . - choices provided- gave patient PACE brochure and information packet, discussed assisted living facilities- assistance they provide and payment  . - collaboration with team encouraged . - decision-making supported . - difficulty of making life-long changes acknowledged . - health risks reviewed . - problem-solving facilitated . - questions answered . - resources needed to meet goals identified . Social Work referral for assistance with optional housing or community assistance to address IADL/ADL deficits  Patient Goals/Self-Care Activities Over the next 30-60 days, patient will:  - work with [employer] and follow up with niece to investigate PACE and assisted living facilities  Follow Up Plan: The care  management team will reach out to the patient again over the next 7-30 days.       Patient Care Plan: Patient needs assitance with establishing new  podiatrist as he is unable to perform nail care    Problem Identified: Disease Progression (Diabetes, Type 2)   Priority: Medium  Onset Date: 11/19/2020  Note:   Current Barriers:  . Care Coordination needs related to foot care in a patient with NIDDM, HTN, HLD, CKD, A fib, s/p CVA with left sided spasticity  . Unable to independently secure new podiatrist- patient states he cannot cut his toenails but had a bad experience with a previous podiatrist so is seeking to establish with a new one  Nurse Case Manager Clinical Goal(s):  [name] Over the next 30 days, patient will meet with RN Care Manager to address establishing a new podiatrist  Interventions:  . Inter-disciplinary care team collaboration (see longitudinal plan of care) . Asked clinic provider for referral to podiatrist . At patient's request provided names of local podiatrist avoiding prior podiatrist to assist patient in securing appointment with new podiatrist  Ensure completion of annual comprehensive foot exam and dilated eye exam.    Implement additional individualized goals and interventions based on identified risk factors.   Prepare patient for referral for specialist care; podiatry  Patient Goals/Self-Care Activities Over the next 30 days, patient will:  -  make and keep appointment with new podiatrist to complete nail and foot care  Follow Up Plan: The care management team will reach out to the patient again over the next 30-60 days.         The patient verbalized understanding of instructions, educational materials, and care plan provided  today and declined offer to receive copy of patient instructions, educational materials, and care plan.   The care management team will reach out to the patient again over the next 7-30 days.   Kelli Churn RN, CCM, [facility] RN Care Manager [phone]

## 2020-11-19 NOTE — Assessment & Plan Note (Signed)
CKD stage IV: He follows up with nephrology and it appears that his last visit was October 09, 2020 and was noted to have worsening renal function.  His chronic kidney disease is thought to be secondary to chronic glomerulonephritis.  After further discussions with patient and medical team (nephrology, vascular surgery), he elected not to proceed with dialysis given his chronic diffuse skin rash and left-sided hemiballismus.  The plan from nephrology is to follow his renal function closely and optimize his electrolytes.  He does have a tendency to have hyperkalemia and thus is on Veltassa daily.  Plan: -Continue to follow-up with nephrology. -Continue Veltassa daily -Follow-up BMP, CBC -Avoid nephrotoxins, ACE inhibitor, ARB.

## 2020-11-19 NOTE — Assessment & Plan Note (Signed)
Chronic care management: Derrick Hendrix lives by himself and his daughter/niece is very concerned about him living by himself given his chronic medical problems.  Myself and Marcie Bal (chronic management team) begun having discussion with patient today.  Initially, he was adamant on pursuing either assisted living facility, personal care services, [facility] as he states that he would like to be as independent as possible.  On further discussion, he states that he would be willing to try personal care services as he does not want to give up his independence which I greatly side with him.  Plan: -Follow-up chronic care management

## 2020-11-19 NOTE — Telephone Encounter (Signed)
On review of Derrick Hendrix labs, it appears that he has an AKI with hyperkalemia.  I was able to speak to him and encouraged him to take his Veltassa and report to the emergency department for close monitoring given the dangers of hyperkalemia.  I tried to reach out to his niece but was unable to reach her.  Derrick Hendrix states that he would get in contact with his niece to help him to the emergency department.

## 2020-11-19 NOTE — Chronic Care Management (AMB) (Signed)
Chronic Care Management   Follow Up Note   11/19/2020 Name: Derrick Hendrix. MRN: [id number] DOB: 07/10/1941  Referred by: [name], MD Reason for referral : Chronic Care Management (NIDDM, HTN, HLD, CKD, A fib, s/p CVA with left sided spasticity )   Derrick Hendrix. is a 79 y.o. year old male who is a primary care patient of [name], [name], MD. The CCM team was consulted for assistance with chronic disease management and care coordination needs.    Review of patient status, including review of consultants reports, relevant laboratory and other test results, and collaboration with appropriate care team members and the patient's provider was performed as part of comprehensive patient evaluation and provision of chronic care management services.    SDOH (Social Determinants of Health) assessments performed: No See Care Plan activities for detailed interventions related to [facility])     Outpatient Encounter Medications as of 11/19/2020  Medication Sig  . BISACODYL 5 MG EC tablet TAKE 1 TABLET (5 MG TOTAL) BY MOUTH DAILY AS NEEDED FOR MODERATE CONSTIPATION.  . clobetasol cream (TEMOVATE) 0.05 % APPLY TOPICALLY TWO (TWO) TIMES DAILY.  [name] amLODipine (NORVASC) 10 MG tablet TAKE ONE TABLET BY MOUTH DAILY  . apixaban (ELIQUIS) 2.5 MG TABS tablet Take 1 tablet (2.5 mg total) by mouth 2 (two) times daily.  [name] atorvastatin (LIPITOR) 80 MG tablet TAKE ONE TABLET BY MOUTH DAILY  . Blood Pressure Monitoring (BLOOD PRESSURE KIT) DEVI Please check blood pressure twice a day. In the morning and evening. Please record numbers on a sheet of paper. Blood pressure parameters are 120-150/80-90  . calcitRIOL (ROCALTROL) 0.25 MCG capsule Take 0.25 mcg by mouth daily.  . camphor-menthol (SARNA) lotion Apply topically as needed for itching.  . clonazePAM (KLONOPIN) 0.25 MG disintegrating tablet TAKE ONE TABLET BY MOUTH THREE TIMES A DAY  . Darbepoetin Alfa (ARANESP) 100 MCG/0.5ML SOSY injection Inject 0.5 mLs  (100 mcg total) into the skin every Saturday at 6 PM.  . docusate sodium (COLACE) 100 MG capsule Take 1 capsule (100 mg total) by mouth daily.  . finasteride (PROSCAR) 5 MG tablet TAKE ONE TABLET BY MOUTH DAILY  . fludrocortisone (FLORINEF) 0.1 MG tablet TAKE ONE TABLET BY MOUTH TWICE A DAY  . furosemide (LASIX) 20 MG tablet TAKE ONE TABLET BY MOUTH DAILY  . hydrALAZINE (APRESOLINE) 10 MG tablet Take 1.5 tablets (15 mg total) by mouth 3 (three) times daily.  . Multiple Vitamins-Minerals (DECUBI-VITE) CAPS Take 1 capsule by mouth daily. (Patient not taking: Reported on 08/23/2020)  . nitroGLYCERIN (NITROSTAT) 0.4 MG SL tablet Place 1 tablet (0.4 mg total) under the tongue every 5 (five) minutes x 3 doses as needed for chest pain.  . predniSONE (DELTASONE) 10 MG tablet Take 10 mg by mouth daily.   . sodium polystyrene (KAYEXALATE) 15 GM/60ML suspension Take 120 mLs (30 g total) by mouth 3 (three) times a week.  . tamsulosin (FLOMAX) 0.4 MG CAPS capsule TAKE TWO CAPSULES BY MOUTH DAILY  . triamcinolone ointment (KENALOG) 0.1 % Apply 1 application topically 2 (two) times daily.   Facility-Administered Encounter Medications as of 11/19/2020  Medication  . epoetin alfa (EPOGEN) injection 10,000 Units     Objective:  Wt Readings from Last 3 Encounters:  11/19/20 199 lb 12.8 oz (90.6 kg)  09/17/20 205 lb (93 kg)  09/12/20 211 lb 8 oz (95.9 kg)   BP Readings from Last 3 Encounters:  11/19/20 (!) 149/72  11/19/20 133/69  11/05/20 (!) 174/97  Goals Addressed              This Visit's Progress     Patient Stated   .  " I'm willing to consider [facility] or an assisted living facility." (pt-stated)        Timeframe:  Long-Range Goal Priority:  High Start Date:       11/19/20                      Expected End Date:       03/14/21            - follow-up on any referrals for help I am given - think ahead to make sure my need does not become an emergency        .  "I'm interested in getting  the Covid vaccine." (pt-stated)        CARE PLAN ENTRY (see longitudinal plan of care for additional care plan information)  Current Barriers:  . Care Coordination needs related to securing Covid vaccine appointment in a patient with NIDDM, HTN, HLD, CKD, A fib, s/p CVA with left sided spasticity - patient declined offer to have this CCM RN arrange for the vaccination at his home, he says he wants to go to a vaccination clinic- patient states he had not gotten his Covid vaccination or flu shot yet  Nurse Case Manager Clinical Goal(s):  [name] Over the next 30 days, patient will verbalize understanding of plan for CCM RN to work with him and his niece to arrange for 1st Covid vaccination  Interventions:  . Inter-disciplinary care team collaboration (see longitudinal plan of care) . Assessed status of Covid vaccination- offered again to provide Derrick Hendrix (patient's niece) with the phone number to schedule patient's first Covid vaccination  Patient Self Care Activities:  . Patient verbalizes understanding of plan to work with CCM RN and niece to get 1st Covid vaccine . Self administers medications as prescribed . Attends all scheduled provider appointments . Calls pharmacy for medication refills . Performs ADL's independently . Calls provider office for new concerns or questions . Unable to independently schedule 1st Covid vaccination appointment  Please see past updates related to this goal by clicking on the "Past Updates" button in the selected goal        Other   .  " I need a new foot doctor'        Timeframe:  Short-Term Goal Priority:  Medium Start Date:       11/19/20                      Expected End Date:       12/20/20                  - check feet daily for cuts, sores or redness - wash and dry feet carefully every day - wear comfortable, cotton socks - wear comfortable, well-fitting shoes -make and keep appointment with podiatrist         Patient Care Plan: Patient will  investigate [facility] and Assisted Living Facilities with [name].    Problem Identified: Patient needs assistance with ADLS and IADLS.   Priority: High  Onset Date: 11/19/2020    Goal: Self-Management Plan Developed   Start Date: 11/19/2020  Expected End Date: 03/14/2021  This Visit's Progress: On track  Priority: High  Note:   Current Barriers:  . Care Coordination needs related to alternate housing and/or community  assistance in a patient with NIDDM, HTN, HLD, CKD, A fib, s/p CVA with left sided spasticity - long discussion with patient during his clinic visit to address ADL/IADL deficits and to offer options for assistance; patient does not want to pursue personal care services application and does agree to investigate [facility]ACE and assisted living facilities . Unable to perform ADLs independently . Unable to perform IADLs independently  Nurse Case Manager Clinical Goal(s):  [name] Over the next 30-60 days, patient will work with CCM clinical social worker to investigate PACE and assisted living facilities  Interventions:  . Inter-disciplinary care team collaboration (see longitudinal plan of care) . - choices provided- gave patient [facility] brochure and information packet and explained services, also explained assisted living facilities- services they provide and payment . - collaboration with team encouraged . - decision-making supported . - difficulty of making life-long changes acknowledged . - health risks reviewed . - problem-solving facilitated . - questions answered . - resources needed to meet goals identified . Social Work referral for assistance with optional housing or community assistance to address IADL/ADL deficits  Patient Goals/Self-Care Activities Over the next 30-60 days, patient will:  - work with [employer] and follow up with niece to investigate PACE and assisted living facilities  Follow Up Plan: The care management team will reach out to the patient again over the next  7-30 days.       Patient Care Plan: Patient needs assistance with establishing new  podiatrist as he is unable to perform nail care    Problem Identified: Disease Progression (Diabetes, Type 2)   Priority: Medium  Onset Date: 11/19/2020  Note:   Current Barriers:  . Care Coordination needs related to foot care in a patient with NIDDM, HTN, HLD, CKD, A fib, s/p CVA with left sided spasticity  . Unable to independently secure new podiatrist- patient states he cannot cut his toenails but had a bad experience with a previous podiatrist so is seeking to establish with a new one  Nurse Case Manager Clinical Goal(s):  [name] Over the next 30 days, patient will meet with RN Care Manager to address establishing a new podiatrist  Interventions:  . Inter-disciplinary care team collaboration (see longitudinal plan of care) . Asked clinic provider for referral to podiatrist . At patient's request provided names of local podiatrist avoiding prior podiatrist to assist patient in securing appointment with new podiatrist  Prepare patient for referral for specialist care; podiatry  Ensure completion of annual comprehensive foot exam and dilated eye exam.    Implement additional individualized goals and interventions based on identified risk factors.   Patient Goals/Self-Care Activities Over the next 30 days, patient will:  -  make and keep appointment with new podiatrist to complete nail and foot care  Follow Up Plan: The care management team will reach out to the patient again over the next 30-60 days.         Plan:   The care management team will reach out to the patient again over the next 7-30 days.    Kelli Churn RN, CCM, [facility] RN Care Manager [phone]

## 2020-11-20 ENCOUNTER — Encounter: Payer: Self-pay | Admitting: *Deleted

## 2020-11-20 ENCOUNTER — Observation Stay (HOSPITAL_COMMUNITY)
Admission: EM | Admit: 2020-11-20 | Discharge: 2020-11-21 | Disposition: A | Discharge status: Home / Self Care | Payer: Medicare HMO | Attending: Internal Medicine | Admitting: Internal Medicine

## 2020-11-20 DIAGNOSIS — Z7984 Long term (current) use of oral hypoglycemic drugs: Secondary | ICD-10-CM | POA: Insufficient documentation

## 2020-11-20 DIAGNOSIS — E875 Hyperkalemia: Secondary | ICD-10-CM | POA: Diagnosis present

## 2020-11-20 DIAGNOSIS — E1122 Type 2 diabetes mellitus with diabetic chronic kidney disease: Secondary | ICD-10-CM | POA: Diagnosis not present

## 2020-11-20 DIAGNOSIS — Z87891 Personal history of nicotine dependence: Secondary | ICD-10-CM | POA: Insufficient documentation

## 2020-11-20 DIAGNOSIS — Z7901 Long term (current) use of anticoagulants: Secondary | ICD-10-CM | POA: Diagnosis not present

## 2020-11-20 DIAGNOSIS — I129 Hypertensive chronic kidney disease with stage 1 through stage 4 chronic kidney disease, or unspecified chronic kidney disease: Secondary | ICD-10-CM | POA: Insufficient documentation

## 2020-11-20 DIAGNOSIS — Z79899 Other long term (current) drug therapy: Secondary | ICD-10-CM | POA: Diagnosis not present

## 2020-11-20 DIAGNOSIS — N184 Chronic kidney disease, stage 4 (severe): Secondary | ICD-10-CM | POA: Diagnosis not present

## 2020-11-20 DIAGNOSIS — Z20822 Contact with and (suspected) exposure to covid-19: Secondary | ICD-10-CM | POA: Insufficient documentation

## 2020-11-20 DIAGNOSIS — R42 Dizziness and giddiness: Secondary | ICD-10-CM | POA: Diagnosis not present

## 2020-11-20 LAB — URINALYSIS, COMPLETE (UACMP) WITH MICROSCOPIC
Bilirubin Urine: NEGATIVE
Glucose, UA: 150 mg/dL — AB
Ketones, ur: NEGATIVE mg/dL
Leukocytes,Ua: NEGATIVE
Nitrite: NEGATIVE
Protein, ur: 100 mg/dL — AB
Specific Gravity, Urine: 1.013 (ref 1.005–1.030)
pH: 5 (ref 5.0–8.0)

## 2020-11-20 LAB — COMPREHENSIVE METABOLIC PANEL
ALT: 11 U/L (ref 0–44)
AST: 17 U/L (ref 15–41)
Albumin: 3.9 g/dL (ref 3.5–5.0)
Alkaline Phosphatase: 67 U/L (ref 38–126)
Anion gap: 13 (ref 5–15)
BUN: 78 mg/dL — ABNORMAL HIGH (ref 8–23)
CO2: 13 mmol/L — ABNORMAL LOW (ref 22–32)
Calcium: 9.3 mg/dL (ref 8.9–10.3)
Chloride: 116 mmol/L — ABNORMAL HIGH (ref 98–111)
Creatinine, Ser: 4.77 mg/dL — ABNORMAL HIGH (ref 0.61–1.24)
GFR, Estimated: 12 mL/min — ABNORMAL LOW (ref >60)
Glucose, Bld: 91 mg/dL (ref 70–99)
Potassium: 6.8 mmol/L (ref 3.5–5.1)
Sodium: 142 mmol/L (ref 135–145)
Total Bilirubin: 0.5 mg/dL (ref 0.3–1.2)
Total Protein: 7.4 g/dL (ref 6.5–8.1)

## 2020-11-20 LAB — BASIC METABOLIC PANEL
Anion gap: 11 (ref 5–15)
BUN: 74 mg/dL — ABNORMAL HIGH (ref 8–23)
CO2: 14 mmol/L — ABNORMAL LOW (ref 22–32)
Calcium: 8.9 mg/dL (ref 8.9–10.3)
Chloride: 117 mmol/L — ABNORMAL HIGH (ref 98–111)
Creatinine, Ser: 3.97 mg/dL — ABNORMAL HIGH (ref 0.61–1.24)
GFR, Estimated: 14 mL/min — ABNORMAL LOW (ref >60)
Glucose, Bld: 87 mg/dL (ref 70–99)
Potassium: 5.1 mmol/L (ref 3.5–5.1)
Sodium: 142 mmol/L (ref 135–145)

## 2020-11-20 LAB — CBC
HCT: 43.4 % (ref 39.0–52.0)
Hemoglobin: 13.3 g/dL (ref 13.0–17.0)
MCH: 30 pg (ref 26.0–34.0)
MCHC: 30.6 g/dL (ref 30.0–36.0)
MCV: 97.7 fL (ref 80.0–100.0)
Platelets: 235 10*3/uL (ref 150–400)
RBC: 4.44 MIL/uL (ref 4.22–5.81)
RDW: 16 % — ABNORMAL HIGH (ref 11.5–15.5)
WBC: 5 10*3/uL (ref 4.0–10.5)
nRBC: 0 % (ref 0.0–0.2)

## 2020-11-20 LAB — RESP PANEL BY RT-PCR (FLU A&B, COVID) ARPGX2
Influenza A by PCR: NEGATIVE
Influenza B by PCR: NEGATIVE
SARS Coronavirus 2 by RT PCR: NEGATIVE

## 2020-11-20 LAB — CBG MONITORING, ED: Glucose-Capillary: 109 mg/dL — ABNORMAL HIGH (ref 70–99)

## 2020-11-20 MED ORDER — CLOBETASOL PROPIONATE 0.05 % EX CREA
1.0000 "application " | TOPICAL_CREAM | Freq: Two times a day (BID) | CUTANEOUS | Status: DC
  Administered 2020-11-21: 1 via TOPICAL
  Filled 2020-11-20: qty 15

## 2020-11-20 MED ORDER — LACTATED RINGERS IV SOLN: INTRAVENOUS | Status: DC

## 2020-11-20 MED ORDER — INSULIN ASPART 100 UNIT/ML IV SOLN
5.0000 [IU] | Freq: Once | INTRAVENOUS | Status: AC
  Administered 2020-11-20: 5 [IU] via INTRAVENOUS

## 2020-11-20 MED ORDER — DEXTROSE 50 % IV SOLN
1.0000 | Freq: Once | INTRAVENOUS | Status: AC
  Administered 2020-11-20: 50 mL via INTRAVENOUS
  Filled 2020-11-20: qty 50

## 2020-11-20 MED ORDER — CALCITRIOL 0.25 MCG PO CAPS
0.2500 ug | ORAL_CAPSULE | Freq: Every day | ORAL | Status: DC
  Administered 2020-11-20 – 2020-11-21 (×2): 0.25 ug via ORAL
  Filled 2020-11-20 (×2): qty 1

## 2020-11-20 MED ORDER — AMLODIPINE BESYLATE 5 MG PO TABS
10.0000 mg | ORAL_TABLET | Freq: Every day | ORAL | Status: DC
  Administered 2020-11-20 – 2020-11-21 (×2): 10 mg via ORAL
  Filled 2020-11-20 (×2): qty 2

## 2020-11-20 MED ORDER — SODIUM BICARBONATE 8.4 % IV SOLN
Freq: Once | INTRAVENOUS | Status: AC
  Filled 2020-11-20: qty 100

## 2020-11-20 MED ORDER — ALBUTEROL SULFATE HFA 108 (90 BASE) MCG/ACT IN AERS
4.0000 | INHALATION_SPRAY | Freq: Once | RESPIRATORY_TRACT | Status: AC
  Administered 2020-11-20: 4 via RESPIRATORY_TRACT
  Filled 2020-11-20: qty 6.7

## 2020-11-20 MED ORDER — APIXABAN 2.5 MG PO TABS
2.5000 mg | ORAL_TABLET | Freq: Two times a day (BID) | ORAL | Status: DC
  Administered 2020-11-20 – 2020-11-21 (×2): 2.5 mg via ORAL
  Filled 2020-11-20 (×3): qty 1

## 2020-11-20 MED ORDER — DARBEPOETIN ALFA 100 MCG/0.5ML IJ SOSY: 100.0000 ug | PREFILLED_SYRINGE | INTRAMUSCULAR | Status: DC

## 2020-11-20 MED ORDER — BISACODYL 5 MG PO TBEC: 5.0000 mg | DELAYED_RELEASE_TABLET | Freq: Every day | ORAL | Status: DC | PRN

## 2020-11-20 MED ORDER — NITROGLYCERIN 0.4 MG SL SUBL: 0.4000 mg | SUBLINGUAL_TABLET | SUBLINGUAL | Status: DC | PRN

## 2020-11-20 MED ORDER — TAMSULOSIN HCL 0.4 MG PO CAPS
0.8000 mg | ORAL_CAPSULE | Freq: Every day | ORAL | Status: DC
  Administered 2020-11-20 – 2020-11-21 (×2): 0.8 mg via ORAL
  Filled 2020-11-20 (×2): qty 2

## 2020-11-20 MED ORDER — DOCUSATE SODIUM 100 MG PO CAPS
100.0000 mg | ORAL_CAPSULE | Freq: Every day | ORAL | Status: DC
  Administered 2020-11-20 – 2020-11-21 (×2): 100 mg via ORAL
  Filled 2020-11-20 (×2): qty 1

## 2020-11-20 MED ORDER — PREDNISONE 20 MG PO TABS
10.0000 mg | ORAL_TABLET | Freq: Every day | ORAL | Status: DC
  Administered 2020-11-20 – 2020-11-21 (×2): 10 mg via ORAL
  Filled 2020-11-20 (×2): qty 1

## 2020-11-20 MED ORDER — SODIUM POLYSTYRENE SULFONATE 15 GM/60ML PO SUSP
30.0000 g | Freq: Once | ORAL | Status: AC
  Administered 2020-11-20: 30 g via ORAL
  Filled 2020-11-20: qty 120

## 2020-11-20 MED ORDER — HYDRALAZINE HCL 10 MG PO TABS
15.0000 mg | ORAL_TABLET | Freq: Three times a day (TID) | ORAL | Status: DC
  Administered 2020-11-20 – 2020-11-21 (×3): 15 mg via ORAL
  Filled 2020-11-20 (×3): qty 2

## 2020-11-20 MED ORDER — ATORVASTATIN CALCIUM 80 MG PO TABS
80.0000 mg | ORAL_TABLET | Freq: Every day | ORAL | Status: DC
  Administered 2020-11-20 – 2020-11-21 (×2): 80 mg via ORAL
  Filled 2020-11-20 (×2): qty 1

## 2020-11-20 MED ORDER — FINASTERIDE 5 MG PO TABS
5.0000 mg | ORAL_TABLET | Freq: Every day | ORAL | Status: DC
  Administered 2020-11-20 – 2020-11-21 (×2): 5 mg via ORAL
  Filled 2020-11-20 (×2): qty 1

## 2020-11-20 MED ORDER — SODIUM ZIRCONIUM CYCLOSILICATE 10 G PO PACK
10.0000 g | PACK | Freq: Two times a day (BID) | ORAL | Status: DC
  Administered 2020-11-20 – 2020-11-21 (×2): 10 g via ORAL
  Filled 2020-11-20 (×2): qty 1

## 2020-11-20 MED ORDER — CAMPHOR-MENTHOL 0.5-0.5 % EX LOTN
1.0000 "application " | TOPICAL_LOTION | CUTANEOUS | Status: DC | PRN
  Filled 2020-11-20: qty 222

## 2020-11-20 MED ORDER — FLUDROCORTISONE ACETATE 0.1 MG PO TABS
0.1000 mg | ORAL_TABLET | Freq: Every day | ORAL | Status: DC
  Administered 2020-11-20: 0.1 mg via ORAL
  Filled 2020-11-20 (×2): qty 1

## 2020-11-20 MED ORDER — TRIAMCINOLONE ACETONIDE 0.1 % EX OINT
1.0000 "application " | TOPICAL_OINTMENT | Freq: Two times a day (BID) | CUTANEOUS | Status: DC
  Administered 2020-11-21: 1 via TOPICAL
  Filled 2020-11-20: qty 15

## 2020-11-20 NOTE — Progress Notes (Signed)

## 2020-11-20 NOTE — Progress Notes (Signed)
Things That May Be Affecting Your Health:  Alcohol  Hearing loss  Pain    Depression  Home Safety  Sexual Health   Diabetes  Lack of physical activity  Stress   Difficulty with daily activities  Loneliness  Tiredness   Drug use  Medicines  Tobacco use   Falls  Motor Vehicle Safety  Weight   Food choices  Oral Health  Other    YOUR PERSONALIZED HEALTH PLAN : 1. Schedule your next subsequent Medicare Wellness visit in one year 2. Attend all of your regular appointments to address your medical issues 3. Complete the preventative screenings and services   Annual Wellness Visit   Medicare Covered Preventative Screenings and [place] Men and Women Who How Often Need? Date of Last Service Action  Abdominal Aortic Aneurysm Adults with AAA risk factors Once     Alcohol Misuse and Counseling All Adults Screening once a year if no alcohol misuse. Counseling up to 4 face to face sessions.     Bone Density Measurement  Adults at risk for osteoporosis Once every 2 yrs     Lipid Panel Z13.6 All adults without CV disease Once every 5 yrs     Colorectal Cancer   Stool sample or  Colonoscopy All adults 59 and older   Once every year  Every 10 years     Depression All Adults Once a year  Today   Diabetes Screening Blood glucose, post glucose load, or GTT Z13.1  All adults at risk  Pre-diabetics  Once per year  Twice per year     Diabetes  Self-Management Training All adults Diabetics 10 hrs first year; 2 hours subsequent years. Requires Copay     Glaucoma  Diabetics  Family history of glaucoma  African Americans 22 yrs +  Hispanic Americans 19 yrs + Annually - requires coppay     Hepatitis C Z72.89 or F19.20  High Risk for HCV  Born between 1945 and 1965  Annually  Once     HIV Z11.4 All adults based on risk  Annually btw ages 75 & 33 regardless of risk  Annually > 65 yrs if at increased risk     Lung Cancer Screening Asymptomatic adults aged  24-77 with 30 pack yr history and current smoker OR quit within the last 15 yrs Annually Must have counseling and shared decision making documentation before first screen     Medical Nutrition Therapy Adults with   Diabetes  Renal disease  Kidney transplant within past 3 yrs 3 hours first year; 2 hours subsequent years YES    Obesity and Counseling All adults Screening once a year Counseling if BMI 30 or higher  Today   Tobacco Use Counseling Adults who use tobacco  Up to 8 visits in one year     Vaccines Z23  Hepatitis B  Influenza   Pneumonia  Adults   Once  Once every flu season  Two different vaccines separated by one year     Next Annual Wellness Visit People with Medicare Every year  Today     Services & Screenings Women Who How Often Need  Date of Last Service Action  Mammogram  Z12.31 Women over 66 One baseline ages 33-39. Annually ager 40 yrs+     Pap tests All women Annually if high risk. Every 2 yrs for normal risk women     Screening for cervical cancer with   Pap (Z01.419 nl or Z01.411abnl) &  HPV Z11.51 Women aged 11 to 88 Once every 5 yrs     Screening pelvic and breast exams All women Annually if high risk. Every 2 yrs for normal risk women     Sexually Transmitted Diseases  Chlamydia  Gonorrhea  Syphilis All at risk adults Annually for non pregnant females at increased risk         [place] Men Who How Ofter Need  Date of Last Service Action  Prostate Cancer - DRE & PSA Men over 50 Annually.  DRE might require a copay.     Sexually Transmitted Diseases  Syphilis All at risk adults Annually for men at increased risk

## 2020-11-20 NOTE — H&P (Signed)
Date: 11/20/2020               Patient Name:  Derrick Hendrix. MRN: [id number]  DOB: [birth year] Age / Sex: 79 y.o., male   PCP: [name], MD         Medical Service: Internal Medicine Teaching Service         Attending Physician: Dr. Dorie Rank, MD    First Contact: Dr. Linwood Dibbles, MD Pager: [phone]  Second Contact: Dr. Marianna Payment, DO Pager: [phone]       After Hours (After 5p/  First Contact Pager: [phone]  weekends / holidays): Second Contact Pager: [phone]   Chief Complaint: Abnormal lab  History of Present Illness: 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 Patient has had issues with recurrent hyperkalemia. Patient was seen at the the internal medicine clinic 12/06 for routine appointment. Patient had outpatient lab test and BMP showed a K+ of 6. Patient was called and told to take [facility] and come to the ED due to worsening hyperkalemia. Patient states he felt too tired to come to the ED last night. States he has been taking all his medications. He tries to watch his diet to decrease his K+ intake. He endoese dry mouth, abdominal fullness and cramping all over but denies any nausea vomiting, diarrhea, abd pain, SOB palpitations, CP or headaches. States he moves his bowel every 3-4 days at baseline and often needs to take a laxative.   In the ED, CMP showed K+ of 6.8, bicarb of 13, Cr of 4.77 BUN of 78.  EKG showed sinus rhythm with premature atrial complexes similar to previous EKG.  Patient was given 30 g of Kayexalate 5 units of insulin, D5, sodium bicarb and albuterol inhaler.  Meds:  Current Meds  Medication Sig  . amLODipine (NORVASC) 10 MG tablet TAKE ONE TABLET BY MOUTH DAILY  . apixaban (ELIQUIS) 2.5 MG TABS tablet Take 1 tablet (2.5 mg total) by mouth 2  (two) times daily.  [name] atorvastatin (LIPITOR) 80 MG tablet TAKE ONE TABLET BY MOUTH DAILY (Patient taking differently: Take 80 mg by mouth daily. )  . BISACODYL 5 MG EC tablet TAKE 1 TABLET (5 MG TOTAL) BY MOUTH DAILY AS NEEDED FOR MODERATE CONSTIPATION. (Patient taking differently: Take 5 mg by mouth daily as needed for mild constipation. )  . Blood Pressure Monitoring (BLOOD PRESSURE KIT) DEVI Please check blood pressure twice a day. In the morning and evening. Please record numbers on a sheet of paper. Blood pressure parameters are 120-150/80-90  . calcitRIOL (ROCALTROL) 0.25 MCG capsule Take 0.25 mcg by mouth daily.  . camphor-menthol (SARNA) lotion Apply topically as needed for itching. (Patient taking differently: Apply 1 application topically as needed for itching. )  . clobetasol cream (TEMOVATE) 0.05 % APPLY TOPICALLY TWO (TWO) TIMES DAILY. (Patient taking differently: Apply 1 application topically 2 (two) times daily. )  . Darbepoetin Alfa (ARANESP) 100 MCG/0.5ML SOSY injection Inject 0.5 mLs (100 mcg total) into the skin every Saturday at 6 PM.  . docusate sodium (COLACE) 100 MG capsule Take 1 capsule (100 mg total) by mouth daily.  . finasteride (PROSCAR) 5 MG tablet TAKE ONE TABLET BY MOUTH DAILY  . fludrocortisone (FLORINEF) 0.1 MG tablet TAKE ONE TABLET BY MOUTH  TWICE A DAY (Patient taking differently: Take 0.1 mg by mouth 2 (two) times daily. )  . furosemide (LASIX) 20 MG tablet TAKE ONE TABLET BY MOUTH DAILY  . hydrALAZINE (APRESOLINE) 10 MG tablet Take 1.5 tablets (15 mg total) by mouth 3 (three) times daily.  . nitroGLYCERIN (NITROSTAT) 0.4 MG SL tablet Place 1 tablet (0.4 mg total) under the tongue every 5 (five) minutes x 3 doses as needed for chest pain.  . predniSONE (DELTASONE) 10 MG tablet Take 10 mg by mouth daily.   . tamsulosin (FLOMAX) 0.4 MG CAPS capsule TAKE TWO CAPSULES BY MOUTH DAILY (Patient taking differently: Take 0.8 mg by mouth daily. )  . triamcinolone ointment  (KENALOG) 0.1 % Apply 1 application topically 2 (two) times daily.  . VELTASSA 8.4 g packet Take 1 packet by mouth daily.     Allergies: Allergies as of 11/20/2020 - Review Complete 11/20/2020  Allergen Reaction Noted  . Aspirin Other (See Comments) 08/24/2014   Past Medical History:  Diagnosis Date  . Abscess of foot without toes, left 08/28/2017  . Chronic kidney disease (CKD), stage III (moderate) (HCC)   . Constipation 11/07/2014  . Daily headache    "lately" (04/17/2016)  . Depression   . Dizziness 03/07/2016  . Headache 06/26/2014  . Heart murmur   . Hyperkalemia 10/05/2018  . Hyperlipemia 11/11/2016  . Hypertension   . Neuropathic pain 02/06/2015  . Osteomyelitis ([place])   . PAD (peripheral artery disease) ([place])   . Paronychia of great toe, right   . Preop cardiovascular exam   . Right foot pain 02/09/2017  . Stable angina ([place]) 06/27/2014  . Stroke ([place]) 03/2016   hx of PAD; j"ust lots of headaches since" (04/17/2016)  . Vitamin B12 deficiency 06/27/2014  . Weight loss, unintentional 06/27/2014   Family History  Problem Relation Age of Onset  . Hypertension Mother        died age 12  . Diabetes Mother     Social History: Patient has a 55-pack-year history of tobacco use.  Denies any current tobacco use. Prior history of alcohol use; quit 7-8 years ago; was drinking 1/5th per day; unclear timeline for how long he was drinking. Denies illicit drug use   Review of Systems: A complete ROS was negative except as per HPI.   Physical Exam: Blood pressure (!) 164/85, pulse 88, temperature 97.9 F (36.6 C), temperature source Oral, resp. rate 15, SpO2 97 %.  General: Pleasant, well-appearing elderly man laying in bed. No acute distress. Head: Normocephalic. Atraumatic. CV: RRR. No murmurs, rubs, or gallops. No LE edema Pulmonary: Lungs CTAB. Normal effort. No wheezing or rales. Abdominal: Soft, nontender, nondistended. Normal bowel sounds. Extremities: Palpable pulses. Normal  ROM. Skin: Warm and dry. Diffused dark plaques consistent with psoriasis Neuro: A&Ox3. Moves all extremities. Hemiballismus on the left side. Normal sensation. Strength 5/5 in all extremities. Psych: Normal mood and affect  EKG: personally reviewed my interpretation is sinus rhythm with premature atrial complexes similar to previous EKG.  Assessment & Plan by Problem: Active Problems:   Hyperkalemia  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  #Hypokalemia  #Acute On Chronic Renal Failure Patient w/ CKD stage IV with worsening renal function. Followed by nephrology and vascular surgery, however there is no  plan for dialysis at this time due to his psoriasis flare and hemiballismus. Patient was found to have a potassium of 6.0 yesterday at the Internal Medicine Clinic. Sent to the ED for further evaluation and admission for his hyperkalemia. In the ED, CMP showed K+ of 6.8, bicarb of 13, Cr of 4.77 BUN of 78. Pt received Kayexalate, sodium bicarb, insulin, D50 and albuterol inhaler. EKG did not show peaked T waves. Will follow up repeat CMP --Start Lokelma 10 g packet BID --Holding home Lasix as patient appears dry on exam. --LR IV 100 mL/hr x10 hours --Darbepoetin alfa injection 100 mcg every saturday --Tele --Nephro consulted by ED  --Continue with daily CMPS.  #Afib on Eliquis Patient with sinus rhythm on EKG today. Will put on monitor and watch closely. --Eliquis 2.5 mg tab BID --Tele  #Hypertension Patient with history of uncontrolled hypertension, BP of 175/108 today. --Amlodipine 10 mg tab daily --Hydralazine 15 mg TID --daily vitals  #Constipation Patient states she has a history of constipation.  He has bowel movement every 3 to 4 days.  He often needs a laxative to help  move his bowel. --Dulcolax 5 mg daily PRN --Colace 100 mg daily  #Type 2 diabetes mellitus Last A1C of 5.5, 9 months ago. Patient follows lifestyle modifications. Current CBG is 109. Will continue to monitor --CBG monitoring  #Benign prostatic hyperplasia Patient with history of BPH.  Denies any dysuria. --Tamulosin 0.4 mg daily --Finasteride 5 mg daily  #Hyperlipidemia Stable chronic.  Last LDL of 95 four years ago.  --Atorvastatin 80 mg ordered  #History of cerebellar infarct with left-sided hemiballismus Patient has residual left sided hemiballismus since infarct which is evident on exam. --Chronic stable  #Psoarisis Diffuse macular rash consistent with diffuse psoriasis. Patient notes rash is pruritic in nature. He is followed by dermatology. Can consider hydroxyzine if patient complains of itching. --Prednisone 10 mg daily --Kenalog 0.1% cream BID --Temovate 0.05% topical BID --Sarna lotion PRN for itching   CODE STATUS: Full Code DIET: renal diet PPx: Eliquis 2.5 mg BID  Dispo: Admit patient to Observation with expected length of stay less than 2 midnights.  Signed: Lacinda Axon, MD 11/20/2020, 2:36 PM  Pager: [phone] Internal Medicine Teaching Service After 5pm on weekdays and 1pm on weekends: On Call pager: [phone]

## 2020-11-20 NOTE — ED Provider Notes (Signed)
[facility] EMERGENCY DEPARTMENT Provider Note   CSN: [id number] Arrival date & time: 11/20/20  0935     History Chief Complaint  Patient presents with  . Abnormal Lab    Derrick Hendrix. is a 79 y.o. male.  HPI   Patient presents to the ED for evaluation of worsening hyperkalemia.  Patient has a history of chronic kidney disease.  He is not on dialysis.  Patient has had issues with recurrent hyperkalemia.  Patient states he has been taking his medications.  He tries to watch his diet.  Patient went to the internal medicine clinic yesterday for routine appointment.  Patient had outpatient laboratory tests.  Patient was called in told to return to the ED because of worsening hyperkalemia.  Patient denies any nausea vomiting.  No diarrhea.  States in fact he has more issues with constipation.  Past Medical History:  Diagnosis Date  . Abscess of foot without toes, left 08/28/2017  . Chronic kidney disease (CKD), stage III (moderate) (HCC)   . Constipation 11/07/2014  . Daily headache    "lately" (04/17/2016)  . Depression   . Dizziness 03/07/2016  . Headache 06/26/2014  . Heart murmur   . Hyperkalemia 10/05/2018  . Hyperlipemia 11/11/2016  . Hypertension   . Neuropathic pain 02/06/2015  . Osteomyelitis ([place])   . PAD (peripheral artery disease) ([place])   . Paronychia of great toe, right   . Preop cardiovascular exam   . Right foot pain 02/09/2017  . Stable angina ([place]) 06/27/2014  . Stroke ([place]) 03/2016   hx of PAD; j"ust lots of headaches since" (04/17/2016)  . Vitamin B12 deficiency 06/27/2014  . Weight loss, unintentional 06/27/2014    Patient Active Problem List   Diagnosis Date Noted  . Dysphagia 08/23/2020  . Psoriasis 04/23/2020  . Acute on chronic renal failure ([place]) 03/03/2020  . Acute kidney injury superimposed on chronic kidney disease ([place])   . Normal anion gap metabolic acidosis   . Epistaxis 02/20/2020  . Atrial fibrillation ([place]) 10/14/2018  .  Type 2 diabetes mellitus ([place]) 09/04/2017  . Hemiballism 08/31/2017  . Ischemic pain of foot, right   . Severe peripheral arterial disease ([place]) 02/27/2017  . Cerebellar infarct ([place]) 05/06/2016  . Benign prostatic hyperplasia with urinary obstruction   . Orthostatic hypotension 04/04/2016  . Urinary retention 03/20/2016  . Cerebrovascular accident (CVA) due to occlusion of left posterior cerebral artery ([place])   . Dizziness 03/07/2016  . Essential hypertension   . Normocytic anemia 10/20/2014  . Hyporeninemic hypoaldosteronism ([place]) 08/24/2014  . Hyperkalemia 08/01/2014  . Healthcare maintenance 07/13/2014  . Atherosclerosis of leg with intermittent claudication, R 06/29/2014  . Current smoker 06/27/2014  . Alcohol use ([place]) 06/27/2014  . CKD (chronic kidney disease), stage IV ([place]) 06/26/2014    Past Surgical History:  Procedure Laterality Date  . ABDOMINAL AORTOGRAM W/LOWER EXTREMITY N/A 03/02/2017   Procedure: Abdominal Aortogram w/Lower Extremity;  Surgeon: Conrad Riviera, MD;  Location: [facility];  Service: Cardiovascular;  Laterality: N/A;  . EP IMPLANTABLE DEVICE N/A 03/04/2016   Procedure: Loop Recorder Insertion;  Surgeon: Thompson Grayer, MD;  Location: [facility];  Service: Cardiovascular;  Laterality: N/A;  . PERIPHERAL VASCULAR INTERVENTION Right 03/06/2017   Procedure: Peripheral Vascular Intervention;  Surgeon: Elam Dutch, MD;  Location: [facility];  Service: Cardiovascular;  Laterality: Right;  SFA       Family History  Problem Relation Age of Onset  .  Hypertension Mother        died age 27  . Diabetes Mother     Social History   Tobacco Use  . Smoking status: Former Smoker    Years: 66.00    Types: Cigarettes    Start date: 06/23/2017  . Smokeless tobacco: Never Used  Vaping Use  . Vaping Use: Never used  Substance Use Topics  . Alcohol use: No    Alcohol/week: 0.0 standard drinks    Comment: 04/17/2016 "nothing in the last 3-4  years"  . Drug use: No    Home Medications Prior to Admission medications   Medication Sig Start Date End Date Taking? Authorizing Provider  BISACODYL 5 MG EC tablet TAKE 1 TABLET (5 MG TOTAL) BY MOUTH DAILY AS NEEDED FOR MODERATE CONSTIPATION. 11/13/20   Katsadouros, Vasilios, MD  clobetasol cream (TEMOVATE) 0.05 % APPLY TOPICALLY TWO (TWO) TIMES DAILY. 11/13/20   Katsadouros, Vasilios, MD  amLODipine (NORVASC) 10 MG tablet TAKE ONE TABLET BY MOUTH DAILY 10/17/20   Jose Persia, MD  apixaban (ELIQUIS) 2.5 MG TABS tablet Take 1 tablet (2.5 mg total) by mouth 2 (two) times daily. 09/21/20 12/20/20  [name], MD  atorvastatin (LIPITOR) 80 MG tablet TAKE ONE TABLET BY MOUTH DAILY 10/17/20   Jose Persia, MD  Blood Pressure Monitoring (BLOOD PRESSURE KIT) DEVI Please check blood pressure twice a day. In the morning and evening. Please record numbers on a sheet of paper. Blood pressure parameters are 120-150/80-90 05/15/20   [name], MD  calcitRIOL (ROCALTROL) 0.25 MCG capsule Take 0.25 mcg by mouth daily. 07/12/20   [provider]  camphor-menthol Timoteo Ace) lotion Apply topically as needed for itching. 08/26/20   Masoudi, Dorthula Rue, MD  clonazePAM (KLONOPIN) 0.25 MG disintegrating tablet TAKE ONE TABLET BY MOUTH THREE TIMES A DAY 10/03/20   Mosetta Anis, MD  Darbepoetin Alfa (ARANESP) 100 MCG/0.5ML SOSY injection Inject 0.5 mLs (100 mcg total) into the skin every Saturday at 6 PM. 09/01/20   Masoudi, Dorthula Rue, MD  docusate sodium (COLACE) 100 MG capsule Take 1 capsule (100 mg total) by mouth daily. 06/02/18   Allred, Jeneen Rinks, MD  finasteride (PROSCAR) 5 MG tablet TAKE ONE TABLET BY MOUTH DAILY 10/17/20   Jose Persia, MD  fludrocortisone (FLORINEF) 0.1 MG tablet TAKE ONE TABLET BY MOUTH TWICE A DAY 09/20/20   Mosetta Anis, MD  furosemide (LASIX) 20 MG tablet TAKE ONE TABLET BY MOUTH DAILY 10/17/20   Jose Persia, MD  hydrALAZINE (APRESOLINE) 10 MG tablet Take 1.5 tablets (15  mg total) by mouth 3 (three) times daily. 09/12/20 01/10/21  [name], MD  Multiple Vitamins-Minerals (DECUBI-VITE) CAPS Take 1 capsule by mouth daily. Patient not taking: Reported on 08/23/2020    [provider]  nitroGLYCERIN (NITROSTAT) 0.4 MG SL tablet Place 1 tablet (0.4 mg total) under the tongue every 5 (five) minutes x 3 doses as needed for chest pain. 11/29/14   Luan Moore, MD  predniSONE (DELTASONE) 10 MG tablet Take 10 mg by mouth daily.  05/22/20   [provider]  sodium polystyrene (KAYEXALATE) 15 GM/60ML suspension Take 120 mLs (30 g total) by mouth 3 (three) times a week. 08/27/20   Masoudi, Dorthula Rue, MD  tamsulosin (FLOMAX) 0.4 MG CAPS capsule TAKE TWO CAPSULES BY MOUTH DAILY 11/01/20   Riesa Pope, MD  triamcinolone ointment (KENALOG) 0.1 % Apply 1 application topically 2 (two) times daily. 09/12/20   [name], MD    Allergies    Aspirin  Review of Systems   Review of Systems  All other systems reviewed and are negative.   Physical Exam Updated Vital Signs BP (!) 163/93   Pulse 74   Temp 97.9 F (36.6 C) (Oral)   Resp 14   SpO2 100%   Physical Exam Vitals and nursing note reviewed.  Constitutional:      General: He is not in acute distress.    Appearance: He is well-developed.  HENT:     Head: Normocephalic and atraumatic.     Right Ear: External ear normal.     Left Ear: External ear normal.  Eyes:     General: No scleral icterus.       Right eye: No discharge.        Left eye: No discharge.     Conjunctiva/sclera: Conjunctivae normal.  Neck:     Trachea: No tracheal deviation.  Cardiovascular:     Rate and Rhythm: Normal rate and regular rhythm.  Pulmonary:     Effort: Pulmonary effort is normal. No respiratory distress.     Breath sounds: Normal breath sounds. No stridor. No wheezing or rales.  Abdominal:     General: Bowel sounds are normal. There is no distension.     Palpations: Abdomen is soft.      Tenderness: There is no abdominal tenderness. There is no guarding or rebound.  Musculoskeletal:        General: No tenderness.     Cervical back: Neck supple.  Skin:    General: Skin is warm and dry.     Findings: No rash.  Neurological:     Mental Status: He is alert.     Cranial Nerves: No cranial nerve deficit (no facial droop, extraocular movements intact, no slurred speech).     Sensory: No sensory deficit.     Motor: No seizure activity.     Coordination: Coordination abnormal.     Comments: Hemiballismus left side     ED Results / Procedures / Treatments   Labs (all labs ordered are listed, but only abnormal results are displayed) Labs Reviewed  CBC - Abnormal; Notable for the following components:      Result Value   RDW 16.0 (*)    All other components within normal limits  COMPREHENSIVE METABOLIC PANEL - Abnormal; Notable for the following components:   Potassium 6.8 (*)    Chloride 116 (*)    CO2 13 (*)    BUN 78 (*)    Creatinine, Ser 4.77 (*)    GFR, Estimated 12 (*)    All other components within normal limits  RESP PANEL BY RT-PCR (FLU A&B, COVID) [id number]    EKG EKG Interpretation  Date/Time:  Tuesday November 20 2020 10:34:57 EST Ventricular Rate:  88 PR Interval:  152 QRS Duration: 100 QT Interval:  352 QTC Calculation: 425 R Axis:   74 Text Interpretation: Sinus rhythm with Premature atrial complexes Minimal voltage criteria for LVH, may be normal variant ( Sokolow-Lyon ) Borderline ECG Poor data quality No significant change since last tracing Confirmed by Dorie Rank [phone]) on 11/20/2020 12:35:32 PM   Radiology No results found.  Procedures .Critical Care Performed by: Dorie Rank, MD Authorized by: Dorie Rank, MD   Critical care provider statement:    Critical care time (minutes):  45   Critical care was time spent personally by me on the following activities:  Discussions with consultants, evaluation of patient's response to treatment,  examination of patient, ordering and performing  treatments and interventions, ordering and review of laboratory studies, ordering and review of radiographic studies, pulse oximetry, re-evaluation of patient's condition, obtaining history from patient or surrogate and review of old charts   (including critical care time)  Medications Ordered in ED Medications  albuterol (VENTOLIN HFA) 108 (90 Base) MCG/ACT inhaler 4 puff (has no administration in time range)  insulin aspart (novoLOG) injection 5 Units (has no administration in time range)    And  dextrose 50 % solution 50 mL (has no administration in time range)  sodium polystyrene (KAYEXALATE) 15 GM/60ML suspension 30 g (has no administration in time range)    ED Course  I have reviewed the triage vital signs and the nursing notes.  Pertinent labs & imaging results that were available during my care of the patient were reviewed by me and considered in my medical decision making (see chart for details).    MDM Rules/Calculators/A&P                          Patient presents ED for evaluation of worsening hyperkalemia.  Patient does have history of chronic kidney disease.  He is not on dialysis.  Patient's creatinine has been increasing.  Now 4.77 and 78.  Patient also has worsening hyperkalemia with his potassium elevated yesterday at 6 and today at 6.8.  Bicarb is also decreased at 13.  No definite EKG changes.  Patient will be treated with sodium bicarb, insulin dextrose, and a potassium binding agent.  Previously the patient was not felt to be a candidate for dialysis.  I will consult with the internal medicine service for admission.    Final Clinical Impression(s) / ED Diagnoses Final diagnoses:  None    Rx / DC Orders ED Discharge Orders    None       Dorie Rank, MD 11/20/20 1425

## 2020-11-20 NOTE — ED Triage Notes (Signed)
Pt was sent here by IM for further eval/tx of AKI (creatinine 4.87) with hyperkalemia (6.0) present on blood work drawn yesterday.

## 2020-11-21 DIAGNOSIS — E875 Hyperkalemia: Secondary | ICD-10-CM | POA: Diagnosis not present

## 2020-11-21 LAB — CBC
HCT: 39.4 % (ref 39.0–52.0)
Hemoglobin: 11.9 g/dL — ABNORMAL LOW (ref 13.0–17.0)
MCH: 29 pg (ref 26.0–34.0)
MCHC: 30.2 g/dL (ref 30.0–36.0)
MCV: 96.1 fL (ref 80.0–100.0)
Platelets: 249 10*3/uL (ref 150–400)
RBC: 4.1 MIL/uL — ABNORMAL LOW (ref 4.22–5.81)
RDW: 15.8 % — ABNORMAL HIGH (ref 11.5–15.5)
WBC: 5.8 10*3/uL (ref 4.0–10.5)
nRBC: 0 % (ref 0.0–0.2)

## 2020-11-21 LAB — BASIC METABOLIC PANEL
Anion gap: 11 (ref 5–15)
BUN: 68 mg/dL — ABNORMAL HIGH (ref 8–23)
CO2: 16 mmol/L — ABNORMAL LOW (ref 22–32)
Calcium: 8.6 mg/dL — ABNORMAL LOW (ref 8.9–10.3)
Chloride: 115 mmol/L — ABNORMAL HIGH (ref 98–111)
Creatinine, Ser: 3.62 mg/dL — ABNORMAL HIGH (ref 0.61–1.24)
GFR, Estimated: 16 mL/min — ABNORMAL LOW (ref >60)
Glucose, Bld: 133 mg/dL — ABNORMAL HIGH (ref 70–99)
Potassium: 4.8 mmol/L (ref 3.5–5.1)
Sodium: 142 mmol/L (ref 135–145)

## 2020-11-21 MED ORDER — SODIUM BICARBONATE 650 MG PO TABS: 1300.0000 mg | ORAL_TABLET | Freq: Two times a day (BID) | ORAL | 1 refills | Status: DC

## 2020-11-21 MED ORDER — SODIUM BICARBONATE 650 MG PO TABS
1300.0000 mg | ORAL_TABLET | Freq: Two times a day (BID) | ORAL | Status: DC
  Administered 2020-11-21: 1300 mg via ORAL
  Filled 2020-11-21 (×3): qty 2

## 2020-11-21 MED ORDER — SODIUM BICARBONATE 650 MG PO TABS
650.0000 mg | ORAL_TABLET | Freq: Two times a day (BID) | ORAL | Status: DC
  Filled 2020-11-21: qty 1

## 2020-11-21 MED ORDER — FUROSEMIDE 20 MG PO TABS
40.0000 mg | ORAL_TABLET | Freq: Two times a day (BID) | ORAL | Status: DC
  Administered 2020-11-21: 40 mg via ORAL
  Filled 2020-11-21: qty 2

## 2020-11-21 MED ORDER — FUROSEMIDE 40 MG PO TABS: 40.0000 mg | ORAL_TABLET | Freq: Two times a day (BID) | ORAL | 1 refills | Status: DC

## 2020-11-21 NOTE — Hospital Course (Addendum)
Hyperkalemia  Acute On Chronic Renal Failure Patient w/ CKD stage IV with worsening renal function. Followed by nephrology and vascular surgery, however there is no plan for dialysis at this time due to his psoriasis flare and hemiballismus. He presented for an evaluation for hyperkalemia (K+ 6.0). In the ED, CMP showed K+ of 6.8, bicarb of 13, Cr of 4.77 BUN of 78. Pt received Kayexalate, sodium bicarb, insulin, D50 and albuterol inhaler. EKG did not show peaked T waves. BMP the next day showed K+ of 4.8, bicarb of 16, Cr of 3.62 BUN of 68. Patient's cramping had resolved at day of discharge. At discharge, he was started on Sodium bicarb 1300 mg BID, discontinued Florinef and increased lasix from 20 mg to 40 mg daily. He will follow up with [facility] for lab check.    Afib on Eliquis Patient had sinus rhythm on EKG. We continued his Eliquis 2.5 mg tab BID.   Hypertension Patient with history of uncontrolled hypertension. We continued his Amlodipine 10 mg tab daily and Hydralazine 15 mg TID. BP improved to 150/75 at day of discharge.   Constipation Patient reported a history of constipation.  He has bowel movement every 3 to 4 days. He often needs a laxative to help move his bowel. We put him on Dulcolax 5 mg daily PRN and Colace 100 mg daily.    Type 2 diabetes mellitus Last A1C of 5.5, 9 months ago. Patient follows lifestyle modifications. Blood sugar was 133 on BMP.    Benign prostatic hyperplasia Patient with history of BPH. Denied any dysuria. We continued his home Tamulosin 0.4 mg daily and Finasteride 5 mg daily.    Hyperlipidemia Stable chronic. Last LDL of 95 four years ago. Atorvastatin 80 mg was continued.   Psoarisis Diffuse macular rash consistent with diffuse psoriasis. Patient notes rash is pruritic in nature. He is followed by dermatology. We continued his home meds.

## 2020-11-21 NOTE — ED Notes (Signed)
Pt awaiting niece for discharge transportation, niece called and vm left.  Pt aware.

## 2020-11-21 NOTE — ED Notes (Signed)
Breakfast ordered 

## 2020-11-21 NOTE — Discharge Summary (Addendum)
Name: Derrick Hendrix. MRN: [id number] DOB: 1941-06-26 79 y.o. PCP: [name], MD  Date of Admission: 11/20/2020 10:17 AM Date of Discharge:  11/21/2020 Attending Physician: Dr. Angelia Mould  Discharge Diagnosis: Principal Problem:   Hyperkalemia    Discharge Medications: Allergies as of 11/21/2020       Reactions   Aspirin Other (See Comments)   Nosebleeds         Medication List     STOP taking these medications    clonazePAM 0.25 MG disintegrating tablet Commonly known as: KLONOPIN   fludrocortisone 0.1 MG tablet Commonly known as: FLORINEF   sodium polystyrene 15 GM/60ML suspension Commonly known as: KAYEXALATE       TAKE these medications    amLODipine 10 MG tablet Commonly known as: NORVASC TAKE ONE TABLET BY MOUTH DAILY   apixaban 2.5 MG Tabs tablet Commonly known as: Eliquis Take 1 tablet (2.5 mg total) by mouth 2 (two) times daily.   atorvastatin 80 MG tablet Commonly known as: LIPITOR TAKE ONE TABLET BY MOUTH DAILY   bisacodyl 5 MG EC tablet Generic drug: bisacodyl TAKE 1 TABLET (5 MG TOTAL) BY MOUTH DAILY AS NEEDED FOR MODERATE CONSTIPATION. What changed: See the new instructions.   Blood Pressure Kit Devi Please check blood pressure twice a day. In the morning and evening. Please record numbers on a sheet of paper. Blood pressure parameters are 120-150/80-90   calcitRIOL 0.25 MCG capsule Commonly known as: ROCALTROL Take 0.25 mcg by mouth daily.   camphor-menthol lotion Commonly known as: SARNA Apply topically as needed for itching. What changed: how much to take   clobetasol cream 0.05 % Commonly known as: TEMOVATE APPLY TOPICALLY TWO (TWO) TIMES DAILY. What changed: See the new instructions.   Darbepoetin Alfa 100 MCG/0.5ML Sosy injection Commonly known as: ARANESP Inject 0.5 mLs (100 mcg total) into the skin every Saturday at 6 PM.   docusate sodium 100 MG capsule Commonly known as: Colace Take 1 capsule (100 mg  total) by mouth daily.   finasteride 5 MG tablet Commonly known as: PROSCAR TAKE ONE TABLET BY MOUTH DAILY   furosemide 40 MG tablet Commonly known as: LASIX Take 1 tablet (40 mg total) by mouth 2 (two) times daily. What changed:  medication strength how much to take when to take this   hydrALAZINE 10 MG tablet Commonly known as: APRESOLINE Take 1.5 tablets (15 mg total) by mouth 3 (three) times daily.   nitroGLYCERIN 0.4 MG SL tablet Commonly known as: NITROSTAT Place 1 tablet (0.4 mg total) under the tongue every 5 (five) minutes x 3 doses as needed for chest pain.   predniSONE 10 MG tablet Commonly known as: DELTASONE Take 10 mg by mouth daily.   sodium bicarbonate 650 MG tablet Take 2 tablets (1,300 mg total) by mouth 2 (two) times daily.   tamsulosin 0.4 MG Caps capsule Commonly known as: FLOMAX TAKE TWO CAPSULES BY MOUTH DAILY   triamcinolone ointment 0.1 % Commonly known as: KENALOG Apply 1 application topically 2 (two) times daily.   Veltassa 8.4 g packet Generic drug: patiromer Take 1 packet by mouth daily.        Disposition and follow-up:   Derrick Hendrix. was discharged from [facility] in Stable condition.  At the hospital follow up visit please address:  1.  Follow-up:  A. Potassium: Patient found to have K+ of 6.8 on arrival. At discharge, K+ was down to 4.8. Please monitor with a BMP.  B. Goals of care/Support: Would likely benefit from a chronic care referral or even palliative care to provide more resources for him if dialysis is not an option.    2.  Labs / imaging needed at time of follow-up: BMP, CBC  3.  Pending labs/ test needing follow-up: None  Follow-up Appointments:    Hospital Course by problem list: Hyperkalemia  Acute On Chronic Renal Failure Patient w/ CKD stage IV with worsening renal function. Followed by nephrology and vascular surgery, however there is no plan for dialysis at this time due to  his psoriasis flare and hemiballismus. He presented for an evaluation for hyperkalemia (K+ 6.0). In the ED, CMP showed K+ of 6.8, bicarb of 13, Cr of 4.77 BUN of 78. Pt received Kayexalate, sodium bicarb, insulin, D50 and albuterol inhaler. EKG did not show peaked T waves. BMP the next day showed K+ of 4.8, bicarb of 16, Cr of 3.62 BUN of 68. Patient's cramping had resolved at day of discharge. At discharge, he was started on Sodium bicarb 1300 mg BID, discontinued Florinef and increased lasix from 20 mg to 40 mg daily. He will follow up with [facility] clinic for lab check.    Afib on Eliquis Patient had sinus rhythm on EKG. We continued his Eliquis 2.5 mg tab BID.   Hypertension Patient with history of uncontrolled hypertension. We continued his Amlodipine 10 mg tab daily and Hydralazine 15 mg TID. BP improved to 150/75 at day of discharge.   Constipation Patient reported a history of constipation.  He has bowel movement every 3 to 4 days. He often needs a laxative to help move his bowel. We put him on Dulcolax 5 mg daily PRN and Colace 100 mg daily.    Type 2 diabetes mellitus Last A1C of 5.5, 9 months ago. Patient follows lifestyle modifications. Blood sugar was 133 on BMP.    Benign prostatic hyperplasia Patient with history of BPH. Denied any dysuria. We continued his home Tamulosin 0.4 mg daily and Finasteride 5 mg daily.    Hyperlipidemia Stable chronic. Last LDL of 95 four years ago. Atorvastatin 80 mg was continued.   Psoarisis Diffuse macular rash consistent with diffuse psoriasis. Patient notes rash is pruritic in nature. He is followed by dermatology. We continued his home meds.      Discharge Vitals:   BP (!) 160/83   Pulse 87   Temp 98 F (36.7 C) (Oral)   Resp 14   SpO2 95%   Pertinent Labs, Studies, and Procedures:  CBC Latest Ref Rng & Units 11/21/2020 11/20/2020 11/19/2020  WBC 4.0 - 10.5 K/uL 5.8 5.0 5.6  Hemoglobin 13.0 - 17.0 g/dL 11.9(L) 13.3 12.9(L)  Hematocrit 39 -  52 % 39.4 43.4 43.6  Platelets 150 - 400 K/uL 249 235 256    CMP Latest Ref Rng & Units 11/21/2020 11/20/2020 11/20/2020  Glucose 70 - 99 mg/dL 133(H) 87 91  BUN 8 - 23 mg/dL 68(H) 74(H) 78(H)  Creatinine 0.61 - 1.24 mg/dL 3.62(H) 3.97(H) 4.77(H)  Sodium 135 - 145 mmol/L 142 142 142  Potassium 3.5 - 5.1 mmol/L 4.8 5.1 6.8(HH)  Chloride 98 - 111 mmol/L 115(H) 117(H) 116(H)  CO2 22 - 32 mmol/L 16(L) 14(L) 13(L)  Calcium 8.9 - 10.3 mg/dL 8.6(L) 8.9 9.3  Total Protein 6.5 - 8.1 g/dL - - 7.4  Total Bilirubin 0.3 - 1.2 mg/dL - - 0.5  Alkaline Phos 38 - 126 U/L - - 67  AST 15 - 41 U/L - -  17  ALT 0 - 44 U/L - - 11    No results found.   Discharge Instructions:    Derrick Hendrix,   It was a pleasure taking care of you in the hospital. Your potassium level is now back to the normal range. We are discharging you home to follow up with your primary care doctor next week. We have made some changes to your medications and they are below.   1) Started you on Sodium bicarb 1300 mg twice daily 2) Discontinued your Florinef 3) Increased your lasix from 20 mg to 40 mg twice daily  Make sure to go to your appointment next week.  Take care,  Dr. Linwood Dibbles, MD, MPH  Signed: Lacinda Axon, MD 11/21/2020, 1:01 PM   Pager: [phone]

## 2020-11-21 NOTE — ED Notes (Signed)
Pt stated he would call his ride from the lobby. Pt ambulatory to lobby, NAD noted.

## 2020-11-21 NOTE — ED Notes (Signed)
Rounding team at bedside °

## 2020-11-21 NOTE — Discharge Instructions (Signed)
Derrick Hendrix,   It was a pleasure taking care of you in the hospital. Your potassium level is now back to the normal range. We are discharging you home to follow up with your primary care doctor next week. We have made some changes to your medications and they are below.   1) Started you on Sodium bicarb 1300 mg twice daily 2) Discontinued your Florinef 3) Increased your lasix from 20 mg to 40 mg twice daily  Make sure to go to your appointment next week.  Take care,  Dr. Linwood Dibbles, MD, MPH

## 2020-11-21 NOTE — Progress Notes (Addendum)
Subjective:   Hospital day: 1  Overnight event: NAEOV  This AM, patient was laying comfortably in bed. He states he feels much better today. His cramping has improved and he was able to move his bowel this AM. Patient was informed about our plans to make changes to his medications and discharge him home with plans to have close follow up with [facility] [place] clinic. Patient states her niece is bringing all her meds for [place] to verify.   Objective:  Vital signs in last 24 hours: Vitals:   11/21/20 0400 11/21/20 0415 11/21/20 0430 11/21/20 0445  BP: (!) 189/101 (!) 198/97 (!) 189/102 (!) 193/102  Pulse: (!) 103 85 (!) 107 (!) 117  Resp: 18 (!) 21 18 18   Temp:      TempSrc:      SpO2: 96% 98% 96% 96%    Physical Exam  General: Pleasant, well-appearing elderly man laying in bed. No acute distress. Head: Normocephalic. Atraumatic. CV: RRR. No murmurs, rubs, or gallops. No LE edema Pulmonary: Lungs CTAB. Normal effort. No wheezing or rales. Abdominal: Soft, nontender, nondistended. Normal bowel sounds. Extremities: Palpable pulses. Normal ROM. Skin: Warm and dry. Diffuse hyperpigmented, macular, pruritic rash. Neuro: A&Ox3. Moves all extremities. Hemiballismus on the left side. Normal sensation. Strength 5/5 in all extremities. Psych: Normal mood and affect  Assessment/Plan: Derrick Hendrix. is a 79 y.o. 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  Active Problems:   Hyperkalemia  #Hyperkalemia  #Acute On Chronic Renal Failure Patient w/ CKD stage IV with worsening renal function. Followed by nephrology and vascular surgery, however there is no plan for dialysis at this time due to his psoriasis flare and hemiballismus. Patient was found to have a potassium of 6.0 yesterday at the [facility]. Sent to the  ED for further evaluation and admission for his hyperkalemia. In the ED, CMP showed K+ of 6.8, bicarb of 13, Cr of 4.77 BUN of 78. Pt received Kayexalate, sodium bicarb, insulin, D50 and albuterol inhaler. EKG did not show peaked T waves. BMP today shows K+ of 4.8, bicarb of 16, Cr of 3.62 BUN of 68. This AM, patient states he is doing much better. His cramping has improved. Plan for discharge today with improvement in renal function and K+.   --Started on Sodium bicarb 1300 mg po BID --Discontinued Florinef --Increased lasix from 20 mg to 40 mg BID --Darbepoetin alfa injection 100 mcg every Saturday --Will follow up with [facility] clinic  #Afib on Eliquis Patient with sinus rhythm on EKG today. Will put on monitor and watch closely. --Eliquis 2.5 mg tab BID --Tele   #Hypertension Patient with history of uncontrolled hypertension, BP improved to 160/83. Will resume home meds at discharge. --Amlodipine 10 mg tab daily --Hydralazine 15 mg TID --Daily vitals   #Constipation Patient states she has a history of constipation.  He has bowel movement every 3 to 4 days. He often needs a laxative to help move his bowel. --Dulcolax 5 mg daily PRN --Colace 100 mg daily   #Type 2 diabetes mellitus Last A1C of 5.5, 9 months ago. Patient follows lifestyle modifications. Current blood sugar is 133 on BMP. Will follow up with PCP   #Benign prostatic hyperplasia Patient with history of BPH. Denies any dysuria. --Continue home Tamulosin 0.4 mg daily and Finasteride 5 mg daily   #  Hyperlipidemia Stable chronic.  Last LDL of 95 four years ago.  --Atorvastatin 80 mg ordered   #History of cerebellar infarct with left-sided hemiballismus Patient has residual left sided hemiballismus since infarct which is evident on exam. --Chronic stable   #Psoarisis Diffuse macular rash consistent with diffuse psoriasis. Patient notes rash is pruritic in nature. He is followed by dermatology. --Prednisone 10 mg  daily --Kenalog 0.1% cream BID --Temovate 0.05% topical BID --Sarna lotion PRN for itching   CODE STATUS: Full Code IVF: LR DIET: renal diet PPx: Eliquis 2.5 mg BID  Prior to Admission Living Arrangement: Home Anticipated Discharge Location: Home Barriers to Discharge: None Dispo: Anticipated discharge today  Signed: Lacinda Axon, MD 11/21/2020, 6:36 AM  Pager: [phone] Internal Medicine Teaching Service After 5pm on weekdays and 1pm on weekends: On Call pager: [phone]

## 2020-11-22 ENCOUNTER — Telehealth: Payer: Medicare HMO

## 2020-11-22 ENCOUNTER — Telehealth: Payer: Self-pay

## 2020-11-22 ENCOUNTER — Telehealth: Payer: Self-pay | Admitting: Internal Medicine

## 2020-11-22 ENCOUNTER — Telehealth: Payer: Self-pay | Admitting: *Deleted

## 2020-11-22 ENCOUNTER — Encounter: Payer: Medicare HMO | Admitting: Internal Medicine

## 2020-11-22 NOTE — Telephone Encounter (Signed)
Thanks

## 2020-11-22 NOTE — Telephone Encounter (Signed)
TOC HFU appointment 11/29/2020 at 10:15 am with Dr. Eileen Stanford.

## 2020-11-22 NOTE — Telephone Encounter (Signed)
-----   Message from Lacinda Axon, MD sent at 11/21/2020  1:35 PM EST ----- Regarding: Hospital F/U Please schedule a hospital follow up for this patient for next week with his PCP Dr. Eileen Stanford. We are about to discharge him today. Thanks. ~Dr. Coy Saunas

## 2020-11-22 NOTE — Telephone Encounter (Signed)
  Chronic Care Management   Outreach Note  11/22/2020 Name: Derrick Hendrix. MRN: [id number] DOB: Dec 02, 1941  Referred by: [name], MD Reason for referral : Care Coordination   Derrick Hendrix. is enrolled in a Managed Medicaid Health Plan: No  Attempted to contact patient's niece, Derrick Hendrix, regarding level of care concerns/services that were discussed with RNCM, Kelli Churn, during last office visit.    Follow Up Plan: A HIPAA compliant phone message was left for niece providing contact information and requesting a return call.      Ronn Melena, [place] Coordination Social Worker [place] [phone]

## 2020-11-22 NOTE — Telephone Encounter (Signed)
Pt did not come to his appt this morning. I called pt - stated he just got out of the hospital yesterday, so he's very tired, needs some sleep. Stated his niece will all back to re-schedule his appt.

## 2020-11-23 ENCOUNTER — Telehealth: Payer: Self-pay | Admitting: *Deleted

## 2020-11-23 ENCOUNTER — Telehealth: Payer: Self-pay

## 2020-11-23 ENCOUNTER — Telehealth: Payer: Medicare HMO

## 2020-11-23 ENCOUNTER — Ambulatory Visit: Payer: Medicare HMO

## 2020-11-23 DIAGNOSIS — N184 Chronic kidney disease, stage 4 (severe): Secondary | ICD-10-CM

## 2020-11-23 DIAGNOSIS — I63532 Cerebral infarction due to unspecified occlusion or stenosis of left posterior cerebral artery: Secondary | ICD-10-CM

## 2020-11-23 DIAGNOSIS — E1122 Type 2 diabetes mellitus with diabetic chronic kidney disease: Secondary | ICD-10-CM

## 2020-11-23 DIAGNOSIS — N183 Chronic kidney disease, stage 3 unspecified: Secondary | ICD-10-CM

## 2020-11-23 NOTE — Telephone Encounter (Signed)
  Chronic Care Management   Outreach Note  11/23/2020 Name: Derrick Hendrix. MRN: [id number] DOB: 02-05-1941  Referred by: [name], MD Reason for referral : Chronic Care Management (NIDDM, HTN, HLD, CKD, A fib, s/p CVA with left sided spasticity )   [name]. is enrolled in a Managed Medicaid Health Plan: No  An unsuccessful telephone outreach was attempted today to complete hospital discharge transition of care assessment. . The patient was referred to the case management team for assistance with care management and care coordination.   Follow Up Plan: A HIPAA compliant phone message was left for the patient providing contact information and requesting a return call.   Kelli Churn RN, CCM, [facility] RN Care Manager [phone]

## 2020-11-23 NOTE — Patient Instructions (Signed)
Social Worker Visit Information  Patient Care Plan: Patient will investigate PACE and [employer] with his niece [name].    Problem Identified: Patient needs assitance with ADLS and IADLS.   Priority: High  Onset Date: 11/19/2020    Goal: Self-Management Plan Developed   Start Date: 11/19/2020  Expected End Date: 03/14/2021  Recent Progress: On track  Priority: High  Note:   Current Barriers:  . Care Coordination needs related to alternate housing and/or community assistance in a patient with NIDDM, HTN, HLD, CKD, A fib, s/p CVA with left sided spasticity - long discussion with patient during his clinic visit to address ADL/IADL deficits and to offer options for assistance; patient does not want to pursue personal care services application and does agree to investigate PACE and assisted living facilities . Unable to perform ADLs independently . Unable to perform IADLs independently  Nurse Case Manager Clinical Goal(s):  [name] Over the next 30-60 days, patient will work with CCM clinical social worker to investigate PACE and assisted living facilities  Interventions:  . Inter-disciplinary care team collaboration (see longitudinal plan of care) . Talked with patient's niece regarding level of care concerns and services that have been discussed with patient, specifically ALF and [facility] . Informed niece that referral can be submitted to [facility] so that she and patient can meet with representative to further discuss program . Submitted referral to [facility]  Patient Goals/Self-Care Activities Over the next 30-60 days, patient will:  - work with CCM BSW and follow up with niece to investigate PACE and assisted living facilities  Follow Up Plan: The care management team will reach out to the patient again over the next 7-30 days.       Patient Care Plan: Patient needs assitance with establishing new  podiatrist as he is unable to perform nail care    Problem Identified:  Disease Progression (Diabetes, Type 2)   Priority: Medium  Onset Date: 11/19/2020  Note:   Current Barriers:  . Care Coordination needs related to foot care in a patient with NIDDM, HTN, HLD, CKD, A fib, s/p CVA with left sided spasticity  . Unable to independently secure new podiatrist- patient states he cannot cut his toenails but had a bad experience with a previous podiatrist so is seeking to establish with a new one  Nurse Case Manager Clinical Goal(s):  [name] Over the next 30 days, patient will meet with RN Care Manager to address establishing a new podiatrist  Interventions:  . Inter-disciplinary care team collaboration (see longitudinal plan of care) . Asked clinic provider for referral to podiatrist . At patient's request provided names of local podiatrist avoiding prior podiatrist to assist patient in securing appointment with new podiatrist  Ensure completion of annual comprehensive foot exam and dilated eye exam.    Implement additional individualized goals and interventions based on identified risk factors.   Prepare patient for referral for specialist care; podiatry  Provided update to patient's niece [name] via her voice mail and advised her this CCM RN will mail her the names of local podiatrist to assist patient with establishing care  Patient Goals/Self-Care Activities Over the next 30 days, patient will:  -  make and keep appointment with new podiatrist to complete nail and foot care           Follow Up Plan: CCM BSW will meet with patient during office visiti on 11/29/20 to further discuss level of care options and Advance Directives.  Ronn Melena, [place] Coordination Social Worker [place] [phone]

## 2020-11-23 NOTE — Telephone Encounter (Signed)
  Chronic Care Management   Outreach Note  11/23/2020 Name: Derrick Hendrix. MRN: [id number] DOB: 01-15-1941  Referred by: [name], MD Reason for referral : [place]. is enrolled in a Managed Medicaid Health Plan: No  Second unsuccessful attempt today to reach patient's niece regarding level of care concerns/services.  The patient was referred to the case management team for assistance with care management and care coordination.   Follow Up Plan: A HIPAA compliant phone message was left for the patient providing contact information and requesting a return call.   Patient scheduled for hospital follow up on 11/29/20 @ 10:15 AM.  Will plan to meet with patient during office visit.  Ronn Melena, [place] Coordination Social Worker [place] [phone]

## 2020-11-23 NOTE — Progress Notes (Signed)
Internal Medicine Clinic Attending  Case discussed with Dr. [name]  At the time of the visit.  We reviewed the resident's history and exam and pertinent patient test results.  I agree with the assessment, diagnosis, and plan of care documented in the resident's note.  

## 2020-11-23 NOTE — Progress Notes (Signed)
Internal Medicine Clinic Attending  CCM services provided by the care management provider and their documentation were discussed with Dr.  Wynetta Emery . We reviewed the pertinent findings, urgent action items addressed by the resident and non-urgent items to be addressed by the PCP.  I agree with the assessment, diagnosis, and plan of care documented in the CCM and resident's note.  Aldine Contes, MD 11/23/2020

## 2020-11-23 NOTE — Chronic Care Management (AMB) (Signed)
Chronic Care Management    Clinical Social Work Follow Up Note  11/23/2020 Name: Dmarius Reeder. MRN: [id number] DOB: [birth year]  Christepher Melchior. is a 79 y.o. year old male who is a primary care patient of [name], [name], MD. The CCM team was consulted for assistance with Level of Care Concerns.   Review of patient status, including review of consultants reports, other relevant assessments, and collaboration with appropriate care team members and the patient's provider was performed as part of comprehensive patient evaluation and provision of chronic care management services.    SDOH (Social Determinants of Health) assessments performed: No    Outpatient Encounter Medications as of 11/23/2020  Medication Sig  . amLODipine (NORVASC) 10 MG tablet TAKE ONE TABLET BY MOUTH DAILY (Patient taking differently: Take 10 mg by mouth daily. )  . apixaban (ELIQUIS) 2.5 MG TABS tablet Take 1 tablet (2.5 mg total) by mouth 2 (two) times daily.  [name] atorvastatin (LIPITOR) 80 MG tablet TAKE ONE TABLET BY MOUTH DAILY (Patient taking differently: Take 80 mg by mouth daily. )  . BISACODYL 5 MG EC tablet TAKE 1 TABLET (5 MG TOTAL) BY MOUTH DAILY AS NEEDED FOR MODERATE CONSTIPATION. (Patient taking differently: Take 5 mg by mouth daily as needed for mild constipation. )  . Blood Pressure Monitoring (BLOOD PRESSURE KIT) DEVI Please check blood pressure twice a day. In the morning and evening. Please record numbers on a sheet of paper. Blood pressure parameters are 120-150/80-90  . calcitRIOL (ROCALTROL) 0.25 MCG capsule Take 0.25 mcg by mouth daily.  . camphor-menthol (SARNA) lotion Apply topically as needed for itching. (Patient taking differently: Apply 1 application topically as needed for itching. )  . clobetasol cream (TEMOVATE) 0.05 % APPLY TOPICALLY TWO (TWO) TIMES DAILY. (Patient taking differently: Apply 1 application topically 2 (two) times daily. )  . Darbepoetin Alfa (ARANESP) 100 MCG/0.5ML SOSY  injection Inject 0.5 mLs (100 mcg total) into the skin every Saturday at 6 PM.  . docusate sodium (COLACE) 100 MG capsule Take 1 capsule (100 mg total) by mouth daily.  . finasteride (PROSCAR) 5 MG tablet TAKE ONE TABLET BY MOUTH DAILY  . furosemide (LASIX) 40 MG tablet Take 1 tablet (40 mg total) by mouth 2 (two) times daily.  . hydrALAZINE (APRESOLINE) 10 MG tablet Take 1.5 tablets (15 mg total) by mouth 3 (three) times daily.  . nitroGLYCERIN (NITROSTAT) 0.4 MG SL tablet Place 1 tablet (0.4 mg total) under the tongue every 5 (five) minutes x 3 doses as needed for chest pain.  . predniSONE (DELTASONE) 10 MG tablet Take 10 mg by mouth daily.   . sodium bicarbonate 650 MG tablet Take 2 tablets (1,300 mg total) by mouth 2 (two) times daily.  . tamsulosin (FLOMAX) 0.4 MG CAPS capsule TAKE TWO CAPSULES BY MOUTH DAILY (Patient taking differently: Take 0.8 mg by mouth daily. )  . triamcinolone ointment (KENALOG) 0.1 % Apply 1 application topically 2 (two) times daily.  . VELTASSA 8.4 g packet Take 1 packet by mouth daily.   No facility-administered encounter medications on file as of 11/23/2020.      Patient Care Plan: Patient will investigate PACE and [place] with his niece [name].    Problem Identified: Patient needs assitance with ADLS and IADLS.   Priority: High  Onset Date: 11/19/2020    Goal: Self-Management Plan Developed   Start Date: 11/19/2020  Expected End Date: 03/14/2021  Recent Progress: On track  Priority: High  Note:  Current Barriers:  . Care Coordination needs related to alternate housing and/or community assistance in a patient with NIDDM, HTN, HLD, CKD, A fib, s/p CVA with left sided spasticity - long discussion with patient during his clinic visit to address ADL/IADL deficits and to offer options for assistance; patient does not want to pursue personal care services application and does agree to investigate [facility]ACE and assisted living facilities . Unable  to perform ADLs independently . Unable to perform IADLs independently  Nurse Case Manager Clinical Goal(s):  [name] Over the next 30-60 days, patient will work with CCM clinical social worker to investigate PACE and assisted living facilities  Interventions:  . Inter-disciplinary care team collaboration (see longitudinal plan of care) . Talked with patient's niece regarding level of care concerns and services that have been discussed with patient, specifically ALF and [facility] . Informed niece that referral can be submitted to [facility] so that she and patient can meet with representative to further discuss program . Submitted referral to [facility]  Patient Goals/Self-Care Activities Over the next 30-60 days, patient will:  - work with CCM BSW and follow up with niece to investigate PACE and assisted living facilities  Follow Up Plan: The care management team will reach out to the patient again over the next 7-30 days.       Patient Care Plan: Patient needs assitance with establishing new  podiatrist as he is unable to perform nail care    Problem Identified: Disease Progression (Diabetes, Type 2)   Priority: Medium  Onset Date: 11/19/2020  Note:   Current Barriers:  . Care Coordination needs related to foot care in a patient with NIDDM, HTN, HLD, CKD, A fib, s/p CVA with left sided spasticity  . Unable to independently secure new podiatrist- patient states he cannot cut his toenails but had a bad experience with a previous podiatrist so is seeking to establish with a new one  Nurse Case Manager Clinical Goal(s):  [name] Over the next 30 days, patient will meet with RN Care Manager to address establishing a new podiatrist  Interventions:  . Inter-disciplinary care team collaboration (see longitudinal plan of care) . Asked clinic provider for referral to podiatrist . At patient's request provided names of local podiatrist avoiding prior podiatrist to assist patient in securing appointment with  new podiatrist  Ensure completion of annual comprehensive foot exam and dilated eye exam.    Implement additional individualized goals and interventions based on identified risk factors.   Prepare patient for referral for specialist care; podiatry  Provided update to patient's niece [name] via her voice mail and advised her this CCM RN will mail her the names of local podiatrist to assist patient with establishing care  Patient Goals/Self-Care Activities Over the next 30 days, patient will:  -  make and keep appointment with new podiatrist to complete nail and foot care          Follow Up Plan: CCM BSW will meet with patient during office visiti on 11/29/20 to further discuss level of care options and Advance Directives.      Ronn Melena, [place] Coordination Social Worker [place] [phone]

## 2020-11-26 ENCOUNTER — Telehealth: Payer: Self-pay | Admitting: *Deleted

## 2020-11-26 ENCOUNTER — Telehealth: Payer: Medicare HMO

## 2020-11-26 NOTE — Telephone Encounter (Signed)
Transition Care Management Follow-up Telephone Call   Date discharged?"last week"   How have you been since you were released from the hospital? "im doin great miss nurse"   Do you understand why you were in the hospital? "my blood needed some more stuff"   Do you understand the discharge instructions? "I sure do and if I dont my niece does"   Where were you discharged to? "home"   Items Reviewed:  Medications reviewed: "yes take my medicine the way they say, my niece makes sure"  Allergies reviewed: yes  Dietary changes reviewed: "I eat just like in the hospital, I eat good but not too much"  Referrals reviewed: yes   Functional Questionnaire:   Activities of Daily Living (ADLs):  If I need help my niece helps me"  States they require assistance with the following: I dont need no help, my niece takes good care of me"   Any transportation issues/concerns?: "my niece gets me to all my appts, we coming up there this week, reviewed appt, he was aware   Any patient concerns? "none, miss nurse"   Confirmed importance and date/time of follow-up visits scheduled yes, he has them written down... dr Eileen Stanford    Confirmed with patient if condition begins to worsen call PCP or go to the ER.  Patient was given the office number and encouraged to call back with question or concerns.  : "ill call or my niece will 911"

## 2020-11-26 NOTE — Progress Notes (Signed)
Internal Medicine Clinic Attending  CCM services provided by the care management provider and their documentation were discussed with Dr. Sharon Seller. We reviewed the pertinent findings, urgent action items addressed by the resident and non-urgent items to be addressed by the PCP.  I agree with the assessment, diagnosis, and plan of care documented in the CCM and resident's note.  Axel Filler, MD 11/26/2020

## 2020-11-26 NOTE — Telephone Encounter (Signed)
°  Chronic Care Management   Outreach Note  11/26/2020 Name: Derrick Hendrix. MRN: [id number] DOB: 1941-03-17  Referred by: [name], MD Reason for referral : Chronic Care Management (NIDDM, HTN, HLD, CKD, A fib, s/p CVA with left sided spasticity )   Derrick Hendrix. is enrolled in a Managed Medicaid Health Plan: No  A second unsuccessful telephone outreach was attempted today.  The patient was referred to the case management team for assistance with care management and care coordination.   Follow Up Plan: The care management team will reach out to the patient again over the next 7-14 days.   Kelli Churn RN, CCM, [facility] RN Care Manager [phone]

## 2020-11-26 NOTE — Progress Notes (Signed)
Internal Medicine Clinic Resident  I have personally reviewed this encounter including the documentation in this note and/or discussed this patient with the care management provider. I will address any urgent items identified by the care management provider and will communicate my actions to the patient's PCP. I have reviewed the patient's CCM visit with my supervising attending, Dr Vincent.  Pierson Vantol A Jatorian Renault, DO 11/26/2020    

## 2020-11-28 DIAGNOSIS — N184 Chronic kidney disease, stage 4 (severe): Secondary | ICD-10-CM | POA: Diagnosis not present

## 2020-11-29 ENCOUNTER — Other Ambulatory Visit: Payer: Self-pay

## 2020-11-29 ENCOUNTER — Telehealth: Payer: Self-pay

## 2020-11-29 ENCOUNTER — Encounter: Payer: Self-pay | Admitting: Internal Medicine

## 2020-11-29 ENCOUNTER — Ambulatory Visit (INDEPENDENT_AMBULATORY_CARE_PROVIDER_SITE_OTHER): Payer: Medicare HMO | Admitting: Internal Medicine

## 2020-11-29 ENCOUNTER — Ambulatory Visit: Payer: Medicare HMO

## 2020-11-29 VITALS — BP 144/70 | HR 97 | Ht 74.0 in | Wt 201.3 lb

## 2020-11-29 DIAGNOSIS — Z7189 Other specified counseling: Secondary | ICD-10-CM

## 2020-11-29 DIAGNOSIS — N183 Chronic kidney disease, stage 3 unspecified: Secondary | ICD-10-CM

## 2020-11-29 DIAGNOSIS — I63532 Cerebral infarction due to unspecified occlusion or stenosis of left posterior cerebral artery: Secondary | ICD-10-CM

## 2020-11-29 DIAGNOSIS — E2749 Other adrenocortical insufficiency: Secondary | ICD-10-CM | POA: Diagnosis not present

## 2020-11-29 DIAGNOSIS — N184 Chronic kidney disease, stage 4 (severe): Secondary | ICD-10-CM

## 2020-11-29 DIAGNOSIS — J3489 Other specified disorders of nose and nasal sinuses: Secondary | ICD-10-CM | POA: Insufficient documentation

## 2020-11-29 DIAGNOSIS — I480 Paroxysmal atrial fibrillation: Secondary | ICD-10-CM

## 2020-11-29 DIAGNOSIS — I10 Essential (primary) hypertension: Secondary | ICD-10-CM | POA: Diagnosis not present

## 2020-11-29 DIAGNOSIS — Z0000 Encounter for general adult medical examination without abnormal findings: Secondary | ICD-10-CM

## 2020-11-29 DIAGNOSIS — E1122 Type 2 diabetes mellitus with diabetic chronic kidney disease: Secondary | ICD-10-CM

## 2020-11-29 NOTE — Assessment & Plan Note (Signed)
CKD stage IV, hyperkalemia: He was recently admitted to the hospital from November 20, 2020 to November 21, 2020 for management of hyperkalemia.  On admission, his potassium was 6.8 and while in the hospital he was managed with Kayexalate, sodium bicarb, insulin, D50 and albuterol.  At discharge, his potassium was 4.8.  He was started on sodium bicarb 1300 mg twice daily  Plan: -Follow BMP -Continue Botox daily

## 2020-11-29 NOTE — Patient Instructions (Addendum)
Visit Information Patient Care Plan: Patient will investigate [facility] and [place] with [name].    Problem Identified: Patient needs assitance with ADLS and IADLS.   Priority: High  Onset Date: 11/19/2020    Goal: Self-Management Plan Developed   Start Date: 11/19/2020  Expected End Date: 03/14/2021  Recent Progress: On track  Priority: High  Note:   Current Barriers:  . Care Hendrix needs related to alternate housing and/or community assistance in a patient with NIDDM, HTN, HLD, CKD, A fib, s/p CVA with left sided spasticity - long discussion with patient during his clinic visit to address ADL/IADL deficits and to offer options for assistance; patient does not want to pursue personal care services application and does agree to investigate [facility]ACE and assisted living facilities . Unable to perform ADLs independently . Unable to perform IADLs independently  Nurse Case Manager Clinical Goal(s):  [name] Over the next 30-60 days, patient will work with CCM clinical social worker to investigate [facility]ACE and assisted living facilities  Interventions:  . Inter-disciplinary care team collaboration (see longitudinal plan of care) . Contacted [facility] to ensure receipt of referral  Received communication from Intake staff, Derrick Hendrix, that they have been unable to get in touch with patient's niece .  [name]  Patient Goals/Self-Care Activities Over the next 30-60 days, patient will:  - work with CCM BSW and follow up with niece to investigate PACE and assisted living facilities  Follow Up Plan: The care management team will reach out to the patient again over the next 7-30 days.       Patient Care Plan: Patient needs assitance with establishing new  podiatrist as he is unable to perform nail care    Problem Identified: Disease Progression (Diabetes, Type 2)   Priority: Medium  Onset Date: 11/19/2020  Note:   Current Barriers:  . Care Hendrix needs related to foot care in  a patient with NIDDM, HTN, HLD, CKD, A fib, s/p CVA with left sided spasticity  . Unable to independently secure new podiatrist- patient states he cannot cut his toenails but had a bad experience with a previous podiatrist so is seeking to establish with a new one  Nurse Case Manager Clinical Goal(s):  [name] Over the next 30 days, patient will meet with RN Care Manager to address establishing a new podiatrist  Interventions:  . Inter-disciplinary care team collaboration (see longitudinal plan of care) . Asked clinic provider for referral to podiatrist . At patient's request provided names of local podiatrist avoiding prior podiatrist to assist patient in securing appointment with new podiatrist  Ensure completion of annual comprehensive foot exam and dilated eye exam.    Implement additional individualized goals and interventions based on identified risk factors.   Prepare patient for referral for specialist care; podiatry  Provided update to patient's niece Derrick Hendrix via her voice mail and advised her this CCM RN will mail her the names of local podiatrist to assist patient with establishing care  Patient Goals/Self-Care Activities Over the next 30 days, patient will:  -  make and keep appointment with new podiatrist to complete nail and foot care  Follow Up Plan: The care management team will reach out to the patient again over the next 30-60 days.        Follow Up Plan: Attempted to follow up with patient's niece via phone today as she has agreed to assist patient with researching/obtaining higher level of care; PACE, ALF. Left message requesting return call.  Will  attempt to reach her again next week if no return call.      Derrick Hendrix, Derrick Hendrix Social Worker Derrick Hendrix [phone]

## 2020-11-29 NOTE — Patient Instructions (Signed)
Derrick Hendrix,  Thanks for seeing me today.  Today, we tested you for Covid and I am getting blood work.  I will call you with the results.  I will have you see our chronic care management team so we continue our discussion on finding you more help you to take care of yourself.  Take care! Dr. Eileen Stanford  Please call the internal medicine center clinic if you have any questions or concerns, we may be able to help and keep you from a long and expensive emergency room wait. Our clinic and after hours phone number is [phone], the best time to call is Monday through Friday 9 am to 4 pm but there is always someone available 24/7 if you have an emergency. If you need medication refills please notify your pharmacy one week in advance and they will send [place] a request.   If you have not gotten the COVID vaccine, I recommend doing so:  You may get it at your local [facility] or [facility] OR To schedule an appointment for a COVID vaccine or be added to the vaccine wait list: Go to [email or web address]   OR Go to [email or web address]                  OR Call [phone]                                     OR Call [phone] and select Option 2

## 2020-11-29 NOTE — Assessment & Plan Note (Signed)
Hyporeninemic hypoaldosteronism: His Florinef was discontinued in the hospital as this was thought to be contributing to his elevated blood pressure

## 2020-11-29 NOTE — Assessment & Plan Note (Signed)
Rhinorrhea: He states that when he was discharged from the hospital he felt great for about 2 days however for the past 3 days he has been experiencing rhinorrhea, sneezing, cough and throat irritation.  He denies cough, chills, dysgeusia, abdominal pain, diarrhea.  He denies any sick contacts.  He states that somehow he was left outside for about 30 to 45 minutes after he left the hospital and believes this is why he is having his symptoms.  He has not received his Covid vaccination yet and states that he wanted to talk to his doctor 1st.  I have made him aware that he is eligible for the vaccination and he states he will make his needs a work today so he can get the vaccine.  On physical exams, he does have boggy nasal turbinates bilaterally.  No evidence of tonsillar exudates.  Assessment and plan: Upper respiratory viral infection of unknown etiology.  He has not received his Covid vaccination yet.  We will go ahead and test for SARS-CoV-2 today

## 2020-11-29 NOTE — Progress Notes (Signed)
Internal Medicine Clinic Attending  CCM services provided by the care management provider and their documentation were reviewed with Dr. [name].  We reviewed the pertinent findings, urgent action items addressed by the resident and non-urgent items to be addressed by the PCP.  I agree with the assessment, diagnosis, and plan of care documented in the CCM and resident's note.  Alexander N Raines, MD 11/29/2020  

## 2020-11-29 NOTE — Progress Notes (Signed)
Internal Medicine Clinic Attending  Case discussed with Dr. [name] at the time of the visit.  We reviewed the resident's history and exam and pertinent patient test results.  I agree with the assessment, diagnosis, and plan of care documented in the resident's note.  Kassadi Presswood, M.D., Ph.D.  

## 2020-11-29 NOTE — Progress Notes (Signed)
   CC: Follow-up hyperkalemia  HPI:  [name]. is a 79 y.o. with medical history significant for CKD stage IV with persistent hyperkalemia, atrial fibrillation on Eliquis, hypertension, type 2 diabetes mellitus, psoriasis who was recently admitted to the hospital presented for follow-up.  Please see problem based charting for further details.   Past Medical History:  Diagnosis Date  . Abscess of foot without toes, left 08/28/2017  . Chronic kidney disease (CKD), stage III (moderate) (HCC)   . Constipation 11/07/2014  . Daily headache    "lately" (04/17/2016)  . Depression   . Dizziness 03/07/2016  . Headache 06/26/2014  . Heart murmur   . Hyperkalemia 10/05/2018  . Hyperlipemia 11/11/2016  . Hypertension   . Neuropathic pain 02/06/2015  . Osteomyelitis ([place])   . PAD (peripheral artery disease) ([place])   . Paronychia of great toe, right   . Preop cardiovascular exam   . Right foot pain 02/09/2017  . Stable angina ([place]) 06/27/2014  . Stroke ([place]) 03/2016   hx of PAD; j"ust lots of headaches since" (04/17/2016)  . Vitamin B12 deficiency 06/27/2014  . Weight loss, unintentional 06/27/2014   Review of Systems:  As per HPI  Physical Exam:  Vitals:   11/29/20 0915  BP: (!) 144/70  Pulse: 97  SpO2: 98%  Weight: 201 lb 4.8 oz (91.3 kg)  Height: 6\' 2"  (1.88 m)   Physical Exam Constitutional:      Comments: Chronic hemiballism  HENT:     Nose: Congestion and rhinorrhea present.     Mouth/Throat:     Mouth: Mucous membranes are moist.     Pharynx: No oropharyngeal exudate or posterior oropharyngeal erythema.  Cardiovascular:     Rate and Rhythm: Normal rate.     Heart sounds: Normal heart sounds.  Pulmonary:     Effort: Pulmonary effort is normal. No respiratory distress.     Breath sounds: Normal breath sounds. No wheezing.  Psychiatric:        Mood and Affect: Mood normal.        Behavior: Behavior normal.     Assessment & Plan:   See Encounters Tab for  problem based charting.  Patient discussed with Dr. Rebeca Alert

## 2020-11-29 NOTE — Assessment & Plan Note (Signed)
°  Health maintenance: We have had ongoing discussions with Derrick Hendrix regarding his living situation.  He lives alone and given his chronic medical problems, myself and his niece was concerned about him being alone by himself and not been able to cater.  He does want to keep his independence.  I had Marcie Bal our chronic care team see him during my last visit and that conversation was quite fruitful as he is thinking about personalized home care services.  I will have him see Amber today as well for ongoing discussion about getting more assistance to help him.

## 2020-11-29 NOTE — Assessment & Plan Note (Signed)
Hypertension: During his hospital stay, Florinef was discontinued as this was thought to be contributing to his elevated blood pressure.  BP Readings from Last 3 Encounters:  11/29/20 (!) 144/70  11/21/20 (!) 150/75  11/19/20 133/69    Plan: -Continue amlodipine 10 mg daily -Continue Lasix 40 mg twice daily -Continue hydralazine 50 mg 3 times daily -Continue Flomax 0.8 mg -Discontinue Florinef

## 2020-11-29 NOTE — Telephone Encounter (Signed)
  Chronic Care Management   Outreach Note  11/29/2020 Name: Derrick Hendrix. MRN: [id number] DOB: 10-Jul-1941  Referred by: [name], MD Reason for referral : [place]. is enrolled in a Managed Medicaid Health Plan: No  Attempted to reach patient's niece today to follow up on level of care concerns as she has agreed to assist with obtaining services.  Left voicemail message requesting return call.  Will attempt to reach her again next week if no return call.     Ronn Melena, [place] Coordination Social Worker [place] [phone]

## 2020-11-29 NOTE — Chronic Care Management (AMB) (Signed)
Chronic Care Management    Clinical Social Work Follow Up Note  11/29/2020 Name: Derrick Hendrix. MRN: [id number] DOB: 1941/01/29  Derrick Hendrix. is a 79 y.o. year old male who is a primary care patient of [name], [name], MD. The CCM team was consulted for assistance with Level of Care Concerns.   Review of patient status, including review of consultants reports, other relevant assessments, and collaboration with appropriate care team members and the patient's provider was performed as part of comprehensive patient evaluation and provision of chronic care management services.    SDOH (Social Determinants of Health) assessments performed: No    Outpatient Encounter Medications as of 11/29/2020  Medication Sig  . amLODipine (NORVASC) 10 MG tablet TAKE ONE TABLET BY MOUTH DAILY (Patient taking differently: Take 10 mg by mouth daily. )  . apixaban (ELIQUIS) 2.5 MG TABS tablet Take 1 tablet (2.5 mg total) by mouth 2 (two) times daily.  [name] atorvastatin (LIPITOR) 80 MG tablet TAKE ONE TABLET BY MOUTH DAILY (Patient taking differently: Take 80 mg by mouth daily. )  . BISACODYL 5 MG EC tablet TAKE 1 TABLET (5 MG TOTAL) BY MOUTH DAILY AS NEEDED FOR MODERATE CONSTIPATION. (Patient taking differently: Take 5 mg by mouth daily as needed for mild constipation. )  . Blood Pressure Monitoring (BLOOD PRESSURE KIT) DEVI Please check blood pressure twice a day. In the morning and evening. Please record numbers on a sheet of paper. Blood pressure parameters are 120-150/80-90  . calcitRIOL (ROCALTROL) 0.25 MCG capsule Take 0.25 mcg by mouth daily.  . camphor-menthol (SARNA) lotion Apply topically as needed for itching. (Patient taking differently: Apply 1 application topically as needed for itching. )  . clobetasol cream (TEMOVATE) 0.05 % APPLY TOPICALLY TWO (TWO) TIMES DAILY. (Patient taking differently: Apply 1 application topically 2 (two) times daily. )  . Darbepoetin Alfa (ARANESP) 100 MCG/0.5ML SOSY  injection Inject 0.5 mLs (100 mcg total) into the skin every Saturday at 6 PM.  . docusate sodium (COLACE) 100 MG capsule Take 1 capsule (100 mg total) by mouth daily.  . finasteride (PROSCAR) 5 MG tablet TAKE ONE TABLET BY MOUTH DAILY  . furosemide (LASIX) 40 MG tablet Take 1 tablet (40 mg total) by mouth 2 (two) times daily.  . hydrALAZINE (APRESOLINE) 10 MG tablet Take 1.5 tablets (15 mg total) by mouth 3 (three) times daily.  . nitroGLYCERIN (NITROSTAT) 0.4 MG SL tablet Place 1 tablet (0.4 mg total) under the tongue every 5 (five) minutes x 3 doses as needed for chest pain.  . predniSONE (DELTASONE) 10 MG tablet Take 10 mg by mouth daily.   . sodium bicarbonate 650 MG tablet Take 2 tablets (1,300 mg total) by mouth 2 (two) times daily.  . tamsulosin (FLOMAX) 0.4 MG CAPS capsule TAKE TWO CAPSULES BY MOUTH DAILY (Patient taking differently: Take 0.8 mg by mouth daily. )  . triamcinolone ointment (KENALOG) 0.1 % Apply 1 application topically 2 (two) times daily.  . VELTASSA 8.4 g packet Take 1 packet by mouth daily.   No facility-administered encounter medications on file as of 11/29/2020.     Patient Care Plan: [facility] with [name].    Problem Identified: Patient needs assitance with ADLS and IADLS.   Priority: High  Onset Date: 11/19/2020    Goal: Self-Management Plan Developed   Start Date: 11/19/2020  Expected End Date: 03/14/2021  Recent Progress: On track  Priority: High  Note:  Current Barriers:  . Care Coordination needs related to alternate housing and/or community assistance in a patient with NIDDM, HTN, HLD, CKD, A fib, s/p CVA with left sided spasticity - long discussion with patient during his clinic visit to address ADL/IADL deficits and to offer options for assistance; patient does not want to pursue personal care services application and does agree to investigate [facility] and assisted living facilities . Unable to  perform ADLs independently . Unable to perform IADLs independently  Nurse Case Manager Clinical Goal(s):  [name] Over the next 30-60 days, patient will work with CCM clinical social worker to investigate [facility] and assisted living facilities  Interventions:  . Inter-disciplinary care team collaboration (see longitudinal plan of care) . Contacted [facility] to ensure receipt of referral  Received communication from Intake staff, Doree Barthel, that they have been unable to get in touch with patient's niece   Patient Goals/Self-Care Activities Over the next 30-60 days, patient will:  - work with CCM BSW and follow up with niece to investigate [facility] and assisted living facilities  Follow Up Plan: The care management team will reach out to the patient again over the next 7-30 days.       Patient Care Plan: Patient needs assitance with establishing new  podiatrist as he is unable to perform nail care    Problem Identified: Disease Progression (Diabetes, Type 2)   Priority: Medium  Onset Date: 11/19/2020  Note:   Current Barriers:  . Care Coordination needs related to foot care in a patient with NIDDM, HTN, HLD, CKD, A fib, s/p CVA with left sided spasticity  . Unable to independently secure new podiatrist- patient states he cannot cut his toenails but had a bad experience with a previous podiatrist so is seeking to establish with a new one  Nurse Case Manager Clinical Goal(s):  [name] Over the next 30 days, patient will meet with RN Care Manager to address establishing a new podiatrist  Interventions:  . Inter-disciplinary care team collaboration (see longitudinal plan of care) . Asked clinic provider for referral to podiatrist . At patient's request provided names of local podiatrist avoiding prior podiatrist to assist patient in securing appointment with new podiatrist  Ensure completion of annual comprehensive foot exam and dilated eye exam.    Implement additional individualized goals and  interventions based on identified risk factors.   Prepare patient for referral for specialist care; podiatry  Provided update to patient's niece Derrick Hendrix via her voice mail and advised her this CCM RN will mail her the names of local podiatrist to assist patient with establishing care  Patient Goals/Self-Care Activities Over the next 30 days, patient will:  -  make and keep appointment with new podiatrist to complete nail and foot care  Follow Up Plan: The care management team will reach out to the patient again over the next 30-60 days.           Follow Up Plan: Attempted to follow up with patient's niece via phone today as she has agreed to assist patient with researching/obtaining higher level of care; [facility], ALF. Left message requesting return call.  Will attempt to reach her again next week if no return call.      Ronn Melena, [place] Coordination Social Worker [place] [phone]

## 2020-11-30 ENCOUNTER — Other Ambulatory Visit: Payer: Self-pay | Admitting: Internal Medicine

## 2020-11-30 ENCOUNTER — Telehealth: Payer: Self-pay

## 2020-11-30 ENCOUNTER — Encounter: Payer: Self-pay | Admitting: Internal Medicine

## 2020-11-30 DIAGNOSIS — Z7189 Other specified counseling: Secondary | ICD-10-CM

## 2020-11-30 DIAGNOSIS — Z515 Encounter for palliative care: Secondary | ICD-10-CM | POA: Insufficient documentation

## 2020-11-30 LAB — BMP8+ANION GAP
Anion Gap: 17 mmol/L (ref 10.0–18.0)
BUN/Creatinine Ratio: 16 (ref 10–24)
BUN: 80 mg/dL (ref 8–27)
CO2: 20 mmol/L (ref 20–29)
Calcium: 8.6 mg/dL (ref 8.6–10.2)
Chloride: 104 mmol/L (ref 96–106)
Creatinine, Ser: 4.85 mg/dL — ABNORMAL HIGH (ref 0.76–1.27)
GFR calc Af Amer: 12 mL/min/{1.73_m2} — ABNORMAL LOW (ref >59)
GFR calc non Af Amer: 10 mL/min/{1.73_m2} — ABNORMAL LOW (ref >59)
Glucose: 88 mg/dL (ref 65–99)
Potassium: 5.7 mmol/L — ABNORMAL HIGH (ref 3.5–5.2)
Sodium: 141 mmol/L (ref 134–144)

## 2020-11-30 LAB — CBC
Hematocrit: 35.8 % — ABNORMAL LOW (ref 37.5–51.0)
Hemoglobin: 11.4 g/dL — ABNORMAL LOW (ref 13.0–17.7)
MCH: 28.8 pg (ref 26.6–33.0)
MCHC: 31.8 g/dL (ref 31.5–35.7)
MCV: 90 fL (ref 79–97)
Platelets: 257 10*3/uL (ref 150–450)
RBC: 3.96 x10E6/uL — ABNORMAL LOW (ref 4.14–5.80)
RDW: 14.6 % (ref 11.6–15.4)
WBC: 7.5 10*3/uL (ref 3.4–10.8)

## 2020-11-30 LAB — SARS-COV-2, NAA 2 DAY TAT

## 2020-11-30 LAB — NOVEL CORONAVIRUS, NAA: SARS-CoV-2, NAA: NOT DETECTED

## 2020-11-30 NOTE — Telephone Encounter (Signed)
Returning Dr. Nelia Shi call. SChaplin, RN,BSN

## 2020-11-30 NOTE — Assessment & Plan Note (Signed)
I spoke to Derrick Hendrix's niece Derrick Hendrix, who is currently his sole caretaker regarding his medical condition and worsening renal failure.  During my visit with Derrick Hendrix yesterday, a BMP was drawn which showed worsening renal function, hyperkalemia with potassium of 5.7, BUN 80, creatinine 4.8 from baseline 3.62 when he was discharged from the hospital and a GFR of 12.  I have had the pleasure of taking care of Derrick Hendrix for the past 2 and half years and he has had ongoing worsening renal function for which she was followed up with nephrology and vascular surgery.  At the interim, he had persistent intermittent severe hyperkalemia and worsening renal function which required emergent medical attention and has been admitted to the hospital several times for this.  In addition, he recently developed severe diffuse psoriasis which complicated his need for vascular access.  His persistent hyperkalemia and renal disease is being followed up with nephrology for which he gets weekly blood drawn and is on chronic Veltassa.  Despite medical management, his renal function has continued to deteriorate.  [name] was concerned about patient living alone and not being able to care for himself and had requested for additional assistance including personal care services, skilled nursing facility, [facility] or any help at all.  We began this discussion during one of our recent visits and he had elected for personal care services however he had to be admitted to the hospital.  Today, I had a long and thorough discussion with Derrick Hendrix and made her aware that Derrick Hendrix is now in renal failure that has not been controlled with medical therapy.  Per nephrology and vascular surgery, he is not a candidate for dialysis.  Our next option is palliative care or hospice.  She states that she has a doctor's appointment today and after her appointment, she will go over to Derrick Hendrix's apartment and we can all have a discussion about  referral to palliative medicine.  Derrick Hendrix, his niece Lantz Hermann and myself regrouped on the phone to continue discussions about goals of care.  Derrick Hendrix was able to understand and reiterate his current condition including his worsening renal failure.  At the end of our conversation, both Derrick Hendrix and [name] decided to not pursue aggressive life measures and will like to pursue "DO NOT RESUSCITATE.  "In addition, Derrick Hendrix elected to not be admitted in the hospital for any further management.  He would like to focus on his quality of life and he agrees with palliative care consult.

## 2020-11-30 NOTE — Progress Notes (Signed)
I spoke to [name]'s niece Derrick Hendrix, who is currently his sole caretaker regarding his medical condition and worsening renal failure.  During my visit with Derrick Hendrix yesterday, a BMP was drawn which showed worsening renal function, hyperkalemia with potassium of 5.7, BUN 80, creatinine 4.8 from baseline 3.62 when he was discharged from the hospital and a GFR of 12.  I have had the pleasure of taking care of Derrick Hendrix for the past 2 and half years and he has had ongoing worsening renal function for which she was followed up with nephrology and vascular surgery.  At the interim, he had persistent intermittent severe hyperkalemia and worsening renal function which required emergent medical attention and has been admitted to the hospital several times for this.  In addition, he recently developed severe diffuse psoriasis which complicated his need for vascular access.  His persistent hyperkalemia and renal disease is being followed up with nephrology for which he gets weekly blood drawn and is on chronic Veltassa.  Despite medical management, his renal function has continued to deteriorate.  [name] was concerned about patient living alone and not being able to care for himself and had requested for additional assistance including personal care services, skilled nursing facility, [facility] or any help at all.  We began this discussion during one of our recent visits and he had elected for personal care services however he had to be admitted to the hospital.  Today, I had a long and thorough discussion with Derrick Hendrix and made her aware that Derrick Hendrix is now in renal failure that has not been controlled with medical therapy.  Per nephrology and vascular surgery, he is not a candidate for dialysis.  Our next option is palliative care or hospice.  She states that she has a doctor's appointment today and after her appointment, she will go over to Derrick Hendrix's apartment and we can all have a discussion about  referral to palliative medicine.

## 2020-11-30 NOTE — Addendum Note (Signed)
Addended by: [name] on: 11/30/2020 12:01 PM   Modules accepted: Orders

## 2020-11-30 NOTE — Progress Notes (Signed)
Internal Medicine Clinic Resident  I have personally reviewed this encounter including the documentation in this note and/or discussed this patient with the care management provider. I will address any urgent items identified by the care management provider and will communicate my actions to the patient's PCP. I have reviewed the patient's CCM visit with my supervising attending, Dr Raines.  Mylee Falin, MD 11/30/2020   

## 2020-11-30 NOTE — Telephone Encounter (Signed)
Pls contact pt [phone]

## 2020-11-30 NOTE — Progress Notes (Signed)
I spoke to [name]'s niece [name], who is currently his sole caretaker regarding his medical condition and worsening renal failure. During my visit with [name] yesterday, a BMP was drawn which showed worsening renal function, hyperkalemia with potassium of 5.7, BUN 80, creatinine 4.8 from baseline 3.62 when he was discharged from the hospital and a GFR of 12. I have had the pleasure of taking care of [name] for the past 2 and half years and he has had ongoing worsening renal function for which she was followed up with nephrology and vascular surgery. At the interim, he had persistent intermittent severe hyperkalemia and worsening renal function which required emergent medical attention and has been admitted to the hospital several times for this. In addition, he recently developed severe diffuse psoriasis which complicated his need for vascular access. His persistent hyperkalemia and renal disease is being followed up with nephrology for which he gets weekly blood drawn and is on chronic Veltassa. Despite medical management, his renal function has continued to deteriorate. [name] concerned about patient living alone and not being able to care for himself and had requested for additional assistance including personal care services, skilled nursing facility, [facility] or any help at all. We began this discussion during one of our recent visits and he had elected for personal care services however he had to be admitted to the hospital. Today, I had a long and thorough discussion with Derrick Hendrix and made her aware that Mr. Salek is now in renal failure that has not been controlled with medical therapy. Per nephrology and vascular surgery, he is not a candidate for dialysis. Our next option is palliative care or hospice. She states that she has a doctor's appointment today and after her appointment, she will go over to [name]'s apartment and we can all have a discussion about  referral to palliative medicine.  Mr. Deon Derrick Hendrix, his niece Lenis Derrick Hendrix and myself regrouped on the phone to continue discussions about goals of care.  [name] was able to understand and reiterate his current condition including his worsening renal failure.  At the end of our conversation, both [name] and his niece [name] decided to not pursue aggressive life measures and will like to pursue "DO NOT RESUSCITATE.  "In addition, Mr. Sausedo elected to not be admitted in the hospital for any further management.  He would like to focus on his quality of life and he agrees with palliative care consult.

## 2020-12-03 ENCOUNTER — Telehealth: Payer: Self-pay | Admitting: *Deleted

## 2020-12-03 ENCOUNTER — Other Ambulatory Visit: Payer: Self-pay

## 2020-12-03 ENCOUNTER — Telehealth: Payer: Medicare HMO

## 2020-12-03 ENCOUNTER — Ambulatory Visit (HOSPITAL_COMMUNITY)
Admission: RE | Admit: 2020-12-03 | Discharge: 2020-12-03 | Disposition: A | Discharge status: Home / Self Care | Payer: Medicare HMO | Source: Ambulatory Visit | Attending: Nephrology | Admitting: Nephrology

## 2020-12-03 VITALS — BP 156/91 | HR 96 | Temp 97.4°F | Resp 20

## 2020-12-03 DIAGNOSIS — N184 Chronic kidney disease, stage 4 (severe): Secondary | ICD-10-CM | POA: Diagnosis not present

## 2020-12-03 LAB — POCT HEMOGLOBIN-HEMACUE: Hemoglobin: 12.1 g/dL — ABNORMAL LOW (ref 13.0–17.0)

## 2020-12-03 MED ORDER — EPOETIN ALFA 10000 UNIT/ML IJ SOLN: 10000.0000 [IU] | INTRAMUSCULAR | Status: DC

## 2020-12-03 NOTE — Progress Notes (Signed)
Internal Medicine Clinic Attending  CCM services provided by the care management provider and their documentation were reviewed with Dr. Shon Baton.  We reviewed the pertinent findings, urgent action items addressed by the resident and non-urgent items to be addressed by the PCP.  I agree with the assessment, diagnosis, and plan of care documented in the CCM and resident's note.  Oda Kilts, MD 12/03/2020

## 2020-12-03 NOTE — Telephone Encounter (Signed)
°  Chronic Care Management   Outreach Note  12/03/2020 Name: Derrick Hendrix. MRN: [id number] DOB: 16-Mar-1941  Referred by: [name], MD Reason for referral : Chronic Care Management (NIDDM, HTN, HLD, ESRD, A fib, s/p CVA with left sided spasticity )   [name]. is enrolled in a Managed Medicaid Health Plan: No  A second unsuccessful telephone outreach was attempted today. The patient was referred to the case management team for assistance with care management and care coordination.   Message received from Dr. Eileen Stanford on 11/30/20 states patient is now in renal filaure and not a candidate for hemodialysis so Palliative Care Consult was ordered.   Follow Up Plan: A HIPAA compliant phone message was left for the patient providing contact information and requesting a return call.  The care management team will reach out to the patient again over the next 7-14 days.   Kelli Churn RN, CCM, [facility] RN Care Manager [phone]

## 2020-12-04 ENCOUNTER — Telehealth: Payer: Self-pay

## 2020-12-04 ENCOUNTER — Ambulatory Visit: Payer: Medicare HMO

## 2020-12-04 DIAGNOSIS — N184 Chronic kidney disease, stage 4 (severe): Secondary | ICD-10-CM

## 2020-12-04 DIAGNOSIS — J3489 Other specified disorders of nose and nasal sinuses: Secondary | ICD-10-CM

## 2020-12-04 DIAGNOSIS — R1319 Other dysphagia: Secondary | ICD-10-CM

## 2020-12-04 DIAGNOSIS — I10 Essential (primary) hypertension: Secondary | ICD-10-CM

## 2020-12-04 NOTE — Telephone Encounter (Signed)
  Chronic Care Management   Outreach Note  12/04/2020 Name: Derrick Hendrix. MRN: [id number] DOB: May 10, 1941  Referred by: [name], MD Reason for referral : [name] is enrolled in a Managed Medicaid Health Plan: No   Talked to patient's niece, Derrick Hendrix, to determine if she or patient have been contacted about initiation of palliative services.  Niece was unable to talk at time of call and said she would call CCM BSW back.      Derrick Hendrix, [place] Coordination Social Worker [place] [phone]

## 2020-12-04 NOTE — Chronic Care Management (AMB) (Signed)
Chronic Care Management    Clinical Social Work Follow Up Note  12/04/2020 Name: Derrick Hendrix. MRN: [id number] DOB: 08/10/1941  Dsean Hendrix. is a 79 y.o. year old male who is a primary care patient of [name], [name], MD. The CCM team was consulted for assistance with Level of Care Concerns.   Review of patient status, including review of consultants reports, other relevant assessments, and collaboration with appropriate care team members and the patient's provider was performed as part of comprehensive patient evaluation and provision of chronic care management services.    SDOH (Social Determinants of Health) assessments performed: No    Outpatient Encounter Medications as of 12/04/2020  Medication Sig  . amLODipine (NORVASC) 10 MG tablet TAKE ONE TABLET BY MOUTH DAILY (Patient taking differently: Take 10 mg by mouth daily. )  . apixaban (ELIQUIS) 2.5 MG TABS tablet Take 1 tablet (2.5 mg total) by mouth 2 (two) times daily.  [name] atorvastatin (LIPITOR) 80 MG tablet TAKE ONE TABLET BY MOUTH DAILY (Patient taking differently: Take 80 mg by mouth daily. )  . BISACODYL 5 MG EC tablet TAKE 1 TABLET (5 MG TOTAL) BY MOUTH DAILY AS NEEDED FOR MODERATE CONSTIPATION. (Patient taking differently: Take 5 mg by mouth daily as needed for mild constipation. )  . Blood Pressure Monitoring (BLOOD PRESSURE KIT) DEVI Please check blood pressure twice a day. In the morning and evening. Please record numbers on a sheet of paper. Blood pressure parameters are 120-150/80-90  . calcitRIOL (ROCALTROL) 0.25 MCG capsule Take 0.25 mcg by mouth daily.  . camphor-menthol (SARNA) lotion Apply topically as needed for itching. (Patient taking differently: Apply 1 application topically as needed for itching. )  . clobetasol cream (TEMOVATE) 0.05 % APPLY TOPICALLY TWO (TWO) TIMES DAILY. (Patient taking differently: Apply 1 application topically 2 (two) times daily. )  . Darbepoetin Alfa (ARANESP) 100 MCG/0.5ML SOSY  injection Inject 0.5 mLs (100 mcg total) into the skin every Saturday at 6 PM.  . docusate sodium (COLACE) 100 MG capsule Take 1 capsule (100 mg total) by mouth daily.  . finasteride (PROSCAR) 5 MG tablet TAKE ONE TABLET BY MOUTH DAILY  . furosemide (LASIX) 40 MG tablet Take 1 tablet (40 mg total) by mouth 2 (two) times daily.  . hydrALAZINE (APRESOLINE) 10 MG tablet Take 1.5 tablets (15 mg total) by mouth 3 (three) times daily.  . nitroGLYCERIN (NITROSTAT) 0.4 MG SL tablet Place 1 tablet (0.4 mg total) under the tongue every 5 (five) minutes x 3 doses as needed for chest pain.  . predniSONE (DELTASONE) 10 MG tablet Take 10 mg by mouth daily.   . sodium bicarbonate 650 MG tablet Take 2 tablets (1,300 mg total) by mouth 2 (two) times daily.  . tamsulosin (FLOMAX) 0.4 MG CAPS capsule TAKE TWO CAPSULES BY MOUTH DAILY (Patient taking differently: Take 0.8 mg by mouth daily. )  . triamcinolone ointment (KENALOG) 0.1 % Apply 1 application topically 2 (two) times daily.  . VELTASSA 8.4 g packet Take 1 packet by mouth daily.   No facility-administered encounter medications on file as of 12/04/2020.     Patient Care Plan: Level of care concerns    Problem Identified: Patient needs assitance with ADLS and IADLS.   Priority: High  Onset Date: 11/19/2020    Goal: Self-Management Plan Developed   Start Date: 11/19/2020  Expected End Date: 03/14/2021  Recent Progress: On track  Priority: High  Note:   Current Barriers:  . Care Coordination needs related  to alternate housing and/or community assistance in a patient with NIDDM, HTN, HLD, CKD, A fib, s/p CVA with left sided spasticity - long discussion with patient during his clinic visit to address ADL/IADL deficits and to offer options for assistance; patient does not want to pursue personal care services application and does agree to investigate PACE and assisted living facilities . Dr. Nathanial Rancher discussed Palliative Care with patient and niece on  11/30/20.  Patient consented to referral for services.  . Unable to perform ADLs independently . Unable to perform IADLs independently  Nurse Case Manager Clinical Goal(s):  [name] Over the next 30-60 days, patient will work with CCM clinical social worker to investigate PACE and assisted living facilities  Interventions:  . Inter-disciplinary care team collaboration (see longitudinal plan of care) . Called niece to determine if she/patient have been contacted by [facility] regarding intake for palliative services . Provided niece with contact information for [facility] and encouraged her to call to schedule intake . Received call back from niece stating that, per [facility] representative, order has not been received . Collaborated with [facility] Director, Yvonna Alanis, regarding status of order . Spoke to [employer], Erline Levine, and submitted referral . Provided Erline Levine with nieces's contact information and asked that she be called between the hours of 9:00AM and 1:00PM to schedule intake . Called niece back to inform her that referral was submitted and she will be contacted for intake Patient Goals/Self-Care Activities Over the next 30-60 days, patient will:  - work with [place] and follow up with niece to investigate PACE and assisted living facilities  Follow Up Plan: The care management team will reach out to the patient again over the next 7-30 days.       Patient Care Plan: Patient needs assitance with establishing new  podiatrist as he is unable to perform nail care    Problem Identified: Disease Progression (Diabetes, Type 2)   Priority: Medium  Onset Date: 11/19/2020  Note:   Current Barriers:  . Care Coordination needs related to foot care in a patient with NIDDM, HTN, HLD, CKD, A fib, s/p CVA with left sided spasticity  . Unable to independently secure new podiatrist- patient states he cannot cut his toenails but had a bad experience with a previous podiatrist so  is seeking to establish with a new one  Nurse Case Manager Clinical Goal(s):  [name] Over the next 30 days, patient will meet with RN Care Manager to address establishing a new podiatrist  Interventions:  . Inter-disciplinary care team collaboration (see longitudinal plan of care) . Asked clinic provider for referral to podiatrist . At patient's request provided names of local podiatrist avoiding prior podiatrist to assist patient in securing appointment with new podiatrist  Ensure completion of annual comprehensive foot exam and dilated eye exam.    Implement additional individualized goals and interventions based on identified risk factors.   Prepare patient for referral for specialist care; podiatry  Provided update to patient's niece [name] via her voice mail and advised her this CCM RN will mail her the names of local podiatrist to assist patient with establishing care  Patient Goals/Self-Care Activities Over the next 30 days, patient will:  -  make and keep appointment with new podiatrist to complete nail and foot care  Follow Up Plan: The care management team will reach out to the patient again over the next 30-60 days.           Follow Up Plan: Will follow  up with niece before the end of the week to ensure she was contacted for intake.      Ronn Melena, [place] Coordination Social Worker [place] [phone]

## 2020-12-04 NOTE — Progress Notes (Signed)
Internal Medicine Clinic Resident  I have personally reviewed this encounter including the documentation in this note and/or discussed this patient with the care management provider. I will address any urgent items identified by the care management provider and will communicate my actions to the patient's PCP. I have reviewed the patient's CCM visit with my supervising attending, Dr Narendra.  Derrick Machi K Wess Baney, MD 12/04/2020    

## 2020-12-04 NOTE — Patient Instructions (Signed)
Social Worker Visit Information   Patient Care Plan: Level of care concerns    Problem Identified: Patient needs assitance with ADLS and IADLS.   Priority: High  Onset Date: 11/19/2020    Goal: Self-Management Plan Developed   Start Date: 11/19/2020  Expected End Date: 03/14/2021  Recent Progress: On track  Priority: High  Note:   Current Barriers:  . Care Coordination needs related to alternate housing and/or community assistance in a patient with NIDDM, HTN, HLD, CKD, A fib, s/p CVA with left sided spasticity - long discussion with patient during his clinic visit to address ADL/IADL deficits and to offer options for assistance; patient does not want to pursue personal care services application and does agree to investigate PACE and assisted living facilities . Dr. Nathanial Rancher discussed Palliative Care with patient and niece on 11/30/20.  Patient consented to referral for services.  . Unable to perform ADLs independently . Unable to perform IADLs independently  Nurse Case Manager Clinical Goal(s):  [name] Over the next 30-60 days, patient will work with CCM clinical social worker to investigate PACE and assisted living facilities  Interventions:  . Inter-disciplinary care team collaboration (see longitudinal plan of care) . Called niece to determine if she/patient have been contacted by [facility] regarding intake for palliative services . Provided niece with contact information for [facility] and encouraged her to call to schedule intake . Received call back from niece stating that, per [facility] representative, order has not been received . Collaborated with [facility] Director, Yvonna Alanis, regarding status of order . Spoke to [employer], Erline Levine, and submitted referral . Provided Erline Levine with nieces's contact information and asked that she be called between the hours of 9:00AM and 1:00PM to schedule intake . Called niece back to inform her that referral was submitted  and she will be contacted for intake Patient Goals/Self-Care Activities Over the next 30-60 days, patient will:  - work with [place] and follow up with niece to investigate PACE and assisted living facilities  Follow Up Plan: The care management team will reach out to the patient again over the next 7-30 days.       Patient Care Plan: Patient needs assitance with establishing new  podiatrist as he is unable to perform nail care    Problem Identified: Disease Progression (Diabetes, Type 2)   Priority: Medium  Onset Date: 11/19/2020  Note:   Current Barriers:  . Care Coordination needs related to foot care in a patient with NIDDM, HTN, HLD, CKD, A fib, s/p CVA with left sided spasticity  . Unable to independently secure new podiatrist- patient states he cannot cut his toenails but had a bad experience with a previous podiatrist so is seeking to establish with a new one  Nurse Case Manager Clinical Goal(s):  [name] Over the next 30 days, patient will meet with RN Care Manager to address establishing a new podiatrist  Interventions:  . Inter-disciplinary care team collaboration (see longitudinal plan of care) . Asked clinic provider for referral to podiatrist . At patient's request provided names of local podiatrist avoiding prior podiatrist to assist patient in securing appointment with new podiatrist  Ensure completion of annual comprehensive foot exam and dilated eye exam.    Implement additional individualized goals and interventions based on identified risk factors.   Prepare patient for referral for specialist care; podiatry  Provided update to patient's niece [name] via her voice mail and advised her this CCM RN will mail her the names of local  podiatrist to assist patient with establishing care  Patient Goals/Self-Care Activities Over the next 30 days, patient will:  -  make and keep appointment with new podiatrist to complete nail and foot care  Follow Up Plan: The care  management team will reach out to the patient again over the next 30-60 days.         Follow Up Plan: Will follow up with niece before the end of the week to ensure she was contacted for intake.      Ronn Melena, [place] Coordination Social Worker [place] [phone]

## 2020-12-05 ENCOUNTER — Telehealth: Payer: Self-pay

## 2020-12-05 NOTE — Telephone Encounter (Signed)
Spoke with patients niece [name] and scheduled an in-person Palliative Consult for 01/11/21 @ 9AM. Niece requested this date and time to coordinate with her work schedule.    COVID screening was negative. No pets in home. Patient lives alone.   Consent obtained; updated Outlook/Netsmart/Team List and Epic.

## 2020-12-06 ENCOUNTER — Telehealth: Payer: Self-pay

## 2020-12-06 ENCOUNTER — Telehealth: Payer: Medicare HMO

## 2020-12-06 NOTE — Telephone Encounter (Signed)
  Chronic Care Management   Outreach Note  12/06/2020 Name: Derrick Hendrix. MRN: [id number] DOB: 1940-12-21  Referred by: [name], MD Reason for referral : [place]. is enrolled in a Managed Medicaid Health Plan: No   Unsuccessful outreach to patient's niece today.    Follow Up Plan: A HIPAA compliant phone message was left for the patient providing contact information and requesting a return call.       Ronn Melena, [place] Coordination Social Worker [place] [phone]

## 2020-12-10 ENCOUNTER — Encounter (HOSPITAL_COMMUNITY): Payer: Medicare HMO

## 2020-12-10 ENCOUNTER — Other Ambulatory Visit (HOSPITAL_COMMUNITY): Payer: Self-pay | Admitting: Nurse Practitioner

## 2020-12-11 ENCOUNTER — Telehealth: Payer: Medicare HMO

## 2020-12-11 ENCOUNTER — Telehealth: Payer: Self-pay | Admitting: *Deleted

## 2020-12-11 NOTE — Telephone Encounter (Signed)
  Chronic Care Management   Outreach Note  12/11/2020 Name: Derrick Hendrix. MRN: [id number] DOB: [birth year]  Referred by: [name], MD Reason for referral : Chronic Care Management ( NIDDM, HTN, HLD, ESRD, A fib, s/p CVA with left sided spasticity )   [name]. is enrolled in a Managed Medicaid Health Plan: No  Third unsuccessful telephone outreach was attempted today. The patient was referred to the case management team for assistance with care management and care coordination. A HIPAA compliant phone message was left for the patient providing contact information and requesting a return call.  Chart reviewed and noted that Palliative Care Consult is scheduled for 01/11/21.    Follow Up Plan: Will review chart once Palliative Care Consult is completed and close referral to CCM services at that time if indicated.   Kelli Churn RN, CCM, [facility] RN Care Manager [phone]

## 2020-12-12 ENCOUNTER — Telehealth: Payer: Self-pay | Admitting: *Deleted

## 2020-12-12 ENCOUNTER — Ambulatory Visit: Payer: Medicare HMO | Admitting: *Deleted

## 2020-12-12 DIAGNOSIS — I63532 Cerebral infarction due to unspecified occlusion or stenosis of left posterior cerebral artery: Secondary | ICD-10-CM

## 2020-12-12 DIAGNOSIS — I10 Essential (primary) hypertension: Secondary | ICD-10-CM

## 2020-12-12 DIAGNOSIS — I480 Paroxysmal atrial fibrillation: Secondary | ICD-10-CM

## 2020-12-12 DIAGNOSIS — N184 Chronic kidney disease, stage 4 (severe): Secondary | ICD-10-CM | POA: Diagnosis not present

## 2020-12-12 DIAGNOSIS — N183 Chronic kidney disease, stage 3 unspecified: Secondary | ICD-10-CM

## 2020-12-12 NOTE — Telephone Encounter (Signed)
  Chronic Care Management   Note  12/12/2020 Name: Derrick Hendrix. MRN: [id number] DOB: 09-Jan-1941  Returned call to patient's niece [name] after she left voice message at 12:37 pm.   Follow up plan: A HIPAA compliant phone message was left for the patient's niece providing contact information and requesting a return call.   Kelli Churn RN, CCM, [facility] RN Care Manager [phone]

## 2020-12-12 NOTE — Chronic Care Management (AMB) (Addendum)
  Chronic Care Management   Note  12/12/2020 Name: Derrick Hendrix. MRN: [id number] DOB: [birth year]   Patient's niece, Dareon Nunziato,  called with several questions related to patient's length of time with receiving care at [facility], the names of clinic faculty staff as Bernadene Person doe not recognize resident MDs as in network providers and name and phone number of podiatrist patient was referred to during last clinic visit.  [name] states patient is doing OK although he is sleeping more than usual.  She and patient are aware of Palliative Care Consult scheduled for 01/11/21.  Follow up plan: Telephone follow up appointment with care management team member scheduled for: 01/14/21.  Kelli Churn RN, CCM, [facility] RN Care Manager [phone]

## 2020-12-12 NOTE — Telephone Encounter (Signed)
  Chronic Care Management   Outreach Note  12/12/2020 Name: Derrick Hendrix. MRN: [id number] DOB: 1941/11/16  Referred by: [name], MD Reason for referral : Chronic Care Management (NIDDM, HTN, HLD, ESRD, A fib, s/p CVA with left sided spasticity )   [name]. is enrolled in a Managed Medicaid Health Plan: No  Third unsuccessful telephone outreach was attempted today. The patient was referred to the case management team for assistance with care management and care coordination.   Follow Up Plan: A HIPAA compliant phone message was left for the patient providing contact information and requesting a return call. Also wished patient a Happy New Year on the message.  Kelli Churn RN, CCM, [facility] RN Care Manager [phone]

## 2020-12-12 NOTE — Progress Notes (Signed)
Internal Medicine Clinic Attending  CCM services provided by the care management provider and their documentation were discussed with Dr. Eileen Stanford. We reviewed the pertinent findings, urgent action items addressed by the resident and non-urgent items to be addressed by the PCP.  I agree with the assessment, diagnosis, and plan of care documented in the CCM and resident's note.  Aldine Contes, MD 12/12/2020

## 2020-12-17 ENCOUNTER — Other Ambulatory Visit: Payer: Self-pay

## 2020-12-17 ENCOUNTER — Ambulatory Visit (HOSPITAL_COMMUNITY)
Admission: RE | Admit: 2020-12-17 | Discharge: 2020-12-17 | Disposition: A | Discharge status: Home / Self Care | Payer: Medicare HMO | Source: Ambulatory Visit | Attending: Nephrology | Admitting: Nephrology

## 2020-12-17 VITALS — BP 148/104 | HR 93 | Temp 98.4°F | Resp 20

## 2020-12-17 DIAGNOSIS — N184 Chronic kidney disease, stage 4 (severe): Secondary | ICD-10-CM | POA: Insufficient documentation

## 2020-12-17 LAB — FERRITIN: Ferritin: 443 ng/mL — ABNORMAL HIGH (ref 24–336)

## 2020-12-17 LAB — IRON AND TIBC
Iron: 70 ug/dL (ref 45–182)
Saturation Ratios: 23 % (ref 17.9–39.5)
TIBC: 300 ug/dL (ref 250–450)
UIBC: 230 ug/dL

## 2020-12-17 LAB — POCT HEMOGLOBIN-HEMACUE: Hemoglobin: 10.2 g/dL — ABNORMAL LOW (ref 13.0–17.0)

## 2020-12-17 MED ORDER — EPOETIN ALFA 10000 UNIT/ML IJ SOLN
10000.0000 [IU] | INTRAMUSCULAR | Status: DC
  Administered 2020-12-17: 10000 [IU] via SUBCUTANEOUS

## 2020-12-17 MED ORDER — EPOETIN ALFA 10000 UNIT/ML IJ SOLN
INTRAMUSCULAR | Status: AC
  Filled 2020-12-17: qty 1

## 2020-12-17 MED ORDER — CLONIDINE HCL 0.1 MG PO TABS: 0.1000 mg | ORAL_TABLET | Freq: Once | ORAL | Status: DC | PRN

## 2020-12-19 ENCOUNTER — Encounter: Payer: Self-pay | Admitting: Gastroenterology

## 2020-12-19 ENCOUNTER — Ambulatory Visit (INDEPENDENT_AMBULATORY_CARE_PROVIDER_SITE_OTHER): Payer: Medicare HMO | Admitting: Gastroenterology

## 2020-12-19 ENCOUNTER — Encounter: Payer: Self-pay | Admitting: Internal Medicine

## 2020-12-19 ENCOUNTER — Telehealth: Payer: Self-pay

## 2020-12-19 ENCOUNTER — Other Ambulatory Visit: Payer: Self-pay

## 2020-12-19 ENCOUNTER — Ambulatory Visit (INDEPENDENT_AMBULATORY_CARE_PROVIDER_SITE_OTHER): Payer: Medicare HMO | Admitting: Internal Medicine

## 2020-12-19 ENCOUNTER — Telehealth: Payer: Medicare HMO

## 2020-12-19 VITALS — BP 144/66 | HR 70 | Ht 74.0 in | Wt 204.0 lb

## 2020-12-19 VITALS — BP 138/66 | HR 93 | Ht 74.0 in | Wt 204.0 lb

## 2020-12-19 DIAGNOSIS — R1310 Dysphagia, unspecified: Secondary | ICD-10-CM | POA: Diagnosis not present

## 2020-12-19 DIAGNOSIS — I639 Cerebral infarction, unspecified: Secondary | ICD-10-CM

## 2020-12-19 DIAGNOSIS — Z01810 Encounter for preprocedural cardiovascular examination: Secondary | ICD-10-CM

## 2020-12-19 DIAGNOSIS — I480 Paroxysmal atrial fibrillation: Secondary | ICD-10-CM

## 2020-12-19 DIAGNOSIS — D6869 Other thrombophilia: Secondary | ICD-10-CM

## 2020-12-19 HISTORY — PX: OTHER SURGICAL HISTORY: SHX169

## 2020-12-19 MED ORDER — OMEPRAZOLE 40 MG PO CPDR: DELAYED_RELEASE_CAPSULE | ORAL | 11 refills | Status: DC

## 2020-12-19 NOTE — Telephone Encounter (Signed)
Patient with afib and hx of stroke. Clearance is to hold 2 days. I would recommend holding only 1 day. However, I will defer to Dr. Rayann Heman.

## 2020-12-19 NOTE — Progress Notes (Signed)
PCP: [name], MD   Primary EP: Dr Rayann Heman  Derrick Hendrix. is a 80 y.o. male who presents today for routine electrophysiology followup.  Since last being seen in our clinic, the patient reports doing very well.  He has stable, chronic SOB.  Today, he denies symptoms of palpitations, chest pain,  dizziness, presyncope, or syncope.  The patient is otherwise without complaint today.   Past Medical History:  Diagnosis Date  . Abscess of foot without toes, left 08/28/2017  . Chronic kidney disease (CKD), stage III (moderate) (HCC)   . Constipation 11/07/2014  . Daily headache    "lately" (04/17/2016)  . Depression   . Dizziness 03/07/2016  . Headache 06/26/2014  . Heart murmur   . Hyperkalemia 10/05/2018  . Hyperlipemia 11/11/2016  . Hypertension   . Neuropathic pain 02/06/2015  . Osteomyelitis ([place])   . PAD (peripheral artery disease) ([place])   . Paronychia of great toe, right   . Preop cardiovascular exam   . Right foot pain 02/09/2017  . Stable angina ([place]) 06/27/2014  . Stroke ([place]) 03/2016   hx of PAD; j"ust lots of headaches since" (04/17/2016)  . Vitamin B12 deficiency 06/27/2014  . Weight loss, unintentional 06/27/2014   Past Surgical History:  Procedure Laterality Date  . ABDOMINAL AORTOGRAM W/LOWER EXTREMITY N/A 03/02/2017   Procedure: Abdominal Aortogram w/Lower Extremity;  Surgeon: Conrad York, MD;  Location: [facility];  Service: Cardiovascular;  Laterality: N/A;  . EP IMPLANTABLE DEVICE N/A 03/04/2016   Procedure: Loop Recorder Insertion;  Surgeon: Thompson Grayer, MD;  Location: [facility];  Service: Cardiovascular;  Laterality: N/A;  . PERIPHERAL VASCULAR INTERVENTION Right 03/06/2017   Procedure: Peripheral Vascular Intervention;  Surgeon: Elam Dutch, MD;  Location: [facility];  Service: Cardiovascular;  Laterality: Right;  SFA    ROS- all systems are reviewed and negatives except as per HPI above  Current Outpatient Medications   Medication Sig Dispense Refill  . amLODipine (NORVASC) 10 MG tablet TAKE ONE TABLET BY MOUTH DAILY (Patient taking differently: Take 10 mg by mouth daily.) 90 tablet 1  . atorvastatin (LIPITOR) 80 MG tablet TAKE ONE TABLET BY MOUTH DAILY (Patient taking differently: Take 80 mg by mouth daily.) 90 tablet 1  . BISACODYL 5 MG EC tablet TAKE 1 TABLET (5 MG TOTAL) BY MOUTH DAILY AS NEEDED FOR MODERATE CONSTIPATION. (Patient taking differently: Take 5 mg by mouth daily as needed for mild constipation.) 90 tablet 0  . Blood Pressure Monitoring (BLOOD PRESSURE KIT) DEVI Please check blood pressure twice a day. In the morning and evening. Please record numbers on a sheet of paper. Blood pressure parameters are 120-150/80-90 1 each 0  . calcitRIOL (ROCALTROL) 0.25 MCG capsule Take 0.25 mcg by mouth daily.    . clobetasol cream (TEMOVATE) 0.05 % APPLY TOPICALLY TWO (TWO) TIMES DAILY. (Patient taking differently: Apply 1 application topically 2 (two) times daily.) 60 g 0  . docusate sodium (COLACE) 100 MG capsule Take 1 capsule (100 mg total) by mouth daily. 90 capsule 3  . ELIQUIS 2.5 MG TABS tablet TAKE ONE TABLET BY MOUTH TWICE A DAY 180 tablet 1  . finasteride (PROSCAR) 5 MG tablet TAKE ONE TABLET BY MOUTH DAILY 90 tablet 1  . furosemide (LASIX) 40 MG tablet Take 1 tablet (40 mg total) by mouth 2 (two) times daily. 60 tablet 1  . hydrALAZINE (APRESOLINE) 10 MG tablet Take 1.5 tablets (15 mg total) by mouth  3 (three) times daily. 135 tablet 3  . nitroGLYCERIN (NITROSTAT) 0.4 MG SL tablet Place 1 tablet (0.4 mg total) under the tongue every 5 (five) minutes x 3 doses as needed for chest pain. 30 tablet 3  . omeprazole (PRILOSEC) 40 MG capsule Take one capsule shortly before breakfast each morning 30 capsule 11  . predniSONE (DELTASONE) 10 MG tablet Take 10 mg by mouth daily.     . sodium bicarbonate 650 MG tablet Take 2 tablets (1,300 mg total) by mouth 2 (two) times daily. 120 tablet 1  . tamsulosin  (FLOMAX) 0.4 MG CAPS capsule TAKE TWO CAPSULES BY MOUTH DAILY (Patient taking differently: Take 0.8 mg by mouth daily.) 90 capsule 0  . triamcinolone ointment (KENALOG) 0.1 % Apply 1 application topically 2 (two) times daily. 80 g 0  . VELTASSA 8.4 g packet Take 1 packet by mouth daily.     No current facility-administered medications for this visit.    Physical Exam: Vitals:   12/19/20 1128  BP: 138/66  Pulse: 93  Weight: 204 lb (92.5 kg)  Height: $Remove'6\' 2"'gpkuLgm$  (1.88 m)    GEN- The patient is chronically ill appearing, alert and oriented x 3 today.   Head- normocephalic, atraumatic Eyes-  Sclera clear, conjunctiva pink Ears- hearing intact Oropharynx- clear Lungs- Clear to ausculation bilaterally, normal work of breathing Heart- Regular rate and rhythm, no murmurs, rubs or gallops, PMI not laterally displaced GI- soft, NT, ND, + BS Extremities- no clubbing, cyanosis, or edema + psychomotor agitation  Wt Readings from Last 3 Encounters:  12/19/20 204 lb (92.5 kg)  12/19/20 204 lb (92.5 kg)  11/29/20 201 lb 4.8 oz (91.3 kg)    EKG tracing ordered today is personally reviewed and shows sinus with PACs  Assessment and Plan:  1. Stroke S/p ILR His device is at RRT.  We discussed options.  He would like it removed.  He understands risks including bleeding and infection.  2. afib Most rhythms on ILR were sinus with PACs.  He did have very rare afib. On eliquis  3. preop Plans for endoscopy noted.  Ok to proceed without further CV testing.  Hold eliquis only 24 hours prior to the procedure.  Risks, benefits and potential toxicities for medications prescribed and/or refilled reviewed with patient today.   Return to see EP PA in 6 months I will see as needed going forward  Thompson Grayer MD, [facility] 12/19/2020 12:01 PM        PROCEDURES:   1. Implantable loop recorder explantation       DESCRIPTION OF PROCEDURE:  Informed written consent was obtained.  The patient required no  sedation for the procedure today.   The patients left chest was therefore prepped and draped in the usual sterile fashion.  The skin overlying the ILR monitor was infiltrated with lidocaine for local analgesia.  A 0.5-cm incision was made over the site.  The previously implanted ILR was exposed and removed using a combination of sharp and blunt dissection.  Steri- Strips and a sterile dressing were then applied. EBL<83ml.  There were no early apparent complications.     CONCLUSIONS:   1. Successful explantation of a [employer] Reveal LINQ implantable loop recorder   2. No early apparent complications.        Thompson Grayer MD, [facility] 12/19/2020 12:02 PM

## 2020-12-19 NOTE — Telephone Encounter (Signed)
[name] Pre-operative Risk Assessment     Request for surgical clearance:     Endoscopy Procedure  What type of surgery is being performed?     Endoscopy  When is this surgery scheduled?     01-21-2021  What type of clearance is required ?   Pharmacy  Are there any medications that need to be held prior to surgery and how long? Eliquis x 2 days  Practice name and name of physician performing surgery?      [facility]  What is your office phone and fax number?      Phone- [phone]  [phone]  Anesthesia type (None, local, MAC, general) ?       MAC

## 2020-12-19 NOTE — Telephone Encounter (Signed)
Pt has appt today with Dr. Rayann Heman. I will send clearance notes to MD for appt for clearance.

## 2020-12-19 NOTE — Patient Instructions (Signed)
If you are age 82 or older, your body mass index should be between 23-30. Your Body mass index is 26.19 kg/m. If this is out of the aforementioned range listed, please consider follow up with your Primary Care Provider.  If you are age 52 or younger, your body mass index should be between 19-25. Your Body mass index is 26.19 kg/m. If this is out of the aformentioned range listed, please consider follow up with your Primary Care Provider.   You have been scheduled for an endoscopy. Please follow written instructions given to you at your visit today. If you use inhalers (even only as needed), please bring them with you on the day of your procedure.  Due to recent changes in healthcare laws, you may see the results of your imaging and laboratory studies on MyChart before your provider has had a chance to review them.  We understand that in some cases there may be results that are confusing or concerning to you. Not all laboratory results come back in the same time frame and the provider may be waiting for multiple results in order to interpret others.  Please give [place] 48 hours in order for your provider to thoroughly review all the results before contacting the office for clarification of your results.   We have sent the following medications to your pharmacy for you to pick up at your convenience:  START: omeprazole 40mg  one capsule shortly before breakfast meal each day.  Please continue to work on getting dentures with your dentist.  Thank you for entrusting me with your care and choosing [facility].  Dr Ardis Hughs

## 2020-12-19 NOTE — Progress Notes (Signed)
HPI: This is a very pleasant 80 year old man who was referred to me by Derrick Rosenthal, MD  to evaluate intermittent dysphagia.    He is here with his niece today.  He has suffered 2 strokes, most recent was 2 or 3 years ago.  He has balance issues and walks with a cane because of that.  He is on Eliquis because of his stroke as well.  Echocardiogram 2018 showed normal ejection fraction.  He has not had any since then that I can see  He has had intermittent solid food dysphagia.  He has an abnormal barium esophagram see below.  He has not been losing weight.  He does not have chronic GERD symptoms but does wake up with a dry sore throat usually.  He is not on any antiacid medicines   Old Data Reviewed: Barium esophagram September 2021, indication choking when swallowing, findings "nonspecific esophageal dysmotility disorder.  Incomplete distention of the gastroesophageal junction, with smooth narrowing in this vicinity, although a 13 mm barium tablet was not significantly delayed.  Difficult to exclude benign stenosis." Blood work December 2021 CBC was normal except for hemoglobin 11.4, normocytic, creatinine 5, BUN 80   Review of systems: Pertinent positive and negative review of systems were noted in the above HPI section. All other review negative.   Past Medical History:  Diagnosis Date  . Abscess of foot without toes, left 08/28/2017  . Chronic kidney disease (CKD), stage III (moderate) (HCC)   . Constipation 11/07/2014  . Daily headache    "lately" (04/17/2016)  . Depression   . Dizziness 03/07/2016  . Headache 06/26/2014  . Heart murmur   . Hyperkalemia 10/05/2018  . Hyperlipemia 11/11/2016  . Hypertension   . Neuropathic pain 02/06/2015  . Osteomyelitis ([place])   . PAD (peripheral artery disease) ([place])   . Paronychia of great toe, right   . Preop cardiovascular exam   . Right foot pain 02/09/2017  . Stable angina ([place]) 06/27/2014  . Stroke ([place]) 03/2016   hx of PAD; j"ust lots of  headaches since" (04/17/2016)  . Vitamin B12 deficiency 06/27/2014  . Weight loss, unintentional 06/27/2014    Past Surgical History:  Procedure Laterality Date  . ABDOMINAL AORTOGRAM W/LOWER EXTREMITY N/A 03/02/2017   Procedure: Abdominal Aortogram w/Lower Extremity;  Surgeon: Conrad LaFayette, MD;  Location: [facility];  Service: Cardiovascular;  Laterality: N/A;  . EP IMPLANTABLE DEVICE N/A 03/04/2016   Procedure: Loop Recorder Insertion;  Surgeon: Thompson Grayer, MD;  Location: [facility];  Service: Cardiovascular;  Laterality: N/A;  . PERIPHERAL VASCULAR INTERVENTION Right 03/06/2017   Procedure: Peripheral Vascular Intervention;  Surgeon: Elam Dutch, MD;  Location: [facility];  Service: Cardiovascular;  Laterality: Right;  SFA    Current Outpatient Medications  Medication Sig Dispense Refill  . amLODipine (NORVASC) 10 MG tablet TAKE ONE TABLET BY MOUTH DAILY (Patient taking differently: Take 10 mg by mouth daily.) 90 tablet 1  . atorvastatin (LIPITOR) 80 MG tablet TAKE ONE TABLET BY MOUTH DAILY (Patient taking differently: Take 80 mg by mouth daily.) 90 tablet 1  . BISACODYL 5 MG EC tablet TAKE 1 TABLET (5 MG TOTAL) BY MOUTH DAILY AS NEEDED FOR MODERATE CONSTIPATION. (Patient taking differently: Take 5 mg by mouth daily as needed for mild constipation.) 90 tablet 0  . Blood Pressure Monitoring (BLOOD PRESSURE KIT) DEVI Please check blood pressure twice a day. In the morning and evening. Please record numbers on a  sheet of paper. Blood pressure parameters are 120-150/80-90 1 each 0  . calcitRIOL (ROCALTROL) 0.25 MCG capsule Take 0.25 mcg by mouth daily.    . clobetasol cream (TEMOVATE) 0.05 % APPLY TOPICALLY TWO (TWO) TIMES DAILY. (Patient taking differently: Apply 1 application topically 2 (two) times daily.) 60 g 0  . docusate sodium (COLACE) 100 MG capsule Take 1 capsule (100 mg total) by mouth daily. 90 capsule 3  . ELIQUIS 2.5 MG TABS tablet TAKE ONE TABLET BY MOUTH  TWICE A DAY 180 tablet 1  . finasteride (PROSCAR) 5 MG tablet TAKE ONE TABLET BY MOUTH DAILY 90 tablet 1  . furosemide (LASIX) 40 MG tablet Take 1 tablet (40 mg total) by mouth 2 (two) times daily. 60 tablet 1  . hydrALAZINE (APRESOLINE) 10 MG tablet Take 1.5 tablets (15 mg total) by mouth 3 (three) times daily. 135 tablet 3  . nitroGLYCERIN (NITROSTAT) 0.4 MG SL tablet Place 1 tablet (0.4 mg total) under the tongue every 5 (five) minutes x 3 doses as needed for chest pain. 30 tablet 3  . predniSONE (DELTASONE) 10 MG tablet Take 10 mg by mouth daily.     . sodium bicarbonate 650 MG tablet Take 2 tablets (1,300 mg total) by mouth 2 (two) times daily. 120 tablet 1  . tamsulosin (FLOMAX) 0.4 MG CAPS capsule TAKE TWO CAPSULES BY MOUTH DAILY (Patient taking differently: Take 0.8 mg by mouth daily.) 90 capsule 0  . triamcinolone ointment (KENALOG) 0.1 % Apply 1 application topically 2 (two) times daily. 80 g 0  . VELTASSA 8.4 g packet Take 1 packet by mouth daily.     No current facility-administered medications for this visit.    Allergies as of 12/19/2020 - Review Complete 12/19/2020  Allergen Reaction Noted  . Aspirin Other (See Comments) 08/24/2014    Family History  Problem Relation Age of Onset  . Hypertension Mother        died age 41  . Diabetes Mother     Social History   Socioeconomic History  . Marital status: Single    Spouse name: Not on file  . Number of children: Not on file  . Years of education: Not on file  . Highest education level: Not on file  Occupational History  . Not on file  Tobacco Use  . Smoking status: Former Smoker    Years: 66.00    Types: Cigarettes    Start date: 06/23/2017  . Smokeless tobacco: Never Used  Vaping Use  . Vaping Use: Never used  Substance and Sexual Activity  . Alcohol use: No    Alcohol/week: 0.0 standard drinks    Comment: 04/17/2016 "nothing in the last 3-4 years"  . Drug use: No  . Sexual activity: Never  Other Topics  Concern  . Not on file  Social History Narrative   Works in the yard      [place] Strain: [place]   . Difficulty of Paying Living Expenses: Not very hard  Food Insecurity: No Food Insecurity  . Worried About Charity fundraiser in the Last Year: Never true  . Ran Out of Food in the Last Year: Never true  Transportation Needs: No Transportation Needs  . Lack of Transportation (Medical): No  . Lack of Transportation (Non-Medical): No  Physical Activity: Not on file  Stress: Not on file  Social Connections: Not on file  Intimate Partner Violence: Not on file  Physical Exam: BP (!) 144/66   Pulse 70   Ht $R'6\' 2"'En$  (1.88 m)   Wt 204 lb (92.5 kg)   BMI 26.19 kg/m  Constitutional: generally well-appearing Psychiatric: alert and oriented x3 Eyes: extraocular movements intact Mouth: oral pharynx moist, no lesions: He only has 5 teeth in his mouth, they are all on the bottom incisors Neck: supple no lymphadenopathy Cardiovascular: heart regular rate and rhythm Lungs: clear to auscultation bilaterally Abdomen: soft, nontender, nondistended, no obvious ascites, no peritoneal signs, normal bowel sounds Extremities: no lower extremity edema bilaterally Skin: no lesions on visible extremities   Assessment and plan: 80 y.o. male with chronic intermittent solid food dysphagia, abnormal barium esophagram, on blood thinner chronically  First he has not been losing weight and the barium esophagram does not look too ominous and so I think it is unlikely that he has cancer causing his dysphagia.  He is nearly edentulous, has no molars or any teeth in his upper jaw at all.  I explained to him and his niece that it is very hard to swallow normally if he can chew normally and it is very hard to chew normally if you have only for 5 teeth.  I recommended that they start working on getting dentures for him.  I think would be safest to also proceed with EGD  at his soonest convenience to exclude significant neoplasia.  Check for Candida esophagitis or significant GERD related disease.  In the meantime he is going to start taking a single omeprazole 40 mg pill shortly for breakfast every morning.  He is on a blood thinner for previous history of stroke and knows that he will need to stop it for 2 days prior to his upper endoscopy and we will reach out to his primary care physician who prescribes his medicine to make sure they are okay with that recommendation.  Please see the "Patient Instructions" section for addition details about the plan.   Derrick Loffler, MD [facility] 12/19/2020, 10:15 AM  Cc: Derrick Rosenthal, MD  Total time on date of encounter was 45 minutes (this included time spent preparing to see the patient reviewing records; obtaining and/or reviewing separately obtained history; performing a medically appropriate exam and/or evaluation; counseling and educating the patient and family if present; ordering medications, tests or procedures if applicable; and documenting clinical information in the health record).

## 2020-12-19 NOTE — Patient Instructions (Addendum)
Medication Instructions:  Your physician recommends that you continue on your current medications as directed. Please refer to the Current Medication list given to you today.  Labwork: None ordered.  Testing/Procedures: None ordered.   Your physician wants you to follow-up in: 6 months with Renee Ursuy.      Implantable Loop Recorder Removal, Care After This sheet gives you information about how to care for yourself after your procedure. Your health care provider may also give you more specific instructions. If you have problems or questions, contact your health care provider. What can I expect after the procedure? After the procedure, it is common to have:  Soreness or discomfort near the incision.  Some swelling or bruising near the incision.  Follow these instructions at home: Incision care  1.  Leave your outer dressing on for 24 hours.  After 24 hours you can remove your outer dressing and shower. 2. Leave adhesive strips in place. These skin closures may need to stay in place for 1-2 weeks. If adhesive strip edges start to loosen and curl up, you may trim the loose edges.  You may remove the strips if they have not fallen off after 2 weeks. 3. Check your incision area every day for signs of infection. Check for: a. Redness, swelling, or pain. b. Fluid or blood. c. Warmth. d. Pus or a bad smell. 4. Do not take baths, swim, or use a hot tub until your incision is completely healed. 5. If your wound site starts to bleed apply pressure.      If you have any questions/concerns please call the device clinic at [phone].  Activity  Return to your normal activities.  Contact a health care provider if:  You have redness, swelling, or pain around your incision.  You have a fever.  

## 2020-12-20 NOTE — Telephone Encounter (Signed)
Left message for patient's niece [name] to return call to discuss instructions on Eliquis hold prior to endoscopy.  Will continue efforts.

## 2020-12-21 NOTE — Telephone Encounter (Signed)
Left message for patient's niece [name] to return call to discuss instructions on Eliquis hold prior to endoscopy.  Will continue efforts.

## 2020-12-21 NOTE — Telephone Encounter (Signed)
   Primary Cardiologist: Thompson Grayer, MD  Chart reviewed as part of pre-operative protocol coverage.   Per pharmacy and Dr. Jackalyn Lombard recommendations, patient can hold eliquis 1 day prior to surgery with plans to restart when cleared to do so by his gastroenterologist. Please note this recommendation is different than the requested hold time.   I will route this recommendation to the requesting party via Epic fax function and remove from pre-op pool.  Please call with questions.  Abigail Butts, PA-C 12/21/2020, 10:16 AM

## 2020-12-21 NOTE — Telephone Encounter (Signed)
Per Dr. Rayann Heman office visit notes from 12/19/20 patient to hold Eliquis only 24 hr prior to procedure.

## 2020-12-24 NOTE — Telephone Encounter (Signed)
[name] notified to have Mr Derrick Hendrix hold Eliquis x 1 day prior to colonoscopy scheduled for 01-21-2021.  Patient will take last dose of Eliquis on the evening on 01-19-2021, and Dr Ardis Hughs will advise her when to restart after procedure.  [name] agreed to plan and verbalized understanding.  No further questions.

## 2020-12-27 ENCOUNTER — Other Ambulatory Visit: Payer: Self-pay | Admitting: Internal Medicine

## 2020-12-27 DIAGNOSIS — I10 Essential (primary) hypertension: Secondary | ICD-10-CM

## 2020-12-31 ENCOUNTER — Encounter (HOSPITAL_COMMUNITY): Payer: Medicare HMO

## 2021-01-01 ENCOUNTER — Other Ambulatory Visit: Payer: Self-pay | Admitting: Internal Medicine

## 2021-01-01 DIAGNOSIS — I10 Essential (primary) hypertension: Secondary | ICD-10-CM

## 2021-01-07 ENCOUNTER — Other Ambulatory Visit: Payer: Self-pay

## 2021-01-07 ENCOUNTER — Encounter (HOSPITAL_COMMUNITY)
Admission: RE | Admit: 2021-01-07 | Discharge: 2021-01-07 | Disposition: A | Discharge status: Home / Self Care | Payer: Medicare HMO | Source: Ambulatory Visit | Attending: Nephrology | Admitting: Nephrology

## 2021-01-07 VITALS — BP 90/59 | HR 96 | Temp 98.9°F | Resp 18

## 2021-01-07 DIAGNOSIS — D631 Anemia in chronic kidney disease: Secondary | ICD-10-CM | POA: Diagnosis not present

## 2021-01-07 DIAGNOSIS — N184 Chronic kidney disease, stage 4 (severe): Secondary | ICD-10-CM

## 2021-01-07 LAB — POCT HEMOGLOBIN-HEMACUE: Hemoglobin: 10.1 g/dL — ABNORMAL LOW (ref 13.0–17.0)

## 2021-01-07 MED ORDER — EPOETIN ALFA 10000 UNIT/ML IJ SOLN: 10000.0000 [IU] | INTRAMUSCULAR | Status: DC

## 2021-01-07 MED ORDER — EPOETIN ALFA 10000 UNIT/ML IJ SOLN
INTRAMUSCULAR | Status: AC
  Administered 2021-01-07: 10000 [IU] via SUBCUTANEOUS
  Filled 2021-01-07: qty 1

## 2021-01-08 ENCOUNTER — Other Ambulatory Visit: Payer: Self-pay | Admitting: Student

## 2021-01-08 ENCOUNTER — Other Ambulatory Visit: Payer: Self-pay | Admitting: Internal Medicine

## 2021-01-08 DIAGNOSIS — R339 Retention of urine, unspecified: Secondary | ICD-10-CM

## 2021-01-08 DIAGNOSIS — I10 Essential (primary) hypertension: Secondary | ICD-10-CM

## 2021-01-11 ENCOUNTER — Other Ambulatory Visit: Payer: Medicare HMO | Admitting: Hospice

## 2021-01-11 ENCOUNTER — Other Ambulatory Visit: Payer: Self-pay

## 2021-01-11 ENCOUNTER — Other Ambulatory Visit: Payer: Medicare HMO | Admitting: Internal Medicine

## 2021-01-14 ENCOUNTER — Encounter (HOSPITAL_COMMUNITY): Payer: Medicare HMO

## 2021-01-14 ENCOUNTER — Ambulatory Visit: Payer: Medicare HMO | Admitting: *Deleted

## 2021-01-14 DIAGNOSIS — I63532 Cerebral infarction due to unspecified occlusion or stenosis of left posterior cerebral artery: Secondary | ICD-10-CM

## 2021-01-14 DIAGNOSIS — I10 Essential (primary) hypertension: Secondary | ICD-10-CM

## 2021-01-14 DIAGNOSIS — N184 Chronic kidney disease, stage 4 (severe): Secondary | ICD-10-CM

## 2021-01-14 DIAGNOSIS — I480 Paroxysmal atrial fibrillation: Secondary | ICD-10-CM

## 2021-01-14 DIAGNOSIS — N183 Chronic kidney disease, stage 3 unspecified: Secondary | ICD-10-CM

## 2021-01-14 NOTE — Chronic Care Management (AMB) (Signed)
Chronic Care Management   CCM RN Visit Note  01/14/2021 Name: Derrick Hendrix. MRN: [id number] DOB: December 12, 1941  Subjective: Derrick Hendrix. is a 80 y.o. year old male who is a primary care patient of [name], [name], MD. The care management team was consulted for assistance with disease management and care coordination needs.    Engaged with patient by telephone for follow up visit in response to provider referral for case management and/or care coordination services.   Consent to Services:  The patient was given information about Chronic Care Management services, agreed to services, and gave verbal consent prior to initiation of services.  Please see initial visit note for detailed documentation.   Patient agreed to services and verbal consent obtained.   Assessment: Review of patient past medical history, allergies, medications, health status, including review of consultants reports, laboratory and other test data, was performed as part of comprehensive evaluation and provision of chronic care management services.   SDOH (Social Determinants of Health) assessments and interventions performed:    CCM Care Plan  Allergies  Allergen Reactions  . Aspirin Other (See Comments)    Nosebleeds     Outpatient Encounter Medications as of 01/14/2021  Medication Sig  . amLODipine (NORVASC) 10 MG tablet TAKE ONE TABLET BY MOUTH DAILY (Patient taking differently: Take 10 mg by mouth daily.)  . atorvastatin (LIPITOR) 80 MG tablet TAKE ONE TABLET BY MOUTH DAILY (Patient taking differently: Take 80 mg by mouth daily.)  . BISACODYL 5 MG EC tablet TAKE 1 TABLET (5 MG TOTAL) BY MOUTH DAILY AS NEEDED FOR MODERATE CONSTIPATION. (Patient taking differently: Take 5 mg by mouth daily as needed for mild constipation.)  . Blood Pressure Monitoring (BLOOD PRESSURE KIT) DEVI Please check blood pressure twice a day. In the morning and evening. Please record numbers on a sheet of paper. Blood pressure parameters  are 120-150/80-90  . calcitRIOL (ROCALTROL) 0.25 MCG capsule Take 0.25 mcg by mouth daily.  . clobetasol cream (TEMOVATE) 0.05 % APPLY TOPICALLY TWO (TWO) TIMES DAILY. (Patient taking differently: Apply 1 application topically 2 (two) times daily.)  . docusate sodium (COLACE) 100 MG capsule Take 1 capsule (100 mg total) by mouth daily.  [name] ELIQUIS 2.5 MG TABS tablet TAKE ONE TABLET BY MOUTH TWICE A DAY  . finasteride (PROSCAR) 5 MG tablet TAKE ONE TABLET BY MOUTH DAILY  . furosemide (LASIX) 40 MG tablet TAKE ONE TABLET BY MOUTH TWICE A DAY  . hydrALAZINE (APRESOLINE) 10 MG tablet Take 1.5 tablets (15 mg total) by mouth 3 (three) times daily.  . nitroGLYCERIN (NITROSTAT) 0.4 MG SL tablet Place 1 tablet (0.4 mg total) under the tongue every 5 (five) minutes x 3 doses as needed for chest pain.  [name] omeprazole (PRILOSEC) 40 MG capsule Take one capsule shortly before breakfast each morning  . predniSONE (DELTASONE) 10 MG tablet Take 10 mg by mouth daily.   . sodium bicarbonate 650 MG tablet Take 2 tablets (1,300 mg total) by mouth 2 (two) times daily.  . tamsulosin (FLOMAX) 0.4 MG CAPS capsule Take 2 capsules (0.8 mg total) by mouth daily.  [name] triamcinolone ointment (KENALOG) 0.1 % Apply 1 application topically 2 (two) times daily.  . VELTASSA 8.4 g packet Take 1 packet by mouth daily.   No facility-administered encounter medications on file as of 01/14/2021.    Patient Active Problem List   Diagnosis Date Noted  . Goals of care, counseling/discussion 11/30/2020  . Rhinorrhea 11/29/2020  . Dysphagia 08/23/2020  .  Psoriasis 04/23/2020  . Acute on chronic renal failure ([place]) 03/03/2020  . Acute kidney injury superimposed on chronic kidney disease ([place])   . Normal anion gap metabolic acidosis   . Epistaxis 02/20/2020  . Atrial fibrillation ([place]) 10/14/2018  . Type 2 diabetes mellitus ([place]) 09/04/2017  . Hemiballism 08/31/2017  . Ischemic pain of foot, right   . Severe peripheral arterial disease  ([place]) 02/27/2017  . Cerebellar infarct ([place]) 05/06/2016  . Benign prostatic hyperplasia with urinary obstruction   . Orthostatic hypotension 04/04/2016  . Urinary retention 03/20/2016  . Cerebrovascular accident (CVA) due to occlusion of left posterior cerebral artery ([place])   . Dizziness 03/07/2016  . Essential hypertension   . Normocytic anemia 10/20/2014  . Hyporeninemic hypoaldosteronism ([place]) 08/24/2014  . Hyperkalemia 08/01/2014  . Healthcare maintenance 07/13/2014  . Atherosclerosis of leg with intermittent claudication, R 06/29/2014  . Current smoker 06/27/2014  . Alcohol use ([place]) 06/27/2014  . CKD (chronic kidney disease), stage IV ([place]) 06/26/2014    Conditions to be addressed/monitored:NIDDM, HTN, HLD, ESRD, A fib, s/p CVA with left sided spasticity  Care Plan : Level of care concerns  Updates made by Barrington Ellison, RN since 01/14/2021 12:00 AM    Problem: Patient needs assitance with ADLS and IADLS.   Priority: High  Onset Date: 11/19/2020    Goal: Self-Management Plan Developed   Start Date: 11/19/2020  Expected End Date: 03/14/2021  Recent Progress: On track  Priority: High  Note:   Current Barriers:  . Care Coordination needs related to alternate housing and/or community assistance in a patient with NIDDM, HTN, HLD, CKD, A fib, s/p CVA with left sided spasticity - another long discussion with patient via phone to address ADL/IADL deficits and to offer options for assistance and to discuss upcoming palliative care consult on 01/22/21, patient says he canceled the palliative care consult that was scheduled for 01/11/21 because he didn't feel well, at end of discussion patient states he thinks he is ready to move to another place where he can receive more help with ADls and IADLs. . Dr. Nathanial Rancher discussed Palliative Care with patient and niece on 11/30/20.  Patient consented to referral for services.  . Unable to perform ADLs independently . Unable to perform IADLs  independently  Nurse Case Manager Clinical Goal(s):  [name] Over the next 30-60 days, patient will work with CCM clinical social worker to investigate PACE and assisted living facilities  Interventions:  . Inter-disciplinary care team collaboration (see longitudinal plan of care) . Discussed patient current clinical status related to ESRD and not a candidate for dialysis and thus the reason for palliative care referral  . Reinforced importance of keeping Palliative Care Consult on 01/22/21 . Discussed housing options with patient and encouraged him to discuss with his niece [name] and notify this CCM RN if he wishes to transition to ALF and/or rest home . Left message for patient's niece [name] requesting a return call so she can be updated on the information shared with patient today during CCM call   Patient Goals/Self-Care Activities Over the next 30-60 days, patient will:  - work with CCM team to discuss alternative housing options and palliative care  Follow Up Plan: The care management team will reach out to the patient again over the next 30-60 days.       Care Plan : Patient needs assistance with establishing new  podiatrist as he is unable to perform nail care  Updates made by Barrington Ellison,  RN since 01/14/2021 12:00 AM  Completed 01/14/2021  Problem: Disease Progression (Diabetes, Type 2) Resolved 01/14/2021  Priority: Medium  Onset Date: 11/19/2020  Note:   Current Barriers:  . Care Coordination needs related to foot care in a patient with NIDDM, HTN, HLD, CKD, A fib, s/p CVA with left sided spasticity  . Unable to independently secure new podiatrist- patient states he cannot cut his toenails but had a bad experience with a previous podiatrist so is seeking to establish with a new one  Nurse Case Manager Clinical Goal(s):  [name] Over the next 30 days, patient will meet with RN Care Manager to address establishing a new podiatrist  Interventions:  . Inter-disciplinary care team  collaboration (see longitudinal plan of care) . Asked clinic provider for referral to podiatrist . At patient's request provided names of local podiatrist avoiding prior podiatrist to assist patient in securing appointment with new podiatrist  Ensure completion of annual comprehensive foot exam and dilated eye exam.    Implement additional individualized goals and interventions based on identified risk factors.   Prepare patient for referral for specialist care; podiatry  Provided update to patient's niece [name] via her voice mail and advised her this CCM RN will mail her the names of local podiatrist to assist patient with establishing care  Completing care plan- Patient has podiatry appointment on 01/21/21  Patient Goals/Self-Care Activities Over the next 30 days, patient will:  -  make and keep appointment with new podiatrist to complete nail and foot care  Follow Up Plan: The care management team will reach out to the patient again over the next 30-60 days.          Kelli Churn RN, CCM, [facility] RN Care Manager [phone]

## 2021-01-14 NOTE — Patient Instructions (Signed)
Visit Information It was nice speaking with you today.  Patient Care Plan: Level of care concerns    Problem Identified: Patient needs assitance with ADLS and IADLS.   Priority: High  Onset Date: 11/19/2020    Goal: Self-Management Plan Developed   Start Date: 11/19/2020  Expected End Date: 03/14/2021  Recent Progress: On track  Priority: High  Note:   Current Barriers:  . Care Coordination needs related to alternate housing and/or community assistance in a patient with NIDDM, HTN, HLD, CKD, A fib, s/p CVA with left sided spasticity - another long discussion with patient via phone to address ADL/IADL deficits and to offer options for assistance and to discuss upcoming palliative care consult on 01/22/21, patient says he canceled the palliative care consult that was scheduled for 01/11/21 because he didn't feel well, at end of discussion patient states he thinks he is ready to move to another place where he can receive more help with ADls and IADLs. . Dr. Nathanial Rancher discussed Palliative Care with patient and niece on 11/30/20.  Patient consented to referral for services.  . Unable to perform ADLs independently . Unable to perform IADLs independently  Nurse Case Manager Clinical Goal(s):  [name] Over the next 30-60 days, patient will work with CCM clinical social worker to investigate PACE and assisted living facilities  Interventions:  . Inter-disciplinary care team collaboration (see longitudinal plan of care) . Discussed patient current clinical status related to ESRD and not a candidate for dialysis and thus the reason for palliative care referral  . Reinforced importance of keeping Palliative Care Consult on 01/22/21 . Discussed housing options with patient and encouraged him to discuss with his niece [name] and notify this CCM RN if he wishes to transition to ALF and/or rest home . Left message for patient's niece [name] requesting a return call so she can be updated on the information shared  with patient today during CCM call   Patient Goals/Self-Care Activities Over the next 30-60 days, patient will:  - work with CCM team to discuss alternative housing options and palliative care  Follow Up Plan: The care management team will reach out to the patient again over the next 30-60 days.       Patient Care Plan: Patient needs assistance with establishing new  podiatrist as he is unable to perform nail care  Completed 01/14/2021  Problem Identified: Disease Progression (Diabetes, Type 2) Resolved 01/14/2021  Priority: Medium  Onset Date: 11/19/2020  Note:   Current Barriers:  . Care Coordination needs related to foot care in a patient with NIDDM, HTN, HLD, CKD, A fib, s/p CVA with left sided spasticity  . Unable to independently secure new podiatrist- patient states he cannot cut his toenails but had a bad experience with a previous podiatrist so is seeking to establish with a new one  Nurse Case Manager Clinical Goal(s):  [name] Over the next 30 days, patient will meet with RN Care Manager to address establishing a new podiatrist  Interventions:  . Inter-disciplinary care team collaboration (see longitudinal plan of care) . Asked clinic provider for referral to podiatrist . At patient's request provided names of local podiatrist avoiding prior podiatrist to assist patient in securing appointment with new podiatrist  Ensure completion of annual comprehensive foot exam and dilated eye exam.    Implement additional individualized goals and interventions based on identified risk factors.   Prepare patient for referral for specialist care; podiatry  Provided update to patient's niece [name] via her  voice mail and advised her this CCM RN will mail her the names of local podiatrist to assist patient with establishing care  Patient has podiatry appointment on 01/21/21  Patient Goals/Self-Care Activities Over the next 30 days, patient will:  -  make and keep appointment with new  podiatrist to complete nail and foot care  Follow Up Plan: The care management team will reach out to the patient again over the next 30-60 days.         The patient verbalized understanding of instructions, educational materials, and care plan provided today and declined offer to receive copy of patient instructions, educational materials, and care plan.   The care management team will reach out to the patient again over the next 30-60 days.   Kelli Churn RN, CCM, [facility] RN Care Manager [phone]

## 2021-01-15 NOTE — Progress Notes (Signed)
Internal Medicine Clinic Resident  I have personally reviewed this encounter including the documentation in this note and/or discussed this patient with the care management provider. I will address any urgent items identified by the care management provider and will communicate my actions to the patient's PCP. I have reviewed the patient's CCM visit with my supervising attending, Dr Evette Doffing.  Dewayne Hatch, MD 01/15/2021

## 2021-01-15 NOTE — Progress Notes (Signed)
Internal Medicine Clinic Attending  CCM services provided by the care management provider and their documentation were discussed with Dr. Myrtie Hawk. We reviewed the pertinent findings, urgent action items addressed by the resident and non-urgent items to be addressed by the PCP.  I agree with the assessment, diagnosis, and plan of care documented in the CCM and resident's note.  Axel Filler, MD 01/15/2021

## 2021-01-17 ENCOUNTER — Telehealth: Payer: Self-pay | Admitting: Gastroenterology

## 2021-01-17 NOTE — Telephone Encounter (Signed)
Hi Dr. Ardis Hughs., this patient's niece [name] just called to cancel pt's procedure that was scheduled next Monday 01/21/21. She stated that pt does not wish to undergo procedure at this moment. Thank you.

## 2021-01-21 ENCOUNTER — Encounter: Payer: Medicare HMO | Admitting: Gastroenterology

## 2021-01-21 ENCOUNTER — Telehealth: Payer: Self-pay | Admitting: Student

## 2021-01-21 ENCOUNTER — Ambulatory Visit: Payer: Medicare HMO | Admitting: *Deleted

## 2021-01-21 DIAGNOSIS — M21622 Bunionette of left foot: Secondary | ICD-10-CM | POA: Diagnosis not present

## 2021-01-21 DIAGNOSIS — M792 Neuralgia and neuritis, unspecified: Secondary | ICD-10-CM | POA: Diagnosis not present

## 2021-01-21 DIAGNOSIS — N183 Chronic kidney disease, stage 3 unspecified: Secondary | ICD-10-CM

## 2021-01-21 DIAGNOSIS — I480 Paroxysmal atrial fibrillation: Secondary | ICD-10-CM

## 2021-01-21 DIAGNOSIS — I63532 Cerebral infarction due to unspecified occlusion or stenosis of left posterior cerebral artery: Secondary | ICD-10-CM

## 2021-01-21 DIAGNOSIS — N184 Chronic kidney disease, stage 4 (severe): Secondary | ICD-10-CM

## 2021-01-21 DIAGNOSIS — E1122 Type 2 diabetes mellitus with diabetic chronic kidney disease: Secondary | ICD-10-CM

## 2021-01-21 DIAGNOSIS — M21621 Bunionette of right foot: Secondary | ICD-10-CM | POA: Diagnosis not present

## 2021-01-21 DIAGNOSIS — I10 Essential (primary) hypertension: Secondary | ICD-10-CM

## 2021-01-21 DIAGNOSIS — I739 Peripheral vascular disease, unspecified: Secondary | ICD-10-CM | POA: Diagnosis not present

## 2021-01-21 DIAGNOSIS — L84 Corns and callosities: Secondary | ICD-10-CM | POA: Diagnosis not present

## 2021-01-21 DIAGNOSIS — B351 Tinea unguium: Secondary | ICD-10-CM | POA: Diagnosis not present

## 2021-01-21 NOTE — Chronic Care Management (AMB) (Signed)
   01/21/2021  Derrick Hendrix. 09/02/1941 [id number]  Returned call to patient's niece, Jaran Sainz in response to message she left for this CCM RN at 3:01 pm. Tracey's message informed this CCM RN that patient did complete a podiatrist visit today and had his toenails trimmed. However, [name] states  patient is refusing Palliative Care assessment scheduled for tomorrow morning because he does not want anyone to come in his house due to its poor condition.   Plan:  Left message for [name] suggesting she asked if Palliative Care can complete the assessment via telehealth and thanking her for the update r/t podiatrist visit.  Kelli Churn RN, CCM, [facility] RN Care Manager [phone]

## 2021-01-21 NOTE — Telephone Encounter (Signed)
Rec'd call from patient's niece, [name], and she said that the patient does not want an in-home visit he would prefer a Telephone Consult.  Patient has a Palliative Consult scheduled for 01/22/21 @ 9 AM.  I have notified NP.

## 2021-01-22 ENCOUNTER — Other Ambulatory Visit: Payer: Self-pay

## 2021-01-22 ENCOUNTER — Other Ambulatory Visit: Payer: Medicare HMO | Admitting: Student

## 2021-01-22 DIAGNOSIS — N185 Chronic kidney disease, stage 5: Secondary | ICD-10-CM

## 2021-01-22 DIAGNOSIS — Z515 Encounter for palliative care: Secondary | ICD-10-CM | POA: Diagnosis not present

## 2021-01-22 NOTE — Progress Notes (Signed)
Internal Medicine Clinic Resident  I have personally reviewed this encounter including the documentation in this note and/or discussed this patient with the care management provider. I will address any urgent items identified by the care management provider and will communicate my actions to the patient's PCP. I have reviewed the patient's CCM visit with my supervising attending, Dr Evette Doffing.  Patient's request for his upcoming palliative care visit to not be completed at his residence is reasonable. Agree with plan to complete appointment on 01/22/21 virtually per palliative care documentation.  Foy Guadalajara, MD 01/22/2021

## 2021-01-22 NOTE — Progress Notes (Signed)
[facility] Consult Note Telephone: [phone]  Fax: [phone]  PATIENT NAME: Derrick Hendrix [address] [place] [address] [phone] (home)  DOB: 1941/03/12 MRN: [id number]  PRIMARY CARE PROVIDER:    [name], MD,  [address] [address] [phone]  REFERRING PROVIDER:   [name], MD [address] [place],  [place] [address] [phone]  RESPONSIBLE PARTY:   Extended Emergency Contact Information Primary Emergency Contact: Alvester Chou Address: [address]          [place], [place] [address] Mobile Phone: [phone] Relation: Niece Interpreter needed? No Secondary Emergency Contact: Lauris Chroman, [place] Phone: [phone] Mobile Phone: [phone] Relation: Sister   Due to the COVID-19 crisis, this home visit was done via telephone due to the patient's inability to connect via an audiovisual connection or their refusal to have an in-person visit. This  connection method was agreed to by the patient/family.    ASSESSMENT AND RECOMMENDATIONS:   Advance Care Planning: Visit at the request of Dr. Eileen Stanford for palliative consult. Visit consisted of building trust and discussions on Palliative Medicine as specialized medical care for people living with serious illness, aimed at facilitating better quality of life through symptoms relief, assisting with advance care plan and establishing goals of care. Palliative care will continue to provide support to patient, family and the medical team. Education provided on patient's CKD and inability to receive dialysis. Discussed difference between Palliative and Hospice. Patient would like for MOST form to be reviewed and filled out with his niece Blayze Haen.   Goal of care: To maintain his current level of care, be comfortable.   Directives: None currently on file; patient  directs NP to discuss with niece.  Symptom Management:   CKD-patient's most recent GFR is 12 per records, stage 5 CKD. Patient is ineligible to receive dialysis per patient and records. Education provided on CKD and hospice eligibility requirements with niece [name]. Palliative Medicine will continue to provide support and assess for further changes and declines.  Constipation-Continue bisacodyl 5mg  PO daily prn.  Sleep difficulty-patient declines intervention at this time. Will continue to monitor and make recommendations as needed.   Follow up Palliative Care Visit: Palliative care will continue to follow for goals of care clarification and symptom management. Return 4 weeks or prn.  Family /Caregiver/Community Supports: Palliative Medicine will continue to provide emotional support to patient and niece.   Cognitive / Functional decline: Patient would benefit from higher level of care for additional adl support, medication management. [name] is to speak with SW Amber with Internal Medicine regarding long range planning.   I spent 45 minutes providing this consultation,  from 9:00am to 9:45 am. More than 50% of the time in this consultation was spent in counseling and care coordination  History obtained from review of EMR, discussion with patient and  interview with family Records reviewed and summarized below.   CHIEF COMPLAINT: Initiate Palliative Medicine   HISTORY OF PRESENT ILLNESS:  Derrick Hendrix. is a 80 y.o. year old male with multiple medical problems including CKD IV-V, hypertension, hyperlipidemia, atrial fibrillation, s/p CVA with left sided spasticity. Palliative Care was asked to follow this patient by consultation request of [name], MD to help address advance care planning and goals of care.This is an initial consult. [name] reports that he  has been doing okay overall. He lives at home alone, niece [name] runs errands for patient, takes to appointments and  checks on him several times a week. He states he has "shaking" to his left side as a result of previous CVA. He has a walking stick that he uses regularly, walker and a wheel chair. He is able to shower himself, dress, cook for himself. He endorses a good appetite; denies any recent weight loss. He receives his medication in pill packs; he states he is taking them as directed although [name] states he will miss doses and does not take his Veltassa as directed. [name] reports patient needing more assistance and home is in very ill repair, and house should probably be condemned. She states patient has 4 cats in the home that he cares for that he does not want to leave them. Mr. Delarosa denies any chest pain, shortness of breath, fatigue. He does report feeling dizzy when he bends over. No headache, nausea, diarrhea. He endorses constipation. He also states he is not sleeping well at night, but declines any further intervention. He was seen by podiatrist yesterday.   CODE STATUS: DNR  PPS: 50%  HOSPICE ELIGIBILITY/DIAGNOSIS: TBD  ROS:   General: NAD Cardiovascular: denies chest pain, palpitations,  edema  Pulmonary: no cough, no increased SOB, on room air GI: Endorses fair appetite, endorses constipation, continent of bowel GU: denies dysuria, continent of urine MSK: ambulatory with walking stick Skin: reports itching Neurological: Alert and oriented x 3 Psych: non -anxious affect  PE: physical exam deferred  PAST MEDICAL HISTORY:  Past Medical History:  Diagnosis Date  . Abscess of foot without toes, left 08/28/2017  . Chronic kidney disease (CKD), stage III (moderate) (HCC)   . Constipation 11/07/2014  . Daily headache    "lately" (04/17/2016)  . Depression   . Dizziness 03/07/2016  . Headache 06/26/2014  . Heart murmur   . Hyperkalemia 10/05/2018  . Hyperlipemia 11/11/2016  . Hypertension   . Neuropathic pain 02/06/2015  . Osteomyelitis ([facility])   . PAD (peripheral artery disease) ([facility])    . Paronychia of great toe, right   . Preop cardiovascular exam   . Right foot pain 02/09/2017  . Stable angina ([facility]) 06/27/2014  . Stroke ([facility]) 03/2016   hx of PAD; j"ust lots of headaches since" (04/17/2016)  . Vitamin B12 deficiency 06/27/2014  . Weight loss, unintentional 06/27/2014    SOCIAL HX:  Social History   Tobacco Use  . Smoking status: Former Smoker    Years: 66.00    Types: Cigarettes    Start date: 06/23/2017  . Smokeless tobacco: Never Used  Substance Use Topics  . Alcohol use: No    Alcohol/week: 0.0 standard drinks    Comment: 04/17/2016 "nothing in the last 3-4 years"   FAMILY HX:  Family History  Problem Relation Age of Onset  . Hypertension Mother        died age 2  . Diabetes Mother     ALLERGIES:  Allergies  Allergen Reactions  . Aspirin Other (See Comments)    Nosebleeds      PERTINENT MEDICATIONS:  Outpatient Encounter Medications as of 01/22/2021  Medication Sig  . amLODipine (NORVASC) 10 MG tablet TAKE ONE TABLET BY MOUTH DAILY (Patient taking differently: Take 10 mg by mouth daily.)  . atorvastatin (LIPITOR) 80 MG tablet TAKE ONE TABLET BY MOUTH DAILY (Patient taking differently: Take 80 mg by mouth daily.)  . BISACODYL 5 MG EC tablet TAKE  1 TABLET (5 MG TOTAL) BY MOUTH DAILY AS NEEDED FOR MODERATE CONSTIPATION. (Patient taking differently: Take 5 mg by mouth daily as needed for mild constipation.)  . Blood Pressure Monitoring (BLOOD PRESSURE KIT) DEVI Please check blood pressure twice a day. In the morning and evening. Please record numbers on a sheet of paper. Blood pressure parameters are 120-150/80-90  . calcitRIOL (ROCALTROL) 0.25 MCG capsule Take 0.25 mcg by mouth daily.  . clobetasol cream (TEMOVATE) 0.05 % APPLY TOPICALLY TWO (TWO) TIMES DAILY. (Patient taking differently: Apply 1 application topically 2 (two) times daily.)  . docusate sodium (COLACE) 100 MG capsule Take 1 capsule (100 mg total) by mouth daily.  [name] ELIQUIS 2.5 MG TABS tablet  TAKE ONE TABLET BY MOUTH TWICE A DAY  . finasteride (PROSCAR) 5 MG tablet TAKE ONE TABLET BY MOUTH DAILY  . furosemide (LASIX) 40 MG tablet TAKE ONE TABLET BY MOUTH TWICE A DAY  . hydrALAZINE (APRESOLINE) 10 MG tablet Take 1.5 tablets (15 mg total) by mouth 3 (three) times daily.  . nitroGLYCERIN (NITROSTAT) 0.4 MG SL tablet Place 1 tablet (0.4 mg total) under the tongue every 5 (five) minutes x 3 doses as needed for chest pain.  [name] omeprazole (PRILOSEC) 40 MG capsule Take one capsule shortly before breakfast each morning  . predniSONE (DELTASONE) 10 MG tablet Take 10 mg by mouth daily.   . tamsulosin (FLOMAX) 0.4 MG CAPS capsule Take 2 capsules (0.8 mg total) by mouth daily.  [name] triamcinolone ointment (KENALOG) 0.1 % Apply 1 application topically 2 (two) times daily.  . VELTASSA 8.4 g packet Take 1 packet by mouth daily.   No facility-administered encounter medications on file as of 01/22/2021.     Thank you for the opportunity to participate in the care of Mr. Bhagat. The palliative care team will continue to follow. Please call our office at [phone] if we can be of additional assistance.  Ezekiel Slocumb, NP , AGPCNP-C

## 2021-01-25 ENCOUNTER — Ambulatory Visit (HOSPITAL_COMMUNITY)
Admission: RE | Admit: 2021-01-25 | Discharge: 2021-01-25 | Disposition: A | Discharge status: Home / Self Care | Payer: Medicare HMO | Source: Ambulatory Visit | Attending: Nephrology | Admitting: Nephrology

## 2021-01-25 ENCOUNTER — Other Ambulatory Visit: Payer: Self-pay

## 2021-01-25 VITALS — HR 86 | Temp 97.8°F | Resp 16

## 2021-01-25 DIAGNOSIS — N184 Chronic kidney disease, stage 4 (severe): Secondary | ICD-10-CM | POA: Diagnosis present

## 2021-01-25 LAB — IRON AND TIBC
Iron: 83 ug/dL (ref 45–182)
Saturation Ratios: 27 % (ref 17.9–39.5)
TIBC: 311 ug/dL (ref 250–450)
UIBC: 228 ug/dL

## 2021-01-25 LAB — FERRITIN: Ferritin: 276 ng/mL (ref 24–336)

## 2021-01-25 LAB — POCT HEMOGLOBIN-HEMACUE: Hemoglobin: 10.4 g/dL — ABNORMAL LOW (ref 13.0–17.0)

## 2021-01-25 MED ORDER — EPOETIN ALFA 10000 UNIT/ML IJ SOLN
10000.0000 [IU] | INTRAMUSCULAR | Status: DC
Start: 2021-01-25 — End: 2021-01-26

## 2021-01-25 MED ORDER — EPOETIN ALFA 10000 UNIT/ML IJ SOLN
INTRAMUSCULAR | Status: AC
  Administered 2021-01-25: 10000 [IU] via SUBCUTANEOUS
  Filled 2021-01-25: qty 1

## 2021-01-31 DIAGNOSIS — N184 Chronic kidney disease, stage 4 (severe): Secondary | ICD-10-CM | POA: Diagnosis not present

## 2021-02-01 ENCOUNTER — Encounter (HOSPITAL_COMMUNITY)
Admission: RE | Admit: 2021-02-01 | Discharge: 2021-02-01 | Disposition: A | Discharge status: Home / Self Care | Payer: Medicare HMO | Source: Ambulatory Visit | Attending: Nephrology | Admitting: Nephrology

## 2021-02-01 ENCOUNTER — Other Ambulatory Visit: Payer: Self-pay

## 2021-02-01 VITALS — BP 170/82 | HR 75 | Temp 97.4°F | Resp 20

## 2021-02-01 DIAGNOSIS — N184 Chronic kidney disease, stage 4 (severe): Secondary | ICD-10-CM | POA: Diagnosis present

## 2021-02-01 LAB — POCT HEMOGLOBIN-HEMACUE: Hemoglobin: 10.3 g/dL — ABNORMAL LOW (ref 13.0–17.0)

## 2021-02-01 MED ORDER — EPOETIN ALFA 10000 UNIT/ML IJ SOLN
INTRAMUSCULAR | Status: AC
  Administered 2021-02-01: 10000 [IU] via SUBCUTANEOUS
  Filled 2021-02-01: qty 1

## 2021-02-01 MED ORDER — EPOETIN ALFA 10000 UNIT/ML IJ SOLN
10000.0000 [IU] | INTRAMUSCULAR | Status: DC
Start: 2021-02-01 — End: 2021-02-02

## 2021-02-04 ENCOUNTER — Ambulatory Visit: Payer: Medicare HMO | Admitting: *Deleted

## 2021-02-04 DIAGNOSIS — N183 Chronic kidney disease, stage 3 unspecified: Secondary | ICD-10-CM

## 2021-02-04 DIAGNOSIS — N184 Chronic kidney disease, stage 4 (severe): Secondary | ICD-10-CM

## 2021-02-04 DIAGNOSIS — I10 Essential (primary) hypertension: Secondary | ICD-10-CM

## 2021-02-04 DIAGNOSIS — I63532 Cerebral infarction due to unspecified occlusion or stenosis of left posterior cerebral artery: Secondary | ICD-10-CM

## 2021-02-04 DIAGNOSIS — I480 Paroxysmal atrial fibrillation: Secondary | ICD-10-CM

## 2021-02-04 NOTE — Chronic Care Management (AMB) (Signed)
   02/04/2021  Thane Age. 1941-06-14 [id number]  Returned call to patient's niece, [name],  after she left voice mail earlier today for this CCM RN requesting return call. [name] states patient's nephrologist has called and is requesting to see patient due to high K+ levels. Nephrologist asked about patient's adherence to [place] says patient told her he does not take it on the days he has to appointments. [name] also states patient does not want to see nephrologist because "they find something else wrong every time I go there." [name] reports that patient spoke with Palliative Care and completed the initial intake assessment via telephone but continues to refuse offer of help in the home.   Plan: Per patient and niece's request, will call patient tomorrow to complete follow up assessment and discuss goals of care.  Kelli Churn RN, CCM, [facility] RN Care Manager [phone]

## 2021-02-05 ENCOUNTER — Ambulatory Visit: Payer: Medicare HMO | Admitting: *Deleted

## 2021-02-05 DIAGNOSIS — N183 Chronic kidney disease, stage 3 unspecified: Secondary | ICD-10-CM

## 2021-02-05 DIAGNOSIS — I63532 Cerebral infarction due to unspecified occlusion or stenosis of left posterior cerebral artery: Secondary | ICD-10-CM

## 2021-02-05 DIAGNOSIS — I10 Essential (primary) hypertension: Secondary | ICD-10-CM

## 2021-02-05 DIAGNOSIS — N184 Chronic kidney disease, stage 4 (severe): Secondary | ICD-10-CM

## 2021-02-05 DIAGNOSIS — I480 Paroxysmal atrial fibrillation: Secondary | ICD-10-CM

## 2021-02-05 NOTE — Chronic Care Management (AMB) (Signed)
Chronic Care Management   CCM RN Visit Note  02/05/2021 Name: Derrick Hendrix. MRN: [id number] DOB: 11-Jan-1941  Subjective: Derrick Hendrix. is a 80 y.o. year old male who is a primary care patient of [name], [name], MD. The care management team was consulted for assistance with disease management and care coordination needs.    Engaged with patient by telephone for follow up visit in response to provider referral for case management and/or care coordination services.   Consent to Services:  The patient was given information about Chronic Care Management services, agreed to services, and gave verbal consent prior to initiation of services.  Please see initial visit note for detailed documentation.   Patient agreed to services and verbal consent obtained.   Assessment: Review of patient past medical history, allergies, medications, health status, including review of consultants reports, laboratory and other test data, was performed as part of comprehensive evaluation and provision of chronic care management services.   SDOH (Social Determinants of Health) assessments and interventions performed:    CCM Care Plan  Allergies  Allergen Reactions  . Aspirin Other (See Comments)    Nosebleeds     Outpatient Encounter Medications as of 02/05/2021  Medication Sig  . amLODipine (NORVASC) 10 MG tablet TAKE ONE TABLET BY MOUTH DAILY (Patient taking differently: Take 10 mg by mouth daily.)  . atorvastatin (LIPITOR) 80 MG tablet TAKE ONE TABLET BY MOUTH DAILY (Patient taking differently: Take 80 mg by mouth daily.)  . BISACODYL 5 MG EC tablet TAKE 1 TABLET (5 MG TOTAL) BY MOUTH DAILY AS NEEDED FOR MODERATE CONSTIPATION. (Patient taking differently: Take 5 mg by mouth daily as needed for mild constipation.)  . Blood Pressure Monitoring (BLOOD PRESSURE KIT) DEVI Please check blood pressure twice a day. In the morning and evening. Please record numbers on a sheet of paper. Blood pressure parameters  are 120-150/80-90  . calcitRIOL (ROCALTROL) 0.25 MCG capsule Take 0.25 mcg by mouth daily.  . clobetasol cream (TEMOVATE) 0.05 % APPLY TOPICALLY TWO (TWO) TIMES DAILY. (Patient taking differently: Apply 1 application topically 2 (two) times daily.)  . docusate sodium (COLACE) 100 MG capsule Take 1 capsule (100 mg total) by mouth daily.  [name] ELIQUIS 2.5 MG TABS tablet TAKE ONE TABLET BY MOUTH TWICE A DAY  . finasteride (PROSCAR) 5 MG tablet TAKE ONE TABLET BY MOUTH DAILY  . furosemide (LASIX) 40 MG tablet TAKE ONE TABLET BY MOUTH TWICE A DAY  . hydrALAZINE (APRESOLINE) 10 MG tablet Take 1.5 tablets (15 mg total) by mouth 3 (three) times daily.  . nitroGLYCERIN (NITROSTAT) 0.4 MG SL tablet Place 1 tablet (0.4 mg total) under the tongue every 5 (five) minutes x 3 doses as needed for chest pain.  [name] omeprazole (PRILOSEC) 40 MG capsule Take one capsule shortly before breakfast each morning  . predniSONE (DELTASONE) 10 MG tablet Take 10 mg by mouth daily.   . tamsulosin (FLOMAX) 0.4 MG CAPS capsule Take 2 capsules (0.8 mg total) by mouth daily.  [name] triamcinolone ointment (KENALOG) 0.1 % Apply 1 application topically 2 (two) times daily.  . VELTASSA 8.4 g packet Take 1 packet by mouth daily.   No facility-administered encounter medications on file as of 02/05/2021.    Patient Active Problem List   Diagnosis Date Noted  . Goals of care, counseling/discussion 11/30/2020  . Rhinorrhea 11/29/2020  . Dysphagia 08/23/2020  . Psoriasis 04/23/2020  . Acute on chronic renal failure ([place]) 03/03/2020  . Acute kidney injury superimposed on  chronic kidney disease ([place])   . Normal anion gap metabolic acidosis   . Epistaxis 02/20/2020  . Atrial fibrillation ([place]) 10/14/2018  . Type 2 diabetes mellitus ([place]) 09/04/2017  . Hemiballism 08/31/2017  . Ischemic pain of foot, right   . Severe peripheral arterial disease ([place]) 02/27/2017  . Cerebellar infarct ([place]) 05/06/2016  . Benign prostatic hyperplasia with  urinary obstruction   . Orthostatic hypotension 04/04/2016  . Urinary retention 03/20/2016  . Cerebrovascular accident (CVA) due to occlusion of left posterior cerebral artery ([place])   . Dizziness 03/07/2016  . Essential hypertension   . Normocytic anemia 10/20/2014  . Hyporeninemic hypoaldosteronism ([place]) 08/24/2014  . Hyperkalemia 08/01/2014  . Healthcare maintenance 07/13/2014  . Atherosclerosis of leg with intermittent claudication, R 06/29/2014  . Current smoker 06/27/2014  . Alcohol use ([place]) 06/27/2014  . CKD (chronic kidney disease), stage IV ([place]) 06/26/2014    Conditions to be addressed/monitored:NIDDM, HTN, HLD, ESRD, A fib, s/p CVA with left sided spasticity   Care Plan : Level of care concerns  Updates made by Barrington Ellison, RN since 02/05/2021 12:00 AM    Problem: Patient needs assistance with ADLS and IADLS.   Priority: High  Onset Date: 11/19/2020    Goal: CCM RN- Self-Management Plan Developed   Start Date: 11/19/2020  Expected End Date: 09/13/2021  Recent Progress: On track  Priority: High  Note:   Current Barriers:  . Care Coordination needs related to alternate housing and/or community assistance in a patient with NIDDM, HTN, HLD, CKD, A fib, s/p CVA with left sided spasticity - spoke with patient via hone, he states he feels well, is taking short walks and carrying wood to his home that is heated with a wood stove, he says he had an hour long discussion with the palliative care NP on 01/22/21 and despite the options that were offered to provide ADL and IADL assistance he is still resistant to help in his home, he says his niece told him his K+ is high again and his nephrologist is questioning his adherence to [facility]  and wants to see him Thursday ,patient states he does not take [place] on the days he has appointments because he is afraid he will need to go the bathroom while he is on the [employer] bus, he says he wants to stay in his home as long as  possible and agrees to notify his niece and or this CCM RN when he feels he can no longer live alone . Dr. Nathanial Rancher discussed Palliative Care with patient and niece on 11/30/20.  Patient consented to referral for services.  . Unable to perform ADLs independently . Unable to perform IADLs independently  Nurse Case Manager Clinical Goal(s):  [name] Over the next 30-60 days, patient will work with CCM clinical social worker to investigate PACE and assisted living facilities  Interventions:  . Inter-disciplinary care team collaboration (see longitudinal plan of care) . Reviewed patient's clinical status related to ESRD and not a candidate for dialysis and services provided by palliative care referral  . Voiced appreciation to patient for keeping virtual Palliative Care Consult on 01/22/21 . Reinforced with patient that his health care team and niece want to honor his end of life wishes and maximize his quality of life via shared decision making . Reinforced the importance of patient notifying his niece or health care team at the time he wishes to stop aggressive care and focus on comfort care . Updated patient's niece [name] via  phone on the information shared with patient today during CCM RN call   Patient Goals/Self-Care Activities Over the next 30-60 days, patient will:  - work with CCM team to direct shared decision making  Follow Up Plan: The care management team will reach out to the patient again over the next 30-60 days.         Plan:The care management team will reach out to the patient again over the next 30-60 days.   Kelli Churn RN, CCM, [facility] RN Care Manager [phone]

## 2021-02-05 NOTE — Patient Instructions (Signed)
Visit Information It was nice speaking with you today. PATIENT GOALS: Patient Care Plan: Level of care concerns    Problem Identified: Patient needs assistance with ADLS and IADLS.   Priority: High  Onset Date: 11/19/2020    Goal: CCM RN- Self-Management Plan Developed   Start Date: 11/19/2020  Expected End Date: 09/13/2021  Recent Progress: On track  Priority: High  Note:   Current Barriers:  . Care Coordination needs related to alternate housing and/or community assistance in a patient with NIDDM, HTN, HLD, CKD, A fib, s/p CVA with left sided spasticity - spoke with patient via hone, he states he feels well, is taking short walks and carrying wood to his home that is heated with a wood stove, he says he had an hour long discussion with the palliative care NP on 01/22/21 and despite the options that were offered to provide ADL and IADL assistance he is still resistant to help in his home, he says his niece told him his K+ is high again and his nephrologist is questioning his adherence to [facility]  and wants to see him Thursday ,patient states he does not take Velt[place] on the days he has appointments because he is afraid he will need to go the bathroom while he is on the [employer] bus, he says he wants to stay in his home as long as possible and agrees to notify his niece and or this CCM RN when he feels he can no longer live alone . Dr. Nathanial Rancher discussed Palliative Care with patient and niece on 11/30/20.  Patient consented to referral for services.  . Unable to perform ADLs independently . Unable to perform IADLs independently  Nurse Case Manager Clinical Goal(s):  [name] Over the next 30-60 days, patient will work with CCM clinical social worker to investigate PACE and assisted living facilities  Interventions:  . Inter-disciplinary care team collaboration (see longitudinal plan of care) . Reviewed patient's clinical status related to ESRD and not a candidate for dialysis and services  provided by palliative care referral  . Voiced appreciation to patient for keeping virtual Palliative Care Consult on 01/22/21 . Reinforced with patient that his health care team and niece want to honor his end of life wishes and maximize his quality of life via shared decision making . Reinforced the importance of patient notifying his niece or health care team at the time he wishes to stop aggressive care and focus on comfort care . Updated patient's niece [name] via phone on the information shared with patient today during CCM RN call   Patient Goals/Self-Care Activities Over the next 30-60 days, patient will:  - work with CCM team to direct shared decision making  Follow Up Plan: The care management team will reach out to the patient again over the next 30-60 days.       Patient Care Plan: Patient needs assistance with establishing new  podiatrist as he is unable to perform nail care  Completed 01/10/2021  Problem Identified: Disease Progression (Diabetes, Type 2) Resolved 12/18/2020  Priority: Medium  Onset Date: 11/19/2020  Note:   Current Barriers:  . Care Coordination needs related to foot care in a patient with NIDDM, HTN, HLD, CKD, A fib, s/p CVA with left sided spasticity  . Unable to independently secure new podiatrist- patient states he cannot cut his toenails but had a bad experience with a previous podiatrist so is seeking to establish with a new one  Nurse Case Manager Clinical Goal(s):  .  Over the next 30 days, patient will meet with RN Care Manager to address establishing a new podiatrist  Interventions:  . Inter-disciplinary care team collaboration (see longitudinal plan of care) . Asked clinic provider for referral to podiatrist . At patient's request provided names of local podiatrist avoiding prior podiatrist to assist patient in securing appointment with new podiatrist  Ensure completion of annual comprehensive foot exam and dilated eye exam.    Implement  additional individualized goals and interventions based on identified risk factors.   Prepare patient for referral for specialist care; podiatry  Provided update to patient's niece [name] via her voice mail and advised her this CCM RN will mail her the names of local podiatrist to assist patient with establishing care  Patient has podiatry appointment on 01/21/21  Patient Goals/Self-Care Activities Over the next 30 days, patient will:  -  make and keep appointment with new podiatrist to complete nail and foot care  Follow Up Plan: The care management team will reach out to the patient again over the next 30-60 days.         The patient verbalized understanding of instructions, educational materials, and care plan provided today and declined offer to receive copy of patient instructions, educational materials, and care plan.   The care management team will reach out to the patient again over the next 30-60 days.   Kelli Churn RN, CCM, [facility] RN Care Manager [phone]

## 2021-02-05 NOTE — Progress Notes (Signed)
Internal Medicine Clinic Resident  I have personally reviewed this encounter including the documentation in this note and/or discussed this patient with the care management provider. I will address any urgent items identified by the care management provider and will communicate my actions to the patient's PCP. I have reviewed the patient's CCM visit with my supervising attending, Dr Vincent.  Yamile Roedl, MD 02/05/2021    

## 2021-02-06 NOTE — Progress Notes (Signed)
Internal Medicine Clinic Attending  CCM services provided by the care management provider and their documentation were discussed with Dr. Steen. We reviewed the pertinent findings, urgent action items addressed by the resident and non-urgent items to be addressed by the PCP.  I agree with the assessment, diagnosis, and plan of care documented in the CCM and resident's note.  Axel Meas, MD 02/06/2021  

## 2021-02-06 NOTE — Progress Notes (Signed)
Internal Medicine Clinic Resident  I have personally reviewed this encounter including the documentation in this note and/or discussed this patient with the care management provider. I will address any urgent items identified by the care management provider and will communicate my actions to the patient's PCP. I have reviewed the patient's CCM visit with my supervising attending, Dr Mullen.  Jeffrey Fumie Fiallo, MD 02/06/2021    

## 2021-02-07 DIAGNOSIS — E875 Hyperkalemia: Secondary | ICD-10-CM | POA: Diagnosis not present

## 2021-02-07 DIAGNOSIS — I129 Hypertensive chronic kidney disease with stage 1 through stage 4 chronic kidney disease, or unspecified chronic kidney disease: Secondary | ICD-10-CM | POA: Diagnosis not present

## 2021-02-07 DIAGNOSIS — E1122 Type 2 diabetes mellitus with diabetic chronic kidney disease: Secondary | ICD-10-CM | POA: Diagnosis not present

## 2021-02-07 DIAGNOSIS — N184 Chronic kidney disease, stage 4 (severe): Secondary | ICD-10-CM | POA: Diagnosis not present

## 2021-02-07 DIAGNOSIS — N189 Chronic kidney disease, unspecified: Secondary | ICD-10-CM | POA: Diagnosis not present

## 2021-02-07 DIAGNOSIS — D631 Anemia in chronic kidney disease: Secondary | ICD-10-CM | POA: Diagnosis not present

## 2021-02-07 DIAGNOSIS — N2581 Secondary hyperparathyroidism of renal origin: Secondary | ICD-10-CM | POA: Diagnosis not present

## 2021-02-08 ENCOUNTER — Encounter (HOSPITAL_COMMUNITY): Payer: Medicare HMO

## 2021-02-11 ENCOUNTER — Other Ambulatory Visit: Payer: Self-pay | Admitting: Internal Medicine

## 2021-02-11 ENCOUNTER — Telehealth: Payer: Medicare HMO

## 2021-02-15 ENCOUNTER — Encounter (HOSPITAL_COMMUNITY): Payer: Medicare HMO

## 2021-02-25 ENCOUNTER — Other Ambulatory Visit: Payer: Self-pay

## 2021-02-25 ENCOUNTER — Ambulatory Visit (HOSPITAL_COMMUNITY)
Admission: RE | Admit: 2021-02-25 | Discharge: 2021-02-25 | Disposition: A | Discharge status: Home / Self Care | Payer: Medicare HMO | Source: Ambulatory Visit | Attending: Nephrology | Admitting: Nephrology

## 2021-02-25 VITALS — BP 162/103 | HR 86 | Temp 97.6°F | Resp 20

## 2021-02-25 DIAGNOSIS — N184 Chronic kidney disease, stage 4 (severe): Secondary | ICD-10-CM | POA: Diagnosis not present

## 2021-02-25 LAB — POCT HEMOGLOBIN-HEMACUE: Hemoglobin: 13.1 g/dL (ref 13.0–17.0)

## 2021-02-25 MED ORDER — EPOETIN ALFA 10000 UNIT/ML IJ SOLN
10000.0000 [IU] | INTRAMUSCULAR | Status: DC
Start: 2021-02-25 — End: 2021-02-26

## 2021-03-04 DIAGNOSIS — E1122 Type 2 diabetes mellitus with diabetic chronic kidney disease: Secondary | ICD-10-CM | POA: Diagnosis not present

## 2021-03-04 DIAGNOSIS — E875 Hyperkalemia: Secondary | ICD-10-CM | POA: Diagnosis not present

## 2021-03-04 DIAGNOSIS — N184 Chronic kidney disease, stage 4 (severe): Secondary | ICD-10-CM | POA: Diagnosis not present

## 2021-03-04 DIAGNOSIS — N2581 Secondary hyperparathyroidism of renal origin: Secondary | ICD-10-CM | POA: Diagnosis not present

## 2021-03-04 DIAGNOSIS — I129 Hypertensive chronic kidney disease with stage 1 through stage 4 chronic kidney disease, or unspecified chronic kidney disease: Secondary | ICD-10-CM | POA: Diagnosis not present

## 2021-03-04 DIAGNOSIS — D631 Anemia in chronic kidney disease: Secondary | ICD-10-CM | POA: Diagnosis not present

## 2021-03-05 ENCOUNTER — Telehealth: Payer: Self-pay | Admitting: *Deleted

## 2021-03-05 ENCOUNTER — Telehealth: Payer: Medicare HMO

## 2021-03-05 NOTE — Telephone Encounter (Signed)
  Chronic Care Management   Outreach Note  03/05/2021 Name: Derrick Hendrix. MRN: [id number] DOB: 05-08-1941  Referred by: [name], MD Reason for referral : Chronic Care Management (NIDDM, HTN, HLD, ESRD, A fib, s/p CVA with left sided spasticity )   An unsuccessful telephone outreach was attempted today. The patient was referred to the case management team for assistance with care management and care coordination.   Follow Up Plan: A HIPAA compliant phone message was left for the patient providing contact information and requesting a return call.  The care management team will reach out to the patient again over the next 7-14 days.   Kelli Churn RN, CCM, [facility] RN Care Manager [phone]

## 2021-03-11 ENCOUNTER — Other Ambulatory Visit: Payer: Self-pay

## 2021-03-11 ENCOUNTER — Encounter (HOSPITAL_COMMUNITY)
Admission: RE | Admit: 2021-03-11 | Discharge: 2021-03-11 | Disposition: A | Discharge status: Home / Self Care | Payer: Medicare HMO | Source: Ambulatory Visit | Attending: Nephrology | Admitting: Nephrology

## 2021-03-11 VITALS — BP 147/90 | HR 89 | Temp 97.4°F | Resp 20

## 2021-03-11 DIAGNOSIS — D631 Anemia in chronic kidney disease: Secondary | ICD-10-CM | POA: Diagnosis not present

## 2021-03-11 DIAGNOSIS — N184 Chronic kidney disease, stage 4 (severe): Secondary | ICD-10-CM | POA: Insufficient documentation

## 2021-03-11 LAB — FERRITIN: Ferritin: 312 ng/mL (ref 24–336)

## 2021-03-11 LAB — POCT HEMOGLOBIN-HEMACUE: Hemoglobin: 11.1 g/dL — ABNORMAL LOW (ref 13.0–17.0)

## 2021-03-11 LAB — IRON AND TIBC
Iron: 70 ug/dL (ref 45–182)
Saturation Ratios: 23 % (ref 17.9–39.5)
TIBC: 308 ug/dL (ref 250–450)
UIBC: 238 ug/dL

## 2021-03-11 MED ORDER — EPOETIN ALFA 10000 UNIT/ML IJ SOLN
INTRAMUSCULAR | Status: AC
  Filled 2021-03-11: qty 1

## 2021-03-11 MED ORDER — EPOETIN ALFA 10000 UNIT/ML IJ SOLN
10000.0000 [IU] | INTRAMUSCULAR | Status: DC
  Administered 2021-03-11: 10000 [IU] via SUBCUTANEOUS

## 2021-03-12 ENCOUNTER — Ambulatory Visit: Payer: Medicare HMO | Admitting: *Deleted

## 2021-03-12 DIAGNOSIS — I10 Essential (primary) hypertension: Secondary | ICD-10-CM

## 2021-03-12 DIAGNOSIS — N184 Chronic kidney disease, stage 4 (severe): Secondary | ICD-10-CM

## 2021-03-12 DIAGNOSIS — E1122 Type 2 diabetes mellitus with diabetic chronic kidney disease: Secondary | ICD-10-CM

## 2021-03-12 DIAGNOSIS — I480 Paroxysmal atrial fibrillation: Secondary | ICD-10-CM

## 2021-03-12 DIAGNOSIS — N183 Chronic kidney disease, stage 3 unspecified: Secondary | ICD-10-CM

## 2021-03-12 DIAGNOSIS — I63532 Cerebral infarction due to unspecified occlusion or stenosis of left posterior cerebral artery: Secondary | ICD-10-CM

## 2021-03-12 NOTE — Chronic Care Management (AMB) (Signed)
Care Management    RN Visit Note  03/12/2021 Name: Derrick Hendrix. MRN: [id number] DOB: 1941-03-11  Subjective: Derrick Hendrix. is a 80 y.o. year old male who is a primary care patient of [name], [name], MD. The care management team was consulted for assistance with disease management and care coordination needs.    Engaged with patient by telephone for follow up visit in response to provider referral for case management and/or care coordination services.   Consent to Services:   Derrick Hendrix was given information about Care Management services today including:  1. Care Management services includes personalized support from designated clinical staff supervised by his physician, including individualized plan of care and coordination with other care providers 2. 24/7 contact phone numbers for assistance for urgent and routine care needs. 3. The patient may stop case management services at any time by phone call to the office staff.  Patient agreed to services and consent obtained.   Assessment: Review of patient past medical history, allergies, medications, health status, including review of consultants reports, laboratory and other test data, was performed as part of comprehensive evaluation and provision of chronic care management services.   SDOH (Social Determinants of Health) assessments and interventions performed:    Care Plan  Allergies  Allergen Reactions  . Aspirin Other (See Comments)    Nosebleeds     Outpatient Encounter Medications as of 03/12/2021  Medication Sig  . amLODipine (NORVASC) 10 MG tablet TAKE ONE TABLET BY MOUTH DAILY (Patient taking differently: Take 10 mg by mouth daily.)  . atorvastatin (LIPITOR) 80 MG tablet TAKE ONE TABLET BY MOUTH DAILY (Patient taking differently: Take 80 mg by mouth daily.)  . BISACODYL 5 MG EC tablet TAKE 1 TABLET (5 MG TOTAL) BY MOUTH DAILY AS NEEDED FOR MODERATE CONSTIPATION. (Patient taking differently: Take 5 mg by mouth  daily as needed for mild constipation.)  . Blood Pressure Monitoring (BLOOD PRESSURE KIT) DEVI Please check blood pressure twice a day. In the morning and evening. Please record numbers on a sheet of paper. Blood pressure parameters are 120-150/80-90  . calcitRIOL (ROCALTROL) 0.25 MCG capsule Take 0.25 mcg by mouth daily.  . clobetasol cream (TEMOVATE) 0.05 % APPLY TOPICALLY TWO (TWO) TIMES DAILY. (Patient taking differently: Apply 1 application topically 2 (two) times daily.)  . docusate sodium (COLACE) 100 MG capsule Take 1 capsule (100 mg total) by mouth daily.  [name] ELIQUIS 2.5 MG TABS tablet TAKE ONE TABLET BY MOUTH TWICE A DAY  . finasteride (PROSCAR) 5 MG tablet TAKE ONE TABLET BY MOUTH DAILY  . furosemide (LASIX) 40 MG tablet TAKE ONE TABLET BY MOUTH TWICE A DAY  . hydrALAZINE (APRESOLINE) 10 MG tablet Take 1.5 tablets (15 mg total) by mouth 3 (three) times daily.  . nitroGLYCERIN (NITROSTAT) 0.4 MG SL tablet Place 1 tablet (0.4 mg total) under the tongue every 5 (five) minutes x 3 doses as needed for chest pain.  [name] omeprazole (PRILOSEC) 40 MG capsule Take one capsule shortly before breakfast each morning  . predniSONE (DELTASONE) 10 MG tablet Take 10 mg by mouth daily.   . tamsulosin (FLOMAX) 0.4 MG CAPS capsule Take 2 capsules (0.8 mg total) by mouth daily.  [name] triamcinolone ointment (KENALOG) 0.1 % Apply 1 application topically 2 (two) times daily.  . VELTASSA 8.4 g packet Take 1 packet by mouth daily.   No facility-administered encounter medications on file as of 03/12/2021.    Patient Active Problem List   Diagnosis Date  Noted  . Goals of care, counseling/discussion 11/30/2020  . Rhinorrhea 11/29/2020  . Dysphagia 08/23/2020  . Psoriasis 04/23/2020  . Acute on chronic renal failure ([place]) 03/03/2020  . Acute kidney injury superimposed on chronic kidney disease ([place])   . Normal anion gap metabolic acidosis   . Epistaxis 02/20/2020  . Atrial fibrillation ([place]) 10/14/2018  . Type 2  diabetes mellitus ([place]) 09/04/2017  . Hemiballism 08/31/2017  . Ischemic pain of foot, right   . Severe peripheral arterial disease ([place]) 02/27/2017  . Cerebellar infarct ([place]) 05/06/2016  . Benign prostatic hyperplasia with urinary obstruction   . Orthostatic hypotension 04/04/2016  . Urinary retention 03/20/2016  . Cerebrovascular accident (CVA) due to occlusion of left posterior cerebral artery ([place])   . Dizziness 03/07/2016  . Essential hypertension   . Normocytic anemia 10/20/2014  . Hyporeninemic hypoaldosteronism ([place]) 08/24/2014  . Hyperkalemia 08/01/2014  . Healthcare maintenance 07/13/2014  . Atherosclerosis of leg with intermittent claudication, R 06/29/2014  . Current smoker 06/27/2014  . Alcohol use ([place]) 06/27/2014  . CKD (chronic kidney disease), stage IV ([place]) 06/26/2014    Conditions to be addressed/monitored: NIDDM, HTN, HLD, ESRD, A fib, s/p CVA with left sided spasticity   Care Plan : Level of care concerns  Updates made by Derrick Ellison, RN since 03/12/2021 12:00 AM    Problem: Patient needs assistance with ADLS and IADLS.   Priority: High  Onset Date: 11/19/2020    Goal: CCM RN- Self-Management Plan Developed   Start Date: 11/19/2020  Expected End Date: 09/13/2021  Recent Progress: On track  Priority: High  Note:   Current Barriers:  . Care Coordination needs related to alternate housing and/or community assistance in a patient with NIDDM, HTN, HLD, CKD, A fib, s/p CVA with left sided spasticity - spoke with patient via phone to complete follow up assessment, , he states he feels well, just finished a short walk outside, and carrying wood to his home that is heated with a wood stove, he says he is keeping his appointments to get his Epojen injections, he is asking about shoe wear, continues to say he wants to stay in his home as long as possible and agrees to notify his niece and or this CCM RN when he feels he can no longer live alone . Derrick Hendrix  discussed Palliative Care with patient and niece on 11/30/20.  Patient consented to referral for services.  . Unable to perform ADLs independently . Unable to perform IADLs independently  Nurse Case Manager Clinical Goal(s):  [name] Over the next 30-60 days, patient will work with CCM clinical social worker to investigate PACE and assisted living facilities  Interventions:  . Inter-disciplinary care team collaboration (see longitudinal plan of care) . Reviewed patient's clinical status related to ESRD and not a candidate for dialysis and services provided by palliative care referral  . Discussed orthotics health plan benefits and advised patient to ask podiatrist about foot wear during his April appointment . Reinforced with patient that his health care team and niece want to honor his end of life wishes and maximize his quality of life via shared decision making . Reinforced the importance of patient notifying his niece or health care team at the time he wishes to stop aggressive care and focus on comfort care   Patient Goals/Self-Care Activities Over the next 30-60 days, patient will:  - work with CCM team to direct shared decision making  Follow Up Plan: The care management team will  reach out to the patient again over the next 30-60 days.         Kelli Churn RN, CCM, [facility] RN Care Manager [phone]

## 2021-03-12 NOTE — Patient Instructions (Signed)
Visit Information It was nice speaking with you today. Patient Care Plan: Level of care concerns    Problem Identified: Patient needs assistance with ADLS and IADLS.   Priority: High  Onset Date: 11/19/2020    Goal: CCM RN- Self-Management Plan Developed   Start Date: 11/19/2020  Expected End Date: 09/13/2021  Recent Progress: On track  Priority: High  Note:   Current Barriers:  . Care Coordination needs related to alternate housing and/or community assistance in a patient with NIDDM, HTN, HLD, CKD, A fib, s/p CVA with left sided spasticity - spoke with patient via phone to complete follow up assessment, , he states he feels well, just finished a short walk outside, and carrying wood to his home that is heated with a wood stove, he says he is keeping his appointments to get his Epojen injections, he is asking about shoe wear, continues to say he wants to stay in his home as long as possible and agrees to notify his niece and or this CCM RN when he feels he can no longer live alone . Dr. Nathanial Rancher discussed Palliative Care with patient and niece on 11/30/20.  Patient consented to referral for services.  . Unable to perform ADLs independently . Unable to perform IADLs independently  Nurse Case Manager Clinical Goal(s):  [name] Over the next 30-60 days, patient will work with CCM clinical social worker to investigate PACE and assisted living facilities  Interventions:  . Inter-disciplinary care team collaboration (see longitudinal plan of care) . Reviewed patient's clinical status related to ESRD and not a candidate for dialysis and services provided by palliative care referral  . Discussed orthotics health plan benefits and advised patient to ask podiatrist about foot wear during his April appointment . Reinforced with patient that his health care team and niece want to honor his end of life wishes and maximize his quality of life via shared decision making . Reinforced the importance of patient  notifying his niece or health care team at the time he wishes to stop aggressive care and focus on comfort care   Patient Goals/Self-Care Activities Over the next 30-60 days, patient will:  - work with CCM team to direct shared decision making  Follow Up Plan: The care management team will reach out to the patient again over the next 30-60 days.       Patient Care Plan: Patient needs assistance with establishing new  podiatrist as he is unable to perform nail care  Completed 01/14/2021  Problem Identified: Disease Progression (Diabetes, Type 2) Resolved 01/14/2021  Priority: Medium  Onset Date: 11/19/2020  Note:   Current Barriers:  . Care Coordination needs related to foot care in a patient with NIDDM, HTN, HLD, CKD, A fib, s/p CVA with left sided spasticity  . Unable to independently secure new podiatrist- patient states he cannot cut his toenails but had a bad experience with a previous podiatrist so is seeking to establish with a new one  Nurse Case Manager Clinical Goal(s):  [name] Over the next 30 days, patient will meet with RN Care Manager to address establishing a new podiatrist  Interventions:  . Inter-disciplinary care team collaboration (see longitudinal plan of care) . Asked clinic provider for referral to podiatrist . At patient's request provided names of local podiatrist avoiding prior podiatrist to assist patient in securing appointment with new podiatrist  Ensure completion of annual comprehensive foot exam and dilated eye exam.    Implement additional individualized goals and interventions based on  identified risk factors.   Prepare patient for referral for specialist care; podiatry  Provided update to patient's niece [name] via her voice mail and advised her this CCM RN will mail her the names of local podiatrist to assist patient with establishing care  Patient has podiatry appointment on 01/21/21  Patient Goals/Self-Care Activities Over the next 30 days, patient  will:  -  make and keep appointment with new podiatrist to complete nail and foot care  Follow Up Plan: The care management team will reach out to the patient again over the next 30-60 days.         The patient verbalized understanding of instructions, educational materials, and care plan provided today and declined offer to receive copy of patient instructions, educational materials, and care plan.     Kelli Churn RN, CCM, [facility] RN Care Manager [phone]

## 2021-03-18 ENCOUNTER — Encounter (HOSPITAL_COMMUNITY)
Admission: RE | Admit: 2021-03-18 | Discharge: 2021-03-18 | Disposition: A | Discharge status: Home / Self Care | Payer: Medicare HMO | Source: Ambulatory Visit | Attending: Nephrology | Admitting: Nephrology

## 2021-03-18 ENCOUNTER — Other Ambulatory Visit: Payer: Self-pay

## 2021-03-18 VITALS — BP 146/80 | HR 80 | Resp 16

## 2021-03-18 DIAGNOSIS — N184 Chronic kidney disease, stage 4 (severe): Secondary | ICD-10-CM | POA: Diagnosis present

## 2021-03-18 LAB — POCT HEMOGLOBIN-HEMACUE: Hemoglobin: 11.7 g/dL — ABNORMAL LOW (ref 13.0–17.0)

## 2021-03-18 MED ORDER — EPOETIN ALFA 10000 UNIT/ML IJ SOLN: 10000.0000 [IU] | INTRAMUSCULAR | Status: DC

## 2021-03-18 MED ORDER — EPOETIN ALFA 10000 UNIT/ML IJ SOLN
INTRAMUSCULAR | Status: AC
  Administered 2021-03-18: 10000 [IU] via SUBCUTANEOUS
  Filled 2021-03-18: qty 1

## 2021-03-25 ENCOUNTER — Other Ambulatory Visit: Payer: Self-pay

## 2021-03-25 ENCOUNTER — Encounter (HOSPITAL_COMMUNITY)
Admission: RE | Admit: 2021-03-25 | Discharge: 2021-03-25 | Disposition: A | Discharge status: Home / Self Care | Payer: Medicare HMO | Source: Ambulatory Visit | Attending: Nephrology | Admitting: Nephrology

## 2021-03-25 VITALS — BP 153/103 | HR 80 | Temp 96.6°F | Resp 20

## 2021-03-25 DIAGNOSIS — N184 Chronic kidney disease, stage 4 (severe): Secondary | ICD-10-CM

## 2021-03-25 LAB — POCT HEMOGLOBIN-HEMACUE: Hemoglobin: 11.6 g/dL — ABNORMAL LOW (ref 13.0–17.0)

## 2021-03-25 MED ORDER — EPOETIN ALFA 10000 UNIT/ML IJ SOLN
INTRAMUSCULAR | Status: AC
  Filled 2021-03-25: qty 1

## 2021-03-25 MED ORDER — EPOETIN ALFA 10000 UNIT/ML IJ SOLN
10000.0000 [IU] | INTRAMUSCULAR | Status: DC
  Administered 2021-03-25: 10000 [IU] via SUBCUTANEOUS

## 2021-04-01 ENCOUNTER — Other Ambulatory Visit: Payer: Self-pay

## 2021-04-01 ENCOUNTER — Encounter (HOSPITAL_COMMUNITY)
Admission: RE | Admit: 2021-04-01 | Discharge: 2021-04-01 | Disposition: A | Discharge status: Home / Self Care | Payer: Medicare HMO | Source: Ambulatory Visit | Attending: Nephrology | Admitting: Nephrology

## 2021-04-01 VITALS — BP 174/110 | HR 81 | Temp 97.5°F | Resp 20

## 2021-04-01 DIAGNOSIS — N184 Chronic kidney disease, stage 4 (severe): Secondary | ICD-10-CM | POA: Diagnosis not present

## 2021-04-01 LAB — POCT HEMOGLOBIN-HEMACUE: Hemoglobin: 12.6 g/dL — ABNORMAL LOW (ref 13.0–17.0)

## 2021-04-01 MED ORDER — EPOETIN ALFA 10000 UNIT/ML IJ SOLN
INTRAMUSCULAR | Status: AC
  Filled 2021-04-01: qty 1

## 2021-04-01 MED ORDER — EPOETIN ALFA 10000 UNIT/ML IJ SOLN: 10000.0000 [IU] | INTRAMUSCULAR | Status: DC

## 2021-04-08 ENCOUNTER — Encounter (HOSPITAL_COMMUNITY): Payer: Medicare HMO

## 2021-04-08 ENCOUNTER — Other Ambulatory Visit: Payer: Self-pay | Admitting: Student

## 2021-04-08 ENCOUNTER — Other Ambulatory Visit: Payer: Self-pay | Admitting: Internal Medicine

## 2021-04-08 DIAGNOSIS — R339 Retention of urine, unspecified: Secondary | ICD-10-CM

## 2021-04-08 DIAGNOSIS — I10 Essential (primary) hypertension: Secondary | ICD-10-CM

## 2021-04-11 DIAGNOSIS — E2740 Unspecified adrenocortical insufficiency: Secondary | ICD-10-CM | POA: Diagnosis not present

## 2021-04-11 DIAGNOSIS — E875 Hyperkalemia: Secondary | ICD-10-CM | POA: Diagnosis not present

## 2021-04-11 DIAGNOSIS — I739 Peripheral vascular disease, unspecified: Secondary | ICD-10-CM | POA: Diagnosis not present

## 2021-04-11 DIAGNOSIS — D6869 Other thrombophilia: Secondary | ICD-10-CM | POA: Diagnosis not present

## 2021-04-11 DIAGNOSIS — I4891 Unspecified atrial fibrillation: Secondary | ICD-10-CM | POA: Diagnosis not present

## 2021-04-11 DIAGNOSIS — G3184 Mild cognitive impairment, so stated: Secondary | ICD-10-CM | POA: Diagnosis not present

## 2021-04-11 DIAGNOSIS — G8929 Other chronic pain: Secondary | ICD-10-CM | POA: Diagnosis not present

## 2021-04-11 DIAGNOSIS — E785 Hyperlipidemia, unspecified: Secondary | ICD-10-CM | POA: Diagnosis not present

## 2021-04-11 DIAGNOSIS — R69 Illness, unspecified: Secondary | ICD-10-CM | POA: Diagnosis not present

## 2021-04-11 DIAGNOSIS — I25119 Atherosclerotic heart disease of native coronary artery with unspecified angina pectoris: Secondary | ICD-10-CM | POA: Diagnosis not present

## 2021-04-15 ENCOUNTER — Encounter (HOSPITAL_COMMUNITY)
Admission: RE | Admit: 2021-04-15 | Discharge: 2021-04-15 | Disposition: A | Discharge status: Home / Self Care | Payer: Medicare HMO | Source: Ambulatory Visit | Attending: Nephrology | Admitting: Nephrology

## 2021-04-15 ENCOUNTER — Other Ambulatory Visit: Payer: Self-pay

## 2021-04-15 VITALS — BP 150/88 | HR 85 | Temp 97.1°F | Resp 20

## 2021-04-15 DIAGNOSIS — N184 Chronic kidney disease, stage 4 (severe): Secondary | ICD-10-CM

## 2021-04-15 LAB — IRON AND TIBC
Iron: 100 ug/dL (ref 45–182)
Saturation Ratios: 33 % (ref 17.9–39.5)
TIBC: 307 ug/dL (ref 250–450)
UIBC: 207 ug/dL

## 2021-04-15 LAB — FERRITIN: Ferritin: 217 ng/mL (ref 24–336)

## 2021-04-15 LAB — POCT HEMOGLOBIN-HEMACUE: Hemoglobin: 12.6 g/dL — ABNORMAL LOW (ref 13.0–17.0)

## 2021-04-15 MED ORDER — EPOETIN ALFA 10000 UNIT/ML IJ SOLN: 10000.0000 [IU] | INTRAMUSCULAR | Status: DC

## 2021-04-22 ENCOUNTER — Encounter (HOSPITAL_COMMUNITY): Payer: Medicare HMO

## 2021-04-23 DIAGNOSIS — H259 Unspecified age-related cataract: Secondary | ICD-10-CM | POA: Diagnosis not present

## 2021-04-25 ENCOUNTER — Other Ambulatory Visit: Payer: Self-pay | Admitting: Internal Medicine

## 2021-04-25 NOTE — Telephone Encounter (Signed)
    atorvastatin (LIPITOR) 80 MG tablet  finasteride (PROSCAR) 5 MG tablet    [place], [place] (Ph: [phone])

## 2021-04-26 MED ORDER — ATORVASTATIN CALCIUM 80 MG PO TABS: 80.0000 mg | ORAL_TABLET | Freq: Every day | ORAL | 1 refills | Status: DC

## 2021-04-26 MED ORDER — FINASTERIDE 5 MG PO TABS: 5.0000 mg | ORAL_TABLET | Freq: Every day | ORAL | 1 refills | Status: DC

## 2021-04-29 ENCOUNTER — Encounter: Payer: Self-pay | Admitting: Student

## 2021-04-29 ENCOUNTER — Ambulatory Visit (INDEPENDENT_AMBULATORY_CARE_PROVIDER_SITE_OTHER): Payer: Medicare HMO | Admitting: Student

## 2021-04-29 ENCOUNTER — Encounter (HOSPITAL_COMMUNITY): Payer: Medicare HMO

## 2021-04-29 ENCOUNTER — Telehealth: Payer: Self-pay | Admitting: *Deleted

## 2021-04-29 VITALS — BP 129/82 | HR 104 | Temp 98.1°F | Wt 193.2 lb

## 2021-04-29 DIAGNOSIS — R339 Retention of urine, unspecified: Secondary | ICD-10-CM

## 2021-04-29 DIAGNOSIS — R300 Dysuria: Secondary | ICD-10-CM | POA: Diagnosis not present

## 2021-04-29 DIAGNOSIS — N138 Other obstructive and reflux uropathy: Secondary | ICD-10-CM

## 2021-04-29 DIAGNOSIS — Z7189 Other specified counseling: Secondary | ICD-10-CM

## 2021-04-29 DIAGNOSIS — N401 Enlarged prostate with lower urinary tract symptoms: Secondary | ICD-10-CM | POA: Diagnosis not present

## 2021-04-29 DIAGNOSIS — N184 Chronic kidney disease, stage 4 (severe): Secondary | ICD-10-CM | POA: Diagnosis not present

## 2021-04-29 LAB — CBC WITH DIFFERENTIAL/PLATELET
Abs Immature Granulocytes: 0.17 10*3/uL — ABNORMAL HIGH (ref 0.00–0.07)
Basophils Absolute: 0 10*3/uL (ref 0.0–0.1)
Basophils Relative: 0 %
Eosinophils Absolute: 0 10*3/uL (ref 0.0–0.5)
Eosinophils Relative: 0 %
HCT: 37.7 % — ABNORMAL LOW (ref 39.0–52.0)
Hemoglobin: 11.5 g/dL — ABNORMAL LOW (ref 13.0–17.0)
Immature Granulocytes: 1 %
Lymphocytes Relative: 6 %
Lymphs Abs: 1.3 10*3/uL (ref 0.7–4.0)
MCH: 30.1 pg (ref 26.0–34.0)
MCHC: 30.5 g/dL (ref 30.0–36.0)
MCV: 98.7 fL (ref 80.0–100.0)
Monocytes Absolute: 1.9 10*3/uL — ABNORMAL HIGH (ref 0.1–1.0)
Monocytes Relative: 9 %
Neutro Abs: 18.3 10*3/uL — ABNORMAL HIGH (ref 1.7–7.7)
Neutrophils Relative %: 84 %
Platelets: 262 10*3/uL (ref 150–400)
RBC: 3.82 MIL/uL — ABNORMAL LOW (ref 4.22–5.81)
RDW: 15 % (ref 11.5–15.5)
WBC: 21.8 10*3/uL — ABNORMAL HIGH (ref 4.0–10.5)
nRBC: 0 % (ref 0.0–0.2)

## 2021-04-29 LAB — BASIC METABOLIC PANEL
Anion gap: 10 (ref 5–15)
BUN: 86 mg/dL — ABNORMAL HIGH (ref 8–23)
CO2: 20 mmol/L — ABNORMAL LOW (ref 22–32)
Calcium: 9.1 mg/dL (ref 8.9–10.3)
Chloride: 106 mmol/L (ref 98–111)
Creatinine, Ser: 5.24 mg/dL — ABNORMAL HIGH (ref 0.61–1.24)
GFR, Estimated: 10 mL/min — ABNORMAL LOW (ref >60)
Glucose, Bld: 114 mg/dL — ABNORMAL HIGH (ref 70–99)
Potassium: 5.4 mmol/L — ABNORMAL HIGH (ref 3.5–5.1)
Sodium: 136 mmol/L (ref 135–145)

## 2021-04-29 LAB — POCT URINALYSIS DIPSTICK
Bilirubin, UA: NEGATIVE
Glucose, UA: NEGATIVE
Ketones, UA: NEGATIVE
Nitrite, UA: NEGATIVE
Protein, UA: POSITIVE — AB
Spec Grav, UA: 1.025 (ref 1.010–1.025)
Urobilinogen, UA: 0.2 E.U./dL
pH, UA: 5 (ref 5.0–8.0)

## 2021-04-29 LAB — URINALYSIS, ROUTINE W REFLEX MICROSCOPIC
Bilirubin Urine: NEGATIVE
Glucose, UA: NEGATIVE mg/dL
Ketones, ur: NEGATIVE mg/dL
Nitrite: NEGATIVE
Protein, ur: >=300 mg/dL — AB
RBC / HPF: >50 RBC/hpf — ABNORMAL HIGH (ref 0–5)
Specific Gravity, Urine: 1.014 (ref 1.005–1.030)
WBC, UA: >50 WBC/hpf — ABNORMAL HIGH (ref 0–5)
pH: 5 (ref 5.0–8.0)

## 2021-04-29 MED ORDER — CEFDINIR 300 MG PO CAPS: 300.0000 mg | ORAL_CAPSULE | Freq: Every day | ORAL | 0 refills | Status: DC

## 2021-04-29 NOTE — Telephone Encounter (Signed)
Patient's niece, Dresean Beckel, called requesting appt. States patient called her c/o "trouble urinating, no flow." Also, burning with urination. Appt given this AM with Red Team as Yellow Team is full.

## 2021-04-29 NOTE — Assessment & Plan Note (Addendum)
Patient complaining of burning with urination and decreased flow. States that this began last night at 2AM. Prior to this, he reports being able to urinate about half a liter every day. Obtained a bladder scan to rule out urinary retention given history of BPH, which showed 40mL of urine. He reports taking his flomax twice daily as directed. Low suspicion for urinary retention as cause of patients symptoms.  Patient also has history of significantly worsening renal function with hyperkalemia. He was recently transitioned to DNR/DNI after extensive discussion with Dr. Eileen Stanford, given that Nephrology and Vascular deemed patient as not a candidate for dialysis. However, patient is not comfort care at this time. He does follow nephrology as an outpatient and is scheduled for a visit later this week.  Given burning with urination and immunocompromised state, patient could potentially have a UTI. Urine dipstick showed moderate hematuria and small leukocytes, which may reflect acute cystitis with hematuria. Urinalysis with reflex and urine culture are pending, but will empirically treat patient with 5-day course of Cefdinir 300mg  daily (renally dosed). He does have an appointment with his nephrologist later this week, but will obtain CBC to assess for leukocytosis and BMP to assess for dramatic worsening of CKD as cause of symptoms.  Plan: -cefdinir 300mg  daily for 5 days for treatment of acute cystitis with hematuria -f/u UA with reflex and urine culture -f/u CBC, BMP -outpatient nephrology visit later this week -f/u in 1 month  ADDENDUM: Urinalysis    Component Value Date/Time   COLORURINE AMBER (A) 04/29/2021 1120   APPEARANCEUR CLOUDY (A) 04/29/2021 1120   APPEARANCEUR Clear 02/13/2017 1050   LABSPEC 1.014 04/29/2021 1120   PHURINE 5.0 04/29/2021 1120   GLUCOSEU NEGATIVE 04/29/2021 1120   HGBUR MODERATE (A) 04/29/2021 1120   BILIRUBINUR Negative 04/29/2021 [place] 04/29/2021  1120   BILIRUBINUR Negative 02/13/2017 1050   KETONESUR NEGATIVE 04/29/2021 1120   PROTEINUR Positive (A) 04/29/2021 1129   PROTEINUR >=300 (A) 04/29/2021 1120   UROBILINOGEN 0.2 04/29/2021 1129   UROBILINOGEN 0.2 08/09/2014 1252   NITRITE Negative 04/29/2021 1129   NITRITE NEGATIVE 04/29/2021 1120   LEUKOCYTESUR Small (1+) (A) 04/29/2021 1129   LEUKOCYTESUR LARGE (A) 04/29/2021 1120   Blood Culture    Component Value Date/Time   SDES URINE, RANDOM 04/29/2021 1120   SPECREQUEST  04/29/2021 1120    NONE Performed at [facility], [place] [address], [place], [place] [address]    CULT >=100,000 COLONIES/mL ESCHERICHIA COLI (A) 04/29/2021 1120   REPTSTATUS 05/01/2021 FINAL 04/29/2021 1120   Patient appears to have an E. coli urinary tract infection.  He is already on a 5-day course of antibiotic therapy with PO cefdinir which this E. coli is sensitive to.  Plan will be to continue and finish the 5-day course of antibiotics.  Can repeat urinalysis at next follow-up visit to ensure resolution.

## 2021-04-29 NOTE — Progress Notes (Signed)
   CC: burning with urination  HPI:  [name]. is a 80 y.o. male with history listed below presenting to the [facility] for burning with urination. Please see individualized problem based charting for full HPI.  Past Medical History:  Diagnosis Date  . Abscess of foot without toes, left 08/28/2017  . Chronic kidney disease (CKD), stage III (moderate) (HCC)   . Constipation 11/07/2014  . Daily headache    "lately" (04/17/2016)  . Depression   . Dizziness 03/07/2016  . Headache 06/26/2014  . Heart murmur   . Hyperkalemia 10/05/2018  . Hyperlipemia 11/11/2016  . Hypertension   . Neuropathic pain 02/06/2015  . Osteomyelitis ([place])   . PAD (peripheral artery disease) ([place])   . Paronychia of great toe, right   . Preop cardiovascular exam   . Right foot pain 02/09/2017  . Stable angina ([place]) 06/27/2014  . Stroke ([place]) 03/2016   hx of PAD; j"ust lots of headaches since" (04/17/2016)  . Vitamin B12 deficiency 06/27/2014  . Weight loss, unintentional 06/27/2014    Review of Systems:  Negative aside from that listed in individualized problem based charting.  Physical Exam:  Vitals:   04/29/21 1041  BP: 129/82  Pulse: (!) 104  Temp: 98.1 F (36.7 C)  TempSrc: Oral  SpO2: 100%  Weight: 193 lb 3.2 oz (87.6 kg)   Physical Exam Constitutional:      Comments: Chronic left hemiballism  HENT:     Head: Normocephalic and atraumatic.     Nose: Nose normal. No congestion.     Mouth/Throat:     Mouth: Mucous membranes are moist.     Pharynx: Oropharynx is clear. No oropharyngeal exudate.  Eyes:     Extraocular Movements: Extraocular movements intact.     Conjunctiva/sclera: Conjunctivae normal.     Pupils: Pupils are equal, round, and reactive to light.  Cardiovascular:     Rate and Rhythm: Normal rate and regular rhythm.     Pulses: Normal pulses.     Heart sounds: Normal heart sounds. No murmur heard. No friction rub. No gallop.   Pulmonary:     Effort: Pulmonary effort is  normal.     Breath sounds: Normal breath sounds. No wheezing, rhonchi or rales.  Abdominal:     General: Bowel sounds are normal. There is no distension.     Palpations: Abdomen is soft.     Tenderness: There is no abdominal tenderness. There is no right CVA tenderness or left CVA tenderness.  Musculoskeletal:        General: Normal range of motion.  Skin:    General: Skin is warm and dry.  Neurological:     General: No focal deficit present.     Mental Status: He is alert and oriented to person, place, and time.  Psychiatric:        Mood and Affect: Mood normal.        Behavior: Behavior normal.      Assessment & Plan:   See Encounters Tab for problem based charting.  Patient discussed with Dr. Philipp Ovens

## 2021-04-29 NOTE — Patient Instructions (Addendum)
Derrick Hendrix,  It was a pleasure seeing you in the clinic today.   1. We got some labs from you today, I will call you with the results.  2. I have prescribed an antibiotic to treat UTI. It is a 5-day course and take one tablet daily.  2. Please make sure to go see your kidney doctor later this week.  Please call our clinic at [phone] if you have any questions or concerns. The best time to call is Monday-Friday from 9am-4pm, but there is someone available 24/7 at the same number. If you need medication refills, please notify your pharmacy one week in advance and they will send [place] a request.   Thank you for letting us take part in your care. We look forward to seeing you next time!

## 2021-04-29 NOTE — Assessment & Plan Note (Addendum)
Derrick Hendrix had an extensive discussion with Dr. Eileen Stanford in 11/2020 in regards to continued worsening renal function with hyperkalemia. He is not a candidate for dialysis as per Nephrology and vascular surgery. After discussion with Dr. Eileen Stanford, Derrick Hendrix was transitioned to DNR/DNI, but he is not currently comfort care. He is being followed by palliative care ([facility]) with last visit on 01/22/21. Should Derrick. Hendrix kidney function continue to deteriorate substantially, he may benefit from end of life discussions with possible inpatient hospice as the next step.   ADDENDUM (05/02/2021): Spoke with Derrick Hendrix about worsening renal function with mild hyperkalemia and discussed that he appears to be progressing to ESRD. He knows that he is not a candidate for hemodialysis. Discussed at length with him about his medical condition and recommended referral to hospice ([facility], which provides him his current palliative care management). He reports that he will talk to his niece today and will have her call the [facility] tomorrow in regards to this.  ADDENDUM (05/03/2021): Spoke with Derrick. Hendrix and his niece, [name] via phone call.  They are aware of Derrick. Hendrix current medical condition.  Discussed at length about benefits of transitioning to hospice.  Both of them agreed that would like for him to be transition to hospice with [facility].  -Placed ambulatory referral to hospice ([facility])

## 2021-04-30 NOTE — Progress Notes (Addendum)
Internal Medicine Clinic Attending  Case discussed with Dr. Allyson Sabal  At the time of the visit.  We reviewed the resident's history and exam and pertinent patient test results.  I agree with the assessment, diagnosis, and plan of care documented in the resident's note.   Patient here with symptoms of UTI and decreased urine production. Urine cultures are growing E coli, sensitivies are pending. WBC elevated to 21. We are treating empirically with renally dosed cefdinir. BMP shows worsening renal function with mild hyperkalemia, he is nearing ESRD. Unfortunately he is not a candidate for hemodialysis. Following with palliative care, currently DNR / DNI. Patient needs to establish with hospice. I will ask Dr. Allyson Sabal to speak with him about prognosis and further GOC when he calls with lab results. Recommend ambulatory referral to hospice ([facility]) if he is agreeable.

## 2021-05-01 LAB — URINE CULTURE: Culture: >=100000 — AB

## 2021-05-03 ENCOUNTER — Encounter (HOSPITAL_COMMUNITY)
Admission: RE | Admit: 2021-05-03 | Discharge: 2021-05-03 | Disposition: A | Discharge status: Home / Self Care | Payer: Medicare HMO | Source: Ambulatory Visit | Attending: Nephrology | Admitting: Nephrology

## 2021-05-03 ENCOUNTER — Other Ambulatory Visit: Payer: Self-pay

## 2021-05-03 ENCOUNTER — Telehealth: Payer: Medicare HMO

## 2021-05-03 VITALS — BP 146/88 | HR 84 | Temp 97.6°F | Resp 20

## 2021-05-03 DIAGNOSIS — N184 Chronic kidney disease, stage 4 (severe): Secondary | ICD-10-CM

## 2021-05-03 LAB — POCT HEMOGLOBIN-HEMACUE: Hemoglobin: 11.1 g/dL — ABNORMAL LOW (ref 13.0–17.0)

## 2021-05-03 MED ORDER — EPOETIN ALFA 10000 UNIT/ML IJ SOLN
INTRAMUSCULAR | Status: AC
  Filled 2021-05-03: qty 1

## 2021-05-03 MED ORDER — EPOETIN ALFA 10000 UNIT/ML IJ SOLN
10000.0000 [IU] | INTRAMUSCULAR | Status: DC
  Administered 2021-05-03: 10000 [IU] via SUBCUTANEOUS

## 2021-05-03 NOTE — Addendum Note (Signed)
Addended by: Virl Axe on: 05/03/2021 11:12 AM   Modules accepted: Orders

## 2021-05-06 ENCOUNTER — Ambulatory Visit: Payer: Medicare HMO | Admitting: *Deleted

## 2021-05-06 DIAGNOSIS — N184 Chronic kidney disease, stage 4 (severe): Secondary | ICD-10-CM

## 2021-05-06 DIAGNOSIS — N183 Chronic kidney disease, stage 3 unspecified: Secondary | ICD-10-CM

## 2021-05-06 DIAGNOSIS — I10 Essential (primary) hypertension: Secondary | ICD-10-CM

## 2021-05-06 DIAGNOSIS — E1122 Type 2 diabetes mellitus with diabetic chronic kidney disease: Secondary | ICD-10-CM

## 2021-05-06 DIAGNOSIS — I63532 Cerebral infarction due to unspecified occlusion or stenosis of left posterior cerebral artery: Secondary | ICD-10-CM

## 2021-05-06 DIAGNOSIS — R339 Retention of urine, unspecified: Secondary | ICD-10-CM

## 2021-05-06 DIAGNOSIS — I480 Paroxysmal atrial fibrillation: Secondary | ICD-10-CM

## 2021-05-06 NOTE — Patient Instructions (Signed)
Visit Information It was nice speaking with you today. Goals Addressed            This Visit's Progress   . Find Help in My Community       Timeframe:  Short-Term Goal Priority:  High Start Date:         05/06/21                    Expected End Date:  06/13/21                     Follow Up Date 06/13/21   - follow-up on any referrals for help I am given    Why is this important?    Knowing how and where to find help for yourself or family in your neighborhood and community is an important skill.   You will want to take some steps to learn how.    Notes: patient has been referred to Hospice and social worker will contact patient about removing feral cats       The patient verbalized understanding of instructions, educational materials, and care plan provided today and declined offer to receive copy of patient instructions, educational materials, and care plan.   The care management team will reach out to the patient again over the next 30-60 days.   Kelli Churn RN, CCM, [facility] RN Care Manager [phone]

## 2021-05-06 NOTE — Chronic Care Management (AMB) (Signed)
Care Management    RN Visit Note  05/06/2021 Name: Derrick Hendrix. MRN: [id number] DOB: Apr 13, 1941  Subjective: Derrick Hendrix. is a 80 y.o. year old male who is a primary care patient of [name], [name], MD. The care management team was consulted for assistance with disease management and care coordination needs.    Engaged with patient by telephone for follow up visit in response to provider referral for case management and/or care coordination services.   Consent to Services:   Mr. Tozzi was given information about Care Management services today including:  1. Care Management services includes personalized support from designated clinical staff supervised by his physician, including individualized plan of care and coordination with other care providers 2. 24/7 contact phone numbers for assistance for urgent and routine care needs. 3. The patient may stop case management services at any time by phone call to the office staff.  Patient agreed to services and consent obtained.   Assessment: Review of patient past medical history, allergies, medications, health status, including review of consultants reports, laboratory and other test data, was performed as part of comprehensive evaluation and provision of chronic care management services.   SDOH (Social Determinants of Health) assessments and interventions performed:    Care Plan  Allergies  Allergen Reactions  . Aspirin Other (See Comments)    Nosebleeds     Outpatient Encounter Medications as of 05/06/2021  Medication Sig  . amLODipine (NORVASC) 10 MG tablet TAKE ONE TABLET BY MOUTH DAILY (Patient taking differently: Take 10 mg by mouth daily.)  . atorvastatin (LIPITOR) 80 MG tablet Take 1 tablet (80 mg total) by mouth daily.  [name] BISACODYL 5 MG EC tablet TAKE 1 TABLET (5 MG TOTAL) BY MOUTH DAILY AS NEEDED FOR MODERATE CONSTIPATION. (Patient taking differently: Take 5 mg by mouth daily as needed for mild constipation.)  .  Blood Pressure Monitoring (BLOOD PRESSURE KIT) DEVI Please check blood pressure twice a day. In the morning and evening. Please record numbers on a sheet of paper. Blood pressure parameters are 120-150/80-90  . calcitRIOL (ROCALTROL) 0.25 MCG capsule Take 0.25 mcg by mouth daily.  . cefdinir (OMNICEF) 300 MG capsule Take 1 capsule (300 mg total) by mouth daily.  . clobetasol cream (TEMOVATE) 0.05 % APPLY TOPICALLY TWO (TWO) TIMES DAILY. (Patient taking differently: Apply 1 application topically 2 (two) times daily.)  . docusate sodium (COLACE) 100 MG capsule Take 1 capsule (100 mg total) by mouth daily.  [name] ELIQUIS 2.5 MG TABS tablet TAKE ONE TABLET BY MOUTH TWICE A DAY  . finasteride (PROSCAR) 5 MG tablet Take 1 tablet (5 mg total) by mouth daily.  . furosemide (LASIX) 40 MG tablet TAKE ONE TABLET BY MOUTH TWICE A DAY  . hydrALAZINE (APRESOLINE) 10 MG tablet TAKE 1.5 TABLETS BY MOUTH THREE TIMES A DAY  . nitroGLYCERIN (NITROSTAT) 0.4 MG SL tablet Place 1 tablet (0.4 mg total) under the tongue every 5 (five) minutes x 3 doses as needed for chest pain.  [name] omeprazole (PRILOSEC) 40 MG capsule Take one capsule shortly before breakfast each morning  . predniSONE (DELTASONE) 10 MG tablet Take 10 mg by mouth daily.   . sodium bicarbonate 650 MG tablet TAKE TWO TABLETS BY MOUTH TWICE A DAY  . sodium polystyrene (KAYEXALATE) 15 GM/60ML suspension TAKE 12O MLS (30GM) BY MOUTH AS A ONE TIME DOSE  . tamsulosin (FLOMAX) 0.4 MG CAPS capsule TAKE TWO CAPSULES BY MOUTH DAILY  . triamcinolone ointment (KENALOG) 0.1 % Apply  1 application topically 2 (two) times daily.  . VELTASSA 8.4 g packet Take 1 packet by mouth daily.   No facility-administered encounter medications on file as of 05/06/2021.    Patient Active Problem List   Diagnosis Date Noted  . Burning with urination 04/29/2021  . Goals of care, counseling/discussion 11/30/2020  . Rhinorrhea 11/29/2020  . Dysphagia 08/23/2020  . Psoriasis 04/23/2020   . Acute on chronic renal failure ([place]) 03/03/2020  . Acute kidney injury superimposed on chronic kidney disease ([place])   . Normal anion gap metabolic acidosis   . Epistaxis 02/20/2020  . Atrial fibrillation ([place]) 10/14/2018  . Type 2 diabetes mellitus ([place]) 09/04/2017  . Hemiballism 08/31/2017  . Ischemic pain of foot, right   . Severe peripheral arterial disease ([place]) 02/27/2017  . Cerebellar infarct ([place]) 05/06/2016  . Benign prostatic hyperplasia with urinary obstruction   . Orthostatic hypotension 04/04/2016  . Urinary retention 03/20/2016  . Cerebrovascular accident (CVA) due to occlusion of left posterior cerebral artery ([place])   . Dizziness 03/07/2016  . Essential hypertension   . Normocytic anemia 10/20/2014  . Hyporeninemic hypoaldosteronism ([place]) 08/24/2014  . Hyperkalemia 08/01/2014  . Healthcare maintenance 07/13/2014  . Atherosclerosis of leg with intermittent claudication, R 06/29/2014  . Current smoker 06/27/2014  . Alcohol use ([place]) 06/27/2014  . CKD (chronic kidney disease), stage IV ([place]) 06/26/2014    Conditions to be addressed/monitored: NIDDM, HTN, HLD, ESRD, A fib, s/p CVA with left sided spasticity   Care Plan : Level of care concerns  Updates made by Barrington Ellison, RN since 05/06/2021 12:00 AM    Problem: Patient needs assistance with ADLS and IADLS.   Priority: High  Onset Date: 11/19/2020    Goal: CCM RN- Self-Management Plan Developed   Start Date: 11/19/2020  Expected End Date: 09/13/2021  Recent Progress: On track  Priority: High  Note:   Current Barriers:  . Care Coordination needs related to alternate housing and/or community assistance in a patient with NIDDM, HTN, HLD, CKD, A fib, s/p CVA with left sided spasticity - spoke with patient via phone to complete follow up assessment, he states he feels well since he was seen at the clinic last week for a urinary tract infection and inability to void that he says was caused by eating chicken nuggets  and french fries, he says a Hospice Nurse is supposed to come to his house next week to talk about their services,  continues to say he wants to stay in his home as until he dies, is asking for assistance in having someone catch and take the 4 outside feral cats that he feeds to a shelter . Dr. Nathanial Rancher discussed Palliative Care with patient and niece on 11/30/20.  Patient consented to referral for services.  . Unable to perform ADLs independently . Unable to perform IADLs independently  Nurse Case Manager Clinical Goal(s):  [name] Over the next 30-60 days, patient will work with CCM clinical social worker to investigate PACE and assisted living facilities  Interventions:  . Inter-disciplinary care team collaboration (see longitudinal plan of care) . Reviewed patient's clinical status related to ESRD and not a candidate for dialysis and services provided by Hospice . Informed patient his problems with urination were not related to the food he ate but rather his end stage kidney disease . Reinforced with patient that his health care team and niece and now Hospice want to honor his end of life wishes and maximize his quality of life  via shared decision making with goal of keeping him comfortable in his home until he dies . Per her request via message left earlier today, called patient's niece [name] and provided update on conversation with patient and texted her the names of patient's primary  care provider and provider that placed Hospice referral  . Will refer to CCM BSW to assist with removal of feral cats . Reinforced the importance of patient notifying his niece or health care team at the time he wishes to stop aggressive care and focus on comfort care   Patient Goals/Self-Care Activities Over the next 30-60 days, patient will:  - work with CCM team to direct shared decision making  Follow Up Plan: The care management team will reach out to the patient again over the next 30-60 days.          Kelli Churn RN, CCM, [facility] RN Care Manager [phone]

## 2021-05-07 ENCOUNTER — Other Ambulatory Visit: Payer: Self-pay | Admitting: Internal Medicine

## 2021-05-07 ENCOUNTER — Ambulatory Visit: Payer: Medicare HMO | Admitting: *Deleted

## 2021-05-07 DIAGNOSIS — I10 Essential (primary) hypertension: Secondary | ICD-10-CM

## 2021-05-07 DIAGNOSIS — R339 Retention of urine, unspecified: Secondary | ICD-10-CM

## 2021-05-07 DIAGNOSIS — N401 Enlarged prostate with lower urinary tract symptoms: Secondary | ICD-10-CM

## 2021-05-07 DIAGNOSIS — N184 Chronic kidney disease, stage 4 (severe): Secondary | ICD-10-CM

## 2021-05-07 DIAGNOSIS — I63532 Cerebral infarction due to unspecified occlusion or stenosis of left posterior cerebral artery: Secondary | ICD-10-CM

## 2021-05-07 DIAGNOSIS — N183 Chronic kidney disease, stage 3 unspecified: Secondary | ICD-10-CM

## 2021-05-07 DIAGNOSIS — I480 Paroxysmal atrial fibrillation: Secondary | ICD-10-CM

## 2021-05-07 DIAGNOSIS — N138 Other obstructive and reflux uropathy: Secondary | ICD-10-CM

## 2021-05-07 DIAGNOSIS — E1122 Type 2 diabetes mellitus with diabetic chronic kidney disease: Secondary | ICD-10-CM

## 2021-05-07 NOTE — Chronic Care Management (AMB) (Signed)
  Care Management   Note  05/07/2021 Name: Derrick Hendrix. MRN: [id number] DOB: Nov 15, 1941  [name]. is enrolled in a Managed Medicaid plan: No. Outreach attempt today was successful.    Patient's  niece is requesting  assistance with securing a notary for patient's healthcare POA forms. She has tried the back and has asked when she takes patient to [place] for his Epocrit injections. Patient has an appointment for another injection on Friday 5/27 at 9:15 in the [place] and niece is hoping a notary can meet with patient in the OP procedure area to notarize the form.   Will refer to CCM BSW For assistance.   Kelli Churn RN, CCM, [facility] RN Care Manager [phone]

## 2021-05-08 ENCOUNTER — Encounter: Payer: Self-pay | Admitting: *Deleted

## 2021-05-09 ENCOUNTER — Ambulatory Visit: Payer: Medicare HMO | Admitting: Licensed Clinical Social Worker

## 2021-05-09 ENCOUNTER — Telehealth: Payer: Self-pay | Admitting: Licensed Clinical Social Worker

## 2021-05-09 NOTE — Chronic Care Management (AMB) (Signed)
  Care Management   Social Work Visit Note  05/09/2021 Name: Derrick Hendrix. MRN: [id number] DOB: 03-Mar-1941  Derrick Hendrix. is a 80 y.o. year old male who sees [name], MD for primary care. The care management team was consulted for assistance with care management and care coordination needs related to Advanced Directive Education   Patient was given the following information about care management and care coordination services today, agreed to services, and gave verbal consent: 1.care management/care coordination services include personalized support from designated clinical staff supervised by their physician, including individualized plan of care and coordination with other care providers 2. 24/7 contact phone numbers for assistance for urgent and routine care needs. 3. The patient may stop care management/care coordination services at any time by phone call to the office staff.  Engaged with patient by telephone for follow up visit in response to provider referral for social work chronic care management and care coordination services.  Assessment: Review of patient history, allergies, and health status during evaluation of patient need for care management/care coordination services.    Interventions:  . Patient needed assistance with a notary for the Advance Directive. SW collaborated with [employer] -[phone] [email or web address]), who agreed to meet the patient downstairs after the patient's 9:15 am appointment at [facility]. SW contacted the patient's niece and advised a second witness is needed. Patients niece reported she didn't have a second witness. SW advised she would collaborate with CCM team to inquire about possible volunteers to assist.   SDOH (Social Determinants of Health) assessments performed: No     Plan:  . No further follow up required: .  Milus Height, [place]  Social Worker [facility] Care Management  [phone]

## 2021-05-09 NOTE — Telephone Encounter (Signed)
Opened in error

## 2021-05-09 NOTE — Patient Instructions (Signed)
Visit Information  Instructions:   Patient was given the following information about care management and care coordination services today, agreed to services, and gave verbal consent: 1.care management/care coordination services include personalized support from designated clinical staff supervised by their physician, including individualized plan of care and coordination with other care providers 2. 24/7 contact phone numbers for assistance for urgent and routine care needs. 3. The patient may stop care management/care coordination services at any time by phone call to the office staff.  Patient verbalizes understanding of instructions provided today and agrees to view in [place].   Telephone follow up appointment with care management team member scheduled for: Within 30 days.   Milus Height, [place]  Social Worker [facility] Care Management  [phone]

## 2021-05-09 NOTE — Patient Instructions (Signed)
Visit Information  Instructions: patient will work with SW to address concerns related to Advance Directive  Patient was given the following information about care management and care coordination services today, agreed to services, and gave verbal consent: 1.care management/care coordination services include personalized support from designated clinical staff supervised by their physician, including individualized plan of care and coordination with other care providers 2. 24/7 contact phone numbers for assistance for urgent and routine care needs. 3. The patient may stop care management/care coordination services at any time by phone call to the office staff.  Patient verbalizes understanding of instructions provided today and agrees to view in [place].   No further follow up required: .  Milus Height, [place]  Social Worker [facility] Care Management  [phone]

## 2021-05-09 NOTE — Chronic Care Management (AMB) (Signed)
  Care Management   Social Work Visit Note  05/09/2021 Name: Constantine Ruddick. MRN: [id number] DOB: 1941/06/20  Cordelle Dahmen. is a 80 y.o. year old male who sees [name], MD for primary care. The care management team was consulted for assistance with care management and care coordination needs related to [facility] Resources    Patient was given the following information about care management and care coordination services today, agreed to services, and gave verbal consent: 1.care management/care coordination services include personalized support from designated clinical staff supervised by their physician, including individualized plan of care and coordination with other care providers 2. 24/7 contact phone numbers for assistance for urgent and routine care needs. 3. The patient may stop care management/care coordination services at any time by phone call to the office staff.  Engaged with patient by telephone for follow up visit in response to provider referral for social work chronic care management and care coordination services.  Assessment: Review of patient history, allergies, and health status during evaluation of patient need for care management/care coordination services.    Interventions:  . Patient interviewed and appropriate assessments performed . Collaborated with clinical team regarding patient needs  . Patient requested assistance with 4 feral cats to be neutered and re-homed. SW contacted several animal and rescue clinics. Agencies reported having a waiting list. SW placed patient on waiting lists.  SDOH (Social Determinants of Health) assessments performed: No     Plan:  . No further follow up required: SW will contact patient within 30-days.  Milus Height, [place]  Social Worker [facility] Care Management  [phone]

## 2021-05-10 ENCOUNTER — Inpatient Hospital Stay (HOSPITAL_COMMUNITY): Admission: RE | Admit: 2021-05-10 | Payer: Medicare HMO | Source: Ambulatory Visit

## 2021-05-14 ENCOUNTER — Ambulatory Visit: Payer: Medicare HMO | Admitting: *Deleted

## 2021-05-14 DIAGNOSIS — I63532 Cerebral infarction due to unspecified occlusion or stenosis of left posterior cerebral artery: Secondary | ICD-10-CM

## 2021-05-14 DIAGNOSIS — I480 Paroxysmal atrial fibrillation: Secondary | ICD-10-CM

## 2021-05-14 DIAGNOSIS — I10 Essential (primary) hypertension: Secondary | ICD-10-CM

## 2021-05-14 DIAGNOSIS — N184 Chronic kidney disease, stage 4 (severe): Secondary | ICD-10-CM

## 2021-05-14 DIAGNOSIS — N138 Other obstructive and reflux uropathy: Secondary | ICD-10-CM

## 2021-05-14 DIAGNOSIS — E1122 Type 2 diabetes mellitus with diabetic chronic kidney disease: Secondary | ICD-10-CM

## 2021-05-14 DIAGNOSIS — N183 Chronic kidney disease, stage 3 unspecified: Secondary | ICD-10-CM

## 2021-05-14 NOTE — Chronic Care Management (AMB) (Signed)
Care Management    RN Visit Note  05/14/2021 Name: Derrick Hendrix. MRN: [id number] DOB: January 23, 1941  Subjective: Derrick Hendrix. is a 80 y.o. year old male who is a primary care patient of Derrick Kanner, MD. The care management team was consulted for assistance with disease management and care coordination needs.    Engaged with patient's niece by telephone for follow up visit in response to provider referral for case management and/or care coordination services.   Consent to Services:   Derrick Hendrix was given information about Care Management services today including:  1. Care Management services includes personalized support from designated clinical staff supervised by his physician, including individualized plan of care and coordination with other care providers 2. 24/7 contact phone numbers for assistance for urgent and routine care needs. 3. The patient may stop case management services at any time by phone call to the office staff.  Patient agreed to services and consent obtained.   Assessment: Review of patient past medical history, allergies, medications, health status, including review of consultants reports, laboratory and other test data, was performed as part of comprehensive evaluation and provision of chronic care management services.   SDOH (Social Determinants of Health) assessments and interventions performed:    Care Plan  Allergies  Allergen Reactions  . Aspirin Other (See Comments)    Nosebleeds     Outpatient Encounter Medications as of 05/14/2021  Medication Sig  . amLODipine (NORVASC) 10 MG tablet TAKE ONE TABLET BY MOUTH DAILY (Patient taking differently: Take 10 mg by mouth daily.)  . atorvastatin (LIPITOR) 80 MG tablet Take 1 tablet (80 mg total) by mouth daily.  [name] BISACODYL 5 MG EC tablet TAKE 1 TABLET (5 MG TOTAL) BY MOUTH DAILY AS NEEDED FOR MODERATE CONSTIPATION. (Patient taking differently: Take 5 mg by mouth daily as needed for mild  constipation.)  . Blood Pressure Monitoring (BLOOD PRESSURE KIT) DEVI Please check blood pressure twice a day. In the morning and evening. Please record numbers on a sheet of paper. Blood pressure parameters are 120-150/80-90  . calcitRIOL (ROCALTROL) 0.25 MCG capsule Take 0.25 mcg by mouth daily.  . cefdinir (OMNICEF) 300 MG capsule Take 1 capsule (300 mg total) by mouth daily.  . clobetasol cream (TEMOVATE) 0.05 % APPLY TOPICALLY TWO (TWO) TIMES DAILY. (Patient taking differently: Apply 1 application topically 2 (two) times daily.)  . docusate sodium (COLACE) 100 MG capsule Take 1 capsule (100 mg total) by mouth daily.  [name] ELIQUIS 2.5 MG TABS tablet TAKE ONE TABLET BY MOUTH TWICE A DAY  . finasteride (PROSCAR) 5 MG tablet Take 1 tablet (5 mg total) by mouth daily.  . furosemide (LASIX) 40 MG tablet TAKE ONE TABLET BY MOUTH TWICE A DAY  . hydrALAZINE (APRESOLINE) 10 MG tablet TAKE 1.5 TABLETS BY MOUTH THREE TIMES A DAY  . nitroGLYCERIN (NITROSTAT) 0.4 MG SL tablet Place 1 tablet (0.4 mg total) under the tongue every 5 (five) minutes x 3 doses as needed for chest pain.  [name] omeprazole (PRILOSEC) 40 MG capsule Take one capsule shortly before breakfast each morning  . predniSONE (DELTASONE) 10 MG tablet Take 10 mg by mouth daily.   . sodium bicarbonate 650 MG tablet TAKE TWO TABLETS BY MOUTH TWICE A DAY  . sodium polystyrene (KAYEXALATE) 15 GM/60ML suspension TAKE 12O MLS (30GM) BY MOUTH AS A ONE TIME DOSE  . tamsulosin (FLOMAX) 0.4 MG CAPS capsule TAKE TWO CAPSULES BY MOUTH DAILY  . triamcinolone ointment (KENALOG) 0.1 % Apply  1 application topically 2 (two) times daily.  . VELTASSA 8.4 g packet Take 1 packet by mouth daily.   No facility-administered encounter medications on file as of 05/14/2021.    Patient Active Problem List   Diagnosis Date Noted  . Burning with urination 04/29/2021  . Goals of care, counseling/discussion 11/30/2020  . Rhinorrhea 11/29/2020  . Dysphagia 08/23/2020  .  Psoriasis 04/23/2020  . Acute on chronic renal failure ([place]) 03/03/2020  . Acute kidney injury superimposed on chronic kidney disease ([place])   . Normal anion gap metabolic acidosis   . Epistaxis 02/20/2020  . Atrial fibrillation ([place]) 10/14/2018  . Type 2 diabetes mellitus ([place]) 09/04/2017  . Hemiballism 08/31/2017  . Ischemic pain of foot, right   . Severe peripheral arterial disease ([place]) 02/27/2017  . Cerebellar infarct ([place]) 05/06/2016  . Benign prostatic hyperplasia with urinary obstruction   . Orthostatic hypotension 04/04/2016  . Urinary retention 03/20/2016  . Cerebrovascular accident (CVA) due to occlusion of left posterior cerebral artery ([place])   . Dizziness 03/07/2016  . Essential hypertension   . Normocytic anemia 10/20/2014  . Hyporeninemic hypoaldosteronism ([place]) 08/24/2014  . Hyperkalemia 08/01/2014  . Healthcare maintenance 07/13/2014  . Atherosclerosis of leg with intermittent claudication, R 06/29/2014  . Current smoker 06/27/2014  . Alcohol use ([place]) 06/27/2014  . CKD (chronic kidney disease), stage IV ([place]) 06/26/2014    Conditions to be addressed/monitored:  NIDDM, HTN, HLD, ESRD, A fib, s/p CVA with left sided spasticity   Care Plan : Level of care concerns  Updates made by Derrick Ellison, RN since 05/14/2021 12:00 AM    Problem: Patient needs assistance with ADLS and IADLS.   Priority: High  Onset Date: 11/19/2020    Goal: CCM RN- Self-Management Plan Developed   Start Date: 11/19/2020  Expected End Date: 09/13/2021  Recent Progress: On track  Priority: High  Note:   Current Barriers:  . Care Coordination needs related to alternate housing and/or community assistance in a patient with NIDDM, HTN, HLD, CKD, A fib, s/p CVA with left sided spasticity - spoke with patient's niece via phone , she states Hospice came to patient's home to complete intake assessment patient told Hospice nurse he did not think he needs Hospice at this time, Hospice RN told  patient's niece Derrick Hendrix he may not qualify for Hospice care at present so he will continue to be followed by Palliative Care,  . Dr. Nathanial Rancher discussed [place] with patient and niece on 11/30/20.  Patient consented to referral for services.  . Unable to perform ADLs independently . Unable to perform IADLs independently  Nurse Case Manager Clinical Goal(s):  [name] Over the next 30-60 days, patient will work with CCM clinical social worker to investigate PACE and assisted living facilities  Interventions:  . Inter-disciplinary care team collaboration (see longitudinal plan of care) . Reviewed patient's clinical status related to ESRD and not a candidate for dialysis and services provided by Hospice . Informed patient his problems with urination were not related to the food he ate but rather his end stage kidney disease . Reinforced with patient that his health care team and niece and now Hospice want to honor his end of life wishes and maximize his quality of life via shared decision making with goal of keeping him comfortable in his home until he dies . Per her request via message left earlier today, called patient's niece Derrick Hendrix and provided update on conversation with patient and texted her the names  of patient's primary  care provider and provider that placed Hospice referral  . Will refer to CCM BSW to assist with removal of feral cats . 05/14/21- Returned call to [place], patient's niece . Reinforced the importance of patient notifying his niece or health care team at the time he wishes to stop aggressive care and focus on comfort care   Patient Goals/Self-Care Activities Over the next 30-60 days, patient will:  - work with CCM team to direct shared decision making  Follow Up Plan: The care management team will reach out to the patient again over the next 30-60 days.         Kelli Churn RN, CCM, [facility] RN Care Manager [phone]

## 2021-05-15 ENCOUNTER — Ambulatory Visit: Payer: Medicare HMO | Admitting: Licensed Clinical Social Worker

## 2021-05-15 NOTE — Patient Instructions (Signed)
Visit Information  Instructions:   Patient was given the following information about care management and care coordination services today, agreed to services, and gave verbal consent: 1.care management/care coordination services include personalized support from designated clinical staff supervised by their physician, including individualized plan of care and coordination with other care providers 2. 24/7 contact phone numbers for assistance for urgent and routine care needs. 3. The patient may stop care management/care coordination services at any time by phone call to the office staff.  Patient verbalizes understanding of instructions provided today and agrees to view in [place].     Derrick Hendrix, [place]  Social Worker [facility] Care Management  [phone]

## 2021-05-15 NOTE — Addendum Note (Signed)
Addended by: Yvonna Alanis E on: 05/15/2021 01:40 PM   Modules accepted: Orders

## 2021-05-15 NOTE — Chronic Care Management (AMB) (Signed)
  Care Management   Social Work Visit Note  05/15/2021 Name: Derrick Hendrix. MRN: [id number] DOB: [birth year]  Derrick Hendrix. is a 80 y.o. year old male who sees [name], MD for primary care. The care management team was consulted for assistance with care management and care coordination needs related to [place] [employer]   Patient was given the following information about care management and care coordination services today, agreed to services, and gave verbal consent: 1.care management/care coordination services include personalized support from designated clinical staff supervised by their physician, including individualized plan of care and coordination with other care providers 2. 24/7 contact phone numbers for assistance for urgent and routine care needs. 3. The patient may stop care management/care coordination services at any time by phone call to the office staff.  SW engaged with the niece Rashi Giuliani) of the patient by telephone for follow up visit in response to provider referral for social work chronic care management and care coordination services.  Assessment: Review of patient history, allergies, and health status during evaluation of patient need for care management/care coordination services.    Interventions:  . SW was informed patient denied hospice services.  . Patient denied services after learning about required visitations by hospice staff.  . Patients niece [name]) encouraged patient to participate in Hospice services. Patient declined.  . Patient said he will wait until he can't do anything for himself.  . SW was informed patient may be in denial and not understand the importance of hospice services.   SDOH (Social Determinants of Health) assessments performed: No     Plan:  . SW will collaborate with  CCM Team.  . SW will follow up within 30-days.  Milus Height, [place]  Social Worker [facility] Care Management   [phone]

## 2021-05-17 ENCOUNTER — Telehealth: Payer: Medicare HMO

## 2021-05-17 ENCOUNTER — Encounter (HOSPITAL_COMMUNITY): Payer: Medicare HMO

## 2021-05-17 ENCOUNTER — Telehealth: Payer: Self-pay | Admitting: *Deleted

## 2021-05-17 NOTE — Telephone Encounter (Addendum)
  Care Management   Outreach Note  05/17/2021 Name: Derrick Hendrix. MRN: [id number] DOB: 29-Dec-1940  Referred by: [name], MD Reason for referral : Care Coordination ( NIDDM, HTN, HLD, ESRD, A fib, s/p CVA with left sided spasticity)   An unsuccessful telephone outreach was attempted today. Purpose of outreach was to discuss Hospice referral. The patient was referred to the case management team for assistance with care management and care coordination.   Follow Up Plan:Text was sent to patient's niece [name] advising her that this CCM RN was unable to reach patient.   The care management team will reach out to the patient again over the next 30 days.   Kelli Churn RN, CCM, [facility] RN Care Manager [phone]

## 2021-05-27 ENCOUNTER — Encounter: Payer: Self-pay | Admitting: Student

## 2021-05-27 ENCOUNTER — Ambulatory Visit (INDEPENDENT_AMBULATORY_CARE_PROVIDER_SITE_OTHER): Payer: Medicare HMO | Admitting: Student

## 2021-05-27 VITALS — BP 177/78 | HR 80 | Temp 98.4°F | Wt 192.5 lb

## 2021-05-27 DIAGNOSIS — Z7189 Other specified counseling: Secondary | ICD-10-CM

## 2021-05-27 DIAGNOSIS — K5900 Constipation, unspecified: Secondary | ICD-10-CM

## 2021-05-27 DIAGNOSIS — E1122 Type 2 diabetes mellitus with diabetic chronic kidney disease: Secondary | ICD-10-CM

## 2021-05-27 DIAGNOSIS — R300 Dysuria: Secondary | ICD-10-CM

## 2021-05-27 DIAGNOSIS — I10 Essential (primary) hypertension: Secondary | ICD-10-CM | POA: Diagnosis not present

## 2021-05-27 DIAGNOSIS — N183 Chronic kidney disease, stage 3 unspecified: Secondary | ICD-10-CM

## 2021-05-27 LAB — GLUCOSE, CAPILLARY: Glucose-Capillary: 90 mg/dL (ref 70–99)

## 2021-05-27 LAB — POCT GLYCOSYLATED HEMOGLOBIN (HGB A1C): Hemoglobin A1C: 5.7 % — AB (ref 4.0–5.6)

## 2021-05-27 MED ORDER — DULCOLAX 5 MG PO TBEC: 5.0000 mg | DELAYED_RELEASE_TABLET | Freq: Every day | ORAL | 0 refills | Status: AC | PRN

## 2021-05-27 NOTE — Patient Instructions (Signed)
Mr.Garrus Charma Igo., it was a pleasure seeing you today!  I have ordered the following labs today:   Lab Orders  Glucose, capillary  POC Hbg A1C    Tests ordered today:  None  Referrals ordered today:   Referral Orders  No referral(s) requested today     I have ordered the following medication/changed the following medications:   Stop the following medications: Medications Discontinued During This Encounter  Medication Reason   BISACODYL 5 MG EC tablet      Start the following medications: Meds ordered this encounter  Medications   bisacodyl (DULCOLAX) 5 MG EC tablet    Sig: Take 1 tablet (5 mg total) by mouth daily as needed for moderate constipation.    Dispense:  30 tablet    Refill:  0     Follow-up:  As needed     Today we discussed: Constipation - I have prescribed Dulcolax for the constipation. If this does not help, please let us know.  Please make sure to arrive 15 minutes prior to your next appointment. If you arrive late, you may be asked to reschedule.   We look forward to seeing you next time. Please call our clinic at [phone] if you have any questions or concerns. The best time to call is Monday-Friday from 9am-4pm, but there is someone available 24/7. If after hours or the weekend, call the main hospital number and ask for the Internal Medicine Resident On-Call. If you need medication refills, please notify your pharmacy one week in advance and they will send [place] a request.  Thank you for letting us take part in your care. Wishing you the best!  Thank you, [name], MD

## 2021-05-28 NOTE — Assessment & Plan Note (Addendum)
BP Readings from Last 3 Encounters:  05/27/21 (!) 177/78  05/03/21 (!) 146/88  04/29/21 129/82   Mr. [name] reports that he takes his antihypertensives as prescribed except on the days he comes to clinic. This is because his medications make him urinate and this is difficult given he takes public transportation. Will continue with his current regimen. - Amlodipine 10mg  daily - Furosemide 40mg  twice daily - Hydralazine 50mg  three times daily

## 2021-05-28 NOTE — Assessment & Plan Note (Signed)
Discussed with Derrick Hendrix today that he continues to see palliative in the outpatient setting. Mentions that hospice nurse comes to his house 1-2 times weekly to check on him. He currently feels like he is doing okay, but knows that his renal disease is terminal has he is not a candidate for dialysis. Will continue to monitor.

## 2021-05-28 NOTE — Progress Notes (Signed)
Internal Medicine Clinic Attending ? ?Case discussed with Dr. Braswell  At the time of the visit.  We reviewed the resident?s history and exam and pertinent patient test results.  I agree with the assessment, diagnosis, and plan of care documented in the resident?s note.  ?

## 2021-05-28 NOTE — Assessment & Plan Note (Addendum)
Derrick Hendrix is presenting to clinic today to discuss his recent constipation. He reports he has not had bowel movement in 3-4 days. He has previously experienced this before, to which this has resolved with dulcolax. Derrick Hendrix currently denies any abdominal pain, nausea, or vomiting. Denies previous melena or blood in stool.   On exam, Derrick Hendrix abdomen is mildly distended, non-tender, non-tympanic percussive sounds.  Per patient preference will prescribe dulcolax for relief of constipation. Patient given instructions to return to clinic if symptoms do not improve. - Start dulcolax 5mg  daily as needed for constipation

## 2021-05-28 NOTE — Progress Notes (Signed)
   CC: constipation  HPI:  [name]. is a 80 y.o. with medical history as listed below presenting to clinic today to discuss constipation.  Please see problem-based list for further details, assessments, and plans.  Past Medical History:  Diagnosis Date   Abscess of foot without toes, left 08/28/2017   Chronic kidney disease (CKD), stage III (moderate) (HCC)    Constipation 11/07/2014   Daily headache    "lately" (04/17/2016)   Depression    Dizziness 03/07/2016   Headache 06/26/2014   Heart murmur    Hyperkalemia 10/05/2018   Hyperlipemia 11/11/2016   Hypertension    Neuropathic pain 02/06/2015   Osteomyelitis ([place])    PAD (peripheral artery disease) ([place])    Paronychia of great toe, right    Preop cardiovascular exam    Right foot pain 02/09/2017   Stable angina ([place]) 06/27/2014   Stroke ([place]) 03/2016   hx of PAD; j"ust lots of headaches since" (04/17/2016)   Vitamin B12 deficiency 06/27/2014   Weight loss, unintentional 06/27/2014   Review of Systems:  As per HPI  Physical Exam:  Vitals:   05/27/21 1009  BP: (!) 177/78  Pulse: 80  Temp: 98.4 F (36.9 C)  TempSrc: Oral  SpO2: 100%  Weight: 192 lb 8 oz (87.3 kg)   General: Sitting in wheelchair comfortably, no acute distress CV: Regular rate, rhythm. No murmurs, rubs, gallops appreciated. Pulm: Normal work of breathing on room air. Clear to auscultation bilaterally. GI: Abdomen soft, mildly-distended, non-tympanic on percussion. Normoactive bowel sounds Neuro: Awake, alert, answering questions appropriately. Psych: Normal mood, affect, speech.  Assessment & Plan:   See Encounters Tab for problem based charting.  Patient discussed with Dr.  Jimmye Norman

## 2021-05-28 NOTE — Assessment & Plan Note (Signed)
Patient presenting today for follow-up from his previous visit. Urine cultures during that visit grew E. Coli resistant to penicillins. Derrick Hendrix reports his symptoms have resolved and he completed his full course of antibiotics. No further work-up needed at this time.

## 2021-05-31 ENCOUNTER — Other Ambulatory Visit: Payer: Self-pay

## 2021-05-31 ENCOUNTER — Ambulatory Visit (HOSPITAL_COMMUNITY)
Admission: RE | Admit: 2021-05-31 | Discharge: 2021-05-31 | Disposition: A | Discharge status: Home / Self Care | Payer: Medicare HMO | Source: Ambulatory Visit | Attending: Nephrology | Admitting: Nephrology

## 2021-05-31 VITALS — BP 169/85 | HR 90 | Temp 98.3°F | Resp 20

## 2021-05-31 DIAGNOSIS — N184 Chronic kidney disease, stage 4 (severe): Secondary | ICD-10-CM | POA: Diagnosis present

## 2021-05-31 LAB — IRON AND TIBC
Iron: 71 ug/dL (ref 45–182)
Saturation Ratios: 22 % (ref 17.9–39.5)
TIBC: 325 ug/dL (ref 250–450)
UIBC: 254 ug/dL

## 2021-05-31 LAB — POCT HEMOGLOBIN-HEMACUE: Hemoglobin: 10.6 g/dL — ABNORMAL LOW (ref 13.0–17.0)

## 2021-05-31 LAB — FERRITIN: Ferritin: 269 ng/mL (ref 24–336)

## 2021-05-31 MED ORDER — EPOETIN ALFA 10000 UNIT/ML IJ SOLN
INTRAMUSCULAR | Status: AC
  Filled 2021-05-31: qty 1

## 2021-05-31 MED ORDER — EPOETIN ALFA 10000 UNIT/ML IJ SOLN
10000.0000 [IU] | INTRAMUSCULAR | Status: DC
  Administered 2021-05-31: 10000 [IU] via SUBCUTANEOUS

## 2021-06-05 ENCOUNTER — Other Ambulatory Visit: Payer: Self-pay | Admitting: Internal Medicine

## 2021-06-05 ENCOUNTER — Other Ambulatory Visit (HOSPITAL_COMMUNITY): Payer: Self-pay | Admitting: Nurse Practitioner

## 2021-06-05 ENCOUNTER — Other Ambulatory Visit: Payer: Self-pay | Admitting: Student

## 2021-06-05 DIAGNOSIS — I10 Essential (primary) hypertension: Secondary | ICD-10-CM

## 2021-06-05 NOTE — Telephone Encounter (Signed)
Prescription refill request for Eliquis received. Indication: A Fib/CVA Last office visit: 12/19/20  J. Allred MD Scr: 5.24 on 04/29/21 Age: 80 Weight: 92.5kg  Based on above findings Eliquis 2.5mg  twice daily is the appropriate dose.  Refill approved.

## 2021-06-07 ENCOUNTER — Encounter (HOSPITAL_COMMUNITY): Payer: Medicare HMO

## 2021-06-07 ENCOUNTER — Encounter (HOSPITAL_COMMUNITY)
Admission: RE | Admit: 2021-06-07 | Discharge: 2021-06-07 | Disposition: A | Discharge status: Home / Self Care | Payer: Medicare HMO | Source: Ambulatory Visit | Attending: Nephrology | Admitting: Nephrology

## 2021-06-07 ENCOUNTER — Other Ambulatory Visit: Payer: Self-pay

## 2021-06-07 VITALS — BP 149/101 | HR 79 | Temp 97.6°F | Resp 20

## 2021-06-07 DIAGNOSIS — N184 Chronic kidney disease, stage 4 (severe): Secondary | ICD-10-CM | POA: Diagnosis not present

## 2021-06-07 LAB — POCT HEMOGLOBIN-HEMACUE: Hemoglobin: 10.7 g/dL — ABNORMAL LOW (ref 13.0–17.0)

## 2021-06-07 MED ORDER — EPOETIN ALFA 10000 UNIT/ML IJ SOLN
INTRAMUSCULAR | Status: AC
  Filled 2021-06-07: qty 1

## 2021-06-07 MED ORDER — EPOETIN ALFA 10000 UNIT/ML IJ SOLN
10000.0000 [IU] | INTRAMUSCULAR | Status: DC
  Administered 2021-06-07: 10000 [IU] via SUBCUTANEOUS

## 2021-06-11 ENCOUNTER — Telehealth: Payer: Medicare HMO

## 2021-06-13 ENCOUNTER — Ambulatory Visit: Payer: Medicare HMO | Admitting: Licensed Clinical Social Worker

## 2021-06-13 ENCOUNTER — Telehealth: Payer: Self-pay | Admitting: Licensed Clinical Social Worker

## 2021-06-13 NOTE — Telephone Encounter (Signed)
  Care Management   Follow Up Note   06/13/2021 Name: Derrick Hendrix. MRN: [id number] DOB: Apr 27, 1941   Referred by: [name], MD Reason for referral : No chief complaint on file.   An unsuccessful telephone outreach was attempted today. The patient was referred to the case management team for assistance with care management and care coordination.   Follow Up Plan: The care management team will reach out to the patient again over the next 30 days.   Milus Height, [place]  Social Worker [facility] Care Management  [phone]

## 2021-06-13 NOTE — Patient Instructions (Signed)
Visit Information  Instructions: patient will work with SW to address concerns related to level of care.  Patient was given the following information about care management and care coordination services today, agreed to services, and gave verbal consent: 1.care management/care coordination services include personalized support from designated clinical staff supervised by their physician, including individualized plan of care and coordination with other care providers 2. 24/7 contact phone numbers for assistance for urgent and routine care needs. 3. The patient may stop care management/care coordination services at any time by phone call to the office staff.  Patient verbalizes understanding of instructions provided today and agrees to view in [place].   Telephone follow up appointment with care management team member scheduled for:07/11/2021 at 9:15 am  Milus Height, [place]  Social Worker [facility] Care Management  [phone]

## 2021-06-13 NOTE — Chronic Care Management (AMB) (Signed)
  Care Management   Social Work Visit Note  06/13/2021 Name: Derrick Hendrix. MRN: [id number] DOB: September 17, 1941  Derrick Hendrix. is a 80 y.o. year old male who sees [name], MD for primary care. The care management team was consulted for assistance with care management and care coordination needs related to [facility] Resources    Patient was given the following information about care management and care coordination services today, agreed to services, and gave verbal consent: 1.care management/care coordination services include personalized support from designated clinical staff supervised by their physician, including individualized plan of care and coordination with other care providers 2. 24/7 contact phone numbers for assistance for urgent and routine care needs. 3. The patient may stop care management/care coordination services at any time by phone call to the office staff.  Engaged with patient by telephone for follow up visit in response to provider referral for social work chronic care management and care coordination services.  Assessment: Review of patient history, allergies, and health status during evaluation of patient need for care management/care coordination services.    Interventions:  Patient interviewed and appropriate assessments performed Collaborated with clinical team regarding patient needs  SW spoke with patient niece on today- Jonathin Heinicke. Ms. Lastinger reports patient is taking his medications, eating and reporting feeling well. SW advised the next appointment with RN CM is on 06/18/2021 at 9:15  am.  Ms. Schewe reports patients is sleeping a lot more, and may not here the phone. Ms. Loveday advised calls to be made to her.  SW discussed cats. SW continues to contact animal clinics. SW placed patient cats on a waiting list previously. Ms. Daywalt also stated the family would assist with cats.    SDOH (Social Determinants of Health) assessments performed: Yes      Plan:  patient will work with BSW to address needs related to Level of care concerns SW scheduled next appointment for 07/11/2021 at 9:30 am.  SW will follow up with status of waiting list for cats.   Milus Height, [place]  Social Worker [facility] Care Management  [phone]

## 2021-06-14 ENCOUNTER — Encounter (HOSPITAL_COMMUNITY)
Admission: RE | Admit: 2021-06-14 | Discharge: 2021-06-14 | Disposition: A | Discharge status: Home / Self Care | Payer: Medicare HMO | Source: Ambulatory Visit | Attending: Nephrology | Admitting: Nephrology

## 2021-06-14 ENCOUNTER — Encounter (HOSPITAL_COMMUNITY): Payer: Medicare HMO

## 2021-06-14 ENCOUNTER — Ambulatory Visit: Payer: Medicare HMO | Admitting: Licensed Clinical Social Worker

## 2021-06-14 ENCOUNTER — Other Ambulatory Visit: Payer: Self-pay

## 2021-06-14 VITALS — BP 149/72 | HR 82 | Temp 97.3°F | Resp 20

## 2021-06-14 DIAGNOSIS — N184 Chronic kidney disease, stage 4 (severe): Secondary | ICD-10-CM | POA: Insufficient documentation

## 2021-06-14 LAB — POCT HEMOGLOBIN-HEMACUE: Hemoglobin: 10.6 g/dL — ABNORMAL LOW (ref 13.0–17.0)

## 2021-06-14 MED ORDER — EPOETIN ALFA 10000 UNIT/ML IJ SOLN
10000.0000 [IU] | INTRAMUSCULAR | Status: DC
  Administered 2021-06-14: 10000 [IU] via SUBCUTANEOUS

## 2021-06-14 MED ORDER — EPOETIN ALFA 10000 UNIT/ML IJ SOLN
INTRAMUSCULAR | Status: AC
  Filled 2021-06-14: qty 1

## 2021-06-18 ENCOUNTER — Ambulatory Visit: Payer: Medicare HMO | Admitting: *Deleted

## 2021-06-18 DIAGNOSIS — I10 Essential (primary) hypertension: Secondary | ICD-10-CM

## 2021-06-18 DIAGNOSIS — N401 Enlarged prostate with lower urinary tract symptoms: Secondary | ICD-10-CM

## 2021-06-18 DIAGNOSIS — N184 Chronic kidney disease, stage 4 (severe): Secondary | ICD-10-CM

## 2021-06-18 DIAGNOSIS — N183 Chronic kidney disease, stage 3 unspecified: Secondary | ICD-10-CM

## 2021-06-18 DIAGNOSIS — N138 Other obstructive and reflux uropathy: Secondary | ICD-10-CM

## 2021-06-18 DIAGNOSIS — I480 Paroxysmal atrial fibrillation: Secondary | ICD-10-CM

## 2021-06-18 DIAGNOSIS — I63532 Cerebral infarction due to unspecified occlusion or stenosis of left posterior cerebral artery: Secondary | ICD-10-CM

## 2021-06-18 NOTE — Chronic Care Management (AMB) (Signed)
Care Management    RN Visit Note  06/18/2021 Name: Derrick Hendrix. MRN: [id number] DOB: [birth year]  Subjective: Derrick Hendrix. is a 80 y.o. year old male who is a primary care patient of Derrick Kanner, MD. The care management team was consulted for assistance with disease management and care coordination needs.    Engaged with patient by telephone for follow up visit in response to provider referral for case management and/or care coordination services.   Consent to Services:   [name] was given information about Care Management services today including:  Care Management services includes personalized support from designated clinical staff supervised by his physician, including individualized plan of care and coordination with other care providers 24/7 contact phone numbers for assistance for urgent and routine care needs. The patient may stop case management services at any time by phone call to the office staff.  Patient agreed to services and consent obtained.   Assessment: Review of patient past medical history, allergies, medications, health status, including review of consultants reports, laboratory and other test data, was performed as part of comprehensive evaluation and provision of chronic care management services.   SDOH (Social Determinants of Health) assessments and interventions performed:    Care Plan  Allergies  Allergen Reactions   Aspirin Other (See Comments)    Nosebleeds     Outpatient Encounter Medications as of 06/18/2021  Medication Sig   amLODipine (NORVASC) 10 MG tablet TAKE ONE TABLET BY MOUTH DAILY (Patient taking differently: Take 10 mg by mouth daily.)   apixaban (ELIQUIS) 2.5 MG TABS tablet TAKE ONE TABLET BY MOUTH TWICE A DAY   atorvastatin (LIPITOR) 80 MG tablet Take 1 tablet (80 mg total) by mouth daily.   bisacodyl (DULCOLAX) 5 MG EC tablet Take 1 tablet (5 mg total) by mouth daily as needed for moderate constipation.   Blood Pressure  Monitoring (BLOOD PRESSURE KIT) DEVI Please check blood pressure twice a day. In the morning and evening. Please record numbers on a sheet of paper. Blood pressure parameters are 120-150/80-90   calcitRIOL (ROCALTROL) 0.25 MCG capsule Take 0.25 mcg by mouth daily.   cefdinir (OMNICEF) 300 MG capsule Take 1 capsule (300 mg total) by mouth daily.   clobetasol cream (TEMOVATE) 0.05 % APPLY TOPICALLY TWO (TWO) TIMES DAILY. (Patient taking differently: Apply 1 application topically 2 (two) times daily.)   docusate sodium (COLACE) 100 MG capsule Take 1 capsule (100 mg total) by mouth daily.   finasteride (PROSCAR) 5 MG tablet Take 1 tablet (5 mg total) by mouth daily.   furosemide (LASIX) 40 MG tablet TAKE ONE TABLET BY MOUTH TWICE A DAY   hydrALAZINE (APRESOLINE) 10 MG tablet TAKE 1.5 TABLETS BY MOUTH THREE TIMES A DAY   nitroGLYCERIN (NITROSTAT) 0.4 MG SL tablet Place 1 tablet (0.4 mg total) under the tongue every 5 (five) minutes x 3 doses as needed for chest pain.   omeprazole (PRILOSEC) 40 MG capsule Take one capsule shortly before breakfast each morning   predniSONE (DELTASONE) 10 MG tablet Take 10 mg by mouth daily.    sodium bicarbonate 650 MG tablet TAKE TWO TABLETS BY MOUTH TWICE A DAY   sodium polystyrene (KAYEXALATE) 15 GM/60ML suspension TAKE 12O MLS (30GM) BY MOUTH AS A ONE TIME DOSE   tamsulosin (FLOMAX) 0.4 MG CAPS capsule TAKE TWO CAPSULES BY MOUTH DAILY   triamcinolone ointment (KENALOG) 0.1 % Apply 1 application topically 2 (two) times daily.   VELTASSA 8.4 g packet Take 1 packet  by mouth daily.   No facility-administered encounter medications on file as of 06/18/2021.    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   Ischemic pain of foot, right    Severe peripheral arterial disease (HCC) 02/27/2017   Cerebellar infarct (HCC) 05/06/2016   Benign prostatic hyperplasia with urinary obstruction    Orthostatic hypotension 04/04/2016   Urinary retention 03/20/2016   Cerebrovascular accident (CVA) due to occlusion of left posterior cerebral artery (HCC)    Dizziness 03/07/2016   Constipation 11/07/2014   Essential hypertension    Normocytic anemia 10/20/2014   Hyporeninemic hypoaldosteronism (HCC) 08/24/2014   Hyperkalemia 08/01/2014   Healthcare maintenance 07/13/2014   Atherosclerosis of leg with intermittent claudication, R 06/29/2014   Current smoker 06/27/2014   Alcohol use (HCC) 06/27/2014   CKD (chronic kidney disease), stage IV (HCC) 06/26/2014    Conditions to be addressed/monitored: NIDDM, HTN, HLD, ESRD- not a dialysis candidiate, A fib, s/p CVA with left sided spasticity   Care Plan : CCM RN- Level of care concerns  Updates made by Bary Richard, RN since 06/18/2021 12:00 AM     Problem: Patient needs assistance with ADLS and IADLS.   Priority: High  Onset Date: 11/19/2020     Goal: CCM RN- Self-Management Plan Developed   Start Date: 11/19/2020  Expected End Date: 09/13/2021  Recent Progress: On track  Priority: High  Note:   Current Barriers:  Care Coordination needs related to alternate housing and/or community assistance in a patient with NIDDM, HTN, HLD, CKD, A fib, s/p CVA with left sided spasticity - spoke with patient via phone, states he id doing "OK", but says his house does not have air conditioning and his fan no longer works, reports good medication taking behavior, says he has not heard from anyone other than the clinic BSW re: removal of his feral cats, says he will arrange for [facility]  (now Derrick Hendrix) to take him to his appointment on 7/8 for his Epogen injection Derrick Hendrix discussed Palliative Care with patient and niece on 11/30/20.  Patient consented to referral for services.  Unable to perform ADLs independently Unable to perform IADLs independently  Nurse Case Manager Clinical Goal(s):  Over the next 30-60 days, patient will work with CCM clinical social worker to investigate PACE and assisted living facilities  Interventions:  Inter-disciplinary care team collaboration (see longitudinal plan of care) Reviewed patient's clinical status related to ESRD and not a candidate for dialysis and services provided by Hospice Informed patient his problems with urination were not related to the food he ate but rather his end stage kidney disease Reinforced with patient that his health care team and niece and now Hospice want to honor his end of life wishes and maximize his quality of life via shared decision making with goal of keeping him comfortable in his home until he dies Per her request via message left earlier today, called patient's niece [facility] and provided update on conversation with patient and texted her the names of patient's primary  care provider and provider that placed Hospice referral  Will refer to CCM BSW to assist with removal of feral cats 05/14/21- Returned call to [name], patient's niece Reinforced the importance of patient notifying his niece or health care team at the time he wishes to stop aggressive care and focus on comfort care only 06/18/21- Referral to community care guide to secure fan for patient's home 06/18/21- Advised patient the role of CCM RN will transition from this RNCM to [place] RN   Patient Goals/Self-Care Activities Over the next 30-60 days, patient will:  - work with CCM team to direct shared decision making  Follow Up Plan: The care management team will reach out to the patient again over the next 30-60 days.          Plan: The care management team will reach out to the patient again over the next 30 days.  Kelli Churn RN, CCM, [facility] RN Care Manager [phone]

## 2021-06-18 NOTE — Addendum Note (Signed)
Addended by: Barrington Ellison on: 06/18/2021 11:36 AM   Modules accepted: Orders

## 2021-06-18 NOTE — Patient Instructions (Signed)
Visit Information It was nice speaking with you today.   Goals Addressed             This Visit's Progress    Find Help in My Community       Timeframe:  Short-Term Goal Priority:  High Start Date:         05/06/21                    Expected End Date:  06/13/21                     Follow Up Date 06/13/21   - follow-up on any referrals for help I am given    Why is this important?   Knowing how and where to find help for yourself or family in your neighborhood and community is an important skill.  You will want to take some steps to learn how.    Notes: patient refused Hospice services; clinic BSW to assist with removing feral cats 06/18/21- referred to community care guide to assist patient with getting fan as he has no air conditioning in his home         The patient verbalized understanding of instructions, educational materials, and care plan provided today and declined offer to receive copy of patient instructions, educational materials, and care plan.   The care management team will reach out to the patient again over the next 30 days.   Kelli Churn RN, CCM, [facility] RN Care Manager [phone]

## 2021-06-19 ENCOUNTER — Other Ambulatory Visit: Payer: Self-pay | Admitting: Student

## 2021-06-19 ENCOUNTER — Telehealth: Payer: Self-pay | Admitting: *Deleted

## 2021-06-19 DIAGNOSIS — N183 Chronic kidney disease, stage 3 unspecified: Secondary | ICD-10-CM

## 2021-06-19 DIAGNOSIS — E1122 Type 2 diabetes mellitus with diabetic chronic kidney disease: Secondary | ICD-10-CM

## 2021-06-19 NOTE — Chronic Care Management (AMB) (Signed)
  Care Management   Social Work Visit Note  06/19/2021 Name: Derrick Hendrix. MRN: [id number] DOB: 1941/08/19  Derrick Hendrix. is a 80 y.o. year old male who sees [name], MD for primary care. The care management team was consulted for assistance with care management and care coordination needs related to [facility] Resources    Patient was given the following information about care management and care coordination services today, agreed to services, and gave verbal consent: 1.care management/care coordination services include personalized support from designated clinical staff supervised by their physician, including individualized plan of care and coordination with other care providers 2. 24/7 contact phone numbers for assistance for urgent and routine care needs. 3. The patient may stop care management/care coordination services at any time by phone call to the office staff.   Assessment: Review of patient history, allergies, and health status during evaluation of patient need for care management/care coordination services.    Interventions:   SW contacted multiple animal shelters. Agencies stated patient will be placed on waiting list to assist with [place] cats.     Plan:  Social Worker will continue to make contact with Teacher, English as a foreign language.   Milus Height, [place]  Social Worker [facility] Care Management  [phone]

## 2021-06-19 NOTE — Telephone Encounter (Signed)
   Telephone encounter was:  Unsuccessful.  06/19/2021 Name: Derrick Hendrix. MRN: [id number] DOB: 12-Apr-1941  Unsuccessful outbound call made today to assist with:   a fan   Outreach Attempt:  1st Attempt  A HIPAA compliant voice message was left requesting a return call.  Instructed patient to call back at   Instructed patient to call back at [phone]  at their earliest convenience.   [place], Care Management  [phone] [address] , [place] [address] Email : [email or web address]

## 2021-06-19 NOTE — Telephone Encounter (Signed)
   Telephone encounter was:  Successful.  06/19/2021 Name: Derrick Hendrix. MRN: [id number] DOB: 10/21/1941  Derrick Hendrix. is a 80 y.o. year old male who is a primary care patient of [name], MD . The community resource team was consulted for assistance with Patient niece reached out to me after message left sending nformation on aging gracefully , looking for fan for patient , also needs diabetic shoes , have messaged providerd  Care guide performed the following interventions: Patient provided with information about care guide support team and interviewed to confirm resource needs Follow up call placed to community resources to determine status of patients referral.  Follow Up Plan:  Care guide will follow up with patient by phone over the next day [place], Care Management  [phone] [address] , [place] [address] Email : [email or web address]

## 2021-06-20 ENCOUNTER — Telehealth: Payer: Self-pay | Admitting: *Deleted

## 2021-06-20 NOTE — Telephone Encounter (Signed)
   Telephone encounter was:  Successful.  06/20/2021 Name: Derrick Hendrix. MRN: [id number] DOB: 08-Apr-1941  [name] is a 80 y.o. year old male who is a primary care patient of [name], MD . The community resource team was consulted for assistance with Got a fan from senior resources of [place] and sending niece information aging gracefully program   Care guide performed the following interventions: Patient provided with information about care guide support team and interviewed to confirm resource needs Follow up call placed to community resources to determine status of patients referral.  Follow Up Plan:  No further follow up planned at this time. The patient has been provided with needed resources. [place], Care Management  [phone] [address] , [place] [address] Email : [email or web address]

## 2021-06-21 ENCOUNTER — Encounter (HOSPITAL_COMMUNITY)
Admission: RE | Admit: 2021-06-21 | Discharge: 2021-06-21 | Disposition: A | Discharge status: Home / Self Care | Payer: Medicare HMO | Source: Ambulatory Visit | Attending: Nephrology | Admitting: Nephrology

## 2021-06-21 ENCOUNTER — Other Ambulatory Visit: Payer: Self-pay

## 2021-06-21 VITALS — BP 156/94 | HR 85 | Temp 98.0°F | Resp 19

## 2021-06-21 DIAGNOSIS — N184 Chronic kidney disease, stage 4 (severe): Secondary | ICD-10-CM

## 2021-06-21 LAB — POCT HEMOGLOBIN-HEMACUE: Hemoglobin: 11.5 g/dL — ABNORMAL LOW (ref 13.0–17.0)

## 2021-06-21 MED ORDER — EPOETIN ALFA 10000 UNIT/ML IJ SOLN
INTRAMUSCULAR | Status: AC
  Filled 2021-06-21: qty 1

## 2021-06-21 MED ORDER — EPOETIN ALFA 10000 UNIT/ML IJ SOLN
10000.0000 [IU] | INTRAMUSCULAR | Status: DC
  Administered 2021-06-21: 10000 [IU] via SUBCUTANEOUS

## 2021-06-28 ENCOUNTER — Encounter (HOSPITAL_COMMUNITY)
Admission: RE | Admit: 2021-06-28 | Discharge: 2021-06-28 | Disposition: A | Discharge status: Home / Self Care | Payer: Medicare HMO | Source: Ambulatory Visit | Attending: Nephrology | Admitting: Nephrology

## 2021-06-28 VITALS — BP 145/96 | HR 95 | Temp 97.3°F | Resp 20

## 2021-06-28 DIAGNOSIS — N184 Chronic kidney disease, stage 4 (severe): Secondary | ICD-10-CM

## 2021-06-28 LAB — IRON AND TIBC
Iron: 57 ug/dL (ref 45–182)
Saturation Ratios: 19 % (ref 17.9–39.5)
TIBC: 304 ug/dL (ref 250–450)
UIBC: 247 ug/dL

## 2021-06-28 LAB — FERRITIN: Ferritin: 174 ng/mL (ref 24–336)

## 2021-06-28 LAB — POCT HEMOGLOBIN-HEMACUE: Hemoglobin: 12.1 g/dL — ABNORMAL LOW (ref 13.0–17.0)

## 2021-06-28 MED ORDER — EPOETIN ALFA 10000 UNIT/ML IJ SOLN: 10000.0000 [IU] | INTRAMUSCULAR | Status: DC

## 2021-07-05 ENCOUNTER — Encounter (HOSPITAL_COMMUNITY): Payer: Medicare HMO

## 2021-07-08 ENCOUNTER — Telehealth: Payer: Self-pay | Admitting: *Deleted

## 2021-07-08 ENCOUNTER — Other Ambulatory Visit: Payer: Self-pay | Admitting: Internal Medicine

## 2021-07-08 DIAGNOSIS — I10 Essential (primary) hypertension: Secondary | ICD-10-CM

## 2021-07-08 NOTE — Telephone Encounter (Signed)
  Care Management   Note  07/08/2021 Name: Derrick Hendrix. MRN: [id number] DOB: 31-Jul-1941  Derrick Hendrix. is enrolled in a Managed Medicaid plan: No. Outreach attempt today to patient's niece [name] was successful.  Informed her that the role of the CCM RN for the [facility] Center has transitioned to Johnney Killian and that she can be reached by calling the clinic and requesting to speak with the CCM RNCM. Advised her patient was informed of the transition via phone on 06/18/21. Also thanked [name] for continuing to assist with patient's care and advocating for his needs. Tracey voiced appreciation for this CCM RN's assistance,   The care management team will reach out to the patient again over the next 30 days.   Kelli Churn RN, CCM, [facility] RN Care Manager [phone]

## 2021-07-11 ENCOUNTER — Ambulatory Visit: Payer: Medicare HMO

## 2021-07-11 ENCOUNTER — Ambulatory Visit: Payer: Medicare HMO | Admitting: Licensed Clinical Social Worker

## 2021-07-11 ENCOUNTER — Telehealth: Payer: Self-pay | Admitting: Licensed Clinical Social Worker

## 2021-07-11 NOTE — Patient Instructions (Signed)
Visit Information   Goals Addressed               This Visit's Progress     "I'll get up with my niece and fill out those forms you are talking about ( advance directive and health care power of attorney) " (pt-stated)        [place] (see longitudinal plan of care for additional care plan information)  Current Barriers:  Limited education about the importance of naming a healthcare power of attorney  Clinical Social Work Clinical Goal(s):  Over the next 30 days, patient will verbalize basic understanding of Advanced Directives and importance of completion Over the next 30 days, the patient will complete mailed Advance Directive packet with the assistance of CCM Team Over the next 43- 90 days, the patient will have Advance Directive notarized and provide a copy to his provider   Patient Self Care Activities:  Can read and write at 6th grading reading level Is able to complete documentation independently Is able to readily make contact with social support system Can identify next of kin, power or attorney, guardian, or primary caregiver Attends all scheduled provider appointments Calls provider office for new concerns or questions Performs ADLs independently  Please see past updates related to this goal by clicking on the "Past Updates" button in the selected goal         COMPLETED: 'I don't know what foods have a lot of potassium in them." (pt-stated)        [place] (see longitudinal plan of care for additional care plan information)  Current Barriers:  Chronic Disease Management support and education needs related to HTN and CKD  and hyporeninemic hypoaldosteronism  Nurse Case Manager Clinical Goal(s):  Over the next 30- 60 days, patient will verbalize basic understanding of HTN , CKD and  hyporeninemic hypoaldosteronism disease process and self health management plan as evidenced by voicing understanding of any medication changes , taking medications as  prescribed and following recommended diet  Interventions:  Appropriate assessments  completed  Assessed medication taking behavior Provided opportunity for patient to ask questions about the information mailed to him on high K+ foods so he knows those to avoid Encouraged him to eat well balanced meals and only avoid those foods on the list  Reviewed upcoming appointments and ensured he has transportation  Patient Self Care Activities:  Self administers medications as prescribed Attends all scheduled provider appointments Calls provider office for new concerns or questions Patient voices understanding of any medication changes and changes to pill packs Patient voices understanding of diet recommendations Unable to perform IADLs independently- uses [facility] for transportation  Please see past updates related to this goal by clicking on the "Past Updates" button in the selected goal        Find Help in My Community        Timeframe:  Short-Term Goal Priority:  High Start Date:         05/06/21                    Expected End Date:  09/13/21                     Follow Up Date 09/13/21   - follow-up on any referrals for help I am given    Why is this important?   Knowing how and where to find help for yourself or family in your neighborhood and community is an  important skill.  You will want to take some steps to learn how.    Notes: patient refused Hospice services        The patient verbalized understanding of instructions, educational materials, and care plan provided today and declined offer to receive copy of patient instructions, educational materials, and care plan.   Telephone follow up appointment with care management team member scheduled for:08/08/21@0915   Johnney Killian, RN, BSN, CCM Care Management Coordinator [facility] Internal Medicine Phone: [phone] / Fax: [phone]

## 2021-07-11 NOTE — Patient Instructions (Signed)
Visit Information  Instructions: patient will work with SW to address concerns related to Animal Care  Patient was given the following information about care management and care coordination services today, agreed to services, and gave verbal consent: 1.care management/care coordination services include personalized support from designated clinical staff supervised by their physician, including individualized plan of care and coordination with other care providers 2. 24/7 contact phone numbers for assistance for urgent and routine care needs. 3. The patient may stop care management/care coordination services at any time by phone call to the office staff.  Patient verbalizes understanding of instructions provided today and agrees to view in [place].   The care management team will reach out to the patient again over the next 30 days.  Milus Height, [place]  Social Worker [facility] Care Management  [phone]

## 2021-07-11 NOTE — Chronic Care Management (AMB) (Signed)
  Care Management   Social Work Visit Note  07/11/2021 Name: Derrick Hendrix. MRN: [id number] DOB: 1941-05-18  Derrick Hendrix. is a 80 y.o. year old male who sees [name], MD for primary care. The care management team was consulted for assistance with care management and care coordination needs related to [facility] Resources    Patient was given the following information about care management and care coordination services today, agreed to services, and gave verbal consent: 1.care management/care coordination services include personalized support from designated clinical staff supervised by their physician, including individualized plan of care and coordination with other care providers 2. 24/7 contact phone numbers for assistance for urgent and routine care needs. 3. The patient may stop care management/care coordination services at any time by phone call to the office staff.  Engaged with patient by telephone for follow up visit in response to provider referral for social work chronic care management and care coordination services.  Assessment: Review of patient history, allergies, and health status during evaluation of patient need for care management/care coordination services.    Interventions:  SW spoke with Niece- Mattheu Brodersen. Ms. Chavarin stated the patient is doing well, but in the previous weeks experienced constipation. Patients constipation has resolved with use of Prune Juice. Ms. Boehringer requested a call from [facility]. Patient is in need of assistance to home his feral cats. Sw is still working on a solution as Teacher, English as a foreign language are full.  Ms. Tucholski is concerned regarding patient being at his current home. Patient has denied hospice care and denied leaving his current home.  SW contacted animal shelters on today and left a VM.       Plan:  Social Worker will continue to [employer], research (life sciences) shelters.   Milus Height, [place]  Social Worker [facility] Care Management  [phone]

## 2021-07-11 NOTE — Chronic Care Management (AMB) (Signed)
Care Management    RN Visit Note  07/11/2021 Name: Derrick Hendrix. MRN: [id number] DOB: Jul 18, 1941  Subjective: Derrick Hendrix. is a 80 y.o. year old male who is a primary care patient of [name], MD. The care management team was consulted for assistance with disease management and care coordination needs.    Engaged with patient by telephone for follow up visit in response to provider referral for case management and/or care coordination services.   Consent to Services:   Mr. [name] was given information about Care Management services today including:  Care Management services includes personalized support from designated clinical staff supervised by his physician, including individualized plan of care and coordination with other care providers 24/7 contact phone numbers for assistance for urgent and routine care needs. The patient may stop case management services at any time by phone call to the office staff.  Patient agreed to services and consent obtained.   Assessment: Review of patient past medical history, allergies, medications, health status, including review of consultants reports, laboratory and other test data, was performed as part of comprehensive evaluation and provision of chronic care management services.   SDOH (Social Determinants of Health) assessments and interventions performed:    Care Plan  Allergies  Allergen Reactions   Aspirin Other (See Comments)    Nosebleeds     Outpatient Encounter Medications as of 07/11/2021  Medication Sig   amLODipine (NORVASC) 10 MG tablet TAKE ONE TABLET BY MOUTH DAILY (Patient taking differently: Take 10 mg by mouth daily.)   apixaban (ELIQUIS) 2.5 MG TABS tablet TAKE ONE TABLET BY MOUTH TWICE A DAY   atorvastatin (LIPITOR) 80 MG tablet Take 1 tablet (80 mg total) by mouth daily.   Blood Pressure Monitoring (BLOOD PRESSURE KIT) DEVI Please check blood pressure twice a day. In the morning and evening. Please  record numbers on a sheet of paper. Blood pressure parameters are 120-150/80-90   calcitRIOL (ROCALTROL) 0.25 MCG capsule Take 0.25 mcg by mouth daily.   cefdinir (OMNICEF) 300 MG capsule Take 1 capsule (300 mg total) by mouth daily.   clobetasol cream (TEMOVATE) 0.05 % APPLY TOPICALLY TWO (TWO) TIMES DAILY. (Patient taking differently: Apply 1 application topically 2 (two) times daily.)   docusate sodium (COLACE) 100 MG capsule Take 1 capsule (100 mg total) by mouth daily.   finasteride (PROSCAR) 5 MG tablet Take 1 tablet (5 mg total) by mouth daily.   furosemide (LASIX) 40 MG tablet TAKE ONE TABLET BY MOUTH TWICE A DAY   hydrALAZINE (APRESOLINE) 10 MG tablet TAKE 1.5 TABLETS BY MOUTH THREE TIMES A DAY   nitroGLYCERIN (NITROSTAT) 0.4 MG SL tablet Place 1 tablet (0.4 mg total) under the tongue every 5 (five) minutes x 3 doses as needed for chest pain.   omeprazole (PRILOSEC) 40 MG capsule Take one capsule shortly before breakfast each morning   predniSONE (DELTASONE) 10 MG tablet Take 10 mg by mouth daily.    sodium bicarbonate 650 MG tablet TAKE TWO TABLETS BY MOUTH TWICE A DAY   sodium polystyrene (KAYEXALATE) 15 GM/60ML suspension TAKE 12O MLS (30GM) BY MOUTH AS A ONE TIME DOSE   tamsulosin (FLOMAX) 0.4 MG CAPS capsule TAKE TWO CAPSULES BY MOUTH DAILY   triamcinolone ointment (KENALOG) 0.1 % Apply 1 application topically 2 (two) times daily.   VELTASSA 8.4 g packet Take 1 packet by mouth daily.   No facility-administered encounter medications on file as of 07/11/2021.    Patient Active Problem List  Diagnosis Date Noted   Burning with urination 04/29/2021   Goals of care, counseling/discussion 11/30/2020   Rhinorrhea 11/29/2020   Dysphagia 08/23/2020   Psoriasis 04/23/2020   Acute on chronic renal failure ([place]) 03/03/2020   Acute kidney injury superimposed on chronic kidney disease (HCC)    Normal anion gap metabolic acidosis    Epistaxis 02/20/2020   Atrial fibrillation ([place])  10/14/2018   Type 2 diabetes mellitus (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   Priority: High  Onset Date: 11/19/2020     Goal: CCM RN- Self-Management Plan Developed   Start Date: 11/19/2020  Expected End Date: 09/13/2021  Recent Progress: On track  Priority: High  Note:   Current Barriers:  Care Coordination needs related to alternate housing and/or community assistance in a patient with NIDDM, HTN, HLD, CKD, A fib, s/p CVA with left sided spasticity - Spoke with patient's Niece, at her request for call,  Derrick Hendrix who wanted advice on her Uncle's continued issues with constipation.  Per Derrick Hendrix, the Dulcolax that got after his last visit with the MD does not seem to be working anymore and her uncle does not eat any fiber on a regular basis.  Questioned whether he has tried a fiber supplement like Metamucil and he has not.  Derrick Hendrix will pick some up for him and see if it helps with regularity. Discussed the  fact that the fiber may need a glass of water with it and that should be considered if he needs to watch fluid intake. Dr. Nathanial Rancher discussed Palliative Care with patient and niece on 11/30/20.  Patient consented to referral for services.  Unable to perform ADLs independently Unable to perform IADLs independently  Nurse Case Manager Clinical Goal(s):  Over the next 30-60 days, patient will work with CCM clinical social worker to investigate PACE and assisted living facilities  Interventions:  Inter-disciplinary care team collaboration (see longitudinal plan of care) Reviewed patient's clinical status related to ESRD and not a candidate for dialysis and services provided by Hospice Informed patient his problems with urination were not related to the food he ate but rather his end stage kidney disease Reinforced with patient that his health care team and niece and now Hospice want to honor his end of life wishes and maximize his quality of life via shared decision making with goal of keeping him comfortable in his home until he dies Per her request via message left earlier today, called patient's niece [name] and provided update on conversation with patient and texted her  the names of patient's primary  care provider and provider that placed Hospice referral  Will refer to CCM BSW to assist with removal of feral cats Reinforced the importance of patient notifying his niece or health care team at the time he wishes to stop aggressive care and focus on comfort care only   Patient Goals/Self-Care Activities Over the next 30-60 days, patient will:  - work with CCM team to direct shared decision making  Follow Up Plan: The care management team will reach out to the patient again over the next 30-60 days.         Plan: Telephone follow up appointment with care management team member scheduled for:  9 days  Johnney Killian, RN, BSN, CCM Care Management Coordinator [facility] Internal  Medicine Phone: [phone] / Fax: [phone]

## 2021-07-11 NOTE — Telephone Encounter (Signed)
  Care Management   Follow Up Note   07/11/2021 Name: Derrick Hendrix. MRN: [id number] DOB: 1941/11/28   Referred by: [name], MD Reason for referral : No chief complaint on file.   An unsuccessful telephone outreach was attempted today. The patient was referred to the case management team for assistance with care management and care coordination.   Follow Up Plan: The patient has been provided with contact information for the care management team and has been advised to call with any health related questions or concerns.   Milus Height, [place]  Social Worker [facility] Care Management  [phone]

## 2021-07-12 ENCOUNTER — Encounter (HOSPITAL_COMMUNITY)
Admission: RE | Admit: 2021-07-12 | Discharge: 2021-07-12 | Disposition: A | Discharge status: Home / Self Care | Payer: Medicare HMO | Source: Ambulatory Visit | Attending: Nephrology | Admitting: Nephrology

## 2021-07-12 ENCOUNTER — Other Ambulatory Visit: Payer: Self-pay

## 2021-07-12 VITALS — BP 176/82 | HR 83 | Temp 97.9°F | Resp 20

## 2021-07-12 DIAGNOSIS — N184 Chronic kidney disease, stage 4 (severe): Secondary | ICD-10-CM

## 2021-07-12 LAB — POCT HEMOGLOBIN-HEMACUE: Hemoglobin: 11.4 g/dL — ABNORMAL LOW (ref 13.0–17.0)

## 2021-07-12 MED ORDER — EPOETIN ALFA 10000 UNIT/ML IJ SOLN
10000.0000 [IU] | INTRAMUSCULAR | Status: DC
  Administered 2021-07-12: 10000 [IU] via SUBCUTANEOUS

## 2021-07-12 MED ORDER — EPOETIN ALFA 10000 UNIT/ML IJ SOLN
INTRAMUSCULAR | Status: AC
  Filled 2021-07-12: qty 1

## 2021-07-16 ENCOUNTER — Telehealth: Payer: Medicare HMO

## 2021-07-19 ENCOUNTER — Other Ambulatory Visit: Payer: Self-pay

## 2021-07-19 ENCOUNTER — Encounter (HOSPITAL_COMMUNITY)
Admission: RE | Admit: 2021-07-19 | Discharge: 2021-07-19 | Disposition: A | Discharge status: Home / Self Care | Payer: Medicare HMO | Source: Ambulatory Visit | Attending: Nephrology | Admitting: Nephrology

## 2021-07-19 VITALS — BP 177/79 | HR 88 | Temp 97.8°F | Resp 20

## 2021-07-19 DIAGNOSIS — N184 Chronic kidney disease, stage 4 (severe): Secondary | ICD-10-CM | POA: Insufficient documentation

## 2021-07-19 LAB — POCT HEMOGLOBIN-HEMACUE: Hemoglobin: 8.6 g/dL — ABNORMAL LOW (ref 13.0–17.0)

## 2021-07-19 MED ORDER — EPOETIN ALFA 10000 UNIT/ML IJ SOLN
INTRAMUSCULAR | Status: AC
  Filled 2021-07-19: qty 1

## 2021-07-19 MED ORDER — EPOETIN ALFA 10000 UNIT/ML IJ SOLN
10000.0000 [IU] | INTRAMUSCULAR | Status: DC
  Administered 2021-07-19: 10000 [IU] via SUBCUTANEOUS

## 2021-07-26 ENCOUNTER — Other Ambulatory Visit: Payer: Self-pay

## 2021-07-26 ENCOUNTER — Encounter (HOSPITAL_COMMUNITY)
Admission: RE | Admit: 2021-07-26 | Discharge: 2021-07-26 | Disposition: A | Discharge status: Home / Self Care | Payer: Medicare HMO | Source: Ambulatory Visit | Attending: Nephrology | Admitting: Nephrology

## 2021-07-26 VITALS — BP 155/92 | HR 74 | Temp 97.7°F | Resp 20

## 2021-07-26 DIAGNOSIS — N184 Chronic kidney disease, stage 4 (severe): Secondary | ICD-10-CM | POA: Diagnosis not present

## 2021-07-26 LAB — FERRITIN: Ferritin: 114 ng/mL (ref 24–336)

## 2021-07-26 LAB — IRON AND TIBC
Iron: 55 ug/dL (ref 45–182)
Saturation Ratios: 17 % — ABNORMAL LOW (ref 17.9–39.5)
TIBC: 323 ug/dL (ref 250–450)
UIBC: 268 ug/dL

## 2021-07-26 LAB — POCT HEMOGLOBIN-HEMACUE: Hemoglobin: 12.5 g/dL — ABNORMAL LOW (ref 13.0–17.0)

## 2021-07-26 MED ORDER — EPOETIN ALFA 10000 UNIT/ML IJ SOLN: 10000.0000 [IU] | INTRAMUSCULAR | Status: DC

## 2021-07-31 DIAGNOSIS — M792 Neuralgia and neuritis, unspecified: Secondary | ICD-10-CM | POA: Diagnosis not present

## 2021-07-31 DIAGNOSIS — B351 Tinea unguium: Secondary | ICD-10-CM | POA: Diagnosis not present

## 2021-07-31 DIAGNOSIS — M21621 Bunionette of right foot: Secondary | ICD-10-CM | POA: Diagnosis not present

## 2021-07-31 DIAGNOSIS — M21622 Bunionette of left foot: Secondary | ICD-10-CM | POA: Diagnosis not present

## 2021-07-31 DIAGNOSIS — I739 Peripheral vascular disease, unspecified: Secondary | ICD-10-CM | POA: Diagnosis not present

## 2021-08-01 ENCOUNTER — Other Ambulatory Visit: Payer: Self-pay | Admitting: Internal Medicine

## 2021-08-01 DIAGNOSIS — I10 Essential (primary) hypertension: Secondary | ICD-10-CM

## 2021-08-02 ENCOUNTER — Encounter (HOSPITAL_COMMUNITY): Payer: Medicare HMO

## 2021-08-08 ENCOUNTER — Ambulatory Visit: Payer: Medicare HMO

## 2021-08-08 NOTE — Chronic Care Management (AMB) (Signed)
Care Management    RN Visit Note  08/08/2021 Name: Derrick Hendrix. MRN: [id number] DOB: 1941-06-12  Subjective: Derrick Hendrix. is a 80 y.o. year old male who is a primary care patient of [name], MD. The care management team was consulted for assistance with disease management and care coordination needs.    Engaged with patient by telephone for follow up visit in response to provider referral for case management and/or care coordination services.   Consent to Services:   [name] was given information about Care Management services today including:  Care Management services includes personalized support from designated clinical staff supervised by his physician, including individualized plan of care and coordination with other care providers 24/7 contact phone numbers for assistance for urgent and routine care needs. The patient may stop case management services at any time by phone call to the office staff.  Patient agreed to services and consent obtained.   Assessment: Review of patient past medical history, allergies, medications, health status, including review of consultants reports, laboratory and other test data, was performed as part of comprehensive evaluation and provision of chronic care management services.   SDOH (Social Determinants of Health) assessments and interventions performed:    Care Plan  Allergies  Allergen Reactions   Aspirin Other (See Comments)    Nosebleeds     Outpatient Encounter Medications as of 08/08/2021  Medication Sig   amLODipine (NORVASC) 10 MG tablet TAKE ONE TABLET BY MOUTH DAILY (Patient taking differently: Take 10 mg by mouth daily.)   apixaban (ELIQUIS) 2.5 MG TABS tablet TAKE ONE TABLET BY MOUTH TWICE A DAY   atorvastatin (LIPITOR) 80 MG tablet Take 1 tablet (80 mg total) by mouth daily.   Blood Pressure Monitoring (BLOOD PRESSURE KIT) DEVI Please check blood pressure twice a day. In the morning and evening. Please  record numbers on a sheet of paper. Blood pressure parameters are 120-150/80-90   calcitRIOL (ROCALTROL) 0.25 MCG capsule Take 0.25 mcg by mouth daily.   cefdinir (OMNICEF) 300 MG capsule Take 1 capsule (300 mg total) by mouth daily.   clobetasol cream (TEMOVATE) 0.05 % APPLY TOPICALLY TWO (TWO) TIMES DAILY. (Patient taking differently: Apply 1 application topically 2 (two) times daily.)   docusate sodium (COLACE) 100 MG capsule Take 1 capsule (100 mg total) by mouth daily.   finasteride (PROSCAR) 5 MG tablet Take 1 tablet (5 mg total) by mouth daily.   furosemide (LASIX) 40 MG tablet TAKE ONE TABLET BY MOUTH TWICE A DAY   hydrALAZINE (APRESOLINE) 10 MG tablet TAKE 1.5 TABLETS BY MOUTH THREE TIMES A DAY   nitroGLYCERIN (NITROSTAT) 0.4 MG SL tablet Place 1 tablet (0.4 mg total) under the tongue every 5 (five) minutes x 3 doses as needed for chest pain.   omeprazole (PRILOSEC) 40 MG capsule Take one capsule shortly before breakfast each morning   predniSONE (DELTASONE) 10 MG tablet Take 10 mg by mouth daily.    sodium bicarbonate 650 MG tablet TAKE TWO TABLETS BY MOUTH TWICE A DAY   sodium polystyrene (KAYEXALATE) 15 GM/60ML suspension TAKE 12O MLS (30GM) BY MOUTH AS A ONE TIME DOSE   tamsulosin (FLOMAX) 0.4 MG CAPS capsule TAKE TWO CAPSULES BY MOUTH DAILY   triamcinolone ointment (KENALOG) 0.1 % Apply 1 application topically 2 (two) times daily.   VELTASSA 8.4 g packet Take 1 packet by mouth daily.   No facility-administered encounter medications on file as of 08/08/2021.    Patient Active Problem List  Diagnosis Date Noted   Burning with urination 04/29/2021   Goals of care, counseling/discussion 11/30/2020   Rhinorrhea 11/29/2020   Dysphagia 08/23/2020   Psoriasis 04/23/2020   Acute on chronic renal failure ([place]) 03/03/2020   Acute kidney injury superimposed on chronic kidney disease (HCC)    Normal anion gap metabolic acidosis    Epistaxis 02/20/2020   Atrial fibrillation ([place])  10/14/2018   Type 2 diabetes mellitus ([place]) 09/04/2017   Hemiballism 08/31/2017   Ischemic pain of foot, right    Severe peripheral arterial disease ([place]) 02/27/2017   Cerebellar infarct ([place]) 05/06/2016   Benign prostatic hyperplasia with urinary obstruction    Orthostatic hypotension 04/04/2016   Urinary retention 03/20/2016   Cerebrovascular accident (CVA) due to occlusion of left posterior cerebral artery ([place])    Dizziness 03/07/2016   Constipation 11/07/2014   Essential hypertension    Normocytic anemia 10/20/2014   Hyporeninemic hypoaldosteronism ([place]) 08/24/2014   Hyperkalemia 08/01/2014   Healthcare maintenance 07/13/2014   Atherosclerosis of leg with intermittent claudication, R 06/29/2014   Current smoker 06/27/2014   Alcohol use ([place]) 06/27/2014   CKD (chronic kidney disease), stage IV ([place]) 06/26/2014    Conditions to be addressed/monitored: HTN, HLD, DMII, and CKD Stage 3  Care Plan : CCM RN- Level of care concerns  Updates made by Johnney Killian, RN since 08/08/2021 12:00 AM     Problem: Patient needs assistance with ADLS and IADLS.   Priority: High  Onset Date: 11/19/2020     Goal: CCM RN- Self-Management Plan Developed   Start Date: 11/19/2020  Expected End Date: 09/13/2021  Recent Progress: On track  Priority: High  Note:   Current Barriers: Successful outreach to patient's niece, [name], this morning.  Per [name], patient is doing well overall.  He is getting his weekly "iron shot" and he seems to be sleeping more than he has in the past.  Patient is c/o burning in left foot and he went to see podiatrist, who said that he would need to follow up with vascular physician and he is refusing to go back to vascular doctor.  Care Coordination needs related to alternate housing and/or community assistance in a patient with NIDDM, HTN, HLD, CKD, A fib, s/p CVA with left sided spasticity -  Dr. Nathanial Rancher discussed [place] with patient and niece on 11/30/20.   Patient consented to referral for services.  Unable to perform ADLs independently Unable to perform IADLs independently  Nurse Case Manager Clinical Goal(s):  Over the next 30-60 days, patient will work with CCM clinical social worker to investigate PACE and assisted living facilities  Interventions:  Inter-disciplinary care team collaboration (see longitudinal plan of care) Reviewed patient's clinical status related to ESRD and not a candidate for dialysis and services provided by Hospice- Patient continues to refuse Hospice services and does not want to allow anyone in his home. Informed patient his problems with urination were not related to the food he ate but rather his end stage kidney disease Reinforced with patient that his health care team and niece want to honor his end of life wishes and maximize his quality of life via shared decision making with goal of keeping him comfortable in his home until he dies Will refer to CCM BSW to assist with removal of feral cats Reinforced the importance of patient notifying his niece or health care team at the time he wishes to stop aggressive care and focus on comfort care only   Patient Goals/Self-Care Activities  Over the next 30-60 days, patient will:  - work with CCM team to direct shared decision making  Follow Up Plan: The care management team will reach out to the patient again over the next 30-60 days.         Plan: Telephone follow up appointment with care management team member scheduled for:  35 days  Johnney Killian, RN, BSN, CCM Care Management Coordinator [facility] Internal Medicine Phone: [phone] / Fax: [phone]

## 2021-08-08 NOTE — Patient Instructions (Signed)
Visit Information  The patient verbalized understanding of instructions, educational materials, and care plan provided today and declined offer to receive copy of patient instructions, educational materials, and care plan.   Telephone follow up appointment with care management team member scheduled for: 09/05/21@0930   Johnney Killian, RN, BSN, CCM Care Management Coordinator [facility] Internal Medicine Phone: [phone] / Fax: [phone]

## 2021-08-09 ENCOUNTER — Encounter (HOSPITAL_COMMUNITY)
Admission: RE | Admit: 2021-08-09 | Discharge: 2021-08-09 | Disposition: A | Discharge status: Home / Self Care | Payer: Medicare HMO | Source: Ambulatory Visit | Attending: Nephrology | Admitting: Nephrology

## 2021-08-09 ENCOUNTER — Other Ambulatory Visit: Payer: Self-pay

## 2021-08-09 VITALS — BP 179/107 | HR 90 | Temp 98.2°F | Resp 20

## 2021-08-09 DIAGNOSIS — N184 Chronic kidney disease, stage 4 (severe): Secondary | ICD-10-CM | POA: Diagnosis not present

## 2021-08-09 LAB — POCT HEMOGLOBIN-HEMACUE: Hemoglobin: 12.6 g/dL — ABNORMAL LOW (ref 13.0–17.0)

## 2021-08-09 MED ORDER — EPOETIN ALFA 10000 UNIT/ML IJ SOLN: 10000.0000 [IU] | INTRAMUSCULAR | Status: DC

## 2021-08-13 ENCOUNTER — Ambulatory Visit: Payer: Medicare HMO | Admitting: Licensed Clinical Social Worker

## 2021-08-13 NOTE — Chronic Care Management (AMB) (Signed)
  Care Management   Social Work Visit Note  08/13/2021 Name: Derrick Hendrix. MRN: [id number] DOB: 22-May-1941  Derrick Hendrix. is a 80 y.o. year old male who sees [name], MD for primary care. The care management team was consulted for assistance with care management and care coordination needs related to [facility] Resources    Patient was given the following information about care management and care coordination services today, agreed to services, and gave verbal consent: 1.care management/care coordination services include personalized support from designated clinical staff supervised by their physician, including individualized plan of care and coordination with other care providers 2. 24/7 contact phone numbers for assistance for urgent and routine care needs. 3. The patient may stop care management/care coordination services at any time by phone call to the office staff.  Engaged with patient by telephone for follow up visit in response to provider referral for social work chronic care management and care coordination services.  Assessment: Review of patient history, allergies, and health status during evaluation of patient need for care management/care coordination services.    Interventions:  Patient interviewed and appropriate assessments performed Collaborated with clinical team regarding patient needs  SW spoke with Niece- Derrick Hendrix. Ms. Sthilaire stated the patient is doing well, but in the previous weeks experienced constipation. Patients constipation has resolved with use of Prune Juice. Ms. Defenbaugh requested a call from [facility]. Patient is in need of assistance to home his feral cats. Sw is still working on a solution as Teacher, English as a foreign language are full.  Ms. Strollo is concerned regarding patient being at his current home. Patient has denied hospice care and denied leaving his current home.  SW contacted animal shelters on today and left a VM.        Plan:  patient will work  with BSW to address needs related to Level of care concerns Social Worker will follow up within 30-days.   Milus Height, [place]  Social Worker [facility] Care Management  [phone]

## 2021-08-13 NOTE — Patient Instructions (Signed)
Visit Information  Instructions: patient will work with SW to address concerns related to fostering cats.  Patient was given the following information about care management and care coordination services today, agreed to services, and gave verbal consent: 1.care management/care coordination services include personalized support from designated clinical staff supervised by their physician, including individualized plan of care and coordination with other care providers 2. 24/7 contact phone numbers for assistance for urgent and routine care needs. 3. The patient may stop care management/care coordination services at any time by phone call to the office staff.  Patient verbalizes understanding of instructions provided today and agrees to view in [place].   The care management team will reach out to the patient again over the next 30 days.   Milus Height, [place]  Social Worker [facility] Care Management  [phone]

## 2021-08-16 ENCOUNTER — Encounter (HOSPITAL_COMMUNITY): Payer: Medicare HMO

## 2021-08-23 ENCOUNTER — Encounter (HOSPITAL_COMMUNITY)
Admission: RE | Admit: 2021-08-23 | Discharge: 2021-08-23 | Disposition: A | Discharge status: Home / Self Care | Payer: Medicare HMO | Source: Ambulatory Visit | Attending: Nephrology | Admitting: Nephrology

## 2021-08-23 ENCOUNTER — Other Ambulatory Visit: Payer: Self-pay

## 2021-08-23 VITALS — BP 176/94 | HR 79 | Temp 97.7°F | Resp 18

## 2021-08-23 DIAGNOSIS — N184 Chronic kidney disease, stage 4 (severe): Secondary | ICD-10-CM | POA: Insufficient documentation

## 2021-08-23 LAB — IRON AND TIBC
Iron: 57 ug/dL (ref 45–182)
Saturation Ratios: 19 % (ref 17.9–39.5)
TIBC: 301 ug/dL (ref 250–450)
UIBC: 244 ug/dL

## 2021-08-23 LAB — FERRITIN: Ferritin: 250 ng/mL (ref 24–336)

## 2021-08-23 LAB — POCT HEMOGLOBIN-HEMACUE: Hemoglobin: 11.8 g/dL — ABNORMAL LOW (ref 13.0–17.0)

## 2021-08-23 MED ORDER — EPOETIN ALFA 10000 UNIT/ML IJ SOLN
INTRAMUSCULAR | Status: AC
  Administered 2021-08-23: 10000 [IU] via SUBCUTANEOUS
  Filled 2021-08-23: qty 1

## 2021-08-23 MED ORDER — EPOETIN ALFA 10000 UNIT/ML IJ SOLN: 10000.0000 [IU] | INTRAMUSCULAR | Status: DC

## 2021-08-30 ENCOUNTER — Encounter (HOSPITAL_COMMUNITY)
Admission: RE | Admit: 2021-08-30 | Discharge: 2021-08-30 | Disposition: A | Discharge status: Home / Self Care | Payer: Medicare HMO | Source: Ambulatory Visit | Attending: Nephrology | Admitting: Nephrology

## 2021-08-30 VITALS — BP 159/83 | HR 96 | Temp 98.4°F | Resp 18

## 2021-08-30 DIAGNOSIS — N184 Chronic kidney disease, stage 4 (severe): Secondary | ICD-10-CM

## 2021-08-30 LAB — POCT HEMOGLOBIN-HEMACUE: Hemoglobin: 13.8 g/dL (ref 13.0–17.0)

## 2021-08-30 MED ORDER — EPOETIN ALFA 10000 UNIT/ML IJ SOLN
10000.0000 [IU] | INTRAMUSCULAR | Status: DC
  Administered 2021-08-30: 10000 [IU] via SUBCUTANEOUS

## 2021-09-02 ENCOUNTER — Other Ambulatory Visit: Payer: Self-pay | Admitting: Internal Medicine

## 2021-09-02 DIAGNOSIS — I10 Essential (primary) hypertension: Secondary | ICD-10-CM

## 2021-09-05 ENCOUNTER — Ambulatory Visit: Payer: Medicare HMO

## 2021-09-05 NOTE — Patient Instructions (Signed)
Visit Information   Goals Addressed             This Visit's Progress    Find Help in My Community       Timeframe:  Short-Term Goal Priority:  High Start Date:         05/06/21                    Expected End Date:  09/13/21                     Follow Up Date 10/13/21   - follow-up on any referrals for help I am given    Why is this important?   Knowing how and where to find help for yourself or family in your neighborhood and community is an important skill.  You will want to take some steps to learn how.    Notes: patient refused Hospice services and is not allowing any services into her home        The patient verbalized understanding of instructions, educational materials, and care plan provided today and declined offer to receive copy of patient instructions, educational materials, and care plan.   Telephone follow up appointment with care management team member scheduled for:10/03/21 after IMP appointment  Johnney Killian, RN, BSN, CCM Care Management Coordinator [facility] Internal Medicine Phone: [phone] / Fax: [phone]

## 2021-09-05 NOTE — Chronic Care Management (AMB) (Signed)
Care Management    RN Visit Note  09/05/2021 Name: Derrick Hendrix. MRN: [id number] DOB: 1941-09-26  Subjective: Derrick Hendrix. is a 80 y.o. year old male who is a primary care patient of Derrick Kanner, MD. The care management team was consulted for assistance with disease management and care coordination needs.    Engaged with patient by telephone for follow up visit in response to provider referral for case management and/or care coordination services.   Consent to Services:   Derrick Hendrix was given information about Care Management services today including:  Care Management services includes personalized support from designated clinical staff supervised by his physician, including individualized plan of care and coordination with other care providers 24/7 contact phone numbers for assistance for urgent and routine care needs. The patient may stop case management services at any time by phone call to the office staff.  Patient agreed to services and consent obtained.   Assessment: Review of patient past medical history, allergies, medications, health status, including review of consultants reports, laboratory and other test data, was performed as part of comprehensive evaluation and provision of chronic care management services.   SDOH (Social Determinants of Health) assessments and interventions performed:    Care Plan  Allergies  Allergen Reactions   Aspirin Other (See Comments)    Nosebleeds     Outpatient Encounter Medications as of 09/05/2021  Medication Sig   amLODipine (NORVASC) 10 MG tablet TAKE ONE TABLET BY MOUTH DAILY (Patient taking differently: Take 10 mg by mouth daily.)   apixaban (ELIQUIS) 2.5 MG TABS tablet TAKE ONE TABLET BY MOUTH TWICE A DAY   atorvastatin (LIPITOR) 80 MG tablet Take 1 tablet (80 mg total) by mouth daily.   Blood Pressure Monitoring (BLOOD PRESSURE KIT) DEVI Please check blood pressure twice a day. In the morning and evening. Please  record numbers on a sheet of paper. Blood pressure parameters are 120-150/80-90   calcitRIOL (ROCALTROL) 0.25 MCG capsule Take 0.25 mcg by mouth daily.   cefdinir (OMNICEF) 300 MG capsule Take 1 capsule (300 mg total) by mouth daily.   clobetasol cream (TEMOVATE) 0.05 % APPLY TOPICALLY TWO (TWO) TIMES DAILY. (Patient taking differently: Apply 1 application topically 2 (two) times daily.)   docusate sodium (COLACE) 100 MG capsule Take 1 capsule (100 mg total) by mouth daily.   finasteride (PROSCAR) 5 MG tablet Take 1 tablet (5 mg total) by mouth daily.   furosemide (LASIX) 40 MG tablet TAKE ONE TABLET BY MOUTH TWICE A DAY   hydrALAZINE (APRESOLINE) 10 MG tablet TAKE 1.5 TABLETS BY MOUTH THREE TIMES A DAY   nitroGLYCERIN (NITROSTAT) 0.4 MG SL tablet Place 1 tablet (0.4 mg total) under the tongue every 5 (five) minutes x 3 doses as needed for chest pain.   omeprazole (PRILOSEC) 40 MG capsule Take one capsule shortly before breakfast each morning   predniSONE (DELTASONE) 10 MG tablet Take 10 mg by mouth daily.    sodium bicarbonate 650 MG tablet TAKE TWO TABLETS BY MOUTH TWICE A DAY   sodium polystyrene (KAYEXALATE) 15 GM/60ML suspension TAKE 12O MLS (30GM) BY MOUTH AS A ONE TIME DOSE   tamsulosin (FLOMAX) 0.4 MG CAPS capsule TAKE TWO CAPSULES BY MOUTH DAILY   triamcinolone ointment (KENALOG) 0.1 % Apply 1 application topically 2 (two) times daily.   VELTASSA 8.4 g packet Take 1 packet by mouth daily.   No facility-administered encounter medications on file as of 09/05/2021.    Patient Active Problem List  Diagnosis Date Noted   Burning with urination 04/29/2021   Goals of care, counseling/discussion 11/30/2020   Rhinorrhea 11/29/2020   Dysphagia 08/23/2020   Psoriasis 04/23/2020   Acute on chronic renal failure ([place]) 03/03/2020   Acute kidney injury superimposed on chronic kidney disease (HCC)    Normal anion gap metabolic acidosis    Epistaxis 02/20/2020   Atrial fibrillation ([place])  10/14/2018   Type 2 diabetes mellitus ([place]) 09/04/2017   Hemiballism 08/31/2017   Ischemic pain of foot, right    Severe peripheral arterial disease ([place]) 02/27/2017   Cerebellar infarct ([place]) 05/06/2016   Benign prostatic hyperplasia with urinary obstruction    Orthostatic hypotension 04/04/2016   Urinary retention 03/20/2016   Cerebrovascular accident (CVA) due to occlusion of left posterior cerebral artery (HCC)    Dizziness 03/07/2016   Constipation 11/07/2014   Essential hypertension    Normocytic anemia 10/20/2014   Hyporeninemic hypoaldosteronism ([place]) 08/24/2014   Hyperkalemia 08/01/2014   Healthcare maintenance 07/13/2014   Atherosclerosis of leg with intermittent claudication, R 06/29/2014   Current smoker 06/27/2014   Alcohol use ([place]) 06/27/2014   CKD (chronic kidney disease), stage IV ([place]) 06/26/2014    Conditions to be addressed/monitored: HTN and CKD Stage 4  Care Plan : CCM RN- Level of care concerns  Updates made by Derrick Killian, RN since 09/05/2021 12:00 AM     Problem: Patient needs assistance with ADLS and IADLS.   Priority: High  Onset Date: 11/19/2020     Goal: CCM RN- Self-Management Plan Developed   Start Date: 11/19/2020  Expected End Date: 09/13/2021  Recent Progress: On track  Priority: High  Note:   Current Barriers: Successful outreach to patient's niece, Derrick Hendrix, this morning.  She notes that her Derrick Hendrix has complained of being excessively tired. In review of the chart patient's Hgb on 08/30/21 was 13.8.  Derrick Hendrix requested patient be seen In clinic to evaluate his fatigue.  Message sent to front desk staff to call her and schedule appointment. Care Coordination needs related to alternate housing and/or community assistance in a patient with NIDDM, HTN, HLD, CKD, A fib, s/p CVA with left sided spasticity -  Derrick Hendrix discussed Derrick Hendrix with patient and niece on 11/30/20.  Patient consented to referral for services.  Unable to perform ADLs  independently Unable to perform IADLs independently  Nurse Case Manager Clinical Goal(s):  Over the next 30-60 days, patient will work with CCM clinical social worker to investigate PACE and assisted living facilities  Interventions:  Inter-disciplinary care team collaboration (see longitudinal plan of care) Reviewed patient's clinical status related to ESRD and not a candidate for dialysis and services provided by Hospice- Patient continues to refuse Hospice services and does not want to allow anyone in his home. Informed patient his problems with urination were not related to the food he ate but rather his end stage kidney disease Reinforced with patient that his health care team and niece want to honor his end of life wishes and maximize his quality of life via shared decision making with goal of keeping him comfortable in his home until he dies Will refer to CCM BSW to assist with removal of feral cats Reinforced the importance of patient notifying his niece or health care team at the time he wishes to stop aggressive care and focus on comfort care only   Patient Goals/Self-Care Activities Over the next 30-60 days, patient will:  - work with CCM team to direct shared decision making  Follow  Up Plan: The care management team will reach out to the patient again over the next 30-60 days.         Plan: Telephone follow up appointment with care management team member scheduled for:  After appointment in clinic.  Derrick Killian, RN, BSN, CCM Care Management Coordinator [facility] Medicine Phone: [phone] / Fax: [phone]

## 2021-09-06 ENCOUNTER — Encounter (HOSPITAL_COMMUNITY)
Admission: RE | Admit: 2021-09-06 | Discharge: 2021-09-06 | Disposition: A | Discharge status: Home / Self Care | Payer: Medicare HMO | Source: Ambulatory Visit | Attending: Nephrology | Admitting: Nephrology

## 2021-09-06 ENCOUNTER — Other Ambulatory Visit: Payer: Self-pay

## 2021-09-06 ENCOUNTER — Other Ambulatory Visit (HOSPITAL_COMMUNITY): Payer: Self-pay | Admitting: *Deleted

## 2021-09-06 DIAGNOSIS — N184 Chronic kidney disease, stage 4 (severe): Secondary | ICD-10-CM

## 2021-09-06 MED ORDER — EPOETIN ALFA 10000 UNIT/ML IJ SOLN: 10000.0000 [IU] | INTRAMUSCULAR | Status: DC

## 2021-09-09 LAB — POCT HEMOGLOBIN-HEMACUE: Hemoglobin: 13.1 g/dL (ref 13.0–17.0)

## 2021-09-13 ENCOUNTER — Encounter (HOSPITAL_COMMUNITY): Payer: Medicare HMO

## 2021-09-13 ENCOUNTER — Telehealth: Payer: Medicare HMO | Admitting: Licensed Clinical Social Worker

## 2021-09-16 ENCOUNTER — Encounter: Payer: Self-pay | Admitting: Student

## 2021-09-20 ENCOUNTER — Encounter (HOSPITAL_COMMUNITY)
Admission: RE | Admit: 2021-09-20 | Discharge: 2021-09-20 | Disposition: A | Discharge status: Home / Self Care | Payer: Medicare HMO | Source: Ambulatory Visit | Attending: Student | Admitting: Student

## 2021-09-20 ENCOUNTER — Encounter (HOSPITAL_COMMUNITY): Payer: Medicare HMO

## 2021-09-20 VITALS — BP 141/81 | HR 88 | Temp 97.1°F | Resp 18

## 2021-09-20 DIAGNOSIS — D631 Anemia in chronic kidney disease: Secondary | ICD-10-CM | POA: Diagnosis not present

## 2021-09-20 DIAGNOSIS — N184 Chronic kidney disease, stage 4 (severe): Secondary | ICD-10-CM | POA: Insufficient documentation

## 2021-09-20 LAB — IRON AND TIBC
Iron: 103 ug/dL (ref 45–182)
Saturation Ratios: 35 % (ref 17.9–39.5)
TIBC: 298 ug/dL (ref 250–450)
UIBC: 195 ug/dL

## 2021-09-20 LAB — POCT HEMOGLOBIN-HEMACUE: Hemoglobin: 11.2 g/dL — ABNORMAL LOW (ref 13.0–17.0)

## 2021-09-20 LAB — FERRITIN: Ferritin: 278 ng/mL (ref 24–336)

## 2021-09-20 MED ORDER — EPOETIN ALFA 10000 UNIT/ML IJ SOLN
INTRAMUSCULAR | Status: AC
  Filled 2021-09-20: qty 1

## 2021-09-20 MED ORDER — EPOETIN ALFA 10000 UNIT/ML IJ SOLN
10000.0000 [IU] | INTRAMUSCULAR | Status: DC
  Administered 2021-09-20: 10000 [IU] via SUBCUTANEOUS

## 2021-09-27 ENCOUNTER — Encounter (HOSPITAL_COMMUNITY)
Admission: RE | Admit: 2021-09-27 | Discharge: 2021-09-27 | Disposition: A | Discharge status: Home / Self Care | Payer: Medicare HMO | Source: Ambulatory Visit | Attending: Nephrology | Admitting: Nephrology

## 2021-09-27 VITALS — BP 144/87 | HR 76 | Temp 97.5°F | Resp 18

## 2021-09-27 DIAGNOSIS — D631 Anemia in chronic kidney disease: Secondary | ICD-10-CM | POA: Diagnosis not present

## 2021-09-27 DIAGNOSIS — N184 Chronic kidney disease, stage 4 (severe): Secondary | ICD-10-CM

## 2021-09-27 LAB — POCT HEMOGLOBIN-HEMACUE: Hemoglobin: 12.1 g/dL — ABNORMAL LOW (ref 13.0–17.0)

## 2021-09-27 MED ORDER — EPOETIN ALFA 10000 UNIT/ML IJ SOLN: 10000.0000 [IU] | INTRAMUSCULAR | Status: DC

## 2021-09-30 ENCOUNTER — Other Ambulatory Visit: Payer: Self-pay | Admitting: Internal Medicine

## 2021-09-30 DIAGNOSIS — I10 Essential (primary) hypertension: Secondary | ICD-10-CM

## 2021-09-30 DIAGNOSIS — R339 Retention of urine, unspecified: Secondary | ICD-10-CM

## 2021-10-03 ENCOUNTER — Ambulatory Visit: Payer: Medicare HMO

## 2021-10-03 DIAGNOSIS — N184 Chronic kidney disease, stage 4 (severe): Secondary | ICD-10-CM

## 2021-10-03 DIAGNOSIS — I10 Essential (primary) hypertension: Secondary | ICD-10-CM

## 2021-10-03 DIAGNOSIS — K5900 Constipation, unspecified: Secondary | ICD-10-CM

## 2021-10-03 NOTE — Patient Instructions (Signed)
Visit Information   Goals Addressed             This Visit's Progress    Find Help in My Community       Timeframe:  Short-Term Goal Priority:  High Start Date:         05/06/21                    Expected End Date:  11/13/21                     Follow Up Date: 11/13/21   - follow-up on any referrals for help I am given    Why is this important?   Knowing how and where to find help for yourself or family in your neighborhood and community is an important skill.  You will want to take some steps to learn how.    Notes: patient refused Hospice services and is not allowing any services into her home     Manage Constipation       Timeframe:  Long-Range Goal Priority:  High Start Date:                             Expected End Date:                       Follow Up Date 11/13/21    - be active every day - call the doctor or nurse if constipation doesn't get better - drink at least 6 glasses of fluids a day - eat 3 to 5 servings of fruits and vegetables each day - increase the amount of dietary fiber in food - take a stool softener and or prune juice   Why is this important?   Eating foods that are high in iron can lead to hard, dry stool.  Taking iron pills or vitamins can also cause this to happen.  Making a few simple changes can help.    Notes:         The patient verbalized understanding of instructions, educational materials, and care plan provided today and declined offer to receive copy of patient instructions, educational materials, and care plan.   Telephone follow up appointment with care management team member scheduled for:11/14/21@0930   Johnney Killian, RN, BSN, CCM Care Management Coordinator [facility] Phone: [phone] / Fax: [phone]

## 2021-10-03 NOTE — Chronic Care Management (AMB) (Signed)
Care Management    RN Visit Note  10/03/2021 Name: Derrick Hendrix. MRN: [id number] DOB: 1941/11/02  Subjective: Derrick Hendrix. is a 80 y.o. year old male who is a primary care patient of [name], MD. The care management team was consulted for assistance with disease management and care coordination needs.    Engaged with patient by telephone for follow up visit in response to provider referral for case management and/or care coordination services.   Consent to Services:   [name] was given information about Care Management services today including:  Care Management services includes personalized support from designated clinical staff supervised by his physician, including individualized plan of care and coordination with other care providers 24/7 contact phone numbers for assistance for urgent and routine care needs. The patient may stop case management services at any time by phone call to the office staff.  Patient agreed to services and consent obtained.   Assessment: Review of patient past medical history, allergies, medications, health status, including review of consultants reports, laboratory and other test data, was performed as part of comprehensive evaluation and provision of chronic care management services.   SDOH (Social Determinants of Health) assessments and interventions performed:    Care Plan  Allergies  Allergen Reactions   Aspirin Other (See Comments)    Nosebleeds     Outpatient Encounter Medications as of 10/03/2021  Medication Sig   amLODipine (NORVASC) 10 MG tablet TAKE ONE TABLET BY MOUTH DAILY (Patient taking differently: Take 10 mg by mouth daily.)   apixaban (ELIQUIS) 2.5 MG TABS tablet TAKE ONE TABLET BY MOUTH TWICE A DAY   atorvastatin (LIPITOR) 80 MG tablet TAKE ONE TABLET BY MOUTH DAILY   Blood Pressure Monitoring (BLOOD PRESSURE KIT) DEVI Please check blood pressure twice a day. In the morning and evening. Please record numbers  on a sheet of paper. Blood pressure parameters are 120-150/80-90   calcitRIOL (ROCALTROL) 0.25 MCG capsule Take 0.25 mcg by mouth daily.   cefdinir (OMNICEF) 300 MG capsule Take 1 capsule (300 mg total) by mouth daily.   clobetasol cream (TEMOVATE) 0.05 % APPLY TOPICALLY TWO (TWO) TIMES DAILY. (Patient taking differently: Apply 1 application topically 2 (two) times daily.)   docusate sodium (COLACE) 100 MG capsule Take 1 capsule (100 mg total) by mouth daily.   finasteride (PROSCAR) 5 MG tablet TAKE ONE TABLET BY MOUTH DAILY   furosemide (LASIX) 40 MG tablet TAKE ONE TABLET BY MOUTH TWICE A DAY   hydrALAZINE (APRESOLINE) 10 MG tablet TAKE 1.5 TABLETS BY MOUTH THREE TIMES A DAY   nitroGLYCERIN (NITROSTAT) 0.4 MG SL tablet Place 1 tablet (0.4 mg total) under the tongue every 5 (five) minutes x 3 doses as needed for chest pain.   omeprazole (PRILOSEC) 40 MG capsule Take one capsule shortly before breakfast each morning   predniSONE (DELTASONE) 10 MG tablet Take 10 mg by mouth daily.    sodium bicarbonate 650 MG tablet TAKE TWO TABLETS BY MOUTH TWICE A DAY   sodium polystyrene (KAYEXALATE) 15 GM/60ML suspension TAKE 12O MLS (30GM) BY MOUTH AS A ONE TIME DOSE   tamsulosin (FLOMAX) 0.4 MG CAPS capsule TAKE TWO CAPSULES BY MOUTH DAILY   triamcinolone ointment (KENALOG) 0.1 % Apply 1 application topically 2 (two) times daily.   VELTASSA 8.4 g packet Take 1 packet by mouth daily.   No facility-administered encounter medications on file as of 10/03/2021.    Patient Active Problem List   Diagnosis Date Noted  Burning with urination 04/29/2021   Goals of care, counseling/discussion 11/30/2020   Rhinorrhea 11/29/2020   Dysphagia 08/23/2020   Psoriasis 04/23/2020   Acute on chronic renal failure ([place]) 03/03/2020   Acute kidney injury superimposed on chronic kidney disease (HCC)    Normal anion gap metabolic acidosis    Epistaxis 02/20/2020   Atrial fibrillation ([place]) 10/14/2018   Type 2 diabetes  mellitus ([place]) 09/04/2017   Hemiballism 08/31/2017   Ischemic pain of foot, right    Severe peripheral arterial disease ([place]) 02/27/2017   Cerebellar infarct ([place]) 05/06/2016   Benign prostatic hyperplasia with urinary obstruction    Orthostatic hypotension 04/04/2016   Urinary retention 03/20/2016   Cerebrovascular accident (CVA) due to occlusion of left posterior cerebral artery (HCC)    Dizziness 03/07/2016   Constipation 11/07/2014   Essential hypertension    Normocytic anemia 10/20/2014   Hyporeninemic hypoaldosteronism ([place]) 08/24/2014   Hyperkalemia 08/01/2014   Healthcare maintenance 07/13/2014   Atherosclerosis of leg with intermittent claudication, R 06/29/2014   Current smoker 06/27/2014   Alcohol use ([place]) 06/27/2014   CKD (chronic kidney disease), stage IV ([place]) 06/26/2014    Conditions to be addressed/monitored: HTN, HLD, CKD Stage 5, and Chronic constipation  Care Plan : CCM RN- Level of care concerns  Updates made by Johnney Killian, RN since 10/03/2021 12:00 AM     Problem: Patient needs assistance with ADLS and IADLS.   Priority: High  Onset Date: 11/19/2020     Goal: CCM RN- Self-Management Plan Developed   Start Date: 11/19/2020  Expected End Date: 09/13/2021  Recent Progress: On track  Priority: High  Note:   Current Barriers: Successful outreach to patient this morning.  He is receiving Epogen injections every other week and his current Hgb 12.1 which is better than it has been.  Patient complains of chronic constipation which he is taking stool softener and Prune juice.  Patient was upbeat, noting that he has been walking every day about a block and within his home.   Care Coordination needs related to alternate housing and/or community assistance in a patient with NIDDM, HTN, HLD, CKD, A fib, s/p CVA with left sided spasticity -  Dr. Nathanial Rancher discussed [place] with patient and niece on 11/30/20.  Patient consented to referral for services.   Unable to perform ADLs independently Unable to perform IADLs independently  Nurse Case Manager Clinical Goal(s):  Over the next 30-60 days, patient will work with CCM to manage his chronic constipation and anemia  Interventions:  Inter-disciplinary care team collaboration (see longitudinal plan of care) Reviewed patient's clinical status related to ESRD and not a candidate for dialysis and services provided by Hospice- Patient continues to refuse Hospice services and does not want to allow anyone in his home. Informed patient his problems with urination were not related to the food he ate but rather his end stage kidney disease Reinforced with patient that his health care team and niece want to honor his end of life wishes and maximize his quality of life via shared decision making with goal of keeping him comfortable in his home until he dies Will refer to CCM BSW to assist with removal of feral cats Reinforced the importance of patient notifying his niece or health care team at the time he wishes to stop aggressive care and focus on comfort care only  Patient Goals/Self-Care Activities Over the next 30-60 days, patient will:  - work with CCM team to direct shared decision making  Follow Up Plan: The care management team will reach out to the patient again over the next 30-60 days.         Plan: Telephone follow up appointment with care management team member scheduled for:  30-60 days  Johnney Killian, RN, BSN, CCM Care Management Coordinator [facility] Phone: [phone] / Fax: [phone]

## 2021-10-04 ENCOUNTER — Encounter (HOSPITAL_COMMUNITY): Payer: Medicare HMO

## 2021-10-11 ENCOUNTER — Encounter (HOSPITAL_COMMUNITY)
Admission: RE | Admit: 2021-10-11 | Discharge: 2021-10-11 | Disposition: A | Discharge status: Home / Self Care | Payer: Medicare HMO | Source: Ambulatory Visit | Attending: Nephrology | Admitting: Nephrology

## 2021-10-11 ENCOUNTER — Other Ambulatory Visit: Payer: Self-pay

## 2021-10-11 VITALS — BP 159/96 | HR 96 | Temp 97.0°F | Resp 18

## 2021-10-11 DIAGNOSIS — D631 Anemia in chronic kidney disease: Secondary | ICD-10-CM | POA: Diagnosis not present

## 2021-10-11 DIAGNOSIS — N184 Chronic kidney disease, stage 4 (severe): Secondary | ICD-10-CM | POA: Diagnosis not present

## 2021-10-11 LAB — POCT HEMOGLOBIN-HEMACUE: Hemoglobin: 11.8 g/dL — ABNORMAL LOW (ref 13.0–17.0)

## 2021-10-11 MED ORDER — EPOETIN ALFA 10000 UNIT/ML IJ SOLN
INTRAMUSCULAR | Status: AC
  Filled 2021-10-11: qty 1

## 2021-10-11 MED ORDER — EPOETIN ALFA 10000 UNIT/ML IJ SOLN
10000.0000 [IU] | INTRAMUSCULAR | Status: DC
  Administered 2021-10-11: 10000 [IU] via SUBCUTANEOUS

## 2021-10-18 ENCOUNTER — Encounter (HOSPITAL_COMMUNITY): Payer: Medicare HMO

## 2021-10-22 DIAGNOSIS — L603 Nail dystrophy: Secondary | ICD-10-CM | POA: Diagnosis not present

## 2021-10-22 DIAGNOSIS — B351 Tinea unguium: Secondary | ICD-10-CM | POA: Diagnosis not present

## 2021-10-22 DIAGNOSIS — I739 Peripheral vascular disease, unspecified: Secondary | ICD-10-CM | POA: Diagnosis not present

## 2021-10-22 DIAGNOSIS — M21629 Bunionette of unspecified foot: Secondary | ICD-10-CM | POA: Diagnosis not present

## 2021-10-22 DIAGNOSIS — I70203 Unspecified atherosclerosis of native arteries of extremities, bilateral legs: Secondary | ICD-10-CM | POA: Diagnosis not present

## 2021-10-22 DIAGNOSIS — L84 Corns and callosities: Secondary | ICD-10-CM | POA: Diagnosis not present

## 2021-10-25 ENCOUNTER — Encounter (HOSPITAL_COMMUNITY)
Admission: RE | Admit: 2021-10-25 | Discharge: 2021-10-25 | Disposition: A | Discharge status: Home / Self Care | Payer: Medicare HMO | Source: Ambulatory Visit | Attending: Nephrology | Admitting: Nephrology

## 2021-10-25 VITALS — BP 159/76 | HR 97 | Temp 97.4°F | Resp 18

## 2021-10-25 DIAGNOSIS — N185 Chronic kidney disease, stage 5: Secondary | ICD-10-CM | POA: Insufficient documentation

## 2021-10-25 DIAGNOSIS — N184 Chronic kidney disease, stage 4 (severe): Secondary | ICD-10-CM | POA: Insufficient documentation

## 2021-10-25 LAB — FERRITIN: Ferritin: 202 ng/mL (ref 24–336)

## 2021-10-25 LAB — IRON AND TIBC
Iron: 81 ug/dL (ref 45–182)
Saturation Ratios: 27 % (ref 17.9–39.5)
TIBC: 304 ug/dL (ref 250–450)
UIBC: 223 ug/dL

## 2021-10-25 LAB — POCT HEMOGLOBIN-HEMACUE: Hemoglobin: 11.7 g/dL — ABNORMAL LOW (ref 13.0–17.0)

## 2021-10-25 MED ORDER — EPOETIN ALFA 10000 UNIT/ML IJ SOLN
INTRAMUSCULAR | Status: AC
  Filled 2021-10-25: qty 1

## 2021-10-25 MED ORDER — EPOETIN ALFA 10000 UNIT/ML IJ SOLN
10000.0000 [IU] | INTRAMUSCULAR | Status: DC
  Administered 2021-10-25: 10000 [IU] via SUBCUTANEOUS

## 2021-10-30 ENCOUNTER — Encounter: Payer: Self-pay | Admitting: Student

## 2021-10-30 ENCOUNTER — Ambulatory Visit (INDEPENDENT_AMBULATORY_CARE_PROVIDER_SITE_OTHER): Payer: Medicare HMO | Admitting: Student

## 2021-10-30 VITALS — BP 188/136 | HR 85 | Temp 98.4°F

## 2021-10-30 DIAGNOSIS — N183 Chronic kidney disease, stage 3 unspecified: Secondary | ICD-10-CM | POA: Diagnosis not present

## 2021-10-30 DIAGNOSIS — K5900 Constipation, unspecified: Secondary | ICD-10-CM

## 2021-10-30 DIAGNOSIS — I129 Hypertensive chronic kidney disease with stage 1 through stage 4 chronic kidney disease, or unspecified chronic kidney disease: Secondary | ICD-10-CM

## 2021-10-30 DIAGNOSIS — E875 Hyperkalemia: Secondary | ICD-10-CM | POA: Diagnosis not present

## 2021-10-30 DIAGNOSIS — N185 Chronic kidney disease, stage 5: Secondary | ICD-10-CM | POA: Diagnosis not present

## 2021-10-30 DIAGNOSIS — E1122 Type 2 diabetes mellitus with diabetic chronic kidney disease: Secondary | ICD-10-CM | POA: Diagnosis not present

## 2021-10-30 DIAGNOSIS — N184 Chronic kidney disease, stage 4 (severe): Secondary | ICD-10-CM

## 2021-10-30 DIAGNOSIS — Z23 Encounter for immunization: Secondary | ICD-10-CM

## 2021-10-30 DIAGNOSIS — I10 Essential (primary) hypertension: Secondary | ICD-10-CM

## 2021-10-30 LAB — BASIC METABOLIC PANEL
Anion gap: 10 (ref 5–15)
BUN: 72 mg/dL — ABNORMAL HIGH (ref 8–23)
CO2: 22 mmol/L (ref 22–32)
Calcium: 8.9 mg/dL (ref 8.9–10.3)
Chloride: 108 mmol/L (ref 98–111)
Creatinine, Ser: 4.81 mg/dL — ABNORMAL HIGH (ref 0.61–1.24)
GFR, Estimated: 11 mL/min — ABNORMAL LOW (ref >60)
Glucose, Bld: 83 mg/dL (ref 70–99)
Potassium: 6.2 mmol/L — ABNORMAL HIGH (ref 3.5–5.1)
Sodium: 140 mmol/L (ref 135–145)

## 2021-10-30 NOTE — Progress Notes (Signed)
   CC: routine follow-up  HPI:  [name]. is a 80 y.o. with CKDV (not a candidate for dialysis), type II diabetes mellitus, previous CVA, peripheral arterial disease, atrial fibrillation presenting to [facility] for routine follow-up.   Please see problem-based list for further details.   Past Medical History:  Diagnosis Date   Abscess of foot without toes, left 08/28/2017   Chronic kidney disease (CKD), stage III (moderate) (HCC)    Constipation 11/07/2014   Daily headache    "lately" (04/17/2016)   Depression    Dizziness 03/07/2016   Headache 06/26/2014   Heart murmur    Hyperkalemia 10/05/2018   Hyperlipemia 11/11/2016   Hypertension    Neuropathic pain 02/06/2015   Osteomyelitis ([place])    PAD (peripheral artery disease) ([place])    Paronychia of great toe, right    Preop cardiovascular exam    Right foot pain 02/09/2017   Stable angina ([place]) 06/27/2014   Stroke ([place]) 03/2016   hx of PAD; j"ust lots of headaches since" (04/17/2016)   Vitamin B12 deficiency 06/27/2014   Weight loss, unintentional 06/27/2014   Review of Systems:  As per HPI  Physical Exam:  Vitals:   10/30/21 1028  BP: (!) 188/136  Pulse: 85  Temp: 98.4 F (36.9 C)  TempSrc: Oral  SpO2: 100%   General: Resting comfortably in no acute distress Pulm: Normal work of breathing on room air. Clear to ausculation bilaterally. Neuro: Awake, alert, conversing appropriately. Psych: Normal mood, affect, speech.  Assessment & Plan:   See Encounters Tab for problem based charting.  Patient discussed with Dr. Heber Lovilia

## 2021-10-30 NOTE — Assessment & Plan Note (Signed)
BP Readings from Last 3 Encounters:  10/30/21 (!) 188/136  10/25/21 (!) 159/76  10/11/21 (!) 159/96   Blood pressure elevated today, similar to previous visits. Unfortunately, patient is unable to take his blood pressure medications before coming to clinic due to increased urinary urgency while taking the bus here. We will continue with his current medications and repeat BMP.  - Continue amlodipine, hydralazine - BMP today

## 2021-10-30 NOTE — Assessment & Plan Note (Signed)
Patient has CKDV and is not a candidate for dialysis. This is a terminal disease and patient is being followed by palliative services in the outpatient setting. He has been doing well recently, no issues. We will obtain BMP today to check for electrolytes given his history of hyperkalemia.  - BMP today - Follow-up with nephrology  ADDENDUM: K 6.2 today. Discussed with patient, who reports he has not taken his Veltassa yet today. I discussed with him to take double the dose today and resume his regular dose tomorrow. Patient to follow-up within the next week to repeat blood work. - Veltassa 16.8g today, re-start 8.4g daily tomorrow - F/u repeat BMP

## 2021-10-30 NOTE — Patient Instructions (Signed)
[name], it was a pleasure seeing you today!  Today we discussed: - I am so glad everything is going well for you [name]! If you have any further issues with constipation, please let us know.   - I will call you with the results of your lab work.  - We will see you back in about four months.   Follow-up: 4 months   Please make sure to arrive 15 minutes prior to your next appointment. If you arrive late, you may be asked to reschedule.   We look forward to seeing you next time. Please call our clinic at [phone] if you have any questions or concerns. The best time to call is Monday-Friday from 9am-4pm, but there is someone available 24/7. If after hours or the weekend, call the main hospital number and ask for the Internal Medicine Resident On-Call. If you need medication refills, please notify your pharmacy one week in advance and they will send [place] a request.  Thank you for letting us take part in your care. Wishing you the best!  Thank you, [name], MD

## 2021-10-30 NOTE — Assessment & Plan Note (Signed)
Since last visit, patient reports his bowel movements have improved. He now goes roughly three times weekly without issues. He does have as needed laxative that he takes when he feels constipated. Currently he denies any nausea, vomiting, or abdominal pain.   Given patient is a terminal CKD patient with palliative services, main focus on symptomatic care. We will continue with his current regimen and add laxative as needed.   - Continue colace as needed

## 2021-11-01 ENCOUNTER — Encounter (HOSPITAL_COMMUNITY): Admission: RE | Admit: 2021-11-01 | Payer: Medicare HMO | Source: Ambulatory Visit

## 2021-11-01 ENCOUNTER — Other Ambulatory Visit: Payer: Self-pay

## 2021-11-01 NOTE — Progress Notes (Signed)
Internal Medicine Clinic Attending ? ?Case discussed with Dr. Braswell  At the time of the visit.  We reviewed the resident?s history and exam and pertinent patient test results.  I agree with the assessment, diagnosis, and plan of care documented in the resident?s note.  ?

## 2021-11-04 ENCOUNTER — Encounter (HOSPITAL_COMMUNITY)
Admission: RE | Admit: 2021-11-04 | Discharge: 2021-11-04 | Disposition: A | Discharge status: Home / Self Care | Payer: Medicare HMO | Source: Ambulatory Visit | Attending: Nephrology | Admitting: Nephrology

## 2021-11-04 VITALS — BP 191/100

## 2021-11-04 DIAGNOSIS — N185 Chronic kidney disease, stage 5: Secondary | ICD-10-CM | POA: Diagnosis not present

## 2021-11-04 DIAGNOSIS — N184 Chronic kidney disease, stage 4 (severe): Secondary | ICD-10-CM | POA: Diagnosis not present

## 2021-11-04 LAB — POCT HEMOGLOBIN-HEMACUE: Hemoglobin: 11.6 g/dL — ABNORMAL LOW (ref 13.0–17.0)

## 2021-11-04 MED ORDER — EPOETIN ALFA 10000 UNIT/ML IJ SOLN
INTRAMUSCULAR | Status: AC
  Filled 2021-11-04: qty 1

## 2021-11-04 MED ORDER — CLONIDINE HCL 0.1 MG PO TABS
ORAL_TABLET | ORAL | Status: AC
  Filled 2021-11-04: qty 1

## 2021-11-04 MED ORDER — EPOETIN ALFA 10000 UNIT/ML IJ SOLN
10000.0000 [IU] | INTRAMUSCULAR | Status: DC
  Administered 2021-11-04: 10000 [IU] via SUBCUTANEOUS

## 2021-11-04 MED ORDER — CLONIDINE HCL 0.1 MG PO TABS
0.1000 mg | ORAL_TABLET | Freq: Once | ORAL | Status: AC | PRN
  Administered 2021-11-04: 0.1 mg via ORAL

## 2021-11-04 NOTE — Progress Notes (Signed)
Pt in [facility] for procrit injection. After multiple attempts his BP still remains elevated at 191/100. He did not take his home meds before coming here. Clonidine given as ordered and will reassess in 15-30 min.

## 2021-11-08 ENCOUNTER — Encounter (HOSPITAL_COMMUNITY): Payer: Medicare HMO

## 2021-11-14 ENCOUNTER — Telehealth: Payer: Medicare HMO

## 2021-11-15 ENCOUNTER — Encounter (HOSPITAL_COMMUNITY)
Admission: RE | Admit: 2021-11-15 | Discharge: 2021-11-15 | Disposition: A | Discharge status: Home / Self Care | Payer: Medicare HMO | Source: Ambulatory Visit | Attending: Nephrology | Admitting: Nephrology

## 2021-11-15 ENCOUNTER — Other Ambulatory Visit: Payer: Self-pay

## 2021-11-15 VITALS — BP 175/98 | HR 83 | Temp 97.2°F | Resp 18

## 2021-11-15 DIAGNOSIS — N185 Chronic kidney disease, stage 5: Secondary | ICD-10-CM | POA: Insufficient documentation

## 2021-11-15 LAB — POCT HEMOGLOBIN-HEMACUE: Hemoglobin: 12.8 g/dL — ABNORMAL LOW (ref 13.0–17.0)

## 2021-11-15 MED ORDER — EPOETIN ALFA 10000 UNIT/ML IJ SOLN: 10000.0000 [IU] | INTRAMUSCULAR | Status: DC

## 2021-11-22 ENCOUNTER — Ambulatory Visit: Payer: Medicare HMO

## 2021-11-22 ENCOUNTER — Encounter (HOSPITAL_COMMUNITY): Payer: Medicare HMO

## 2021-11-22 NOTE — Chronic Care Management (AMB) (Signed)
Care Management    RN Visit Note  11/22/2021 Name: Derrick Hendrix. MRN: [id number] DOB: 26-Oct-1941  Subjective: Derrick Hendrix. is a 80 y.o. year old male who is a primary care patient of Derrick Kanner, MD. The care management team was consulted for assistance with disease management and care coordination needs.    Engaged with patient by telephone for follow up visit in response to provider referral for case management and/or care coordination services.   Consent to Services:   Derrick Hendrix was given information about Care Management services today including:  Care Management services includes personalized support from designated clinical staff supervised by his physician, including individualized plan of care and coordination with other care providers 24/7 contact phone numbers for assistance for urgent and routine care needs. The patient may stop case management services at any time by phone call to the office staff.  Patient agreed to services and consent obtained.   Assessment: Review of patient past medical history, allergies, medications, health status, including review of consultants reports, laboratory and other test data, was performed as part of comprehensive evaluation and provision of chronic care management services.   SDOH (Social Determinants of Health) assessments and interventions performed:    Care Plan  Allergies  Allergen Reactions   Aspirin Other (See Comments)    Nosebleeds     Outpatient Encounter Medications as of 11/22/2021  Medication Sig   amLODipine (NORVASC) 10 MG tablet TAKE ONE TABLET BY MOUTH DAILY (Patient taking differently: Take 10 mg by mouth daily.)   apixaban (ELIQUIS) 2.5 MG TABS tablet TAKE ONE TABLET BY MOUTH TWICE A DAY   atorvastatin (LIPITOR) 80 MG tablet TAKE ONE TABLET BY MOUTH DAILY   Blood Pressure Monitoring (BLOOD PRESSURE KIT) DEVI Please check blood pressure twice a day. In the morning and evening. Please record numbers  on a sheet of paper. Blood pressure parameters are 120-150/80-90   calcitRIOL (ROCALTROL) 0.25 MCG capsule Take 0.25 mcg by mouth daily.   cefdinir (OMNICEF) 300 MG capsule Take 1 capsule (300 mg total) by mouth daily.   clobetasol cream (TEMOVATE) 0.05 % APPLY TOPICALLY TWO (TWO) TIMES DAILY. (Patient taking differently: Apply 1 application topically 2 (two) times daily.)   docusate sodium (COLACE) 100 MG capsule Take 1 capsule (100 mg total) by mouth daily.   finasteride (PROSCAR) 5 MG tablet TAKE ONE TABLET BY MOUTH DAILY   furosemide (LASIX) 40 MG tablet TAKE ONE TABLET BY MOUTH TWICE A DAY   hydrALAZINE (APRESOLINE) 10 MG tablet TAKE 1.5 TABLETS BY MOUTH THREE TIMES A DAY   nitroGLYCERIN (NITROSTAT) 0.4 MG SL tablet Place 1 tablet (0.4 mg total) under the tongue every 5 (five) minutes x 3 doses as needed for chest pain.   omeprazole (PRILOSEC) 40 MG capsule Take one capsule shortly before breakfast each morning   predniSONE (DELTASONE) 10 MG tablet Take 10 mg by mouth daily.    sodium bicarbonate 650 MG tablet TAKE TWO TABLETS BY MOUTH TWICE A DAY   tamsulosin (FLOMAX) 0.4 MG CAPS capsule TAKE TWO CAPSULES BY MOUTH DAILY   triamcinolone ointment (KENALOG) 0.1 % Apply 1 application topically 2 (two) times daily.   VELTASSA 8.4 g packet Take 1 packet by mouth daily.   No facility-administered encounter medications on file as of 11/22/2021.    Patient Active Problem List   Diagnosis Date Noted   Goals of care, counseling/discussion 11/30/2020   Dysphagia 08/23/2020   Psoriasis 04/23/2020   Normal anion gap metabolic  acidosis    Atrial fibrillation ([place]) 10/14/2018   Type 2 diabetes mellitus ([place]) 09/04/2017   Hemiballism 08/31/2017   Ischemic pain of foot, right    Severe peripheral arterial disease ([place]) 02/27/2017   Benign prostatic hyperplasia with urinary obstruction    Orthostatic hypotension 04/04/2016   Urinary retention 03/20/2016   Cerebrovascular accident (CVA) due to  occlusion of left posterior cerebral artery (HCC)    Constipation 11/07/2014   Essential hypertension    Normocytic anemia 10/20/2014   Hyporeninemic hypoaldosteronism ([place]) 08/24/2014   Hyperkalemia 08/01/2014   Healthcare maintenance 07/13/2014   Atherosclerosis of leg with intermittent claudication, R 06/29/2014   Current smoker 06/27/2014   Alcohol use ([place]) 06/27/2014   CKD (chronic kidney disease), stage V ([place]) 06/26/2014    Conditions to be addressed/monitored: Atrial Fibrillation, HTN, DMII, and CKD Stage V  Care Plan : CCM RN- Level of care concerns  Updates made by Derrick Killian, RN since 11/22/2021 12:00 AM     Problem: Patient needs assistance with ADLS and IADLS.   Priority: High  Onset Date: 11/19/2020     Goal: CCM RN- Self-Management Plan Developed   Start Date: 11/19/2020  Expected End Date: 09/13/2021  Recent Progress: On track  Priority: High  Note:   Current Barriers: Successful outreach to patient's niece Derrick Hendrix, this morning.  She notes her Derrick Hendrix is not having any complaints.  She discussed his HTN when he comes to the clinic and thinks that he should have his right arm used for BP instead of his left arm due to his shaking in the left.  Patient was also due to come back into the clinic for blood work after his Nov 11 appt with Dr. Collene Hendrix and he has not been scheduled to date. Per Derrick Hendrix, she requested a call from someone to schedule.  Message sent to [place] desk staff to contact her for follow up. Care Coordination needs related to alternate housing and/or community assistance in a patient with NIDDM, HTN, HLD, CKD, A fib, s/p CVA with left sided spasticity -  Derrick Hendrix discussed [place] with patient and niece on 11/30/20.  Patient consented to referral for services.  Unable to perform ADLs independently Unable to perform IADLs independently  Nurse Case Manager Clinical Goal(s):  Over the next 30-60 days, patient will work with CCM to manage  his chronic constipation and anemia  Interventions:  Inter-disciplinary care team collaboration (see longitudinal plan of care) Reviewed patient's clinical status related to ESRD and not a candidate for dialysis and services provided by Hospice- Patient continues to refuse Hospice services and does not want to allow anyone in his home. Informed patient his problems with urination were not related to the food he ate but rather his end stage kidney disease Reinforced with patient that his health care team and niece want to honor his end of life wishes and maximize his quality of life via shared decision making with goal of keeping him comfortable in his home until he dies Will refer to CCM BSW to assist with removal of feral cats Reinforced the importance of patient notifying his niece or health care team at the time he wishes to stop aggressive care and focus on comfort care only  Patient Goals/Self-Care Activities Over the next 30-60 days, patient will:  - work with CCM team to direct shared decision making  Follow Up Plan: The care management team will reach out to the patient again over the next 30-60 days.  Plan: The patient has been provided with contact information for the care management team and has been advised to call with any health related questions or concerns.   Derrick Killian, RN, BSN, CCM Care Management Coordinator [facility] Phone: [phone]: [phone]

## 2021-11-22 NOTE — Patient Instructions (Signed)
Visit Information  Thank you for taking time to visit with me today. Please don't hesitate to contact me if I can be of assistance to you before our next scheduled telephone appointment.  Our next appointment is by telephone on 01/03/22 at 0900  Please call the care guide team at [phone] if you need to cancel or reschedule your appointment.   If you are experiencing a Mental Health or [place] or need someone to talk to, please call the [place] National Suicide Prevention Lifeline: 443-341-2005 or TTY: (972) 722-4759 TTY 812-044-1325) to talk to a trained counselor   The patient verbalized understanding of instructions, educational materials, and care plan provided today and declined offer to receive copy of patient instructions, educational materials, and care plan.   The patient has been provided with contact information for the care management team and has been advised to call with any health related questions or concerns.   Johnney Killian, RN, BSN, CCM Care Management Coordinator [facility] Phone: [phone]: [phone]

## 2021-11-25 ENCOUNTER — Telehealth: Payer: Self-pay | Admitting: Student

## 2021-11-25 NOTE — Telephone Encounter (Signed)
-----   Message from Johnney Killian, RN sent at 11/22/2021 10:00 AM EST ----- Regarding: Needs to come in for blood work Good morning, I had follow up with patient this morning.  He was suppose to come in for follow up blood work per Dr. Collene Gobble; K 6.2 today. Discussed with patient, who reports he has not taken his Veltassa yet today. I discussed with him to take double the dose today and resume his regular dose tomorrow. Patient to follow-up within the next week to repeat blood work. - Veltassa 16.8g today, re-start 8.4g daily tomorrow - F/u repeat BMP  Patients niece requested you call her to schedule appointment for blood work.  She said please leave a message if you cannot reach her. Desman Polak- [phone]  Thanks, Alycia Rossetti, RN, BSN, CCM Care Management Coordinator [facility] Phone: [phone]: [phone]

## 2021-11-25 NOTE — Telephone Encounter (Signed)
Please refer to message below.  Attempted to contact patient to schedule a lab appointment at the request of Johnney Killian, but no answer. Left message asking to call the clinic back.

## 2021-11-29 ENCOUNTER — Other Ambulatory Visit: Payer: Medicare HMO

## 2021-11-29 ENCOUNTER — Encounter (HOSPITAL_COMMUNITY)
Admission: RE | Admit: 2021-11-29 | Discharge: 2021-11-29 | Disposition: A | Discharge status: Home / Self Care | Payer: Medicare HMO | Source: Ambulatory Visit | Attending: Nephrology | Admitting: Nephrology

## 2021-11-29 VITALS — BP 155/88 | HR 87 | Temp 97.4°F | Resp 18

## 2021-11-29 DIAGNOSIS — E875 Hyperkalemia: Secondary | ICD-10-CM | POA: Diagnosis present

## 2021-11-29 DIAGNOSIS — N185 Chronic kidney disease, stage 5: Secondary | ICD-10-CM

## 2021-11-29 LAB — BASIC METABOLIC PANEL
Anion gap: 7 (ref 5–15)
BUN: 99 mg/dL — ABNORMAL HIGH (ref 8–23)
CO2: 19 mmol/L — ABNORMAL LOW (ref 22–32)
Calcium: 8.9 mg/dL (ref 8.9–10.3)
Chloride: 112 mmol/L — ABNORMAL HIGH (ref 98–111)
Creatinine, Ser: 6.13 mg/dL — ABNORMAL HIGH (ref 0.61–1.24)
GFR, Estimated: 9 mL/min — ABNORMAL LOW (ref >60)
Glucose, Bld: 116 mg/dL — ABNORMAL HIGH (ref 70–99)
Potassium: 5.8 mmol/L — ABNORMAL HIGH (ref 3.5–5.1)
Sodium: 138 mmol/L (ref 135–145)

## 2021-11-29 LAB — IRON AND TIBC
Iron: 99 ug/dL (ref 45–182)
Saturation Ratios: 31 % (ref 17.9–39.5)
TIBC: 318 ug/dL (ref 250–450)
UIBC: 219 ug/dL

## 2021-11-29 LAB — FERRITIN: Ferritin: 229 ng/mL (ref 24–336)

## 2021-11-29 LAB — POCT HEMOGLOBIN-HEMACUE: Hemoglobin: 12.4 g/dL — ABNORMAL LOW (ref 13.0–17.0)

## 2021-11-29 MED ORDER — EPOETIN ALFA 10000 UNIT/ML IJ SOLN: 10000.0000 [IU] | INTRAMUSCULAR | Status: DC

## 2021-12-06 ENCOUNTER — Encounter (HOSPITAL_COMMUNITY): Payer: Medicare HMO

## 2021-12-10 ENCOUNTER — Telehealth: Payer: Self-pay | Admitting: Licensed Clinical Social Worker

## 2021-12-10 ENCOUNTER — Telehealth: Payer: Medicare HMO | Admitting: Licensed Clinical Social Worker

## 2021-12-10 NOTE — Telephone Encounter (Signed)
°  Care Management   Follow Up Note   12/10/2021 Name: Derrick Hendrix. MRN: [id number] DOB: 1941-05-27   Referred by: [name], MD Reason for referral : No chief complaint on file.   An unsuccessful telephone outreach was attempted today. The patient was referred to the case management team for assistance with care management and care coordination.   Follow Up Plan: The care management team will reach out to the patient again over the next 30 days.   Milus Height, [place]  Social Worker [facility] Care Management  [phone]

## 2021-12-13 ENCOUNTER — Encounter (HOSPITAL_COMMUNITY)
Admission: RE | Admit: 2021-12-13 | Discharge: 2021-12-13 | Disposition: A | Discharge status: Home / Self Care | Payer: Medicare HMO | Source: Ambulatory Visit | Attending: Nephrology | Admitting: Nephrology

## 2021-12-13 ENCOUNTER — Other Ambulatory Visit: Payer: Self-pay

## 2021-12-13 VITALS — BP 161/75 | HR 75 | Temp 97.5°F

## 2021-12-13 DIAGNOSIS — N185 Chronic kidney disease, stage 5: Secondary | ICD-10-CM

## 2021-12-13 LAB — POCT HEMOGLOBIN-HEMACUE: Hemoglobin: 10.9 g/dL — ABNORMAL LOW (ref 13.0–17.0)

## 2021-12-13 MED ORDER — EPOETIN ALFA 10000 UNIT/ML IJ SOLN
INTRAMUSCULAR | Status: AC
  Filled 2021-12-13: qty 1

## 2021-12-13 MED ORDER — EPOETIN ALFA 10000 UNIT/ML IJ SOLN
10000.0000 [IU] | INTRAMUSCULAR | Status: DC
  Administered 2021-12-13: 09:00:00 10000 [IU] via SUBCUTANEOUS

## 2021-12-20 ENCOUNTER — Inpatient Hospital Stay (HOSPITAL_COMMUNITY): Admission: RE | Admit: 2021-12-20 | Payer: Medicare HMO | Source: Ambulatory Visit

## 2021-12-27 ENCOUNTER — Encounter (HOSPITAL_COMMUNITY): Payer: Medicare HMO

## 2021-12-27 ENCOUNTER — Encounter (HOSPITAL_COMMUNITY)
Admission: RE | Admit: 2021-12-27 | Discharge: 2021-12-27 | Disposition: A | Discharge status: Home / Self Care | Payer: Medicare HMO | Source: Ambulatory Visit | Attending: Nephrology | Admitting: Nephrology

## 2021-12-27 ENCOUNTER — Other Ambulatory Visit: Payer: Self-pay

## 2021-12-27 VITALS — BP 165/66 | HR 90 | Temp 97.3°F | Resp 20

## 2021-12-27 DIAGNOSIS — N185 Chronic kidney disease, stage 5: Secondary | ICD-10-CM | POA: Insufficient documentation

## 2021-12-27 LAB — FERRITIN: Ferritin: 292 ng/mL (ref 24–336)

## 2021-12-27 LAB — IRON AND TIBC
Iron: 95 ug/dL (ref 45–182)
Saturation Ratios: 34 % (ref 17.9–39.5)
TIBC: 280 ug/dL (ref 250–450)
UIBC: 185 ug/dL

## 2021-12-27 LAB — POCT HEMOGLOBIN-HEMACUE: Hemoglobin: 11.6 g/dL — ABNORMAL LOW (ref 13.0–17.0)

## 2021-12-27 MED ORDER — EPOETIN ALFA 10000 UNIT/ML IJ SOLN
10000.0000 [IU] | INTRAMUSCULAR | Status: DC
  Administered 2021-12-27: 10000 [IU] via SUBCUTANEOUS

## 2021-12-27 MED ORDER — EPOETIN ALFA 10000 UNIT/ML IJ SOLN
INTRAMUSCULAR | Status: AC
  Filled 2021-12-27: qty 1

## 2022-01-01 ENCOUNTER — Telehealth: Payer: Self-pay | Admitting: *Deleted

## 2022-01-01 NOTE — Chronic Care Management (AMB) (Signed)
°  Care Management   Note  01/01/2022 Name: Derrick Hendrix. MRN: [id number] DOB: 01/22/1941  [name]. is a 81 y.o. year old male who is a primary care patient of [name], MD and is actively engaged with the care management team. I reached out to [name]. by phone today to assist with re-scheduling a follow up visit with the RN Case Manager  Follow up plan: Unsuccessful telephone outreach attempt made. A HIPAA compliant phone message was left for the patient providing contact information and requesting a return call.  The care management team will reach out to the patient again over the next 7 days.  If patient returns call to provider office, please advise to call [place] at [phone].  [place] Management  Direct Dial: [phone]

## 2022-01-03 ENCOUNTER — Other Ambulatory Visit: Payer: Self-pay

## 2022-01-03 ENCOUNTER — Encounter (HOSPITAL_COMMUNITY)
Admission: RE | Admit: 2022-01-03 | Discharge: 2022-01-03 | Disposition: A | Discharge status: Home / Self Care | Payer: Medicare HMO | Source: Ambulatory Visit | Attending: Nephrology | Admitting: Nephrology

## 2022-01-03 ENCOUNTER — Telehealth: Payer: Medicare HMO

## 2022-01-03 VITALS — BP 174/110 | HR 82 | Temp 97.4°F | Resp 20

## 2022-01-03 DIAGNOSIS — N185 Chronic kidney disease, stage 5: Secondary | ICD-10-CM

## 2022-01-03 MED ORDER — EPOETIN ALFA 10000 UNIT/ML IJ SOLN: 10000.0000 [IU] | INTRAMUSCULAR | Status: DC

## 2022-01-08 ENCOUNTER — Other Ambulatory Visit: Payer: Self-pay | Admitting: Gastroenterology

## 2022-01-09 NOTE — Chronic Care Management (AMB) (Signed)
°  Care Management   Note  01/09/2022 Name: [name]. MRN: [id number] DOB: [birth year]  Derrick Hendrix. is a 81 y.o. year old male who is a primary care patient of [name], MD and is actively engaged with the care management team. I reached out to [name]. by phone today to assist with re-scheduling a follow up visit with the RN Case Manager  Follow up plan: Telephone appointment with care management team member scheduled for:01/14/22  [place] Management  Direct Dial: [phone]

## 2022-01-10 ENCOUNTER — Encounter (HOSPITAL_COMMUNITY)
Admission: RE | Admit: 2022-01-10 | Discharge: 2022-01-10 | Disposition: A | Discharge status: Home / Self Care | Payer: Medicare HMO | Source: Ambulatory Visit | Attending: Nephrology | Admitting: Nephrology

## 2022-01-10 VITALS — BP 111/64 | HR 94

## 2022-01-10 DIAGNOSIS — N185 Chronic kidney disease, stage 5: Secondary | ICD-10-CM | POA: Diagnosis not present

## 2022-01-10 LAB — POCT HEMOGLOBIN-HEMACUE: Hemoglobin: 10.9 g/dL — ABNORMAL LOW (ref 13.0–17.0)

## 2022-01-10 MED ORDER — EPOETIN ALFA 10000 UNIT/ML IJ SOLN
INTRAMUSCULAR | Status: AC
  Filled 2022-01-10: qty 1

## 2022-01-10 MED ORDER — EPOETIN ALFA 10000 UNIT/ML IJ SOLN
10000.0000 [IU] | INTRAMUSCULAR | Status: DC
  Administered 2022-01-10: 10000 [IU] via SUBCUTANEOUS

## 2022-01-14 ENCOUNTER — Telehealth: Payer: Self-pay

## 2022-01-14 ENCOUNTER — Telehealth: Payer: Medicare HMO

## 2022-01-14 ENCOUNTER — Ambulatory Visit: Payer: Medicare HMO

## 2022-01-14 NOTE — Telephone Encounter (Signed)
°  Care Management   Outreach Note  01/14/2022 Name: Arch Methot. MRN: [id number] DOB: 03/15/1941  Referred by: [name], MD Reason for referral : No chief complaint on file.   An unsuccessful telephone outreach was attempted today. The patient was referred to the case management team for assistance with care management and care coordination.   Follow Up Plan: Telephone follow up appointment with care management team member scheduled for: 01/15/22  Johnney Killian, RN, BSN, [facility] Phone: [phone]: [phone]

## 2022-01-15 ENCOUNTER — Telehealth: Payer: Medicare HMO

## 2022-01-15 NOTE — Patient Instructions (Signed)
Visit Information  Thank you for taking time to visit with me today. Please don't hesitate to contact me if I can be of assistance to you before our next scheduled telephone appointment.  Our next appointment is by telephone on 02/12/22 at 0900.  Please call the care guide team at [phone] if you need to cancel or reschedule your appointment.   If you are experiencing a Mental Health or [place] or need someone to talk to, please call the [place] National Suicide Prevention Lifeline: (514) 104-4803 or TTY: 225-190-3912 TTY 6282662669) to talk to a trained counselor   Patient verbalizes understanding of instructions and care plan provided today and agrees to view in [place]. Active MyChart status confirmed with patient.    The patient has been provided with contact information for the care management team and has been advised to call with any health related questions or concerns.   Johnney Killian, RN, BSN, CCM Care Management Coordinator [facility] Internal Medicine Phone: [phone]: [phone]

## 2022-01-15 NOTE — Chronic Care Management (AMB) (Signed)
Care Management    RN Visit Note  01/15/2022 Name: Derrick Hendrix. MRN: [id number] DOB: 08/01/1941  Subjective: Derrick Hendrix. is a 81 y.o. year old male who is a primary care patient of Derrick Dame, MD. The care management team was consulted for assistance with disease management and care coordination needs.    Engaged with patient by telephone for follow up visit in response to provider referral for case management and/or care coordination services.   Consent to Services:   [name] was given information about Care Management services today including:  Care Management services includes personalized support from designated clinical staff supervised by his physician, including individualized plan of care and coordination with other care providers 24/7 contact phone numbers for assistance for urgent and routine care needs. The patient may stop case management services at any time by phone call to the office staff.  Patient agreed to services and consent obtained.   Assessment: Review of patient past medical history, allergies, medications, health status, including review of consultants reports, laboratory and other test data, was performed as part of comprehensive evaluation and provision of chronic care management services.   SDOH (Social Determinants of Health) assessments and interventions performed:    Care Plan  Allergies  Allergen Reactions   Aspirin Other (See Comments)    Nosebleeds     Outpatient Encounter Medications as of 01/14/2022  Medication Sig   amLODipine (NORVASC) 10 MG tablet TAKE ONE TABLET BY MOUTH DAILY (Patient taking differently: Take 10 mg by mouth daily.)   apixaban (ELIQUIS) 2.5 MG TABS tablet TAKE ONE TABLET BY MOUTH TWICE A DAY   atorvastatin (LIPITOR) 80 MG tablet TAKE ONE TABLET BY MOUTH DAILY   Blood Pressure Monitoring (BLOOD PRESSURE KIT) DEVI Please check blood pressure twice a day. In the morning and evening. Please record numbers on  a sheet of paper. Blood pressure parameters are 120-150/80-90   calcitRIOL (ROCALTROL) 0.25 MCG capsule Take 0.25 mcg by mouth daily.   cefdinir (OMNICEF) 300 MG capsule Take 1 capsule (300 mg total) by mouth daily.   clobetasol cream (TEMOVATE) 0.05 % APPLY TOPICALLY TWO (TWO) TIMES DAILY. (Patient taking differently: Apply 1 application topically 2 (two) times daily.)   docusate sodium (COLACE) 100 MG capsule Take 1 capsule (100 mg total) by mouth daily.   finasteride (PROSCAR) 5 MG tablet TAKE ONE TABLET BY MOUTH DAILY   furosemide (LASIX) 40 MG tablet TAKE ONE TABLET BY MOUTH TWICE A DAY   hydrALAZINE (APRESOLINE) 10 MG tablet TAKE 1.5 TABLETS BY MOUTH THREE TIMES A DAY   nitroGLYCERIN (NITROSTAT) 0.4 MG SL tablet Place 1 tablet (0.4 mg total) under the tongue every 5 (five) minutes x 3 doses as needed for chest pain.   omeprazole (PRILOSEC) 40 MG capsule TAKE ONE CAPSULE SHORTLY BEFORE BREAKFAST EACH MORNING   predniSONE (DELTASONE) 10 MG tablet Take 10 mg by mouth daily.    sodium bicarbonate 650 MG tablet TAKE TWO TABLETS BY MOUTH TWICE A DAY   tamsulosin (FLOMAX) 0.4 MG CAPS capsule TAKE TWO CAPSULES BY MOUTH DAILY   triamcinolone ointment (KENALOG) 0.1 % Apply 1 application topically 2 (two) times daily.   VELTASSA 8.4 g packet Take 1 packet by mouth daily.   No facility-administered encounter medications on file as of 01/14/2022.    Patient Active Problem List   Diagnosis Date Noted   Goals of care, counseling/discussion 11/30/2020   Dysphagia 08/23/2020   Psoriasis 04/23/2020   Normal anion gap metabolic  acidosis    Atrial fibrillation ([place]) 10/14/2018   Type 2 diabetes mellitus ([place]) 09/04/2017   Hemiballism 08/31/2017   Ischemic pain of foot, right    Severe peripheral arterial disease ([place]) 02/27/2017   Benign prostatic hyperplasia with urinary obstruction    Orthostatic hypotension 04/04/2016   Urinary retention 03/20/2016   Cerebrovascular accident (CVA) due to  occlusion of left posterior cerebral artery (HCC)    Constipation 11/07/2014   Essential hypertension    Normocytic anemia 10/20/2014   Hyporeninemic hypoaldosteronism ([place]) 08/24/2014   Hyperkalemia 08/01/2014   Healthcare maintenance 07/13/2014   Atherosclerosis of leg with intermittent claudication, R 06/29/2014   Current smoker 06/27/2014   Alcohol use ([place]) 06/27/2014   CKD (chronic kidney disease), stage V ([place]) 06/26/2014    Conditions to be addressed/monitored: HTN, DMII, and CKD Stage 5  Care Plan : CCM RN- Level of care concerns  Updates made by Johnney Killian, RN since 01/15/2022 12:00 AM     Problem: Patient needs assistance with ADLS and IADLS.   Priority: High  Onset Date: 11/19/2020     Goal: CCM RN- Self-Management Plan Developed   Start Date: 11/19/2020  Expected End Date: 09/13/2021  Recent Progress: On track  Priority: High  Note:   Current Barriers: Unsuccessful outreach to [place], patients niece this morning.  Reached out to patient and was able to speak at length with him.  Patient shared he is having some itchy skin lately from his ESRF and he had some issues with his left foot arch.    He noted that his foot is better after a few days of soaking it.  We discussed that he needs to make an appointment with Dr. Collene Gobble in March. He also stated he continues to go have his epoetin injections weekly and they make him feel better.  He also explained that he has numerous broken pipes in his home and he does not have running water.  He has been using bottled water to bathe and flush his toilet.  His landlord knows about the issues and has not had them fixed.  This RNCM called [name] again and was able to reach her this afternoon.  Per Derrick Hendrix, she is aware of her uncle's issue with lack of running water and she requested a call from Milus Height, [place] to look at options for the patient.  She requested a call somewhere around 11am which is a good time for her work  schedule.  This RNCM called and updated Milus Height, BSW about patients current situation with his lack of running water and she is going to reach out to [place] and patient. Knowledge Deficits related to plan of care for management of HTN, DMII, and CKD Stage 5  Lacks caregiver support Astronomer barriers  RNCM Clinical Goal(s):  Patient will take all medications exactly as prescribed and will call provider for medication related questions as evidenced by refill needs and timing or refills. continue to work with RN Care Manager to address care management and care coordination needs related to  HTN, DMII, and CKD Stage 5 as evidenced by adherence to CM Team Scheduled appointments work with Education officer, museum to address  related to the management of Limited social support, Transportation, and Housing barriers related to the management of HTN, DMII, and CKD Stage 5 as evidenced by review of EMR and patient or Education officer, museum report through collaboration with Consulting civil engineer, provider, and care team.   Interventions: 1:1 collaboration  with primary care provider regarding development and update of comprehensive plan of care as evidenced by provider attestation and co-signature Inter-disciplinary care team collaboration (see longitudinal plan of care) Evaluation of current treatment plan related to  self management and patient's adherence to plan as established by provider  Hypertension Interventions:  (Status:  Goal on track:  Yes.) Long Term Goal Last practice recorded BP readings:  BP Readings from Last 3 Encounters:  01/10/22 111/64  01/03/22 (!) 185/92  12/27/21 (!) 165/66  Most recent eGFR/CrCl: No results found for: EGFR  No components found for: CRCL  Evaluation of current treatment plan related to hypertension self management and patient's adherence to plan as established by provider Reviewed medications with patient and discussed importance of compliance Reviewed  scheduled/upcoming provider appointments including:   Patient Goals/Self-Care Activities: Take all medications as prescribed Attend all scheduled provider appointments Perform all self care activities independently  Call provider office for new concerns or questions  Work with the social worker to address care coordination needs and will continue to work with the clinical team to address health care and disease management related needs  Interventions:  Inter-disciplinary care team collaboration (see longitudinal plan of care) Reviewed patient's clinical status related to ESRD and not a candidate for dialysis and services provided by Hospice- Patient continues to refuse Hospice services and does not want to allow anyone in his home. Informed patient his problems with urination were not related to the food he ate but rather his end stage kidney disease Reinforced with patient that his health care team and niece want to honor his end of life wishes and maximize his quality of life via shared decision making with goal of keeping him comfortable in his home until he dies Will refer to CCM BSW to assist with removal of feral cats Reinforced the importance of patient notifying his niece or health care team at the time he wishes to stop aggressive care and focus on comfort care only  Patient Goals/Self-Care Activities Over the next 30-60 days, patient will:  - work with CCM team to direct shared decision making    Plan: The patient has been provided with contact information for the care management team and has been advised to call with any health related questions or concerns.   Johnney Killian, RN, BSN, CCM Care Management Coordinator [facility] Internal Medicine Phone: [phone]: [phone]

## 2022-01-17 ENCOUNTER — Encounter (HOSPITAL_COMMUNITY)
Admission: RE | Admit: 2022-01-17 | Discharge: 2022-01-17 | Disposition: A | Discharge status: Home / Self Care | Payer: Medicare HMO | Source: Ambulatory Visit | Attending: Nephrology | Admitting: Nephrology

## 2022-01-17 DIAGNOSIS — N185 Chronic kidney disease, stage 5: Secondary | ICD-10-CM | POA: Insufficient documentation

## 2022-01-17 MED ORDER — EPOETIN ALFA 10000 UNIT/ML IJ SOLN: 10000.0000 [IU] | INTRAMUSCULAR | Status: DC

## 2022-01-17 NOTE — Progress Notes (Signed)
Spoke with Coralyn Mark at [facility] re: BP 188/92- 174/110. Patient stated he did not take BP meds(worried of pressure to left shoulder, denied vision issues). Per Dr. Justin Mend patient should go home and take BP meds/ Hold injection today. Patient was instructed accordingly.

## 2022-01-20 DIAGNOSIS — L603 Nail dystrophy: Secondary | ICD-10-CM | POA: Diagnosis not present

## 2022-01-20 DIAGNOSIS — B351 Tinea unguium: Secondary | ICD-10-CM | POA: Diagnosis not present

## 2022-01-20 DIAGNOSIS — L84 Corns and callosities: Secondary | ICD-10-CM | POA: Diagnosis not present

## 2022-01-20 DIAGNOSIS — I739 Peripheral vascular disease, unspecified: Secondary | ICD-10-CM | POA: Diagnosis not present

## 2022-01-20 DIAGNOSIS — I70203 Unspecified atherosclerosis of native arteries of extremities, bilateral legs: Secondary | ICD-10-CM | POA: Diagnosis not present

## 2022-01-24 ENCOUNTER — Inpatient Hospital Stay (HOSPITAL_COMMUNITY): Admission: RE | Admit: 2022-01-24 | Payer: Medicare HMO | Source: Ambulatory Visit

## 2022-01-31 ENCOUNTER — Encounter (HOSPITAL_COMMUNITY): Payer: Medicare HMO

## 2022-02-07 ENCOUNTER — Encounter (HOSPITAL_COMMUNITY)
Admission: RE | Admit: 2022-02-07 | Discharge: 2022-02-07 | Disposition: A | Discharge status: Home / Self Care | Payer: Medicare HMO | Source: Ambulatory Visit | Attending: Nephrology | Admitting: Nephrology

## 2022-02-07 VITALS — BP 170/104 | HR 94 | Temp 97.2°F | Resp 19

## 2022-02-07 DIAGNOSIS — N185 Chronic kidney disease, stage 5: Secondary | ICD-10-CM | POA: Diagnosis not present

## 2022-02-07 LAB — IRON AND TIBC
Iron: 98 ug/dL (ref 45–182)
Saturation Ratios: 34 % (ref 17.9–39.5)
TIBC: 291 ug/dL (ref 250–450)
UIBC: 193 ug/dL

## 2022-02-07 LAB — FERRITIN: Ferritin: 296 ng/mL (ref 24–336)

## 2022-02-07 LAB — POCT HEMOGLOBIN-HEMACUE: Hemoglobin: 11.2 g/dL — ABNORMAL LOW (ref 13.0–17.0)

## 2022-02-07 MED ORDER — EPOETIN ALFA 10000 UNIT/ML IJ SOLN
10000.0000 [IU] | INTRAMUSCULAR | Status: DC
  Administered 2022-02-07: 10000 [IU] via SUBCUTANEOUS

## 2022-02-07 MED ORDER — EPOETIN ALFA 10000 UNIT/ML IJ SOLN
INTRAMUSCULAR | Status: AC
  Filled 2022-02-07: qty 1

## 2022-02-12 ENCOUNTER — Telehealth: Payer: Medicare HMO

## 2022-02-12 ENCOUNTER — Telehealth: Payer: Self-pay

## 2022-02-12 NOTE — Telephone Encounter (Signed)
?  Care Management  ? ?Outreach Note ? ?02/12/2022 ?Name: Derrick Hendrix. MRN: [id number] DOB: [birth year] ? ?Referred by: [name], MD ?Reason for referral : No chief complaint on file. ? ? ?An unsuccessful telephone outreach was attempted today. The patient was referred to the case management team for assistance with care management and care coordination.  ? ?Follow Up Plan: If patient returns call to provider office, please advise to call [place] at [phone]. ? ?Johnney Killian, RN, BSN, CCM ?Care Management Coordinator ?[place] [facility] ?Phone: [phone]/QHU: [phone]  ?

## 2022-02-13 ENCOUNTER — Other Ambulatory Visit (HOSPITAL_COMMUNITY): Payer: Self-pay | Admitting: Nurse Practitioner

## 2022-02-13 NOTE — Telephone Encounter (Signed)
Prescription refill request for Eliquis received. ?Indication: Afib  ?Last office visit:12/19/20 ([facility])  ?Scr: 6.13 (11/29/21)  ?Age: 81 ?Weight: 87.3kg ? ?Pt overdue for office visit. Message sent to schedulers.  ?

## 2022-02-14 ENCOUNTER — Ambulatory Visit: Payer: Medicare HMO

## 2022-02-14 ENCOUNTER — Encounter (HOSPITAL_COMMUNITY)
Admission: RE | Admit: 2022-02-14 | Discharge: 2022-02-14 | Disposition: A | Discharge status: Home / Self Care | Payer: Medicare HMO | Source: Ambulatory Visit | Attending: Nephrology | Admitting: Nephrology

## 2022-02-14 VITALS — BP 157/93 | HR 91 | Temp 97.4°F | Resp 18

## 2022-02-14 DIAGNOSIS — N185 Chronic kidney disease, stage 5: Secondary | ICD-10-CM | POA: Diagnosis not present

## 2022-02-14 LAB — POCT HEMOGLOBIN-HEMACUE: Hemoglobin: 12.1 g/dL — ABNORMAL LOW (ref 13.0–17.0)

## 2022-02-14 MED ORDER — EPOETIN ALFA 10000 UNIT/ML IJ SOLN: 10000.0000 [IU] | INTRAMUSCULAR | Status: DC

## 2022-02-14 NOTE — Patient Instructions (Signed)
Visit Information ? ?Thank you for taking time to visit with me today. Please don't hesitate to contact me if I can be of assistance to you before our next scheduled telephone appointment. ? ?Our next appointment is by telephone on 04/01/22 at 11:00 ? ?Please call the care guide team at [phone] if you need to cancel or reschedule your appointment.  ? ?If you are experiencing a Mental Health or [place] or need someone to talk to, please call the [place] National Suicide Prevention Lifeline: 803-522-9601 or TTY: [phone] TTY [phone]) to talk to a trained counselor  ? ?Patient verbalizes understanding of instructions and care plan provided today and agrees to view in [place]. Active MyChart status confirmed with patient.   ? ?The patient has been provided with contact information for the care management team and has been advised to call with any health related questions or concerns.  ? ?Johnney Killian, RN, BSN, CCM ?Care Management Coordinator ?[facility] ?Phone: [phone]/LXB: [phone]  ?

## 2022-02-14 NOTE — Chronic Care Management (AMB) (Signed)
Care Management    RN Visit Note  02/14/2022 Name: Derrick Hendrix. MRN: [id number] DOB: 1941-11-07  Subjective: Derrick Hendrix. is a 81 y.o. year old male who is a primary care patient of Derrick Kanner, MD. The care management team was consulted for assistance with disease management and care coordination needs.    Engaged with patient by telephone for follow up visit in response to provider referral for case management and/or care coordination services.   Consent to Services:   Derrick Hendrix was given information about Care Management services today including:  Care Management services includes personalized support from designated clinical staff supervised by his physician, including individualized plan of care and coordination with other care providers 24/7 contact phone numbers for assistance for urgent and routine care needs. The patient may stop case management services at any time by phone call to the office staff.  Patient agreed to services and consent obtained.   Assessment: Review of patient past medical history, allergies, medications, health status, including review of consultants reports, laboratory and other test data, was performed as part of comprehensive evaluation and provision of chronic care management services.   SDOH (Social Determinants of Health) assessments and interventions performed:    Care Plan  Allergies  Allergen Reactions   Aspirin Other (See Comments)    Nosebleeds     Outpatient Encounter Medications as of 02/14/2022  Medication Sig   amLODipine (NORVASC) 10 MG tablet TAKE ONE TABLET BY MOUTH DAILY (Patient taking differently: Take 10 mg by mouth daily.)   apixaban (ELIQUIS) 2.5 MG TABS tablet TAKE ONE TABLET BY MOUTH TWICE A DAY   atorvastatin (LIPITOR) 80 MG tablet TAKE ONE TABLET BY MOUTH DAILY   Blood Pressure Monitoring (BLOOD PRESSURE KIT) DEVI Please check blood pressure twice a day. In the morning and evening. Please record numbers on  a sheet of paper. Blood pressure parameters are 120-150/80-90   calcitRIOL (ROCALTROL) 0.25 MCG capsule Take 0.25 mcg by mouth daily.   cefdinir (OMNICEF) 300 MG capsule Take 1 capsule (300 mg total) by mouth daily.   clobetasol cream (TEMOVATE) 0.05 % APPLY TOPICALLY TWO (TWO) TIMES DAILY. (Patient taking differently: Apply 1 application topically 2 (two) times daily.)   docusate sodium (COLACE) 100 MG capsule Take 1 capsule (100 mg total) by mouth daily.   finasteride (PROSCAR) 5 MG tablet TAKE ONE TABLET BY MOUTH DAILY   furosemide (LASIX) 40 MG tablet TAKE ONE TABLET BY MOUTH TWICE A DAY   hydrALAZINE (APRESOLINE) 10 MG tablet TAKE 1.5 TABLETS BY MOUTH THREE TIMES A DAY   nitroGLYCERIN (NITROSTAT) 0.4 MG SL tablet Place 1 tablet (0.4 mg total) under the tongue every 5 (five) minutes x 3 doses as needed for chest pain.   omeprazole (PRILOSEC) 40 MG capsule TAKE ONE CAPSULE SHORTLY BEFORE BREAKFAST EACH MORNING   predniSONE (DELTASONE) 10 MG tablet Take 10 mg by mouth daily.    sodium bicarbonate 650 MG tablet TAKE TWO TABLETS BY MOUTH TWICE A DAY   tamsulosin (FLOMAX) 0.4 MG CAPS capsule TAKE TWO CAPSULES BY MOUTH DAILY   triamcinolone ointment (KENALOG) 0.1 % Apply 1 application topically 2 (two) times daily.   VELTASSA 8.4 g packet Take 1 packet by mouth daily.   Facility-Administered Encounter Medications as of 02/14/2022  Medication   epoetin alfa (EPOGEN) injection 10,000 Units    Patient Active Problem List   Diagnosis Date Noted   Goals of care, counseling/discussion 11/30/2020   Dysphagia 08/23/2020   Psoriasis  04/23/2020   Normal anion gap metabolic acidosis    Atrial fibrillation ([place]) 10/14/2018   Type 2 diabetes mellitus ([place]) 09/04/2017   Hemiballism 08/31/2017   Ischemic pain of foot, right    Severe peripheral arterial disease ([place]) 02/27/2017   Benign prostatic hyperplasia with urinary obstruction    Orthostatic hypotension 04/04/2016   Urinary retention 03/20/2016    Cerebrovascular accident (CVA) due to occlusion of left posterior cerebral artery (HCC)    Constipation 11/07/2014   Essential hypertension    Normocytic anemia 10/20/2014   Hyporeninemic hypoaldosteronism ([place]) 08/24/2014   Hyperkalemia 08/01/2014   Healthcare maintenance 07/13/2014   Atherosclerosis of leg with intermittent claudication, R 06/29/2014   Current smoker 06/27/2014   Alcohol use ([place]) 06/27/2014   CKD (chronic kidney disease), stage V ([place]) 06/26/2014    Conditions to be addressed/monitored: HTN and ESRD  Care Plan : CCM RN- Level of care concerns  Updates made by Derrick Killian, RN since 02/14/2022 12:00 AM     Problem: Patient needs assistance with ADLS and IADLS.   Priority: High  Onset Date: 11/19/2020     Goal: CCM RN- Self-Management Plan Developed   Start Date: 11/19/2020  Expected End Date: 09/13/2021  Recent Progress: On track  Priority: High  Note:   Current Barriers: Successful outreach to patients niece, Derrick Hendrix, this morning.  She states her uncle is doing well, he has had several times where his blood pressure is high and he was unable to get his EPOETIN injection.  Last week patients blood pressure was ok and he was able to get injection.  She shared he is still having issues with his landlord and repairs he needs to have to the pipes in his apartment.  He does have running water as the landlord hooked up a hose from his well.  [name] is still concerned about her uncle living there and is anxious to speak with Derrick Hendrix, BSW about ways for him to move.   Knowledge Deficits related to plan of care for management of HTN, DMII, and CKD Stage 5  Lacks caregiver support Astronomer barriers RNCM Clinical Goal(s):  Patient will take all medications exactly as prescribed and will call provider for medication related questions as evidenced by refill needs and timing or refills. continue to work with RN Care Manager to  address care management and care coordination needs related to  HTN, DMII, and CKD Stage 5 as evidenced by adherence to CM Team Scheduled appointments work with Education officer, museum to address  related to the management of Limited social support, Transportation, and Housing barriers related to the management of HTN, DMII, and CKD Stage 5 as evidenced by review of EMR and patient or Education officer, museum report through collaboration with Consulting civil engineer, provider, and care team.  Interventions: 1:1 collaboration with primary care provider regarding development and update of comprehensive plan of care as evidenced by provider attestation and co-signature Inter-disciplinary care team collaboration (see longitudinal plan of care) Evaluation of current treatment plan related to  self management and patient's adherence to plan as established by provider  Hypertension Interventions:  (Status:  Goal on track:  Yes.) Long Term Goal Last practice recorded BP readings:  BP Readings from Last 3 Encounters:  02/14/22 (!) 157/93  02/07/22 (!) 170/104  01/10/22 111/64  Most recent eGFR/CrCl: No results found for: EGFR  No components found for: CRCL  Evaluation of current treatment plan related to hypertension self management and patient's adherence to  plan as established by provider Reviewed medications with patient and discussed importance of compliance Reviewed scheduled/upcoming provider appointments including:   Patient Goals/Self-Care Activities: Take all medications as prescribed Attend all scheduled provider appointments Perform all self care activities independently  Call provider office for new concerns or questions  Work with the social worker to address care coordination needs and will continue to work with the clinical team to address health care and disease management related needs  Interventions:  Inter-disciplinary care team collaboration (see longitudinal plan of care) Reviewed patient's clinical  status related to ESRD and not a candidate for dialysis and services provided by Hospice- Patient continues to refuse Hospice services and does not want to allow anyone in his home. Informed patient his problems with urination were not related to the food he ate but rather his end stage kidney disease Reinforced with patient that his health care team and niece want to honor his end of life wishes and maximize his quality of life via shared decision making with goal of keeping him comfortable in his home until he dies Will refer to CCM BSW to assist with removal of feral cats Reinforced the importance of patient notifying his niece or health care team at the time he wishes to stop aggressive care and focus on comfort care only  Patient Goals/Self-Care Activities Over the next 30-60 days, patient will:  - work with CCM team to direct shared decision making  Follow Up Plan:  The patient has been provided with contact information for the care management team and has been advised to call with any health related questions or concerns.      Derrick Killian, RN, BSN, CCM Care Management Coordinator [facility] Phone: [phone]: [phone]

## 2022-02-18 NOTE — Telephone Encounter (Signed)
Pt has scheduled a f/u appt with Tommye Standard, PA on 03/21/22.  Will refill rx to get pt to upcoming appt.Based on Creat 6.13 on 11/29/21 and age 81, Eliquis 2.'5mg'$  BID is appropriate dosage.  ?

## 2022-02-21 ENCOUNTER — Encounter (HOSPITAL_COMMUNITY): Payer: Medicare HMO

## 2022-02-25 ENCOUNTER — Telehealth: Payer: Medicare HMO

## 2022-02-26 ENCOUNTER — Telehealth: Payer: Self-pay | Admitting: Licensed Clinical Social Worker

## 2022-02-26 NOTE — Telephone Encounter (Signed)
?  Care Management  ? ?Follow Up Note ? ? ?02/25/2022 ?Name: Derrick Hendrix. MRN: [id number] DOB: 1941/01/18 ? ? ?Referred by: [name], MD ?Reason for referral : No chief complaint on file. ? ? ?An unsuccessful telephone outreach was attempted today. The patient was referred to the case management team for assistance with care management and care coordination.  ? ?Follow Up Plan: The care management team will reach out to the patient again over the next 30 days.  ? ?Milus Height, BSW  ?Social Worker ?[facility]  ?[phone] ?  ?

## 2022-02-27 ENCOUNTER — Encounter: Payer: Medicare HMO | Admitting: Student

## 2022-02-28 ENCOUNTER — Other Ambulatory Visit: Payer: Self-pay

## 2022-02-28 ENCOUNTER — Ambulatory Visit (INDEPENDENT_AMBULATORY_CARE_PROVIDER_SITE_OTHER): Payer: Medicare HMO | Admitting: Student

## 2022-02-28 ENCOUNTER — Encounter (HOSPITAL_COMMUNITY)
Admission: RE | Admit: 2022-02-28 | Discharge: 2022-02-28 | Disposition: A | Discharge status: Home / Self Care | Payer: Medicare HMO | Source: Ambulatory Visit | Attending: Nephrology | Admitting: Nephrology

## 2022-02-28 VITALS — BP 166/92 | HR 101 | Temp 97.7°F | Wt 178.7 lb

## 2022-02-28 VITALS — HR 94 | Temp 97.3°F | Resp 18

## 2022-02-28 DIAGNOSIS — E1151 Type 2 diabetes mellitus with diabetic peripheral angiopathy without gangrene: Secondary | ICD-10-CM | POA: Diagnosis not present

## 2022-02-28 DIAGNOSIS — N185 Chronic kidney disease, stage 5: Secondary | ICD-10-CM | POA: Diagnosis not present

## 2022-02-28 DIAGNOSIS — G6289 Other specified polyneuropathies: Secondary | ICD-10-CM | POA: Diagnosis not present

## 2022-02-28 DIAGNOSIS — I10 Essential (primary) hypertension: Secondary | ICD-10-CM

## 2022-02-28 DIAGNOSIS — E875 Hyperkalemia: Secondary | ICD-10-CM

## 2022-02-28 DIAGNOSIS — I872 Venous insufficiency (chronic) (peripheral): Secondary | ICD-10-CM | POA: Diagnosis not present

## 2022-02-28 DIAGNOSIS — N183 Chronic kidney disease, stage 3 unspecified: Secondary | ICD-10-CM

## 2022-02-28 DIAGNOSIS — E1122 Type 2 diabetes mellitus with diabetic chronic kidney disease: Secondary | ICD-10-CM | POA: Diagnosis not present

## 2022-02-28 DIAGNOSIS — I739 Peripheral vascular disease, unspecified: Secondary | ICD-10-CM

## 2022-02-28 DIAGNOSIS — I129 Hypertensive chronic kidney disease with stage 1 through stage 4 chronic kidney disease, or unspecified chronic kidney disease: Secondary | ICD-10-CM

## 2022-02-28 LAB — POCT GLYCOSYLATED HEMOGLOBIN (HGB A1C): Hemoglobin A1C: 4.5 % (ref 4.0–5.6)

## 2022-02-28 LAB — BASIC METABOLIC PANEL
Anion gap: 12 (ref 5–15)
BUN: 87 mg/dL — ABNORMAL HIGH (ref 8–23)
CO2: 18 mmol/L — ABNORMAL LOW (ref 22–32)
Calcium: 8.7 mg/dL — ABNORMAL LOW (ref 8.9–10.3)
Chloride: 112 mmol/L — ABNORMAL HIGH (ref 98–111)
Creatinine, Ser: 5.92 mg/dL — ABNORMAL HIGH (ref 0.61–1.24)
GFR, Estimated: 9 mL/min — ABNORMAL LOW (ref >60)
Glucose, Bld: 87 mg/dL (ref 70–99)
Potassium: 5.9 mmol/L — ABNORMAL HIGH (ref 3.5–5.1)
Sodium: 142 mmol/L (ref 135–145)

## 2022-02-28 LAB — GLUCOSE, CAPILLARY: Glucose-Capillary: 76 mg/dL (ref 70–99)

## 2022-02-28 LAB — POCT HEMOGLOBIN-HEMACUE: Hemoglobin: 12 g/dL — ABNORMAL LOW (ref 13.0–17.0)

## 2022-02-28 MED ORDER — EPOETIN ALFA 10000 UNIT/ML IJ SOLN: 10000.0000 [IU] | INTRAMUSCULAR | Status: DC

## 2022-02-28 MED ORDER — GABAPENTIN 100 MG PO CAPS: 100.0000 mg | ORAL_CAPSULE | Freq: Every day | ORAL | 0 refills | Status: DC

## 2022-02-28 NOTE — Patient Instructions (Signed)
Derrick Charma Igo., it was a pleasure seeing you today! ? ?Today we discussed: ?- Take gabapentin '100mg'$  at night for your pain. Please make sure to take this when you are ready for bed, as this can make you sleepy. ? ?I have ordered the following labs today: ? ? ?Lab Orders    ?     BMP8+Anion Gap    ?     Hemoglobin A1c     ? ?I have ordered the following medication/changed the following medications:  ? ?Start the following medications: ?Meds ordered this encounter  ?Medications  ? gabapentin (NEURONTIN) 100 MG capsule  ?  Sig: Take 1 capsule (100 mg total) by mouth at bedtime.  ?  Dispense:  90 capsule  ?  Refill:  0  ?  ? ?Follow-up: 3 months  ? ?Please make sure to arrive 15 minutes prior to your next appointment. If you arrive late, you may be asked to reschedule.  ? ?We look forward to seeing you next time. Please call our clinic at [phone] if you have any questions or concerns. The best time to call is Monday-Friday from 9am-4pm, but there is someone available 24/7. If after hours or the weekend, call the main hospital number and ask for the Internal Medicine Resident On-Call. If you need medication refills, please notify your pharmacy one week in advance and they will send [place] a request. ? ?Thank you for letting us take part in your care. Wishing you the best! ? ?Thank you, ?[name], MD ? ?

## 2022-03-02 DIAGNOSIS — G629 Polyneuropathy, unspecified: Secondary | ICD-10-CM | POA: Insufficient documentation

## 2022-03-02 MED ORDER — AMLODIPINE BESYLATE 10 MG PO TABS: 10.0000 mg | ORAL_TABLET | Freq: Every day | ORAL | 1 refills | Status: DC

## 2022-03-02 NOTE — Assessment & Plan Note (Addendum)
Today on exam, patient has 2+ distal pulses bilateral lower extremities. His foot pain today is most consistent with neuropathy but does have some pain with ambulation. He does have a significant history of vascular disease with previous right SFA stent in 2018. Last ABI's were performed in 2021 with plans to follow-up this study in one year. At that time stent was patent. Will repeat this today and have Derrick Hendrix return to vascular surgery for follow-up. ? ?- Repeat ABI's ?- Vascular surgery referral ?

## 2022-03-02 NOTE — Progress Notes (Signed)
? ?CC: foot pain ? ?HPI: ? ?[name] is a 81 y.o. person with medical history as beloow presenting to [facility] for foot pain. ? ?Please see problem-based list for further details, assessments, and plans. ? ?Past Medical History:  ?Diagnosis Date  ? Abscess of foot without toes, left 08/28/2017  ? Chronic kidney disease (CKD), stage III (moderate) (HCC)   ? Constipation 11/07/2014  ? Daily headache   ? "lately" (04/17/2016)  ? Depression   ? Dizziness 03/07/2016  ? Headache 06/26/2014  ? Heart murmur   ? Hyperkalemia 10/05/2018  ? Hyperlipemia 11/11/2016  ? Hypertension   ? Neuropathic pain 02/06/2015  ? Osteomyelitis ([place])   ? PAD (peripheral artery disease) ([place])   ? Paronychia of great toe, right   ? Preop cardiovascular exam   ? Right foot pain 02/09/2017  ? Stable angina ([place]) 06/27/2014  ? Stroke [facility]) 03/2016  ? hx of PAD; j"ust lots of headaches since" (04/17/2016)  ? Vitamin B12 deficiency 06/27/2014  ? Weight loss, unintentional 06/27/2014  ? ?Review of Systems:  As per HPI ? ?Physical Exam: ? ?Vitals:  ? 02/28/22 0905  ?BP: (!) 166/92  ?Pulse: (!) 101  ?Temp: 97.7 ?F (36.5 ?C)  ?TempSrc: Oral  ?SpO2: 99%  ?Weight: 178 lb 11.2 oz (81.1 kg)  ? ?General: Resting comfortably in chair, no acute distress ?CV: Regular rate, rhythm. No murmurs appreciated. Distal pulses 2+ bilaterally. ?Pulm: Normal respiratory effort on room air. Clear to ausculation bilaterally. ?MSK: Normal bulk, tone. No pitting edema bilateral lower extremities.  ?Skin: Warm, dry. No rashes or lesions appreciated. ?Neuro: Awake, alert, conversing appropriately.  ?Psych: Normal mood, affect, speech. ? ?Assessment & Plan:  ? ?Peripheral neuropathy ?Derrick Hendrix is presenting today to discuss recent worsening of bilateral foot pain. Describes sharp, shooting pains in both of his feet especially in the evening times. Notes he has tried multiple creams to help with this, but nothing has helped. He describes majority of his symptoms are when he is at  rest, although does have some pain with ambulation. He denies fevers, night sweats, body aches, dyspnea, cough, abdominal pain, nausea, vomiting, changes in bowel movements or urination.  ? ?Patient was found to have decreased sensation on monofilament test. Symptoms most consistent with peripheral neuropathy. He does have long-standing diabetes associated with chronic renal disease stage V and vascular disease. Given his symptoms are mainly at night, will start with low dose gabapentin in the evening. Will repeat BMP to check renal function today as well.  ? ?- Gabapentin '100mg'$  QHS ?- Repeat BMP, A1c today ? ?Severe peripheral arterial disease ([place]) ?Today on exam, patient has 2+ distal pulses bilateral lower extremities. His foot pain today is most consistent with neuropathy but does have some pain with ambulation. He does have a significant history of vascular disease with previous right SFA stent in 2018. Last ABI's were performed in 2021 with plans to follow-up this study in one year. At that time stent was patent. Will repeat this today and have Derrick Hendrix return to vascular surgery for follow-up. ? ?- Repeat ABI's ?- Vascular surgery referral ? ?Essential hypertension ?BP Readings from Last 3 Encounters:  ?02/28/22 (!) 166/92  ?02/14/22 (!) 157/93  ?02/07/22 (!) 170/104  ? ?Blood pressure elevated, as patient does not take medications prior to coming to clinic. He uses public transportation and his medications make him urinate.  ? ?- Amlodipine '10mg'$  daily ?- Hydralazine '15mg'$  three times daily ?- Furosemide '40mg'$  daily ?-  Repeat BMP today ? ? ?Patient discussed with Dr.  Saverio Danker ? ?[name], MD ?Internal Medicine PGY-2 ?Pager: [phone] ? ?

## 2022-03-02 NOTE — Assessment & Plan Note (Signed)
BP Readings from Last 3 Encounters:  ?02/28/22 (!) 166/92  ?02/14/22 (!) 157/93  ?02/07/22 (!) 170/104  ? ?Blood pressure elevated, as patient does not take medications prior to coming to clinic. He uses public transportation and his medications make him urinate.  ? ?- Amlodipine '10mg'$  daily ?- Hydralazine '15mg'$  three times daily ?- Furosemide '40mg'$  daily ?- Repeat BMP today ?

## 2022-03-02 NOTE — Assessment & Plan Note (Addendum)
Mr. Allums is presenting today to discuss recent worsening of bilateral foot pain. Describes sharp, shooting pains in both of his feet especially in the evening times. Notes he has tried multiple creams to help with this, but nothing has helped. He describes majority of his symptoms are when he is at rest, although does have some pain with ambulation. He denies fevers, night sweats, body aches, dyspnea, cough, abdominal pain, nausea, vomiting, changes in bowel movements or urination.  ? ?Patient was found to have decreased sensation on monofilament test. Symptoms most consistent with peripheral neuropathy. He does have long-standing diabetes associated with chronic renal disease stage V and vascular disease. Given his symptoms are mainly at night, will start with low dose gabapentin in the evening. Will repeat BMP to check renal function today as well.  ? ?- Gabapentin '100mg'$  QHS ?- Repeat BMP, A1c today ?

## 2022-03-03 NOTE — Assessment & Plan Note (Signed)
Patient continues to be hyperkalemic, today at 5.9. He reports compliance with his Veltassa. I discussed with him today to double his dose and return for lab work. Patient was adamant that he did not want to take extra Veltassa or other medication due to side effects. Will continue with this and check BMP at next appointment. ? ?- BMP at next appointment ?

## 2022-03-03 NOTE — Progress Notes (Signed)
Internal Medicine Clinic Attending ? ?Case discussed with Dr. Braswell  At the time of the visit.  We reviewed the resident?s history and exam and pertinent patient test results.  I agree with the assessment, diagnosis, and plan of care documented in the resident?s note.  ?

## 2022-03-07 ENCOUNTER — Encounter (HOSPITAL_COMMUNITY): Payer: Medicare HMO

## 2022-03-14 ENCOUNTER — Encounter (HOSPITAL_COMMUNITY)
Admission: RE | Admit: 2022-03-14 | Discharge: 2022-03-14 | Disposition: A | Discharge status: Home / Self Care | Payer: Medicare HMO | Source: Ambulatory Visit | Attending: Nephrology | Admitting: Nephrology

## 2022-03-14 VITALS — BP 162/92 | HR 79 | Temp 97.4°F | Resp 18

## 2022-03-14 DIAGNOSIS — N185 Chronic kidney disease, stage 5: Secondary | ICD-10-CM

## 2022-03-14 LAB — IRON AND TIBC
Iron: 87 ug/dL (ref 45–182)
Saturation Ratios: 29 % (ref 17.9–39.5)
TIBC: 305 ug/dL (ref 250–450)
UIBC: 218 ug/dL

## 2022-03-14 LAB — FERRITIN: Ferritin: 283 ng/mL (ref 24–336)

## 2022-03-14 LAB — POCT HEMOGLOBIN-HEMACUE: Hemoglobin: 10.8 g/dL — ABNORMAL LOW (ref 13.0–17.0)

## 2022-03-14 MED ORDER — EPOETIN ALFA 10000 UNIT/ML IJ SOLN
INTRAMUSCULAR | Status: AC
  Filled 2022-03-14: qty 1

## 2022-03-14 MED ORDER — EPOETIN ALFA 10000 UNIT/ML IJ SOLN
10000.0000 [IU] | INTRAMUSCULAR | Status: DC
  Administered 2022-03-14: 10000 [IU] via SUBCUTANEOUS

## 2022-03-20 NOTE — Progress Notes (Signed)
? ?Cardiology Office Note ?Date:  03/20/2022  ?Patient ID:  Derrick Urwin., DOB May 23, 1941, MRN [id number] ?PCP:  [name], MD  ?Electrophysiologist: Dr. Rayann Heman ? ?Chief Complaint: 6 mo ? ?History of Present Illness: ?Derrick Hendrix. is a 81 y.o. male with history of CKD (V) tendency towards hyperkalemia, HTN, HLD stroke, AFib, PVD (R SFA intervention 2018), DM, peripheral neuropathy ? ?He comes in today to be seen for Dr. Rayann Heman, last seen by him Jan 2022, was doing well at that time without complaints. His loop was RRT and removed, noting prior to explant that his AF burden was quite low.  Continued on eliquis given stroke hx.   ? ?He saw his PMD 02/28/22 ?02/28/22: K+ 5.9 ?BUN/Creat 87/5.92 ?Addressed by his PMD, pt declined additional Veltassa ? ?DNR ? ?TODAY ?He is accompanied by his niece ?He did not take his medicines this AM, again 2/2 frequent/urgent urination after his pills. ?He does not monitor his BP at home, though will start ? ?He denies any CP, palpitations or cardiac awareness. ?No SOB, no syncope or syncope. ?No bleeding or signs of bleeding ?No symptoms of claudication ? ?He is pending new nephrologist, same office will be seeing Dr. Osborne Casco in May ?Sees his PMD, has labs next week. ? ?Get Epo injections regularly ?Goes again today ? ? ?AFib/AAD Hx ?Diagnosed 2019 ?No AAD to date ? ?Past Medical History:  ?Diagnosis Date  ? Abscess of foot without toes, left 08/28/2017  ? Chronic kidney disease (CKD), stage III (moderate) (HCC)   ? Constipation 11/07/2014  ? Daily headache   ? "lately" (04/17/2016)  ? Depression   ? Dizziness 03/07/2016  ? Headache 06/26/2014  ? Heart murmur   ? Hyperkalemia 10/05/2018  ? Hyperlipemia 11/11/2016  ? Hypertension   ? Neuropathic pain 02/06/2015  ? Osteomyelitis ([place])   ? PAD (peripheral artery disease) ([place])   ? Paronychia of great toe, right   ? Preop cardiovascular exam   ? Right foot pain 02/09/2017  ? Stable angina ([place]) 06/27/2014  ? Stroke [facility]) 03/2016  ?  hx of PAD; j"ust lots of headaches since" (04/17/2016)  ? Vitamin B12 deficiency 06/27/2014  ? Weight loss, unintentional 06/27/2014  ? ? ?Past Surgical History:  ?Procedure Laterality Date  ? ABDOMINAL AORTOGRAM W/LOWER EXTREMITY N/A 03/02/2017  ? Procedure: Abdominal Aortogram w/Lower Extremity;  Surgeon: Conrad Guymon, MD;  Location: [place] [facility];  Service: Cardiovascular;  Laterality: N/A;  ? EP IMPLANTABLE DEVICE N/A 03/04/2016  ? Procedure: Loop Recorder Insertion;  Surgeon: Thompson Grayer, MD;  Location: [place] [facility];  Service: Cardiovascular;  Laterality: N/A;  ? implantable loop recorder removal  12/19/2020  ? MDT Reveal LINQ removed by Dr  Rayann Heman  ? PERIPHERAL VASCULAR INTERVENTION Right 03/06/2017  ? Procedure: Peripheral Vascular Intervention;  Surgeon: Elam Dutch, MD;  Location: [place] [facility];  Service: Cardiovascular;  Laterality: Right;  SFA  ? ? ?Current Outpatient Medications  ?Medication Sig Dispense Refill  ? amLODipine (NORVASC) 10 MG tablet Take 1 tablet (10 mg total) by mouth daily. 90 tablet 1  ? apixaban (ELIQUIS) 2.5 MG TABS tablet TAKE ONE TABLET BY MOUTH TWICE A DAY 180 tablet 0  ? atorvastatin (LIPITOR) 80 MG tablet TAKE ONE TABLET BY MOUTH DAILY 90 tablet 4  ? Blood Pressure Monitoring (BLOOD PRESSURE KIT) DEVI Please check blood pressure twice a day. In the morning and evening. Please record numbers on a sheet of paper. Blood pressure parameters are  120-150/80-90 1 each 0  ? calcitRIOL (ROCALTROL) 0.25 MCG capsule Take 0.25 mcg by mouth daily.    ? clobetasol cream (TEMOVATE) 0.05 % APPLY TOPICALLY TWO (TWO) TIMES DAILY. (Patient taking differently: Apply 1 application topically 2 (two) times daily.) 60 g 0  ? docusate sodium (COLACE) 100 MG capsule Take 1 capsule (100 mg total) by mouth daily. 90 capsule 3  ? finasteride (PROSCAR) 5 MG tablet TAKE ONE TABLET BY MOUTH DAILY 90 tablet 4  ? furosemide (LASIX) 40 MG tablet TAKE ONE TABLET BY MOUTH TWICE A DAY 90 tablet 4  ?  gabapentin (NEURONTIN) 100 MG capsule Take 1 capsule (100 mg total) by mouth at bedtime. 90 capsule 0  ? hydrALAZINE (APRESOLINE) 10 MG tablet TAKE 1.5 TABLETS BY MOUTH THREE TIMES A DAY 135 tablet 3  ? nitroGLYCERIN (NITROSTAT) 0.4 MG SL tablet Place 1 tablet (0.4 mg total) under the tongue every 5 (five) minutes x 3 doses as needed for chest pain. 30 tablet 3  ? omeprazole (PRILOSEC) 40 MG capsule TAKE ONE CAPSULE SHORTLY BEFORE BREAKFAST EACH MORNING 30 capsule 10  ? predniSONE (DELTASONE) 10 MG tablet Take 10 mg by mouth daily.     ? sodium bicarbonate 650 MG tablet TAKE TWO TABLETS BY MOUTH TWICE A DAY 120 tablet 3  ? tamsulosin (FLOMAX) 0.4 MG CAPS capsule TAKE TWO CAPSULES BY MOUTH DAILY 180 capsule 4  ? VELTASSA 8.4 g packet Take 1 packet by mouth daily.    ? ?No current facility-administered medications for this visit.  ? ? ?Allergies:   Aspirin  ? ?Social History:  The patient  reports that he has quit smoking. His smoking use included cigarettes. He started smoking about 4 years ago. He has never used smokeless tobacco. He reports that he does not drink alcohol and does not use drugs.  ? ?Family History:  The patient's family history includes Diabetes in his mother; Hypertension in his mother. ? ?ROS:  Please see the history of present illness.    ?All other systems are reviewed and otherwise negative.  ? ?PHYSICAL EXAM:  ?VS:  There were no vitals taken for this visit. BMI: There is no height or weight on file to calculate BMI. ?Well nourished, well developed, in no acute distress ?HEENT: normocephalic, atraumatic ?Neck: no JVD, carotid bruits or masses ?Cardiac:  RRR; no significant murmurs, no rubs, or gallops ?Lungs:  CTA b/l, no wheezing, rhonchi or rales ?Abd: soft, nontender ?MS: no deformity or atrophy ?Ext: no edema ?Skin: warm and dry, no rash ?Neuro:  No gross deficits appreciated ?Psych: euthymic mood, full affect ? ? ?EKG:  Done today and reviewed by myself shows  ?SR 86bpm ? ? ?03/04/2017:  TTE ?Study Conclusions  ?- Left ventricle: The cavity size was normal. Systolic function was  ?  vigorous. The estimated ejection fraction was in the range of 65%  ?  to 70%. Wall motion was normal; there were no regional wall  ?  motion abnormalities. Doppler parameters are consistent with  ?  abnormal left ventricular relaxation (grade 1 diastolic  ?  dysfunction).  ?- Aortic valve: There was no stenosis.  ?- Mitral valve: There was trivial regurgitation.  ?- Right ventricle: The cavity size was normal. Systolic function  ?  was normal.  ?- Tricuspid valve: Peak RV-RA gradient (S): 36 mm Hg.  ?- Pulmonary arteries: PA peak pressure: 39 mm Hg (S).  ?- Inferior vena cava: The vessel was normal in size. The  ?  respirophasic diameter changes were in the normal range (>= 50%),  ?  consistent with normal central venous pressure.  ? ?Impressions:  ?- Normal LV size with EF 65-70%. Normal RV size and systolic  ?  function. No significant valvular abnormalities. Mild pulmonary  ?  hypertension.  ? ? ?Recent Labs: ?04/29/2021: Platelets 262 ?02/28/2022: BUN 87; Creatinine, Ser 5.92; Potassium 5.9; Sodium 142 ?03/14/2022: Hemoglobin 10.8  ?No results found for requested labs within last 8760 hours.  ? ?CrCl cannot be calculated (Unknown ideal weight.).  ? ?Wt Readings from Last 3 Encounters:  ?02/28/22 178 lb 11.2 oz (81.1 kg)  ?05/27/21 192 lb 8 oz (87.3 kg)  ?04/29/21 193 lb 3.2 oz (87.6 kg)  ?  ? ?Other studies reviewed: ?Additional studies/records reviewed today include: summarized above ? ?ASSESSMENT AND PLAN: ? ?Paroxysmal AFib ?CHA2DS2Vasc is 7, on Eliquis, appropriately dosed at 2.5 for age/Creat ?Minimal if any burden by symptoms ? ? ?HTN ?A recheck by myself is 168/88 ?He will take his meds once home and start to check his BP at home ? ?Disposition: F/u with [place] annually, sooner if needed ? ?Current medicines are reviewed at length with the patient today.  The patient did not have any concerns regarding  medicines. ? ?Signed, ?Tommye Standard, PA-C ?03/20/2022 9:34 AM    ? ?[name] ?[address] ?[address] ?[place] [address] ?[phone] (office)  ?[phone] (fax) ? ? ?

## 2022-03-21 ENCOUNTER — Encounter (HOSPITAL_COMMUNITY)
Admission: RE | Admit: 2022-03-21 | Discharge: 2022-03-21 | Disposition: A | Discharge status: Home / Self Care | Payer: Medicare HMO | Source: Ambulatory Visit | Attending: Nephrology | Admitting: Nephrology

## 2022-03-21 ENCOUNTER — Encounter: Payer: Self-pay | Admitting: Physician Assistant

## 2022-03-21 ENCOUNTER — Encounter (HOSPITAL_COMMUNITY): Payer: Medicare HMO

## 2022-03-21 ENCOUNTER — Ambulatory Visit (INDEPENDENT_AMBULATORY_CARE_PROVIDER_SITE_OTHER): Payer: Medicare HMO | Admitting: Physician Assistant

## 2022-03-21 VITALS — BP 171/90 | HR 85 | Temp 98.0°F | Resp 18

## 2022-03-21 VITALS — BP 184/76 | HR 86 | Ht 74.0 in | Wt 177.0 lb

## 2022-03-21 DIAGNOSIS — N185 Chronic kidney disease, stage 5: Secondary | ICD-10-CM

## 2022-03-21 DIAGNOSIS — I480 Paroxysmal atrial fibrillation: Secondary | ICD-10-CM | POA: Diagnosis not present

## 2022-03-21 DIAGNOSIS — I10 Essential (primary) hypertension: Secondary | ICD-10-CM | POA: Diagnosis not present

## 2022-03-21 MED ORDER — EPOETIN ALFA 10000 UNIT/ML IJ SOLN: 10000.0000 [IU] | INTRAMUSCULAR | Status: DC

## 2022-03-21 NOTE — Patient Instructions (Signed)
Medication Instructions:  ? ?Your physician recommends that you continue on your current medications as directed. Please refer to the Current Medication list given to you today. ? ?*If you need a refill on your cardiac medications before your next appointment, please call your pharmacy* ? ? ?Lab Work: [place] ? ? ?If you have labs (blood work) drawn today and your tests are completely normal, you will receive your results only by: ?MyChart Message (if you have MyChart) OR ?A paper copy in the mail ?If you have any lab test that is abnormal or we need to change your treatment, we will call you to review the results. ? ? ?Testing/Procedures: NONE ORDERED  TODAY ? ? ? ? ?Follow-Up: ?At [facility], you and your health needs are our priority.  As part of our continuing mission to provide you with exceptional heart care, we have created designated Provider Care Teams.  These Care Teams include your primary Cardiologist (physician) and Advanced Practice Providers (APPs -  Physician Assistants and Nurse Practitioners) who all work together to provide you with the care you need, when you need it. ? ?We recommend signing up for the patient portal called "MyChart".  Sign up information is provided on this After Visit Summary.  MyChart is used to connect with patients for Virtual Visits (Telemedicine).  Patients are able to view lab/test results, encounter notes, upcoming appointments, etc.  Non-urgent messages can be sent to your provider as well.   ?To learn more about what you can do with MyChart, go to [email or web address].   ? ?Your next appointment:   ?1 year(s) ? ?The format for your next appointment:   ?In Person ? ?Provider:   ?You may see Thompson Grayer, MD or one of the following Advanced Practice Providers on your designated Care Team:   ?Tommye Standard, PA-C ?1}  ? ? ?Other Instructions ? ?

## 2022-03-25 ENCOUNTER — Ambulatory Visit (INDEPENDENT_AMBULATORY_CARE_PROVIDER_SITE_OTHER): Payer: Medicare HMO | Admitting: Internal Medicine

## 2022-03-25 VITALS — BP 155/78 | HR 96 | Temp 98.2°F | Ht 74.0 in | Wt 174.9 lb

## 2022-03-25 DIAGNOSIS — E875 Hyperkalemia: Secondary | ICD-10-CM

## 2022-03-25 LAB — BASIC METABOLIC PANEL
Anion gap: 10 (ref 5–15)
BUN: 94 mg/dL — ABNORMAL HIGH (ref 8–23)
CO2: 21 mmol/L — ABNORMAL LOW (ref 22–32)
Calcium: 9 mg/dL (ref 8.9–10.3)
Chloride: 109 mmol/L (ref 98–111)
Creatinine, Ser: 5.84 mg/dL — ABNORMAL HIGH (ref 0.61–1.24)
GFR, Estimated: 9 mL/min — ABNORMAL LOW (ref >60)
Glucose, Bld: 94 mg/dL (ref 70–99)
Potassium: 6.7 mmol/L (ref 3.5–5.1)
Sodium: 140 mmol/L (ref 135–145)

## 2022-03-25 NOTE — Assessment & Plan Note (Addendum)
Patient presents today for a follow up for hyperkalemia. He was recently increased on valtessa. States he is tolerating it well. He does endorse chronic constipation and need to periodically take a laxative to help with bowel movements. States that he is still take lasix, but did not take it this morning as it makes him urinate too frequently while out for appts. Patient denies any significant symptoms of hyperkalemia. 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  ? ?Plan: ?- Repeat BMP today. ? ? ?**ADDENDUM** ? ?K of 6.7. Patient was called and informed to go to the ED for further evaluation and management of his hyperkalemia.  ?

## 2022-03-25 NOTE — Progress Notes (Signed)
? ? ?Subjective:  ?CC: Hyperkalemia ? ?HPI: ? ?[name]. is a 81 y.o. male with a past medical history stated below and presents today for hyperkalemia. Please see problem based assessment and plan for additional details. ? ?Past Medical History:  ?Diagnosis Date  ? Abscess of foot without toes, left 08/28/2017  ? Chronic kidney disease (CKD), stage III (moderate) (HCC)   ? Constipation 11/07/2014  ? Daily headache   ? "lately" (04/17/2016)  ? Depression   ? Dizziness 03/07/2016  ? Headache 06/26/2014  ? Heart murmur   ? Hyperkalemia 10/05/2018  ? Hyperlipemia 11/11/2016  ? Hypertension   ? Neuropathic pain 02/06/2015  ? Osteomyelitis ([place])   ? PAD (peripheral artery disease) ([place])   ? Paronychia of great toe, right   ? Preop cardiovascular exam   ? Right foot pain 02/09/2017  ? Stable angina ([place]) 06/27/2014  ? Stroke [facility]) 03/2016  ? hx of PAD; j"ust lots of headaches since" (04/17/2016)  ? Vitamin B12 deficiency 06/27/2014  ? Weight loss, unintentional 06/27/2014  ? ? ?Current Outpatient Medications on File Prior to Visit  ?Medication Sig Dispense Refill  ? amLODipine (NORVASC) 10 MG tablet Take 1 tablet (10 mg total) by mouth daily. 90 tablet 1  ? apixaban (ELIQUIS) 2.5 MG TABS tablet TAKE ONE TABLET BY MOUTH TWICE A DAY 180 tablet 0  ? atorvastatin (LIPITOR) 80 MG tablet TAKE ONE TABLET BY MOUTH DAILY 90 tablet 4  ? Blood Pressure Monitoring (BLOOD PRESSURE KIT) DEVI Please check blood pressure twice a day. In the morning and evening. Please record numbers on a sheet of paper. Blood pressure parameters are 120-150/80-90 1 each 0  ? calcitRIOL (ROCALTROL) 0.25 MCG capsule Take 0.25 mcg by mouth daily.    ? clobetasol cream (TEMOVATE) 0.05 % APPLY TOPICALLY TWO (TWO) TIMES DAILY. (Patient taking differently: Apply 1 application. topically 2 (two) times daily.) 60 g 0  ? docusate sodium (COLACE) 100 MG capsule Take 1 capsule (100 mg total) by mouth daily. 90 capsule 3  ? finasteride (PROSCAR) 5 MG tablet TAKE ONE  TABLET BY MOUTH DAILY 90 tablet 4  ? furosemide (LASIX) 40 MG tablet TAKE ONE TABLET BY MOUTH TWICE A DAY 90 tablet 4  ? gabapentin (NEURONTIN) 100 MG capsule Take 1 capsule (100 mg total) by mouth at bedtime. 90 capsule 0  ? hydrALAZINE (APRESOLINE) 10 MG tablet TAKE 1.5 TABLETS BY MOUTH THREE TIMES A DAY 135 tablet 3  ? nitroGLYCERIN (NITROSTAT) 0.4 MG SL tablet Place 1 tablet (0.4 mg total) under the tongue every 5 (five) minutes x 3 doses as needed for chest pain. 30 tablet 3  ? omeprazole (PRILOSEC) 40 MG capsule TAKE ONE CAPSULE SHORTLY BEFORE BREAKFAST EACH MORNING 30 capsule 10  ? predniSONE (DELTASONE) 10 MG tablet Take 10 mg by mouth daily.     ? sodium bicarbonate 650 MG tablet TAKE TWO TABLETS BY MOUTH TWICE A DAY 120 tablet 3  ? tamsulosin (FLOMAX) 0.4 MG CAPS capsule TAKE TWO CAPSULES BY MOUTH DAILY 180 capsule 4  ? VELTASSA 8.4 g packet Take 1 packet by mouth daily.    ? ?No current facility-administered medications on file prior to visit.  ? ? ?Family History  ?Problem Relation Age of Onset  ? Hypertension Mother   ?     died age 30  ? Diabetes Mother   ? ? ?Social History  ? ?Socioeconomic History  ? Marital status: Single  ?  Spouse name: Not on  file  ? Number of children: Not on file  ? Years of education: Not on file  ? Highest education level: Not on file  ?Occupational History  ? Not on file  ?Tobacco Use  ? Smoking status: Former  ?  Years: 66.00  ?  Types: Cigarettes  ?  Start date: 06/23/2017  ? Smokeless tobacco: Never  ?Vaping Use  ? Vaping Use: Never used  ?Substance and Sexual Activity  ? Alcohol use: No  ?  Alcohol/week: 0.0 standard drinks  ?  Comment: 04/17/2016 "nothing in the last 3-4 years"  ? Drug use: No  ? Sexual activity: Never  ?Other Topics Concern  ? Not on file  ?Social History Narrative  ? Works in the yard  ?   ? ?Social Determinants of Health  ? ?Financial Resource Strain: Not on file  ?Food Insecurity: No Food Insecurity  ? Worried About Charity fundraiser in the Last  Year: Never true  ? Ran Out of Food in the Last Year: Never true  ?Transportation Needs: No Transportation Needs  ? Lack of Transportation (Medical): No  ? Lack of Transportation (Non-Medical): No  ?Physical Activity: Not on file  ?Stress: Not on file  ?Social Connections: Unknown  ? Frequency of Communication with Friends and Family: More than three times a week  ? Frequency of Social Gatherings with Friends and Family: More than three times a week  ? Attends Religious Services: Not on file  ? Active Member of Clubs or Organizations: Not on file  ? Attends Archivist Meetings: Not on file  ? Marital Status: Not on file  ?Intimate Partner Violence: Not on file  ? ? ?Review of Systems: ?ROS negative except for what is noted on the assessment and plan. ? ?Objective:  ? ?Vitals:  ? 03/25/22 0858 03/25/22 0920  ?BP: (!) 173/90 (!) 155/78  ?Pulse: 96 96  ?Temp: 98.2 ?F (36.8 ?C)   ?TempSrc: Oral   ?SpO2: 100%   ?Weight: 174 lb 14.4 oz (79.3 kg)   ?Height: $RemoveB'6\' 2"'[id number]$  (1.88 m)   ? ? ?Physical Exam: ?Gen: A&O x3 and in no apparent distress, well appearing and nourished. ?Neck: no masses or nodules, AROM intact. ?CV: RRR, no murmurs, S1/S2 presents  ?Resp: Clear to ascultation bilaterally  ?Abd: BS (+) x4, soft, non-tender abdomen, without hepatosplenomegaly or masses ?MSK: Grossly normal AROM and strength x4 extremities. ?Skin: good skin turgor, no rashes, unusual bruising, or prominent lesions.  ?Neuro: No focal deficits, grossly normal sensation and coordination. He is at his baseline ?Psych: Oriented x3 and responding appropriately. Intact memory, normal mood, judgement, affect, and insight.  ? ? ?Assessment & Plan:  ?See Encounters Tab for problem based charting. ? ?Patient discussed with Dr.  Cain Sieve ? ? ?Marianna Payment, D.O. ?[facility]  PGY-3 ?Pager: [phone]  Phone: [phone] ?Date 03/25/2022  Time 9:44 AM ? ?

## 2022-03-25 NOTE — Patient Instructions (Signed)
Thank you, [name]. for allowing [place] to provide your care today. Today we discussed potassium and blood pressure.   ? ?Labs/Tests Ordered: ? ?Lab Orders    ?     BMP8+Anion Gap     ? ?Referrals Ordered:  ?Referral Orders  ?No referral(s) requested today  ?  ? ?Medication Changes:  ?There are no discontinued medications.  ? ?No orders of the defined types were placed in this encounter. ?  ? ?Health Maintenance Screening: ?Diabetes Health Maintenance Due  ?Topic Date Due  ? OPHTHALMOLOGY EXAM  Never done  ? HEMOGLOBIN A1C  05/31/2022  ? FOOT EXAM  03/01/2023  ?  ? ?Instructions: I will talk to your case manager about getting a nurse to come check your blood pressure at home.  ? ?Follow up:  1 month   ? ?Remember: If you have any questions or concerns, call our clinic at [phone] or after hours call [phone] and ask for the internal medicine resident on call. ? ?Marianna Payment, D.O. ?Derrick Hendrix ? ? ? ?

## 2022-03-26 ENCOUNTER — Inpatient Hospital Stay (HOSPITAL_COMMUNITY)
Admission: EM | Admit: 2022-03-26 | Discharge: 2022-04-02 | DRG: 872 | Disposition: A | Discharge status: Skilled Nursing Facility | Payer: Medicare HMO | Attending: Infectious Diseases | Admitting: Infectious Diseases

## 2022-03-26 ENCOUNTER — Telehealth: Payer: Self-pay | Admitting: Internal Medicine

## 2022-03-26 ENCOUNTER — Encounter (HOSPITAL_COMMUNITY): Payer: Self-pay

## 2022-03-26 ENCOUNTER — Other Ambulatory Visit: Payer: Self-pay

## 2022-03-26 DIAGNOSIS — E7800 Pure hypercholesterolemia, unspecified: Secondary | ICD-10-CM | POA: Diagnosis present

## 2022-03-26 DIAGNOSIS — I959 Hypotension, unspecified: Secondary | ICD-10-CM | POA: Diagnosis not present

## 2022-03-26 DIAGNOSIS — N400 Enlarged prostate without lower urinary tract symptoms: Secondary | ICD-10-CM | POA: Diagnosis present

## 2022-03-26 DIAGNOSIS — E1142 Type 2 diabetes mellitus with diabetic polyneuropathy: Secondary | ICD-10-CM | POA: Diagnosis present

## 2022-03-26 DIAGNOSIS — D631 Anemia in chronic kidney disease: Secondary | ICD-10-CM | POA: Diagnosis present

## 2022-03-26 DIAGNOSIS — E1151 Type 2 diabetes mellitus with diabetic peripheral angiopathy without gangrene: Secondary | ICD-10-CM | POA: Diagnosis present

## 2022-03-26 DIAGNOSIS — N390 Urinary tract infection, site not specified: Secondary | ICD-10-CM | POA: Diagnosis present

## 2022-03-26 DIAGNOSIS — Z515 Encounter for palliative care: Secondary | ICD-10-CM

## 2022-03-26 DIAGNOSIS — Z20822 Contact with and (suspected) exposure to covid-19: Secondary | ICD-10-CM | POA: Diagnosis present

## 2022-03-26 DIAGNOSIS — E875 Hyperkalemia: Secondary | ICD-10-CM

## 2022-03-26 DIAGNOSIS — S025XXA Fracture of tooth (traumatic), initial encounter for closed fracture: Secondary | ICD-10-CM | POA: Diagnosis present

## 2022-03-26 DIAGNOSIS — D539 Nutritional anemia, unspecified: Secondary | ICD-10-CM | POA: Diagnosis present

## 2022-03-26 DIAGNOSIS — R9431 Abnormal electrocardiogram [ECG] [EKG]: Secondary | ICD-10-CM | POA: Diagnosis not present

## 2022-03-26 DIAGNOSIS — A4151 Sepsis due to Escherichia coli [E. coli]: Principal | ICD-10-CM | POA: Diagnosis present

## 2022-03-26 DIAGNOSIS — I69398 Other sequelae of cerebral infarction: Secondary | ICD-10-CM

## 2022-03-26 DIAGNOSIS — Z7952 Long term (current) use of systemic steroids: Secondary | ICD-10-CM

## 2022-03-26 DIAGNOSIS — E1122 Type 2 diabetes mellitus with diabetic chronic kidney disease: Secondary | ICD-10-CM | POA: Diagnosis present

## 2022-03-26 DIAGNOSIS — G255 Other chorea: Secondary | ICD-10-CM | POA: Diagnosis present

## 2022-03-26 DIAGNOSIS — A419 Sepsis, unspecified organism: Secondary | ICD-10-CM

## 2022-03-26 DIAGNOSIS — I480 Paroxysmal atrial fibrillation: Secondary | ICD-10-CM | POA: Diagnosis present

## 2022-03-26 DIAGNOSIS — I69354 Hemiplegia and hemiparesis following cerebral infarction affecting left non-dominant side: Secondary | ICD-10-CM

## 2022-03-26 DIAGNOSIS — Z833 Family history of diabetes mellitus: Secondary | ICD-10-CM

## 2022-03-26 DIAGNOSIS — Z7901 Long term (current) use of anticoagulants: Secondary | ICD-10-CM

## 2022-03-26 DIAGNOSIS — Z8249 Family history of ischemic heart disease and other diseases of the circulatory system: Secondary | ICD-10-CM

## 2022-03-26 DIAGNOSIS — Z66 Do not resuscitate: Secondary | ICD-10-CM | POA: Diagnosis present

## 2022-03-26 DIAGNOSIS — N179 Acute kidney failure, unspecified: Secondary | ICD-10-CM | POA: Diagnosis present

## 2022-03-26 DIAGNOSIS — I120 Hypertensive chronic kidney disease with stage 5 chronic kidney disease or end stage renal disease: Secondary | ICD-10-CM | POA: Diagnosis present

## 2022-03-26 DIAGNOSIS — M272 Inflammatory conditions of jaws: Secondary | ICD-10-CM | POA: Diagnosis present

## 2022-03-26 DIAGNOSIS — E872 Acidosis, unspecified: Secondary | ICD-10-CM | POA: Diagnosis present

## 2022-03-26 DIAGNOSIS — Z886 Allergy status to analgesic agent status: Secondary | ICD-10-CM

## 2022-03-26 DIAGNOSIS — M109 Gout, unspecified: Secondary | ICD-10-CM | POA: Diagnosis present

## 2022-03-26 DIAGNOSIS — Z91148 Patient's other noncompliance with medication regimen for other reason: Secondary | ICD-10-CM

## 2022-03-26 DIAGNOSIS — Z87891 Personal history of nicotine dependence: Secondary | ICD-10-CM

## 2022-03-26 DIAGNOSIS — N185 Chronic kidney disease, stage 5: Secondary | ICD-10-CM | POA: Diagnosis present

## 2022-03-26 DIAGNOSIS — D72810 Lymphocytopenia: Secondary | ICD-10-CM | POA: Diagnosis present

## 2022-03-26 DIAGNOSIS — Z79899 Other long term (current) drug therapy: Secondary | ICD-10-CM

## 2022-03-26 LAB — BASIC METABOLIC PANEL
Anion gap: 10 (ref 5–15)
BUN: 93 mg/dL — ABNORMAL HIGH (ref 8–23)
CO2: 20 mmol/L — ABNORMAL LOW (ref 22–32)
Calcium: 8.3 mg/dL — ABNORMAL LOW (ref 8.9–10.3)
Chloride: 109 mmol/L (ref 98–111)
Creatinine, Ser: 6.01 mg/dL — ABNORMAL HIGH (ref 0.61–1.24)
GFR, Estimated: 9 mL/min — ABNORMAL LOW (ref >60)
Glucose, Bld: 118 mg/dL — ABNORMAL HIGH (ref 70–99)
Potassium: 6.7 mmol/L (ref 3.5–5.1)
Sodium: 139 mmol/L (ref 135–145)

## 2022-03-26 LAB — CBC WITH DIFFERENTIAL/PLATELET
Abs Immature Granulocytes: 0.03 10*3/uL (ref 0.00–0.07)
Basophils Absolute: 0.1 10*3/uL (ref 0.0–0.1)
Basophils Relative: 1 %
Eosinophils Absolute: 0 10*3/uL (ref 0.0–0.5)
Eosinophils Relative: 1 %
HCT: 35 % — ABNORMAL LOW (ref 39.0–52.0)
Hemoglobin: 10.6 g/dL — ABNORMAL LOW (ref 13.0–17.0)
Immature Granulocytes: 1 %
Lymphocytes Relative: 14 %
Lymphs Abs: 1 10*3/uL (ref 0.7–4.0)
MCH: 30.8 pg (ref 26.0–34.0)
MCHC: 30.3 g/dL (ref 30.0–36.0)
MCV: 101.7 fL — ABNORMAL HIGH (ref 80.0–100.0)
Monocytes Absolute: 0.4 10*3/uL (ref 0.1–1.0)
Monocytes Relative: 6 %
Neutro Abs: 5.1 10*3/uL (ref 1.7–7.7)
Neutrophils Relative %: 77 %
Platelets: 289 10*3/uL (ref 150–400)
RBC: 3.44 MIL/uL — ABNORMAL LOW (ref 4.22–5.81)
RDW: 15.9 % — ABNORMAL HIGH (ref 11.5–15.5)
WBC: 6.6 10*3/uL (ref 4.0–10.5)
nRBC: 0 % (ref 0.0–0.2)

## 2022-03-26 LAB — MAGNESIUM: Magnesium: 2.7 mg/dL — ABNORMAL HIGH (ref 1.7–2.4)

## 2022-03-26 LAB — TROPONIN I (HIGH SENSITIVITY)
Troponin I (High Sensitivity): 18 ng/L — ABNORMAL HIGH (ref <18)
Troponin I (High Sensitivity): 17 ng/L (ref <18)

## 2022-03-26 LAB — POTASSIUM: Potassium: 5.8 mmol/L — ABNORMAL HIGH (ref 3.5–5.1)

## 2022-03-26 LAB — CBG MONITORING, ED: Glucose-Capillary: 103 mg/dL — ABNORMAL HIGH (ref 70–99)

## 2022-03-26 MED ORDER — SODIUM BICARBONATE 8.4 % IV SOLN: Freq: Once | INTRAVENOUS | Status: DC

## 2022-03-26 MED ORDER — FUROSEMIDE 10 MG/ML IJ SOLN
40.0000 mg | Freq: Once | INTRAMUSCULAR | Status: DC
Start: 2022-03-26 — End: 2022-03-26

## 2022-03-26 MED ORDER — INSULIN ASPART 100 UNIT/ML IV SOLN: 10.0000 [IU] | Freq: Once | INTRAVENOUS | Status: DC

## 2022-03-26 MED ORDER — FUROSEMIDE 10 MG/ML IJ SOLN
20.0000 mg | Freq: Once | INTRAMUSCULAR | Status: AC
Start: 2022-03-26 — End: 2022-03-26
  Administered 2022-03-26: 20 mg via INTRAVENOUS
  Filled 2022-03-26: qty 2

## 2022-03-26 MED ORDER — DEXTROSE 50 % IV SOLN
1.0000 | Freq: Once | INTRAVENOUS | Status: AC
  Administered 2022-03-26: 50 mL via INTRAVENOUS
  Filled 2022-03-26: qty 50

## 2022-03-26 MED ORDER — ACETAMINOPHEN 325 MG PO TABS
650.0000 mg | ORAL_TABLET | Freq: Four times a day (QID) | ORAL | Status: DC | PRN
Start: 2022-03-26 — End: 2022-04-03
  Administered 2022-03-27 – 2022-03-31 (×4): 650 mg via ORAL
  Filled 2022-03-26 (×4): qty 2

## 2022-03-26 MED ORDER — CALCIUM GLUCONATE 10 % IV SOLN
1.0000 g | Freq: Once | INTRAVENOUS | Status: AC
  Administered 2022-03-26: 1 g via INTRAVENOUS
  Filled 2022-03-26: qty 10

## 2022-03-26 MED ORDER — ALBUTEROL SULFATE (2.5 MG/3ML) 0.083% IN NEBU
10.0000 mg | INHALATION_SOLUTION | Freq: Once | RESPIRATORY_TRACT | Status: AC
  Administered 2022-03-26: 10 mg via RESPIRATORY_TRACT
  Filled 2022-03-26: qty 12

## 2022-03-26 MED ORDER — TAMSULOSIN HCL 0.4 MG PO CAPS
0.8000 mg | ORAL_CAPSULE | Freq: Every day | ORAL | Status: DC
Start: 2022-03-27 — End: 2022-04-03
  Administered 2022-03-27 – 2022-04-02 (×7): 0.8 mg via ORAL
  Filled 2022-03-26 (×7): qty 2

## 2022-03-26 MED ORDER — ACETAMINOPHEN 650 MG RE SUPP: 650.0000 mg | Freq: Four times a day (QID) | RECTAL | Status: DC | PRN

## 2022-03-26 MED ORDER — CALCIUM GLUCONATE-NACL 1-0.675 GM/50ML-% IV SOLN
1.0000 g | Freq: Once | INTRAVENOUS | Status: DC
Start: 2022-03-26 — End: 2022-03-26

## 2022-03-26 MED ORDER — HEPARIN SODIUM (PORCINE) 5000 UNIT/ML IJ SOLN: 5000.0000 [IU] | Freq: Three times a day (TID) | INTRAMUSCULAR | Status: DC

## 2022-03-26 MED ORDER — INSULIN ASPART 100 UNIT/ML IV SOLN
5.0000 [IU] | Freq: Once | INTRAVENOUS | Status: AC
  Administered 2022-03-26: 5 [IU] via INTRAVENOUS

## 2022-03-26 MED ORDER — CALCITRIOL 0.25 MCG PO CAPS
0.2500 ug | ORAL_CAPSULE | Freq: Every day | ORAL | Status: DC
  Administered 2022-03-27 – 2022-04-01 (×6): 0.25 ug via ORAL
  Filled 2022-03-26 (×6): qty 1

## 2022-03-26 MED ORDER — HYDRALAZINE HCL 10 MG PO TABS
15.0000 mg | ORAL_TABLET | Freq: Three times a day (TID) | ORAL | Status: DC
  Administered 2022-03-27 (×4): 15 mg via ORAL
  Filled 2022-03-26 (×5): qty 2

## 2022-03-26 MED ORDER — PANTOPRAZOLE SODIUM 40 MG PO TBEC
40.0000 mg | DELAYED_RELEASE_TABLET | Freq: Every day | ORAL | Status: DC
  Administered 2022-03-27 – 2022-03-31 (×5): 40 mg via ORAL
  Filled 2022-03-26 (×5): qty 1

## 2022-03-26 MED ORDER — ATORVASTATIN CALCIUM 80 MG PO TABS
80.0000 mg | ORAL_TABLET | Freq: Every day | ORAL | Status: DC
  Administered 2022-03-27 – 2022-03-31 (×5): 80 mg via ORAL
  Filled 2022-03-26 (×3): qty 1
  Filled 2022-03-26: qty 2
  Filled 2022-03-26: qty 1

## 2022-03-26 MED ORDER — SODIUM ZIRCONIUM CYCLOSILICATE 10 G PO PACK: 10.0000 g | PACK | Freq: Once | ORAL | Status: DC

## 2022-03-26 MED ORDER — FINASTERIDE 5 MG PO TABS
5.0000 mg | ORAL_TABLET | Freq: Every day | ORAL | Status: DC
Start: 2022-03-27 — End: 2022-04-03
  Administered 2022-03-27 – 2022-04-02 (×7): 5 mg via ORAL
  Filled 2022-03-26 (×7): qty 1

## 2022-03-26 MED ORDER — SODIUM BICARBONATE 650 MG PO TABS
1300.0000 mg | ORAL_TABLET | Freq: Two times a day (BID) | ORAL | Status: DC
  Administered 2022-03-27 (×2): 1300 mg via ORAL
  Filled 2022-03-26 (×2): qty 2

## 2022-03-26 MED ORDER — SODIUM ZIRCONIUM CYCLOSILICATE 10 G PO PACK
10.0000 g | PACK | Freq: Three times a day (TID) | ORAL | Status: DC
  Administered 2022-03-27: 10 g via ORAL
  Filled 2022-03-26: qty 1

## 2022-03-26 MED ORDER — SODIUM ZIRCONIUM CYCLOSILICATE 10 G PO PACK
10.0000 g | PACK | Freq: Once | ORAL | Status: AC
  Administered 2022-03-26: 10 g via ORAL
  Filled 2022-03-26: qty 1

## 2022-03-26 NOTE — Telephone Encounter (Signed)
Spoke with patient's niece, [name], and counseled her regarding his risk for severe, complication as a result of his hyperkalemia. I explained his risk for cardiac arrhythmias and resulting cardiac arrest. I reiterated that he will likely have a complication that results in his death if he does not get his potassium lowered to a save range. I urged them to go to the ED for further evaluation and management. [name] admits to understanding and states that she will do everything she can to get him to the ED.  ? ?Lawerance Cruel, D.O.  ?Internal Medicine Resident, PGY-3 ?[facility]  ?3:17 PM, 03/26/2022  ? ? ?

## 2022-03-26 NOTE — ED Provider Triage Note (Signed)
Emergency Medicine Provider Triage Evaluation Note ? ?Derrick Hammock. , a 81 y.o. male  was evaluated in triage.  Pt complains of abnormal lab. ? ?Review of Systems  ?Positive: Abnormal lab ?Negative: Cp, sob, abd pain ? ?Physical Exam  ?BP (!) 149/91   Pulse 81   Temp 98.2 ?F (36.8 ?C) (Oral)   Resp 18   Ht '6\' 2"'$  (1.88 m)   Wt 79.3 kg   SpO2 98%   BMI 22.45 kg/m?  ?Gen:   Awake, no distress   ?Resp:  Normal effort  ?MSK:   Moves extremities without difficulty  ?Other:   ? ?Medical Decision Making  ?Medically screening exam initiated at 5:31 PM.  Appropriate orders placed.  Derrick Hammock. was informed that the remainder of the evaluation will be completed by another provider, this initial triage assessment does not replace that evaluation, and the importance of remaining in the ED until their evaluation is complete. ? ?Pt report his doctor performed blood work on him yesterday and his K+ was high.  He is without any complaint.  ?  ?Domenic Moras, PA-C ?03/26/22 1736 ? ?

## 2022-03-26 NOTE — Telephone Encounter (Signed)
Attempted to call patient to follow up on his recent clinic appointment in which he was found to have a significantly elevated potassium. At that time, the patient was instructed to go to the ED for further evaluation and management of his hyperkalemia. The patient and his niece, [name], confirmed understanding of my recommendation and stated that they would go to the ED immediately. Unfortunately, I was unable to reach him today to make sure he was able to be seen in the ED. I left a HIPPA compliant voice mail to call us back.  ? ? ?Lawerance Cruel, D.O.  ?Internal Medicine Resident, PGY-3 ?[facility]  ?Pager: [phone] (If no response, then contact on call pager) ?2:08 PM, 03/26/2022  ? ?

## 2022-03-26 NOTE — Progress Notes (Signed)
Internal Medicine Clinic Attending ? ?Case discussed with Dr. Marianna Payment  At the time of the visit.  We reviewed the resident?s history and exam and pertinent patient test results.  I agree with the assessment, diagnosis, and plan of care documented in the resident?s note.  ? ? ?Patient's potassium surprisingly came back at 6.7, not hemolyzed. Dr. Marianna Payment informed me immediately, and he called the patient to instruct him to go to the ER immediately for treatment. I rechecked patient's chart this morning, and I don't see an ER encounter - Dr. Marianna Payment will call patient again today to make sure he went to the ER  ?

## 2022-03-26 NOTE — ED Notes (Addendum)
This RN assumed care of this pt on assessment pt in room fully clothed, not on cardiac monitor. Pt shivering, complaining of being cold and diaphoretic. Pt warm to touch. Rectal temp of 103.2, PRN tylenol given. Pt placed on cardiac monitor, pt tachy in the 130s, EKG obtained, EDP [place] given EKG. Dr. Eulas Post called to bedside to evaluate pt  ?

## 2022-03-26 NOTE — Telephone Encounter (Signed)
Just got off phone with [name]. Tracey's sister is on her way to pick up the patient and take him to the ER now. She understands the gravity of the situation and that he absolutely needs to come to the ER ?

## 2022-03-26 NOTE — ED Provider Notes (Signed)
?[facility] ?Provider Note ? ? ?CSN: [id number] ?Arrival date & time: 03/26/22  1605 ? ?  ? ?History ? ?Chief Complaint  ?Patient presents with  ? Abnormal Lab  ? ? ?Derrick Hendrix. is a 81 y.o. male. ? ?Patient was instructed to come to the ED by his primary care doctor due to high potassium level.  History of chronic kidney disease.  Not on hemodialysis.  History of also high cholesterol ? ?The history is provided by the patient.  ?Abnormal Lab ?Time since result:  Potassium >6 ?Patient referred by:  PCP ?Resulting agency:  [facility] ?Result type: chemistry   ?Chemistry:  ?  Potassium:  High ? ?  ? ?Home Medications ?Prior to Admission medications   ?Medication Sig Start Date End Date Taking? Authorizing Provider  ?amLODipine (NORVASC) 10 MG tablet Take 1 tablet (10 mg total) by mouth daily. 03/02/22   [name], MD  ?apixaban (ELIQUIS) 2.5 MG TABS tablet TAKE ONE TABLET BY MOUTH TWICE A DAY 02/18/22   Allred, Jeneen Rinks, MD  ?atorvastatin (LIPITOR) 80 MG tablet TAKE ONE TABLET BY MOUTH DAILY 10/02/21   [name], MD  ?Blood Pressure Monitoring (BLOOD PRESSURE KIT) DEVI Please check blood pressure twice a day. In the morning and evening. Please record numbers on a sheet of paper. Blood pressure parameters are 120-150/80-90 05/15/20   [name], MD  ?calcitRIOL (ROCALTROL) 0.25 MCG capsule Take 0.25 mcg by mouth daily. 07/12/20   [provider]  ?clobetasol cream (TEMOVATE) 0.05 % APPLY TOPICALLY TWO (TWO) TIMES DAILY. ?Patient taking differently: Apply 1 application. topically 2 (two) times daily. 11/13/20   Riesa Pope, MD  ?docusate sodium (COLACE) 100 MG capsule Take 1 capsule (100 mg total) by mouth daily. 06/02/18   Allred, Jeneen Rinks, MD  ?finasteride (PROSCAR) 5 MG tablet TAKE ONE TABLET BY MOUTH DAILY 10/02/21   [name], MD  ?furosemide (LASIX) 40 MG tablet TAKE ONE TABLET BY MOUTH TWICE A DAY 08/04/21   [name], MD   ?gabapentin (NEURONTIN) 100 MG capsule Take 1 capsule (100 mg total) by mouth at bedtime. 02/28/22 05/29/22  [name], MD  ?hydrALAZINE (APRESOLINE) 10 MG tablet TAKE 1.5 TABLETS BY MOUTH THREE TIMES A DAY 10/02/21   [name], MD  ?nitroGLYCERIN (NITROSTAT) 0.4 MG SL tablet Place 1 tablet (0.4 mg total) under the tongue every 5 (five) minutes x 3 doses as needed for chest pain. 11/29/14   Luan Moore, MD  ?omeprazole (PRILOSEC) 40 MG capsule TAKE ONE CAPSULE SHORTLY BEFORE BREAKFAST EACH MORNING 01/08/22   Milus Banister, MD  ?predniSONE (DELTASONE) 10 MG tablet Take 10 mg by mouth daily.  05/22/20   [provider]  ?sodium bicarbonate 650 MG tablet TAKE TWO TABLETS BY MOUTH TWICE A DAY 10/02/21   [name], MD  ?tamsulosin (FLOMAX) 0.4 MG CAPS capsule TAKE TWO CAPSULES BY MOUTH DAILY 10/02/21   [name], MD  ?VELTASSA 8.4 g packet Take 1 packet by mouth daily. 10/31/20   [provider]  ?   ? ?Allergies    ?Aspirin   ? ?Review of Systems   ?Review of Systems ? ?Physical Exam ?Updated Vital Signs ?BP (!) 154/86 (BP Location: Left Arm)   Pulse 74   Temp 98.2 ?F (36.8 ?C) (Oral)   Resp 15   Ht _0  (1.88 m)   Wt 79.3 kg   SpO2 99%   BMI 22.45 kg/m?  ?Physical Exam ?Vitals and nursing note reviewed.  ?  Constitutional:   ?   General: He is not in acute distress. ?   Appearance: He is well-developed.  ?HENT:  ?   Head: Normocephalic and atraumatic.  ?Eyes:  ?   Extraocular Movements: Extraocular movements intact.  ?   Conjunctiva/sclera: Conjunctivae normal.  ?   Pupils: Pupils are equal, round, and reactive to light.  ?Cardiovascular:  ?   Rate and Rhythm: Normal rate and regular rhythm.  ?   Heart sounds: No murmur heard. ?Pulmonary:  ?   Effort: Pulmonary effort is normal. No respiratory distress.  ?   Breath sounds: Normal breath sounds.  ?Abdominal:  ?   Palpations: Abdomen is soft.  ?   Tenderness: There is no abdominal tenderness.  ?Musculoskeletal:      ?   General: No swelling.  ?   Cervical back: Neck supple.  ?Skin: ?   General: Skin is warm and dry.  ?   Capillary Refill: Capillary refill takes less than 2 seconds.  ?Neurological:  ?   General: No focal deficit present.  ?   Mental Status: He is alert.  ?Psychiatric:     ?   Mood and Affect: Mood normal.  ? ? ?ED Results / Procedures / Treatments   ?Labs ?(all labs ordered are listed, but only abnormal results are displayed) ?Labs Reviewed  ?BASIC METABOLIC PANEL - Abnormal; Notable for the following components:  ?    Result Value  ? Potassium 6.7 (*)   ? CO2 20 (*)   ? Glucose, Bld 118 (*)   ? BUN 93 (*)   ? Creatinine, Ser 6.01 (*)   ? Calcium 8.3 (*)   ? GFR, Estimated 9 (*)   ? All other components within normal limits  ?POTASSIUM - Abnormal; Notable for the following components:  ? Potassium 5.8 (*)   ? All other components within normal limits  ?CBC WITH DIFFERENTIAL/PLATELET - Abnormal; Notable for the following components:  ? RBC 3.44 (*)   ? Hemoglobin 10.6 (*)   ? HCT 35.0 (*)   ? MCV 101.7 (*)   ? RDW 15.9 (*)   ? All other components within normal limits  ?MAGNESIUM - Abnormal; Notable for the following components:  ? Magnesium 2.7 (*)   ? All other components within normal limits  ?TROPONIN I (HIGH SENSITIVITY) - Abnormal; Notable for the following components:  ? Troponin I (High Sensitivity) 18 (*)   ? All other components within normal limits  ?POTASSIUM  ?POTASSIUM  ?POTASSIUM  ?TROPONIN I (HIGH SENSITIVITY)  ? ? ?EKG ?EKG Interpretation ? ?Date/Time:  Wednesday March 26 2022 17:14:38 EDT ?Ventricular Rate:  74 ?PR Interval:  160 ?QRS Duration: 100 ?QT Interval:  384 ?QTC Calculation: 426 ?R Axis:   60 ?Text Interpretation: Normal sinus rhythm Minimal voltage criteria for LVH, may be normal variant ( Sokolow-Lyon ) Borderline ECG When compared with ECG of 20-Nov-2020 10:34, PREVIOUS ECG IS PRESENT Confirmed by Lennice Sites [phone]) on 03/26/2022 9:20:36 PM ? ?Radiology ?No results  found. ? ?Procedures ?Marland KitchenCritical Care ?Performed by: Lennice Sites, DO ?Authorized by: Lennice Sites, DO  ? ?Critical care provider statement:  ?  Critical care time (minutes):  35 ?  Critical care was necessary to treat or prevent imminent or life-threatening deterioration of the following conditions: hyperkalemia. ?  Critical care was time spent personally by me on the following activities:  Blood draw for specimens, development of treatment plan with patient or surrogate, discussions with primary provider, evaluation of  patient's response to treatment, examination of patient, obtaining history from patient or surrogate, ordering and performing treatments and interventions, ordering and review of laboratory studies, ordering and review of radiographic studies, pulse oximetry, re-evaluation of patient's condition and review of old charts ?  Care discussed with: admitting provider    ? ? ?Medications Ordered in ED ?Medications  ?sodium zirconium cyclosilicate (LOKELMA) packet 10 g (has no administration in time range)  ?albuterol (PROVENTIL) (2.5 MG/3ML) 0.083% nebulizer solution 10 mg (has no administration in time range)  ?calcium gluconate inj 10% (1 g) URGENT USE ONLY! (has no administration in time range)  ?insulin aspart (novoLOG) injection 5 Units (has no administration in time range)  ?  And  ?dextrose 50 % solution 50 mL (has no administration in time range)  ?furosemide (LASIX) injection 20 mg (has no administration in time range)  ? ? ?ED Course/ Medical Decision Making/ A&P ?  ?                        ?Medical Decision Making ?Amount and/or Complexity of Data Reviewed ?Labs: ordered. ?ECG/medicine tests: ordered. ? ?Risk ?OTC drugs. ?Prescription drug management. ?Decision regarding hospitalization. ? ? ?Derrick Hendrix. is here with elevated potassium.  Patient sent here from primary care doctor's office due to elevated potassium.  Potassium was 6.7 yesterday.  History of chronic kidney disease but  per chart review not a candidate for dialysis.  Otherwise he is feeling well.  Repeat lab work today shows potassium still consistently at 6.7.  Creatinine appears to be baseline.  EKG does not show any obvious hyperkalemic changes.

## 2022-03-26 NOTE — Telephone Encounter (Signed)
Attempted to call patient/niece back. Left message asking them to call the clinic and ask to speak to Dr. Marianna Payment as soon as possible.  ? ? ?Lawerance Cruel, D.O.  ?Internal Medicine Resident, PGY-3 ?[facility]  ?3:04 PM, 03/26/2022  ? ?

## 2022-03-26 NOTE — Telephone Encounter (Signed)
Rec'd call from Pt's niece the pt refused to go to the ER as instructed per Dr. Marianna Payment. ?

## 2022-03-26 NOTE — ED Triage Notes (Signed)
Pt sent by PCP for hyperkalemia. Pt unsure how high it was. Pt denies chest pain or any other sx ?

## 2022-03-27 ENCOUNTER — Other Ambulatory Visit: Payer: Self-pay

## 2022-03-27 ENCOUNTER — Observation Stay (HOSPITAL_COMMUNITY): Payer: Medicare HMO

## 2022-03-27 DIAGNOSIS — R0902 Hypoxemia: Secondary | ICD-10-CM | POA: Diagnosis not present

## 2022-03-27 DIAGNOSIS — N400 Enlarged prostate without lower urinary tract symptoms: Secondary | ICD-10-CM | POA: Diagnosis not present

## 2022-03-27 DIAGNOSIS — N281 Cyst of kidney, acquired: Secondary | ICD-10-CM | POA: Diagnosis not present

## 2022-03-27 DIAGNOSIS — E1122 Type 2 diabetes mellitus with diabetic chronic kidney disease: Secondary | ICD-10-CM | POA: Diagnosis not present

## 2022-03-27 DIAGNOSIS — A4151 Sepsis due to Escherichia coli [E. coli]: Secondary | ICD-10-CM | POA: Diagnosis not present

## 2022-03-27 DIAGNOSIS — Z7189 Other specified counseling: Secondary | ICD-10-CM | POA: Diagnosis not present

## 2022-03-27 DIAGNOSIS — E872 Acidosis, unspecified: Secondary | ICD-10-CM | POA: Diagnosis not present

## 2022-03-27 DIAGNOSIS — I69354 Hemiplegia and hemiparesis following cerebral infarction affecting left non-dominant side: Secondary | ICD-10-CM | POA: Diagnosis not present

## 2022-03-27 DIAGNOSIS — I959 Hypotension, unspecified: Secondary | ICD-10-CM | POA: Diagnosis not present

## 2022-03-27 DIAGNOSIS — Z789 Other specified health status: Secondary | ICD-10-CM | POA: Diagnosis not present

## 2022-03-27 DIAGNOSIS — N185 Chronic kidney disease, stage 5: Secondary | ICD-10-CM | POA: Diagnosis not present

## 2022-03-27 DIAGNOSIS — K089 Disorder of teeth and supporting structures, unspecified: Secondary | ICD-10-CM | POA: Diagnosis not present

## 2022-03-27 DIAGNOSIS — A419 Sepsis, unspecified organism: Secondary | ICD-10-CM | POA: Diagnosis not present

## 2022-03-27 DIAGNOSIS — B9620 Unspecified Escherichia coli [E. coli] as the cause of diseases classified elsewhere: Secondary | ICD-10-CM | POA: Diagnosis not present

## 2022-03-27 DIAGNOSIS — I700 Atherosclerosis of aorta: Secondary | ICD-10-CM | POA: Diagnosis not present

## 2022-03-27 DIAGNOSIS — Z515 Encounter for palliative care: Secondary | ICD-10-CM | POA: Diagnosis not present

## 2022-03-27 DIAGNOSIS — D72810 Lymphocytopenia: Secondary | ICD-10-CM | POA: Diagnosis not present

## 2022-03-27 DIAGNOSIS — E875 Hyperkalemia: Secondary | ICD-10-CM

## 2022-03-27 DIAGNOSIS — I69398 Other sequelae of cerebral infarction: Secondary | ICD-10-CM | POA: Diagnosis not present

## 2022-03-27 DIAGNOSIS — R638 Other symptoms and signs concerning food and fluid intake: Secondary | ICD-10-CM | POA: Diagnosis not present

## 2022-03-27 DIAGNOSIS — E1151 Type 2 diabetes mellitus with diabetic peripheral angiopathy without gangrene: Secondary | ICD-10-CM | POA: Diagnosis not present

## 2022-03-27 DIAGNOSIS — G255 Other chorea: Secondary | ICD-10-CM | POA: Diagnosis not present

## 2022-03-27 DIAGNOSIS — D539 Nutritional anemia, unspecified: Secondary | ICD-10-CM | POA: Diagnosis not present

## 2022-03-27 DIAGNOSIS — E1142 Type 2 diabetes mellitus with diabetic polyneuropathy: Secondary | ICD-10-CM | POA: Diagnosis not present

## 2022-03-27 DIAGNOSIS — E7800 Pure hypercholesterolemia, unspecified: Secondary | ICD-10-CM | POA: Diagnosis not present

## 2022-03-27 DIAGNOSIS — I120 Hypertensive chronic kidney disease with stage 5 chronic kidney disease or end stage renal disease: Secondary | ICD-10-CM | POA: Diagnosis not present

## 2022-03-27 DIAGNOSIS — E1110 Type 2 diabetes mellitus with ketoacidosis without coma: Secondary | ICD-10-CM | POA: Diagnosis not present

## 2022-03-27 DIAGNOSIS — M109 Gout, unspecified: Secondary | ICD-10-CM | POA: Diagnosis not present

## 2022-03-27 DIAGNOSIS — I480 Paroxysmal atrial fibrillation: Secondary | ICD-10-CM | POA: Diagnosis not present

## 2022-03-27 DIAGNOSIS — N390 Urinary tract infection, site not specified: Secondary | ICD-10-CM | POA: Diagnosis not present

## 2022-03-27 DIAGNOSIS — D72829 Elevated white blood cell count, unspecified: Secondary | ICD-10-CM | POA: Diagnosis not present

## 2022-03-27 DIAGNOSIS — N289 Disorder of kidney and ureter, unspecified: Secondary | ICD-10-CM | POA: Diagnosis not present

## 2022-03-27 DIAGNOSIS — Z20822 Contact with and (suspected) exposure to covid-19: Secondary | ICD-10-CM | POA: Diagnosis not present

## 2022-03-27 DIAGNOSIS — M19071 Primary osteoarthritis, right ankle and foot: Secondary | ICD-10-CM | POA: Diagnosis not present

## 2022-03-27 DIAGNOSIS — D631 Anemia in chronic kidney disease: Secondary | ICD-10-CM | POA: Diagnosis not present

## 2022-03-27 DIAGNOSIS — Z66 Do not resuscitate: Secondary | ICD-10-CM | POA: Diagnosis not present

## 2022-03-27 DIAGNOSIS — J9811 Atelectasis: Secondary | ICD-10-CM | POA: Diagnosis not present

## 2022-03-27 DIAGNOSIS — N179 Acute kidney failure, unspecified: Secondary | ICD-10-CM | POA: Diagnosis not present

## 2022-03-27 LAB — COMPREHENSIVE METABOLIC PANEL
ALT: 9 U/L (ref 0–44)
AST: 14 U/L — ABNORMAL LOW (ref 15–41)
Albumin: 3.6 g/dL (ref 3.5–5.0)
Alkaline Phosphatase: 63 U/L (ref 38–126)
Anion gap: 12 (ref 5–15)
BUN: 94 mg/dL — ABNORMAL HIGH (ref 8–23)
CO2: 14 mmol/L — ABNORMAL LOW (ref 22–32)
Calcium: 8.1 mg/dL — ABNORMAL LOW (ref 8.9–10.3)
Chloride: 111 mmol/L (ref 98–111)
Creatinine, Ser: 6.12 mg/dL — ABNORMAL HIGH (ref 0.61–1.24)
GFR, Estimated: 8 mL/min — ABNORMAL LOW (ref >60)
Glucose, Bld: 71 mg/dL (ref 70–99)
Potassium: 4.6 mmol/L (ref 3.5–5.1)
Sodium: 137 mmol/L (ref 135–145)
Total Bilirubin: 0.7 mg/dL (ref 0.3–1.2)
Total Protein: 6.8 g/dL (ref 6.5–8.1)

## 2022-03-27 LAB — CBC WITH DIFFERENTIAL/PLATELET
Abs Immature Granulocytes: 0.01 10*3/uL (ref 0.00–0.07)
Basophils Absolute: 0 10*3/uL (ref 0.0–0.1)
Basophils Relative: 0 %
Eosinophils Absolute: 0 10*3/uL (ref 0.0–0.5)
Eosinophils Relative: 0 %
HCT: 32 % — ABNORMAL LOW (ref 39.0–52.0)
Hemoglobin: 9.6 g/dL — ABNORMAL LOW (ref 13.0–17.0)
Immature Granulocytes: 0 %
Lymphocytes Relative: 4 %
Lymphs Abs: 0.1 10*3/uL — ABNORMAL LOW (ref 0.7–4.0)
MCH: 30.6 pg (ref 26.0–34.0)
MCHC: 30 g/dL (ref 30.0–36.0)
MCV: 101.9 fL — ABNORMAL HIGH (ref 80.0–100.0)
Monocytes Absolute: 0 10*3/uL — ABNORMAL LOW (ref 0.1–1.0)
Monocytes Relative: 0 %
Neutro Abs: 2.3 10*3/uL (ref 1.7–7.7)
Neutrophils Relative %: 96 %
Platelets: 222 10*3/uL (ref 150–400)
RBC: 3.14 MIL/uL — ABNORMAL LOW (ref 4.22–5.81)
RDW: 16 % — ABNORMAL HIGH (ref 11.5–15.5)
WBC: 2.4 10*3/uL — ABNORMAL LOW (ref 4.0–10.5)
nRBC: 0 % (ref 0.0–0.2)

## 2022-03-27 LAB — VITAMIN B12: Vitamin B-12: 187 pg/mL (ref 180–914)

## 2022-03-27 LAB — BASIC METABOLIC PANEL
Anion gap: 11 (ref 5–15)
Anion gap: 17 — ABNORMAL HIGH (ref 5–15)
BUN: 91 mg/dL — ABNORMAL HIGH (ref 8–23)
BUN: 99 mg/dL — ABNORMAL HIGH (ref 8–23)
CO2: 15 mmol/L — ABNORMAL LOW (ref 22–32)
CO2: 12 mmol/L — ABNORMAL LOW (ref 22–32)
Calcium: 7.8 mg/dL — ABNORMAL LOW (ref 8.9–10.3)
Calcium: 7.7 mg/dL — ABNORMAL LOW (ref 8.9–10.3)
Chloride: 111 mmol/L (ref 98–111)
Chloride: 108 mmol/L (ref 98–111)
Creatinine, Ser: 6.05 mg/dL — ABNORMAL HIGH (ref 0.61–1.24)
Creatinine, Ser: 6.49 mg/dL — ABNORMAL HIGH (ref 0.61–1.24)
GFR, Estimated: 9 mL/min — ABNORMAL LOW (ref >60)
GFR, Estimated: 8 mL/min — ABNORMAL LOW (ref >60)
Glucose, Bld: 95 mg/dL (ref 70–99)
Glucose, Bld: 90 mg/dL (ref 70–99)
Potassium: 4.5 mmol/L (ref 3.5–5.1)
Potassium: 4.9 mmol/L (ref 3.5–5.1)
Sodium: 137 mmol/L (ref 135–145)
Sodium: 137 mmol/L (ref 135–145)

## 2022-03-27 LAB — POTASSIUM: Potassium: 5.1 mmol/L (ref 3.5–5.1)

## 2022-03-27 LAB — URINALYSIS, ROUTINE W REFLEX MICROSCOPIC
Bilirubin Urine: NEGATIVE
Glucose, UA: NEGATIVE mg/dL
Ketones, ur: NEGATIVE mg/dL
Nitrite: NEGATIVE
Protein, ur: 100 mg/dL — AB
RBC / HPF: >50 RBC/hpf — ABNORMAL HIGH (ref 0–5)
Specific Gravity, Urine: 1.012 (ref 1.005–1.030)
WBC, UA: >50 WBC/hpf — ABNORMAL HIGH (ref 0–5)
pH: 5 (ref 5.0–8.0)

## 2022-03-27 LAB — CBC
HCT: 33.8 % — ABNORMAL LOW (ref 39.0–52.0)
Hemoglobin: 10.3 g/dL — ABNORMAL LOW (ref 13.0–17.0)
MCH: 30.9 pg (ref 26.0–34.0)
MCHC: 30.5 g/dL (ref 30.0–36.0)
MCV: 101.5 fL — ABNORMAL HIGH (ref 80.0–100.0)
Platelets: 227 10*3/uL (ref 150–400)
RBC: 3.33 MIL/uL — ABNORMAL LOW (ref 4.22–5.81)
RDW: 15.9 % — ABNORMAL HIGH (ref 11.5–15.5)
WBC: 1 10*3/uL — CL (ref 4.0–10.5)
nRBC: 2 % — ABNORMAL HIGH (ref 0.0–0.2)

## 2022-03-27 LAB — PHOSPHORUS: Phosphorus: 5.9 mg/dL — ABNORMAL HIGH (ref 2.5–4.6)

## 2022-03-27 LAB — RESP PANEL BY RT-PCR (FLU A&B, COVID) ARPGX2
Influenza A by PCR: NEGATIVE
Influenza B by PCR: NEGATIVE
SARS Coronavirus 2 by RT PCR: NEGATIVE

## 2022-03-27 LAB — LACTIC ACID, PLASMA
Lactic Acid, Venous: 2.8 mmol/L (ref 0.5–1.9)
Lactic Acid, Venous: 2.9 mmol/L (ref 0.5–1.9)
Lactic Acid, Venous: 2.1 mmol/L (ref 0.5–1.9)
Lactic Acid, Venous: 2.1 mmol/L (ref 0.5–1.9)

## 2022-03-27 LAB — C-REACTIVE PROTEIN: CRP: <0.5 mg/dL (ref <1.0)

## 2022-03-27 LAB — FOLATE: Folate: 11.2 ng/mL (ref >5.9)

## 2022-03-27 LAB — PROCALCITONIN: Procalcitonin: 2.02 ng/mL

## 2022-03-27 LAB — PATHOLOGIST SMEAR REVIEW

## 2022-03-27 MED ORDER — SODIUM CHLORIDE 0.9 % IV BOLUS
500.0000 mL | Freq: Once | INTRAVENOUS | Status: AC
  Administered 2022-03-27: 500 mL via INTRAVENOUS

## 2022-03-27 MED ORDER — SODIUM CHLORIDE 0.9 % IV SOLN
2.0000 g | Freq: Once | INTRAVENOUS | Status: AC
  Administered 2022-03-27: 2 g via INTRAVENOUS
  Filled 2022-03-27: qty 12.5

## 2022-03-27 MED ORDER — VANCOMYCIN HCL 750 MG/150ML IV SOLN: 750.0000 mg | INTRAVENOUS | Status: DC

## 2022-03-27 MED ORDER — CEFEPIME HCL 1 G IJ SOLR: 1.0000 g | INTRAMUSCULAR | Status: DC

## 2022-03-27 MED ORDER — APIXABAN 2.5 MG PO TABS
2.5000 mg | ORAL_TABLET | Freq: Two times a day (BID) | ORAL | Status: DC
  Administered 2022-03-27 – 2022-04-01 (×11): 2.5 mg via ORAL
  Filled 2022-03-27 (×11): qty 1

## 2022-03-27 MED ORDER — VANCOMYCIN HCL 1750 MG/350ML IV SOLN
1750.0000 mg | Freq: Once | INTRAVENOUS | Status: AC
  Administered 2022-03-27: 1750 mg via INTRAVENOUS
  Filled 2022-03-27: qty 350

## 2022-03-27 MED ORDER — SODIUM CHLORIDE 0.9 % IV SOLN
1.0000 g | INTRAVENOUS | Status: DC
  Administered 2022-03-27 – 2022-03-28 (×2): 1 g via INTRAVENOUS
  Filled 2022-03-27 (×2): qty 10

## 2022-03-27 MED ORDER — SODIUM ZIRCONIUM CYCLOSILICATE 10 G PO PACK
10.0000 g | PACK | Freq: Once | ORAL | Status: AC
  Administered 2022-03-27: 10 g via ORAL
  Filled 2022-03-27: qty 1

## 2022-03-27 MED ORDER — METRONIDAZOLE 500 MG/100ML IV SOLN
500.0000 mg | Freq: Two times a day (BID) | INTRAVENOUS | Status: DC
  Administered 2022-03-27: 500 mg via INTRAVENOUS
  Filled 2022-03-27: qty 100

## 2022-03-27 MED ORDER — MELATONIN 5 MG PO TABS
5.0000 mg | ORAL_TABLET | Freq: Every day | ORAL | Status: DC
Start: 2022-03-27 — End: 2022-04-03
  Administered 2022-03-27 – 2022-04-01 (×6): 5 mg via ORAL
  Filled 2022-03-27 (×6): qty 1

## 2022-03-27 MED ORDER — SODIUM BICARBONATE 650 MG PO TABS
1300.0000 mg | ORAL_TABLET | Freq: Three times a day (TID) | ORAL | Status: DC
  Administered 2022-03-27 – 2022-04-01 (×15): 1300 mg via ORAL
  Filled 2022-03-27 (×15): qty 2

## 2022-03-27 NOTE — ED Notes (Addendum)
Pt complaining of no pain or SOB at this time  ?

## 2022-03-27 NOTE — ED Notes (Signed)
MD notified of critical WBC ?

## 2022-03-27 NOTE — ED Notes (Signed)
X-ray at bedside

## 2022-03-27 NOTE — ED Notes (Signed)
Lab to add on procalcitonin 

## 2022-03-27 NOTE — ED Notes (Addendum)
Pt not shivering as bad now, pt lethargic and seems to be resting - Pt alert to name and age at this time  ?

## 2022-03-27 NOTE — ED Notes (Addendum)
No further orders for pt temp at this time  ?

## 2022-03-27 NOTE — ED Notes (Addendum)
Pt is hard stick - only able to obtain one set of cultures - MD aware  ?

## 2022-03-27 NOTE — Consult Note (Signed)
? ?                                                                                ?Consultation Note ?Date: 03/27/2022  ? ?Patient Name: Derrick Hendrix.  ?DOB: 1941/01/14  MRN: [id number]  Age / Sex: 81 y.o., male  ?PCP: [name], MD ?Referring Physician: Aldine Contes, MD ? ?Reason for Consultation: Establishing goals of care ? ?HPI/Patient Profile: 81 y.o. male  with past medical history of CKD stage V with recurrent hyperkalemia, hypertension, hyperlipidemia, CVA with residual left-sided weakness and left-sided hemiballismus, A-fib on Eliquis, peripheral vascular disease and type 2 diabetes  admitted on 03/26/2022 with hyperkalemia and sepsis of unclear etiology. ? ?Patient has expressed his wishes to avoid HD in both outpatient and inpatient settings. PMT has been consulted to assist with goals of care conversation. ? ?Clinical Assessment and Goals of Care: ? ?I have reviewed medical records including EPIC notes, labs and imaging, received report from RN, assessed the patient and then met at the bedside to discuss diagnosis prognosis, GOC, EOL wishes, disposition and options. ? ?I introduced Palliative Medicine as specialized medical care for people living with serious illness. It focuses on providing relief from the symptoms and stress of a serious illness. The goal is to improve quality of life for both the patient and the family. ? ?We discussed a brief life review of the patient and then focused on their current illness. The natural disease trajectory and expectations at EOL were discussed. ? ?I attempted to elicit values and goals of care important to the patient.   ? ?Medical History Review and Understanding: ?Review patient's care plan and treatment for hyperkalemia and sepsis. He verbalizes his understanding. ? ?Social History: ?Patient lives at home alone with family that comes to check on him regularly. He is supported by friends. He believes in God and says he is somewhat religious.    ? ?Functional and Nutritional State: ?Patient tells me he is completely independent with ADLs. ? ? ?Code Status: ?Concepts specific to code status, artifical feeding and hydration, and rehospitalization were considered and discussed. ? ?Discussion: ?Patient tells me he is very uncomfortable in the ED bed and desperately wants to get some sleep. He confirms that he does not want to pursue HD if indicated. We discussed his quality of life and he initial states it is "pretty good." After further discussion of his options including a comfort-focused approach if aligned with goals of care, he clarifies that his current quality of life is what he would find acceptable at a minimum. I shared my worry that he is at high risk for a decreased quality of life and emphasized the importance of planning for this potential. He agrees to discuss further on a follow up visit. ? ? ?The difference between aggressive medical intervention and comfort care was considered in light of the patient's goals of care. Hospice and Palliative Care services outpatient were explained and offered.  ? ?Discussed the importance of continued conversation with family and the medical providers regarding overall plan of care and treatment options, ensuring decisions are within the context of the patient?s values and GOCs.  ? ?Questions and concerns were  addressed.  Hard Choices booklet left for review. The family was encouraged to call with questions or concerns.  PMT will continue to support holistically.  ? ?PATIENT is the primary decision maker. No HCPOA on file. ?  ? ?SUMMARY OF RECOMMENDATIONS   ?-DNR ?-Continue medical management ?-Patient finds current QOL to be acceptable for the time being ?-Psychosocial and emotional support provided ?-PMT will continue to follow ? ?Prognosis:  ?Unable to determine ? ?Discharge Planning: To Be Determined  ? ?  ? ?Primary Diagnoses: ?Present on Admission: ? Hyperkalemia ? ? ?I have reviewed the medical record,  interviewed the patient and family, and examined the patient. The following aspects are pertinent. ? ?Past Medical History:  ?Diagnosis Date  ? Abscess of foot without toes, left 08/28/2017  ? Chronic kidney disease (CKD), stage III (moderate) (HCC)   ? Constipation 11/07/2014  ? Daily headache   ? "lately" (04/17/2016)  ? Depression   ? Dizziness 03/07/2016  ? Headache 06/26/2014  ? Heart murmur   ? Hyperkalemia 10/05/2018  ? Hyperlipemia 11/11/2016  ? Hypertension   ? Neuropathic pain 02/06/2015  ? Osteomyelitis ([place])   ? PAD (peripheral artery disease) ([place])   ? Paronychia of great toe, right   ? Preop cardiovascular exam   ? Right foot pain 02/09/2017  ? Stable angina ([place]) 06/27/2014  ? Stroke [facility]) 03/2016  ? hx of PAD; j"ust lots of headaches since" (04/17/2016)  ? Vitamin B12 deficiency 06/27/2014  ? Weight loss, unintentional 06/27/2014  ? ?Social History  ? ?Socioeconomic History  ? Marital status: Single  ?  Spouse name: Not on file  ? Number of children: Not on file  ? Years of education: Not on file  ? Highest education level: Not on file  ?Occupational History  ? Not on file  ?Tobacco Use  ? Smoking status: Former  ?  Years: 66.00  ?  Types: Cigarettes  ?  Start date: 06/23/2017  ? Smokeless tobacco: Never  ?Vaping Use  ? Vaping Use: Never used  ?Substance and Sexual Activity  ? Alcohol use: Not Currently  ?  Comment: 04/17/2016 "nothing in the last 3-4 years"  ? Drug use: No  ? Sexual activity: Never  ?Other Topics Concern  ? Not on file  ?Social History Narrative  ? Works in the yard  ?   ? ?Social Determinants of Health  ? ?Financial Resource Strain: Not on file  ?Food Insecurity: No Food Insecurity  ? Worried About Charity fundraiser in the Last Year: Never true  ? Ran Out of Food in the Last Year: Never true  ?Transportation Needs: No Transportation Needs  ? Lack of Transportation (Medical): No  ? Lack of Transportation (Non-Medical): No  ?Physical Activity: Not on file  ?Stress: Not on file  ?Social  Connections: Unknown  ? Frequency of Communication with Friends and Family: More than three times a week  ? Frequency of Social Gatherings with Friends and Family: More than three times a week  ? Attends Religious Services: Not on file  ? Active Member of Clubs or Organizations: Not on file  ? Attends Archivist Meetings: Not on file  ? Marital Status: Not on file  ? ?Family History  ?Problem Relation Age of Onset  ? Hypertension Mother   ?     died age 28  ? Diabetes Mother   ? ?Scheduled Meds: ? apixaban  2.5 mg Oral BID  ? atorvastatin  80 mg Oral Daily  ?  calcitRIOL  0.25 mcg Oral Daily  ? finasteride  5 mg Oral Daily  ? hydrALAZINE  15 mg Oral TID  ? pantoprazole  40 mg Oral Daily  ? sodium bicarbonate  1,300 mg Oral TID  ? tamsulosin  0.8 mg Oral Daily  ? ?Continuous Infusions: ? cefTRIAXone (ROCEPHIN)  IV Stopped (03/27/22 1425)  ? [START ON 03/29/2022] vancomycin    ? ?PRN Meds:.acetaminophen **OR** acetaminophen ?Medications Prior to Admission:  ?Prior to Admission medications   ?Medication Sig Start Date End Date Taking? Authorizing Provider  ?amLODipine (NORVASC) 10 MG tablet Take 1 tablet (10 mg total) by mouth daily. 03/02/22  Yes [name], MD  ?apixaban (ELIQUIS) 2.5 MG TABS tablet TAKE ONE TABLET BY MOUTH TWICE A DAY ?Patient taking differently: Take 2.5 mg by mouth 2 (two) times daily. 02/18/22  Yes Allred, Jeneen Rinks, MD  ?atorvastatin (LIPITOR) 80 MG tablet TAKE ONE TABLET BY MOUTH DAILY ?Patient taking differently: Take 80 mg by mouth daily. 10/02/21  Yes [name], MD  ?finasteride (PROSCAR) 5 MG tablet TAKE ONE TABLET BY MOUTH DAILY ?Patient taking differently: Take 5 mg by mouth daily. 10/02/21  Yes [name], MD  ?furosemide (LASIX) 40 MG tablet TAKE ONE TABLET BY MOUTH TWICE A DAY ?Patient taking differently: Take 40 mg by mouth 2 (two) times daily. 08/04/21  Yes [name], MD  ?gabapentin (NEURONTIN) 100 MG capsule Take 1 capsule (100 mg total) by mouth at  bedtime. 02/28/22 05/29/22 Yes [name], MD  ?hydrALAZINE (APRESOLINE) 10 MG tablet TAKE 1.5 TABLETS BY MOUTH THREE TIMES A DAY ?Patient taking differently: Take 15 mg by mouth 3 (three) times daily. 10/02/21

## 2022-03-27 NOTE — H&P (Addendum)
? ? ? ?Date: 03/27/2022     ?     ?     ?Patient Name:  Derrick Hendrix. MRN: [id number]  ?DOB: 1941-04-05 Age / Sex: 81 y.o., male   ?PCP: [name], MD    ?     ?Medical Service: Internal Medicine Teaching Service    ?     ?Attending Physician: Dr. Aldine Contes, MD    ?First Contact: Dr. Rosezetta Schlatter, MD  Pager: [phone]  ?Second Contact: Dr. Iona Beard, MD Pager: [phone]  ?     ?After Hours (After 5p/  First Contact Pager: [phone]  ?weekends / holidays): Second Contact Pager: [phone]  ? ?Chief Complaint: Hyperkalemia  ? ?History of Present Illness:  ? ?Mr. Hendrix, Derrick. is a 81 year old male with a past medical history significant for nonoliguric CKD stage V with recurrent hyperkalemia, hypertension, hyperlipidemia, CVA with residual L sided weakness and L sided hemiballismus, Afib on eliquis, peripheral vascular disease status post right SFA intervention in 2018, type 2 diabetes complicated by peripheral neuropathy. ? ? ?Patient was seen at [facility] 4/11 after he was noted to be hyperkalemic during a previous clinic visit 3/17, with potassium elevated at 5.9.  Patient stated that he did not been taking his Veltassa as prescribed because it causes him to have loose stools when he is traveling via public transit.  At the end of the clinic visit patient's Veltassa was increased to twice daily dosing and he was scheduled for follow-up visit on 4/11.  Per EMR it appears patient was hesitant to increase his Veltassa.  On follow-up visit at [facility] on 4/11 patient was noted to have a potassium of 6.7.  Our clinic relayed to patient and his niece that he should present to the ED leading to today's admission.  He denied any muscle weakness, fatigue, or palpitations.  He does note ongoing constipation for which he takes Colace.  He denies headache, nausea vomiting, abdominal pain, shortness of breath, recent sick contacts, rash or skin breakdown. ? ? ?Meds:  ?Amlodipine 10 mg daily ?Eliquis 2 and half  milligrams twice daily ?Lasix 40 mg daily ?Gabapentin 100 mg daily ?Atorvastatin 80 mg daily ?Finasteride 5 mg daily ?Calcitriol 0.25 mcg ?Hydralazine 50 mg 3 times daily ?Prilosec 40 mg daily ?Sodium bicarbonate 1300 mg twice daily ?Flomax 0.8 mg daily ? ? ?Current Meds  ?Medication Sig  ? nitroGLYCERIN (NITROSTAT) 0.4 MG SL tablet Place 1 tablet (0.4 mg total) under the tongue every 5 (five) minutes x 3 doses as needed for chest pain.  ? VELTASSA 8.4 g packet Take 1 packet by mouth daily.  ? ? ? ?Allergies: ?Allergies as of 03/26/2022 - Review Complete 03/26/2022  ?Allergen Reaction Noted  ? Aspirin Other (See Comments) 08/24/2014  ? ?Past Medical History:  ?Diagnosis Date  ? Abscess of foot without toes, left 08/28/2017  ? Chronic kidney disease (CKD), stage III (moderate) (HCC)   ? Constipation 11/07/2014  ? Daily headache   ? "lately" (04/17/2016)  ? Depression   ? Dizziness 03/07/2016  ? Headache 06/26/2014  ? Heart murmur   ? Hyperkalemia 10/05/2018  ? Hyperlipemia 11/11/2016  ? Hypertension   ? Neuropathic pain 02/06/2015  ? Osteomyelitis ([place])   ? PAD (peripheral artery disease) ([place])   ? Paronychia of great toe, right   ? Preop cardiovascular exam   ? Right foot pain 02/09/2017  ? Stable angina ([place]) 06/27/2014  ? Stroke [facility]) 03/2016  ? hx of PAD; j"ust lots  of headaches since" (04/17/2016)  ? Vitamin B12 deficiency 06/27/2014  ? Weight loss, unintentional 06/27/2014  ? ? ?Family History:  ?Family History  ?Problem Relation Age of Onset  ? Hypertension Mother   ?     died age 17  ? Diabetes Mother   ? ? ? ?Social History:  ?Social History  ? ?Socioeconomic History  ? Marital status: Single  ?  Spouse name: Not on file  ? Number of children: Not on file  ? Years of education: Not on file  ? Highest education level: Not on file  ?Occupational History  ? Not on file  ?Tobacco Use  ? Smoking status: Former  ?  Years: 66.00  ?  Types: Cigarettes  ?  Start date: 06/23/2017  ? Smokeless tobacco: Never  ?Vaping Use  ? Vaping  Use: Never used  ?Substance and Sexual Activity  ? Alcohol use: Not Currently  ?  Comment: 04/17/2016 "nothing in the last 3-4 years"  ? Drug use: No  ? Sexual activity: Never  ?Other Topics Concern  ? Not on file  ?Social History Narrative  ? Works in the yard  ?   ? ?Social Determinants of Health  ? ?Financial Resource Strain: Not on file  ?Food Insecurity: No Food Insecurity  ? Worried About Charity fundraiser in the Last Year: Never true  ? Ran Out of Food in the Last Year: Never true  ?Transportation Needs: No Transportation Needs  ? Lack of Transportation (Medical): No  ? Lack of Transportation (Non-Medical): No  ?Physical Activity: Not on file  ?Stress: Not on file  ?Social Connections: Unknown  ? Frequency of Communication with Friends and Family: More than three times a week  ? Frequency of Social Gatherings with Friends and Family: More than three times a week  ? Attends Religious Services: Not on file  ? Active Member of Clubs or Organizations: Not on file  ? Attends Archivist Meetings: Not on file  ? Marital Status: Not on file  ?Intimate Partner Violence: Not on file  ? ? ? ?Review of Systems: ?A complete ROS was negative except as per HPI.  ? ?Physical Exam: ?Blood pressure 134/76, pulse (!) 39, temperature (!) 103.2 ?F (39.6 ?C), temperature source Rectal, resp. rate (!) 24, height '6\' 2"'$  (1.88 m), weight 79.3 kg, SpO2 96 %. ? ? ?Constitutional: Well-developed, well-nourished, having rigors  ?HENT:  ?Head: Normocephalic and atraumatic.  ?Eyes: EOM are normal.  ?Neck: Normal range of motion.  ?Cardiovascular: Tachycardic, regular rhythm, intact distal pulses. No gallop and no friction rub.  ?No murmur heard. No lower extremity edema  ?Pulmonary: Non labored breathing on room air, no wheezing or rales  ?Abdominal: Soft. Normal bowel sounds. Non distended and non tender ?Musculoskeletal: Normal range of motion.     ?   General: No tenderness or edema.  ?Neurological: Alert and oriented to  person, place, and time. Non focal  ?Skin: Skin is warm and dry.  ? ? ?EKG: personally reviewed my interpretation is ?No peaked T waves, some flattened P waves, no prolonged PR interval, no ST depressions and no prolonged QRS ? ?EKG 4/13 sinus tachycardia, no peaked T waves ? ?CXR:  Pending  ? ?Assessment & Plan by Problem: ?Principal Problem: ?  Hyperkalemia ? ?Mr. Eidan Muellner, Brooke Bonito. is a 81 year old male with a past medical history significant for nonoliguric CKD stage V with recurrent hyperkalemia, hypertension, hyperlipidemia, CVA with residual L sided weakness and L sided hemiballismus,  Afib on eliquis, peripheral vascular disease status post right SFA intervention in 2018, type 2 diabetes complicated by peripheral neuropathy who presents for hyperkalemia. ? ?Fever of unknown origin:  ?Leukopenia ?Metabolic acidosis  ?Admission vitals were notable for normotension with normal heart rate and oxygen saturations >95.  When patient was evaluated bedside he did state that he felt "really cold ".  Eventually a rectal temperature was obtained and was 103.2 ?F.  Patient was reassessed by on-call team at that time.  He denied any cough, sore throat, chest pain, shortness of breath, abdominal pain, dysuria.  At that time patient was tachycardic to the 110s.  Labs drawn at that time showed leukopenia with a white blood cell count of 1.0, stat repeat labs showed leukopenia to 2.4 and differential showed a lymphopenia, absolute lymphocyte count of 0.1. ? ?Unclear etiology of patient's fever and acutely decreased white blood cell count.  However given his fever to 103.2 ?F, lymphopenia, and acutely worsening respiratory status he could potentially have a viral respiratory illness though notably his only symptoms are acute increased oxygen requirement and fever.  Patient's chest x-ray was negative for consolidation or opacity.  In addition, blood cultures and urinalysis were obtained and patient was started on broad-spectrum  antibiotics. ?-Follow-up culture data, discontinue/narrow antibiotics as able ?-Follow-up COVID/flu ?-Follow up lactate  ?-F/u procalcitonin  ?-F/u smear review for leukopenia  ? ?Hyperkalemia:  ?Patient prese

## 2022-03-27 NOTE — ED Notes (Signed)
Dr. Carter at bedside.

## 2022-03-27 NOTE — ED Notes (Signed)
MD Eulas Post notified of pt BP - Keep MAP >65 ?

## 2022-03-27 NOTE — ED Notes (Signed)
MD notified of most recent temp  ?

## 2022-03-27 NOTE — Progress Notes (Addendum)
? ?Subjective: ? ?Patient states he has been feeling cold and having chills. Was unaware he had fever this morning, currently denying feeling hot or fevers. Discussed concerns for infection and recent lab results. Patient states his breathing is better today, not currently having cough, chest pain, SOB. ? ?Patient denies nausea/vomiting, abdominal or suprapubic pain, denies dysuria, frequency, difficulty urinating or changes to urine color. No other concerns at this time. ? ?Objective: ? ?Vital signs in last 24 hours: ?Vitals:  ? 03/27/22 0645 03/27/22 0736 03/27/22 0830 03/27/22 0945  ?BP:  109/63 110/90 105/60  ?Pulse: (!) 107 (!) 107 (!) 106 99  ?Resp: (!) 26 (!) 23 (!) 30 (!) 23  ?Temp:      ?TempSrc:      ?SpO2: 99% 98% 99% 97%  ?Weight:      ?Height:      ? ?Weight change:  ? ?Intake/Output Summary (Last 24 hours) at 03/27/2022 1116 ?Last data filed at 03/27/2022 1013 ?Gross per 24 hour  ?Intake 100 ml  ?Output --  ?Net 100 ml  ? ?Physical Exam: ?Constitutional: Elderly male with intermittent shivering in bed, NAD ?HEENT: normocephalic, atraumatic, moist mucus membranes, patient with poor dentition, no sores or bleeding gums, no pain in teeth, gums or jaw, no submandibular lymphadenopathy ?Cardio: tachycardic, normal rhythm, no murmurs ?Pulm: CTAB, No increased work of breathing on 2L [place], no wheezes or crackles ?Abd: soft, nontender, nondistended, +normal bowel sounds ?Extremities: no edema to bilateral LE, normal skin turgor, no pressure ulcers or wounds present on bilateral feet, dry flaky skin on feet. ?Skin: warm and dry, flaky skin throughout ?Neuro: alert and oriented x3, answering questions appropriately ?Psych: normal mood and affect ? ?Assessment/Plan: ? ?Principal Problem: ?  Hyperkalemia ? ?Mr. Huntley, Knoop. is a 81 year old male with a past medical history significant for nonoliguric CKD stage V with recurrent hyperkalemia, hypertension, hyperlipidemia, CVA with residual L sided weakness and L  sided hemiballismus, Afib on eliquis, peripheral vascular disease status post right SFA intervention in 2018, type 2 diabetes complicated by peripheral neuropathy who presented for hyperkalemia now complicated by fever, leukopenia, and tachycardia concerning for sepsis. ?  ?Sepsis 2/2 possible UTI ?Leukopenia ?Metabolic acidosis  ?Patient currently afebrile but remains tachycardic to the 100s, tachypnic to the 20s and hypotensive at 102/50 with continued concern for underlying sepsis. Procal wnl and lactic acid downtrending with initiation of antibiotics. He continues to have O2 requirement of 2L [place] this morning, without focal findings on lung exam and imaging, and with neg resp panel/COVID. Currently, low concern for respiratory cause as infectious source. Increased O2 requirement likely secondary to underlying infection. Patients UA positive for possible UTI, will obtain urine culture to confirm.  He continues to deny urinary symptoms such as n/v, dysuria, and frequency. No other sources of infection identified at this time; sepsis likely secondary to UTI. Patient will be transitioned off cefepime and flagyl with low concern for need for GI anaerobic and pseudomonal coverage. He will be started on Ceftriaxone and continue on vancomycin as below, will narrow antibiotics as able. Blood cultures currently pending. ?-D/c cefepime, flagyl ?-Continue ceftriaxone 1g and vancomycin '750mg'$ , narrow antibiotics as able ?-Follow-up blood and urine cultures ?-F/u smear review for leukopenia  ?  ?Hyperkalemia ?Patient admitted with hyperkalemia to 6.7 likely secondary to CKD stage V with no EKG changes. Hyperkalemia now normalized to 4.5 post 1x insulin and D50, calcium gluconate, IV Lasix 20 mg x 1 and Lokelma 10 g x 1. Discontinued Lokelma  at this time, consider restarting if needed. ?-d/c Lokelma, consider restarting if needed  ?-continue q4h potassium checks ?  ?CKD stage V (non oliguric) ?Per patient's niece he follows with  nephrology and was evaluated by vascular surgery.  We do not have access to these records.  Patient's niece states that he was told that his left upper remedy would not be suitable for AV fistula and patient did not want to entertain other sites. He remains nonoliguric with BUN elevated in the 90s and creatinine of 6.05. Patient appears to be chronically at this level and asymptomatic. Nephrology was consulted today to evaluate patient for need for dialysis in the future.  ?-Avoid nephrotoxic agents ?-Nephrology consulted, appreciate recs ? ?Paroxysmal atrial fibrillation ?Patient remains sinus tachycardia to the 100s.  Currently on anticoagulation with Eliquis for atrial fibrillation, no rate control. Will continue home meds. ?-Continue Eliquis 2.'5mg'$  BID ?  ?Hypertension: ?Patient remains hypotensive with most recent BP of 102/50 today likely in the setting of sepsis. Will continue to hold home amlodipine. ?-Hold home antihypertensives ?  ?Hyperlipidemia ?-Continue home atorvastatin ?  ?T2DM c/b peripheral neuropathy ?Patient with history of diabetes however most recent A1c within normal limits.  In addition patient is currently not on any antidiabetic medications. Glucose has been normal in 90s. On gabapentin 100 mg nightly for his neuropathic pain.  ?-Hold gabapentin '100mg'$  qhs ?  ?Chronic anemia, macrocytic ?Recent iron studies 3/31 are remarkable for iron deficiency.  Most likely related to chronic kidney disease. Vitamin B12 low-normal and folate wnl. ? ?PAD s/p R SFA nitinol stent in 2018 ?Most recent ABIs were indeterminant due to patient motion.  No concern for acute peripheral vascular disease. Continue medical management per above ? ?Hx CVA w/ residual L sided weakness ?Patient reports some residual left-sided weakness as well as some left-sided hemiballismus.  On exam his left upper extremity and left lower extremity strength were 5 out of 5.  It appears patient is currently not on a antiplatelet agent  but is on atorvastatin as above. ?  ?Diet: Renal ?Code: DNR ?DVT ppx: Eliquis ? ? LOS: 0 days  ? ?Rodriguez-Teodoro, Quintella Reichert, Medical Student ?03/27/2022, 11:16 AM  ?

## 2022-03-27 NOTE — ED Notes (Signed)
Pt desatting on room air - 88-89% - placed on 2L  ?

## 2022-03-27 NOTE — Consult Note (Signed)
Nephrology Consult  ? ?Assessment/Recommendations: Derrick Hendrix. is a/an 81 y.o. male with a past medical history notable for CKD5 (followed previously by Dr. Justin Mend), recurrent hyperkalemia on chronic Veltassa p/w hyperkalemia, course complicated by fever/leukopenia/tachycardia-concern for sepsis. ? ?CKD5, progressive  ?-patient has opted to not do HD in the past, discussed this again with patient today and his wishes still stand, would recommend palliative consultation, in the meantime will medically manage. Have also confirmed this with his niece, [name] at the request of the patient, who is involved in his care ?-Maintain MAP>65 for optimal renal perfusion.  ?-Avoid nephrotoxic medications including NSAIDs and iodinated intravenous contrast exposure unless the latter is absolutely indicated.  Preferred narcotic agents for pain control are hydromorphone, fentanyl, and methadone. Morphine should not be used. Avoid Baclofen and avoid oral sodium phosphate and magnesium citrate based laxatives / bowel preps. Continue strict Input and Output monitoring. Will monitor the patient closely with you and intervene or adjust therapy as indicated by changes in clinical status/labs  ? ?Recurrent Hyperkalemia ?-agree with lokelma-can restart if K trends up, likely a better choice for him in the long term as compared to veltassa. Per review of his records, seems that he does get frequent GI SE's from veltassa. On d/c would benefit from [facility] 10g every other day given recurrence of hyperK ? ?Metabolic acidosis ?-likely related to CKD w/ lactic acidosis ?-agree with nahco3 $RemoveBef'1300mg'bMzhtbfibc$ , will increased from BID to TID ? ?Fevers, leukopenia ?-on rocephin+vanc currently ?-per primary service ? ?Hypertension: ?-resume home meds ? ?Anemia due to CKD: ?-Transfuse for Hgb<7 g/dL ?-received epo 10k units qweekly as an outpatient ?-iron profile ordered for AM ? ?Diabetes Mellitus Type 2 ?-per primary service ? ?Recommendations conveyed to  primary service.  ? ?Alyssah Algeo Candiss Norse ?[facility] ?03/27/2022 ?1:15 PM ? ? ?_____________________________________________________________________________________ ? ? ?History of Present Illness: Derrick Hendrix. is a/an 81 y.o. male with a past medical history of CKD5 (followed by Dr. Justin Mend previously), recurrent hyperkalemia, hypertension, history of CVA, A-fib, PVD, DM 2, hyperlipidemia who presents to [facility] with hyperkalemia.  He is on chronic Veltassa however was not taking this as prescribed, was found to have potassium 6.7 hence was sent to the ER.  Potassium is improved with Lokelma.  Has also been found to have fever, etiology of potential infection is unknown, also has leukopenia.  Patient was transitioned to IV ceftriaxone for possible urinary infection. ?Was last seen in our office in March 2022, his creatinine at that time was 4.7 with a EGFR of 12. Per discussions in the past at the office, he opted not to do dialysis and apparently had established care with palliative care. HD was also thought to be challenging for him given his chorea. I have again confirmed this with him today and he still wishes to not do renal replacement therapy. Have also discussed this with [name] (his niece) at the request of the patient and she confirms the same. [name] does report that palliative care was supposed to see him as an outpatient but has not been happening. ?He does complain of feeling worn out and wants to get some sleep since he did not get any sleep last night. He otherwise denies any fevers, chills, chest pain, SOB, swelling currently. ? ? ?Medications:  ?Current Facility-Administered Medications  ?Medication Dose Route Frequency Provider Last Rate Last Admin  ? acetaminophen (TYLENOL) tablet 650 mg  650 mg Oral Q6H PRN Rick Duff, MD   650 mg at 03/27/22 0001  ?  Or  ? acetaminophen (TYLENOL) suppository 650 mg  650 mg Rectal Q6H PRN Rick Duff, MD      ? apixaban Arne Cleveland) tablet 2.5 mg   2.5 mg Oral BID Rick Duff, MD   2.5 mg at 03/27/22 0911  ? atorvastatin (LIPITOR) tablet 80 mg  80 mg Oral Daily Rick Duff, MD   80 mg at 03/27/22 7673  ? calcitRIOL (ROCALTROL) capsule 0.25 mcg  0.25 mcg Oral Daily Rick Duff, MD   0.25 mcg at 03/27/22 0908  ? cefTRIAXone (ROCEPHIN) 1 g in sodium chloride 0.9 % 100 mL IVPB  1 g Intravenous Q24H Rosezetta Schlatter, MD      ? finasteride (PROSCAR) tablet 5 mg  5 mg Oral Daily Rick Duff, MD   5 mg at 03/27/22 4193  ? hydrALAZINE (APRESOLINE) tablet 15 mg  15 mg Oral TID Rick Duff, MD   15 mg at 03/27/22 7902  ? pantoprazole (PROTONIX) EC tablet 40 mg  40 mg Oral Daily Rick Duff, MD   40 mg at 03/27/22 4097  ? sodium bicarbonate tablet 1,300 mg  1,300 mg Oral BID Rick Duff, MD   1,300 mg at 03/27/22 0911  ? tamsulosin (FLOMAX) capsule 0.8 mg  0.8 mg Oral Daily Rick Duff, MD   0.8 mg at 03/27/22 3532  ? [START ON 03/29/2022] vancomycin (VANCOREADY) IVPB 750 mg/150 mL  750 mg Intravenous Q48H Bertis Ruddy, RPH      ? ?Current Outpatient Medications  ?Medication Sig Dispense Refill  ? amLODipine (NORVASC) 10 MG tablet Take 1 tablet (10 mg total) by mouth daily. 90 tablet 1  ? apixaban (ELIQUIS) 2.5 MG TABS tablet TAKE ONE TABLET BY MOUTH TWICE A DAY (Patient taking differently: Take 2.5 mg by mouth 2 (two) times daily.) 180 tablet 0  ? atorvastatin (LIPITOR) 80 MG tablet TAKE ONE TABLET BY MOUTH DAILY (Patient taking differently: Take 80 mg by mouth daily.) 90 tablet 4  ? finasteride (PROSCAR) 5 MG tablet TAKE ONE TABLET BY MOUTH DAILY (Patient taking differently: Take 5 mg by mouth daily.) 90 tablet 4  ? furosemide (LASIX) 40 MG tablet TAKE ONE TABLET BY MOUTH TWICE A DAY (Patient taking differently: Take 40 mg by mouth 2 (two) times daily.) 90 tablet 4  ? gabapentin (NEURONTIN) 100 MG capsule Take 1 capsule (100 mg total) by mouth at bedtime. 90 capsule 0  ? hydrALAZINE (APRESOLINE) 10 MG tablet TAKE 1.5  TABLETS BY MOUTH THREE TIMES A DAY (Patient taking differently: Take 15 mg by mouth 3 (three) times daily.) 135 tablet 3  ? nitroGLYCERIN (NITROSTAT) 0.4 MG SL tablet Place 1 tablet (0.4 mg total) under the tongue every 5 (five) minutes x 3 doses as needed for chest pain. 30 tablet 3  ? omeprazole (PRILOSEC) 40 MG capsule TAKE ONE CAPSULE SHORTLY BEFORE BREAKFAST EACH MORNING (Patient taking differently: Take 40 mg by mouth daily.) 30 capsule 10  ? sodium bicarbonate 650 MG tablet TAKE TWO TABLETS BY MOUTH TWICE A DAY (Patient taking differently: Take 1,300 mg by mouth 2 (two) times daily.) 120 tablet 3  ? tamsulosin (FLOMAX) 0.4 MG CAPS capsule TAKE TWO CAPSULES BY MOUTH DAILY (Patient taking differently: Take 0.8 mg by mouth daily.) 180 capsule 4  ? Blood Pressure Monitoring (BLOOD PRESSURE KIT) DEVI Please check blood pressure twice a day. In the morning and evening. Please record numbers on a sheet of paper. Blood pressure parameters are 120-150/80-90 1 each 0  ? clobetasol cream (TEMOVATE) 0.05 % APPLY  TOPICALLY TWO (TWO) TIMES DAILY. (Patient not taking: Reported on 03/27/2022) 60 g 0  ? docusate sodium (COLACE) 100 MG capsule Take 1 capsule (100 mg total) by mouth daily. (Patient not taking: Reported on 03/27/2022) 90 capsule 3  ? VELTASSA 8.4 g packet Take 1 packet by mouth daily. (Patient not taking: Reported on 03/27/2022)    ?  ? ?ALLERGIES ?Aspirin ? ?MEDICAL HISTORY ?Past Medical History:  ?Diagnosis Date  ? Abscess of foot without toes, left 08/28/2017  ? Chronic kidney disease (CKD), stage III (moderate) (HCC)   ? Constipation 11/07/2014  ? Daily headache   ? "lately" (04/17/2016)  ? Depression   ? Dizziness 03/07/2016  ? Headache 06/26/2014  ? Heart murmur   ? Hyperkalemia 10/05/2018  ? Hyperlipemia 11/11/2016  ? Hypertension   ? Neuropathic pain 02/06/2015  ? Osteomyelitis ([place])   ? PAD (peripheral artery disease) ([place])   ? Paronychia of great toe, right   ? Preop cardiovascular exam   ? Right foot pain  02/09/2017  ? Stable angina ([place]) 06/27/2014  ? Stroke [facility]) 03/2016  ? hx of PAD; j"ust lots of headaches since" (04/17/2016)  ? Vitamin B12 deficiency 06/27/2014  ? Weight loss, unintentional 06/27/2014  ?  ? ?S

## 2022-03-27 NOTE — ED Notes (Signed)
MD notified of critical lactic  

## 2022-03-27 NOTE — Progress Notes (Signed)
Pharmacy Antibiotic Note ? ?Derrick Hendrix. is a 81 y.o. male admitted on 03/26/2022 with hyperkalemia and concern for  febrile neutropenia .  Pharmacy has been consulted for vancomycin and cefepime dosing with plan to change cefepime to ceftriaxone if repeat CBCs do not confirm neutropenia.  Of note pt has nonoliguric CKD stage V and is not on renal replacement therapy. ? ?Plan: ?Vancomycin '1750mg'$  x1 then '750mg'$  IV Q48H. Goal AUC 400-550.  Expected AUC 500.  SCr used 6.  ?Cefepime 2g IV x1 and f/u CBC prior to redosing ? ?Height: '6\' 2"'$  (188 cm) ?Weight: 79.3 kg (174 lb 13.2 oz) ?IBW/kg (Calculated) : 82.2 ? ?Temp (24hrs), Avg:100.7 ?F (38.2 ?C), Min:98.2 ?F (36.8 ?C), Max:103.2 ?F (39.6 ?C) ? ?Recent Labs  ?Lab 03/25/22 ?1012 03/26/22 ?1733 03/26/22 ?1741 03/27/22 ?0867 03/27/22 ?6195  ?WBC  --   --  6.6  --  1.0*  ?CREATININE 5.84* 6.01*  --   --  6.12*  ?LATICACIDVEN  --   --   --  2.8*  --   ?  ?Estimated Creatinine Clearance: 10.3 mL/min (A) (by C-G formula based on SCr of 6.12 mg/dL (H)).   ? ?Allergies  ?Allergen Reactions  ? Aspirin Other (See Comments)  ?  Nosebleeds   ? ? ? ?Thank you for allowing pharmacy to be a part of this patient?s care. ? ?Wynona Neat, PharmD, BCPS  ?03/27/2022 1:32 AM ? ?

## 2022-03-28 ENCOUNTER — Encounter (HOSPITAL_COMMUNITY): Payer: Medicare HMO

## 2022-03-28 DIAGNOSIS — N390 Urinary tract infection, site not specified: Secondary | ICD-10-CM | POA: Diagnosis not present

## 2022-03-28 DIAGNOSIS — E875 Hyperkalemia: Secondary | ICD-10-CM | POA: Diagnosis not present

## 2022-03-28 DIAGNOSIS — B9620 Unspecified Escherichia coli [E. coli] as the cause of diseases classified elsewhere: Secondary | ICD-10-CM

## 2022-03-28 DIAGNOSIS — Z7189 Other specified counseling: Secondary | ICD-10-CM | POA: Diagnosis not present

## 2022-03-28 DIAGNOSIS — A419 Sepsis, unspecified organism: Secondary | ICD-10-CM

## 2022-03-28 LAB — CBC WITH DIFFERENTIAL/PLATELET
Abs Immature Granulocytes: 0 10*3/uL (ref 0.00–0.07)
Basophils Absolute: 0 10*3/uL (ref 0.0–0.1)
Basophils Relative: 0 %
Eosinophils Absolute: 0 10*3/uL (ref 0.0–0.5)
Eosinophils Relative: 0 %
HCT: 32.1 % — ABNORMAL LOW (ref 39.0–52.0)
Hemoglobin: 9.8 g/dL — ABNORMAL LOW (ref 13.0–17.0)
Lymphocytes Relative: 0 %
Lymphs Abs: 0 10*3/uL — ABNORMAL LOW (ref 0.7–4.0)
MCH: 30.3 pg (ref 26.0–34.0)
MCHC: 30.5 g/dL (ref 30.0–36.0)
MCV: 99.4 fL (ref 80.0–100.0)
Monocytes Absolute: 0.6 10*3/uL (ref 0.1–1.0)
Monocytes Relative: 2 %
Neutro Abs: 29.7 10*3/uL — ABNORMAL HIGH (ref 1.7–7.7)
Neutrophils Relative %: 98 %
Platelets: 208 10*3/uL (ref 150–400)
RBC: 3.23 MIL/uL — ABNORMAL LOW (ref 4.22–5.81)
RDW: 15.9 % — ABNORMAL HIGH (ref 11.5–15.5)
WBC: 30.3 10*3/uL — ABNORMAL HIGH (ref 4.0–10.5)
nRBC: 0 /100 WBC
nRBC: 0.1 % (ref 0.0–0.2)

## 2022-03-28 LAB — IRON AND TIBC
Iron: 9 ug/dL — ABNORMAL LOW (ref 45–182)
Saturation Ratios: 4 % — ABNORMAL LOW (ref 17.9–39.5)
TIBC: 220 ug/dL — ABNORMAL LOW (ref 250–450)
UIBC: 211 ug/dL

## 2022-03-28 LAB — BASIC METABOLIC PANEL
Anion gap: 13 (ref 5–15)
BUN: 104 mg/dL — ABNORMAL HIGH (ref 8–23)
CO2: 16 mmol/L — ABNORMAL LOW (ref 22–32)
Calcium: 7.3 mg/dL — ABNORMAL LOW (ref 8.9–10.3)
Chloride: 108 mmol/L (ref 98–111)
Creatinine, Ser: 6.64 mg/dL — ABNORMAL HIGH (ref 0.61–1.24)
GFR, Estimated: 8 mL/min — ABNORMAL LOW (ref >60)
Glucose, Bld: 69 mg/dL — ABNORMAL LOW (ref 70–99)
Potassium: 5 mmol/L (ref 3.5–5.1)
Sodium: 137 mmol/L (ref 135–145)

## 2022-03-28 LAB — GLUCOSE, CAPILLARY
Glucose-Capillary: 100 mg/dL — ABNORMAL HIGH (ref 70–99)
Glucose-Capillary: 66 mg/dL — ABNORMAL LOW (ref 70–99)

## 2022-03-28 LAB — CBC
HCT: 32.3 % — ABNORMAL LOW (ref 39.0–52.0)
Hemoglobin: 9.9 g/dL — ABNORMAL LOW (ref 13.0–17.0)
MCH: 30.3 pg (ref 26.0–34.0)
MCHC: 30.7 g/dL (ref 30.0–36.0)
MCV: 98.8 fL (ref 80.0–100.0)
Platelets: 202 10*3/uL (ref 150–400)
RBC: 3.27 MIL/uL — ABNORMAL LOW (ref 4.22–5.81)
RDW: 15.9 % — ABNORMAL HIGH (ref 11.5–15.5)
WBC: 29.8 10*3/uL — ABNORMAL HIGH (ref 4.0–10.5)
nRBC: 0.1 % (ref 0.0–0.2)

## 2022-03-28 LAB — PROCALCITONIN: Procalcitonin: >150 ng/mL

## 2022-03-28 LAB — FERRITIN: Ferritin: 330 ng/mL (ref 24–336)

## 2022-03-28 MED ORDER — RENA-VITE PO TABS
1.0000 | ORAL_TABLET | Freq: Every day | ORAL | Status: DC
  Administered 2022-03-28 – 2022-03-30 (×3): 1 via ORAL
  Filled 2022-03-28 (×3): qty 1

## 2022-03-28 MED ORDER — CAMPHOR-MENTHOL 0.5-0.5 % EX LOTN
TOPICAL_LOTION | CUTANEOUS | Status: DC | PRN
  Filled 2022-03-28: qty 222

## 2022-03-28 MED ORDER — WHITE PETROLATUM EX OINT
TOPICAL_OINTMENT | CUTANEOUS | Status: DC | PRN
  Filled 2022-03-28: qty 28.35

## 2022-03-28 MED ORDER — SODIUM CHLORIDE 0.9 % IV SOLN
250.0000 mg | Freq: Every day | INTRAVENOUS | Status: DC
  Administered 2022-03-28: 250 mg via INTRAVENOUS
  Filled 2022-03-28: qty 250

## 2022-03-28 MED ORDER — NEPRO/CARBSTEADY PO LIQD
237.0000 mL | Freq: Two times a day (BID) | ORAL | Status: DC
  Administered 2022-03-28 – 2022-03-31 (×6): 237 mL via ORAL

## 2022-03-28 MED ORDER — SODIUM CHLORIDE 0.9 % IV SOLN
2.0000 g | INTRAVENOUS | Status: DC
  Administered 2022-03-29 – 2022-03-31 (×3): 2 g via INTRAVENOUS
  Filled 2022-03-28 (×3): qty 20

## 2022-03-28 MED ORDER — SODIUM CHLORIDE 0.9 % IV SOLN
1.0000 g | Freq: Once | INTRAVENOUS | Status: AC
  Administered 2022-03-28: 1 g via INTRAVENOUS
  Filled 2022-03-28: qty 10

## 2022-03-28 NOTE — Progress Notes (Addendum)
? ?Subjective: ON, patient with alarming labs: WBC increased to 29.8, procalcitonin >150. ? ?This AM, patient reports sleeping fairly well, as well as nausea and itching, but denies urinary symptoms, abdominal pain, chest pain, dyspnea.  He reports no acute concerns or complaints today. ? ?Objective: ? ?Vital signs in last 24 hours: ?Vitals:  ? 03/28/22 0028 03/28/22 2542 03/28/22 0606 03/28/22 0926  ?BP: (!) 86/50 112/68 119/65 (!) 101/58  ?Pulse:  (!) 105 98 93  ?Resp:  '20 18 17  '$ ?Temp:  98.2 ?F (36.8 ?C) 97.7 ?F (36.5 ?C) 98.1 ?F (36.7 ?C)  ?TempSrc:  Oral Oral   ?SpO2:  98% 100% 100%  ?Weight:      ?Height:      ? ?Physical Exam: ?Constitutional: Elderly male with intermittent shivering in bed, NAD ?HEENT: normocephalic, atraumatic, moist mucus membranes, patient with poor dentition ?Cardio: Normal rate and rhythm, no murmurs, rubs, gallops. ?Pulm: CTAB, No increased work of breathing on 2L [place], no wheezes or crackles ?Abd: soft, nontender, nondistended, +normal bowel sounds ?Skin: warm and dry, flaky skin throughout ?Neuro: alert and oriented x3, answering questions appropriately ?Psych: normal mood and affect ?  ?Assessment/Plan: ?  ?Principal Problem: ?  Hyperkalemia ?Other Diagnoses: ?Sepsis ([place]) ?Urinary tract infection, E. coli ?  ?Mr. Cartez, Mogle. is a 81 year old male with a past medical history significant for nonoliguric CKD stage V with recurrent hyperkalemia, hypertension, hyperlipidemia, CVA with residual L sided weakness and L sided hemiballismus, Afib on eliquis, peripheral vascular disease status post right SFA intervention in 2018, type 2 diabetes complicated by peripheral neuropathy who presented for hyperkalemia now complicated by fever, leukopenia, and tachycardia concerning for sepsis. ?  ?#Sepsis 2/2 UTI ?Patient currently afebrile and without tachycardia but remains hypotensive at 101/58 with continued concern for underlying sepsis. Procal elevated > 150, WBC 29.8. He continues to  have O2 requirement of 2L [place] this morning, without focal findings on lung exam and imaging, and with neg resp panel/COVID.  Blood smear with neutropenia, no blasts.  Patient UA positive for possible UTI, and urine culture with greater than 100,000 E. coli.  He endorses nausea today but continues to deny urinary symptoms such as dysuria, urgency, and frequency. No other sources of infection identified at this time; sepsis likely secondary to UTI. Patient now on ceftriaxone, will narrow antibiotics as able. Blood cultures currently with no growth x1 day. ?-Increase to ceftriaxone 2g, narrow antibiotics as able ?-Follow-up blood cultures ?  ?#CKD stage V (non oliguric) ?Per patient's niece he follows with nephrology and was evaluated by vascular surgery.  We do not have access to these records.  Patient's niece states that he was told that his left upper remedy would not be suitable for AV fistula and patient did not want to entertain other sites. He remains nonoliguric with BUN elevated in the 90s and creatinine of 6.05. Patient appears to be chronically at this level and asymptomatic. Nephrology was consulted today to evaluate patient for need for dialysis in the future.  ?-Avoid nephrotoxic agents ?-Nephrology consulted, appreciate recs ?  ?#Paroxysmal atrial fibrillation ?Patient no longer tachycardic.  Currently on anticoagulation with Eliquis for atrial fibrillation. Will continue home meds. ?-Continue Eliquis 2.'5mg'$  BID ? ?#Hypotension, acute ?#Hx of hypertension ?Patient remains hypotensive with most recent BP of 101/58 today likely in the setting of sepsis. Will continue to hold home amlodipine and hydralazine. ?-Hold home antihypertensives ? ?#Chronic anemia, macrocytic ?Recent iron studies 3/31 are remarkable for iron deficiency.  Most likely  related to chronic kidney disease. Vitamin B12 low-normal and folate wnl. ?- CTM on daily CBC ?-We will iron infusion at this time due to active infection ?   ?#Hyperlipidemia ?-Continue home atorvastatin ?  ?#T2DM c/b peripheral neuropathy ?Patient with history of diabetes however most recent A1c within normal limits.  In addition patient is currently not on any antidiabetic medications. Glucose has been normal in 90s- 100s. Home med: gabapentin 100 mg nightly for his neuropathic pain.  ?-Hold gabapentin '100mg'$  qhs ?  ?  ?#PAD s/p R SFA nitinol stent in 2018 ?Most recent ABIs were indeterminant due to patient motion.  No concern for acute peripheral vascular disease. Continue medical management per above ?  ?#Hx CVA w/ residual L sided weakness ?Patient reports some residual left-sided weakness as well as some left-sided hemiballismus.  On exam his left upper extremity and left lower extremity strength were 5 out of 5.  It appears patient is currently not on a antiplatelet agent but is on atorvastatin as above. ? ?Prior to Admission Living Arrangement: Home ?Anticipated Discharge Location: Pending ?Barriers to Discharge: Clinical improvement ?Dispo: Anticipated discharge in approximately 3-5 day(s).  ? Rosezetta Schlatter, MD ?03/28/2022, 10:19 AM ?Pager: [phone] ?After 5pm on weekdays and 1pm on weekends: On Call pager [phone]  ?

## 2022-03-28 NOTE — Progress Notes (Signed)
Nephrology Follow-Up Consult note ? ? ?Assessment/Recommendations: Derrick Hendrix. is a/an 81 y.o. male with a past medical history significant for CKD5 (followed previously by Dr. Justin Mend), recurrent hyperkalemia on chronic [place] p/w hyperkalemia, course complicated by fever/leukopenia/tachycardia-concern for sepsis. ?  ?CKD5, progressive  ?-patient has opted to not do HD in the past. This was discussed with patient and [name] on 4/13.   ?-His Crt continues to be slightly worse and he may have some uremia today ?-Palliative care is involved ?-Care remains supportive with treatment of his underlying infection ?-I do not think we need to remain involved. I would continue with supportive care and consider transition to a more comfort based approach based on his progress. ?-Maintain MAP>65 for optimal renal perfusion.  ?-Avoid nephrotoxic medications including NSAIDs and iodinated intravenous contrast exposure unless the latter is absolutely indicated.  Preferred narcotic agents for pain control are hydromorphone, fentanyl, and methadone. Morphine should not be used. Avoid Baclofen and avoid oral sodium phosphate and magnesium citrate based laxatives / bowel preps. Continue strict Input and Output monitoring. Will monitor the patient closely with you and intervene or adjust therapy as indicated by changes in clinical status/labs  ?  ?Recurrent Hyperkalemia ?-continue lokelma. Has improved ?  ?Metabolic acidosis ?-likely related to CKD w/ lactic acidosis ?-cont w/ Na bicarb as dosed ?  ?Fevers, leukopenia ?-w/u for source. Abx per primary ?-per primary service ?  ?Hypertension: ?-hold home meds ?  ?Anemia due to CKD: ?-Transfuse for Hgb<7 g/dL ?-received epo 10k units qweekly as an outpatient ?-iron profile with deficiency but holding given possible infection ?-if infection improves could dose with total of 1g of IV iron ?  ?Diabetes Mellitus Type 2 ?-per primary service ? ?We will sign off at this time  given there is minimal intervention progress to help with.  Please let us know if further help is needed. ? ? ?Recommendations conveyed to primary service.  ? ? ?Reesa Chew ?[facility] ?03/28/2022 ?10:11 AM ? ?___________________________________________________________ ? ?CC: AKI, CKD V ? ?Interval History/Subjective: Patient is resting in bed with no specific complaints today.  Creatinine slightly worse today. ? ? ?Medications:  ?Current Facility-Administered Medications  ?Medication Dose Route Frequency Provider Last Rate Last Admin  ? acetaminophen (TYLENOL) tablet 650 mg  650 mg Oral Q6H PRN Rick Duff, MD   650 mg at 03/27/22 0001  ? Or  ? acetaminophen (TYLENOL) suppository 650 mg  650 mg Rectal Q6H PRN Rick Duff, MD      ? apixaban Arne [place]) tablet 2.5 mg  2.5 mg Oral BID Rick Duff, MD   2.5 mg at 03/28/22 0944  ? atorvastatin (LIPITOR) tablet 80 mg  80 mg Oral Daily Rick Duff, MD   80 mg at 03/28/22 0944  ? calcitRIOL (ROCALTROL) capsule 0.25 mcg  0.25 mcg Oral Daily Rick Duff, MD   0.25 mcg at 03/28/22 0944  ? camphor-menthol (SARNA) lotion   Topical PRN Rosezetta Schlatter, MD      ? cefTRIAXone (ROCEPHIN) 1 g in sodium chloride 0.9 % 100 mL IVPB  1 g Intravenous Q24H Rosezetta Schlatter, MD   Stopped at 03/27/22 1425  ? ferric gluconate (FERRLECIT) 250 mg in sodium chloride 0.9 % 250 mL IVPB  250 mg Intravenous Daily Reesa Chew, MD 135 mL/hr at 03/28/22 0956 250 mg at 03/28/22 0956  ? finasteride (PROSCAR) tablet 5 mg  5 mg Oral Daily Rick Duff, MD   5 mg at 03/28/22 0944  ?  hydrALAZINE (APRESOLINE) tablet 15 mg  15 mg Oral TID Rick Duff, MD   15 mg at 03/27/22 2223  ? melatonin tablet 5 mg  5 mg Oral QHS Cooper, Josseline P, PA-C   5 mg at 03/27/22 2224  ? pantoprazole (PROTONIX) EC tablet 40 mg  40 mg Oral Daily Rick Duff, MD   40 mg at 03/28/22 0944  ? sodium bicarbonate tablet 1,300 mg  1,300 mg Oral TID Gean Quint,  MD   1,300 mg at 03/28/22 0944  ? tamsulosin (FLOMAX) capsule 0.8 mg  0.8 mg Oral Daily Rick Duff, MD   0.8 mg at 03/28/22 0944  ? [START ON 03/29/2022] vancomycin (VANCOREADY) IVPB 750 mg/150 mL  750 mg Intravenous Q48H Bertis Ruddy, RPH      ? white petrolatum (VASELINE) gel   Topical PRN Rosezetta Schlatter, MD      ?  ? ? ?Review of Systems: ?10 systems reviewed and negative except per interval history/subjective ? ?Physical Exam: ?Vitals:  ? 03/28/22 0606 03/28/22 0926  ?BP: 119/65 (!) 101/58  ?Pulse: 98 93  ?Resp: 18 17  ?Temp: 97.7 ?F (36.5 ?C) 98.1 ?F (36.7 ?C)  ?SpO2: 100% 100%  ? ?Total I/O ?In: 240 [P.O.:240] ?Out: -  ? ?Intake/Output Summary (Last 24 hours) at 03/28/2022 1011 ?Last data filed at 03/28/2022 0900 ?Gross per 24 hour  ?Intake 800 ml  ?Output 560 ml  ?Net 240 ml  ? ?Constitutional: Chronically ill-appearing, lying in bed, no distress ?ENMT: ears and nose without scars or lesions, poor dentition ?CV: Tachycardic, no edema ?Respiratory: clear to auscultation, normal work of breathing ?Gastrointestinal: soft, non-tender, no palpable masses or hernias ?Skin: no visible lesions or rashes ?Psych: alert, difficulties answering questions, appropriate mood and affect ? ? ?Test Results ?I personally reviewed new and old clinical labs and radiology tests ?Lab Results  ?Component Value Date  ? NA 137 03/28/2022  ? K 5.0 03/28/2022  ? CL 108 03/28/2022  ? CO2 16 (L) 03/28/2022  ? BUN 104 (H) 03/28/2022  ? CREATININE 6.64 (H) 03/28/2022  ? CALCIUM 7.3 (L) 03/28/2022  ? ALBUMIN 3.6 03/27/2022  ? PHOS 5.9 (H) 03/27/2022  ? ? ?CBC ?Recent Labs  ?Lab 03/26/22 ?1741 03/27/22 ?0881 03/27/22 ?0131 03/28/22 ?0256  ?WBC 6.6 1.0* 2.4* 30.3*  29.8*  ?NEUTROABS 5.1  --  2.3 29.7*  ?HGB 10.6* 10.3* 9.6* 9.8*  9.9*  ?HCT 35.0* 33.8* 32.0* 32.1*  32.3*  ?MCV 101.7* 101.5* 101.9* 99.4  98.8  ?PLT 289 227 222 208  202  ? ? ? ? ? ?

## 2022-03-28 NOTE — Progress Notes (Signed)
Hypoglycemic Event ? ?CBG: 66 mg/dL @ 06:32 ? ?Treatment: 8 oz juice/soda ? ?Symptoms:  pt. Verbalized not feeling good ? ?Follow-up CBG: Time:0702 CBG Result:100 mg/dL ? ?Possible Reasons for Event: Inadequate meal intake ? ?Comments/MD notified:Yes ? ? ? ?Babs Sciara ? ? ?

## 2022-03-28 NOTE — Progress Notes (Signed)
Initial Nutrition Assessment ? ?DOCUMENTATION CODES:  ?Not applicable ? ?INTERVENTION:  ?-Nepro Shake po BID, each supplement provides 425 kcal and 19 grams protein ?-renavite daily ?-attached CKD stage 5 for people not pursuing dialysis diet education to discharge summary ? ?NUTRITION DIAGNOSIS:  ?Inadequate oral intake related to poor appetite as evidenced by meal completion < 50%. ? ?GOAL:  ?Patient will meet greater than or equal to 90% of their needs ? ?MONITOR:  ?PO intake, Supplement acceptance, Labs, Weight trends, I & O's ? ?REASON FOR ASSESSMENT:  ?Malnutrition Screening Tool ?  ? ?ASSESSMENT:  ?Pt with PMH significant for nonoliguric CKD stage V w/ recurrent hyperkalemia, HTN, HLD, CVA w/ residual L-sided weakness and L0sided hemiballismus, Afib, PVD s/p R SFA intervention in 1245, type 2 DM complicated by peripheral neuropathy admitted with hyperkalemia and sepsis 2/2 possible UTI. ? ?Pt unavailable at time of RD visit. Unable to obtain diet/weight history and nutrition-focused physical exam at this time. Will attempt at follow-up. Of note, PMT met with pt and stated pt with no interest in pursuing dialysis, but also not ready to transition to comfort-focused care at this time given he feels his QOL is adequate. PMT planning to follow-up with pt for ongoing conversations regarding [place].  ? ?PO Intake: 25% x 1 recorded meal   ? ?UOP: 730m x24 hours ?I/O: +666msince admit ? ?Medications: ? calcitRIOL  0.25 mcg Oral Daily  ? pantoprazole  40 mg Oral Daily  ? sodium bicarbonate  1,300 mg Oral TID  ?IV abx ? ?Labs: ?Recent Labs  ?Lab 03/26/22 ?1741 03/26/22 ?2011 03/27/22 ?0080994/13/23 ?0758 03/27/22 ?1800 03/28/22 ?0256  ?NA  --   --  137 137 137 137  ?K  --    < > 4.6 4.5 4.9 5.0  ?CL  --   --  111 111 108 108  ?CO2  --   --  14* 15* 12* 16*  ?BUN  --   --  94* 91* 99* 104*  ?CREATININE  --   --  6.12* 6.05* 6.49* 6.64*  ?CALCIUM  --   --  8.1* 7.8* 7.7* 7.3*  ?MG 2.7*  --   --   --   --   --   ?PHOS  --    --  5.9*  --   --   --   ?GLUCOSE  --   --  71 95 90 69*  ? < > = values in this interval not displayed.  ?Iron 9 ?Hgb 9.8, 9.9 ?CBGs: 66-100 x24 hours ? ?Diet Order:   ?Diet Order   ? ?       ?  Diet renal with fluid restriction Fluid restriction: 1500 mL Fluid; Room service appropriate? Yes; Fluid consistency: Thin  Diet effective now       ?  ? ?  ?  ? ?  ? ?EDUCATION NEEDS:  ?Education needs have been addressed ? ?Skin:  Skin Assessment: Reviewed RN Assessment ? ?Last BM:  4/11 ? ?Height:  ?Ht Readings from Last 1 Encounters:  ?03/27/22 _0  (1.88 m)  ? ?Weight:  ?Wt Readings from Last 1 Encounters:  ?03/27/22 77.1 kg  ? ?BMI:  Body mass index is 21.82 kg/m?. ? ?Estimated Nutritional Needs:  ?Kcal:  1900-2100 ?Protein:  80-95 grams ?Fluid:  1.5L per MD ? ? ?AmTheone Stanley MS, RD, LDN (she/her/hers) ?RD pager number and weekend/on-call pager number located in [facility]?

## 2022-03-28 NOTE — Progress Notes (Signed)
? ?                                                                                                                                                     ?                                                   ?Daily Progress Note  ? ?Patient Name: Derrick Hendrix.       Date: 03/28/2022 ?DOB: Aug 25, 1941  Age: 81 y.o. MRN#: [id number] ?Attending Physician: Aldine Contes, MD ?Primary Care Physician: [name], MD ?Admit Date: 03/26/2022 ? ?Reason for Consultation/Follow-up: Establishing goals of care ? ?Subjective: ?Medical records reviewed. Patient assessed at the bedside. He states that he feels "pretty good." No family present during my visit. ? ?Created space and opportunity for patient's thoughts and feelings on his current illness. He does not have much to say and did not protest to this PA reviewing his current illness. Attempted to continue our initial discussion, emphasizing the importance of ensuring that care is aligned with his goals and preferences, however patient then quickly becomes distressed and restless. He states "every time you talk about this I get upset." Emotional support and reassurance provided. Patient states "I don't want to talk about anything." ? ?Offered to reach out to family to provide support and discuss overall plan of care and treatment options. Patient is ambivalent and states "I don't know how that will help them." ? ?Questions and concerns addressed. PMT will continue to support holistically.  ? ?Length of Stay: 1 ? ?Current Medications: ?Scheduled Meds:  ? apixaban  2.5 mg Oral BID  ? atorvastatin  80 mg Oral Daily  ? calcitRIOL  0.25 mcg Oral Daily  ? feeding supplement (NEPRO CARB STEADY)  237 mL Oral BID BM  ? finasteride  5 mg Oral Daily  ? melatonin  5 mg Oral QHS  ? multivitamin  1 tablet Oral QHS  ? pantoprazole  40 mg Oral Daily  ? sodium bicarbonate  1,300 mg Oral TID  ? tamsulosin  0.8 mg Oral Daily  ? ? ?Continuous Infusions: ? [START ON 03/29/2022] cefTRIAXone  (ROCEPHIN)  IV    ? ? ?PRN Meds: ?acetaminophen **OR** acetaminophen, camphor-menthol, white petrolatum ? ?Physical Exam ?Vitals and nursing note reviewed.  ?Constitutional:   ?   Appearance: He is ill-appearing.  ?   Interventions: Nasal cannula in place.  ?Cardiovascular:  ?   Rate and Rhythm: Normal rate.  ?Pulmonary:  ?   Effort: Pulmonary effort is normal.  ?Skin: ?   General: Skin is warm and dry.  ?Neurological:  ?   Mental Status: He is alert and oriented to person,  place, and time.  ?Psychiatric:     ?   Mood and Affect: Mood is anxious.  ?         ? ?Vital Signs: BP (!) 101/58 (BP Location: Left Arm)   Pulse 93   Temp 98.1 ?F (36.7 ?C)   Resp 17   Ht '6\' 2"'$  (1.88 m)   Wt 77.1 kg   SpO2 100%   BMI 21.82 kg/m?  ?SpO2: SpO2: 100 % ?O2 Device: O2 Device: Nasal Cannula ?O2 Flow Rate: O2 Flow Rate (L/min): 2 L/min ? ?Intake/output summary:  ?Intake/Output Summary (Last 24 hours) at 03/28/2022 1607 ?Last data filed at 03/28/2022 1100 ?Gross per 24 hour  ?Intake 600 ml  ?Output 735 ml  ?Net -135 ml  ? ?LBM: Last BM Date : 03/25/22 ?Baseline Weight: Weight: 79.3 kg ?Most recent weight: Weight: 77.1 kg ? ?     ?Palliative Assessment/Data: ? ? ? ? ? ?Patient Active Problem List  ? Diagnosis Date Noted  ? Urinary tract infection, E. coli 03/28/2022  ? Sepsis ([place]) 03/28/2022  ? Peripheral neuropathy 03/02/2022  ? Goals of care, counseling/discussion 11/30/2020  ? Dysphagia 08/23/2020  ? Psoriasis 04/23/2020  ? Normal anion gap metabolic acidosis   ? Atrial fibrillation ([place]) 10/14/2018  ? Type 2 diabetes mellitus ([place]) 09/04/2017  ? 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 Ischemic pain of foot, right   ? Severe peripheral arterial disease ([place]) 02/27/2017  ? Benign prostatic hyperplasia with urinary obstruction   ? Orthostatic hypotension 04/04/2016  ? Urinary retention 03/20/2016  ? Cerebrovascular accident (CVA) due to occlusion of left posterior cerebral artery ([place])   ? Constipation 11/07/2014  ? Essential hypertension   ?  Normocytic anemia 10/20/2014  ? Hyporeninemic hypoaldosteronism ([place]) 08/24/2014  ? Hyperkalemia 08/01/2014  ? Healthcare maintenance 07/13/2014  ? Atherosclerosis of leg with intermittent claudication, R 06/29/2014  ? Current smoker 06/27/2014  ? CKD (chronic kidney disease), stage V ([place]) 06/26/2014  ? ? ?Palliative Care Assessment & Plan  ? ?Patient Profile: ?81 y.o. male  with past medical history of CKD stage V with recurrent hyperkalemia, hypertension, hyperlipidemia, CVA with residual left-sided weakness and left-sided hemiballismus, A-fib on Eliquis, peripheral vascular disease and type 2 diabetes  admitted on 03/26/2022 with hyperkalemia and sepsis of unclear etiology. ?  ?Patient has expressed his wishes to avoid HD in both outpatient and inpatient settings. PMT has been consulted to assist with goals of care conversation. ? ?Assessment: ?Sepsis ?CKD5 ?DM2 ?Goals of care conversation ? ?Recommendations/Plan: ?Patient does not wish to have [place] discussion at this time ?Goal seems to be to continue medical management and avoid HD ?Psychosocial and emotional support provided ?PMT will continue to follow and re-attempt to engage ? ? ?Prognosis: ? Unable to determine ? ?Discharge Planning: ?To Be Determined ? ? ?Total time: ?I spent 35 minutes in the care of the patient today in the above activities and documenting the encounter. ? ? ?Dorthy Cooler, PA-C ?Palliative Medicine Team ?Team phone # [phone] ? ?Thank you for allowing the Palliative Medicine Team to assist in the care of this patient. Please utilize secure chat with additional questions, if there is no response within 30 minutes please call the above phone number. ? ?Palliative Medicine Team providers are available by phone from 7am to 7pm daily and can be reached through the team cell phone.  ?Should this patient require assistance outside of these hours, please call the patient's attending physician.  ? ? ? ?

## 2022-03-28 NOTE — Discharge Instructions (Addendum)
Chronic Kidney Disease Stage 5 Tips For People Not On Dialysis Receiving Conservative, Supportive, Or Medical Care ? ?If you feel that dialysis and transplant are not for you, there are other options to manage the disease. This approach is called conservative, supportive, or medical care. ?As kidney disease progresses, there is an increase in uremia (waste products that build up in your blood). It is possible to manage symptoms of uremia--including loss of appetite, weight loss, nausea, and vomiting--with diet and medication. ? ?Tips ?Each symptom of uremia can be managed with different diet strategies. The following tips may help you manage your symptoms. ?Meal Pattern ?Eat small, frequent meals throughout the day instead of three big meals. ?  ?Symptom Tips for Symptom Management  ?Poor appetite and weight loss Choose foods that are most appealing to you. ?Eat small meals and avoid skipping meals. ?Choose foods with full fat content and avoid foods marketed as low-fat or no-fat. ?Add healthy fats like olive oil to foods for added calories. ?Use nutrition supplements such as protein powders (whey protein, pea protein) in smoothies or store-bought beverages. Ask your RDN for guidance.  ?High blood potassium levels (hyperkalemia) ?  Choose more low-potassium fruits and vegetables daily. ?Limit the high-potassium fruit and vegetables you eat daily. ?Limit meat servings sizes: to 3 ounces or the size of the palm of your hand. ?Limit daily dairy servings to 4 ounces or ? cup milk, 1 ounce of cheese (about the size of your thumb), or ? cup yogurt. ?Choose low-potassium juice, such as apple or grape. ?Limit smoothies, and meal replacement beverages with more than 200 milligrams per serving. ?Energy drinks are generally not recommended because they often have added potassium. ?Ask your RDN for guidance on which smoothies, meal replacement beverages and energy drinks can be included in your diet.  ?Fluid Overload ?  Keep  track of all fluids you drink in a day. Fluids include all beverages plus anything that is liquid at room temperature, such as soup, ice cream, sorbet, gelatin, popsicles, and ice. ?Use small cups to make sure you only drink small amounts throughout the day.  ?Dry mouth ?  Reduce sodium/salt intake to less than 2300 milligrams per day. Read labels to check how much sodium is in your foods. Avoid adding salt to your foods.  ?Keep lips and mouth moist by rinsing your mouth, brushing your teeth, or using a spray bottle with water. Artificial saliva products are also available. ?Add lemon juice to your beverages. ?Suck on sugar free hard mints, sour candy, or frozen fruit. ?Chew gum  ?Changes in taste Rinse your mouth with sodium bicarbonate mouthwash regularly. ?Use herbs, spices, or foods with tart, sweet, or strong flavors. ?Use cold temperature foods and allow food to cool before eating. ?Use plastic or bamboo utensils instead of metal.  ?Nausea, dry heaves, and vomiting ?  Eat small meals or snacks throughout the day. Avoid skipping meals or eating large meals as this may worsen nausea. ?If the smell of food makes you nauseated, stay out of the room where food is being prepared. ?Rinse regularly with sodium bicarbonate mouthwash. ?Dry, bland and room temperature foods may be better tolerated. ?Use ginger products like ginger tea, ginger ale, and ginger candy to relieve nausea.  ?Constipation Eat small meals or snacks throughout the day. ?Avoid fried and high fat foods. ?Eat high-fiber foods including fruits, vegetables, and whole grains like oatmeal. ?Participate in regular physical activity as you are able. ?Discuss with your doctor before starting  any over the counter supplements (high fiber or probiotic supplements).  ?Copyright 2020 ? Academy [employer]. All rights reserved ? ?Information on my medicine - ELIQUIS? (apixaban) ? ?This medication education was reviewed with me or my healthcare  representative as part of my discharge preparation.  The pharmacist that spoke with me during my hospital stay was:   ? ?Why was Eliquis? prescribed for you? ?Eliquis? was prescribed for you to reduce the risk of a blood clot forming that can cause a stroke if you have a medical condition called atrial fibrillation (a type of irregular heartbeat). ? ?What do You need to know about Eliquis? ? ?Take your Eliquis? TWICE DAILY - one tablet in the morning and one tablet in the evening with or without food. If you have difficulty swallowing the tablet whole please discuss with your pharmacist how to take the medication safely. ? ?Take Eliquis? exactly as prescribed by your doctor and DO NOT stop taking Eliquis? without talking to the doctor who prescribed the medication.  Stopping may increase your risk of developing a stroke.  Refill your prescription before you run out. ? ?After discharge, you should have regular check-up appointments with your healthcare provider that is prescribing your Eliquis?.  In the future your dose may need to be changed if your kidney function or weight changes by a significant amount or as you get older. ? ?What do you do if you miss a dose? ?If you miss a dose, take it as soon as you remember on the same day and resume taking twice daily.  Do not take more than one dose of ELIQUIS at the same time to make up a missed dose. ? ?Important Safety Information ?A possible side effect of Eliquis? is bleeding. You should call your healthcare provider right away if you experience any of the following: ?Bleeding from an injury or your nose that does not stop. ?Unusual colored urine (red or dark brown) or unusual colored stools (red or black). ?Unusual bruising for unknown reasons. ?A serious fall or if you hit your head (even if there is no bleeding). ? ?Some medicines may interact with Eliquis? and might increase your risk of bleeding or clotting while on Eliquis?[name] To help avoid this, consult your  healthcare provider or pharmacist prior to using any new prescription or non-prescription medications, including herbals, vitamins, non-steroidal anti-inflammatory drugs (NSAIDs) and supplements. ? ?This website has more information on Eliquis? (apixaban): http://www.eliquis.com/eliquis/home ? ?

## 2022-03-28 NOTE — Progress Notes (Signed)
New Admission Note:  ? ?Arrival Method: via stretcher from ED ?Mental Orientation: alert and oriented x3 ?Telemetry: 50m8, CCMD notified ?Assessment: Completed ?Skin: Intact ?IV: RUA, saline locked ?Pain: 0/10 ?Tubes: None ?Safety Measures: Safety Fall Prevention Plan has been discussed  ?Admission: completed ?5 [place]: Patient has been orientated to the room, unit and staff.   ?Family: none at bedside ? ?Orders to be reviewed and implemented. Will continue to monitor the patient. Call light has been placed within reach and bed alarm has been activated.  ? ?

## 2022-03-29 ENCOUNTER — Inpatient Hospital Stay (HOSPITAL_COMMUNITY): Payer: Medicare HMO

## 2022-03-29 DIAGNOSIS — B9620 Unspecified Escherichia coli [E. coli] as the cause of diseases classified elsewhere: Secondary | ICD-10-CM | POA: Diagnosis not present

## 2022-03-29 DIAGNOSIS — Z7189 Other specified counseling: Secondary | ICD-10-CM | POA: Diagnosis not present

## 2022-03-29 DIAGNOSIS — N390 Urinary tract infection, site not specified: Secondary | ICD-10-CM | POA: Diagnosis not present

## 2022-03-29 DIAGNOSIS — E875 Hyperkalemia: Secondary | ICD-10-CM | POA: Diagnosis not present

## 2022-03-29 LAB — CBC
HCT: 28 % — ABNORMAL LOW (ref 39.0–52.0)
Hemoglobin: 9.1 g/dL — ABNORMAL LOW (ref 13.0–17.0)
MCH: 30.8 pg (ref 26.0–34.0)
MCHC: 32.5 g/dL (ref 30.0–36.0)
MCV: 94.9 fL (ref 80.0–100.0)
Platelets: 152 10*3/uL (ref 150–400)
RBC: 2.95 MIL/uL — ABNORMAL LOW (ref 4.22–5.81)
RDW: 15.7 % — ABNORMAL HIGH (ref 11.5–15.5)
WBC: 33.9 10*3/uL — ABNORMAL HIGH (ref 4.0–10.5)
nRBC: 0 % (ref 0.0–0.2)

## 2022-03-29 LAB — BASIC METABOLIC PANEL
Anion gap: 14 (ref 5–15)
BUN: 113 mg/dL — ABNORMAL HIGH (ref 8–23)
CO2: 15 mmol/L — ABNORMAL LOW (ref 22–32)
Calcium: 6.9 mg/dL — ABNORMAL LOW (ref 8.9–10.3)
Chloride: 111 mmol/L (ref 98–111)
Creatinine, Ser: 6.79 mg/dL — ABNORMAL HIGH (ref 0.61–1.24)
GFR, Estimated: 7 mL/min — ABNORMAL LOW (ref >60)
Glucose, Bld: 52 mg/dL — ABNORMAL LOW (ref 70–99)
Potassium: 4.2 mmol/L (ref 3.5–5.1)
Sodium: 140 mmol/L (ref 135–145)

## 2022-03-29 LAB — URINE CULTURE: Culture: >=100000 — AB

## 2022-03-29 LAB — PROCALCITONIN: Procalcitonin: >150 ng/mL

## 2022-03-29 LAB — GLUCOSE, CAPILLARY: Glucose-Capillary: 71 mg/dL (ref 70–99)

## 2022-03-29 NOTE — Progress Notes (Signed)
? ?                                                                                                                                                     ?                                                   ?Daily Progress Note  ? ?Patient Name: Derrick Hendrix.       Date: 03/29/2022 ?DOB: 04/09/1941  Age: 81 y.o. MRN#: [id number] ?Attending Physician: Aldine Contes, MD ?Primary Care Physician: [name], MD ?Admit Date: 03/26/2022 ? ?Reason for Consultation/Follow-up: Establishing goals of care ? ?Subjective: ?Medical records reviewed including progress notes, labs. Patient assessed at the bedside. He states that he feels very tired, no other complaints. Oriented to person and place. No family present during my visit. ? ?Patient gives this PA to call his niece [name] and offer support, discuss options and goals of care. I then called [name] and provided update on our recent conversations, as well as update on patient's disorientation and worsening renal function today. I shared my worry that patient will continue to decline and created space and opportunity for Tracey's thoughts and feelings on his current illness. She shares that he is a very independent and prideful person who wishes to remain independent as long as possible. He has had one visit from palliative and hospice at home in the past but refuses ongoing support, as he prefers to maintain on his own. She is concerned that he can no longer stay at home due to a lack of running water and is concerned he will not take it well when informed of this. He enjoys taking care of his 3 stray cats at home. We discussed patient's quality of life and [name] agrees he would not want to be dependent on others for a prolonged period. We discussed hospice philosophy and option of residential hospice given patient would likely have limited prognosis without HD. She wishes to be present when this is discussed with the patient and will inform this PA of when she  will be able to visit at the bedside. ? ?4PM: We then met at the bedside as planned and reviewed patient's illness including today's abdominal CT results. Created space and opportunity for patient to share his thoughts and feelings - he has a difficult and uncomfortable couple of days and is intrigued by the option to focus on comfort moving forward. Reviewed comfort care in detail - patient would no longer receive aggressive medical interventions such as continuous vital signs, lab work, radiology testing, or medications not focused on comfort. All care would focus on how the patient is looking and  feeling. This would include management of any symptoms that may cause discomfort, pain, shortness of breath, cough, nausea, agitation, anxiety, and/or secretions etc. Symptoms would be managed with medications and other non-pharmacological interventions such as spiritual support if requested, repositioning, music therapy, or therapeutic listening. Discussed comfort feeding and lack of benefit from artificial hydration/nutrition. Discussed typical trajectory and expectations at end of life with renal failure. [name] shares there has been family at [facility] and they have had a good experience. Patient solely wishes to have good care and be respected. He does not have a preference on quality vs quantity of days.   ?    ?Questions and concerns addressed. PMT will continue to support holistically.  ? ?Length of Stay: 2 ? ?Current Medications: ?Scheduled Meds:  ? apixaban  2.5 mg Oral BID  ? atorvastatin  80 mg Oral Daily  ? calcitRIOL  0.25 mcg Oral Daily  ? feeding supplement (NEPRO CARB STEADY)  237 mL Oral BID BM  ? finasteride  5 mg Oral Daily  ? melatonin  5 mg Oral QHS  ? multivitamin  1 tablet Oral QHS  ? pantoprazole  40 mg Oral Daily  ? sodium bicarbonate  1,300 mg Oral TID  ? tamsulosin  0.8 mg Oral Daily  ? ? ?Continuous Infusions: ? cefTRIAXone (ROCEPHIN)  IV 2 g (03/29/22 1207)  ? ? ?PRN Meds: ?acetaminophen  **OR** acetaminophen, camphor-menthol, white petrolatum ? ?Physical Exam ?Vitals and nursing note reviewed.  ?Constitutional:   ?   Appearance: He is ill-appearing.  ?   Interventions: Nasal cannula in place.  ?Cardiovascular:  ?   Rate and Rhythm: Normal rate.  ?Pulmonary:  ?   Effort: Pulmonary effort is normal.  ?Skin: ?   General: Skin is warm and dry.  ?Neurological:  ?   Mental Status: He is alert. He is disoriented.  ?   Comments: Answers 1993 to year  ?Psychiatric:     ?   Mood and Affect: Mood is anxious.  ?         ? ?Vital Signs: BP 119/60   Pulse 94   Temp 98 ?F (36.7 ?C) (Oral)   Resp 17   Ht _0  (1.88 m)   Wt 78.2 kg   SpO2 93%   BMI 22.13 kg/m?  ?SpO2: SpO2: 93 % ?O2 Device: O2 Device: Room Air ?O2 Flow Rate: O2 Flow Rate (L/min): 2 L/min ? ?Intake/output summary:  ?Intake/Output Summary (Last 24 hours) at 03/29/2022 1246 ?Last data filed at 03/29/2022 1100 ?Gross per 24 hour  ?Intake 757 ml  ?Output --  ?Net 757 ml  ? ? ?LBM: Last BM Date : 03/28/22 ?Baseline Weight: Weight: 79.3 kg ?Most recent weight: Weight: 78.2 kg ? ?     ?Palliative Assessment/Data: ? ? ? ? ? ?Patient Active Problem List  ? Diagnosis Date Noted  ? Urinary tract infection, E. coli 03/28/2022  ? Sepsis ([place]) 03/28/2022  ? Peripheral neuropathy 03/02/2022  ? Goals of care, counseling/discussion 11/30/2020  ? Dysphagia 08/23/2020  ? Psoriasis 04/23/2020  ? Normal anion gap metabolic acidosis   ? Atrial fibrillation ([place]) 10/14/2018  ? Type 2 diabetes mellitus ([place]) 09/04/2017  ? 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 Ischemic pain of foot, right   ? Severe peripheral arterial disease ([place]) 02/27/2017  ? Benign prostatic hyperplasia with urinary obstruction   ? Orthostatic hypotension 04/04/2016  ? Urinary retention 03/20/2016  ? Cerebrovascular accident (CVA) due to occlusion of left posterior cerebral artery ([place])   ?  Constipation 11/07/2014  ? Essential hypertension   ? Normocytic anemia 10/20/2014  ? Hyporeninemic hypoaldosteronism  ([place]) 08/24/2014  ? Hyperkalemia 08/01/2014  ? Healthcare maintenance 07/13/2014  ? Atherosclerosis of leg with intermittent claudication, R 06/29/2014  ? Current smoker 06/27/2014  ? CKD (chronic kidney disease), stage V ([place]) 06/26/2014  ? ? ?Palliative Care Assessment & Plan  ? ?Patient Profile: ?81 y.o. male  with past medical history of CKD stage V with recurrent hyperkalemia, hypertension, hyperlipidemia, CVA with residual left-sided weakness and left-sided hemiballismus, A-fib on Eliquis, peripheral vascular disease and type 2 diabetes  admitted on 03/26/2022 with hyperkalemia and sepsis of unclear etiology. ?  ?Patient has expressed his wishes to avoid HD in both outpatient and inpatient settings. PMT has been consulted to assist with goals of care conversation. ? ?Assessment: ?Sepsis ?CKD5 ?DM2 ?Goals of care conversation ? ?Recommendations/Plan: ?Continue current care ?Patient's niece wishes to see how he does for another day and revisit goc discussion tomorrow, would be open to comfort care and referral to residential hospice if he continues to decline ?Psychosocial and emotional support provided ?PMT will continue to follow ? ? ?Prognosis: ? Unable to determine ? ?Discharge Planning: ?To Be Determined ? ? ?MDM: High ? ?Dorthy Cooler, PA-C ?Palliative Medicine Team ?Team phone # [phone] ? ?Thank you for allowing the Palliative Medicine Team to assist in the care of this patient. Please utilize secure chat with additional questions, if there is no response within 30 minutes please call the above phone number. ? ?Palliative Medicine Team providers are available by phone from 7am to 7pm daily and can be reached through the team cell phone.  ?Should this patient require assistance outside of these hours, please call the patient's attending physician.  ? ? ? ?

## 2022-03-29 NOTE — Plan of Care (Signed)
  Problem: Pain Managment: Goal: General experience of comfort will improve Outcome: Progressing   Problem: Safety: Goal: Ability to remain free from injury will improve Outcome: Progressing   Problem: Skin Integrity: Goal: Risk for impaired skin integrity will decrease Outcome: Progressing   

## 2022-03-29 NOTE — Plan of Care (Signed)
?  Problem: Health Behavior/Discharge Planning: Goal: Ability to manage health-related needs will improve Outcome: Progressing   Problem: Clinical Measurements: Goal: Ability to maintain clinical measurements within normal limits will improve Outcome: Progressing Goal: Will remain free from infection Outcome: Progressing Goal: Diagnostic test results will improve Outcome: Progressing Goal: Respiratory complications will improve Outcome: Progressing Goal: Cardiovascular complication will be avoided Outcome: Progressing   Problem: Activity: Goal: Risk for activity intolerance will decrease Outcome: Progressing   Problem: Nutrition: Goal: Adequate nutrition will be maintained Outcome: Progressing   Problem: Coping: Goal: Level of anxiety will decrease Outcome: Progressing   Problem: Pain Managment: Goal: General experience of comfort will improve Outcome: Progressing   Problem: Safety: Goal: Ability to remain free from injury will improve Outcome: Progressing   Problem: Skin Integrity: Goal: Risk for impaired skin integrity will decrease Outcome: Progressing   

## 2022-03-29 NOTE — Progress Notes (Signed)
? ?Subjective: Derrick Hendrix ? ?This AM, patient reports improvements to itching with Sarna cream and endorses increased urinary frequency.  He continues to deny dysuria, abdominal pain, chest pain, dyspnea.  He states that he has eaten breakfast, but the tray is untouched; when asked about dinner, he says that he ate some but still has decreased appetite.  He reports no acute concerns or complaints today. ? ?Objective: ? ?Vital signs in last 24 hours: ?Vitals:  ? 03/29/22 0352 03/29/22 0430 03/29/22 0923 03/29/22 0930  ?BP:  (!) 123/52  119/60  ?Pulse:  94  94  ?Resp:  17  17  ?Temp:  98.9 ?F (37.2 ?C) 98.3 ?F (36.8 ?C) 98 ?F (36.7 ?C)  ?TempSrc:   Oral Oral  ?SpO2:  97%  93%  ?Weight: 78.2 kg     ?Height:      ? ?Physical Exam: ?Constitutional: Elderly male with intermittent shivering in bed, lethargic, falling asleep during assessment ?HEENT: normocephalic, atraumatic, moist mucus membranes, patient with poor dentition ?Cardio: Normal rate and rhythm, no murmurs, rubs, gallops. ?Pulm: CTAB, No increased work of breathing on room air with no wheezes or crackles ?Abd: soft, nontender, nondistended, +normal bowel sounds ?Skin: warm and dry ?Neuro: alert and oriented x 1, to person only, whereas previously oriented to person, time, and place ?Psych: Lethargic ?  ?Assessment/Plan: ?  ?Principal Problem: ?  Hyperkalemia ?Other Diagnoses: ?Sepsis ([place]) ?Urinary tract infection, E. coli ?  ?[name], Hendrix. is a 81 year old male with a past medical history significant for nonoliguric CKD stage V with recurrent hyperkalemia, hypertension, hyperlipidemia, CVA with residual L sided weakness and L sided hemiballismus, Afib on eliquis, peripheral vascular disease status post right SFA intervention in 2018, type 2 diabetes complicated by peripheral neuropathy who presented for hyperkalemia now complicated by fever, leukopenia, and tachycardia concerning for sepsis. ?  ?#Sepsis 2/2 UTI ?Patient currently afebrile and without  tachycardia but remains hypotensive at 119/60 which is improved from previous day, with continued concern for underlying sepsis. Procal elevated > 150, WBC 33.9. He no longer has O2 requirement this morning, without focal findings on lung exam, and with neg resp panel/COVID.  Blood smear with neutropenia, no blasts.  Patient UA positive for possible UTI, and urine culture with greater than 100,000 E. coli.  He endorses increased urinary frequency today but continues to deny dysuria and is without suprapubic tenderness. No other sources of infection identified at this time; sepsis likely secondary to UTI. Patient now on ceftriaxone, to which patient strain is sensitive. Blood cultures currently with no growth x2 days. ?-Continue ceftriaxone 2g daily ?-Follow-up blood cultures ?- Obtain CT abdomen pelvis to visualize possible source ?  ?#CKD stage V (non oliguric) ?Per patient's niece he follows with nephrology and was evaluated by vascular surgery.  We do not have access to these records.  Patient's niece states that he was told that his left upper extremity would not be suitable for AV fistula and patient did not want to entertain other sites. He remains nonoliguric with BUN elevated to 113 and creatinine of 6.79. Patient appears to be chronically at this level and asymptomatic. Nephrology was consulted and signed off, as patient and niece do not want intervention. ?-Avoid nephrotoxic agents ?  ?#Paroxysmal atrial fibrillation ?Patient no longer tachycardic.  Currently on anticoagulation with Eliquis for atrial fibrillation. Will continue home meds. ?-Continue Eliquis 2.'5mg'$  BID ? ?#Hypotension, acute ?#Hx of hypertension ?Patient remains hypotensive with most recent BP of 119/60 today likely in the  setting of sepsis. Will continue to hold home amlodipine and hydralazine. ? ?#Chronic anemia, macrocytic ?Recent iron studies 3/31 are remarkable for iron deficiency.  Most likely related to chronic kidney disease.  Vitamin B12 low-normal and folate wnl. ?- CTM on daily CBC ?- We will hold off on iron infusion at this time due to active infection ?  ?#Hyperlipidemia ?- Continue home atorvastatin ?  ?#T2DM c/b peripheral neuropathy ?Patient with history of diabetes however most recent A1c within normal limits.  In addition patient is currently not on any antidiabetic medications. Glucose has been low to normal normal in 50s- 100s. Home med: gabapentin 100 mg nightly for his neuropathic pain.  ?-CBG monitoring ?-Hold gabapentin '100mg'$  qhs ?  ?  ?#PAD s/p R SFA nitinol stent in 2018 ?Most recent ABIs were indeterminant due to patient motion.  No concern for acute peripheral vascular disease. Continue medical management per above ?  ?#Hx CVA w/ residual L sided weakness ?Patient reports some residual left-sided weakness as well as some left-sided hemiballismus.  On exam his left upper extremity and left lower extremity strength were 5 out of 5.  It appears patient is currently not on a antiplatelet agent but is on atorvastatin as above. ? ?Prior to Admission Living Arrangement: Home ?Anticipated Discharge Location: Pending; Palliative on board ?Barriers to Discharge: Clinical improvement ?Dispo: Anticipated discharge in approximately 3-5 day(s).  ? ?Rosezetta Schlatter, MD ?03/29/2022, 9:35 AM ?Pager: [phone] ?After 5pm on weekdays and 1pm on weekends: On Call pager [phone]  ?

## 2022-03-30 ENCOUNTER — Inpatient Hospital Stay (HOSPITAL_COMMUNITY): Payer: Medicare HMO

## 2022-03-30 DIAGNOSIS — E875 Hyperkalemia: Secondary | ICD-10-CM | POA: Diagnosis not present

## 2022-03-30 DIAGNOSIS — B9620 Unspecified Escherichia coli [E. coli] as the cause of diseases classified elsewhere: Secondary | ICD-10-CM | POA: Diagnosis not present

## 2022-03-30 DIAGNOSIS — N390 Urinary tract infection, site not specified: Secondary | ICD-10-CM | POA: Diagnosis not present

## 2022-03-30 DIAGNOSIS — Z7189 Other specified counseling: Secondary | ICD-10-CM | POA: Diagnosis not present

## 2022-03-30 LAB — BASIC METABOLIC PANEL
Anion gap: 13 (ref 5–15)
BUN: 115 mg/dL — ABNORMAL HIGH (ref 8–23)
CO2: 16 mmol/L — ABNORMAL LOW (ref 22–32)
Calcium: 7.7 mg/dL — ABNORMAL LOW (ref 8.9–10.3)
Chloride: 111 mmol/L (ref 98–111)
Creatinine, Ser: 6.32 mg/dL — ABNORMAL HIGH (ref 0.61–1.24)
GFR, Estimated: 8 mL/min — ABNORMAL LOW (ref >60)
Glucose, Bld: 66 mg/dL — ABNORMAL LOW (ref 70–99)
Potassium: 4.3 mmol/L (ref 3.5–5.1)
Sodium: 140 mmol/L (ref 135–145)

## 2022-03-30 LAB — DIFFERENTIAL
Abs Immature Granulocytes: 0.27 10*3/uL — ABNORMAL HIGH (ref 0.00–0.07)
Basophils Absolute: 0.2 10*3/uL — ABNORMAL HIGH (ref 0.0–0.1)
Basophils Relative: 0 %
Eosinophils Absolute: 0.3 10*3/uL (ref 0.0–0.5)
Eosinophils Relative: 1 %
Immature Granulocytes: 1 %
Lymphocytes Relative: 3 %
Lymphs Abs: 1.2 10*3/uL (ref 0.7–4.0)
Monocytes Absolute: 1.2 10*3/uL — ABNORMAL HIGH (ref 0.1–1.0)
Monocytes Relative: 3 %
Neutro Abs: 34.9 10*3/uL — ABNORMAL HIGH (ref 1.7–7.7)
Neutrophils Relative %: 92 %
Smear Review: ADEQUATE

## 2022-03-30 LAB — CBC
HCT: 28 % — ABNORMAL LOW (ref 39.0–52.0)
Hemoglobin: 9 g/dL — ABNORMAL LOW (ref 13.0–17.0)
MCH: 30.9 pg (ref 26.0–34.0)
MCHC: 32.1 g/dL (ref 30.0–36.0)
MCV: 96.2 fL (ref 80.0–100.0)
Platelets: 159 10*3/uL (ref 150–400)
RBC: 2.91 MIL/uL — ABNORMAL LOW (ref 4.22–5.81)
RDW: 15.7 % — ABNORMAL HIGH (ref 11.5–15.5)
WBC: 36.7 10*3/uL — ABNORMAL HIGH (ref 4.0–10.5)
nRBC: 0 % (ref 0.0–0.2)

## 2022-03-30 MED ORDER — DEXTROSE-NACL 5-0.45 % IV SOLN: INTRAVENOUS | Status: AC

## 2022-03-30 NOTE — Progress Notes (Signed)
? ?                                                                                                                                                     ?                                                   ?Daily Progress Note  ? ?Patient Name: Derrick Hendrix.       Date: 03/30/2022 ?DOB: Jul 10, 1941  Age: 81 y.o. MRN#: [id number] ?Attending Physician: Aldine Contes, MD ?Primary Care Physician: [name], MD ?Admit Date: 03/26/2022 ? ?Reason for Consultation/Follow-up: Establishing goals of care ? ?Subjective: ?Medical records reviewed including progress notes, labs. Patient assessed at the bedside. He tells me he continues to feel tired and after further reflection on treatment options and goals of care, he is leaning towards "focusing on relaxation."  ? ?I then called patient's niece [name] to provide updates and support. Reviewed labs including Cr, WBC, and ongoing work out to determine source of patient's sepsis. Shared with her that patient continues to be voicing his interest in a more comfort-oriented care approach, recommending an additional meeting to discuss this with her support. She wishes to continue workup of patient's infection and is willing to meet again at the bedside tomorrow.  ? ?Questions and concerns addressed. PMT will continue to support holistically.  ? ?Length of Stay: 3 ? ?Current Medications: ?Scheduled Meds:  ? apixaban  2.5 mg Oral BID  ? atorvastatin  80 mg Oral Daily  ? calcitRIOL  0.25 mcg Oral Daily  ? feeding supplement (NEPRO CARB STEADY)  237 mL Oral BID BM  ? finasteride  5 mg Oral Daily  ? melatonin  5 mg Oral QHS  ? multivitamin  1 tablet Oral QHS  ? pantoprazole  40 mg Oral Daily  ? sodium bicarbonate  1,300 mg Oral TID  ? tamsulosin  0.8 mg Oral Daily  ? ? ?Continuous Infusions: ? cefTRIAXone (ROCEPHIN)  IV 2 g (03/29/22 1207)  ? ? ?PRN Meds: ?acetaminophen **OR** acetaminophen, camphor-menthol, white petrolatum ? ?Physical Exam ?Vitals and nursing note reviewed.   ?Constitutional:   ?   Appearance: He is ill-appearing.  ?   Interventions: Nasal cannula in place.  ?Cardiovascular:  ?   Rate and Rhythm: Normal rate.  ?Pulmonary:  ?   Effort: Pulmonary effort is normal.  ?Skin: ?   General: Skin is warm and dry.  ?Neurological:  ?   Mental Status: He is alert. He is disoriented.  ?   Comments: Answers 1993 to year  ?Psychiatric:     ?   Mood and Affect: Mood is anxious.  ?         ? ?  Vital Signs: BP 136/73   Pulse 80   Temp 97.7 ?F (36.5 ?C) (Oral)   Resp 15   Ht '6\' 2"'$  (1.88 m)   Wt 77 kg   SpO2 96%   BMI 21.80 kg/m?  ?SpO2: SpO2: 96 % ?O2 Device: O2 Device: Room Air ?O2 Flow Rate: O2 Flow Rate (L/min): 2 L/min ? ?Intake/output summary:  ?Intake/Output Summary (Last 24 hours) at 03/30/2022 1059 ?Last data filed at 03/30/2022 0913 ?Gross per 24 hour  ?Intake 737 ml  ?Output 825 ml  ?Net -88 ml  ? ? ?LBM: Last BM Date : 03/28/22 ?Baseline Weight: Weight: 79.3 kg ?Most recent weight: Weight: 77 kg ? ?  ? ?Patient Active Problem List  ? Diagnosis Date Noted  ? Urinary tract infection, E. coli 03/28/2022  ? Sepsis ([place]) 03/28/2022  ? Peripheral neuropathy 03/02/2022  ? Goals of care, counseling/discussion 11/30/2020  ? Dysphagia 08/23/2020  ? Psoriasis 04/23/2020  ? Normal anion gap metabolic acidosis   ? Atrial fibrillation ([place]) 10/14/2018  ? Type 2 diabetes mellitus ([place]) 09/04/2017  ? 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 Ischemic pain of foot, right   ? Severe peripheral arterial disease ([place]) 02/27/2017  ? Benign prostatic hyperplasia with urinary obstruction   ? Orthostatic hypotension 04/04/2016  ? Urinary retention 03/20/2016  ? Cerebrovascular accident (CVA) due to occlusion of left posterior cerebral artery ([place])   ? Constipation 11/07/2014  ? Essential hypertension   ? Normocytic anemia 10/20/2014  ? Hyporeninemic hypoaldosteronism ([place]) 08/24/2014  ? Hyperkalemia 08/01/2014  ? Healthcare maintenance 07/13/2014  ? Atherosclerosis of leg with intermittent claudication, R  06/29/2014  ? Current smoker 06/27/2014  ? CKD (chronic kidney disease), stage V ([place]) 06/26/2014  ? ? ?Palliative Care Assessment & Plan  ? ?Patient Profile: ?81 y.o. male  with past medical history of CKD stage V with recurrent hyperkalemia, hypertension, hyperlipidemia, CVA with residual left-sided weakness and left-sided hemiballismus, A-fib on Eliquis, peripheral vascular disease and type 2 diabetes  admitted on 03/26/2022 with hyperkalemia and sepsis of unclear etiology. ?  ?Patient has expressed his wishes to avoid HD in both outpatient and inpatient settings. PMT has been consulted to assist with goals of care conversation. ? ?Assessment: ?Sepsis ?CKD5 ?DM2 ?Goals of care conversation ? ?Recommendations/Plan: ?Continue current care and workup of sepsis ?Family meeting at [place] tomorrow to continue goc discussions given patient is gradually voicing more inclination towards comfort care ?Psychosocial and emotional support provided ?PMT will continue to follow ? ?Prognosis: ? Unable to determine ? ?Discharge Planning: ?To Be Determined ? ? ?MDM: High ? ?Dorthy Cooler, PA-C ?Palliative Medicine Team ?Team phone # [phone] ? ?Thank you for allowing the Palliative Medicine Team to assist in the care of this patient. Please utilize secure chat with additional questions, if there is no response within 30 minutes please call the above phone number. ? ?Palliative Medicine Team providers are available by phone from 7am to 7pm daily and can be reached through the team cell phone.  ?Should this patient require assistance outside of these hours, please call the patient's attending physician.  ? ? ? ?

## 2022-03-30 NOTE — Plan of Care (Signed)
  Problem: Clinical Measurements: Goal: Respiratory complications will improve Outcome: Progressing   Problem: Pain Managment: Goal: General experience of comfort will improve Outcome: Progressing   Problem: Safety: Goal: Ability to remain free from injury will improve Outcome: Progressing   Problem: Skin Integrity: Goal: Risk for impaired skin integrity will decrease Outcome: Progressing   

## 2022-03-30 NOTE — Consult Note (Incomplete)
?   ? ? ? ? ?[facility]   ? ?Date of Admission:  03/26/2022   Total days of inpatient antibiotics *** ? ?      ?Reason for Consult: Leukocytosis    ?Principal Problem: ?  Hyperkalemia ?Active Problems: ?  Urinary tract infection, E. coli ?  Sepsis ([place]) ? ? ?Assessment: ?81 year old male with history of osteomyelitis of right great toe, PAD status post bypass admitted for hyperkalemia in the setting of CKD 5, found to have sepsis secondary to UTI ? ?#leukocytosis ?#Sepsis secondary to E. coli UTI ?#History of right great toe osteomyelitis ?#CKD 5/admitted for hyperkalemia ?-Initially presented with leukopenia with WBC 1K now 33 K.  Urine cultures positive for E. coli on ceftriaxone. ?CT on 4/15 showed no focal abscess, low and high density lesions bilateral kidneys noted.  Small bilateral pleural effusions noted. ?Recommendations:  ?-Continue ceftriaxone ?-Diff added to cbc, to look at possible eosinophilia which was not present ?-Renal US added per cysts seen on CT, there is a combination of low and high density cyst in the setting of leukocytosis and pain and needs further evaluation. ?-MRI LE ?Microbiology:   ?Antibiotics: ?Cefepime 4/12 ?Ceftriaxone 4/13-p ?Metronidazole 4/13 ?Vancomycin 4/12 ? ?Cultures: ?Blood ?4/12 NG ?4/13 NG ?Urine ?4/12 Ecoli ? ? ? ?HPI: Derrick Hendrix. is a 81 y.o. male with CKD, CVA with residual lt-sided weakness and hemiballismus, A-fib on Eliquis, peripheral vascular disease, type 2 diabetes, severe PAD status post bypass in 2018, osteomyelitis of the right great toe seen by ID Dr. Tommy Medal and noted most likely need amputation admitted hyperkalemia.  Found to have fever, leukopenia.  Recent cultures positive for E. coli, patient treated with ceftriaxone.  He remained afebrile during hospitalization, hospital course complicated by leukocytosis of 33 K.  CT on 4/15 showed no focal abscess.  Multiple low and high density lesions noted in bilateral kidneys.   Low-density lesions consistent with cysts, high density lesion favor hemorrhagic cyst.  Infectious disease engaged as patient continued leukocytosis on appropriate antibiotics. ? ? ?Review of Systems: ?Review of Systems  ?All other systems reviewed and are negative. ? ?Past Medical History:  ?Diagnosis Date  ? Abscess of foot without toes, left 08/28/2017  ? Chronic kidney disease (CKD), stage III (moderate) (H[facility])   ? Constipation 11/07/2014  ? Daily headache   ? "lately" (04/17/2016)  ? Depression   ? Dizziness 03/07/2016  ? Headache 06/26/2014  ? Heart murmur   ? Hyperkalemia 10/05/2018  ? Hyperlipemia 11/11/2016  ? Hypertension   ? Neuropathic pain 02/06/2015  ? Osteomyelitis ([place])   ? PAD (peripheral artery disease) ([place])   ? Paronychia of great toe, right   ? Preop cardiovascular exam   ? Right foot pain 02/09/2017  ? Stable angina ([place]) 06/27/2014  ? Stroke [facility]) 03/2016  ? hx of PAD; j"ust lots of headaches since" (04/17/2016)  ? Vitamin B12 deficiency 06/27/2014  ? Weight loss, unintentional 06/27/2014  ? ? ?Social History  ? ?Tobacco Use  ? Smoking status: Former  ?  Years: 66.00  ?  Types: Cigarettes  ?  Start date: 06/23/2017  ? Smokeless tobacco: Never  ?Vaping Use  ? Vaping Use: Never used  ?Substance Use Topics  ? Alcohol use: Not Currently  ?  Comment: 04/17/2016 "nothing in the last 3-4 years"  ? Drug use: No  ? ? ?Family History  ?Problem Relation Age of Onset  ? Hypertension Mother   ?  died age 59  ? Diabetes Mother   ? ?Scheduled Meds: ? apixaban  2.5 mg Oral BID  ? atorvastatin  80 mg Oral Daily  ? calcitRIOL  0.25 mcg Oral Daily  ? feeding supplement (NEPRO CARB STEADY)  237 mL Oral BID BM  ? finasteride  5 mg Oral Daily  ? melatonin  5 mg Oral QHS  ? multivitamin  1 tablet Oral QHS  ? pantoprazole  40 mg Oral Daily  ? sodium bicarbonate  1,300 mg Oral TID  ? tamsulosin  0.8 mg Oral Daily  ? ?Continuous Infusions: ? cefTRIAXone (ROCEPHIN)  IV 2 g (03/29/22 1207)  ? ?PRN Meds:.acetaminophen **OR**  acetaminophen, camphor-menthol, white petrolatum ?Allergies  ?Allergen Reactions  ? Aspirin Other (See Comments)  ?  Nosebleeds   ? ? ?OBJECTIVE: ?Blood pressure (!) 161/117, pulse 100, temperature 97.7 ?F (36.5 ?C), temperature source Oral, resp. rate 15, height '6\' 2"'$  (1.88 m), weight 77 kg, SpO2 96 %. ? ?Physical Exam ?Constitutional:   ?   General: He is not in acute distress. ?   Appearance: He is normal weight. He is not toxic-appearing.  ?HENT:  ?   Head: Normocephalic and atraumatic.  ?   Right Ear: External ear normal.  ?   Left Ear: External ear normal.  ?   Nose: No congestion or rhinorrhea.  ?   Mouth/Throat:  ?   Mouth: Mucous membranes are moist.  ?   Pharynx: Oropharynx is clear.  ?Eyes:  ?   Extraocular Movements: Extraocular movements intact.  ?   Conjunctiva/sclera: Conjunctivae normal.  ?   Pupils: Pupils are equal, round, and reactive to light.  ?Cardiovascular:  ?   Rate and Rhythm: Normal rate and regular rhythm.  ?   Heart sounds: No murmur heard. ?  No friction rub. No gallop.  ?Pulmonary:  ?   Effort: Pulmonary effort is normal.  ?   Breath sounds: Normal breath sounds.  ?Abdominal:  ?   General: Abdomen is flat. Bowel sounds are normal.  ?   Palpations: Abdomen is soft.  ?Musculoskeletal:     ?   General: No swelling. Normal range of motion.  ?   Cervical back: Normal range of motion and neck supple.  ?Skin: ?   General: Skin is warm and dry.  ?Neurological:  ?   General: No focal deficit present.  ?   Mental Status: He is oriented to person, place, and time.  ?Psychiatric:     ?   Mood and Affect: Mood normal.  ? ? ?Lab Results ?Lab Results  ?Component Value Date  ? WBC 36.7 (H) 03/30/2022  ? HGB 9.0 (L) 03/30/2022  ? HCT 28.0 (L) 03/30/2022  ? MCV 96.2 03/30/2022  ? PLT 159 03/30/2022  ?  ?Lab Results  ?Component Value Date  ? CREATININE 6.32 (H) 03/30/2022  ? BUN 115 (H) 03/30/2022  ? NA 140 03/30/2022  ? K 4.3 03/30/2022  ? CL 111 03/30/2022  ? CO2 16 (L) 03/30/2022  ?  ?Lab Results   ?Component Value Date  ? ALT 9 03/27/2022  ? AST 14 (L) 03/27/2022  ? ALKPHOS 63 03/27/2022  ? BILITOT 0.7 03/27/2022  ?  ? ? ? ?Laurice Record, MD ?[facility] ?[facility] ?03/30/2022, 10:57 AM  ?

## 2022-03-30 NOTE — Progress Notes (Addendum)
? ?Subjective: ? ?NAEO. Patient drowsy, but able to answer questions at bedside this AM, states he did not sleep well. States he is feeling better this AM. He endorses continued chills and feeling cold overnight, has not had subjective fevers or sweats. He was given additional blanket while in room, states this helped a bit. ? ?States he has been eating less due to poor appetite and supplemental shakes having a bad taste. Endorses bitter taste in mouth today. He denies any abdominal pain today. States he has been able to urinate and have BM normally. No constipation or diarrhea. No other concerns today. ? ?Objective: ? ?Vital signs in last 24 hours: ?Vitals:  ? 03/29/22 2050 03/30/22 0458 03/30/22 0838 03/30/22 1053  ?BP: (!) 135/93 (!) 143/91 (!) 161/117 136/73  ?Pulse: 99 (!) 101 100 80  ?Resp: '16 15 15   '$ ?Temp: 97.9 ?F (36.6 ?C) 98.5 ?F (36.9 ?C) 97.7 ?F (36.5 ?C)   ?TempSrc: Oral  Oral   ?SpO2: 95% 96% 96%   ?Weight:  77 kg    ?Height:      ? ?Weight change: -1.18 kg ? ?Intake/Output Summary (Last 24 hours) at 03/30/2022 1209 ?Last data filed at 03/30/2022 0913 ?Gross per 24 hour  ?Intake 560 ml  ?Output 625 ml  ?Net -65 ml  ? ?Physical Exam: ?Constitutional: Elderly male with intermittent shivering laying in bed, drowsy, responsive to questioning, NAD ?HEENT: normocephalic, atraumatic, patient with poor dentition  ?Cardio: normal rate and rhythm, no murmurs, rubs, or gallops ?Pulm: limited exam due to patient position, no increased work of breathing on room air, no wheezes or crackles ?Abd: soft, nondistended, nontender, + normal bowel sounds ?Extremities: mild decreased skin turgor to bilateral LE, no edema ?Neuro: alert and oriented x3 ?Psych: lethargic affect ? ?Assessment/Plan: ? ?Principal Problem: ?  Hyperkalemia ?Active Problems: ?  Urinary tract infection, E. coli ?  Sepsis ([place]) ? ?Mr. Chidera, Thivierge. 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 ?  ?#Sepsis 2/2 UTI  ?Patient with culture confirmed UTI secondary to E. Coli being treated with ceftriaxone 2g, verified with susceptibility testing as appropriate treatment for infection. He remains afebrile with improvement of BP, now at 136/73, with continued concern for sepsis.  He continues to endorse chills and feeling cold. WBC continues to increase, this morning at 36.7 from 6.6 (4/12).  There is some concern for infection not being appropriately treated with current antibiotic regimen given continued WBC elevation. Will consult ID today for treatment recommendations and adjust regimen as necessary. He is with no complaints of suprapubic pain or dysuria today. CT Abd/pelvis obtained yesterday for concern for potential abscess contributing to current presentation. No acute abnormality or focal abscess identified in the abdomen and pelvis. Multiple lesions consistent with simple cysts identified on bilateral kidneys with potential hemorrhagic cysts on L kidney identified; do not believe these are contributing to current presentation. He continues to no have O2 requirement, without focal findings on lung exam, and with neg resp panel/COVID. Blood smear with neutropenia, no blasts. No other sources of infection identified at this time; sepsis likely secondary to UTI. Blood cultures currently with no growth x3 days. Palliative following for  CKD, as below, with continued goals of care discussions. ?-Continue ceftriaxone 2g daily ?-ID consulted, appreciated recs ?-f/u ID recs, adjust antibiotic regimen as needed ?-f/u Blood cultures: NGTD x 3 days ?-f/u urine cultures: >100,000  colonies/mL E. coli. ? ?#CKD stage V (non oliguric) ?Per patient's niece he follows with nephrology and was evaluated by vascular surgery.  We do not have access to these records.  Patient's niece states that he was told that his left upper extremity would not be suitable for AV fistula and patient did not want to entertain other sites. He remains nonoliguric with BUN elevated to 115 and creatinine of 6.32. Patient appears to be chronically at this level and asymptomatic. Nephrology was consulted and signed off, as patient and niece do not want intervention. He is endorsing decreased po due to poor appetite and bitter taste in mouth today likely 2/2 uremia in setting of CKD. He  is currently on supplemental feeds with Nepro. Given end stage renal disease, patient is candidate for hospice care. Palliative care was consulted and currently following with plans for continued goals of care discussion today with patient and family.  ?-Avoid nephrotoxic agents ?-Palliative consulted, appreciate recommendations ?  ?#Paroxysmal atrial fibrillation ?Patient no longer tachycardic, HR remains on higher end of normal in 90s-100.  Currently on anticoagulation with Eliquis for atrial fibrillation. Will continue home meds. ?-Continue Eliquis 2.'5mg'$  BID ?  ?#Hx of hypertension ?Patient previously hypotensive likely in the setting of sepsis. Today BP improved, now at 136/73. Will consider restarting home antihypertensive if BP continues to increase.  ?-Continue to hold home amlodipine and Hydralazine  ? ?#Chronic anemia, macrocytic ?Recent iron studies 3/31 are remarkable for iron deficiency.  Most likely related to chronic kidney disease. Vitamin B12 low-normal and folate wnl. Hgb remains stable at 9-10, will CTM. ?- CTM on daily CBC ?- We will hold off on iron infusion at this time due to active infection ?  ?#Hyperlipidemia ?- Continue home atorvastatin ?  ?#T2DM c/b peripheral neuropathy ?Patient with history of diabetes however  most recent A1c within normal limits.  In addition patient is currently not on any antidiabetic medications. Glucose has been low to normal in 50s- 100s; lows likely secondary to poor po intake as above. Home med: gabapentin 100 mg nightly for his neuropathic pain.  ?-CBG monitoring ?-Hold gabapentin '100mg'$  qhs ?  ?#PAD s/p R SFA nitinol stent in 2018 ?Most recent ABIs were indeterminant due to patient motion.  No concern for acute peripheral vascular disease. Continue medical management per above ?  ?#Hx CVA w/ residual L sided weakness ?Patient reports some residual left-sided weakness as well as some left-sided hemiballismus.  On exam his left upper extremity and left lower extremity strength were 5 out of 5.  It appears patient is currently not on a antiplatelet agent but is on atorvastatin as above. ?  ?Prior to Admission Living Arrangement: Home ?Anticipated Discharge Location: Pending; Palliative on board ?Barriers to Discharge: Clinical improvement ?Dispo: Anticipated discharge in approximately 3-5 day(s).  ? ? LOS: 3 days  ? ?Rodriguez-Teodoro, Quintella Reichert, Medical Student ?03/30/2022, 12:09 PM  ?

## 2022-03-30 NOTE — Consult Note (Signed)
?   ? ? ? ? ?[facility] for Infectious Disease   ? ?Date of Admission:  03/26/2022   Total days of inpatient antibiotics 4 ? ?      ?Reason for Consult: Leukocytosis    ?Principal Problem: ?  Hyperkalemia ?Active Problems: ?  Urinary tract infection, E. coli ?  Sepsis ([place]) ? ? ?Assessment: ?81 year old male with history of osteomyelitis of right great toe, PAD status post bypass admitted for hyperkalemia in the setting of CKD 5, found to have sepsis secondary to UTI ? ?#Leukocytosis ?#Sepsis secondary to E. coli UTI ?#History of right great toe osteomyelitis ?#CKD 5/admitted for hyperkalemia ?-Earlier in hospitalization noted to have leukopenia with WBC 1K now 33 K.  Urine cultures positive for E. coli on ceftriaxone. ?CT on 4/15 showed no focal abscess, low and high density lesions bilateral kidneys noted.  Small bilateral pleural effusions noted. ?Recommendations:  ?-Continue ceftriaxone ?-Diff added to cbc, to look at possible eosinophilia which was not present ?-Renal US added per cysts seen on CT, there is a combination of low and high density cyst in the setting of leukocytosis and pain and needs further evaluation. ?-Orthopantogram ordered due to poor dentioina ?-Right great toe tender ot palpation-MRI right foot ordered.  ?Microbiology:   ?Antibiotics: ?Cefepime 4/12 ?Ceftriaxone 4/13-p ?Metronidazole 4/13 ?Vancomycin 4/12 ? ?Cultures: ?Blood ?4/12 NG ?4/13 NG ?Urine ?4/12 Ecoli ? ? ? ?HPI: Derrick Burling. is a 81 y.o. male with CKD, CVA with residual left-sided weakness and hemiballismus, A-fib on Eliquis, peripheral vascular disease, DM2, severe PAD SP bypass in 2018, osteomyelitis of the right great toe seen by ID Dr. Tommy Medal and noted most likely need amputation admitted for hyperkalemia.  Found to have fever, leukopenia. Urine cultures positive for E. coli, patient treated with ceftriaxone.  Derrick Hendrix remained afebrile during hospitalization, hospital course complicated by leukocytosis of 33 K.  CT on  4/15 showed no focal abscess.  Multiple low and high density lesions noted in bilateral kidneys.  Low-density lesions consistent with cysts, high density lesion favor hemorrhagic cyst.  Infectious disease engaged as patient continued leukocytosis on appropriate antibiotics. ? ? ?Review of Systems: ?Review of Systems  ?All other systems reviewed and are negative. ? ?Past Medical History:  ?Diagnosis Date  ? Abscess of foot without toes, left 08/28/2017  ? Chronic kidney disease (CKD), stage III (moderate) (HCC)   ? Constipation 11/07/2014  ? Daily headache   ? "lately" (04/17/2016)  ? Depression   ? Dizziness 03/07/2016  ? Headache 06/26/2014  ? Heart murmur   ? Hyperkalemia 10/05/2018  ? Hyperlipemia 11/11/2016  ? Hypertension   ? Neuropathic pain 02/06/2015  ? Osteomyelitis ([place])   ? PAD (peripheral artery disease) ([place])   ? Paronychia of great toe, right   ? Preop cardiovascular exam   ? Right foot pain 02/09/2017  ? Stable angina ([place]) 06/27/2014  ? Stroke [facility]) 03/2016  ? hx of PAD; j"ust lots of headaches since" (04/17/2016)  ? Vitamin B12 deficiency 06/27/2014  ? Weight loss, unintentional 06/27/2014  ? ? ?Social History  ? ?Tobacco Use  ? Smoking status: Former  ?  Years: 66.00  ?  Types: Cigarettes  ?  Start date: 06/23/2017  ? Smokeless tobacco: Never  ?Vaping Use  ? Vaping Use: Never used  ?Substance Use Topics  ? Alcohol use: Not Currently  ?  Comment: 04/17/2016 "nothing in the last 3-4 years"  ? Drug use: No  ? ? ?Family History  ?Problem  Relation Age of Onset  ? Hypertension Mother   ?     died age 92  ? Diabetes Mother   ? ?Scheduled Meds: ? apixaban  2.5 mg Oral BID  ? atorvastatin  80 mg Oral Daily  ? calcitRIOL  0.25 mcg Oral Daily  ? feeding supplement (NEPRO CARB STEADY)  237 mL Oral BID BM  ? finasteride  5 mg Oral Daily  ? melatonin  5 mg Oral QHS  ? multivitamin  1 tablet Oral QHS  ? pantoprazole  40 mg Oral Daily  ? sodium bicarbonate  1,300 mg Oral TID  ? tamsulosin  0.8 mg Oral Daily  ? ?Continuous  Infusions: ? cefTRIAXone (ROCEPHIN)  IV 2 g (03/30/22 1312)  ? dextrose 5 % and 0.45% NaCl 75 mL/hr at 03/30/22 1535  ? ?PRN Meds:.acetaminophen **OR** acetaminophen, camphor-menthol, white petrolatum ?Allergies  ?Allergen Reactions  ? Aspirin Other (See Comments)  ?  Nosebleeds   ? ? ?OBJECTIVE: ?Blood pressure (!) 120/53, pulse 80, temperature 97.9 ?F (36.6 ?C), temperature source Oral, resp. rate 16, height '6\' 2"'$  (1.88 m), weight 77 kg, SpO2 93 %. ? ?Physical Exam ?Constitutional:   ?   General: Derrick Hendrix is not in acute distress. ?   Appearance: Derrick Hendrix is normal weight. Derrick Hendrix is not toxic-appearing.  ?HENT:  ?   Head: Normocephalic and atraumatic.  ?   Comments: Poor dentition ?   Right Ear: External ear normal.  ?   Left Ear: External ear normal.  ?   Nose: No congestion or rhinorrhea.  ?   Mouth/Throat:  ?   Mouth: Mucous membranes are moist.  ?   Pharynx: Oropharynx is clear.  ?Eyes:  ?   Extraocular Movements: Extraocular movements intact.  ?   Conjunctiva/sclera: Conjunctivae normal.  ?   Pupils: Pupils are equal, round, and reactive to light.  ?Cardiovascular:  ?   Rate and Rhythm: Normal rate and regular rhythm.  ?   Heart sounds: No murmur heard. ?  No friction rub. No gallop.  ?Pulmonary:  ?   Effort: Pulmonary effort is normal.  ?   Breath sounds: Normal breath sounds.  ?Abdominal:  ?   General: Abdomen is flat. Bowel sounds are normal.  ?   Palpations: Abdomen is soft.  ?Musculoskeletal:     ?   General: No swelling. Normal range of motion.  ?   Cervical back: Normal range of motion and neck supple.  ?   Comments: Right great toe tender to palpation  ?Skin: ?   General: Skin is warm and dry.  ?Neurological:  ?   General: No focal deficit present.  ?   Mental Status: Derrick Hendrix is oriented to person, place, and time.  ?Psychiatric:     ?   Mood and Affect: Mood normal.  ? ? ?Lab Results ?Lab Results  ?Component Value Date  ? WBC 36.7 (H) 03/30/2022  ? HGB 9.0 (L) 03/30/2022  ? HCT 28.0 (L) 03/30/2022  ? MCV 96.2 03/30/2022   ? PLT 159 03/30/2022  ?  ?Lab Results  ?Component Value Date  ? CREATININE 6.32 (H) 03/30/2022  ? BUN 115 (H) 03/30/2022  ? NA 140 03/30/2022  ? K 4.3 03/30/2022  ? CL 111 03/30/2022  ? CO2 16 (L) 03/30/2022  ?  ?Lab Results  ?Component Value Date  ? ALT 9 03/27/2022  ? AST 14 (L) 03/27/2022  ? ALKPHOS 63 03/27/2022  ? BILITOT 0.7 03/27/2022  ?  ? ? ? ?Brion Hedges  Candiss Norse, MD ?[facility] for Infectious Disease ?[facility] ?03/30/2022, 4:37 PM  ?

## 2022-03-31 ENCOUNTER — Inpatient Hospital Stay (HOSPITAL_COMMUNITY): Payer: Medicare HMO

## 2022-03-31 DIAGNOSIS — A419 Sepsis, unspecified organism: Secondary | ICD-10-CM | POA: Diagnosis not present

## 2022-03-31 DIAGNOSIS — Z515 Encounter for palliative care: Secondary | ICD-10-CM

## 2022-03-31 DIAGNOSIS — Z66 Do not resuscitate: Secondary | ICD-10-CM

## 2022-03-31 DIAGNOSIS — N390 Urinary tract infection, site not specified: Secondary | ICD-10-CM | POA: Diagnosis not present

## 2022-03-31 DIAGNOSIS — Z789 Other specified health status: Secondary | ICD-10-CM

## 2022-03-31 DIAGNOSIS — R638 Other symptoms and signs concerning food and fluid intake: Secondary | ICD-10-CM

## 2022-03-31 DIAGNOSIS — E875 Hyperkalemia: Secondary | ICD-10-CM | POA: Diagnosis not present

## 2022-03-31 LAB — BASIC METABOLIC PANEL
Anion gap: 11 (ref 5–15)
BUN: 106 mg/dL — ABNORMAL HIGH (ref 8–23)
CO2: 18 mmol/L — ABNORMAL LOW (ref 22–32)
Calcium: 7.7 mg/dL — ABNORMAL LOW (ref 8.9–10.3)
Chloride: 110 mmol/L (ref 98–111)
Creatinine, Ser: 5.34 mg/dL — ABNORMAL HIGH (ref 0.61–1.24)
GFR, Estimated: 10 mL/min — ABNORMAL LOW (ref >60)
Glucose, Bld: 108 mg/dL — ABNORMAL HIGH (ref 70–99)
Potassium: 4.1 mmol/L (ref 3.5–5.1)
Sodium: 139 mmol/L (ref 135–145)

## 2022-03-31 LAB — CBC
HCT: 27.3 % — ABNORMAL LOW (ref 39.0–52.0)
Hemoglobin: 8.8 g/dL — ABNORMAL LOW (ref 13.0–17.0)
MCH: 30.3 pg (ref 26.0–34.0)
MCHC: 32.2 g/dL (ref 30.0–36.0)
MCV: 94.1 fL (ref 80.0–100.0)
Platelets: 160 10*3/uL (ref 150–400)
RBC: 2.9 MIL/uL — ABNORMAL LOW (ref 4.22–5.81)
RDW: 15.6 % — ABNORMAL HIGH (ref 11.5–15.5)
WBC: 20.9 10*3/uL — ABNORMAL HIGH (ref 4.0–10.5)
nRBC: 0.1 % (ref 0.0–0.2)

## 2022-03-31 MED ORDER — GLYCOPYRROLATE 0.2 MG/ML IJ SOLN: 0.2000 mg | INTRAMUSCULAR | Status: DC | PRN

## 2022-03-31 MED ORDER — AMOXICILLIN-POT CLAVULANATE 500-125 MG PO TABS
500.0000 mg | ORAL_TABLET | Freq: Two times a day (BID) | ORAL | Status: DC
  Filled 2022-03-31: qty 1

## 2022-03-31 MED ORDER — BIOTENE DRY MOUTH MT LIQD
15.0000 mL | Freq: Two times a day (BID) | OROMUCOSAL | Status: DC
  Administered 2022-03-31 – 2022-04-01 (×3): 15 mL via TOPICAL

## 2022-03-31 MED ORDER — ONDANSETRON 4 MG PO TBDP
4.0000 mg | ORAL_TABLET | Freq: Four times a day (QID) | ORAL | Status: DC | PRN
Start: 2022-03-31 — End: 2022-04-03

## 2022-03-31 MED ORDER — HALOPERIDOL LACTATE 2 MG/ML PO CONC
2.0000 mg | Freq: Four times a day (QID) | ORAL | Status: DC | PRN
  Filled 2022-03-31: qty 1

## 2022-03-31 MED ORDER — GLYCOPYRROLATE 1 MG PO TABS
1.0000 mg | ORAL_TABLET | ORAL | Status: DC | PRN
  Filled 2022-03-31: qty 1

## 2022-03-31 MED ORDER — ONDANSETRON HCL 4 MG/2ML IJ SOLN: 4.0000 mg | Freq: Four times a day (QID) | INTRAMUSCULAR | Status: DC | PRN

## 2022-03-31 MED ORDER — POLYVINYL ALCOHOL 1.4 % OP SOLN
1.0000 [drp] | Freq: Four times a day (QID) | OPHTHALMIC | Status: DC | PRN
  Filled 2022-03-31: qty 15

## 2022-03-31 MED ORDER — BUPIVACAINE LIPOSOME 1.3 % IJ SUSP
INTRAMUSCULAR | Status: AC
Start: 2022-03-31 — End: ?
  Filled 2022-03-31: qty 20

## 2022-03-31 MED ORDER — DIPHENHYDRAMINE HCL 50 MG/ML IJ SOLN: 12.5000 mg | INTRAMUSCULAR | Status: DC | PRN

## 2022-03-31 MED ORDER — AMOXICILLIN-POT CLAVULANATE 875-125 MG PO TABS: 1.0000 | ORAL_TABLET | Freq: Two times a day (BID) | ORAL | Status: DC

## 2022-03-31 MED ORDER — LORAZEPAM 2 MG/ML PO CONC
1.0000 mg | ORAL | Status: DC | PRN
  Filled 2022-03-31: qty 0.5

## 2022-03-31 MED ORDER — HALOPERIDOL LACTATE 5 MG/ML IJ SOLN: 2.0000 mg | Freq: Four times a day (QID) | INTRAMUSCULAR | Status: DC | PRN

## 2022-03-31 MED ORDER — AMLODIPINE BESYLATE 10 MG PO TABS
10.0000 mg | ORAL_TABLET | Freq: Every day | ORAL | Status: DC
  Administered 2022-03-31 – 2022-04-01 (×2): 10 mg via ORAL
  Filled 2022-03-31 (×3): qty 1

## 2022-03-31 MED ORDER — HYDROMORPHONE HCL 1 MG/ML IJ SOLN: 0.5000 mg | INTRAMUSCULAR | Status: DC | PRN

## 2022-03-31 MED ORDER — LORAZEPAM 2 MG/ML IJ SOLN: 1.0000 mg | INTRAMUSCULAR | Status: DC | PRN

## 2022-03-31 MED ORDER — METRONIDAZOLE 500 MG PO TABS
500.0000 mg | ORAL_TABLET | Freq: Two times a day (BID) | ORAL | Status: DC
  Administered 2022-03-31: 500 mg via ORAL
  Filled 2022-03-31: qty 1

## 2022-03-31 MED ORDER — LORAZEPAM 1 MG PO TABS: 1.0000 mg | ORAL_TABLET | ORAL | Status: DC | PRN

## 2022-03-31 MED ORDER — METRONIDAZOLE 500 MG PO TABS
500.0000 mg | ORAL_TABLET | Freq: Two times a day (BID) | ORAL | Status: AC
  Administered 2022-03-31: 500 mg via ORAL
  Filled 2022-03-31: qty 1

## 2022-03-31 MED ORDER — NEPRO/CARBSTEADY PO LIQD: 237.0000 mL | Freq: Every day | ORAL | Status: DC | PRN

## 2022-03-31 MED ORDER — HALOPERIDOL 1 MG PO TABS
2.0000 mg | ORAL_TABLET | Freq: Four times a day (QID) | ORAL | Status: DC | PRN
  Filled 2022-03-31: qty 2

## 2022-03-31 MED ORDER — GABAPENTIN 100 MG PO CAPS
100.0000 mg | ORAL_CAPSULE | Freq: Every day | ORAL | Status: DC
  Administered 2022-03-31 – 2022-04-01 (×2): 100 mg via ORAL
  Filled 2022-03-31 (×2): qty 1

## 2022-03-31 MED ORDER — AMOXICILLIN-POT CLAVULANATE 500-125 MG PO TABS
500.0000 mg | ORAL_TABLET | Freq: Two times a day (BID) | ORAL | Status: DC
  Administered 2022-04-01 – 2022-04-02 (×3): 500 mg via ORAL
  Filled 2022-03-31 (×4): qty 1

## 2022-03-31 NOTE — Care Management Important Message (Signed)
Important Message ? ?Patient Details  ?Name: Derrick Hendrix. ?MRN: [id number] ?Date of Birth: Sep 23, 1941 ? ? ?Medicare Important Message Given:  Yes ? ? ? ? ?[name] ?03/31/2022, 2:47 PM ?

## 2022-03-31 NOTE — Progress Notes (Signed)
[facility] ([facility]) Hospital Liaison Note  Referral received for patient/family interest in [facility]. Chart under review by [facility] physician.   Hospice eligibility pending.   Please call with any questions or concerns. Thank you   Shanita Wicker, LCSW [facility] Hospital Liaison  [phone] 

## 2022-03-31 NOTE — Progress Notes (Addendum)
?                                                                                                                                                     ?                                                   ?Daily Progress Note  ? ?Patient Name: Derrick Hendrix.       Date: 03/31/2022 ?DOB: 20-May-1941  Age: 81 y.o. MRN#: [id number] ?Attending Physician: Campbell Riches, MD ?Primary Care Physician: [name], MD ?Admit Date: 03/26/2022 ? ?Reason for Consultation/Follow-up: Establishing goals of care ? ?Subjective: ?Received notification from [name] that she is unable to make 1:00p meeting - rescheduled for 3:30p. ? ?Chart review performed. Received report from primary RN - no acute concerns. Reports patient is not eating but is taking in some liquids. ? ?3:30p ?Went to visit patient at bedside - [name] present. Patient was lying in bed awake, alert, oriented, and able to participate in conversation - he is hard to understand at time. No signs or non-verbal gestures of pain or discomfort noted. No respiratory distress, increased work of breathing, or secretions noted. Patient does endorse pain in his jaw and states it is hard to swallow. ? ?Emotional support provided. Reviewed orthopantogram showed possible mandibular abscess and dentistry was consulted.  ? ?We discussed patient's current illness and what it means in the larger context of patient's on-going co-morbidities. Patient and [name] have a clear understanding of his worsening kidney function. Reviewed lab work per their request. Patient is clear again today he does not wish to pursue dialysis. Natural disease trajectory and expectations at EOL were discussed. I attempted to elicit values and goals of care important to the patient. The difference between aggressive medical intervention and comfort care was considered in light of the patient's goals of care. Patient is no longer able to live at home alone.  ? ?Provided education and counseling  at length on the philosophy and benefits of hospice care. Discussed that it offers a holistic approach to care in the setting of end-stage illness, and is about supporting the patient where they are allowing nature to take it's course. Discussed the hospice team includes RNs, physicians, social workers, and chaplains. They can provide personal care, support for the family, and help keep patient out of the hospital as well as assist with DME needs for home hospice. Education provided on the difference between home vs residential hospice. We also discussed options of LTC with hospice vs residential hospice. Patient/family are interested in residential hospice transfer - requesting [facility]. ? ?We talked about transition to comfort measures in house and  what that would entail inclusive of medications to control pain, dyspnea, agitation, nausea, and itching. We discussed stopping all unnecessary measures such as blood draws, needle sticks, oxygen, antibiotics, CBGs/insulin, cardiac monitoring, IVF, and frequent vital signs. Patient is agreeable for transition to full comfort care today - will continue antibiotics for UTI for comfort.  ? ?Discussed with patient/family the importance of continued conversation with each other and the medical providers regarding overall plan of care and treatment options, ensuring decisions are within the context of the patient?s values and GOCs.   ? ?Questions and concerns were addressed. The patient/family was encouraged to call with questions and/or concerns. PMT card was provided. ? ?All questions and concerns addressed. Encouraged to call with questions and/or concerns. PMT card provided. ? ?Length of Stay: 4 ? ?Current Medications: ?Scheduled Meds:  ? amLODipine  10 mg Oral Daily  ? apixaban  2.5 mg Oral BID  ? atorvastatin  80 mg Oral Daily  ? calcitRIOL  0.25 mcg Oral Daily  ? feeding supplement (NEPRO CARB STEADY)  237 mL Oral BID BM  ? finasteride  5 mg Oral Daily  ? melatonin   5 mg Oral QHS  ? metroNIDAZOLE  500 mg Oral Q12H  ? multivitamin  1 tablet Oral QHS  ? pantoprazole  40 mg Oral Daily  ? sodium bicarbonate  1,300 mg Oral TID  ? tamsulosin  0.8 mg Oral Daily  ? ? ?Continuous Infusions: ? cefTRIAXone (ROCEPHIN)  IV 2 g (03/30/22 1312)  ? ? ?PRN Meds: ?acetaminophen **OR** acetaminophen, camphor-menthol, white petrolatum ? ?Physical Exam ?Vitals and nursing note reviewed.  ?Constitutional:   ?   General: He is not in acute distress. ?   Appearance: He is ill-appearing.  ?Pulmonary:  ?   Effort: No respiratory distress.  ?Skin: ?   General: Skin is warm and dry.  ?Neurological:  ?   Mental Status: He is alert and oriented to person, place, and time.  ?   Motor: Weakness present.  ?Psychiatric:     ?   Attention and Perception: Attention normal.     ?   Behavior: Behavior is cooperative.     ?   Cognition and Memory: Cognition and memory normal.  ?         ? ?Vital Signs: BP (!) 179/83   Pulse 87   Temp 98 ?F (36.7 ?C)   Resp 19   Ht '6\' 2"'$  (1.88 m)   Wt 77 kg   SpO2 99%   BMI 21.80 kg/m?  ?SpO2: SpO2: 99 % ?O2 Device: O2 Device: Room Air ?O2 Flow Rate: O2 Flow Rate (L/min): 2 L/min ? ?Intake/output summary:  ?Intake/Output Summary (Last 24 hours) at 03/31/2022 1203 ?Last data filed at 03/31/2022 0800 ?Gross per 24 hour  ?Intake 956.61 ml  ?Output 655 ml  ?Net 301.61 ml  ? ?LBM: Last BM Date : 03/28/22 ?Baseline Weight: Weight: 79.3 kg ?Most recent weight: Weight: 77 kg ? ?     ?Palliative Assessment/Data: PPS 20% ? ? ? ? ? ?Patient Active Problem List  ? Diagnosis Date Noted  ? Urinary tract infection, E. coli 03/28/2022  ? Sepsis ([place]) 03/28/2022  ? Peripheral neuropathy 03/02/2022  ? Goals of care, counseling/discussion 11/30/2020  ? Dysphagia 08/23/2020  ? Psoriasis 04/23/2020  ? Normal anion gap metabolic acidosis   ? Atrial fibrillation ([place]) 10/14/2018  ? Type 2 diabetes mellitus ([place]) 09/04/2017  ? 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 Ischemic pain of foot, right   ?  Severe  peripheral arterial disease ([place]) 02/27/2017  ? Benign prostatic hyperplasia with urinary obstruction   ? Orthostatic hypotension 04/04/2016  ? Urinary retention 03/20/2016  ? Cerebrovascular accident (CVA) due to occlusion of left posterior cerebral artery ([place])   ? Constipation 11/07/2014  ? Essential hypertension   ? Normocytic anemia 10/20/2014  ? 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 Hyperkalemia 08/01/2014  ? Healthcare maintenance 07/13/2014  ? Atherosclerosis of leg with intermittent claudication, R 06/29/2014  ? Current smoker 06/27/2014  ? CKD (chronic kidney disease), stage V ([place]) 06/26/2014  ? ? ?Palliative Care Assessment & Plan  ? ?Patient Profile: ?81 y.o. male  with past medical history of CKD stage V with recurrent hyperkalemia, hypertension, hyperlipidemia, CVA with residual left-sided weakness and left-sided hemiballismus, A-fib on Eliquis, peripheral vascular disease and type 2 diabetes  admitted on 03/26/2022 with hyperkalemia and sepsis of unclear etiology. ? ?Assessment: ?Principal Problem: ?  Hyperkalemia ?Active Problems: ?  Urinary tract infection, E. coli ?  Sepsis ([place]) ?   Terminal care ? ?Recommendations/Plan: ?Initiated full comfort measures ?Continue DNR/DNI as previously documented ?No dialysis ?Patient/family requesting transfer to [facility] consult placed; TOC and hospice liaison notifed ?Added orders for EOL symptom management and to reflect full comfort measures, as well as discontinued orders that were not focused on comfort ?Unrestricted visitation orders were placed per current [facility] EOL visitation policy  ?Nursing to provide frequent assessments and administer PRN medications as clinically necessary to ensure EOL comfort ?PMT will continue to follow and support holistically ? ?Symptom Management ?Continue antibiotics for mandibular pain and UTI discomfort ?Continue amlodipine, Eliquis, calcitriol, proscar, melatonin, sodium bicarb while  tolerating POs - can discontinue as patient becomes more lethargic/closer to EOL ?Dilaudid PRN pain/dyspnea/distress/RR >25/increased work of breathing ?Tylenol PRN pain/fever ?Biotin twice daily ?Benadryl PRN itching

## 2022-03-31 NOTE — Progress Notes (Signed)
?   ? ? ? ? ?[facility] for Infectious Disease ? ?Date of Admission:  03/26/2022   Total days of inpatient antibiotics 5 ? ?Principal Problem: ?  Hyperkalemia ?Active Problems: ?  Urinary tract infection, E. coli ?  Sepsis ([place]) ?     ?    ?Assessment: ?81 year old male with history of osteomyelitis of right great toe, PAD status post bypass admitted for hyperkalemia in the setting of CKD 5, found to have sepsis secondary to UTI ?  ?#Leukocytosis-trending down ?#Sepsis secondary to E. coli UTI ?#History of right great toe osteomyelitis ?#CKD 5/admitted for hyperkalemia ?-Earlier in hospitalization noted to have leukopenia with WBC 1K now 33 K.  Urine cultures positive for E. coli on ceftriaxone. ?CT on 4/15 showed no focal abscess, low and high density lesions bilateral kidneys noted.  Small bilateral pleural effusions noted. Renal US did not show any fluid collection/abscess ?-Diff added to cbc, to look at possible eosinophilia which was not present ? ? ?Recommendations:  ?-Continue ceftriaxone ?-Orthopantogram showed possible mandibular abscess. Engage Dentistry ?-Right great toe tender ot palpation-MRI right foot ordered.  ? ?Microbiology:   ?Antibiotics: ?Cefepime 4/12 ?Ceftriaxone 4/13-p ?Metronidazole 4/13 ?Vancomycin 4/12 ?  ?Cultures: ?Blood ?4/12 NG ?4/13 NG ?Urine ?4/12 Ecoli ? ? ?SUBJECTIVE: ?Resting in bed. No new complaints.  ?Interval Wbc 20K, pt afebrile.  ?Review of Systems: ?ROS ? ? ?Scheduled Meds: ? amLODipine  10 mg Oral Daily  ? apixaban  2.5 mg Oral BID  ? atorvastatin  80 mg Oral Daily  ? calcitRIOL  0.25 mcg Oral Daily  ? feeding supplement (NEPRO CARB STEADY)  237 mL Oral BID BM  ? finasteride  5 mg Oral Daily  ? melatonin  5 mg Oral QHS  ? metroNIDAZOLE  500 mg Oral Q12H  ? multivitamin  1 tablet Oral QHS  ? pantoprazole  40 mg Oral Daily  ? sodium bicarbonate  1,300 mg Oral TID  ? tamsulosin  0.8 mg Oral Daily  ? ?Continuous Infusions: ? cefTRIAXone (ROCEPHIN)  IV 2 g (03/31/22 1325)   ? ?PRN Meds:.acetaminophen **OR** acetaminophen, camphor-menthol, white petrolatum ?Allergies  ?Allergen Reactions  ? Aspirin Other (See Comments)  ?  Nosebleeds   ? ? ?OBJECTIVE: ?Vitals:  ? 03/30/22 2151 03/31/22 0501 03/31/22 0922 03/31/22 1000  ?BP: (!) 158/79 (!) 156/81 (!) 190/107 (!) 179/83  ?Pulse: 78 73 87   ?Resp: '18 18 19   '$ ?Temp: 97.7 ?F (36.5 ?C) 98.5 ?F (36.9 ?C) 98 ?F (36.7 ?C)   ?TempSrc:      ?SpO2: 95% 97% 99%   ?Weight:  77 kg    ?Height:      ? ?Body mass index is 21.8 kg/m?. ? ?Physical Exam ?Constitutional:   ?   General: He is not in acute distress. ?   Appearance: He is normal weight. He is not toxic-appearing.  ?HENT:  ?   Head: Normocephalic and atraumatic.  ?   Comments: Poor dentiitoin ?   Right Ear: External ear normal.  ?   Left Ear: External ear normal.  ?   Nose: No congestion or rhinorrhea.  ?   Mouth/Throat:  ?   Mouth: Mucous membranes are moist.  ?   Pharynx: Oropharynx is clear.  ?Eyes:  ?   Extraocular Movements: Extraocular movements intact.  ?   Conjunctiva/sclera: Conjunctivae normal.  ?   Pupils: Pupils are equal, round, and reactive to light.  ?Cardiovascular:  ?   Rate and Rhythm: Normal rate and  regular rhythm.  ?   Heart sounds: No murmur heard. ?  No friction rub. No gallop.  ?Pulmonary:  ?   Effort: Pulmonary effort is normal.  ?   Breath sounds: Normal breath sounds.  ?Abdominal:  ?   General: Abdomen is flat. Bowel sounds are normal.  ?   Palpations: Abdomen is soft.  ?Musculoskeletal:     ?   General: No swelling. Normal range of motion.  ?   Cervical back: Normal range of motion and neck supple.  ?Skin: ?   General: Skin is warm and dry.  ?Neurological:  ?   General: No focal deficit present.  ?   Mental Status: He is oriented to person, place, and time.  ?Psychiatric:     ?   Mood and Affect: Mood normal.  ? ? ? ? ?Lab Results ?Lab Results  ?Component Value Date  ? WBC 20.9 (H) 03/31/2022  ? HGB 8.8 (L) 03/31/2022  ? HCT 27.3 (L) 03/31/2022  ? MCV 94.1 03/31/2022   ? PLT 160 03/31/2022  ?  ?Lab Results  ?Component Value Date  ? CREATININE 5.34 (H) 03/31/2022  ? BUN 106 (H) 03/31/2022  ? NA 139 03/31/2022  ? K 4.1 03/31/2022  ? CL 110 03/31/2022  ? CO2 18 (L) 03/31/2022  ?  ?Lab Results  ?Component Value Date  ? ALT 9 03/27/2022  ? AST 14 (L) 03/27/2022  ? ALKPHOS 63 03/27/2022  ? BILITOT 0.7 03/27/2022  ?  ? ? ? ? ?Laurice Record, MD ?[facility] for Infectious Disease ?[facility] ?03/31/2022, 1:37 PM  ?

## 2022-03-31 NOTE — Progress Notes (Signed)
?  Transition of Care (TOC) Screening Note ? ? ?Patient Details  ?Name: Derrick Hendrix. ?Date of Birth: December 07, 1941 ? ? ?Transition of Care (TOC) CM/SW Contact:    ?Tom-Johnson, Renea Ee, RN ?Phone Number: ?03/31/2022, 1:11 PM ? ?Patient is from home with family. Admitted for Sepsis 2/2 UTI. Has Hx of CKD stage V. ID following for Leukocytosis. On day for of IV abt. Pending MRI for Rt foot. Palliative following for Goals Of Care. ?Transition of Care Department [facility]) has reviewed patient and no TOC needs or recommendations have been identified at this time. TOC will continue to monitor patient advancement through interdisciplinary progression rounds. If new patient transition needs arise, please place a TOC consult. ?  ?

## 2022-03-31 NOTE — Progress Notes (Signed)
? ?Subjective: ? ?NAEO. Patient drowsy while at bedside. He states he is feeling well today and would like to rest. States he is still feeling cold, but was given extra blankets that has helped. Today endorses some pain in teeth when chewing and some pain in R big toe when walking. States he has no pain while at rest.   ? ?States he has been peeing more, no dysuria or frequency, no suprapubic pain. Patient states he will try to drink and eat more today. Woke up and sat up in bed to eat lunch while in room. No further concerns. ? ?Objective: ? ?Vital signs in last 24 hours: ?Vitals:  ? 03/30/22 1053 03/30/22 1609 03/30/22 2151 03/31/22 0501  ?BP: 136/73 (!) 120/53 (!) 158/79 (!) 156/81  ?Pulse: 80 80 78 73  ?Resp:  '16 18 18  '$ ?Temp:  97.9 ?F (36.6 ?C) 97.7 ?F (36.5 ?C) 98.5 ?F (36.9 ?C)  ?TempSrc:  Oral    ?SpO2:  93% 95% 97%  ?Weight:    77 kg  ?Height:      ? ?Weight change: -0.01 kg ? ?Intake/Output Summary (Last 24 hours) at 03/31/2022 0845 ?Last data filed at 03/31/2022 0600 ?Gross per 24 hour  ?Intake 776.61 ml  ?Output 780 ml  ?Net -3.39 ml  ? ?Physical Exam: ?Constitutional: Thin chronically appearing male laying in bed, drowsy but responsive to questioning, NAD ?HEENT: atraumatic, normocephalic, poor dentition, with no active signs of infection, no abscesses or bleeding ?Cardio: normal rate and rhythm, no murmurs or rubs ?Pulm: exam limited by patient position, CTAB, normal work of breathing on room air, no wheezing ?Abd: soft, nondistended, nontender, +bowel sounds ?Extremities: moving all extremities spontaneously, no edema, normal skin turgor ?Neuro: alert and oriented x3, answering questions appropriately ?Psych: normal mood, lethargic affect ? ?Assessment/Plan: ? ?Principal Problem: ?  Hyperkalemia ?Active Problems: ?  Urinary tract infection, E. coli ?  Sepsis ([place]) ? ?Mr. Gurdeep, Keesey. is a 81 year old male with a past medical history significant for nonoliguric CKD stage V with recurrent  hyperkalemia, hypertension, hyperlipidemia, CVA with residual L sided weakness and L sided hemiballismus, Afib on eliquis, peripheral vascular disease status post right SFA intervention in 2018, type 2 diabetes complicated by peripheral neuropathy who presented for hyperkalemia now complicated by sepsis secondary to UTI. ?  ?#Sepsis 2/2 UTI 2/2 E. coli, improving  ?Sepsis likely secondary to UTI, currently being treated with IV ceftriaxone 2g daily. He remains afebrile and is no longer hypotensive, sepsis improving.  He endorses feeling cold but is no longer shaking on exam. He has remained afebrile with improving leukocytosis today and no suprapubic pain or dysuria today. ID was consulted given continued uptrend in WBC despite appropriate treatment, patient further worked up for additional sources of infection. Renal US confirmed bilateral renal cysts previously seen on CT, requiring no further work up and renal changes consistent with CKD. No eosinophilia seen on CBC differential. Orthopantogram was done, given patients poor dentition, multiple cavitations and likely broken teeth identified with presence of a lucency involving the pre molar in the left mandible concerning for possible abscess versus granulation tissue identified. Patient with some dental pain today with chewing but no acute signs of infection, will likely require dental follow up outpatient. He has prn Tylenol on board for pain. MRI foot with no evidence of acute osteomyelitis. Given improvement in leukocytosis and negative workup for additional sources of infection, there is low concern for additional untreated infection. Will continue to monitor response  on current antibiotic regimen, ID following, appreciate recs. Blood cultures currently with no growth x4 days. Palliative following for CKD, as below, with continued goals of care discussions. ?-Continue IV ceftriaxone 2g daily ?-ID consulted, appreciated recs ?-f/u Blood cultures: NGTD x 4  days ?-f/u urine cultures: >100,000 colonies/mL E. coli. ?  ?#CKD stage V (non oliguric) ?Per patient's niece he follows with nephrology and was evaluated by vascular surgery.  We do not have access to these records.  Patient's niece states that he was told that his left upper extremity would not be suitable for AV fistula and patient did not want to entertain other sites. Patient remains nonoliguric with BUN elevated to 110s and creatinine of 5.34. Patient is chronically at this level and asymptomatic. Nephrology was consulted and signed off, as patient and niece do not want intervention. He is endorsing decreased po due to poor appetite and bitter taste in mouth today likely 2/2 uremia in setting of CKD and tooth pain. He was given fluids as below and is currently on supplemental feeds with Nepro. Given end stage renal disease, patient is candidate for hospice care. Palliative care was consulted and currently following with plans for continued goals of care discussion today with patient and family.  ?-Post fluids 27m/hr D5 1/2 NS for 10hrs ?-Avoid nephrotoxic agents ?-Palliative consulted, appreciate recommendations ?  ?#Paroxysmal atrial fibrillation ?Patient remains in NSR. Currently on anticoagulation with Eliquis for atrial fibrillation. Will continue home meds. ?-Continue Eliquis 2.'5mg'$  BID ?  ?#Hypertension ?Patient previously hypotensive likely in the setting of sepsis. Today BP elevated, he is now hypertensive to 190/107. BP was rechecked and found to be 179/83. Will restart home amlodipine as below, continue to hold hydralazine. Will consider restarting all antihypertensives if BP continues to increase.  ?-Restart amlodipine '10mg'$  daily ?-Hold home hydralazine  ?  ?#Chronic anemia, macrocytic ?Recent iron studies 3/31 are remarkable for iron deficiency.  Most likely related to chronic kidney disease. Vitamin B12 low-normal and folate wnl. Hgb decreased today to 8.8 from 9.0 (4/16), could be secondary to  hemodilution post fluids as above. Will CTM. ?- CTM on daily CBC ?- We will hold off on iron infusion at this time due to active infection ?  ?#Hyperlipidemia ?- Continue home atorvastatin ?  ?#T2DM c/b peripheral neuropathy ?Patient with history of diabetes however most recent A1c within normal limits.  In addition patient is currently not on any antidiabetic medications. Glucose improved today to 108 post ~1L D5 1/2 NS. Patient previously running low likely secondary to poor po intake as above. He is euvolemic on exam today, will reassess patient tomorrow for need for additional fluids. On gabapentin 100 mg nightly for his neuropathic pain.  ?-CBG monitoring ?--Post fluids 758mhr D5 1/2 NS for 10hrs ?--Hold gabapentin '100mg'$  qhs ?  ?#PAD s/p R SFA nitinol stent in 2018 ?Most recent ABIs were indeterminant due to patient motion.  No concern for acute peripheral vascular disease. Continue medical management per above ?  ?#Hx CVA w/ residual L sided weakness ?Patient reports some residual left-sided weakness as well as some left-sided hemiballismus.  On exam his left upper extremity and left lower extremity strength were 5 out of 5.  It appears patient is currently not on a antiplatelet agent but is on atorvastatin as above. ?  ?Prior to Admission Living Arrangement: Home ?Anticipated Discharge Location: Pending; Palliative on board ?Barriers to Discharge: Clinical improvement ?Dispo: Anticipated discharge in approximately 3-5 day(s).  ? ? LOS: 4 days  ? ?  Rodriguez-Teodoro, Quintella Reichert, Medical Student ?03/31/2022, 8:45 AM  ?

## 2022-03-31 NOTE — Progress Notes (Signed)
PHARMACY NOTE:  ANTIMICROBIAL RENAL DOSAGE ADJUSTMENT ? ?Current antimicrobial regimen includes a mismatch between antimicrobial dosage and estimated renal function.  As per policy approved by the Pharmacy & Therapeutics and Medical Executive Committees, the antimicrobial dosage will be adjusted accordingly. ? ?Current antimicrobial dosage:  Augmentin 875/125 mg tablet BID ? ?Indication: Ecoli UTI ? ?Renal Function: ?Estimated Creatinine Clearance: 11.4 mL/min (A) (by C-G formula based on SCr of 5.34 mg/dL (H)). ?   ?Antimicrobial dosage has been changed to:  Augmentin 500/125 mg tablet BID ? ?Thank you for allowing pharmacy to be a part of this patient's care. ? ?Sherlon Handing, PharmD, BCPS ?Please see amion for complete clinical pharmacist phone list ?03/31/2022 4:32 PM ?

## 2022-04-01 ENCOUNTER — Ambulatory Visit: Payer: Self-pay

## 2022-04-01 ENCOUNTER — Telehealth: Payer: Medicare HMO

## 2022-04-01 DIAGNOSIS — N390 Urinary tract infection, site not specified: Secondary | ICD-10-CM | POA: Diagnosis not present

## 2022-04-01 DIAGNOSIS — B9620 Unspecified Escherichia coli [E. coli] as the cause of diseases classified elsewhere: Secondary | ICD-10-CM | POA: Diagnosis not present

## 2022-04-01 DIAGNOSIS — E875 Hyperkalemia: Secondary | ICD-10-CM | POA: Diagnosis not present

## 2022-04-01 LAB — CULTURE, BLOOD (ROUTINE X 2)
Culture: NO GROWTH
Culture: NO GROWTH
Special Requests: ADEQUATE
Special Requests: ADEQUATE

## 2022-04-01 NOTE — Chronic Care Management (AMB) (Signed)
Care Management    RN Visit Note  04/01/2022 Name: Derrick Hendrix. MRN: [id number] DOB: 09-26-1941  Subjective: Derrick Hendrix. is a 81 y.o. year old male who is a primary care patient of Derrick Kanner, MD. The care management team was consulted for assistance with disease management and care coordination needs.    Collaboration with acute hospital  for follow up visit in response to provider referral for case management and/or care coordination services.   Consent to Services:   Derrick Hendrix was given information about Care Management services today including:  Care Management services includes personalized support from designated clinical staff supervised by his physician, including individualized plan of care and coordination with other care providers 24/7 contact phone numbers for assistance for urgent and routine care needs. The patient may stop case management services at any time by phone call to the office staff.  Patient agreed to services and consent obtained.   Assessment: Review of patient past medical history, allergies, medications, health status, including review of consultants reports, laboratory and other test data, was performed as part of comprehensive evaluation and provision of chronic care management services.   SDOH (Social Determinants of Health) assessments and interventions performed:    Care Plan  Allergies  Allergen Reactions   Aspirin Other (See Comments)    Nosebleeds     Facility-Administered Encounter Medications as of 04/01/2022  Medication   acetaminophen (TYLENOL) tablet 650 mg   Or   acetaminophen (TYLENOL) suppository 650 mg   amoxicillin-clavulanate (AUGMENTIN) 500-125 MG per tablet 500 mg   antiseptic oral rinse (BIOTENE) solution 15 mL   camphor-menthol (SARNA) lotion   diphenhydrAMINE (BENADRYL) injection 12.5 mg   finasteride (PROSCAR) tablet 5 mg   gabapentin (NEURONTIN) capsule 100 mg   glycopyrrolate (ROBINUL) tablet 1 mg    Or   glycopyrrolate (ROBINUL) injection 0.2 mg   Or   glycopyrrolate (ROBINUL) injection 0.2 mg   haloperidol (HALDOL) tablet 2 mg   Or   haloperidol (HALDOL) 2 MG/ML solution 2 mg   Or   haloperidol lactate (HALDOL) injection 2 mg   HYDROmorphone (DILAUDID) injection 0.5-1 mg   LORazepam (ATIVAN) tablet 1 mg   Or   LORazepam (ATIVAN) 2 MG/ML concentrated solution 1 mg   Or   LORazepam (ATIVAN) injection 1 mg   melatonin tablet 5 mg   ondansetron (ZOFRAN-ODT) disintegrating tablet 4 mg   Or   ondansetron (ZOFRAN) injection 4 mg   polyvinyl alcohol (LIQUIFILM TEARS) 1.4 % ophthalmic solution 1 drop   tamsulosin (FLOMAX) capsule 0.8 mg   white petrolatum (VASELINE) gel   Outpatient Encounter Medications as of 04/01/2022  Medication Sig   amLODipine (NORVASC) 10 MG tablet Take 1 tablet (10 mg total) by mouth daily.   apixaban (ELIQUIS) 2.5 MG TABS tablet TAKE ONE TABLET BY MOUTH TWICE A DAY (Patient taking differently: Take 2.5 mg by mouth 2 (two) times daily.)   atorvastatin (LIPITOR) 80 MG tablet TAKE ONE TABLET BY MOUTH DAILY (Patient taking differently: Take 80 mg by mouth daily.)   Blood Pressure Monitoring (BLOOD PRESSURE KIT) DEVI Please check blood pressure twice a day. In the morning and evening. Please record numbers on a sheet of paper. Blood pressure parameters are 120-150/80-90   clobetasol cream (TEMOVATE) 0.05 % APPLY TOPICALLY TWO (TWO) TIMES DAILY. (Patient not taking: Reported on 03/27/2022)   docusate sodium (COLACE) 100 MG capsule Take 1 capsule (100 mg total) by mouth daily. (Patient not taking: Reported on 03/27/2022)  finasteride (PROSCAR) 5 MG tablet TAKE ONE TABLET BY MOUTH DAILY (Patient taking differently: Take 5 mg by mouth daily.)   furosemide (LASIX) 40 MG tablet TAKE ONE TABLET BY MOUTH TWICE A DAY (Patient taking differently: Take 40 mg by mouth 2 (two) times daily.)   gabapentin (NEURONTIN) 100 MG capsule Take 1 capsule (100 mg total) by mouth at  bedtime.   hydrALAZINE (APRESOLINE) 10 MG tablet TAKE 1.5 TABLETS BY MOUTH THREE TIMES A DAY (Patient taking differently: Take 15 mg by mouth 3 (three) times daily.)   nitroGLYCERIN (NITROSTAT) 0.4 MG SL tablet Place 1 tablet (0.4 mg total) under the tongue every 5 (five) minutes x 3 doses as needed for chest pain.   omeprazole (PRILOSEC) 40 MG capsule TAKE ONE CAPSULE SHORTLY BEFORE BREAKFAST EACH MORNING (Patient taking differently: Take 40 mg by mouth daily.)   sodium bicarbonate 650 MG tablet TAKE TWO TABLETS BY MOUTH TWICE A DAY (Patient taking differently: Take 1,300 mg by mouth 2 (two) times daily.)   tamsulosin (FLOMAX) 0.4 MG CAPS capsule TAKE TWO CAPSULES BY MOUTH DAILY (Patient taking differently: Take 0.8 mg by mouth daily.)   VELTASSA 8.4 g packet Take 1 packet by mouth daily. (Patient not taking: Reported on 03/27/2022)    Patient Active Problem List   Diagnosis Date Noted   Urinary tract infection, E. coli 03/28/2022   Sepsis (HCC) 03/28/2022   Peripheral neuropathy 03/02/2022   Goals of care, counseling/discussion 11/30/2020   Dysphagia 08/23/2020   Psoriasis 04/23/2020   Normal anion gap metabolic acidosis    Atrial fibrillation (HCC) 10/14/2018   Type 2 diabetes mellitus (HCC) 09/04/2017   Hemiballism 08/31/2017   Ischemic pain of foot, right    Severe peripheral arterial disease (HCC) 02/27/2017   Benign prostatic hyperplasia with urinary obstruction    Orthostatic hypotension 04/04/2016   Urinary retention 03/20/2016   Cerebrovascular accident (CVA) due to occlusion of left posterior cerebral artery (HCC)    Constipation 11/07/2014   Essential hypertension    Normocytic anemia 10/20/2014   Hyporeninemic hypoaldosteronism (HCC) 08/24/2014   Hyperkalemia 08/01/2014   Healthcare maintenance 07/13/2014   Atherosclerosis of leg with intermittent claudication, R 06/29/2014   Current smoker 06/27/2014   CKD (chronic kidney disease), stage V (HCC) 06/26/2014     Conditions to be addressed/monitored: CKD Stage 5  Care Plan : CCM RN- Level of care concerns  Updates made by Jodelle Gross, RN since 04/01/2022 12:00 AM     Problem: Patient needs assistance with ADLS and IADLS.   Priority: High  Onset Date: 11/19/2020     Goal: CCM RN- Self-Management Plan Developed Completed 04/01/2022  Start Date: 11/19/2020  Expected End Date: 09/13/2021  Recent Progress: On track  Priority: High  Note:   Current Barriers: Review of inpatient notes and patient has transitioned to comfort care in acute hospital, he is waiting on a bed at [facility] facility.  Plan to close care plan.  Knowledge Deficits related to plan of care for management of HTN, DMII, and CKD Stage 5  Lacks caregiver support Environmental consultant barriers RNCM Clinical Goal(s):  Patient will take all medications exactly as prescribed and will call provider for medication related questions as evidenced by refill needs and timing or refills. continue to work with RN Care Manager to address care management and care coordination needs related to  HTN, DMII, and CKD Stage 5 as evidenced by adherence to CM Team Scheduled appointments work with Child psychotherapist to  address  related to the management of Limited social support, Transportation, and Housing barriers related to the management of HTN, DMII, and CKD Stage 5 as evidenced by review of EMR and patient or Child psychotherapist report through collaboration with Medical illustrator, provider, and care team.  Interventions: 1:1 collaboration with primary care provider regarding development and update of comprehensive plan of care as evidenced by provider attestation and co-signature Inter-disciplinary care team collaboration (see longitudinal plan of care) Evaluation of current treatment plan related to  self management and patient's adherence to plan as established by provider  Hypertension Interventions:  (Status:  Goal on track:   Yes.) Long Term Goal Last practice recorded BP readings:  BP Readings from Last 3 Encounters:  02/14/22 (!) 157/93  02/07/22 (!) 170/104  01/10/22 111/64  Most recent eGFR/CrCl: No results found for: EGFR  No components found for: CRCL  Evaluation of current treatment plan related to hypertension self management and patient's adherence to plan as established by provider Reviewed medications with patient and discussed importance of compliance Reviewed scheduled/upcoming provider appointments including:   Patient Goals/Self-Care Activities: Take all medications as prescribed Attend all scheduled provider appointments Perform all self care activities independently  Call provider office for new concerns or questions  Work with the social worker to address care coordination needs and will continue to work with the clinical team to address health care and disease management related needs  Interventions:  Inter-disciplinary care team collaboration (see longitudinal plan of care) Reviewed patient's clinical status related to ESRD and not a candidate for dialysis and services provided by Hospice-  Informed patient his problems with urination were not related to the food he ate but rather his end stage kidney disease Reinforced with patient that his health care team and niece want to honor his end of life wishes and maximize his quality of life via shared decision making with goal of keeping him comfortable in his home until he dies Will refer to CCM BSW to assist with removal of feral cats Reinforced the importance of patient notifying his niece or health care team at the time he wishes to stop aggressive care and focus on comfort care only  Follow Up Plan: Close care plan   Jodelle Gross, RN, BSN, CCM Care Management Coordinator [facility] Internal Medicine Phone: [phone]/Fax: [phone]

## 2022-04-01 NOTE — TOC Progression Note (Signed)
Transition of Care (TOC) - Progression Note  ? ? ?Patient Details  ?Name: Derrick Hendrix. ?MRN: [id number] ?Date of Birth: 1941/04/08 ? ?Transition of Care (TOC) CM/SW Contact  ?Tom-Johnson, Renea Ee, RN ?Phone Number: ?04/01/2022, 10:32 AM ? ?Clinical Narrative:    ? ?CM consulted for Residential Hospice placement. Family requesting Optometrist with [facility]. Shanita, Intake coordinator notified and acknowledged referral. No bed is available at this time. CM will continue to follow with car.   ? ?Expected Discharge Plan: [place] ?Barriers to Discharge: Hospice Bed not available ? ?Expected Discharge Plan and Services ?Expected Discharge Plan: [place] ?  ?Discharge Planning Services: CM Consult ?  ?Living arrangements for the past 2 months: [place] ?                ?  ?  ?  ?  ?  ?HH Arranged: NA ?[place] Agency: Other - See comment ([facility], [place]) ?  ?  ?  ? ? ?Social Determinants of Health (SDOH) Interventions ?  ? ?Readmission Risk Interventions ?   ? View : No data to display.  ?  ?  ?  ? ? ?

## 2022-04-01 NOTE — Progress Notes (Addendum)
?                                                                                                                                                     ?                                                   ?Daily Progress Note  ? ?Patient Name: Derrick Hendrix.       Date: 04/01/2022 ?DOB: [birth year]  Age: 81 y.o. MRN#: [id number] ?Attending Physician: Campbell Riches, MD ?Primary Care Physician: [name], MD ?Admit Date: 03/26/2022 ? ?Reason for Consultation/Follow-up: Establishing goals of care ? ?Subjective: ?Medical records reviewed. Patient assessed at the bedside. He is aggravated that he is still waiting on his lunch. Reports headache due to this, which has been treated with Tylenol and denies stronger medications. Reporting some itching which he will try lotion for. No family present during my visit. ? ?Patient shares that he has no preference for discharge planning as [facility] is unable to offer a bed. He trusts his niece Tracey's opinion and agrees to this PA reaching out and discussing options of residential hospice vs SNF with hospice. ? ?I then called patient's niece and we reviewed options of other residential hospice facilities or SNF with hospice closer to family for visitation. She prefers SNF given her schedule limitations and inability to drive further. Reviewed referral process and questions regarding expense and coorination to niece's satisfaction. ? ?528pm Returned to bedside to continue discussion with patient and his niece. We reviewed current medications, discontinued medications, and possible causes of patient's toe pain including gout. Discussed workup versus symptom management with current goal of ensuring comfort. Discussed recent workup including orthopantogram with possible dental abscess, treatment with abx, and recent labs. [name] understands and continues to desire to support patient in overall focus on comfort. We also discussed additional questions regarding SNF with  hospice including preferred location and whether patient can leave facility for activities he enjoys. [name] notes that patient received recall notice for finasteride and would like to look into this, ensuring it is safe for patient to continue. Discussed option to discontinue at this time but deferred.  ? ?Questions and concerns addressed. PMT will continue to support holistically.  ? ?Length of Stay: 5 ? ?Current Medications: ?Scheduled Meds:  ? amoxicillin-clavulanate  500 mg Oral BID  ? antiseptic oral rinse  15 mL Topical BID  ? finasteride  5 mg Oral Daily  ? gabapentin  100 mg Oral QHS  ? melatonin  5 mg Oral QHS  ? tamsulosin  0.8 mg Oral Daily  ? ? ?PRN Meds: ?acetaminophen **OR** acetaminophen, camphor-menthol, diphenhydrAMINE, glycopyrrolate **OR**  glycopyrrolate **OR** glycopyrrolate, haloperidol **OR** haloperidol **OR** haloperidol lactate, HYDROmorphone (DILAUDID) injection, LORazepam **OR** LORazepam **OR** LORazepam, ondansetron **OR** ondansetron (ZOFRAN) IV, polyvinyl alcohol, white petrolatum ? ?Physical Exam ?Vitals and nursing note reviewed.  ?Constitutional:   ?   General: He is not in acute distress. ?   Appearance: He is ill-appearing.  ?Pulmonary:  ?   Effort: No respiratory distress.  ?Skin: ?   General: Skin is warm and dry.  ?Neurological:  ?   Mental Status: He is alert and oriented to person, place, and time.  ?   Motor: Weakness present.  ?Psychiatric:     ?   Attention and Perception: Attention normal.     ?   Behavior: Behavior is cooperative.     ?   Cognition and Memory: Cognition and memory normal.  ?         ? ?Vital Signs: BP (!) 165/84   Pulse 70   Temp 98.1 ?F (36.7 ?C) (Oral)   Resp 18   Ht '6\' 2"'$  (1.88 m)   Wt 77 kg   SpO2 96%   BMI 21.80 kg/m?  ?SpO2: SpO2: 96 % ?O2 Device: O2 Device: Room Air ?O2 Flow Rate: O2 Flow Rate (L/min): 2 L/min ? ?Intake/output summary:  ?Intake/Output Summary (Last 24 hours) at 04/01/2022 1412 ?Last data filed at 04/01/2022 1300 ?Gross per  24 hour  ?Intake 780 ml  ?Output 1100 ml  ?Net -320 ml  ? ? ?LBM: Last BM Date : 04/01/22 ?Baseline Weight: Weight: 79.3 kg ?Most recent weight: Weight: 77 kg ? ?     ?Palliative Assessment/Data: PPS 20% ? ? ? ? ? ?Patient Active Problem List  ? Diagnosis Date Noted  ? Urinary tract infection, E. coli 03/28/2022  ? Sepsis ([place]) 03/28/2022  ? Peripheral neuropathy 03/02/2022  ? Goals of care, counseling/discussion 11/30/2020  ? Dysphagia 08/23/2020  ? Psoriasis 04/23/2020  ? Normal anion gap metabolic acidosis   ? Atrial fibrillation ([place]) 10/14/2018  ? Type 2 diabetes mellitus ([place]) 09/04/2017  ? 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 Ischemic pain of foot, right   ? Severe peripheral arterial disease ([place]) 02/27/2017  ? Benign prostatic hyperplasia with urinary obstruction   ? Orthostatic hypotension 04/04/2016  ? Urinary retention 03/20/2016  ? Cerebrovascular accident (CVA) due to occlusion of left posterior cerebral artery ([place])   ? Constipation 11/07/2014  ? Essential hypertension   ? Normocytic anemia 10/20/2014  ? Hyporeninemic hypoaldosteronism ([place]) 08/24/2014  ? Hyperkalemia 08/01/2014  ? Healthcare maintenance 07/13/2014  ? Atherosclerosis of leg with intermittent claudication, R 06/29/2014  ? Current smoker 06/27/2014  ? CKD (chronic kidney disease), stage V ([place]) 06/26/2014  ? ? ?Palliative Care Assessment & Plan  ? ?Patient Profile: ?81 y.o. male  with past medical history of CKD stage V with recurrent hyperkalemia, hypertension, hyperlipidemia, CVA with residual left-sided weakness and left-sided hemiballismus, A-fib on Eliquis, peripheral vascular disease and type 2 diabetes  admitted on 03/26/2022 with hyperkalemia and sepsis of unclear etiology. ? ?Assessment: ?Principal Problem: ?  Hyperkalemia ?Active Problems: ?  Urinary tract infection, E. coli ?  Sepsis ([place]) ?   Terminal care ? ?Recommendations/Plan: ?Continue comfort care with medications as needed per [facility] ?Patient determined not to be eligible for [facility] for residential hospice, patient/family agreeable to pursuing SNF with hospice ?Discontinued additional orders that were not focused on comfort ?PMT will continue to follow and support holistically ? ? ?Prognosis: ?Poor in the setting of advanced age, end stage CKD not  wanting HD, and multiple comorbidities ? ?Discharge Planning: ?[place] with Hospice ? ?Care plan was discussed with patient, patient's niece, TOC, Dr. Johnnye Sima, hospice liaison via secure chat ? ? ?Total time: ?I spent 68 minutes in the care of the patient today in the above activities and documenting the encounter. ? ? ?Dorthy Cooler, PA-C ?Palliative Medicine Team ?Team phone # [phone] ? ?Thank you for allowing the Palliative Medicine Team to assist in the care of this patient. Please utilize secure chat with additional questions, if there is no response within 30 minutes please call the above phone number. ? ?Palliative Medicine Team providers are available by phone from 7am to 7pm daily and can be reached through the team cell phone.  ?Should this patient require assistance outside of these hours, please call the patient's attending physician.  ?

## 2022-04-01 NOTE — Progress Notes (Signed)
Patient ZO:XWRUEAV Charma Igo.      DOB: 11-Dec-1941      WUJ:[id number] ? ? ? ?  ?Palliative Medicine Team ? ? ? ?Subjective: Bedside symptom check completed. No family or other visitors bedside at this time.  ? ? ?Physical exam: Patient alert in bed, pleasantly conversational at time of visit. Breathing even and non-labored, no excessive secretions noted. Patient without physical or non-verbal signs of pain or discomfort at this time. Patient's extremities warm and dry to touch, strong pulses noted.  ? ? ?Assessment and plan: Patient denies any pain or discomfort. HE is content that he finally "warmed up" and enjoys having many blankets on his bed. He has no needs or concerns at this time. Bedside RN endorses that he has not had any PRN medication needs on this shift, but does ask if PMT can look at his [facility], many medications remain after the patient has moved to comfort care. Will address with Dorthy Cooler, PA to adjust if appropriate. This morning patient remains stable and appropriate for residential hospice transfer if he is accepted. Will continue to follow for any changes or advances.  ? ? ?Thank you for allowing the Palliative Medicine Team to assist in the care of this patient. ?  ?  ?Damian Leavell, MSN, RN ?Palliative Medicine Team ?Team Phone: [phone]  ?This phone is monitored 7a-7p, please reach out to attending physician outside of these hours for urgent needs.   ?

## 2022-04-01 NOTE — Progress Notes (Signed)
[facility] Hospital Liaison Note ? ?Chart reviewed by [facility] physician and unfortunately patient is not appropriate for [facility] at this time.  ? ?Please call with any concerns or questions. Thank you ? ?Roselee Nova, LCSW ?[facility] Liaison ?[phone]  ?

## 2022-04-01 NOTE — Progress Notes (Signed)
? ?Subjective: ? ?Derrick Hendrix. Patient states he is doing well today. Endorses some tooth pain with eating hard foods, has been able to eat softer foods like apple sauce without issues. Informed patient of pain prns on board. States no issues with urinating or stooling. No abdominal pain. ? ?Patient reiterates want for comfort care and to rest. He is no longer shivering or cold and states he is "finally warmed up," and getting good rest. No further concerns this morning. ? ?Objective: ? ?Vital signs in last 24 hours: ?Vitals:  ? 03/31/22 1000 03/31/22 2019 04/01/22 1030 04/01/22 1031  ?BP: (!) 179/83 (!) 143/79 (!) 165/84 (!) 165/84  ?Pulse:  (!) 103 70 70  ?Resp:  18 18   ?Temp:  98.1 ?F (36.7 ?C)    ?TempSrc:  Oral    ?SpO2:  96% 96% 96%  ?Weight:      ?Height:      ? ?Weight change:  ? ?Intake/Output Summary (Last 24 hours) at 04/01/2022 1105 ?Last data filed at 04/01/2022 0800 ?Gross per 24 hour  ?Intake 780 ml  ?Output 1100 ml  ?Net -320 ml  ? ?Physical Exam: ?Constitutional: Thin chronically appearing male laying in bed, drowsy but responsive to questioning, NAD ?HEENT: atraumatic, normocephalic, poor dentition ?Cardio: normal rate and rhythm, no murmurs or rubs ?Pulm: exam limited by patient position, CTAB, normal work of breathing on room air, no wheezing ?Abd: soft, nondistended, nontender, +bowel sounds ?Extremities: moving all extremities spontaneously, no edema, normal skin turgor ?Neuro: alert and oriented x3, answering questions appropriately ?Psych: normal mood, lethargic affect ? ?Assessment/Plan: ? ?Principal Problem: ?  Hyperkalemia ?Active Problems: ?  Urinary tract infection, E. coli ?  Sepsis ([place]) ? ?Derrick Hendrix. is a 81 year old male with a past medical history significant for nonoliguric CKD stage V with recurrent hyperkalemia, hypertension, hyperlipidemia, CVA with residual L sided weakness and L sided hemiballismus, Afib on eliquis, peripheral vascular disease status post right SFA  intervention in 2018, type 2 diabetes complicated by peripheral neuropathy who presented for hyperkalemia complicated by sepsis secondary to UTI, now improving, transitioned to comfort care 3/17. ?  ?#Sepsis 2/2 UTI 2/2 E. coli, resolving  ?Possible left mandible dental abscess ?Sepsis likely secondary to UTI, now resolving. Patient was transitioned to comfort care on 4/17, palliative following. Labs previously showed improving leukocytosis, he remains afebrile and hemodynamically stable today. He continues to deny urinary symptoms and is no longer endorsing feeling cold or shivering. Patient with some dental pain today with chewing hard foods. Will transition him from regular diet to mechanical soft diet today to aid with tooth pain. Appropriate prn pain regimen on board as below. Patient was transitioned to oral antibiotics this morning; he will continue on Augmentin as below for total of 14 days treatment for UTI and possible dental abscess. We continue to follow clinically, but will discontinue all further lab monitoring per comfort care requests. Blood cultures currently with no growth x5 days. ID following, appreciate recs. Placement will be patient largest barrier to discharge, we are working with [facility] for appropriate placement, see below. ?-d/c IV ceftriaxone 2g daily ?-start Augmentin 500-'125mg'$  BID x total 14 days (on day 7/14) ?-d/c regular diet, start dysphagia 3: mechanical soft diet ?-prn Tylenol, Dilaudid 0.5-'1mg'$  for severe pain ?-ID consulted, appreciated recs ?-f/u Blood cultures: NGTD x 5 days ?-f/u urine cultures: >100,000 colonies/mL E. coli. ?  ?#CKD stage V (non oliguric) ?Patient chronically with BUN in 110s and creatinine of 5.34, currently asymptomatic. Per  patient's niece, previously followed with nephrology and was evaluated by vascular surgery; left upper extremity found not to be suitable for AV fistula and patient did not want to entertain other sites. Nephrology signed off, patient and  family not amenable to further interventions. He continues to endorse decreased po due to poor appetite likely 2/2 to CKD and tooth pain.  Palliative care consulted and currently following, he was transitioned to comfort care 4/17. We will no longer follow progression with labs, medication regimen will be narrowed, and all interventions will be aimed at providing comfort. Patient found to not be appropriate for hospice care at [facility]. Working with [facility] for appropriate placement. ?-Avoid nephrotoxic agents ?-Palliative consulted, appreciate recommendations ?  ?#Paroxysmal atrial fibrillation ?Patient remains in NSR. Previously on anticoagulation with Eliquis for atrial fibrillation. Patient now comfort care, will discontinue Eliquis today. ?-d/c Eliquis 2.'5mg'$  BID ?  ?#Hypertension ?Patient previously hypotensive likely in the setting of sepsis. Today BP elevated, he is now hypertensive to 165/84. Patient now comfort care, will discontinue all antihypertensives. ?-d/c amlodipine '10mg'$  daily ?-continue to hold home hydralazine  ?  ?#Chronic anemia, macrocytic ?Recent iron studies 3/31 are remarkable for iron deficiency.  Most likely related to chronic kidney disease. Vitamin B12 low-normal and folate wnl. Hgb previously stable at 9-10. Patient now comfort care, will not CTM with labs. ? ?#Hyperlipidemia ?Patient now comfort care, will discontinue statin. ?-d/c home atorvastatin ?  ?#T2DM c/b peripheral neuropathy ?Patient with history of diabetes however most recent A1c within normal limits, not currently on antidiabetic medications. Patient previously running low likely secondary to poor po intake as above. Patient now comfort care, will no longer monitor blood glucose with labs. On gabapentin 100 mg nightly for his neuropathic pain.  ?--continue gabapentin '100mg'$  qhs ?  ?#PAD s/p R SFA nitinol stent in 2018 ?Most recent ABIs were indeterminant due to patient motion.  No concern for acute peripheral vascular  disease. Continue medical management per above ?  ?#Hx CVA w/ residual L sided weakness ?Patient reports some residual left-sided weakness as well as some left-sided hemiballismus.  On exam his left upper extremity and left lower extremity strength were 5 out of 5.  Patient is currently not on a antiplatelet agent  or statin as above. ?  ?Prior to Admission Living Arrangement: Home ?Anticipated Discharge Location: Pending; Palliative on board ?Barriers to Discharge: Clinical improvement ?Dispo: Anticipated discharge in approximately 3-5 day(s).  ? ? LOS: 5 days  ? ?Rodriguez-Teodoro, Quintella Reichert, Medical Student ?04/01/2022, 11:05 AM  ?

## 2022-04-02 DIAGNOSIS — Z7401 Bed confinement status: Secondary | ICD-10-CM | POA: Diagnosis not present

## 2022-04-02 DIAGNOSIS — E875 Hyperkalemia: Secondary | ICD-10-CM | POA: Diagnosis not present

## 2022-04-02 DIAGNOSIS — Z743 Need for continuous supervision: Secondary | ICD-10-CM | POA: Diagnosis not present

## 2022-04-02 DIAGNOSIS — R531 Weakness: Secondary | ICD-10-CM | POA: Diagnosis not present

## 2022-04-02 MED ORDER — LORAZEPAM 1 MG PO TABS: 1.0000 mg | ORAL_TABLET | ORAL | 0 refills | Status: DC | PRN

## 2022-04-02 MED ORDER — ACETAMINOPHEN 325 MG PO TABS: 650.0000 mg | ORAL_TABLET | Freq: Four times a day (QID) | ORAL | 0 refills | Status: AC | PRN

## 2022-04-02 MED ORDER — DIPHENHYDRAMINE HCL 50 MG/ML IJ SOLN
12.5000 mg | INTRAMUSCULAR | 0 refills | Status: DC | PRN
Start: 2022-04-02 — End: 2023-04-05

## 2022-04-02 MED ORDER — POLYVINYL ALCOHOL 1.4 % OP SOLN: 1.0000 [drp] | Freq: Four times a day (QID) | OPHTHALMIC | 0 refills | Status: DC | PRN

## 2022-04-02 MED ORDER — BIOTENE DRY MOUTH MT LIQD: 15.0000 mL | Freq: Two times a day (BID) | OROMUCOSAL | Status: AC

## 2022-04-02 MED ORDER — HYDROMORPHONE HCL 1 MG/ML IJ SOLN: 0.5000 mg | INTRAMUSCULAR | 0 refills | Status: DC | PRN

## 2022-04-02 MED ORDER — MELATONIN 5 MG PO TABS: 5.0000 mg | ORAL_TABLET | Freq: Every day | ORAL | 0 refills | Status: DC

## 2022-04-02 MED ORDER — AMOXICILLIN-POT CLAVULANATE 500-125 MG PO TABS: 500.0000 mg | ORAL_TABLET | Freq: Two times a day (BID) | ORAL | 0 refills | Status: AC

## 2022-04-02 MED ORDER — CAPSAICIN 0.025 % EX CREA: TOPICAL_CREAM | Freq: Two times a day (BID) | CUTANEOUS | 0 refills | Status: DC

## 2022-04-02 MED ORDER — ONDANSETRON 4 MG PO TBDP: 4.0000 mg | ORAL_TABLET | Freq: Four times a day (QID) | ORAL | 0 refills | Status: DC | PRN

## 2022-04-02 MED ORDER — CAMPHOR-MENTHOL 0.5-0.5 % EX LOTN: TOPICAL_LOTION | CUTANEOUS | 0 refills | Status: AC | PRN

## 2022-04-02 MED ORDER — HALOPERIDOL 2 MG PO TABS
2.0000 mg | ORAL_TABLET | Freq: Four times a day (QID) | ORAL | Status: AC | PRN
Start: 2022-04-02 — End: ?

## 2022-04-02 MED ORDER — GLYCOPYRROLATE 1 MG PO TABS: 1.0000 mg | ORAL_TABLET | ORAL | Status: DC | PRN

## 2022-04-02 MED ORDER — CAPSAICIN 0.025 % EX CREA
TOPICAL_CREAM | Freq: Two times a day (BID) | CUTANEOUS | Status: DC
  Filled 2022-04-02: qty 60

## 2022-04-02 NOTE — Progress Notes (Signed)
? ?  Subjective: NAEON ? ?Patient reports feeling well this morning.  He endorses some right foot pain and frustration with receiving delayed meals yesterday, but no acute concerns or complaints.  He has been eating well, is without abdominal or chest pain, and voiding well. ? ?Objective: ? ?Vital signs in last 24 hours: ?Vitals:  ? 04/01/22 1030 04/01/22 1031 04/01/22 2036 04/02/22 0450  ?BP: (!) 165/84 (!) 165/84 (!) 154/77 (!) 164/79  ?Pulse: 70 70 79 75  ?Resp: '18  16 18  '$ ?Temp:   97.9 ?F (36.6 ?C) 98.5 ?F (36.9 ?C)  ?TempSrc:   Oral   ?SpO2: 96% 96% 97% 96%  ?Weight:      ?Height:      ? ?Constitutional: Thin, chronically-ill appearing, elderly male lying comfortably in bed, lethargic but appropriately responsive to questioning. ?HEENT: normocephalic, atraumatic, moist mucus membranes, patient with poor dentition but no purulence, bleeding, or signs of abscess. ?Cardio: Normal rate and rhythm, no murmurs, rubs, gallops. ?Pulm: CTAB anteriorly, No increased work of breathing on room air with no wheezes or crackles ?Abd: soft, nontender, nondistended, +normal bowel sounds ?Skin: warm and dry; normal skin turgor. ?MSK: Right foot with significant tenderness to palpation of the distal abductor hallucis muscle ?Neuro: alert and oriented x 3, appropriately responding to questions. ?Psych: Normal mood. ? ?Assessment/Plan: ?[name], Derrick Hendrix. is a 81 year old male with a past medical history significant for nonoliguric CKD stage V with recurrent hyperkalemia, hypertension, hyperlipidemia, CVA with residual L sided weakness and L sided hemiballismus, Afib on eliquis, peripheral vascular disease status post right SFA intervention in 2018, type 2 diabetes complicated by peripheral neuropathy who presented for hyperkalemia, found to have sepsis likely multifactorial secondary to UTI and possible periodontal abscess, now with comfort care measures in place. ? ?Principal Problem: ?  Hyperkalemia ?Active Problems: ?   Urinary tract infection, E. coli ?  Sepsis ([place]) ? ?#Sepsis 2/2 UTI 2/2 E. coli, improving  ?#Possible mandibular abscess ?Sepsis likely multifactorial secondary to E. Coli UTI and possible mandibular abscess. Urine culture: >100,000 colonies/mL E. coli.  Orthopantogram with.  Per [place] premolar lucency of left mandible, possible abscess versus granulation tissue. Palliative care on board, and patient now with comfort measures in place, continuing only medications that will provide relief of symptoms.  ?-Augmentin 500-125 mg twice daily for total of 14 days.  EOT 4/26 ?-Comfort medications per palliative recs ?  ?#CKD stage V (non oliguric) ?Given end stage renal disease, patient is candidate for hospice care. Palliative care following with plans for hospice home vs. SNF with hospice. ? ?#T2DM c/b peripheral neuropathy ?Patient with history of diabetes however most recent A1c within normal limits. On gabapentin 100 mg nightly for his neuropathic pain.  ?-- Continue gabapentin '100mg'$  qhs ? ? ?#Hx CVA w/ residual L sided weakness ?Patient reports some residual left-sided weakness as well as some left-sided hemiballismus. ? ?#BPH ?- Continue home finasteride 5 mg and Flomax 0.8 mg daily ?  ?Chronic conditions for which patient is no longer receiving treatment/no further intervention: ?#Hypertension ?#Paroxysmal atrial fibrillation ?#Chronic anemia, macrocytic ?#Hyperlipidemia ? ?Prior to Admission Living Arrangement: ?Anticipated Discharge Location: ?Barriers to Discharge: ?Dispo: Anticipated discharge in approximately 2-5 day(s).  ? Rosezetta Schlatter, MD ?04/02/2022, 7:48 AM ?Pager: [phone] ?After 5pm on weekdays and 1pm on weekends: On Call pager [phone]  ?

## 2022-04-02 NOTE — Progress Notes (Signed)
Attempted to call [facility] to give report. Two attempts made, was sent to voicemail. Voicemail left for nurse's station to return call.  ?

## 2022-04-02 NOTE — Discharge Summary (Addendum)
? ?Name: Derrick Hendrix. ?MRN: [id number] ?DOB: [birth year] 81 y.o. ?PCP: [name], MD ? ?Date of Admission: 03/26/2022  5:23 PM ?Date of Discharge: No discharge date for patient encounter. ?Attending Physician: Campbell Riches, MD ? ?Discharge Diagnosis: ?1.  Sepsis secondary to E. coli UTI ?2.  Possible mandibular abscess ?3.  CKD stage 5 ?4. T2DM ?5. 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 ?Discharge Medications: ?Allergies as of 04/02/2022   ? ?   Reactions  ? Aspirin Other (See Comments)  ? Nosebleeds   ? ?  ? ?  ?Medication List  ?  ? ?STOP taking these medications   ? ?amLODipine 10 MG tablet ?Commonly known as: NORVASC ?  ?atorvastatin 80 MG tablet ?Commonly known as: LIPITOR ?  ?Blood Pressure Kit Devi ?  ?clobetasol cream 0.05 % ?Commonly known as: TEMOVATE ?  ?docusate sodium 100 MG capsule ?Commonly known as: Colace ?  ?Eliquis 2.5 MG Tabs tablet ?Generic drug: apixaban ?  ?furosemide 40 MG tablet ?Commonly known as: LASIX ?  ?gabapentin 100 MG capsule ?Commonly known as: Neurontin ?  ?hydrALAZINE 10 MG tablet ?Commonly known as: APRESOLINE ?  ?nitroGLYCERIN 0.4 MG SL tablet ?Commonly known as: NITROSTAT ?  ?omeprazole 40 MG capsule ?Commonly known as: PRILOSEC ?  ?sodium bicarbonate 650 MG tablet ?  ?Veltassa 8.4 g packet ?Generic drug: patiromer ?  ? ?  ? ?TAKE these medications   ? ?acetaminophen 325 MG tablet ?Commonly known as: TYLENOL ?Take 2 tablets (650 mg total) by mouth every 6 (six) hours as needed for mild pain (or Fever >/= 101). ?  ?amoxicillin-clavulanate 500-125 MG tablet ?Commonly known as: AUGMENTIN ?Take 1 tablet (500 mg total) by mouth 2 (two) times daily for 6 days. ?Start taking on: April 03, 2022 ?  ?antiseptic oral rinse Liqd ?Apply 15 mLs topically 2 (two) times daily. ?  ?camphor-menthol lotion ?Commonly known as: SARNA ?Apply topically as needed for itching. ?  ?capsaicin 0.025 % cream ?Commonly known as: ZOSTRIX ?Apply topically 2 (two)  times daily. ?  ?diphenhydrAMINE 50 MG/ML injection ?Commonly known as: BENADRYL ?Inject 0.25 mLs (12.5 mg total) into the vein every 4 (four) hours as needed for itching. ?  ?finasteride 5 MG tablet ?Commonly known as: PROSCAR ?TAKE ONE TABLET BY MOUTH DAILY ?  ?glycopyrrolate 1 MG tablet ?Commonly known as: ROBINUL ?Take 1 tablet (1 mg total) by mouth every 4 (four) hours as needed (excessive secretions). ?  ?haloperidol 2 MG tablet ?Commonly known as: HALDOL ?Take 1 tablet (2 mg total) by mouth every 6 (six) hours as needed for agitation (or delirium). ?  ?HYDROmorphone 1 MG/ML injection ?Commonly known as: DILAUDID ?Inject 0.5-1 mLs (0.5-1 mg total) into the vein every 2 (two) hours as needed for severe pain or moderate pain (dyspnea, increased work of breathing, RR >25, distress). ?  ?LORazepam 1 MG tablet ?Commonly known as: ATIVAN ?Take 1 tablet (1 mg total) by mouth every hour as needed for anxiety, seizure or sleep (distress). ?  ?melatonin 5 MG Tabs ?Take 1 tablet (5 mg total) by mouth at bedtime. ?  ?ondansetron 4 MG disintegrating tablet ?Commonly known as: ZOFRAN-ODT ?Take 1 tablet (4 mg total) by mouth every 6 (six) hours as needed for nausea. ?  ?polyvinyl alcohol 1.4 % ophthalmic solution ?Commonly known as: LIQUIFILM TEARS ?Place 1 drop into both eyes 4 (four) times daily as needed for dry eyes. ?  ?tamsulosin 0.4 MG Caps capsule ?Commonly known  asCaprice Kluver ?TAKE TWO CAPSULES BY MOUTH DAILY ?  ? ?  ? ? ?Disposition and follow-up:   ?[name]. was discharged from [facility] in [place] condition.  At the hospital follow up visit please address: ? ?1.  Patient admitted for E. coli UTI and sepsis.  Treating with 14-day course of antibiotics last day scheduled for 04/09/2022.  After long discussion with POA niece and patient ultimately decided to pursue hospice at [facility]. ? ?2.  Labs / imaging needed at time of follow-up: None ? ?3.  Pending labs/ test needing follow-up:  None ? ?Follow-up Appointments: ? ? ?Hospital Course by problem list: ?Sepsis secondary to E. coli UTI ?Patient presents with fevers, chills, malaise.  Initially admitted for hyperkalemia secondary to missing his Veltassa.  In the ED found to be febrile to 103 with tachycardia and tachypnea.  Patient with leukocytosis and lactic acidosis and was admitted for sepsis.  Infectious work-up revealed urine culture growing ampicillin resistant E. coli.  Blood cultures were negative.  During admission noted that he had increasing white blood cell count up to 36.  Extensive work-up for other source of infection including chest x-ray, MRI of the right foot, ultrasound and CT abdomen with multiple left renal cysts but no renal abscess largely unrevealing.  Orthopantogram with possible lucency of a premolar on the left suggesting possible abscess he was started on IV metronidazole.  He was started on IV cefepime in the ED.  Slowly had improvement during his admission of leukocytosis to 20 and was transitioned to IV Rocephin.   Patient remained afebrile during admission. Prior to being made comfort care.  Antibiotics transition to p.o. Augmentin with plan to have 14-day course last day of antibiotics 04/09/2022.   ? ?CKD stage V (nonoliguric) ?Patient with a history of stage V CKD with recurrent hyperkalemia and previous mL test at home prior to admission.  Patient presented to the internal medicine clinic and had not been taking his Veltassa at home.  Noted that potassium was 6.7 on admission to the ED.  Was treated with [facility] with improvement of his potassium.  Nephrology was consulted for possible initiation of dialysis given recurrent hyperkalemia and elevated BUN's to 100s.  Patient previously at outpatient clinic had declined HD after being evaluated for vascular access.  Patient after consultation with nephrology reaffirmed desire to avoid dialysis.  Palliative was subsequently consulted and power of attorney niece Derrick Hendrix also involved in discussions.  Ultimately decided to transition patient to comfort care.  Patient decided to complete antibiotic course but otherwise transition medications to comfort.  Patient agreeable with this. Patient made DNR and medications transitioned to more comfort focus. Family would like for hims to complete antibiotic course for E. Coli UTI.  Plan to send patient to [facility] due to lack of p.o. intake and worsening sepsis from E. coli UTI.  However patient improving last few days and is eating and mentating well on day of discharge.  Plan to discharge to [facility] with hospice. ? ?Discharge Exam:   ?BP (!) 160/74   Pulse 74   Temp 98.4 ?F (36.9 ?C) (Oral)   Resp 15   Ht _0  (1.88 m)   Wt 77 kg   SpO2 97%   BMI 21.80 kg/m?  ?Discharge exam:  ?Constitutional: Thin, chronically-ill appearing, elderly male lying comfortably in bed, lethargic but appropriately responsive to questioning. ?HEENT: normocephalic, atraumatic, moist mucus membranes, patient with poor dentition but no purulence,  bleeding, or signs of abscess. ?Cardio: Normal rate and rhythm, no murmurs, rubs, gallops. ?Pulm: CTAB anteriorly, No increased work of breathing on room air with no wheezes or crackles ?Abd: soft, nontender, nondistended, +normal bowel sounds ?Skin: warm and dry; normal skin turgor. ?MSK: Right foot with significant tenderness to palpation of the distal abductor hallucis muscle ?Neuro: alert and oriented x 3, appropriately responding to questions. ?Psych: Normal mood. ? ?Pertinent Labs, Studies, and Procedures:  ? ?  Latest Ref Rng & Units 03/31/2022  ?  5:01 AM 03/30/2022  ?  6:42 AM 03/29/2022  ?  5:35 AM  ?CBC  ?WBC 4.0 - 10.5 K/uL 20.9   36.7   33.9    ?Hemoglobin 13.0 - 17.0 g/dL 8.8   9.0   9.1    ?Hematocrit 39.0 - 52.0 % 27.3   28.0   28.0    ?Platelets 150 - 400 K/uL 160   159   152    ? ? ?  Latest Ref Rng & Units 03/31/2022  ?  5:01 AM 03/30/2022  ?  6:42 AM 03/29/2022  ?  5:35 AM  ?BMP   ?Glucose 70 - 99 mg/dL 108   66   52    ?BUN 8 - 23 mg/dL 106   115   113    ?Creatinine 0.61 - 1.24 mg/dL 5.34   6.32   6.79    ?Sodium 135 - 145 mmol/L 139   140   140    ?Potassium 3.5 - 5.1 mmol/L 4.1   4.3   4.2    ?Chl

## 2022-04-02 NOTE — NC FL2 (Signed)
?[place] MEDICAID FL2 LEVEL OF CARE SCREENING TOOL  ?  ? ?IDENTIFICATION  ?Patient Name: ?Derrick Hendrix. Birthdate: 1941-02-01 Sex: male Admission Date (Current Location): ?03/26/2022  ?[place] and [place] Number: ? [place] ?[id number] R Facility and Address:  ?[facility], [place] [address], [place], [place] [address] ?     Provider Number: ?[id number]  ?Attending Physician Name and Address:  ?Campbell Riches, MD ? Relative Name and Phone Number:  ?Reymond, Maynez (Niece)   [phone] ?   ?Current Level of Care: ?Hospital Recommended Level of Care: ?[facility] Prior Approval Number: ?  ? ?Date Approved/Denied: ?  PASRR Number: ?[id number] A ? ?Discharge Plan: ?SNF ?  ? ?Current Diagnoses: ?Patient Active Problem List  ? Diagnosis Date Noted  ? Urinary tract infection, E. coli 03/28/2022  ? Sepsis ([place]) 03/28/2022  ? Peripheral neuropathy 03/02/2022  ? Goals of care, counseling/discussion 11/30/2020  ? Dysphagia 08/23/2020  ? Psoriasis 04/23/2020  ? Normal anion gap metabolic acidosis   ? Atrial fibrillation ([place]) 10/14/2018  ? Type 2 diabetes mellitus ([place]) 09/04/2017  ? 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 Ischemic pain of foot, right   ? Severe peripheral arterial disease ([employer]) 02/27/2017  ? Benign prostatic hyperplasia with urinary obstruction   ? Orthostatic hypotension 04/04/2016  ? Urinary retention 03/20/2016  ? Cerebrovascular accident (CVA) due to occlusion of left posterior cerebral artery ([place])   ? Constipation 11/07/2014  ? Essential hypertension   ? Normocytic anemia 10/20/2014  ? 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 Hyperkalemia 08/01/2014  ? Healthcare maintenance 07/13/2014  ? Atherosclerosis of leg with intermittent claudication, R 06/29/2014  ? Current smoker 06/27/2014  ? CKD (chronic kidney disease), stage V ([place]) 06/26/2014  ? ? ?Orientation RESPIRATION BLADDER Height & Weight   ?  ?Self, Time, Place ? Normal Continent Weight: 169 lb 12.4 oz (77  kg) ?Height:  '6\' 2"'$  (188 cm)  ?BEHAVIORAL SYMPTOMS/MOOD NEUROLOGICAL BOWEL NUTRITION STATUS  ?    Continent Diet (see d/c summary)  ?AMBULATORY STATUS COMMUNICATION OF NEEDS Skin   ?Extensive Assist Verbally Normal ?  ?  ?  ?    ?     ?     ? ? ?Personal Care Assistance Level of Assistance  ?Bathing, Feeding, Dressing Bathing Assistance: Maximum assistance ?Feeding assistance: Independent ?Dressing Assistance: Maximum assistance ?   ? ?Functional Limitations Info  ?Sight, Hearing, Speech Sight Info: Impaired ?Hearing Info: Adequate ?Speech Info: Adequate  ? ? ?SPECIAL CARE FACTORS FREQUENCY  ?    ?  ?  ?  ?  ?  ?  ?   ? ? ?Contractures Contractures Info: Not present  ? ? ?Additional Factors Info  ?Code Status, Allergies Code Status Info: DNR ?Allergies Info: Aspirin ?  ?  ?  ?   ? ?Current Medications (04/02/2022):  This is the current hospital active medication list ?Current Facility-Administered Medications  ?Medication Dose Route Frequency Provider Last Rate Last Admin  ? acetaminophen (TYLENOL) tablet 650 mg  650 mg Oral Q6H PRN Rick Duff, MD   650 mg at 03/31/22 2159  ? Or  ? acetaminophen (TYLENOL) suppository 650 mg  650 mg Rectal Q6H PRN Rick Duff, MD      ? amoxicillin-clavulanate (AUGMENTIN) 500-125 MG per tablet 500 mg  500 mg Oral BID Franky Macho, RPH   500 mg at 04/02/22 7893  ? antiseptic oral rinse (BIOTENE) solution 15 mL  15 mL Topical BID Lin Landsman, NP   15  mL at 04/01/22 2137  ? camphor-menthol (SARNA) lotion   Topical PRN Rosezetta Schlatter, MD      ? capsaicin (ZOSTRIX) 0.025 % cream   Topical BID Rosezetta Schlatter, MD      ? diphenhydrAMINE (BENADRYL) injection 12.5 mg  12.5 mg Intravenous Q4H PRN Lin Landsman, NP      ? finasteride (PROSCAR) tablet 5 mg  5 mg Oral Daily Rick Duff, MD   5 mg at 04/02/22 0948  ? gabapentin (NEURONTIN) capsule 100 mg  100 mg Oral QHS Orvis Brill, MD   100 mg at 04/01/22 2136  ? glycopyrrolate (ROBINUL) tablet 1 mg  1 mg Oral  Q4H PRN Lin Landsman, NP      ? Or  ? glycopyrrolate (ROBINUL) injection 0.2 mg  0.2 mg Subcutaneous Q4H PRN Lin Landsman, NP      ? Or  ? glycopyrrolate (ROBINUL) injection 0.2 mg  0.2 mg Intravenous Q4H PRN Lin Landsman, NP      ? haloperidol (HALDOL) tablet 2 mg  2 mg Oral Q6H PRN Lin Landsman, NP      ? Or  ? haloperidol (HALDOL) 2 MG/ML solution 2 mg  2 mg Sublingual Q6H PRN Lin Landsman, NP      ? Or  ? haloperidol lactate (HALDOL) injection 2 mg  2 mg Intravenous Q6H PRN Lin Landsman, NP      ? HYDROmorphone (DILAUDID) injection 0.5-1 mg  0.5-1 mg Intravenous Q2H PRN Lin Landsman, NP      ? LORazepam (ATIVAN) tablet 1 mg  1 mg Oral Q1H PRN Lin Landsman, NP      ? Or  ? LORazepam (ATIVAN) 2 MG/ML concentrated solution 1 mg  1 mg Sublingual Q1H PRN Lin Landsman, NP      ? Or  ? LORazepam (ATIVAN) injection 1 mg  1 mg Intravenous Q1H PRN Lin Landsman, NP      ? melatonin tablet 5 mg  5 mg Oral QHS Cooper, Josseline P, PA-C   5 mg at 04/01/22 2136  ? ondansetron (ZOFRAN-ODT) disintegrating tablet 4 mg  4 mg Oral Q6H PRN Lin Landsman, NP      ? Or  ? ondansetron (ZOFRAN) injection 4 mg  4 mg Intravenous Q6H PRN Lin Landsman, NP      ? polyvinyl alcohol (LIQUIFILM TEARS) 1.4 % ophthalmic solution 1 drop  1 drop Both Eyes QID PRN Lin Landsman, NP      ? tamsulosin (FLOMAX) capsule 0.8 mg  0.8 mg Oral Daily Rick Duff, MD   0.8 mg at 04/02/22 0948  ? white petrolatum (VASELINE) gel   Topical PRN Rosezetta Schlatter, MD   Given at 04/01/22 1607  ? ? ? ?Discharge Medications: ?Please see discharge summary for a list of discharge medications. ? ?Relevant Imaging Results: ? ?Relevant Lab Results: ? ? ?Additional Information ?SSN:  [id number]   6'2"  169lbs ? ?Paulene Floor Aila Terra, LCSWA ? ? ? ? ?

## 2022-04-02 NOTE — Progress Notes (Signed)
Patient [id number] Derrick Hendrix.      DOB: 12-Sep-1941      [id number] ? ? ? ?  ?Palliative Medicine Team ? ? ? ?Subjective: Bedside symptom check completed. No family or visitors present at this time. ? ? ?Physical exam: Patient resting in bed with eyes closed at time of visit. Breathing even and non-labored, no excessive secretions noted. Patient without physical or non-verbal signs of pain or discomfort at this time. This RN did not attempt to wake to ensure continued comfort and promote rest.  ? ? ?Assessment and plan: Staff without any concerns at this time. Will continue to follow for any changes or advances.  ? ? ?Thank you for allowing the Palliative Medicine Team to assist in the care of this patient. ?  ?  ?Damian Leavell, MSN, RN ?Palliative Medicine Team ?Team Phone: [phone]  ?This phone is monitored 7a-7p, please reach out to attending physician outside of these hours for urgent needs.   ?

## 2022-04-02 NOTE — TOC Progression Note (Signed)
Transition of Care (TOC) - Initial/Assessment Note  ? ? ?Patient Details  ?Name: Derrick Hendrix. ?MRN: [id number] ?Date of Birth: [birth year] ? ?Transition of Care (TOC) CM/SW Contact:    ?Paulene Floor Lutie Pickler, LCSWA ?Phone Number: ?04/02/2022, 3:25 PM ? ?Clinical Narrative:                 ?CSW presented bed offers to patient's niece.  Family is agreeable with the patient going to [facility].  The facility can accept today.  MD notified. ? ?Expected Discharge Plan: [place] ?Barriers to Discharge: Hospice Bed not available ? ? ?Patient Goals and CMS Choice ?Patient states their goals for this hospitalization and ongoing recovery are:: To go to [facility] ?CMS Medicare.gov Compare Post Acute Care list provided to:: Patient ?Choice offered to / list presented to : Patient, Adult Children (Niece) ? ?Expected Discharge Plan and Services ?Expected Discharge Plan: [place] ?  ?Discharge Planning Services: CM Consult ?  ?Living arrangements for the past 2 months: [place] ?                ?  ?  ?  ?  ?  ?HH Arranged: NA ?[place] Agency: Other - See comment ([facility], [place]) ?  ?  ?  ? ?Prior Living Arrangements/Services ?Living arrangements for the past 2 months: [place] ?Lives with:: Self ?Patient language and need for interpreter reviewed:: Yes ?Do you feel safe going back to the place where you live?: Yes      ?Need for Family Participation in Patient Care: Yes (Comment) ?Care giver support system in place?: Yes (comment) ?  ?Criminal Activity/Legal Involvement Pertinent to Current Situation/Hospitalization: No - Comment as needed ? ?Activities of Daily Living ?  ?ADL Screening (condition at time of admission) ?Patient's cognitive ability adequate to safely complete daily activities?: Yes ?Is the patient deaf or have difficulty hearing?: Yes ?Does the patient have difficulty seeing, even when wearing glasses/contacts?: No ?Does the patient have difficulty  concentrating, remembering, or making decisions?: No ?Patient able to express need for assistance with ADLs?: Yes ?Does the patient have difficulty dressing or bathing?: No ?Independently performs ADLs?: Yes (appropriate for developmental age) ?Does the patient have difficulty walking or climbing stairs?: Yes ?Weakness of Legs: Both ?Weakness of Arms/Hands: None ? ?Permission Sought/Granted ?Permission sought to share information with : Case Manager, Customer service manager, Family Supports ?Permission granted to share information with : Yes, Verbal Permission Granted ? Share Information with NAME: Fabio Pierce ? Permission granted to share info w AGENCY: [facility] ?   ?   ? ?Emotional Assessment ?Appearance:: Appears stated age ?Attitude/Demeanor/Rapport: Engaged, Gracious ?Affect (typically observed): Accepting, Appropriate, Calm, Hopeful ?Orientation: : Oriented to Self, Oriented to Place, Oriented to  Time, Oriented to Situation ?Alcohol / Substance Use: Not Applicable ?Psych Involvement: No (comment) ? ?Admission diagnosis:  Hyperkalemia [E87.5] ?Patient Active Problem List  ? Diagnosis Date Noted  ? Urinary tract infection, E. coli 03/28/2022  ? Sepsis ([place]) 03/28/2022  ? Peripheral neuropathy 03/02/2022  ? Goals of care, counseling/discussion 11/30/2020  ? Dysphagia 08/23/2020  ? Psoriasis 04/23/2020  ? Normal anion gap metabolic acidosis   ? Atrial fibrillation ([place]) 10/14/2018  ? Type 2 diabetes mellitus ([place]) 09/04/2017  ? 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 Ischemic pain of foot, right   ? Severe peripheral arterial disease ([place]) 02/27/2017  ? Benign prostatic hyperplasia with urinary obstruction   ? Orthostatic hypotension 04/04/2016  ? Urinary retention 03/20/2016  ? Cerebrovascular  accident (CVA) due to occlusion of left posterior cerebral artery ([place])   ? Constipation 11/07/2014  ? Essential hypertension   ? Normocytic anemia 10/20/2014  ? 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 Hyperkalemia  08/01/2014  ? Healthcare maintenance 07/13/2014  ? Atherosclerosis of leg with intermittent claudication, R 06/29/2014  ? Current smoker 06/27/2014  ? CKD (chronic kidney disease), stage V ([place]) 06/26/2014  ? ?PCP:  [name], MD ?Pharmacy:   ?[facility] [place] ?[address] ?[place] [address] ?Phone: [phone] Fax: [phone] ? ?[id number] [place] [facility] [address] ?[address] ?[id number]?Phone: [phone]: [phone]? ? ? ? ?Social Determinants of Health (SDOH) Interventions ?  ? ?Readmission Risk Interventions ?   ? View : No data to display.  ?  ?  ?  ? ? ? ?

## 2022-04-02 NOTE — Progress Notes (Signed)
Report called to [facility], spoke with [place]. Patient's belongings sent with [facility]. ?

## 2022-04-02 NOTE — TOC Transition Note (Signed)
Transition of Care (TOC) - CM/SW Discharge Note ? ? ?Patient Details  ?Name: Derrick Hendrix. ?MRN: [id number] ?Date of Birth: 08-30-1941 ? ?Transition of Care (TOC) CM/SW Contact:  ?Paulene Floor Kymberli Wiegand, LCSWA ?Phone Number: ?04/02/2022, 4:29 PM ? ? ?Clinical Narrative:    ?Patient will DC to: [facility] ?Anticipated DC date: 04/02/2022 ?Family notified: Yes  ?Transport by: Corey Harold ? ? ?Per MD patient ready for DC to SNF with hospice. RN to call report prior to discharge [phone]) [phone] room 125. RN, patient, patient's family, and facility notified of DC. Discharge Summary and FL2 sent to facility. DC packet on chart. Ambulance transport requested for patient.  ? ?CSW will sign off for now as social work intervention is no longer needed. Please consult [place] again if new needs arise. ?  ? ? ?Final next level of care: [place] ?Barriers to Discharge: Barriers Resolved ? ? ?Patient Goals and CMS Choice ?Patient states their goals for this hospitalization and ongoing recovery are:: To go to [facility] ?CMS Medicare.gov Compare Post Acute Care list provided to:: Patient ?Choice offered to / list presented to : Patient, Adult Children (Niece) ? ?Discharge Placement ?  ?           ?Patient chooses bed at:  ([place]) ?Patient to be transferred to facility by: [facility] ?Name of family member notified: Bogdan, Vivona (Niece)   [phone] ?Patient and family notified of of transfer: 04/02/22 ? ?Discharge Plan and Services ?  ?Discharge Planning Services: CM Consult ?           ?  ?  ?  ?  ?  ?HH Arranged: NA ?[place] Agency: Other - See comment ([facility], [place]) ?  ?  ?  ? ?Social Determinants of Health (SDOH) Interventions ?  ? ? ?Readmission Risk Interventions ?   ? View : No data to display.  ?  ?  ?  ? ? ? ? ? ?

## 2022-04-03 ENCOUNTER — Other Ambulatory Visit: Payer: Self-pay

## 2022-04-03 DIAGNOSIS — I739 Peripheral vascular disease, unspecified: Secondary | ICD-10-CM

## 2022-04-04 ENCOUNTER — Encounter (HOSPITAL_COMMUNITY): Payer: Medicare HMO

## 2022-04-04 DIAGNOSIS — E785 Hyperlipidemia, unspecified: Secondary | ICD-10-CM | POA: Diagnosis not present

## 2022-04-04 DIAGNOSIS — N185 Chronic kidney disease, stage 5: Secondary | ICD-10-CM | POA: Diagnosis not present

## 2022-04-04 DIAGNOSIS — I120 Hypertensive chronic kidney disease with stage 5 chronic kidney disease or end stage renal disease: Secondary | ICD-10-CM | POA: Diagnosis not present

## 2022-04-04 DIAGNOSIS — R531 Weakness: Secondary | ICD-10-CM | POA: Diagnosis not present

## 2022-04-04 DIAGNOSIS — E875 Hyperkalemia: Secondary | ICD-10-CM | POA: Diagnosis not present

## 2022-04-04 DIAGNOSIS — E1142 Type 2 diabetes mellitus with diabetic polyneuropathy: Secondary | ICD-10-CM | POA: Diagnosis not present

## 2022-04-04 DIAGNOSIS — I639 Cerebral infarction, unspecified: Secondary | ICD-10-CM | POA: Diagnosis not present

## 2022-04-04 DIAGNOSIS — I480 Paroxysmal atrial fibrillation: Secondary | ICD-10-CM | POA: Diagnosis not present

## 2022-04-04 DIAGNOSIS — I739 Peripheral vascular disease, unspecified: Secondary | ICD-10-CM | POA: Diagnosis not present

## 2022-04-04 DIAGNOSIS — E119 Type 2 diabetes mellitus without complications: Secondary | ICD-10-CM | POA: Diagnosis not present

## 2022-04-11 DIAGNOSIS — I639 Cerebral infarction, unspecified: Secondary | ICD-10-CM | POA: Diagnosis not present

## 2022-04-11 DIAGNOSIS — E785 Hyperlipidemia, unspecified: Secondary | ICD-10-CM | POA: Diagnosis not present

## 2022-04-11 DIAGNOSIS — E875 Hyperkalemia: Secondary | ICD-10-CM | POA: Diagnosis not present

## 2022-04-11 DIAGNOSIS — I739 Peripheral vascular disease, unspecified: Secondary | ICD-10-CM | POA: Diagnosis not present

## 2022-04-11 DIAGNOSIS — D539 Nutritional anemia, unspecified: Secondary | ICD-10-CM | POA: Diagnosis not present

## 2022-04-11 DIAGNOSIS — N185 Chronic kidney disease, stage 5: Secondary | ICD-10-CM | POA: Diagnosis not present

## 2022-04-11 DIAGNOSIS — I480 Paroxysmal atrial fibrillation: Secondary | ICD-10-CM | POA: Diagnosis not present

## 2022-04-11 DIAGNOSIS — N400 Enlarged prostate without lower urinary tract symptoms: Secondary | ICD-10-CM | POA: Diagnosis not present

## 2022-04-11 DIAGNOSIS — E119 Type 2 diabetes mellitus without complications: Secondary | ICD-10-CM | POA: Diagnosis not present

## 2022-04-11 DIAGNOSIS — R531 Weakness: Secondary | ICD-10-CM | POA: Diagnosis not present

## 2022-04-14 ENCOUNTER — Encounter (HOSPITAL_COMMUNITY): Payer: Medicare HMO

## 2022-04-14 ENCOUNTER — Ambulatory Visit: Payer: Medicare HMO | Admitting: Surgery

## 2022-04-15 ENCOUNTER — Encounter (HOSPITAL_COMMUNITY): Payer: Self-pay

## 2022-04-17 LAB — POCT HEMOGLOBIN-HEMACUE: Hemoglobin: 12.6 g/dL — ABNORMAL LOW (ref 13.0–17.0)

## 2022-04-22 ENCOUNTER — Encounter: Payer: Medicare HMO | Admitting: Internal Medicine

## 2022-04-28 DIAGNOSIS — B351 Tinea unguium: Secondary | ICD-10-CM | POA: Diagnosis not present

## 2022-04-28 DIAGNOSIS — I739 Peripheral vascular disease, unspecified: Secondary | ICD-10-CM | POA: Diagnosis not present

## 2022-04-28 DIAGNOSIS — L603 Nail dystrophy: Secondary | ICD-10-CM | POA: Diagnosis not present

## 2022-04-28 DIAGNOSIS — L84 Corns and callosities: Secondary | ICD-10-CM | POA: Diagnosis not present

## 2022-05-01 DIAGNOSIS — E875 Hyperkalemia: Secondary | ICD-10-CM | POA: Diagnosis not present

## 2022-05-01 DIAGNOSIS — R531 Weakness: Secondary | ICD-10-CM | POA: Diagnosis not present

## 2022-05-01 DIAGNOSIS — N400 Enlarged prostate without lower urinary tract symptoms: Secondary | ICD-10-CM | POA: Diagnosis not present

## 2022-05-01 DIAGNOSIS — I480 Paroxysmal atrial fibrillation: Secondary | ICD-10-CM | POA: Diagnosis not present

## 2022-05-01 DIAGNOSIS — I10 Essential (primary) hypertension: Secondary | ICD-10-CM | POA: Diagnosis not present

## 2022-05-01 DIAGNOSIS — N185 Chronic kidney disease, stage 5: Secondary | ICD-10-CM | POA: Diagnosis not present

## 2022-05-06 DIAGNOSIS — N185 Chronic kidney disease, stage 5: Secondary | ICD-10-CM | POA: Diagnosis not present

## 2022-05-06 DIAGNOSIS — I10 Essential (primary) hypertension: Secondary | ICD-10-CM | POA: Diagnosis not present

## 2022-05-06 DIAGNOSIS — E785 Hyperlipidemia, unspecified: Secondary | ICD-10-CM | POA: Diagnosis not present

## 2022-05-06 DIAGNOSIS — K5900 Constipation, unspecified: Secondary | ICD-10-CM | POA: Diagnosis not present

## 2022-05-09 DIAGNOSIS — R69 Illness, unspecified: Secondary | ICD-10-CM | POA: Diagnosis not present

## 2022-05-13 DIAGNOSIS — I120 Hypertensive chronic kidney disease with stage 5 chronic kidney disease or end stage renal disease: Secondary | ICD-10-CM | POA: Diagnosis not present

## 2022-05-13 DIAGNOSIS — E875 Hyperkalemia: Secondary | ICD-10-CM | POA: Diagnosis not present

## 2022-05-13 DIAGNOSIS — N185 Chronic kidney disease, stage 5: Secondary | ICD-10-CM | POA: Diagnosis not present

## 2022-05-13 DIAGNOSIS — I480 Paroxysmal atrial fibrillation: Secondary | ICD-10-CM | POA: Diagnosis not present

## 2022-05-13 DIAGNOSIS — E785 Hyperlipidemia, unspecified: Secondary | ICD-10-CM | POA: Diagnosis not present

## 2022-05-29 DIAGNOSIS — N185 Chronic kidney disease, stage 5: Secondary | ICD-10-CM | POA: Diagnosis not present

## 2022-05-29 DIAGNOSIS — E785 Hyperlipidemia, unspecified: Secondary | ICD-10-CM | POA: Diagnosis not present

## 2022-05-29 DIAGNOSIS — E875 Hyperkalemia: Secondary | ICD-10-CM | POA: Diagnosis not present

## 2022-05-29 DIAGNOSIS — L299 Pruritus, unspecified: Secondary | ICD-10-CM | POA: Diagnosis not present

## 2022-05-29 DIAGNOSIS — I120 Hypertensive chronic kidney disease with stage 5 chronic kidney disease or end stage renal disease: Secondary | ICD-10-CM | POA: Diagnosis not present

## 2022-05-29 DIAGNOSIS — I480 Paroxysmal atrial fibrillation: Secondary | ICD-10-CM | POA: Diagnosis not present

## 2022-05-31 DIAGNOSIS — E119 Type 2 diabetes mellitus without complications: Secondary | ICD-10-CM | POA: Diagnosis not present

## 2022-06-02 DIAGNOSIS — I639 Cerebral infarction, unspecified: Secondary | ICD-10-CM | POA: Diagnosis not present

## 2022-06-02 DIAGNOSIS — I129 Hypertensive chronic kidney disease with stage 1 through stage 4 chronic kidney disease, or unspecified chronic kidney disease: Secondary | ICD-10-CM | POA: Diagnosis not present

## 2022-06-02 DIAGNOSIS — E1122 Type 2 diabetes mellitus with diabetic chronic kidney disease: Secondary | ICD-10-CM | POA: Diagnosis not present

## 2022-06-10 DIAGNOSIS — E785 Hyperlipidemia, unspecified: Secondary | ICD-10-CM | POA: Diagnosis not present

## 2022-06-10 DIAGNOSIS — I480 Paroxysmal atrial fibrillation: Secondary | ICD-10-CM | POA: Diagnosis not present

## 2022-06-10 DIAGNOSIS — I120 Hypertensive chronic kidney disease with stage 5 chronic kidney disease or end stage renal disease: Secondary | ICD-10-CM | POA: Diagnosis not present

## 2022-06-10 DIAGNOSIS — E119 Type 2 diabetes mellitus without complications: Secondary | ICD-10-CM | POA: Diagnosis not present

## 2022-07-03 DIAGNOSIS — E785 Hyperlipidemia, unspecified: Secondary | ICD-10-CM | POA: Diagnosis not present

## 2022-07-03 DIAGNOSIS — I120 Hypertensive chronic kidney disease with stage 5 chronic kidney disease or end stage renal disease: Secondary | ICD-10-CM | POA: Diagnosis not present

## 2022-07-03 DIAGNOSIS — E119 Type 2 diabetes mellitus without complications: Secondary | ICD-10-CM | POA: Diagnosis not present

## 2022-07-03 DIAGNOSIS — I480 Paroxysmal atrial fibrillation: Secondary | ICD-10-CM | POA: Diagnosis not present

## 2022-07-10 DIAGNOSIS — R69 Illness, unspecified: Secondary | ICD-10-CM | POA: Diagnosis not present

## 2022-07-11 DIAGNOSIS — E1142 Type 2 diabetes mellitus with diabetic polyneuropathy: Secondary | ICD-10-CM | POA: Diagnosis not present

## 2022-07-11 DIAGNOSIS — I739 Peripheral vascular disease, unspecified: Secondary | ICD-10-CM | POA: Diagnosis not present

## 2022-07-11 DIAGNOSIS — E785 Hyperlipidemia, unspecified: Secondary | ICD-10-CM | POA: Diagnosis not present

## 2022-07-11 DIAGNOSIS — N400 Enlarged prostate without lower urinary tract symptoms: Secondary | ICD-10-CM | POA: Diagnosis not present

## 2022-07-11 DIAGNOSIS — I10 Essential (primary) hypertension: Secondary | ICD-10-CM | POA: Diagnosis not present

## 2022-07-11 DIAGNOSIS — N185 Chronic kidney disease, stage 5: Secondary | ICD-10-CM | POA: Diagnosis not present

## 2022-07-11 DIAGNOSIS — I480 Paroxysmal atrial fibrillation: Secondary | ICD-10-CM | POA: Diagnosis not present

## 2022-07-30 DIAGNOSIS — R69 Illness, unspecified: Secondary | ICD-10-CM | POA: Diagnosis not present

## 2022-07-30 DIAGNOSIS — F4321 Adjustment disorder with depressed mood: Secondary | ICD-10-CM | POA: Diagnosis not present

## 2022-08-06 DIAGNOSIS — S90111A Contusion of right great toe without damage to nail, initial encounter: Secondary | ICD-10-CM | POA: Diagnosis not present

## 2022-08-06 DIAGNOSIS — I739 Peripheral vascular disease, unspecified: Secondary | ICD-10-CM | POA: Diagnosis not present

## 2022-08-06 DIAGNOSIS — L603 Nail dystrophy: Secondary | ICD-10-CM | POA: Diagnosis not present

## 2022-08-06 DIAGNOSIS — L84 Corns and callosities: Secondary | ICD-10-CM | POA: Diagnosis not present

## 2022-08-07 DIAGNOSIS — I10 Essential (primary) hypertension: Secondary | ICD-10-CM | POA: Diagnosis not present

## 2022-08-07 DIAGNOSIS — N185 Chronic kidney disease, stage 5: Secondary | ICD-10-CM | POA: Diagnosis not present

## 2022-08-07 DIAGNOSIS — I739 Peripheral vascular disease, unspecified: Secondary | ICD-10-CM | POA: Diagnosis not present

## 2022-08-07 DIAGNOSIS — I480 Paroxysmal atrial fibrillation: Secondary | ICD-10-CM | POA: Diagnosis not present

## 2022-08-07 DIAGNOSIS — K5900 Constipation, unspecified: Secondary | ICD-10-CM | POA: Diagnosis not present

## 2022-08-07 DIAGNOSIS — E1142 Type 2 diabetes mellitus with diabetic polyneuropathy: Secondary | ICD-10-CM | POA: Diagnosis not present

## 2022-08-07 DIAGNOSIS — E785 Hyperlipidemia, unspecified: Secondary | ICD-10-CM | POA: Diagnosis not present

## 2022-08-07 DIAGNOSIS — I639 Cerebral infarction, unspecified: Secondary | ICD-10-CM | POA: Diagnosis not present

## 2022-08-11 DIAGNOSIS — F4321 Adjustment disorder with depressed mood: Secondary | ICD-10-CM | POA: Diagnosis not present

## 2022-08-11 DIAGNOSIS — R69 Illness, unspecified: Secondary | ICD-10-CM | POA: Diagnosis not present

## 2022-08-14 DIAGNOSIS — Z515 Encounter for palliative care: Secondary | ICD-10-CM | POA: Diagnosis not present

## 2022-10-28 DIAGNOSIS — I69354 Hemiplegia and hemiparesis following cerebral infarction affecting left non-dominant side: Secondary | ICD-10-CM | POA: Diagnosis not present

## 2022-10-28 DIAGNOSIS — D6869 Other thrombophilia: Secondary | ICD-10-CM | POA: Diagnosis not present

## 2022-10-28 DIAGNOSIS — I10 Essential (primary) hypertension: Secondary | ICD-10-CM | POA: Diagnosis not present

## 2022-10-28 DIAGNOSIS — I25119 Atherosclerotic heart disease of native coronary artery with unspecified angina pectoris: Secondary | ICD-10-CM | POA: Diagnosis not present

## 2022-10-28 DIAGNOSIS — I4891 Unspecified atrial fibrillation: Secondary | ICD-10-CM | POA: Diagnosis not present

## 2022-10-28 DIAGNOSIS — R69 Illness, unspecified: Secondary | ICD-10-CM | POA: Diagnosis not present

## 2022-10-28 DIAGNOSIS — E11620 Type 2 diabetes mellitus with diabetic dermatitis: Secondary | ICD-10-CM | POA: Diagnosis not present

## 2022-10-28 DIAGNOSIS — E1136 Type 2 diabetes mellitus with diabetic cataract: Secondary | ICD-10-CM | POA: Diagnosis not present

## 2022-10-28 DIAGNOSIS — E1151 Type 2 diabetes mellitus with diabetic peripheral angiopathy without gangrene: Secondary | ICD-10-CM | POA: Diagnosis not present

## 2022-10-28 DIAGNOSIS — E1142 Type 2 diabetes mellitus with diabetic polyneuropathy: Secondary | ICD-10-CM | POA: Diagnosis not present

## 2022-10-28 DIAGNOSIS — K219 Gastro-esophageal reflux disease without esophagitis: Secondary | ICD-10-CM | POA: Diagnosis not present

## 2022-11-28 ENCOUNTER — Ambulatory Visit: Payer: Self-pay | Admitting: Licensed Clinical Social Worker

## 2022-11-28 NOTE — Patient Outreach (Signed)
SW removed from care team.  Makita Blow, BSW , MSW, LCSW-A Social Worker [facility]  [phone]   

## 2023-01-22 ENCOUNTER — Encounter (HOSPITAL_COMMUNITY): Payer: Self-pay | Admitting: *Deleted

## 2023-03-11 ENCOUNTER — Telehealth: Payer: Self-pay | Admitting: *Deleted

## 2023-03-11 NOTE — Telephone Encounter (Signed)
Primary Cardiologist:None   Preoperative team, please contact this patient and set up a phone call appointment for further preoperative risk assessment. Please obtain consent and complete medication review. Thank you for your help.   I confirm that guidance regarding antiplatelet and oral anticoagulation therapy has been completed and, if necessary, noted below (none requested).   Emmaline Life, NP-C  03/11/2023, 2:00 PM [address], [address] Office [phone] Fax [phone]

## 2023-03-11 NOTE — Telephone Encounter (Signed)
   Pre-operative Risk Assessment    Patient Name: Derrick Hendrix.  DOB: 1940-12-22 MRN: [id number]     Request for Surgical Clearance    Procedure:  Dental Extraction - Amount of Teeth to be Pulled:  9 SIMPLE   Date of Surgery:  Clearance TBD                                 Surgeon:   Surgeon's Group or Practice Name:  [facility] Phone number:  [phone] Fax number:  [phone]   Type of Clearance Requested:   - Medical    Type of Anesthesia:  Local W EPINEPHRINE   Additional requests/questions:    Astrid Divine   03/11/2023, 1:35 PM

## 2023-03-20 ENCOUNTER — Encounter (HOSPITAL_COMMUNITY): Payer: Self-pay

## 2023-03-20 NOTE — Telephone Encounter (Signed)
Returned call to [place], however, couldn't find a DPR on file for [name].  Derrick Hendrix was advised to call back when pt was with her.

## 2023-03-20 NOTE — Telephone Encounter (Signed)
Patient's niece is requesting a call back from pre-op regarding virtual appointment. She is requesting a call back around 4:00 PM if possible.

## 2023-03-23 ENCOUNTER — Telehealth: Payer: Self-pay | Admitting: *Deleted

## 2023-03-23 NOTE — Telephone Encounter (Signed)
French Ana returning call as instructed while with patient. Transferred to [place], CMA.

## 2023-03-23 NOTE — Telephone Encounter (Signed)
French Ana, pt's niece called the office back today as instructed to have Mr. Lacki with her. See previous notes. I did confirm Mr. Single was on the phone as well. I explained that we did not have a DPR on file and that we will need to get one filled out next time in the office, but for the mean time I will note that the pt has given verbal permission ok to s/w [name].    Pt has been scheduled for tele pre op appt 03/26/23 @ 9:40. Med rec and consent are done. French Ana and the pt thanked me for my help.

## 2023-03-23 NOTE — Telephone Encounter (Signed)
Derrick Hendrix, pt's niece called the office back today as instructed to have Derrick Hendrix with her. See previous notes. I did confirm Derrick Hendrix was on the phone as well. I explained that we did not have a DPR on file and that we will need to get one filled out next time in the office, but for the mean time I will note that the pt has given verbal permission ok to s/w [name].   Pt has been scheduled for tele pre op appt 03/26/23 @ 9:40. Med rec and consent are done. Derrick Hendrix and the pt thanked me for my help.     Patient Consent for Virtual Visit        Derrick Hendrix. has provided verbal consent on 03/23/2023 for a virtual visit (video or telephone).   CONSENT FOR VIRTUAL VISIT FOR:  Derrick Hendrix.  By participating in this virtual visit I agree to the following:  I hereby voluntarily request, consent and authorize [name] and its employed or contracted physicians, physician assistants, nurse practitioners or other licensed health care professionals (the Practitioner), to provide me with telemedicine health care services (the "Services") as deemed necessary by the treating Practitioner. I acknowledge and consent to receive the Services by the Practitioner via telemedicine. I understand that the telemedicine visit will involve communicating with the Practitioner through live audiovisual communication technology and the disclosure of certain medical information by electronic transmission. I acknowledge that I have been given the opportunity to request an in-person assessment or other available alternative prior to the telemedicine visit and am voluntarily participating in the telemedicine visit.  I understand that I have the right to withhold or withdraw my consent to the use of telemedicine in the course of my care at any time, without affecting my right to future care or treatment, and that the Practitioner or I may terminate the telemedicine visit at any time. I understand that I  have the right to inspect all information obtained and/or recorded in the course of the telemedicine visit and may receive copies of available information for a reasonable fee.  I understand that some of the potential risks of receiving the Services via telemedicine include:  Delay or interruption in medical evaluation due to technological equipment failure or disruption; Information transmitted may not be sufficient (e.g. poor resolution of images) to allow for appropriate medical decision making by the Practitioner; and/or  In rare instances, security protocols could fail, causing a breach of personal health information.  Furthermore, I acknowledge that it is my responsibility to provide information about my medical history, conditions and care that is complete and accurate to the best of my ability. I acknowledge that Practitioner's advice, recommendations, and/or decision may be based on factors not within their control, such as incomplete or inaccurate data provided by me or distortions of diagnostic images or specimens that may result from electronic transmissions. I understand that the practice of medicine is not an exact science and that Practitioner makes no warranties or guarantees regarding treatment outcomes. I acknowledge that a copy of this consent can be made available to me via my patient portal [facility] MyChart), or I can request a printed copy by calling the office of [name].    I understand that my insurance will be billed for this visit.   I have read or had this consent read to me. I understand the contents of this consent, which adequately explains the benefits and risks of the Services  being provided via telemedicine.  I have been provided ample opportunity to ask questions regarding this consent and the Services and have had my questions answered to my satisfaction. I give my informed consent for the services to be provided through the use of telemedicine in my  medical care

## 2023-03-25 NOTE — Progress Notes (Unsigned)
Virtual Visit via Telephone Note   Because of Derrick Nivens Jr.'s co-morbid illnesses, he is at least at moderate risk for complications without adequate follow up.  This format is felt to be most appropriate for this patient at this time.  The patient did not have access to video technology/had technical difficulties with video requiring transitioning to audio format only (telephone).  All issues noted in this document were discussed and addressed.  No physical exam could be performed with this format.  Please refer to the patient's chart for his consent to telehealth for [facility].  Evaluation Performed:  Preoperative cardiovascular risk assessment _____________   Date:  03/25/2023   Patient ID:  Derrick Argyle., DOB 06/03/1941, MRN [id number] Patient Location:  Home Provider location:   Office  Primary Care Provider:  No primary care provider on file. Primary Cardiologist:  None  Chief Complaint / Patient Profile   82 y.o. y/o male with a h/o PAD s/p right SFA intervention 2018, hypertension, PAF not on anticoagulation, T2DM, CKD stage IV, CVA who is pending extraction of 9 teeth by night and day dental and presents today for telephonic preoperative cardiovascular risk assessment.  History of Present Illness    Derrick Hendrix. is a 82 y.o. male who presents via audio/video conferencing for a telehealth visit today.  Pt was last seen in cardiology clinic on 03/21/2022 by Francis Dowse, PA-C.  At that time Camran Tietjen. was doing well cardiac standpoint.  The patient is now pending procedure as outlined above. Since his last visit, he ***  Eliquis?***  Past Medical History    Past Medical History:  Diagnosis Date   Abscess of foot without toes, left 08/28/2017   Chronic kidney disease (CKD), stage III (moderate) (HCC)    Constipation 11/07/2014   Daily headache    "lately" (04/17/2016)   Depression    Dizziness 03/07/2016   Headache 06/26/2014   Heart murmur     Hyperkalemia 10/05/2018   Hyperlipemia 11/11/2016   Hypertension    Neuropathic pain 02/06/2015   Osteomyelitis (HCC)    PAD (peripheral artery disease) (HCC)    Paronychia of great toe, right    Preop cardiovascular exam    Right foot pain 02/09/2017   Stable angina (HCC) 06/27/2014   Stroke (HCC) 03/2016   hx of PAD; j"ust lots of headaches since" (04/17/2016)   Vitamin B12 deficiency 06/27/2014   Weight loss, unintentional 06/27/2014   Past Surgical History:  Procedure Laterality Date   ABDOMINAL AORTOGRAM W/LOWER EXTREMITY N/A 03/02/2017   Procedure: Abdominal Aortogram w/Lower Extremity;  Surgeon: Fransisco Hertz, MD;  Location: [facility] INVASIVE CV LAB;  Service: Cardiovascular;  Laterality: N/A;   EP IMPLANTABLE DEVICE N/A 03/04/2016   Procedure: Loop Recorder Insertion;  Surgeon: Hillis Range, MD;  Location: [facility] INVASIVE CV LAB;  Service: Cardiovascular;  Laterality: N/A;   implantable loop recorder removal  12/19/2020   MDT Reveal LINQ removed by Dr  Johney Frame   PERIPHERAL VASCULAR INTERVENTION Right 03/06/2017   Procedure: Peripheral Vascular Intervention;  Surgeon: Sherren Kerns, MD;  Location: [facility] INVASIVE CV LAB;  Service: Cardiovascular;  Laterality: Right;  SFA    Allergies  Allergies  Allergen Reactions   Aspirin Other (See Comments)    Nosebleeds     Home Medications    Prior to Admission medications   Medication Sig Start Date End Date Taking? Authorizing Provider  antiseptic oral rinse (BIOTENE) LIQD Apply 15 mLs topically 2 (two) times  daily. 04/02/22   Quincy Simmonds, MD  camphor-menthol [facility]) lotion Apply topically as needed for itching. 04/02/22   Quincy Simmonds, MD  capsaicin (ZOSTRIX) 0.025 % cream Apply topically 2 (two) times daily. 04/02/22   Quincy Simmonds, MD  diphenhydrAMINE (BENADRYL) 50 MG/ML injection Inject 0.25 mLs (12.5 mg total) into the vein every 4 (four) hours as needed for itching. 04/02/22   Quincy Simmonds, MD  finasteride (PROSCAR) 5 MG tablet TAKE  ONE TABLET BY MOUTH DAILY Patient taking differently: Take 5 mg by mouth daily. 10/02/21   [name], MD  glycopyrrolate (ROBINUL) 1 MG tablet Take 1 tablet (1 mg total) by mouth every 4 (four) hours as needed (excessive secretions). 04/02/22   Quincy Simmonds, MD  haloperidol (HALDOL) 2 MG tablet Take 1 tablet (2 mg total) by mouth every 6 (six) hours as needed for agitation (or delirium). 04/02/22   Quincy Simmonds, MD  HYDROmorphone (DILAUDID) 1 MG/ML injection Inject 0.5-1 mLs (0.5-1 mg total) into the vein every 2 (two) hours as needed for severe pain or moderate pain (dyspnea, increased work of breathing, RR >25, distress). 04/02/22   Quincy Simmonds, MD  LORazepam (ATIVAN) 1 MG tablet Take 1 tablet (1 mg total) by mouth every hour as needed for anxiety, seizure or sleep (distress). 04/02/22   Quincy Simmonds, MD  melatonin 5 MG TABS Take 1 tablet (5 mg total) by mouth at bedtime. 04/02/22   Quincy Simmonds, MD  ondansetron (ZOFRAN-ODT) 4 MG disintegrating tablet Take 1 tablet (4 mg total) by mouth every 6 (six) hours as needed for nausea. 04/02/22   Quincy Simmonds, MD  polyvinyl alcohol (LIQUIFILM TEARS) 1.4 % ophthalmic solution Place 1 drop into both eyes 4 (four) times daily as needed for dry eyes. 04/02/22   Quincy Simmonds, MD  tamsulosin (FLOMAX) 0.4 MG CAPS capsule TAKE TWO CAPSULES BY MOUTH DAILY Patient taking differently: Take 0.8 mg by mouth daily. 10/02/21   [name], MD    Physical Exam    Vital Signs:  Derrick Argyle. does not have vital signs available for review today.***  Given telephonic nature of communication, physical exam is limited. AAOx3. NAD. Normal affect.  Speech and respirations are unlabored.  Accessory Clinical Findings    None  Assessment & Plan    Primary Cardiologist: None  Preoperative cardiovascular risk assessment.  Extraction of 9 teeth by night and day dental.  Chart reviewed as part of pre-operative protocol coverage. According to the  RCRI, patient has a 6.6% risk of MACE. Patient reports activity equivalent to 4.0 METS (***).   Given past medical history and time since last visit, based on ACC/AHA guidelines, Addan Karas. would be at acceptable risk for the planned procedure without further cardiovascular testing.   Patient was advised that if he develops new symptoms prior to surgery to contact our office to arrange a follow-up appointment.  he verbalized understanding.   I will route this recommendation to the requesting party via Epic fax function.  Please call with questions.  Time:   Today, I have spent *** minutes with the patient with telehealth technology discussing medical history, symptoms, and management plan.     Carlos Levering, NP  03/25/2023, 7:01 PM

## 2023-03-26 ENCOUNTER — Ambulatory Visit (INDEPENDENT_AMBULATORY_CARE_PROVIDER_SITE_OTHER): Payer: Medicare HMO

## 2023-03-26 DIAGNOSIS — Z01810 Encounter for preprocedural cardiovascular examination: Secondary | ICD-10-CM

## 2023-03-26 NOTE — Telephone Encounter (Signed)
Per pre op APP pt needs in office appt for pre op clearance. I have cancelled the tele appt for today. S/w the pt's niece and pt has been scheduled for in office appt with Otilio Saber, [facility] 04/01/23 @ 10 am. I will update all parties involved.

## 2023-04-01 ENCOUNTER — Ambulatory Visit: Payer: Medicare HMO | Admitting: Student

## 2023-04-02 NOTE — Progress Notes (Signed)
  Electrophysiology Office Note:   Date:  04/03/2023  ID:  Derrick Argyle., DOB May 27, 1941, MRN [id number]  Primary Cardiologist: None Electrophysiologist: Dr. Johney Frame -> Hillis Range, MD (Inactive)   History of Present Illness:   Derrick Xu. is a 82 y.o. male with h/o CKD (V) tendency towards hyperkalemia, HTN, HLD stroke, AFib, PVD (R SFA intervention 2018), DM, peripheral neuropathy  seen today for cardiac clearance for dental extraction   Since last being seen in our clinic the patient reports doing well overall. Denies chest pain, SOB, or any cardiac awareness. His niece and primary caregiver is here today. She is not sure when he came off of his Eliquis, but denies any issues with bleeding. She said they had trouble with a "facility getting all of his medications wrong" and he is now living with her at home.  They are willing to restart Eliquis for stroke prevention.   Review of systems complete and found to be negative unless listed in HPI.   Studies Reviewed:    EKG is ordered today. Personal review shows Sinus tachycardia at 109 bpm   Physical Exam:   VS:  BP 126/80   Pulse (!) 109   Ht  (1.88 m)   Wt 167 lb 14.4 oz (76.2 kg)   SpO2 98%   BMI 21.56 kg/m    Wt Readings from Last 3 Encounters:  04/03/23 167 lb 14.4 oz (76.2 kg)  03/31/22 169 lb 12.4 oz (77 kg)  03/25/22 174 lb 14.4 oz (79.3 kg)     GEN: Chronically ill appearing, thin. No acute distress NECK: No JVD; No carotid bruits CARDIAC: Slightly tachy but Regular rate and rhythm, no murmurs, rubs, gallops RESPIRATORY:  Clear to auscultation without rales, wheezing or rhonchi  ABDOMEN: Soft, non-tender, non-distended EXTREMITIES:  No edema; No deformity   ASSESSMENT AND PLAN:    Paroxysmal AFib Would resume eliquis for CHA2DS2Vasc is 7 and h/o CVA Appropriately dose at 2.5 for age/Creat Long discussion with family today.  We do not typically stop Eliquis as part of Hospice/goals of care event at end  of life (unless death is imminent and oral medications are not tolerable) as a debilitating stroke would potentially be a very poor way to spend ones final time.  BMET/CBC today.    HTN Stable on current regimen   Cardiac clearance for dental extraction Low risk from cardiac perspective, especially with local anaesthesia.  He was newly resumed on Eliquis today. He may hold that for 48 hours prior to procedure per our office protocol, and then should resume it as soon as safe to do so after.     Follow up with EP APP in 6 months  Signed, Graciella Freer, PA-C

## 2023-04-03 ENCOUNTER — Ambulatory Visit: Payer: Medicare HMO | Attending: Student | Admitting: Student

## 2023-04-03 ENCOUNTER — Encounter: Payer: Self-pay | Admitting: Student

## 2023-04-03 VITALS — BP 126/80 | HR 109 | Ht 74.0 in | Wt 167.9 lb

## 2023-04-03 DIAGNOSIS — Z01810 Encounter for preprocedural cardiovascular examination: Secondary | ICD-10-CM

## 2023-04-03 DIAGNOSIS — I480 Paroxysmal atrial fibrillation: Secondary | ICD-10-CM | POA: Diagnosis not present

## 2023-04-03 DIAGNOSIS — N185 Chronic kidney disease, stage 5: Secondary | ICD-10-CM | POA: Diagnosis not present

## 2023-04-03 DIAGNOSIS — I10 Essential (primary) hypertension: Secondary | ICD-10-CM | POA: Diagnosis not present

## 2023-04-03 MED ORDER — APIXABAN 2.5 MG PO TABS: 2.5000 mg | ORAL_TABLET | Freq: Two times a day (BID) | ORAL | 5 refills | Status: AC

## 2023-04-03 NOTE — Patient Instructions (Signed)
Medication Instructions:  Start eliquis 2.5 mg twice a day *If you need a refill on your cardiac medications before your next appointment, please call your pharmacy*  Lab Work: BMET, CBC--TODAY If you have labs (blood work) drawn today and your tests are completely normal, you will receive your results only by: MyChart Message (if you have MyChart) OR A paper copy in the mail If you have any lab test that is abnormal or we need to change your treatment, we will call you to review the results.  Follow-Up: At [facility], you and your health needs are our priority.  As part of our continuing mission to provide you with exceptional heart care, we have created designated Provider Care Teams.  These Care Teams include your primary Cardiologist (physician) and Advanced Practice Providers (APPs -  Physician Assistants and Nurse Practitioners) who all work together to provide you with the care you need, when you need it.  Your next appointment:   6 month(s)  Provider:   Casimiro Needle "Otilio Saber, PA-C

## 2023-04-03 NOTE — Addendum Note (Signed)
Addended by: Valrie Hart on: 04/03/2023 09:54 AM   Modules accepted: Orders

## 2023-04-04 ENCOUNTER — Other Ambulatory Visit: Payer: Self-pay

## 2023-04-04 ENCOUNTER — Observation Stay (HOSPITAL_COMMUNITY)
Admission: EM | Admit: 2023-04-04 | Discharge: 2023-04-05 | Disposition: A | Discharge status: Home / Self Care | Attending: Internal Medicine | Admitting: Internal Medicine

## 2023-04-04 ENCOUNTER — Encounter (HOSPITAL_COMMUNITY): Payer: Self-pay

## 2023-04-04 ENCOUNTER — Emergency Department (HOSPITAL_COMMUNITY)

## 2023-04-04 DIAGNOSIS — Z8673 Personal history of transient ischemic attack (TIA), and cerebral infarction without residual deficits: Secondary | ICD-10-CM | POA: Diagnosis not present

## 2023-04-04 DIAGNOSIS — N185 Chronic kidney disease, stage 5: Secondary | ICD-10-CM | POA: Diagnosis not present

## 2023-04-04 DIAGNOSIS — E875 Hyperkalemia: Principal | ICD-10-CM | POA: Diagnosis present

## 2023-04-04 DIAGNOSIS — Z87891 Personal history of nicotine dependence: Secondary | ICD-10-CM | POA: Insufficient documentation

## 2023-04-04 DIAGNOSIS — Z7901 Long term (current) use of anticoagulants: Secondary | ICD-10-CM | POA: Diagnosis not present

## 2023-04-04 DIAGNOSIS — Z8679 Personal history of other diseases of the circulatory system: Secondary | ICD-10-CM | POA: Diagnosis not present

## 2023-04-04 DIAGNOSIS — E1122 Type 2 diabetes mellitus with diabetic chronic kidney disease: Secondary | ICD-10-CM | POA: Insufficient documentation

## 2023-04-04 DIAGNOSIS — N179 Acute kidney failure, unspecified: Secondary | ICD-10-CM | POA: Insufficient documentation

## 2023-04-04 DIAGNOSIS — I480 Paroxysmal atrial fibrillation: Secondary | ICD-10-CM | POA: Diagnosis present

## 2023-04-04 DIAGNOSIS — R000 Tachycardia, unspecified: Secondary | ICD-10-CM | POA: Diagnosis not present

## 2023-04-04 DIAGNOSIS — I120 Hypertensive chronic kidney disease with stage 5 chronic kidney disease or end stage renal disease: Secondary | ICD-10-CM | POA: Insufficient documentation

## 2023-04-04 DIAGNOSIS — R109 Unspecified abdominal pain: Secondary | ICD-10-CM | POA: Insufficient documentation

## 2023-04-04 DIAGNOSIS — I4891 Unspecified atrial fibrillation: Secondary | ICD-10-CM | POA: Diagnosis not present

## 2023-04-04 DIAGNOSIS — I63532 Cerebral infarction due to unspecified occlusion or stenosis of left posterior cerebral artery: Secondary | ICD-10-CM | POA: Diagnosis present

## 2023-04-04 DIAGNOSIS — I10 Essential (primary) hypertension: Secondary | ICD-10-CM | POA: Diagnosis present

## 2023-04-04 LAB — CBC WITH DIFFERENTIAL/PLATELET
Abs Immature Granulocytes: 0.07 10*3/uL (ref 0.00–0.07)
Basophils Absolute: 0.1 10*3/uL (ref 0.0–0.1)
Basophils Relative: 1 %
Eosinophils Absolute: 0.2 10*3/uL (ref 0.0–0.5)
Eosinophils Relative: 2 %
HCT: 26 % — ABNORMAL LOW (ref 39.0–52.0)
Hemoglobin: 8.1 g/dL — ABNORMAL LOW (ref 13.0–17.0)
Immature Granulocytes: 1 %
Lymphocytes Relative: 17 %
Lymphs Abs: 1.9 10*3/uL (ref 0.7–4.0)
MCH: 31.6 pg (ref 26.0–34.0)
MCHC: 31.2 g/dL (ref 30.0–36.0)
MCV: 101.6 fL — ABNORMAL HIGH (ref 80.0–100.0)
Monocytes Absolute: 0.5 10*3/uL (ref 0.1–1.0)
Monocytes Relative: 5 %
Neutro Abs: 8.2 10*3/uL — ABNORMAL HIGH (ref 1.7–7.7)
Neutrophils Relative %: 74 %
Platelets: 311 10*3/uL (ref 150–400)
RBC: 2.56 MIL/uL — ABNORMAL LOW (ref 4.22–5.81)
RDW: 15.4 % (ref 11.5–15.5)
WBC: 10.9 10*3/uL — ABNORMAL HIGH (ref 4.0–10.5)
nRBC: 0 % (ref 0.0–0.2)

## 2023-04-04 LAB — I-STAT CHEM 8, ED
BUN: 60 mg/dL — ABNORMAL HIGH (ref 8–23)
Calcium, Ion: 1.1 mmol/L — ABNORMAL LOW (ref 1.15–1.40)
Chloride: 114 mmol/L — ABNORMAL HIGH (ref 98–111)
Creatinine, Ser: 9 mg/dL — ABNORMAL HIGH (ref 0.61–1.24)
Glucose, Bld: 225 mg/dL — ABNORMAL HIGH (ref 70–99)
HCT: 27 % — ABNORMAL LOW (ref 39.0–52.0)
Hemoglobin: 9.2 g/dL — ABNORMAL LOW (ref 13.0–17.0)
Potassium: 5.7 mmol/L — ABNORMAL HIGH (ref 3.5–5.1)
Sodium: 135 mmol/L (ref 135–145)
TCO2: 16 mmol/L — ABNORMAL LOW (ref 22–32)

## 2023-04-04 LAB — BASIC METABOLIC PANEL
Anion gap: 11 (ref 5–15)
BUN: 56 mg/dL — ABNORMAL HIGH (ref 8–23)
CO2: 15 mmol/L — ABNORMAL LOW (ref 22–32)
Calcium: 8.4 mg/dL — ABNORMAL LOW (ref 8.9–10.3)
Chloride: 109 mmol/L (ref 98–111)
Creatinine, Ser: 7.47 mg/dL — ABNORMAL HIGH (ref 0.61–1.24)
GFR, Estimated: 7 mL/min — ABNORMAL LOW (ref >60)
Glucose, Bld: 108 mg/dL — ABNORMAL HIGH (ref 70–99)
Potassium: 5.8 mmol/L — ABNORMAL HIGH (ref 3.5–5.1)
Sodium: 135 mmol/L (ref 135–145)

## 2023-04-04 LAB — MAGNESIUM: Magnesium: 2.3 mg/dL (ref 1.7–2.4)

## 2023-04-04 MED ORDER — LACTATED RINGERS IV BOLUS
500.0000 mL | Freq: Once | INTRAVENOUS | Status: AC
  Administered 2023-04-04: 500 mL via INTRAVENOUS

## 2023-04-04 MED ORDER — ONDANSETRON HCL 4 MG/2ML IJ SOLN: 4.0000 mg | Freq: Four times a day (QID) | INTRAMUSCULAR | Status: DC | PRN

## 2023-04-04 NOTE — ED Provider Notes (Signed)
[name] Provider Note   CSN: [id number] Arrival date & time: 04/04/23  1538     History {Add pertinent medical, surgical, social history, OB history to HPI:1} Chief Complaint  Patient presents with   Abdominal Pain    Derrick Hendrix. is a 82 y.o. male with CKD stage V w/ hyperkalemia tendency, HTN, HLD, CVA PVD (R SFA intervention 2018), T2DM, peripheral neuropathy who presents with abd pain, potassium level abnormal.   Patient presents to have his potassium rechecked. Drawn yesterday at cardiology office, and it has come back 6.7. Three days ago for patient had to have clearance for dental surgery, and patient no complaints. Patient is not a dialysis patient, niece at bedside relays that he used to have severe tremors and couldn't get dialysis.   Patient complains of no pain anywhere, had an episode of nausea earlier in the waiting room of the ED but that has resolved now. Denies back pain, urinary symptoms, leg swelling, SOB, CP, f/c.   Abdominal Pain      Home Medications Prior to Admission medications   Medication Sig Start Date End Date Taking? Authorizing Provider  antiseptic oral rinse (BIOTENE) LIQD Apply 15 mLs topically 2 (two) times daily. 04/02/22   Quincy Simmonds, MD  apixaban (ELIQUIS) 2.5 MG TABS tablet Take 1 tablet (2.5 mg total) by mouth 2 (two) times daily. 04/03/23   Graciella Freer, PA-C  camphor-menthol [facility]) lotion Apply topically as needed for itching. 04/02/22   Quincy Simmonds, MD  capsaicin (ZOSTRIX) 0.025 % cream Apply topically 2 (two) times daily. 04/02/22   Quincy Simmonds, MD  diphenhydrAMINE (BENADRYL) 50 MG/ML injection Inject 0.25 mLs (12.5 mg total) into the vein every 4 (four) hours as needed for itching. 04/02/22   Quincy Simmonds, MD  finasteride (PROSCAR) 5 MG tablet TAKE ONE TABLET BY MOUTH DAILY Patient taking differently: Take 5 mg by mouth daily. 10/02/21   [name], MD   glycopyrrolate (ROBINUL) 1 MG tablet Take 1 tablet (1 mg total) by mouth every 4 (four) hours as needed (excessive secretions). 04/02/22   Quincy Simmonds, MD  haloperidol (HALDOL) 2 MG tablet Take 1 tablet (2 mg total) by mouth every 6 (six) hours as needed for agitation (or delirium). 04/02/22   Quincy Simmonds, MD  HYDROmorphone (DILAUDID) 1 MG/ML injection Inject 0.5-1 mLs (0.5-1 mg total) into the vein every 2 (two) hours as needed for severe pain or moderate pain (dyspnea, increased work of breathing, RR >25, distress). 04/02/22   Quincy Simmonds, MD  LORazepam (ATIVAN) 1 MG tablet Take 1 tablet (1 mg total) by mouth every hour as needed for anxiety, seizure or sleep (distress). 04/02/22   Quincy Simmonds, MD  ondansetron (ZOFRAN-ODT) 4 MG disintegrating tablet Take 1 tablet (4 mg total) by mouth every 6 (six) hours as needed for nausea. 04/02/22   Quincy Simmonds, MD  tamsulosin (FLOMAX) 0.4 MG CAPS capsule TAKE TWO CAPSULES BY MOUTH DAILY Patient taking differently: Take 0.8 mg by mouth daily. 10/02/21   [name], MD      Allergies    Aspirin    Review of Systems   Review of Systems  Gastrointestinal:  Positive for abdominal pain.   Review of systems Negative for f/c.  A 10 point review of systems was performed and is negative unless otherwise reported in HPI.  Physical Exam Updated Vital Signs BP 139/64 (BP Location: Left Arm)   Pulse (!) 104   Temp 99.1 F (  37.3 C)   Resp 15   SpO2 93%  Physical Exam General: Normal appearing male, lying in bed.  HEENT: PERRLA, Sclera anicteric, MMM, trachea midline.  Cardiology: RRR, no murmurs/rubs/gallops. BL radial and DP pulses equal bilaterally.  Resp: Normal respiratory rate and effort. CTAB, no wheezes, rhonchi, crackles.  Abd: Soft, non-tender, non-distended. No rebound tenderness or guarding.  GU: Deferred. MSK: No peripheral edema or signs of trauma. Extremities without deformity or TTP. No cyanosis or clubbing. Skin: warm,  dry. No rashes or lesions. Back: No CVA tenderness Neuro: A&Ox4, CNs II-XII grossly intact. MAEs. Sensation grossly intact.  Psych: Normal mood and affect.   ED Results / Procedures / Treatments   Labs (all labs ordered are listed, but only abnormal results are displayed) Labs Reviewed  BASIC METABOLIC PANEL - Abnormal; Notable for the following components:      Result Value   Potassium 5.8 (*)    CO2 15 (*)    Glucose, Bld 108 (*)    BUN 56 (*)    Creatinine, Ser 7.47 (*)    Calcium 8.4 (*)    GFR, Estimated 7 (*)    All other components within normal limits  MAGNESIUM  CALCIUM, IONIZED  CBC WITH DIFFERENTIAL/PLATELET  I-STAT CHEM 8, ED    EKG None  Radiology No results found.  Procedures Procedures  {Document cardiac monitor, telemetry assessment procedure when appropriate:1}  Medications Ordered in ED Medications - No data to display  ED Course/ Medical Decision Making/ A&P                          Medical Decision Making Amount and/or Complexity of Data Reviewed Labs: ordered.    This patient presents to the ED for concern of possible hyperkalemia; this involves an extensive number of treatment options, and is a complaint that carries with it a high risk of complications and morbidity.  I considered the following differential and admission for this acute, potentially life threatening condition.   MDM:    DDX of AKI includes pre-renal etiology such as hypovolemia 2/2 GI losses or decreased PO intake; renal etiology such as ATN or nephrotoxins; post renal etiology such as obstruction. Will evaluate etiology of AKI with urine electrolytes and renal ultrasound.   Clinical Course as of 04/04/23 2041  Sat Apr 04, 2023  2040 Creatinine(!): 9.00 [HN]    Clinical Course User Index [HN] Loetta Rough, MD    Labs: I Ordered, and personally interpreted labs.  The pertinent results include:  those listed above  Imaging Studies ordered: I ordered imaging  studies including *** I independently visualized and interpreted imaging. I agree with the radiologist interpretation  Additional history obtained from niece at bedside, chart review.    Cardiac Monitoring: The patient was maintained on a cardiac monitor.  I personally viewed and interpreted the cardiac monitored which showed an underlying rhythm of: NSR  Reevaluation: After the interventions noted above, I reevaluated the patient and found that they have :stayed the same  Social Determinants of Health: Patient lives with his niece   Disposition:  ***  Co morbidities that complicate the patient evaluation  Past Medical History:  Diagnosis Date   Abscess of foot without toes, left 08/28/2017   Chronic kidney disease (CKD), stage III (moderate)    Constipation 11/07/2014   Daily headache    "lately" (04/17/2016)   Depression    Dizziness 03/07/2016   Headache 06/26/2014   Heart  murmur    Hyperkalemia 10/05/2018   Hyperlipemia 11/11/2016   Hypertension    Neuropathic pain 02/06/2015   Osteomyelitis    PAD (peripheral artery disease)    Paronychia of great toe, right    Preop cardiovascular exam    Right foot pain 02/09/2017   Stable angina 06/27/2014   Stroke 03/2016   hx of PAD; j"ust lots of headaches since" (04/17/2016)   Vitamin B12 deficiency 06/27/2014   Weight loss, unintentional 06/27/2014     Medicines No orders of the defined types were placed in this encounter.   I have reviewed the patients home medicines and have made adjustments as needed  Problem List / ED Course: Problem List Items Addressed This Visit   None        {Document critical care time when appropriate:1} {Document review of labs and clinical decision tools ie heart score, Chads2Vasc2 etc:1}  {Document your independent review of radiology images, and any outside records:1} {Document your discussion with family members, caretakers, and with consultants:1} {Document social determinants of health  affecting pt's care:1} {Document your decision making why or why not admission, treatments were needed:1}  This note was created using dictation software, which may contain spelling or grammatical errors.

## 2023-04-04 NOTE — ED Notes (Signed)
Fall risk bracelet, sock, and magnet in place.

## 2023-04-04 NOTE — ED Notes (Signed)
Phlebotomy at bedside drawing labs.

## 2023-04-04 NOTE — ED Triage Notes (Signed)
Patient here to have potassium rechecked. Drawn yesterday at cardiology office and it has come back 6.7. Three days ago 4. Patient had to have clearance for dental surgery, no complaints

## 2023-04-05 DIAGNOSIS — I63532 Cerebral infarction due to unspecified occlusion or stenosis of left posterior cerebral artery: Secondary | ICD-10-CM

## 2023-04-05 DIAGNOSIS — N185 Chronic kidney disease, stage 5: Secondary | ICD-10-CM

## 2023-04-05 DIAGNOSIS — I10 Essential (primary) hypertension: Secondary | ICD-10-CM

## 2023-04-05 DIAGNOSIS — E875 Hyperkalemia: Secondary | ICD-10-CM | POA: Diagnosis not present

## 2023-04-05 DIAGNOSIS — I480 Paroxysmal atrial fibrillation: Secondary | ICD-10-CM

## 2023-04-05 LAB — BASIC METABOLIC PANEL
Anion gap: 17 — ABNORMAL HIGH (ref 5–15)
Anion gap: 12 (ref 5–15)
BUN: 63 mg/dL — ABNORMAL HIGH (ref 8–23)
BUN: 60 mg/dL — ABNORMAL HIGH (ref 8–23)
CO2: 9 mmol/L — ABNORMAL LOW (ref 22–32)
CO2: 14 mmol/L — ABNORMAL LOW (ref 22–32)
Calcium: 8.4 mg/dL — ABNORMAL LOW (ref 8.9–10.3)
Calcium: 8.4 mg/dL — ABNORMAL LOW (ref 8.9–10.3)
Chloride: 111 mmol/L (ref 98–111)
Chloride: 110 mmol/L (ref 98–111)
Creatinine, Ser: 7.71 mg/dL — ABNORMAL HIGH (ref 0.61–1.24)
Creatinine, Ser: 7.52 mg/dL — ABNORMAL HIGH (ref 0.61–1.24)
GFR, Estimated: 6 mL/min — ABNORMAL LOW (ref >60)
GFR, Estimated: 7 mL/min — ABNORMAL LOW (ref >60)
Glucose, Bld: 115 mg/dL — ABNORMAL HIGH (ref 70–99)
Glucose, Bld: 85 mg/dL (ref 70–99)
Potassium: 6.7 mmol/L (ref 3.5–5.1)
Potassium: 5.7 mmol/L — ABNORMAL HIGH (ref 3.5–5.1)
Sodium: 137 mmol/L (ref 135–145)
Sodium: 136 mmol/L (ref 135–145)

## 2023-04-05 LAB — POTASSIUM: Potassium: 5.3 mmol/L — ABNORMAL HIGH (ref 3.5–5.1)

## 2023-04-05 MED ORDER — TAMSULOSIN HCL 0.4 MG PO CAPS
0.8000 mg | ORAL_CAPSULE | Freq: Every day | ORAL | Status: DC
  Administered 2023-04-05: 0.8 mg via ORAL
  Filled 2023-04-05: qty 2

## 2023-04-05 MED ORDER — SODIUM ZIRCONIUM CYCLOSILICATE 10 G PO PACK
10.0000 g | PACK | Freq: Three times a day (TID) | ORAL | Status: DC
  Administered 2023-04-05: 10 g via ORAL
  Filled 2023-04-05: qty 1

## 2023-04-05 MED ORDER — DIPHENHYDRAMINE HCL 50 MG/ML IJ SOLN: 12.5000 mg | INTRAMUSCULAR | Status: DC | PRN

## 2023-04-05 MED ORDER — SODIUM POLYSTYRENE SULFONATE 15 GM/60ML PO SUSP: 30.0000 g | Freq: Once | ORAL | Status: DC

## 2023-04-05 MED ORDER — APIXABAN 2.5 MG PO TABS
2.5000 mg | ORAL_TABLET | Freq: Two times a day (BID) | ORAL | Status: DC
  Administered 2023-04-05: 2.5 mg via ORAL
  Filled 2023-04-05: qty 1

## 2023-04-05 MED ORDER — MEDIHONEY WOUND/BURN DRESSING EX PSTE
1.0000 | PASTE | Freq: Every day | CUTANEOUS | Status: DC
  Administered 2023-04-05: 1 via TOPICAL
  Filled 2023-04-05: qty 44

## 2023-04-05 MED ORDER — FENTANYL CITRATE PF 50 MCG/ML IJ SOSY: 12.5000 ug | PREFILLED_SYRINGE | INTRAMUSCULAR | Status: DC | PRN

## 2023-04-05 MED ORDER — GLYCOPYRROLATE 1 MG PO TABS: 1.0000 mg | ORAL_TABLET | ORAL | Status: DC | PRN

## 2023-04-05 MED ORDER — ONDANSETRON HCL 4 MG/2ML IJ SOLN: 4.0000 mg | Freq: Four times a day (QID) | INTRAMUSCULAR | Status: DC | PRN

## 2023-04-05 MED ORDER — ONDANSETRON 4 MG PO TBDP: 4.0000 mg | ORAL_TABLET | Freq: Four times a day (QID) | ORAL | Status: DC | PRN

## 2023-04-05 MED ORDER — HALOPERIDOL 1 MG PO TABS: 2.0000 mg | ORAL_TABLET | Freq: Four times a day (QID) | ORAL | Status: DC | PRN

## 2023-04-05 MED ORDER — LORAZEPAM 1 MG PO TABS: 1.0000 mg | ORAL_TABLET | ORAL | Status: DC | PRN

## 2023-04-05 MED ORDER — FINASTERIDE 5 MG PO TABS
5.0000 mg | ORAL_TABLET | Freq: Every day | ORAL | Status: DC
  Administered 2023-04-05: 5 mg via ORAL
  Filled 2023-04-05: qty 1

## 2023-04-05 NOTE — H&P (Signed)
History and Physical    Patient: Derrick Hendrix. ZOX:[id number] DOB: Oct 24, 1941 DOA: 04/04/2023 DOS: the patient was seen and examined on 04/05/2023 PCP: Modena Slater, DO  Patient coming from: Home  Chief Complaint:  Chief Complaint  Patient presents with   Abdominal Pain   HPI: Derrick Hendrix. is a 82 y.o. male with medical history significant of HTN, HLD, PVD, CVA, T2DM and hospice care for end-stage renal disease who declines dialysis.  Patient saw cardiology for clearance for removal of the remaining teeth in his mouth.  As part of that workup they did get labs and noted him to have an elevated potassium of 6.7.  He was advised to come to the ED.  His nieces who are his primary caregivers asked for admission.  On evaluation in the ED his potassium was down to 5.8, and his creatinine was elevated at 7.47.  His BUN was 56 this is previously been in the 110 range.  On evaluation the patient is still adamant about his desires to not do dialysis and to remain under hospice care.  Review of Systems: As mentioned in the history of present illness. All other systems reviewed and are negative. Past Medical History:  Diagnosis Date   Abscess of foot without toes, left 08/28/2017   Chronic kidney disease (CKD), stage III (moderate)    Constipation 11/07/2014   Daily headache    "lately" (04/17/2016)   Depression    Dizziness 03/07/2016   Headache 06/26/2014   Heart murmur    Hyperkalemia 10/05/2018   Hyperlipemia 11/11/2016   Hypertension    Neuropathic pain 02/06/2015   Osteomyelitis    PAD (peripheral artery disease)    Paronychia of great toe, right    Preop cardiovascular exam    Right foot pain 02/09/2017   Stable angina 06/27/2014   Stroke 03/2016   hx of PAD; j"ust lots of headaches since" (04/17/2016)   Vitamin B12 deficiency 06/27/2014   Weight loss, unintentional 06/27/2014   Past Surgical History:  Procedure Laterality Date   ABDOMINAL AORTOGRAM W/LOWER EXTREMITY N/A 03/02/2017    Procedure: Abdominal Aortogram w/Lower Extremity;  Surgeon: Fransisco Hertz, MD;  Location[facility] INVASIVE CV LAB;  Service: Cardiovascular;  Laterality: N/A;   EP IMPLANTABLE DEVICE N/A 03/04/2016   Procedure: Loop Recorder Insertion;  Surgeon: Hillis Range, MD;  Location: [facility] INVASIVE CV LAB;  Service: Cardiovascular;  Laterality: N/A;   implantable loop recorder removal  12/19/2020   MDT Reveal LINQ removed by Dr  Johney Frame   PERIPHERAL VASCULAR INTERVENTION Right 03/06/2017   Procedure: Peripheral Vascular Intervention;  Surgeon: Sherren Kerns, MD;  Location: [facility] INVASIVE CV LAB;  Service: Cardiovascular;  Laterality: Right;  SFA   Social History:  reports that he has quit smoking. His smoking use included cigarettes. He started smoking about 5 years ago. He has never used smokeless tobacco. He reports that he does not currently use alcohol. He reports that he does not use drugs.  Allergies  Allergen Reactions   Aspirin Other (See Comments)    Nosebleeds     Family History  Problem Relation Age of Onset   Hypertension Mother        died age 63   Diabetes Mother     Prior to Admission medications   Medication Sig Start Date End Date Taking? Authorizing Provider  antiseptic oral rinse (BIOTENE) LIQD Apply 15 mLs topically 2 (two) times daily. 04/02/22   Quincy Simmonds, MD  apixaban (ELIQUIS) 2.5 MG TABS  tablet Take 1 tablet (2.5 mg total) by mouth 2 (two) times daily. 04/03/23   Graciella Freer, PA-C  camphor-menthol [facility]) lotion Apply topically as needed for itching. 04/02/22   Quincy Simmonds, MD  capsaicin (ZOSTRIX) 0.025 % cream Apply topically 2 (two) times daily. 04/02/22   Quincy Simmonds, MD  diphenhydrAMINE (BENADRYL) 50 MG/ML injection Inject 0.25 mLs (12.5 mg total) into the vein every 4 (four) hours as needed for itching. 04/02/22   Quincy Simmonds, MD  finasteride (PROSCAR) 5 MG tablet TAKE ONE TABLET BY MOUTH DAILY Patient taking differently: Take 5 mg by mouth daily. 10/02/21    [name], MD  glycopyrrolate (ROBINUL) 1 MG tablet Take 1 tablet (1 mg total) by mouth every 4 (four) hours as needed (excessive secretions). 04/02/22   Quincy Simmonds, MD  haloperidol (HALDOL) 2 MG tablet Take 1 tablet (2 mg total) by mouth every 6 (six) hours as needed for agitation (or delirium). 04/02/22   Quincy Simmonds, MD  HYDROmorphone (DILAUDID) 1 MG/ML injection Inject 0.5-1 mLs (0.5-1 mg total) into the vein every 2 (two) hours as needed for severe pain or moderate pain (dyspnea, increased work of breathing, RR >25, distress). 04/02/22   Quincy Simmonds, MD  LORazepam (ATIVAN) 1 MG tablet Take 1 tablet (1 mg total) by mouth every hour as needed for anxiety, seizure or sleep (distress). 04/02/22   Quincy Simmonds, MD  ondansetron (ZOFRAN-ODT) 4 MG disintegrating tablet Take 1 tablet (4 mg total) by mouth every 6 (six) hours as needed for nausea. 04/02/22   Quincy Simmonds, MD  tamsulosin (FLOMAX) 0.4 MG CAPS capsule TAKE TWO CAPSULES BY MOUTH DAILY Patient taking differently: Take 0.8 mg by mouth daily. 10/02/21   [name], MD    Physical Exam: Vitals:   04/04/23 1845 04/04/23 1900 04/04/23 2045 04/04/23 2317  BP: 130/75 137/64 (!) 166/76 (!) 188/89  Pulse:   87 (!) 103  Resp: (!) 25 (!) Temp:      SpO2:   99% 100%   Physical Examination: General appearance - chronically ill appearing Chest - clear to auscultation, no wheezes, rales or rhonchi, symmetric air entry Heart - normal rate, regular rhythm, normal S1, S2, no murmurs, rubs, clicks or gallops Abdomen - soft, nontender, nondistended, no masses or organomegaly Extremities -no peripheral edema  Data Reviewed: Results for orders placed or performed during the hospital encounter of 04/04/23 (from the past 24 hour(s))  Basic metabolic panel     Status: Abnormal   Collection Time: 04/04/23  4:31 PM  Result Value Ref Range   Sodium 135 135 - 145 mmol/L   Potassium 5.8 (H) 3.5 - 5.1 mmol/L   Chloride 109  98 - 111 mmol/L   CO2 15 (L) 22 - 32 mmol/L   Glucose, Bld 108 (H) 70 - 99 mg/dL   BUN 56 (H) 8 - 23 mg/dL   Creatinine, Ser 5.40 (H) 0.61 - 1.24 mg/dL   Calcium 8.4 (L) 8.9 - 10.3 mg/dL   GFR, Estimated 7 (L) >60 mL/min   Anion gap 11 5 - 15  Magnesium     Status: None   Collection Time: 04/04/23  6:26 PM  Result Value Ref Range   Magnesium 2.3 1.7 - 2.4 mg/dL  CBC with Differential     Status: Abnormal   Collection Time: 04/04/23  6:26 PM  Result Value Ref Range   WBC 10.9 (H) 4.0 - 10.5 K/uL   RBC 2.56 (L) 4.22 - 5.81  MIL/uL   Hemoglobin 8.1 (L) 13.0 - 17.0 g/dL   HCT 95.6 (L) 21.3 - 08.6 %   MCV 101.6 (H) 80.0 - 100.0 fL   MCH 31.6 26.0 - 34.0 pg   MCHC 31.2 30.0 - 36.0 g/dL   RDW 57.8 46.9 - 62.9 %   Platelets 311 150 - 400 K/uL   nRBC 0.0 0.0 - 0.2 %   Neutrophils Relative % 74 %   Neutro Abs 8.2 (H) 1.7 - 7.7 K/uL   Lymphocytes Relative 17 %   Lymphs Abs 1.9 0.7 - 4.0 K/uL   Monocytes Relative 5 %   Monocytes Absolute 0.5 0.1 - 1.0 K/uL   Eosinophils Relative 2 %   Eosinophils Absolute 0.2 0.0 - 0.5 K/uL   Basophils Relative 1 %   Basophils Absolute 0.1 0.0 - 0.1 K/uL   Immature Granulocytes 1 %   Abs Immature Granulocytes 0.07 0.00 - 0.07 K/uL  I-stat chem 8, ED (not at [facility], [facility] or [facility])     Status: Abnormal   Collection Time: 04/04/23  6:58 PM  Result Value Ref Range   Sodium 135 135 - 145 mmol/L   Potassium 5.7 (H) 3.5 - 5.1 mmol/L   Chloride 114 (H) 98 - 111 mmol/L   BUN 60 (H) 8 - 23 mg/dL   Creatinine, Ser 5.28 (H) 0.61 - 1.24 mg/dL   Glucose, Bld 413 (H) 70 - 99 mg/dL   Calcium, Ion 2.44 (L) 1.15 - 1.40 mmol/L   TCO2 16 (L) 22 - 32 mmol/L   Hemoglobin 9.2 (L) 13.0 - 17.0 g/dL   HCT 01.0 (L) 27.2 - 53.6 %   US Renal  Result Date: 04/04/2023 CLINICAL DATA:  Acute kidney injury EXAM: RENAL / URINARY TRACT ULTRASOUND COMPLETE COMPARISON:  03/30/2022 FINDINGS: Right Kidney: There is progressive, marked renal cortical hyperechogenicity which limits  evaluation and precludes exact measurement of a renal size, though this appears grossly unchanged from prior examination. Mild superimposed renal cortical thinning again noted. No hydronephrosis. Numerous simple cortical cysts are again seen scattered throughout the right kidney. No solid intrarenal mass or calcification noted. Left Kidney: Similar to the right kidney, there is progressive renal cortical hyperechogenicity in keeping with underlying medical renal disease. Hyperechogenicity obscures the exact margins of the right kidney and precludes exact measurement though the kidney appears grossly unchanged in size. Mild cortical thinning again noted. No hydronephrosis. Multiple simple cortical cysts again identified. No solid intrarenal mass or calcification seen. Bladder: Appears normal for degree of bladder distention. Other: None. IMPRESSION: 1. Progressive renal cortical hyperechogenicity with mild renal cortical thinning in keeping with underlying medical renal disease. No hydronephrosis. 2. Multiple simple cortical cysts bilaterally for which no follow-up imaging is recommended. Electronically Signed   By: Helyn Numbers M.D.   On: 04/04/2023 21:48   EKG-sinus tach, LVH Assessment and Plan: * Hyperkalemia Will give Kayexalate and repeat his labs in the morning. Can certainly consider Kayexalate every other day to keep his potassium in line. We did discuss how chronic renal failure and nondialysis will eventually go.  Atrial fibrillation Rate controlled On Eliquis per cardiology note please see outpatient visit from 4/19.  Cerebrovascular accident (CVA) due to occlusion of left posterior cerebral artery No new deficits Has resumed Eliquis per cardiology  Essential hypertension Currently not on any antihypertensives As needed Lopressor written  CKD (chronic kidney disease), stage V Stage V Patient has been offered and declined dialysis on multiple occasions including this admission. The  patient is on hospice care due to this and wants to remain there.      Advance Care Planning:   Code Status: DNR confirmed with patient and family  Consults: None  Family Communication: Niece at bedside  Severity of Illness: The appropriate patient status for this patient is OBSERVATION. Observation status is judged to be reasonable and necessary in order to provide the required intensity of service to ensure the patient's safety. The patient's presenting symptoms, physical exam findings, and initial radiographic and laboratory data in the context of their medical condition is felt to place them at decreased risk for further clinical deterioration. Furthermore, it is anticipated that the patient will be medically stable for discharge from the hospital within 2 midnights of admission.   Author: Reva Bores, MD 04/05/2023 12:15 AM  For on call review [email or web address].

## 2023-04-05 NOTE — Assessment & Plan Note (Signed)
Currently not on any antihypertensives As needed Lopressor written

## 2023-04-05 NOTE — Assessment & Plan Note (Signed)
Rate controlled On Eliquis per cardiology note please see outpatient visit from 4/19.

## 2023-04-05 NOTE — Progress Notes (Signed)
[facility] 2W15 [facility]) hospitalized hospice patient visit  Mr. Klima is a current [facility] hospice patient with a terminal diagnosis of hypertensive chronic kidney disease with stage 5 chronic kidney disease. He presented to ED after recommendation of cardiology PA due to elevated Potassium level and admitted on 4.20 for same. [facility] was notified of admission after the fact. Per Dr. Gordy Savers with [facility] this is a related hospice admission.    Mr. Bekele has no complaints today. He reports he is here because of his elevated potassium and ready to go home when able. He is inpatient appropriate due to need for treatment and follow up labs in treatment of high potassium.   Vital Signs- 98.6/101/14    168/82     100% room air Intake/Output- 500/0 Abnormal labs- K+ 5.3, BUN 60, Creatinine 7.52, Ca+ 8.4, GFR 7, WBC 10.9, RBC 2.56, Hgb 9.2, Hct 27 Diagnostics-   RENAL / URINARY TRACT ULTRASOUND COMPLETE IMPRESSION: 1. Progressive renal cortical hyperechogenicity with mild renal cortical thinning in keeping with underlying medical renal disease. No hydronephrosis. 2. Multiple simple cortical cysts bilaterally for which no follow-up imaging is recommended.  Electronically Signed   By: Helyn Numbers M.D.   On: 04/04/2023 21:48  IV/PRN Meds- Kayexalate 30g po once, Lokelma 10g TID po, LR  IV once  Problem List-   Hyperkalemia Will give Kayexalate and repeat his labs in the morning. Can certainly consider Kayexalate every other day to keep his potassium in line. We did discuss how chronic renal failure and nondialysis will eventually go.   Atrial fibrillation Rate controlled On Eliquis per cardiology note please see outpatient visit from 4/19.   Cerebrovascular accident (CVA) due to occlusion of left posterior cerebral artery No new deficits Has resumed Eliquis per cardiology   Essential hypertension Currently not on any antihypertensives As needed Lopressor written   CKD  (chronic kidney disease), stage V Stage V Patient has been offered and declined dialysis on multiple occasions including this admission. The patient is on hospice care due to this and wants to remain there.   Discharge Planning- Ongoing, likely back to home today Family Contact- None IDT- Updated Goals of care - DNR If EMS transport is required please use [facility] as we contract this service for our patients.  Thea Gist, Charity fundraiser, [facility] Liaison [phone]

## 2023-04-05 NOTE — Hospital Course (Signed)
Patient is an 82 year old with HTN, HLD, PVD, CVA, T2DM and hospice care for end-stage renal disease who declines dialysis.  Patient saw cardiology for clearance for removal of the remaining teeth in his mouth.  As part of that workup they did get labs and noted him to have an elevated potassium of 6.7.  He was advised to come to the ED.  His nieces who are his primary caregivers asked for admission.  On evaluation in the ED his potassium was down to 5.8, and his creatinine was elevated at 7.47.  His BUN was 56 this is previously been in the 110 range.  On evaluation the patient is still adamant about his desires to not do dialysis and to remain under hospice care.

## 2023-04-05 NOTE — Progress Notes (Signed)
NOTIFIED HOSITALIST ON CALL FOR PATIENT'S POTASSIUM LEVEL 6.7. AWAITING FOR RESPONSE FROM MD

## 2023-04-05 NOTE — H&P (Deleted)
Physician Discharge Summary  Derrick Hendrix. ZOX:[id number] DOB: January 16, 1941 DOA: 04/04/2023  PCP: Derrick Slater, DO  Admit date: 04/04/2023 Discharge date: 04/05/2023  Admitted From: Home hospice  Discharge disposition: Home hospice   Recommendations for Outpatient Follow-Up:   Follow-up with hospice care provider.  If appropriate consider checking blood work in 3 to 4 days and treat for hyperkalemia with [facility] as outpatient if elevated.  Discharge Diagnosis:   Principal Problem:   Hyperkalemia Active Problems:   CKD (chronic kidney disease), stage V   Essential hypertension   Cerebrovascular accident (CVA) due to occlusion of left posterior cerebral artery   Atrial fibrillation   Discharge Condition: Improved.  Diet recommendation:   Low potassium diet.  Wound care: None.  Code status: DNR   History of Present Illness:   Derrick Hendrix. is a 82 y.o. male with medical history of HTN, hyperlipidemia, peripheral vascular disease, CVA, type 2 diabetes and on hospice care for end-stage renal  disease not on dialysis.  Patient had seen cardiology for clearance to remove previously remaining teeth in the mouth and as a part of the workup patient was noted to have potassium of 6.7.  Was asked to come to the ED for in the ED potassium was 5.8.  Creatinine was elevated at 7.4.  Patient was then admitted hospital for management of hyperkalemia.    Hospital Course:   Following conditions were addressed during hospitalization as listed below,   Hyperkalemia Received Kayexalate.  Potassium this morning at 5.3.  Received Lokelma.  Patient has declined hemodialysis in the past.  Currently under hospice care.   Atrial fibrillation Controlled.  On Eliquis   Cerebrovascular accident (CVA) due to occlusion of left posterior cerebral artery On Eliquis.  No new deficits.   Essential hypertension Not on any antihypertensives.   CKD (chronic kidney disease), stage V Had  declined hemodialysis in the past and is on hospice care  Disposition.  At this time, patient is stable for disposition back to home hospice.  Medical Consultants:   None.  Procedures:    None Subjective:   Today, patient was seen and examined at bedside.  He states that he wants to go home.  Denies any nausea vomiting fever chills or rigor.  Discharge Exam:   Vitals:   04/05/23 0419 04/05/23 0809  BP: (!) 192/91 (!) 168/82  Pulse: (!) 107 (!) 101  Resp: 18 14  Temp: 98.1 F (36.7 C) 98.6 F (37 C)  SpO2: 100% 100%   Vitals:   04/05/23 0200 04/05/23 0300 04/05/23 0419 04/05/23 0809  BP: (!) 182/86 (!) 173/87 (!) 192/91 (!) 168/82  Pulse: 86 98 (!) 107 (!) 101  Resp: 10 17 18 14   Temp:   98.1 F (36.7 C) 98.6 F (37 C)  TempSrc:   Oral Oral  SpO2: 100% 100% 100% 100%  Weight:   74.4 kg   Height:   6\' 3"  (1.905 m)    Body mass index is 20.5 kg/m.   General: Alert awake, not in obvious distress, elderly male, appears chronically ill. HENT: pupils equally reacting to light,  No scleral pallor or icterus noted. Oral mucosa is moist.  Chest:    Diminished breath sounds bilaterally. No crackles or wheezes.  CVS: S1 &S2 heard. No murmur.  Regular rate and rhythm. Abdomen: Soft, nontender, mildly distended abdomen bowel sounds are heard.   Extremities: No cyanosis, clubbing or edema.  Peripheral pulses are palpable. Psych: Alert, awake and Communicative. CNS:  No cranial nerve deficits.  Power equal in all extremities.   Skin: Warm and dry.  No rashes noted.  The results of significant diagnostics from this hospitalization (including imaging, microbiology, ancillary and laboratory) are listed below for reference.     Diagnostic Studies:   US Renal  Result Date: 04/04/2023 CLINICAL DATA:  Acute kidney injury EXAM: RENAL / URINARY TRACT ULTRASOUND COMPLETE COMPARISON:  03/30/2022 FINDINGS: Right Kidney: There is progressive, marked renal cortical hyperechogenicity  which limits evaluation and precludes exact measurement of a renal size, though this appears grossly unchanged from prior examination. Mild superimposed renal cortical thinning again noted. No hydronephrosis. Numerous simple cortical cysts are again seen scattered throughout the right kidney. No solid intrarenal mass or calcification noted. Left Kidney: Similar to the right kidney, there is progressive renal cortical hyperechogenicity in keeping with underlying medical renal disease. Hyperechogenicity obscures the exact margins of the right kidney and precludes exact measurement though the kidney appears grossly unchanged in size. Mild cortical thinning again noted. No hydronephrosis. Multiple simple cortical cysts again identified. No solid intrarenal mass or calcification seen. Bladder: Appears normal for degree of bladder distention. Other: None. IMPRESSION: 1. Progressive renal cortical hyperechogenicity with mild renal cortical thinning in keeping with underlying medical renal disease. No hydronephrosis. 2. Multiple simple cortical cysts bilaterally for which no follow-up imaging is recommended. Electronically Signed   By: Helyn Numbers M.D.   On: 04/04/2023 21:48     Labs:   Basic Metabolic Panel: Recent Labs  Lab 04/03/23 0959 04/04/23 1631 04/04/23 1826 04/04/23 1858 04/05/23 0436 04/05/23 0637 04/05/23 1152  NA 138 135  --  135 137 136  --   K 6.7* 5.8*  --  5.7* 6.7* 5.7* 5.3*  CL 108* 109  --  114* 111 110  --   CO2 15* 15*  --   --  9* 14*  --   GLUCOSE 93 108*  --  225* 115* 85  --   BUN 59* 56*  --  60* 63* 60*  --   CREATININE 7.01* 7.47*  --  9.00* 7.71* 7.52*  --   CALCIUM 9.0 8.4*  --   --  8.4* 8.4*  --   MG  --   --  2.3  --   --   --   --    GFR Estimated Creatinine Clearance: 7.7 mL/min (A) (by C-G formula based on SCr of 7.52 mg/dL (H)). Liver Function Tests: No results for input(s): "AST", "ALT", "ALKPHOS", "BILITOT", "PROT", "ALBUMIN" in the last 168 hours. No  results for input(s): "LIPASE", "AMYLASE" in the last 168 hours. No results for input(s): "AMMONIA" in the last 168 hours. Coagulation profile No results for input(s): "INR", "PROTIME" in the last 168 hours.  CBC: Recent Labs  Lab 04/03/23 0959 04/04/23 1826 04/04/23 1858  WBC 6.0 10.9*  --   NEUTROABS  --  8.2*  --   HGB 8.7* 8.1* 9.2*  HCT 27.4* 26.0* 27.0*  MCV 96 101.6*  --   PLT 361 311  --    Cardiac Enzymes: No results for input(s): "CKTOTAL", "CKMB", "CKMBINDEX", "TROPONINI" in the last 168 hours. BNP: Invalid input(s): "POCBNP" CBG: No results for input(s): "GLUCAP" in the last 168 hours. D-Dimer No results for input(s): "DDIMER" in the last 72 hours. Hgb A1c No results for input(s): "HGBA1C" in the last 72 hours. Lipid Profile No results for input(s): "CHOL", "HDL", "LDLCALC", "TRIG", "CHOLHDL", "LDLDIRECT" in the last 72 hours. Thyroid function studies  No results for input(s): "TSH", "T4TOTAL", "T3FREE", "THYROIDAB" in the last 72 hours.  Invalid input(s): "FREET3" Anemia work up No results for input(s): "VITAMINB12", "FOLATE", "FERRITIN", "TIBC", "IRON", "RETICCTPCT" in the last 72 hours. Microbiology No results found for this or any previous visit (from the past 240 hour(s)).   Discharge Instructions:   Discharge Instructions     Diet - low sodium heart healthy   Complete by: As directed    Discharge instructions   Complete by: As directed    Follow-up with your hospice care provider as outpatient.   Discharge wound care:   Complete by: As directed    Wound care to right ankle wound, partial thickness:  Cleanse with NS, pat dry. Apply a thin layer of MediHoney to wound, top with dry gauze and secure with silicone foam.   Increase activity slowly   Complete by: As directed       Allergies as of 04/05/2023       Reactions   Aspirin Other (See Comments)   Nosebleeds         Medication List     STOP taking these medications    capsaicin  0.025 % cream Commonly known as: ZOSTRIX       TAKE these medications    amLODipine 5 MG tablet Commonly known as: NORVASC Take 5 mg by mouth 2 (two) times daily.   antiseptic oral rinse Liqd Apply 15 mLs topically 2 (two) times daily.   apixaban 2.5 MG Tabs tablet Commonly known as: ELIQUIS Take 1 tablet (2.5 mg total) by mouth 2 (two) times daily.   Banophen 25 mg capsule Generic drug: diphenhydrAMINE Take 25 mg by mouth 3 (three) times daily. What changed: Another medication with the same name was removed. Continue taking this medication, and follow the directions you see here.   camphor-menthol lotion Commonly known as: SARNA Apply topically as needed for itching. What changed: how much to take   finasteride 5 MG tablet Commonly known as: PROSCAR TAKE ONE TABLET BY MOUTH DAILY   glycopyrrolate 1 MG tablet Commonly known as: ROBINUL Take 1 tablet (1 mg total) by mouth every 4 (four) hours as needed (excessive secretions).   haloperidol 2 MG tablet Commonly known as: HALDOL Take 1 tablet (2 mg total) by mouth every 6 (six) hours as needed for agitation (or delirium).   hydrALAZINE 10 MG tablet Commonly known as: APRESOLINE Take 15 mg by mouth 3 (three) times daily.   HYDROmorphone 1 MG/ML injection Commonly known as: DILAUDID Inject 0.5-1 mLs (0.5-1 mg total) into the vein every 2 (two) hours as needed for severe pain or moderate pain (dyspnea, increased work of breathing, RR >25, distress).   LORazepam 1 MG tablet Commonly known as: ATIVAN Take 1 tablet (1 mg total) by mouth every hour as needed for anxiety, seizure or sleep (distress). What changed: when to take this   ondansetron 4 MG disintegrating tablet Commonly known as: ZOFRAN-ODT Take 1 tablet (4 mg total) by mouth every 6 (six) hours as needed for nausea.   tamsulosin 0.4 MG Caps capsule Commonly known as: FLOMAX TAKE TWO CAPSULES BY MOUTH DAILY   traZODone 50 MG tablet Commonly known as:  DESYREL Take 50 mg by mouth at bedtime.               Discharge Care Instructions  (From admission, onward)           Start     Ordered   04/05/23 0000  Discharge  wound care:       Comments: Wound care to right ankle wound, partial thickness:  Cleanse with NS, pat dry. Apply a thin layer of MediHoney to wound, top with dry gauze and secure with silicone foam.   04/05/23 1433             Time coordinating discharge: 39 minutes  Signed:  Tiwan Schnitker  [facility] 04/05/2023, 2:34 PM

## 2023-04-05 NOTE — Discharge Summary (Signed)
Physician Discharge Summary  Derrick Hendrix. WUJ:[id number] DOB: Oct 01, 1941 DOA: 04/04/2023  PCP: Modena Slater, DO  Admit date: 04/04/2023 Discharge date: 04/05/2023  Admitted From: Home hospice  Discharge disposition: Home hospice   Recommendations for Outpatient Follow-Up:   Follow-up with hospice care provider.  If appropriate consider checking blood work in 3 to 4 days and treat for hyperkalemia with [facility] as outpatient if elevated.  Discharge Diagnosis:   Principal Problem:   Hyperkalemia Active Problems:   CKD (chronic kidney disease), stage V   Essential hypertension   Cerebrovascular accident (CVA) due to occlusion of left posterior cerebral artery   Atrial fibrillation   Discharge Condition: Improved.  Diet recommendation:   Low potassium diet.  Wound care: None.  Code status: DNR   History of Present Illness:   Derrick Hendrix. is a 82 y.o. male with medical history of HTN, hyperlipidemia, peripheral vascular disease, CVA, type 2 diabetes and on hospice care for end-stage renal  disease not on dialysis.  Patient had seen cardiology for clearance to remove previously remaining teeth in the mouth and as a part of the workup patient was noted to have potassium of 6.7.  Was asked to come to the ED for in the ED potassium was 5.8.  Creatinine was elevated at 7.4.  Patient was then admitted hospital for management of hyperkalemia.    Hospital Course:   Following conditions were addressed during hospitalization as listed below,   Hyperkalemia Received Kayexalate.  Potassium this morning at 5.3.  Received Lokelma.  Patient has declined hemodialysis in the past.  Currently under hospice care.   Atrial fibrillation Controlled.  On Eliquis   Cerebrovascular accident (CVA) due to occlusion of left posterior cerebral artery On Eliquis.  No new deficits.   Essential hypertension Not on any antihypertensives.   CKD (chronic kidney disease), stage V Had  declined hemodialysis in the past and is on hospice care  Disposition.  At this time, patient is stable for disposition back to home hospice.  Medical Consultants:   None.  Procedures:    None Subjective:   Today, patient was seen and examined at bedside.  He states that he wants to go home.  Denies any nausea vomiting fever chills or rigor.  Discharge Exam:   Vitals:   04/05/23 0419 04/05/23 0809  BP: (!) 192/91 (!) 168/82  Pulse: (!) 107 (!) 101  Resp: 18 14  Temp: 98.1 F (36.7 C) 98.6 F (37 C)  SpO2: 100% 100%   Vitals:   04/05/23 0200 04/05/23 0300 04/05/23 0419 04/05/23 0809  BP: (!) 182/86 (!) 173/87 (!) 192/91 (!) 168/82  Pulse: 86 98 (!) 107 (!) 101  Resp: 10 17 18 14   Temp:   98.1 F (36.7 C) 98.6 F (37 C)  TempSrc:   Oral Oral  SpO2: 100% 100% 100% 100%  Weight:   74.4 kg   Height:   6\' 3"  (1.905 m)    Body mass index is 20.5 kg/m.   General: Alert awake, not in obvious distress, elderly male, appears chronically ill. HENT: pupils equally reacting to light,  No scleral pallor or icterus noted. Oral mucosa is moist.  Chest:    Diminished breath sounds bilaterally. No crackles or wheezes.  CVS: S1 &S2 heard. No murmur.  Regular rate and rhythm. Abdomen: Soft, nontender, mildly distended abdomen bowel sounds are heard.   Extremities: No cyanosis, clubbing or edema.  Peripheral pulses are palpable. Psych: Alert, awake and Communicative. CNS:  No cranial nerve deficits.  Power equal in all extremities.   Skin: Warm and dry.  No rashes noted.  The results of significant diagnostics from this hospitalization (including imaging, microbiology, ancillary and laboratory) are listed below for reference.     Diagnostic Studies:   US Renal  Result Date: 04/04/2023 CLINICAL DATA:  Acute kidney injury EXAM: RENAL / URINARY TRACT ULTRASOUND COMPLETE COMPARISON:  03/30/2022 FINDINGS: Right Kidney: There is progressive, marked renal cortical hyperechogenicity  which limits evaluation and precludes exact measurement of a renal size, though this appears grossly unchanged from prior examination. Mild superimposed renal cortical thinning again noted. No hydronephrosis. Numerous simple cortical cysts are again seen scattered throughout the right kidney. No solid intrarenal mass or calcification noted. Left Kidney: Similar to the right kidney, there is progressive renal cortical hyperechogenicity in keeping with underlying medical renal disease. Hyperechogenicity obscures the exact margins of the right kidney and precludes exact measurement though the kidney appears grossly unchanged in size. Mild cortical thinning again noted. No hydronephrosis. Multiple simple cortical cysts again identified. No solid intrarenal mass or calcification seen. Bladder: Appears normal for degree of bladder distention. Other: None. IMPRESSION: 1. Progressive renal cortical hyperechogenicity with mild renal cortical thinning in keeping with underlying medical renal disease. No hydronephrosis. 2. Multiple simple cortical cysts bilaterally for which no follow-up imaging is recommended. Electronically Signed   By: Helyn Numbers M.D.   On: 04/04/2023 21:48     Labs:   Basic Metabolic Panel: Recent Labs  Lab 04/03/23 0959 04/04/23 1631 04/04/23 1826 04/04/23 1858 04/05/23 0436 04/05/23 0637 04/05/23 1152  NA 138 135  --  135 137 136  --   K 6.7* 5.8*  --  5.7* 6.7* 5.7* 5.3*  CL 108* 109  --  114* 111 110  --   CO2 15* 15*  --   --  9* 14*  --   GLUCOSE 93 108*  --  225* 115* 85  --   BUN 59* 56*  --  60* 63* 60*  --   CREATININE 7.01* 7.47*  --  9.00* 7.71* 7.52*  --   CALCIUM 9.0 8.4*  --   --  8.4* 8.4*  --   MG  --   --  2.3  --   --   --   --     GFR Estimated Creatinine Clearance: 7.7 mL/min (A) (by C-G formula based on SCr of 7.52 mg/dL (H)). Liver Function Tests: No results for input(s): "AST", "ALT", "ALKPHOS", "BILITOT", "PROT", "ALBUMIN" in the last 168  hours. No results for input(s): "LIPASE", "AMYLASE" in the last 168 hours. No results for input(s): "AMMONIA" in the last 168 hours. Coagulation profile No results for input(s): "INR", "PROTIME" in the last 168 hours.  CBC: Recent Labs  Lab 04/03/23 0959 04/04/23 1826 04/04/23 1858  WBC 6.0 10.9*  --   NEUTROABS  --  8.2*  --   HGB 8.7* 8.1* 9.2*  HCT 27.4* 26.0* 27.0*  MCV 96 101.6*  --   PLT 361 311  --     Cardiac Enzymes: No results for input(s): "CKTOTAL", "CKMB", "CKMBINDEX", "TROPONINI" in the last 168 hours. BNP: Invalid input(s): "POCBNP" CBG: No results for input(s): "GLUCAP" in the last 168 hours. D-Dimer No results for input(s): "DDIMER" in the last 72 hours. Hgb A1c No results for input(s): "HGBA1C" in the last 72 hours. Lipid Profile No results for input(s): "CHOL", "HDL", "LDLCALC", "TRIG", "CHOLHDL", "LDLDIRECT" in the last 72 hours. Thyroid  function studies No results for input(s): "TSH", "T4TOTAL", "T3FREE", "THYROIDAB" in the last 72 hours.  Invalid input(s): "FREET3" Anemia work up No results for input(s): "VITAMINB12", "FOLATE", "FERRITIN", "TIBC", "IRON", "RETICCTPCT" in the last 72 hours. Microbiology No results found for this or any previous visit (from the past 240 hour(s)).   Discharge Instructions:   Discharge Instructions     Diet - low sodium heart healthy   Complete by: As directed    Discharge instructions   Complete by: As directed    Follow-up with your hospice care provider as outpatient.   Discharge wound care:   Complete by: As directed    Wound care to right ankle wound, partial thickness:  Cleanse with NS, pat dry. Apply a thin layer of MediHoney to wound, top with dry gauze and secure with silicone foam.   Increase activity slowly   Complete by: As directed       Allergies as of 04/05/2023       Reactions   Aspirin Other (See Comments)   Nosebleeds         Medication List     STOP taking these medications     capsaicin 0.025 % cream Commonly known as: ZOSTRIX       TAKE these medications    amLODipine 5 MG tablet Commonly known as: NORVASC Take 5 mg by mouth 2 (two) times daily.   antiseptic oral rinse Liqd Apply 15 mLs topically 2 (two) times daily.   apixaban 2.5 MG Tabs tablet Commonly known as: ELIQUIS Take 1 tablet (2.5 mg total) by mouth 2 (two) times daily.   Banophen 25 mg capsule Generic drug: diphenhydrAMINE Take 25 mg by mouth 3 (three) times daily. What changed: Another medication with the same name was removed. Continue taking this medication, and follow the directions you see here.   camphor-menthol lotion Commonly known as: SARNA Apply topically as needed for itching. What changed: how much to take   finasteride 5 MG tablet Commonly known as: PROSCAR TAKE ONE TABLET BY MOUTH DAILY   glycopyrrolate 1 MG tablet Commonly known as: ROBINUL Take 1 tablet (1 mg total) by mouth every 4 (four) hours as needed (excessive secretions).   haloperidol 2 MG tablet Commonly known as: HALDOL Take 1 tablet (2 mg total) by mouth every 6 (six) hours as needed for agitation (or delirium).   hydrALAZINE 10 MG tablet Commonly known as: APRESOLINE Take 15 mg by mouth 3 (three) times daily.   HYDROmorphone 1 MG/ML injection Commonly known as: DILAUDID Inject 0.5-1 mLs (0.5-1 mg total) into the vein every 2 (two) hours as needed for severe pain or moderate pain (dyspnea, increased work of breathing, RR >25, distress).   LORazepam 1 MG tablet Commonly known as: ATIVAN Take 1 tablet (1 mg total) by mouth every hour as needed for anxiety, seizure or sleep (distress). What changed: when to take this   ondansetron 4 MG disintegrating tablet Commonly known as: ZOFRAN-ODT Take 1 tablet (4 mg total) by mouth every 6 (six) hours as needed for nausea.   tamsulosin 0.4 MG Caps capsule Commonly known as: FLOMAX TAKE TWO CAPSULES BY MOUTH DAILY   traZODone 50 MG tablet Commonly  known as: DESYREL Take 50 mg by mouth at bedtime.               Discharge Care Instructions  (From admission, onward)           Start     Ordered   04/05/23 0000  Discharge wound care:       Comments: Wound care to right ankle wound, partial thickness:  Cleanse with NS, pat dry. Apply a thin layer of MediHoney to wound, top with dry gauze and secure with silicone foam.   04/05/23 1433             Time coordinating discharge: 39 minutes  Signed:  Jocelyne Reinertsen  [facility] 04/05/2023, 4:51 PM

## 2023-04-05 NOTE — TOC Transition Note (Signed)
Transition of Care [facility]) - CM/SW Discharge Note   Patient Details  Name: Derrick Hendrix. MRN: [id number] Date of Birth: 1941-04-26  [facility] CM/SW Contact:  Lawerance Sabal, RN Phone Number: 04/05/2023, 12:32 PM   Clinical Narrative:      Spoke to patient's niece over the phone.  She states that he is active w [facility] for home hospice.  Informed her that if his labs come back ok today, we are looking at discharging him.  She states either she or her sister will be able to come and bring him home. Notified Misty w [facility] of plans.        Patient Goals and CMS Choice      Discharge Placement                         Discharge Plan and Services Additional resources added to the After Visit Summary for                                       Social Determinants of Health (SDOH) Interventions SDOH Screenings   Food Insecurity: No Food Insecurity (04/04/2023)  Housing: Low Risk  (04/04/2023)  Transportation Needs: No Transportation Needs (04/04/2023)  Utilities: Not At Risk (04/04/2023)  Depression (PHQ2-9): Low Risk  (03/25/2022)  Financial Resource Strain: Low Risk  (03/05/2020)  Social Connections: Unknown (06/13/2021)  Tobacco Use: Medium Risk (04/04/2023)     Readmission Risk Interventions     No data to display

## 2023-04-05 NOTE — Assessment & Plan Note (Signed)
Will give Kayexalate and repeat his labs in the morning. Can certainly consider Kayexalate every other day to keep his potassium in line. We did discuss how chronic renal failure and nondialysis will eventually go.

## 2023-04-05 NOTE — Plan of Care (Signed)

## 2023-04-05 NOTE — Consult Note (Signed)
WOC Nurse Consult Note: Reason for Consult:right ankle wound, chronic, nonhealing Wound type:venous insufficiency Pressure Injury POA: N/A Measurement:Measurements to be obtained by Bedside RN to day with application of next dressing and documented on Nursing Flow Sheet Wound bed:red, pink, with some yellow slough Drainage (amount, consistency, odor) none Periwound:intact Dressing procedure/placement/frequency:I have provided Nursing with conservative care guidance for the care of this wound using a daily cleanse and dry followed by application of leptospermum Manuka honey (MediHoney). This will be topped with a dry dressing and secured with a silicone foam. The feet are to be placed into Prevalon boots. The patient is to be turned from side to side and a sacral prophylactic foam dressing placed for PI prevention in the sacral area.   WOC nursing team will not follow, but will remain available to this patient, the nursing and medical teams.  Please re-consult if needed.  Thank you for inviting [place] to participate in this patient's Plan of Care.  Ladona Mow, MSN, RN, CNS, GNP, Leda Min, [employer], [employer] phone:  [phone]

## 2023-04-05 NOTE — Assessment & Plan Note (Signed)
No new deficits Has resumed Eliquis per cardiology

## 2023-04-05 NOTE — Assessment & Plan Note (Addendum)
Stage V Patient has been offered and declined dialysis on multiple occasions including this admission. The patient is on hospice care due to this and wants to remain there.

## 2023-04-05 NOTE — ED Notes (Signed)
ED TO INPATIENT HANDOFF REPORT  ED Nurse Name and Phone #: Milagros Loll [phone]  S Name/Age/Gender Derrick Hendrix. 82 y.o. male Room/Bed: 003C/003C  Code Status   Code Status: DNR  Home/SNF/Other Home Patient oriented to: situation Is this baseline? Yes   Triage Complete: Triage complete  Chief Complaint Hyperkalemia [E87.5]  Triage Note Patient here to have potassium rechecked. Drawn yesterday at cardiology office and it has come back 6.7. Three days ago 4. Patient had to have clearance for dental surgery, no complaints   Allergies Allergies  Allergen Reactions   Aspirin Other (See Comments)    Nosebleeds     Level of Care/Admitting Diagnosis ED Disposition     ED Disposition  Admit   Condition  --   Comment  Hospital Area: [facility] [[id number]]  Level of Care: Med-Surg [16]  May place patient in observation at [facility] or [facility] if equivalent level of care is available:: Yes  Covid Evaluation: Asymptomatic - no recent exposure (last 10 days) testing not required  Diagnosis: Hyperkalemia [960454]  Admitting Physician: Samara Snide  Attending Physician: Samara Snide          B Medical/Surgery History Past Medical History:  Diagnosis Date   Abscess of foot without toes, left 08/28/2017   Chronic kidney disease (CKD), stage III (moderate)    Constipation 11/07/2014   Daily headache    "lately" (04/17/2016)   Depression    Dizziness 03/07/2016   Headache 06/26/2014   Heart murmur    Hyperkalemia 10/05/2018   Hyperlipemia 11/11/2016   Hypertension    Neuropathic pain 02/06/2015   Osteomyelitis    PAD (peripheral artery disease)    Paronychia of great toe, right    Preop cardiovascular exam    Right foot pain 02/09/2017   Stable angina 06/27/2014   Stroke 03/2016   hx of PAD; j"ust lots of headaches since" (04/17/2016)   Vitamin B12 deficiency 06/27/2014   Weight loss, unintentional 06/27/2014   Past Surgical History:   Procedure Laterality Date   ABDOMINAL AORTOGRAM W/LOWER EXTREMITY N/A 03/02/2017   Procedure: Abdominal Aortogram w/Lower Extremity;  Surgeon: Fransisco Hertz, MD;  Location: [facility] INVASIVE CV LAB;  Service: Cardiovascular;  Laterality: N/A;   EP IMPLANTABLE DEVICE N/A 03/04/2016   Procedure: Loop Recorder Insertion;  Surgeon: Hillis Range, MD;  Location: [facility] INVASIVE CV LAB;  Service: Cardiovascular;  Laterality: N/A;   implantable loop recorder removal  12/19/2020   MDT Reveal LINQ removed by Dr  Johney Frame   PERIPHERAL VASCULAR INTERVENTION Right 03/06/2017   Procedure: Peripheral Vascular Intervention;  Surgeon: Sherren Kerns, MD;  Location: [facility] INVASIVE CV LAB;  Service: Cardiovascular;  Laterality: Right;  SFA     A IV Location/Drains/Wounds Patient Lines/Drains/Airways Status     Active Line/Drains/Airways     Name Placement date Placement time Site Days   Peripheral IV 04/04/23 20 G Anterior;Left Hand 04/04/23  2102  Hand  1   Incision (Closed) 03/05/16 Chest Left 03/05/16  0200  -- 2587   Wound / Incision (Open or Dehisced) 03/03/17  Toe (Comment  which one) Right  rt toe nail bed wound s/p toe nail removal 03/03/17  0800  Toe (Comment  which one)  2224            Intake/Output Last 24 hours  Intake/Output Summary (Last 24 hours) at 04/05/2023 0324 Last data filed at 04/04/2023 2213 Gross per 24 hour  Intake 500 ml  Output --  Net 500 ml    Labs/Imaging Results for orders placed or performed during the hospital encounter of 04/04/23 (from the past 48 hour(s))  Basic metabolic panel     Status: Abnormal   Collection Time: 04/04/23  4:31 PM  Result Value Ref Range   Sodium 135 135 - 145 mmol/L   Potassium 5.8 (H) 3.5 - 5.1 mmol/L   Chloride 109 98 - 111 mmol/L   CO2 15 (L) 22 - 32 mmol/L   Glucose, Bld 108 (H) 70 - 99 mg/dL    Comment: Glucose reference range applies only to samples taken after fasting for at least 8 hours.   BUN 56 (H) 8 - 23 mg/dL   Creatinine, Ser 4.19  (H) 0.61 - 1.24 mg/dL   Calcium 8.4 (L) 8.9 - 10.3 mg/dL   GFR, Estimated 7 (L) >60 mL/min    Comment: (NOTE) Calculated using the CKD-EPI Creatinine Equation (2021)    Anion gap 11 5 - 15    Comment: Performed at [facility], [address], [place], [place] [address]  Magnesium     Status: None   Collection Time: 04/04/23  6:26 PM  Result Value Ref Range   Magnesium 2.3 1.7 - 2.4 mg/dL    Comment: Performed at [facility], [address] [place], [place] [address]  CBC with Differential     Status: Abnormal   Collection Time: 04/04/23  6:26 PM  Result Value Ref Range   WBC 10.9 (H) 4.0 - 10.5 K/uL   RBC 2.56 (L) 4.22 - 5.81 MIL/uL   Hemoglobin 8.1 (L) 13.0 - 17.0 g/dL   HCT 11.9 (L) 41.7 - 40.8 %   MCV 101.6 (H) 80.0 - 100.0 fL   MCH 31.6 26.0 - 34.0 pg   MCHC 31.2 30.0 - 36.0 g/dL   RDW 14.4 81.8 - 56.3 %   Platelets 311 150 - 400 K/uL   nRBC 0.0 0.0 - 0.2 %   Neutrophils Relative % 74 %   Neutro Abs 8.2 (H) 1.7 - 7.7 K/uL   Lymphocytes Relative 17 %   Lymphs Abs 1.9 0.7 - 4.0 K/uL   Monocytes Relative 5 %   Monocytes Absolute 0.5 0.1 - 1.0 K/uL   Eosinophils Relative 2 %   Eosinophils Absolute 0.2 0.0 - 0.5 K/uL   Basophils Relative 1 %   Basophils Absolute 0.1 0.0 - 0.1 K/uL   Immature Granulocytes 1 %   Abs Immature Granulocytes 0.07 0.00 - 0.07 K/uL    Comment: Performed at [facility], [address], [place], [place] [address]  I-stat chem 8, ED (not at [facility], [facility] or [facility])     Status: Abnormal   Collection Time: 04/04/23  6:58 PM  Result Value Ref Range   Sodium 135 135 - 145 mmol/L   Potassium 5.7 (H) 3.5 - 5.1 mmol/L   Chloride 114 (H) 98 - 111 mmol/L   BUN 60 (H) 8 - 23 mg/dL   Creatinine, Ser 2.63 (H) 0.61 - 1.24 mg/dL   Glucose, Bld 785 (H) 70 - 99 mg/dL    Comment: Glucose reference range applies only to samples taken after fasting for at least 8 hours.   Calcium, Ion 1.10 (L) 1.15 - 1.40 mmol/L   TCO2 16 (L) 22 - 32 mmol/L    Hemoglobin 9.2 (L) 13.0 - 17.0 g/dL   HCT 88.5 (L) 02.7 - 74.1 %   US Renal  Result  Date: 04/04/2023 CLINICAL DATA:  Acute kidney injury EXAM: RENAL / URINARY TRACT ULTRASOUND COMPLETE COMPARISON:  03/30/2022 FINDINGS: Right Kidney: There is progressive, marked renal cortical hyperechogenicity which limits evaluation and precludes exact measurement of a renal size, though this appears grossly unchanged from prior examination. Mild superimposed renal cortical thinning again noted. No hydronephrosis. Numerous simple cortical cysts are again seen scattered throughout the right kidney. No solid intrarenal mass or calcification noted. Left Kidney: Similar to the right kidney, there is progressive renal cortical hyperechogenicity in keeping with underlying medical renal disease. Hyperechogenicity obscures the exact margins of the right kidney and precludes exact measurement though the kidney appears grossly unchanged in size. Mild cortical thinning again noted. No hydronephrosis. Multiple simple cortical cysts again identified. No solid intrarenal mass or calcification seen. Bladder: Appears normal for degree of bladder distention. Other: None. IMPRESSION: 1. Progressive renal cortical hyperechogenicity with mild renal cortical thinning in keeping with underlying medical renal disease. No hydronephrosis. 2. Multiple simple cortical cysts bilaterally for which no follow-up imaging is recommended. Electronically Signed   By: Helyn Numbers M.D.   On: 04/04/2023 21:48    Pending Labs Unresulted Labs (From admission, onward)     Start     Ordered   04/05/23 0500  Basic metabolic panel  Tomorrow morning,   R        04/05/23 0010   04/04/23 1826  Calcium, ionized  Once,   STAT        04/04/23 1826            Vitals/Pain Today's Vitals   04/04/23 2045 04/04/23 2317 04/05/23 0100 04/05/23 0200  BP: (!) 166/76 (!) 188/89 (!) 190/91 (!) 182/86  Pulse: 87 (!) 103 93 86  Resp: 15 18 20 10   Temp:       SpO2: 99% 100% 100% 100%  PainSc:        Isolation Precautions No active isolations  Medications Medications  ondansetron (ZOFRAN) injection 4 mg (has no administration in time range)  haloperidol (HALDOL) tablet 2 mg (has no administration in time range)  LORazepam (ATIVAN) tablet 1 mg (has no administration in time range)  glycopyrrolate (ROBINUL) tablet 1 mg (has no administration in time range)  ondansetron (ZOFRAN-ODT) disintegrating tablet 4 mg (has no administration in time range)  finasteride (PROSCAR) tablet 5 mg (has no administration in time range)  tamsulosin (FLOMAX) capsule 0.8 mg (has no administration in time range)  apixaban (ELIQUIS) tablet 2.5 mg (2.5 mg Oral Not Given 04/05/23 0218)  diphenhydrAMINE (BENADRYL) injection 12.5 mg (has no administration in time range)  fentaNYL (SUBLIMAZE) injection 12.5-50 mcg (has no administration in time range)  sodium polystyrene (KAYEXALATE) 15 GM/60ML suspension 30 g (30 g Rectal Patient Refused/Not Given 04/05/23 0318)  lactated ringers bolus 500 mL (0 mLs Intravenous Stopped 04/04/23 2213)    Mobility walks with device     Focused Assessments Hyperkalemia     R Recommendations: See Admitting Provider Note  Report given to:   Additional Notes: N/A

## 2023-04-06 ENCOUNTER — Telehealth: Payer: Self-pay

## 2023-04-06 ENCOUNTER — Telehealth: Payer: Self-pay | Admitting: Student

## 2023-04-06 DIAGNOSIS — I480 Paroxysmal atrial fibrillation: Secondary | ICD-10-CM

## 2023-04-06 LAB — CBC
Hematocrit: 27.4 % — ABNORMAL LOW (ref 37.5–51.0)
Hemoglobin: 8.7 g/dL — ABNORMAL LOW (ref 13.0–17.7)
MCH: 30.4 pg (ref 26.6–33.0)
MCHC: 31.8 g/dL (ref 31.5–35.7)
MCV: 96 fL (ref 79–97)
Platelets: 361 10*3/uL (ref 150–450)
RBC: 2.86 x10E6/uL — ABNORMAL LOW (ref 4.14–5.80)
RDW: 14.3 % (ref 11.6–15.4)
WBC: 6 10*3/uL (ref 3.4–10.8)

## 2023-04-06 LAB — BASIC METABOLIC PANEL
BUN/Creatinine Ratio: 8 — ABNORMAL LOW (ref 10–24)
BUN: 59 mg/dL — ABNORMAL HIGH (ref 8–27)
CO2: 15 mmol/L — ABNORMAL LOW (ref 20–29)
Calcium: 9 mg/dL (ref 8.6–10.2)
Chloride: 108 mmol/L — ABNORMAL HIGH (ref 96–106)
Creatinine, Ser: 7.01 mg/dL — ABNORMAL HIGH (ref 0.76–1.27)
Glucose: 93 mg/dL (ref 70–99)
Potassium: 6.7 mmol/L (ref 3.5–5.2)
Sodium: 138 mmol/L (ref 134–144)
eGFR: 7 mL/min/{1.73_m2} — ABNORMAL LOW (ref >59)

## 2023-04-06 LAB — CALCIUM, IONIZED: Calcium, Ionized, Serum: 4.4 mg/dL — ABNORMAL LOW (ref 4.5–5.6)

## 2023-04-06 NOTE — Telephone Encounter (Signed)
-----   Message from Graciella Freer, PA-C sent at 04/06/2023 12:20 PM EDT ----- Derrick Hendrix, this is Derrick Hendrix, not [place].   He'll still need a repeat BMET/CBC in about 10-14 days after restarting Eliquis  Thank you!

## 2023-04-06 NOTE — Telephone Encounter (Signed)
Lab work has been addressed and patient was seen in ED

## 2023-04-06 NOTE — Telephone Encounter (Signed)
Spoke with [facility]. Potassium is 6.7  Prolonged exposure of serum to cells.  Lab work done on 4/19

## 2023-04-06 NOTE — Telephone Encounter (Signed)
[facility] is calling to report critical labs for the patient. Call transferred to triage.

## 2023-04-09 ENCOUNTER — Telehealth: Payer: Self-pay | Admitting: Student

## 2023-04-09 NOTE — Telephone Encounter (Signed)
Notes from Otilio Saber, [facility]: Cardiac clearance for dental extraction Low risk from cardiac perspective, especially with local anaesthesia.  He was newly resumed on Eliquis today. He may hold that for 48 hours prior to procedure per our office protocol, and then should resume it as soon as safe to do so after.

## 2023-04-09 NOTE — Telephone Encounter (Signed)
Patient's wife is requesting the dental clearance the patient was seen for on 04/22 be sent to the fax number listed on the clearance request. Please advise.

## 2023-04-09 NOTE — Telephone Encounter (Signed)
Notes have been re-faxed to [facility] .

## 2023-04-13 ENCOUNTER — Telehealth: Payer: Self-pay | Admitting: Student

## 2023-04-13 NOTE — Telephone Encounter (Signed)
Derrick Hendrix with [facility] is f/u on the clearance. She states at this time, they haven't heard anything back. Advised Derrick Hendrix that per Carol's note on 4/25, notes were re-faxed to [facility] office. She states she has not received it. She asked if it could be emailed to her at - [email or web address]. She also requested that after it is emailed and/or re-faxed, for the office to call her at [phone] and ask for Derrick Hendrix to ensure they did receive it this time.

## 2023-04-13 NOTE — Telephone Encounter (Signed)
Re-faxed clearance. Spoke with Miracle and she confirmed that they have received it.

## 2023-04-20 ENCOUNTER — Ambulatory Visit: Attending: Student

## 2023-04-20 ENCOUNTER — Encounter (HOSPITAL_BASED_OUTPATIENT_CLINIC_OR_DEPARTMENT_OTHER): Payer: Self-pay | Admitting: Emergency Medicine

## 2023-04-20 ENCOUNTER — Other Ambulatory Visit: Payer: Self-pay

## 2023-04-20 ENCOUNTER — Telehealth: Payer: Self-pay | Admitting: Student

## 2023-04-20 ENCOUNTER — Telehealth: Payer: Self-pay | Admitting: Nurse Practitioner

## 2023-04-20 ENCOUNTER — Inpatient Hospital Stay (HOSPITAL_BASED_OUTPATIENT_CLINIC_OR_DEPARTMENT_OTHER)
Admission: EM | Admit: 2023-04-20 | Discharge: 2023-04-22 | DRG: 640 | Disposition: A | Discharge status: Hospice | Attending: Infectious Diseases | Admitting: Infectious Diseases

## 2023-04-20 DIAGNOSIS — I480 Paroxysmal atrial fibrillation: Secondary | ICD-10-CM | POA: Diagnosis not present

## 2023-04-20 DIAGNOSIS — E1151 Type 2 diabetes mellitus with diabetic peripheral angiopathy without gangrene: Secondary | ICD-10-CM | POA: Diagnosis present

## 2023-04-20 DIAGNOSIS — Z79899 Other long term (current) drug therapy: Secondary | ICD-10-CM

## 2023-04-20 DIAGNOSIS — N189 Chronic kidney disease, unspecified: Secondary | ICD-10-CM

## 2023-04-20 DIAGNOSIS — D638 Anemia in other chronic diseases classified elsewhere: Secondary | ICD-10-CM | POA: Diagnosis present

## 2023-04-20 DIAGNOSIS — E1122 Type 2 diabetes mellitus with diabetic chronic kidney disease: Secondary | ICD-10-CM | POA: Diagnosis present

## 2023-04-20 DIAGNOSIS — Z8249 Family history of ischemic heart disease and other diseases of the circulatory system: Secondary | ICD-10-CM

## 2023-04-20 DIAGNOSIS — I129 Hypertensive chronic kidney disease with stage 1 through stage 4 chronic kidney disease, or unspecified chronic kidney disease: Secondary | ICD-10-CM | POA: Diagnosis not present

## 2023-04-20 DIAGNOSIS — E875 Hyperkalemia: Secondary | ICD-10-CM | POA: Diagnosis not present

## 2023-04-20 DIAGNOSIS — Z7901 Long term (current) use of anticoagulants: Secondary | ICD-10-CM

## 2023-04-20 DIAGNOSIS — N186 End stage renal disease: Secondary | ICD-10-CM | POA: Diagnosis present

## 2023-04-20 DIAGNOSIS — Z886 Allergy status to analgesic agent status: Secondary | ICD-10-CM

## 2023-04-20 DIAGNOSIS — I10 Essential (primary) hypertension: Secondary | ICD-10-CM | POA: Diagnosis present

## 2023-04-20 DIAGNOSIS — Z515 Encounter for palliative care: Secondary | ICD-10-CM

## 2023-04-20 DIAGNOSIS — N185 Chronic kidney disease, stage 5: Secondary | ICD-10-CM | POA: Diagnosis present

## 2023-04-20 DIAGNOSIS — N401 Enlarged prostate with lower urinary tract symptoms: Secondary | ICD-10-CM | POA: Diagnosis present

## 2023-04-20 DIAGNOSIS — E785 Hyperlipidemia, unspecified: Secondary | ICD-10-CM | POA: Diagnosis present

## 2023-04-20 DIAGNOSIS — N138 Other obstructive and reflux uropathy: Secondary | ICD-10-CM | POA: Diagnosis present

## 2023-04-20 DIAGNOSIS — Z87891 Personal history of nicotine dependence: Secondary | ICD-10-CM

## 2023-04-20 DIAGNOSIS — Z66 Do not resuscitate: Secondary | ICD-10-CM | POA: Diagnosis present

## 2023-04-20 DIAGNOSIS — D631 Anemia in chronic kidney disease: Secondary | ICD-10-CM | POA: Diagnosis present

## 2023-04-20 DIAGNOSIS — Z8673 Personal history of transient ischemic attack (TIA), and cerebral infarction without residual deficits: Secondary | ICD-10-CM

## 2023-04-20 DIAGNOSIS — Z833 Family history of diabetes mellitus: Secondary | ICD-10-CM

## 2023-04-20 DIAGNOSIS — I120 Hypertensive chronic kidney disease with stage 5 chronic kidney disease or end stage renal disease: Secondary | ICD-10-CM | POA: Diagnosis present

## 2023-04-20 LAB — BASIC METABOLIC PANEL
Anion gap: 11 (ref 5–15)
BUN: 55 mg/dL — ABNORMAL HIGH (ref 8–27)
BUN: 62 mg/dL — ABNORMAL HIGH (ref 8–23)
CO2: 16 mmol/L — ABNORMAL LOW (ref 22–32)
Calcium: 8.8 mg/dL (ref 8.6–10.2)
Calcium: 8.6 mg/dL — ABNORMAL LOW (ref 8.9–10.3)
Chloride: 109 mmol/L (ref 98–111)
Creatinine, Ser: 6.51 mg/dL — ABNORMAL HIGH (ref 0.61–1.24)
GFR, Estimated: 8 mL/min — ABNORMAL LOW (ref >60)
Glucose, Bld: 124 mg/dL — ABNORMAL HIGH (ref 70–99)
Potassium: 6.4 mmol/L (ref 3.5–5.2)
Potassium: 6.8 mmol/L (ref 3.5–5.1)
Sodium: 136 mmol/L (ref 135–145)

## 2023-04-20 LAB — CBC
HCT: 26 % — ABNORMAL LOW (ref 39.0–52.0)
Hemoglobin: 8.1 g/dL — ABNORMAL LOW (ref 13.0–17.0)
MCH: 31.5 pg (ref 26.0–34.0)
MCHC: 31.2 g/dL (ref 30.0–36.0)
MCV: 101.2 fL — ABNORMAL HIGH (ref 80.0–100.0)
Platelets: 269 10*3/uL (ref 150–400)
RBC: 2.57 MIL/uL — ABNORMAL LOW (ref 4.22–5.81)
RDW: 15.7 % — ABNORMAL HIGH (ref 11.5–15.5)
WBC: 7.4 10*3/uL (ref 4.0–10.5)
nRBC: 0 % (ref 0.0–0.2)

## 2023-04-20 LAB — CBG MONITORING, ED: Glucose-Capillary: 157 mg/dL — ABNORMAL HIGH (ref 70–99)

## 2023-04-20 MED ORDER — SODIUM ZIRCONIUM CYCLOSILICATE 10 G PO PACK
10.0000 g | PACK | Freq: Once | ORAL | Status: AC
  Administered 2023-04-20: 10 g via ORAL
  Filled 2023-04-20: qty 1

## 2023-04-20 MED ORDER — CALCIUM GLUCONATE-NACL 1-0.675 GM/50ML-% IV SOLN
1.0000 g | Freq: Once | INTRAVENOUS | Status: AC
  Administered 2023-04-20: 1000 mg via INTRAVENOUS
  Filled 2023-04-20: qty 50

## 2023-04-20 MED ORDER — DEXTROSE 50 % IV SOLN
1.0000 | Freq: Once | INTRAVENOUS | Status: AC
  Administered 2023-04-20: 50 mL via INTRAVENOUS
  Filled 2023-04-20: qty 50

## 2023-04-20 MED ORDER — INSULIN ASPART 100 UNIT/ML IV SOLN
5.0000 [IU] | Freq: Once | INTRAVENOUS | Status: AC
  Administered 2023-04-20: 5 [IU] via INTRAVENOUS

## 2023-04-20 NOTE — ED Provider Notes (Signed)
[place] EMERGENCY DEPARTMENT AT [facility] Provider Note   CSN: [id number] Arrival date & time: 04/20/23  2024     History  Chief Complaint  Patient presents with   Abnormal Lab    Derrick Hendrix. is a 82 y.o. male.   Abnormal Lab    Patient has a history of hypertension chronic kidney disease, peripheral artery disease, stroke, hyperlipidemia who presents to the ED for evaluation of hyperkalemia.  Patient was recently admitted to the hospital for an acute kidney injury as well as hyperkalemia.  Patient had outpatient follow-up today.  He had laboratory tests drawn and was told that his potassium was elevated and that he should come to the hospital for evaluation.  Patient denies any trouble with weakness.  He denies any palpitations.  He denies any complaints.  Home Medications Prior to Admission medications   Medication Sig Start Date End Date Taking? Authorizing Provider  amLODipine (NORVASC) 5 MG tablet Take 5 mg by mouth 2 (two) times daily. 02/04/23   [provider]  antiseptic oral rinse (BIOTENE) LIQD Apply 15 mLs topically 2 (two) times daily. 04/02/22   Quincy Simmonds, MD  apixaban (ELIQUIS) 2.5 MG TABS tablet Take 1 tablet (2.5 mg total) by mouth 2 (two) times daily. Patient not taking: Reported on 04/05/2023 04/03/23   Graciella Freer, PA-C  BANOPHEN 25 MG capsule Take 25 mg by mouth 3 (three) times daily. 03/04/23   [provider]  camphor-menthol Wynelle Fanny) lotion Apply topically as needed for itching. Patient taking differently: Apply 1 Application topically as needed for itching. 04/02/22   Quincy Simmonds, MD  finasteride (PROSCAR) 5 MG tablet TAKE ONE TABLET BY MOUTH DAILY Patient taking differently: Take 5 mg by mouth daily. 10/02/21   [name], MD  glycopyrrolate (ROBINUL) 1 MG tablet Take 1 tablet (1 mg total) by mouth every 4 (four) hours as needed (excessive secretions). Patient not taking: Reported on 04/05/2023  04/02/22   Quincy Simmonds, MD  haloperidol (HALDOL) 2 MG tablet Take 1 tablet (2 mg total) by mouth every 6 (six) hours as needed for agitation (or delirium). 04/02/22   Quincy Simmonds, MD  hydrALAZINE (APRESOLINE) 10 MG tablet Take 15 mg by mouth 3 (three) times daily. 04/03/23   [provider]  HYDROmorphone (DILAUDID) 1 MG/ML injection Inject 0.5-1 mLs (0.5-1 mg total) into the vein every 2 (two) hours as needed for severe pain or moderate pain (dyspnea, increased work of breathing, RR >25, distress). Patient not taking: Reported on 04/05/2023 04/02/22   Quincy Simmonds, MD  LORazepam (ATIVAN) 1 MG tablet Take 1 tablet (1 mg total) by mouth every hour as needed for anxiety, seizure or sleep (distress). Patient taking differently: Take 1 mg by mouth every 8 (eight) hours as needed for anxiety, seizure or sleep (distress). 04/02/22   Quincy Simmonds, MD  ondansetron (ZOFRAN-ODT) 4 MG disintegrating tablet Take 1 tablet (4 mg total) by mouth every 6 (six) hours as needed for nausea. Patient not taking: Reported on 04/05/2023 04/02/22   Quincy Simmonds, MD  tamsulosin (FLOMAX) 0.4 MG CAPS capsule TAKE TWO CAPSULES BY MOUTH DAILY Patient taking differently: Take 0.8 mg by mouth daily. 10/02/21   [name], MD  traZODone (DESYREL) 50 MG tablet Take 50 mg by mouth at bedtime. 03/16/23   [provider]      Allergies    Aspirin    Review of Systems   Review of Systems  Physical Exam Updated Vital Signs BP [name])  170/85   Pulse 98   Temp 98.4 F (36.9 C) (Oral)   Resp 16   SpO2 98%  Physical Exam Vitals and nursing note reviewed.  Constitutional:      General: He is not in acute distress.    Appearance: He is well-developed.  HENT:     Head: Normocephalic and atraumatic.     Right Ear: External ear normal.     Left Ear: External ear normal.  Eyes:     General: No scleral icterus.       Right eye: No discharge.        Left eye: No discharge.     Conjunctiva/sclera:  Conjunctivae normal.  Neck:     Trachea: No tracheal deviation.  Cardiovascular:     Rate and Rhythm: Normal rate.  Pulmonary:     Effort: Pulmonary effort is normal. No respiratory distress.     Breath sounds: No stridor.  Abdominal:     General: There is no distension.  Musculoskeletal:        General: No swelling or deformity.     Cervical back: Neck supple.  Skin:    General: Skin is warm and dry.     Findings: No rash.  Neurological:     Mental Status: He is alert. Mental status is at baseline.     Cranial Nerves: No dysarthria or facial asymmetry.     Motor: No seizure activity.     ED Results / Procedures / Treatments   Labs (all labs ordered are listed, but only abnormal results are displayed) Labs Reviewed  BASIC METABOLIC PANEL - Abnormal; Notable for the following components:      Result Value   Potassium 6.8 (*)    CO2 16 (*)    Glucose, Bld 124 (*)    BUN 62 (*)    Creatinine, Ser 6.51 (*)    Calcium 8.6 (*)    GFR, Estimated 8 (*)    All other components within normal limits  CBC - Abnormal; Notable for the following components:   RBC 2.57 (*)    Hemoglobin 8.1 (*)    HCT 26.0 (*)    MCV 101.2 (*)    RDW 15.7 (*)    All other components within normal limits    EKG EKG Interpretation  Date/Time:  Monday Apr 20 2023 20:37:23 EDT Ventricular Rate:  111 PR Interval:  42 QRS Duration: 82 QT Interval:  323 QTC Calculation: 439 R Axis:   50 Text Interpretation: Sinus tachycardia Abnormal R-wave progression, early transition Probable LVH with secondary repol abnrm No significant change since last tracing Confirmed by Linwood Dibbles [phone]) on 04/20/2023 8:54:23 PM  Radiology No results found.  Procedures .Critical Care  Performed by: Linwood Dibbles, MD Authorized by: Linwood Dibbles, MD   Critical care provider statement:    Critical care time (minutes):  30   Critical care was time spent personally by me on the following activities:  Development of  treatment plan with patient or surrogate, discussions with consultants, evaluation of patient's response to treatment, examination of patient, ordering and review of laboratory studies, ordering and review of radiographic studies, ordering and performing treatments and interventions, pulse oximetry, re-evaluation of patient's condition and review of old charts     Medications Ordered in ED Medications  sodium zirconium cyclosilicate (LOKELMA) packet 10 g (has no administration in time range)  calcium gluconate 1 g/ 50 mL sodium chloride IVPB (1,000 mg Intravenous New Bag/Given 04/20/23 2214)  insulin  aspart (novoLOG) injection 5 Units (5 Units Intravenous Given 04/20/23 2212)    And  dextrose 50 % solution 50 mL (50 mLs Intravenous Given 04/20/23 2210)    ED Course/ Medical Decision Making/ A&P Clinical Course as of 04/20/23 2241  Mon Apr 20, 2023  2128 Metabolic panel does show elevated creatinine and potassium of 6.8 [JK]  2241 Case discussed with Dr. Antionette Char regarding admission [JK]    Clinical Course User Index [JK] Linwood Dibbles, MD                             Medical Decision Making Problems Addressed: Chronic kidney disease, unspecified CKD stage: chronic illness or injury with exacerbation, progression, or side effects of treatment Hyperkalemia: acute illness or injury that poses a threat to life or bodily functions  Amount and/or Complexity of Data Reviewed Labs: ordered. Decision-making details documented in ED Course.  Risk OTC drugs. Prescription drug management.   Patient presents ED for evaluation of hyperkalemia.  Patient was recently in the hospital last month for acute kidney injury and hyperkalemia.  Patient's potassium had improved to 5.3 April 21.  Patient had previously declined dialysis.  He is under hospice care.  He was evaluated overnight and discharged.  Patient presented back to the ED today with similar symptoms of hyperkalemia.  His labs confirmed an elevated  potassium of 6.8.  I have ordered calcium channel blockers insulin and glucose.  I will consult the medical service for admission.        Final Clinical Impression(s) / ED Diagnoses Final diagnoses:  Hyperkalemia  Chronic kidney disease, unspecified CKD stage    Rx / DC Orders ED Discharge Orders     None         Linwood Dibbles, MD 04/20/23 2242

## 2023-04-20 NOTE — Telephone Encounter (Signed)
Called and reported lab results to patient's niece Kennith Center (OK per DPR).  Per Otilio Saber, PA-C: Unfortunately K is again dangerously elevated. Would recommend having rechecked in ER and close follow up with hospice provider to decide best course moving forward.   May ultimately need daily lokelma   Please make sure he is not taking any potassium supplementation or vitamins with high potassium content and would avoid foods high in potassium.   Kennith Center states patient will not go to ED. She states the last time he went for elevated potassium he was told he needs dialysis and patient refuses to "go through that process."  Kennith Center states patient is not taking any vitamins with potassium, reviewed high potassium foods to avoid (denies patient has been eating high K+ foods).  Called and left message with [facility], spoke with Wynona Canes. Left message for hospice nurse notifying them of elevated potassium level of 6.4. Provided office number for callback.

## 2023-04-20 NOTE — Telephone Encounter (Signed)
   Received call from [facility] this evening re: critical potassium.  Chart reviewed.  Recent admission for similar in late April.  I spoke to both of his nieces and also to Mr. Lequita Halt.  They plan to present to the ED tonight for mgmt.  Has prev declined HD.  Niece notes that he was told he'd be sent in [place] at time of last d/c, however rx never sent.  Lab Results  Component Value Date   CREATININE 5.93 (H) 04/20/2023   BUN 55 (H) 04/20/2023   NA 136 04/20/2023   K 6.4 (HH) 04/20/2023   CL 107 (H) 04/20/2023   CO2 14 (L) 04/20/2023    Nicolasa Ducking, NP 04/20/2023, 6:22 PM

## 2023-04-20 NOTE — ED Triage Notes (Signed)
Called by cardiologist called reports k 6.4 and told patient to get eval at ed.labs done today at [facility] Denies new s/s

## 2023-04-20 NOTE — Progress Notes (Signed)
Plan of Care Note for accepted transfer   Patient: Derrick Hendrix. MRN: [id number]   DOA: 04/20/2023  Facility requesting transfer: [facility]   Requesting Provider: Dr. Lynelle Doctor   Reason for transfer: hyperkalemia   Facility course: Derrick Hendrix with HTN, T2DM, PAD, CVA, and ESRD who has declined dialysis and is on home hospice, but wants ongoing medical management where possible and now presents at the direction of Cardiology NP after outpatient labs revealed recurrent hyperkalemia.   Serum potassium is 6.8 without EKG changes. He was treated with IV calcium, insulin with dextrose, and Lokelma in ED. Pt/family requesting admission for medical management of this but continue to decline dialysis.   Plan of care: The patient is accepted for admission to Progressive unit, at [facility].   Author: Briscoe Deutscher, MD 04/20/2023  Check [email or web address] for on-call coverage.  Nursing staff, Please call [facility] Admits & Consults System-Wide number on Amion as soon as patient's arrival, so appropriate admitting provider can evaluate the pt.

## 2023-04-21 DIAGNOSIS — E1151 Type 2 diabetes mellitus with diabetic peripheral angiopathy without gangrene: Secondary | ICD-10-CM | POA: Diagnosis not present

## 2023-04-21 DIAGNOSIS — I120 Hypertensive chronic kidney disease with stage 5 chronic kidney disease or end stage renal disease: Secondary | ICD-10-CM | POA: Diagnosis not present

## 2023-04-21 DIAGNOSIS — Z66 Do not resuscitate: Secondary | ICD-10-CM | POA: Diagnosis not present

## 2023-04-21 DIAGNOSIS — N138 Other obstructive and reflux uropathy: Secondary | ICD-10-CM | POA: Diagnosis not present

## 2023-04-21 DIAGNOSIS — D638 Anemia in other chronic diseases classified elsewhere: Secondary | ICD-10-CM | POA: Diagnosis not present

## 2023-04-21 DIAGNOSIS — Z886 Allergy status to analgesic agent status: Secondary | ICD-10-CM | POA: Diagnosis not present

## 2023-04-21 DIAGNOSIS — E1122 Type 2 diabetes mellitus with diabetic chronic kidney disease: Secondary | ICD-10-CM | POA: Diagnosis not present

## 2023-04-21 DIAGNOSIS — D631 Anemia in chronic kidney disease: Secondary | ICD-10-CM | POA: Diagnosis not present

## 2023-04-21 DIAGNOSIS — N186 End stage renal disease: Secondary | ICD-10-CM | POA: Diagnosis not present

## 2023-04-21 DIAGNOSIS — Z79899 Other long term (current) drug therapy: Secondary | ICD-10-CM | POA: Diagnosis not present

## 2023-04-21 DIAGNOSIS — Z87891 Personal history of nicotine dependence: Secondary | ICD-10-CM | POA: Diagnosis not present

## 2023-04-21 DIAGNOSIS — Z515 Encounter for palliative care: Secondary | ICD-10-CM

## 2023-04-21 DIAGNOSIS — I10 Essential (primary) hypertension: Secondary | ICD-10-CM

## 2023-04-21 DIAGNOSIS — Z8673 Personal history of transient ischemic attack (TIA), and cerebral infarction without residual deficits: Secondary | ICD-10-CM | POA: Diagnosis not present

## 2023-04-21 DIAGNOSIS — Z833 Family history of diabetes mellitus: Secondary | ICD-10-CM | POA: Diagnosis not present

## 2023-04-21 DIAGNOSIS — Z8249 Family history of ischemic heart disease and other diseases of the circulatory system: Secondary | ICD-10-CM | POA: Diagnosis not present

## 2023-04-21 DIAGNOSIS — N401 Enlarged prostate with lower urinary tract symptoms: Secondary | ICD-10-CM

## 2023-04-21 DIAGNOSIS — E785 Hyperlipidemia, unspecified: Secondary | ICD-10-CM | POA: Diagnosis not present

## 2023-04-21 DIAGNOSIS — I480 Paroxysmal atrial fibrillation: Secondary | ICD-10-CM | POA: Diagnosis not present

## 2023-04-21 DIAGNOSIS — N185 Chronic kidney disease, stage 5: Secondary | ICD-10-CM

## 2023-04-21 DIAGNOSIS — E875 Hyperkalemia: Secondary | ICD-10-CM | POA: Diagnosis not present

## 2023-04-21 DIAGNOSIS — Z7901 Long term (current) use of anticoagulants: Secondary | ICD-10-CM | POA: Diagnosis not present

## 2023-04-21 LAB — CBC
HCT: 24.5 % — ABNORMAL LOW (ref 39.0–52.0)
Hematocrit: 25.2 % — ABNORMAL LOW (ref 37.5–51.0)
Hemoglobin: 7.8 g/dL — ABNORMAL LOW (ref 13.0–17.0)
MCH: 31.6 pg (ref 26.0–34.0)
MCHC: 32.1 g/dL (ref 31.5–35.7)
MCHC: 31.8 g/dL (ref 30.0–36.0)
MCV: 96 fL (ref 79–97)
MCV: 99.2 fL (ref 80.0–100.0)
Platelets: 290 10*3/uL (ref 150–450)
Platelets: 248 10*3/uL (ref 150–400)
RBC: 2.64 x10E6/uL — CL (ref 4.14–5.80)
RBC: 2.47 MIL/uL — ABNORMAL LOW (ref 4.22–5.81)
RDW: 14.5 % (ref 11.6–15.4)
RDW: 15.1 % (ref 11.5–15.5)
WBC: 6.5 10*3/uL (ref 3.4–10.8)
WBC: 7.2 10*3/uL (ref 4.0–10.5)
nRBC: 0 % (ref 0.0–0.2)

## 2023-04-21 LAB — RENAL FUNCTION PANEL
Albumin: 3 g/dL — ABNORMAL LOW (ref 3.5–5.0)
Anion gap: 10 (ref 5–15)
BUN: 62 mg/dL — ABNORMAL HIGH (ref 8–23)
CO2: 15 mmol/L — ABNORMAL LOW (ref 22–32)
Calcium: 8.4 mg/dL — ABNORMAL LOW (ref 8.9–10.3)
Chloride: 114 mmol/L — ABNORMAL HIGH (ref 98–111)
Creatinine, Ser: 6 mg/dL — ABNORMAL HIGH (ref 0.61–1.24)
GFR, Estimated: 9 mL/min — ABNORMAL LOW
Glucose, Bld: 82 mg/dL (ref 70–99)
Phosphorus: 4.9 mg/dL — ABNORMAL HIGH (ref 2.5–4.6)
Potassium: 5.6 mmol/L — ABNORMAL HIGH (ref 3.5–5.1)
Sodium: 139 mmol/L (ref 135–145)

## 2023-04-21 LAB — BASIC METABOLIC PANEL
BUN/Creatinine Ratio: 9 — ABNORMAL LOW (ref 10–24)
CO2: 14 mmol/L — ABNORMAL LOW (ref 20–29)
Chloride: 107 mmol/L — ABNORMAL HIGH (ref 96–106)
Creatinine, Ser: 5.93 mg/dL — ABNORMAL HIGH (ref 0.76–1.27)
Glucose: 97 mg/dL (ref 70–99)
Sodium: 136 mmol/L (ref 134–144)
eGFR: 9 mL/min/{1.73_m2} — ABNORMAL LOW (ref >59)

## 2023-04-21 LAB — ABO/RH: ABO/RH(D): A POS

## 2023-04-21 MED ORDER — SODIUM ZIRCONIUM CYCLOSILICATE 10 G PO PACK: 10.0000 g | PACK | Freq: Three times a day (TID) | ORAL | Status: DC

## 2023-04-21 MED ORDER — AMLODIPINE BESYLATE 5 MG PO TABS
5.0000 mg | ORAL_TABLET | Freq: Two times a day (BID) | ORAL | Status: DC
  Administered 2023-04-21 – 2023-04-22 (×2): 5 mg via ORAL
  Filled 2023-04-21 (×2): qty 1

## 2023-04-21 MED ORDER — DIPHENHYDRAMINE HCL 25 MG PO CAPS
25.0000 mg | ORAL_CAPSULE | Freq: Three times a day (TID) | ORAL | Status: DC
  Administered 2023-04-21 – 2023-04-22 (×3): 25 mg via ORAL
  Filled 2023-04-21 (×3): qty 1

## 2023-04-21 MED ORDER — TAMSULOSIN HCL 0.4 MG PO CAPS
0.8000 mg | ORAL_CAPSULE | Freq: Every day | ORAL | Status: DC
  Administered 2023-04-21 – 2023-04-22 (×2): 0.8 mg via ORAL
  Filled 2023-04-21 (×2): qty 2

## 2023-04-21 MED ORDER — APIXABAN 2.5 MG PO TABS
2.5000 mg | ORAL_TABLET | Freq: Two times a day (BID) | ORAL | Status: DC
  Administered 2023-04-21 – 2023-04-22 (×2): 2.5 mg via ORAL
  Filled 2023-04-21 (×2): qty 1

## 2023-04-21 MED ORDER — SODIUM CHLORIDE 0.9% FLUSH
3.0000 mL | Freq: Two times a day (BID) | INTRAVENOUS | Status: DC
  Administered 2023-04-21 – 2023-04-22 (×3): 3 mL via INTRAVENOUS

## 2023-04-21 MED ORDER — HYDRALAZINE HCL 10 MG PO TABS
15.0000 mg | ORAL_TABLET | Freq: Three times a day (TID) | ORAL | Status: DC
  Administered 2023-04-21 – 2023-04-22 (×3): 15 mg via ORAL
  Filled 2023-04-21 (×5): qty 2

## 2023-04-21 MED ORDER — ONDANSETRON HCL 4 MG/2ML IJ SOLN: 4.0000 mg | Freq: Four times a day (QID) | INTRAMUSCULAR | Status: DC | PRN

## 2023-04-21 MED ORDER — ALBUTEROL SULFATE (2.5 MG/3ML) 0.083% IN NEBU: 2.5000 mg | INHALATION_SOLUTION | Freq: Four times a day (QID) | RESPIRATORY_TRACT | Status: DC | PRN

## 2023-04-21 MED ORDER — HYDRALAZINE HCL 20 MG/ML IJ SOLN: 10.0000 mg | INTRAMUSCULAR | Status: DC | PRN

## 2023-04-21 MED ORDER — ACETAMINOPHEN 325 MG PO TABS: 650.0000 mg | ORAL_TABLET | Freq: Four times a day (QID) | ORAL | Status: DC | PRN

## 2023-04-21 MED ORDER — SODIUM ZIRCONIUM CYCLOSILICATE 10 G PO PACK
10.0000 g | PACK | Freq: Two times a day (BID) | ORAL | Status: DC
  Administered 2023-04-21: 10 g via ORAL
  Filled 2023-04-21: qty 1

## 2023-04-21 MED ORDER — ACETAMINOPHEN 650 MG RE SUPP: 650.0000 mg | Freq: Four times a day (QID) | RECTAL | Status: DC | PRN

## 2023-04-21 MED ORDER — TRAZODONE HCL 50 MG PO TABS
50.0000 mg | ORAL_TABLET | Freq: Every day | ORAL | Status: DC
  Filled 2023-04-21: qty 1

## 2023-04-21 MED ORDER — HALOPERIDOL 1 MG PO TABS: 2.0000 mg | ORAL_TABLET | Freq: Four times a day (QID) | ORAL | Status: DC | PRN

## 2023-04-21 MED ORDER — CAMPHOR-MENTHOL 0.5-0.5 % EX LOTN: 1.0000 | TOPICAL_LOTION | CUTANEOUS | Status: DC | PRN

## 2023-04-21 MED ORDER — BIOTENE DRY MOUTH MT LIQD
15.0000 mL | Freq: Two times a day (BID) | OROMUCOSAL | Status: DC
  Administered 2023-04-22: 15 mL via TOPICAL

## 2023-04-21 MED ORDER — SODIUM ZIRCONIUM CYCLOSILICATE 10 G PO PACK
10.0000 g | PACK | Freq: Two times a day (BID) | ORAL | Status: AC
  Administered 2023-04-21: 10 g via ORAL
  Filled 2023-04-21: qty 1

## 2023-04-21 MED ORDER — LORAZEPAM 2 MG/ML IJ SOLN
1.0000 mg | Freq: Once | INTRAMUSCULAR | Status: AC
  Administered 2023-04-21: 1 mg via INTRAVENOUS
  Filled 2023-04-21: qty 1

## 2023-04-21 MED ORDER — FINASTERIDE 5 MG PO TABS
5.0000 mg | ORAL_TABLET | Freq: Every day | ORAL | Status: DC
  Administered 2023-04-21 – 2023-04-22 (×2): 5 mg via ORAL
  Filled 2023-04-21 (×2): qty 1

## 2023-04-21 MED ORDER — ONDANSETRON HCL 4 MG PO TABS: 4.0000 mg | ORAL_TABLET | Freq: Four times a day (QID) | ORAL | Status: DC | PRN

## 2023-04-21 NOTE — Progress Notes (Signed)
         [facility] 2W18 [facility]) Hospitalized Hospice Patient Visit   Derrick Hendrix is a current [facility] hospice patient with a terminal diagnosis of ESRD. Patient came to ED on 5/6 at the advice of his cardiologist due to a critical potassium level.  Per Dr. Anne Hendrix, [facility] MD, this is a related hospice admission.   Rounded on patient in room who states he is pain free and with no complaints.  He states he has not yet seen his physician today.  He expressed desire to eat/drink.  Followed up with his bedside RN who states she will bring him something to drink shortly.  Attempted to make contact with patient's niece Derrick Hendrix via phone with no success.  Left message for call back.   Vital Signs- 98.0/103/17    157/99   100% room air I&O- 50/0 Abnormal Labs-  RBC 2.47, Hgb 7.8, HCT 24.5, Potassium 5.6, Chloride 114, CO2 15, BUN 62, Creatinine 6.00, Calcium 8.4 Diagnostics- None new   IV/PRN Meds- None   Problem List-(Per ED MD) Problems Addressed: Chronic kidney disease, unspecified CKD stage: chronic illness or injury with exacerbation, progression, or side effects of treatment Hyperkalemia: acute illness or injury that poses a threat to life or bodily functions   Discharge Planning- Ongoing, confirmed wanted to go home with support of hospice when medically stable.  Please use [facility] for any transportation needs as we contract with them for this service.   Family Contact- Talked with patient at bedside, attempt to contact niece via phone but unable. Educated patient on plan based on current hospital notes. IDT: updated ACC team Goals of Care: Ongoing, at this time patient desires to go home when medically stable.  Thank you for the opportunity to participate in this patient's care, please don't hesitate to call for any hospice related questions or concerns.   Doreatha Martin, RN, BSN [facility] Liaison [phone]

## 2023-04-21 NOTE — H&P (Addendum)
History and Physical    Patient: Derrick Hendrix. ZOX:[id number] DOB: 01-25-1941 DOA: 04/20/2023 DOS: the patient was seen and examined on 04/21/2023 PCP: Modena Slater, DO  Patient coming from: Transfer from [facility]  Chief Complaint:  Chief Complaint  Patient presents with   Abnormal Lab   HPI: Derrick Hendrix. is a 82 y.o. male with medical history significant of  HTN, HLD, CVA, PAD, DM type II, and currently on hospice due to ESRD declining hemodialysis who presents due to abnormal lab work.  Patient had recently had labs obtained by cardiology in the outpatient setting which noted potassium to be 6.4.  It appears he had just recently been hospitalized 4/20-4/21 and due to to elevated potassium levels.  Records note he had been given Kayexalate and received Lokelma.  His niece makes note that he was supposed to be prescribed one of the medicines to use at discharge, but said that it never got sent to his pharmacy [facility].  At home patient reports that he is ambulating with use of a walker.  He has not had much of an appetite and reports that his skin has been itching.  Denies having any fever, shortness of breath, nausea, vomiting, abdominal pain, diarrhea, or blood in stool/urine.  Patient confirms that he does not want to go on hemodialysis even if that would lead to death.  In the emergency department patient was noted to be afebrile with initial tachycardia, tachypnea, blood pressure elevated up to 170/85, and O2 saturation maintained on room air.  Labs significant for potassium 6.8, CO2 16, BUN 62, creatinine 6.51, and anion gap 11.  Patient has been given 10 g of Lokelma p.o., 1 g of calcium gluconate, units of insulin, and amp of D50.  [facility] called to admit as patient requested medical management.  Review of Systems: As mentioned in the history of present illness. All other systems reviewed and are negative. Past Medical History:  Diagnosis Date   Abscess of foot without toes, left 08/28/2017    Chronic kidney disease (CKD), stage III (moderate) (HCC)    Constipation 11/07/2014   Daily headache    "lately" (04/17/2016)   Depression    Dizziness 03/07/2016   Headache 06/26/2014   Heart murmur    Hyperkalemia 10/05/2018   Hyperlipemia 11/11/2016   Hypertension    Neuropathic pain 02/06/2015   Osteomyelitis (HCC)    PAD (peripheral artery disease) (HCC)    Paronychia of great toe, right    Preop cardiovascular exam    Right foot pain 02/09/2017   Stable angina 06/27/2014   Stroke (HCC) 03/2016   hx of PAD; j"ust lots of headaches since" (04/17/2016)   Vitamin B12 deficiency 06/27/2014   Weight loss, unintentional 06/27/2014   Past Surgical History:  Procedure Laterality Date   ABDOMINAL AORTOGRAM W/LOWER EXTREMITY N/A 03/02/2017   Procedure: Abdominal Aortogram w/Lower Extremity;  Surgeon: Fransisco Hertz, MD;  Location[facility] INVASIVE CV LAB;  Service: Cardiovascular;  Laterality: N/A;   EP IMPLANTABLE DEVICE N/A 03/04/2016   Procedure: Loop Recorder Insertion;  Surgeon: Hillis Range, MD;  Location: [facility] INVASIVE CV LAB;  Service: Cardiovascular;  Laterality: N/A;   implantable loop recorder removal  12/19/2020   MDT Reveal LINQ removed by Dr  Johney Frame   PERIPHERAL VASCULAR INTERVENTION Right 03/06/2017   Procedure: Peripheral Vascular Intervention;  Surgeon: Sherren Kerns, MD;  Location: [facility] INVASIVE CV LAB;  Service: Cardiovascular;  Laterality: Right;  SFA   Social History:  reports that  he has quit smoking. His smoking use included cigarettes. He started smoking about 5 years ago. He has never used smokeless tobacco. He reports that he does not currently use alcohol. He reports that he does not use drugs.  Allergies  Allergen Reactions   Aspirin Other (See Comments)    Nosebleeds     Family History  Problem Relation Age of Onset   Hypertension Mother        died age 40   Diabetes Mother     Prior to Admission medications   Medication Sig Start Date End Date Taking? Authorizing  Provider  amLODipine (NORVASC) 5 MG tablet Take 5 mg by mouth 2 (two) times daily. 02/04/23   [provider]  antiseptic oral rinse (BIOTENE) LIQD Apply 15 mLs topically 2 (two) times daily. 04/02/22   Quincy Simmonds, MD  apixaban (ELIQUIS) 2.5 MG TABS tablet Take 1 tablet (2.5 mg total) by mouth 2 (two) times daily. Patient not taking: Reported on 04/05/2023 04/03/23   Graciella Freer, PA-C  BANOPHEN 25 MG capsule Take 25 mg by mouth 3 (three) times daily. 03/04/23   [provider]  camphor-menthol Wynelle Fanny) lotion Apply topically as needed for itching. Patient taking differently: Apply 1 Application topically as needed for itching. 04/02/22   Quincy Simmonds, MD  finasteride (PROSCAR) 5 MG tablet TAKE ONE TABLET BY MOUTH DAILY Patient taking differently: Take 5 mg by mouth daily. 10/02/21   [name], MD  glycopyrrolate (ROBINUL) 1 MG tablet Take 1 tablet (1 mg total) by mouth every 4 (four) hours as needed (excessive secretions). Patient not taking: Reported on 04/05/2023 04/02/22   Quincy Simmonds, MD  haloperidol (HALDOL) 2 MG tablet Take 1 tablet (2 mg total) by mouth every 6 (six) hours as needed for agitation (or delirium). 04/02/22   Quincy Simmonds, MD  hydrALAZINE (APRESOLINE) 10 MG tablet Take 15 mg by mouth 3 (three) times daily. 04/03/23   [provider]  HYDROmorphone (DILAUDID) 1 MG/ML injection Inject 0.5-1 mLs (0.5-1 mg total) into the vein every 2 (two) hours as needed for severe pain or moderate pain (dyspnea, increased work of breathing, RR >25, distress). Patient not taking: Reported on 04/05/2023 04/02/22   Quincy Simmonds, MD  LORazepam (ATIVAN) 1 MG tablet Take 1 tablet (1 mg total) by mouth every hour as needed for anxiety, seizure or sleep (distress). Patient taking differently: Take 1 mg by mouth every 8 (eight) hours as needed for anxiety, seizure or sleep (distress). 04/02/22   Quincy Simmonds, MD  ondansetron (ZOFRAN-ODT) 4 MG disintegrating  tablet Take 1 tablet (4 mg total) by mouth every 6 (six) hours as needed for nausea. Patient not taking: Reported on 04/05/2023 04/02/22   Quincy Simmonds, MD  tamsulosin (FLOMAX) 0.4 MG CAPS capsule TAKE TWO CAPSULES BY MOUTH DAILY Patient taking differently: Take 0.8 mg by mouth daily. 10/02/21   [name], MD  traZODone (DESYREL) 50 MG tablet Take 50 mg by mouth at bedtime. 03/16/23   [provider]    Physical Exam: Vitals:   04/21/23 0300 04/21/23 0400 04/21/23 0500 04/21/23 0700  BP: (!) 162/88 (!) 147/104 (!) 161/93 (!) 156/87  Pulse:  86 82 90  Resp: 18 16 10    Temp:  98.2 F (36.8 C)    TempSrc:  Oral    SpO2: 99% 98% 99% 99%    Constitutional: Elderly male who appears to be chronically ill Eyes: PERRL, lids and conjunctivae normal ENMT: Mucous membranes are moist.  Poor  dentition. Neck: normal, supple, no masses,  Respiratory: clear to auscultation bilaterally, no wheezing, no crackles. Normal respiratory effort. Cardiovascular: Regular rate and rhythm, no murmurs / rubs / gallops. No extremity edema.   Abdomen: no tenderness, no masses palpated. Bowel sounds positive.  Musculoskeletal: no clubbing / cyanosis. No joint deformity upper and lower extremities. Good ROM, no contractures. Normal muscle tone.  Skin: no rashes, lesions, ulcers. No induration Neurologic: CN 2-12 grossly intact.  Able to move all extremities Psychiatric: Alert and oriented to person and place.  Data Reviewed:  EKG reveals sinus tachycardia at 111 bpm with abnormal R wave progression, and LVH.  Otherwise similar to previous tracings.  Reviewed labs, imaging, and pertinent records as noted above in the HPI  Assessment and Plan: Hyperkalemia Acute on chronic.  Potassium noted to be elevated up to 6.8 on admission on 5/6.  Hyperkalemia secondary to end-stage renal disease.  Patient had been given Lokelma 10 g p.o. 1 dose,1 g of calcium gluconate, units of insulin, and amp of  D50. -Admit to a telemetry bed -Continue Lokelma 10 g p.o.  twice daily x 2 doses.  May need lokelma every other day or daily at discharge -Repeat potassium levels this a.m. -Daughter request that [facility] or Kayexalate be prescribed at discharge and [facility] to help potassium levels stay down  CKD (chronic kidney disease), stage V Labs from 5/6 noted potassium 6.8, CO2 16, creatinine 6.51, BUN 62, and anion gap 11.  Patient appears to be end-stage renal disease, but declining dialysis.  Currently on hospice. -Delirium precautions -Continue current medications for itching -Continue to discuss progression of kidney failure without dialysis  Paroxysmal atrial fibrillation Patient appeared to be in a sinus tachycardia on initial tracing.CHA2DS2-VASc score = 7.  Recommended patient be on Eliquis and continued as part of hospice goals reviewed from cardiology notes on 4/19. -Continue Eliquis   History of CVA Due to occlusion of left posterior cerebral artery back in 2017. -Continue Eliquis   Essential hypertension Blood pressures elevated initially up to 175/86. -Continue amlodipine and hydralazine -Hydralazine IV as needed for elevated blood pressures  Anemia of chronic kidney disease Hemoglobin was 8.1->7.8.  Patient did not report any issues with bleeding. -Recheck CBC tomorrow morning  BPH -Continue finasteride and tamsulosin  On hospice care Due to end-stage renal disease without Will to go on hemodialysis. -Haldol as needed for confusion/agitation  DVT prophylaxis: Eliquis Advance Care Planning:   Code Status: DNR   Consults: None Family Communication: Patient's niece first contact on list updated over the phone  Severity of Illness: The appropriate patient status for this patient is OBSERVATION. Observation status is judged to be reasonable and necessary in order to provide the required intensity of service to ensure the patient's safety. The patient's presenting  symptoms, physical exam findings, and initial radiographic and laboratory data in the context of their medical condition is felt to place them at decreased risk for further clinical deterioration. Furthermore, it is anticipated that the patient will be medically stable for discharge from the hospital within 2 midnights of admission.   Author: Clydie Braun, MD 04/21/2023 7:20 AM  For on call review [email or web address].

## 2023-04-21 NOTE — ED Notes (Signed)
[facility] at [facility] will send transport. Bed Ready at [facility] 2W Room# 18.-ABB(NS)

## 2023-04-21 NOTE — Progress Notes (Signed)
Mobility Specialist Progress Note:    04/21/23 1633  Mobility  Activity Transferred from bed to chair  Level of Assistance Contact guard assist, steadying assist  Assistive Device None  Distance Ambulated (ft) 6 ft  Activity Response Tolerated well  $Mobility charge 1 Mobility  Mobility Specialist Start Time (ACUTE ONLY) 1620  Mobility Specialist Stop Time (ACUTE ONLY) 1632  Mobility Specialist Time Calculation (min) (ACUTE ONLY) 12 min   Pt found sitting at EOB, trying to get up and use the rest room, unable to make it in time and assisted with pericare and clean up from void. Pt transferred to chair then back in clean bed with call bell at side.   Thompson Grayer Mobility Specialist  Please contact vis Secure Chat or  Rehab Office [phone]

## 2023-04-21 NOTE — ED Notes (Signed)
Patient assisted up to use urinal tolerated well

## 2023-04-22 ENCOUNTER — Other Ambulatory Visit: Payer: Self-pay | Admitting: Student

## 2023-04-22 DIAGNOSIS — Z515 Encounter for palliative care: Secondary | ICD-10-CM | POA: Diagnosis not present

## 2023-04-22 DIAGNOSIS — E875 Hyperkalemia: Secondary | ICD-10-CM | POA: Diagnosis not present

## 2023-04-22 DIAGNOSIS — N185 Chronic kidney disease, stage 5: Secondary | ICD-10-CM | POA: Diagnosis not present

## 2023-04-22 LAB — RENAL FUNCTION PANEL
Albumin: 2.8 g/dL — ABNORMAL LOW (ref 3.5–5.0)
Anion gap: 10 (ref 5–15)
BUN: 70 mg/dL — ABNORMAL HIGH (ref 8–23)
CO2: 16 mmol/L — ABNORMAL LOW (ref 22–32)
Calcium: 8 mg/dL — ABNORMAL LOW (ref 8.9–10.3)
Chloride: 113 mmol/L — ABNORMAL HIGH (ref 98–111)
Creatinine, Ser: 6.5 mg/dL — ABNORMAL HIGH (ref 0.61–1.24)
GFR, Estimated: 8 mL/min — ABNORMAL LOW (ref >60)
Glucose, Bld: 78 mg/dL (ref 70–99)
Phosphorus: 5.5 mg/dL — ABNORMAL HIGH (ref 2.5–4.6)
Potassium: 5.7 mmol/L — ABNORMAL HIGH (ref 3.5–5.1)
Sodium: 139 mmol/L (ref 135–145)

## 2023-04-22 LAB — CBC
HCT: 24.8 % — ABNORMAL LOW (ref 39.0–52.0)
Hemoglobin: 7.7 g/dL — ABNORMAL LOW (ref 13.0–17.0)
MCH: 31 pg (ref 26.0–34.0)
MCHC: 31 g/dL (ref 30.0–36.0)
MCV: 100 fL (ref 80.0–100.0)
Platelets: 251 10*3/uL (ref 150–400)
RBC: 2.48 MIL/uL — ABNORMAL LOW (ref 4.22–5.81)
RDW: 15.3 % (ref 11.5–15.5)
WBC: 6.4 10*3/uL (ref 4.0–10.5)
nRBC: 0 % (ref 0.0–0.2)

## 2023-04-22 LAB — TYPE AND SCREEN
ABO/RH(D): A POS
Antibody Screen: NEGATIVE

## 2023-04-22 LAB — POTASSIUM: Potassium: 5.3 mmol/L — ABNORMAL HIGH (ref 3.5–5.1)

## 2023-04-22 MED ORDER — SODIUM ZIRCONIUM CYCLOSILICATE 10 G PO PACK
10.0000 g | PACK | Freq: Two times a day (BID) | ORAL | Status: AC
  Administered 2023-04-22: 10 g via ORAL
  Filled 2023-04-22: qty 1

## 2023-04-22 MED ORDER — LOKELMA 10 G PO PACK: 10.0000 g | PACK | Freq: Every day | ORAL | 0 refills | Status: DC

## 2023-04-22 NOTE — Discharge Summary (Signed)
Name: Derrick Hendrix. MRN: [id number] DOB: [birth year] 82 y.o. PCP: Modena Slater, DO  Date of Admission: 04/20/2023  8:29 PM Date of Discharge: 04/22/23 Attending Physician: Ginnie Smart, MD  Subjective: Patient seen at bedside this AM. Reports he feels well, no acute complaints. Mentions that he would be okay going home today. Denies chest pain, palpitations, difficulty breathing, abdominal pain. Has been getting up out of the bed and moving around without difficulty.   Discharge Diagnosis: 1. Principal Problem:   Hyperkalemia Active Problems:   CKD (chronic kidney disease), stage V (HCC)   Essential hypertension   History of CVA (cerebrovascular accident)   Benign prostatic hyperplasia with urinary obstruction   PAF (paroxysmal atrial fibrillation) (HCC)   Hospice care   Anemia of chronic disease  Discharge Medications: Allergies as of 04/22/2023       Reactions   Aspirin Other (See Comments)   Nosebleeds         Medication List     STOP taking these medications    HYDROmorphone 1 MG/ML injection Commonly known as: DILAUDID       TAKE these medications    amLODipine 5 MG tablet Commonly known as: NORVASC Take 5 mg by mouth 2 (two) times daily.   antiseptic oral rinse Liqd Apply 15 mLs topically 2 (two) times daily.   apixaban 2.5 MG Tabs tablet Commonly known as: ELIQUIS Take 1 tablet (2.5 mg total) by mouth 2 (two) times daily.   Banophen 25 mg capsule Generic drug: diphenhydrAMINE Take 25 mg by mouth 3 (three) times daily.   camphor-menthol lotion Commonly known as: SARNA Apply topically as needed for itching. What changed: how much to take   finasteride 5 MG tablet Commonly known as: PROSCAR TAKE ONE TABLET BY MOUTH DAILY   haloperidol 2 MG tablet Commonly known as: HALDOL Take 1 tablet (2 mg total) by mouth every 6 (six) hours as needed for agitation (or delirium).   hydrALAZINE 10 MG tablet Commonly known as: APRESOLINE Take 15 mg  by mouth 3 (three) times daily.   Lokelma 10 g Pack packet Generic drug: sodium zirconium cyclosilicate Take 10 g by mouth daily.   tamsulosin 0.4 MG Caps capsule Commonly known as: FLOMAX TAKE TWO CAPSULES BY MOUTH DAILY   traZODone 50 MG tablet Commonly known as: DESYREL Take 50 mg by mouth at bedtime.       Disposition and follow-up:   Derrick Hendrix. was discharged from [facility] in Stable condition.  At the hospital follow up visit please address:  1.  Hyperkalemia: K 5.3 at discharge, sending out with daily Lokelma.   2.  Labs / imaging needed at time of follow-up: RFP  3.  Pending labs/ test needing follow-up: n/a  Follow-up Appointments:   Follow-up Information     Modena Slater, DO. Schedule an appointment as soon as possible for a visit in 1 week(s).   Contact information: [address] [place] [address] [phone]                Hospital Course by problem list: 1. Acute on chronic hyperkalemia in setting of end-stage renal disease: Found to have potassium elevated to 6.4 at cardiologist's office. Patient asymptomatic at the time, found to not have any ECG changes on arrival to ED. Patient initially given dose of calcium gluconate, insulin, and lokelma. He was [facility] to [facility] for [facility] evaluation. Patient remained on Lokelma and his potassium improved to 5.3  by day of discharge. Given his end-stage renal disease I suspect he will have chronically mild hyperkalemia. Plan to discharge patient with daily Lokelma to keep potassium ~5.0-5.5. We will have him follow-up in our clinic to re-check potassium and ensure he is doing well otherwise.  2. End stage renal disease on hospice Patient has ESRD and has elected to not undergo hemodialysis. He is under the care of hospice currently and is followed by them while living at home. We will continue with his current medications at discharge.   Discharge Exam:   BP (!) 150/85  (BP Location: Left Arm)   Pulse (!) 103   Temp 98.2 F (36.8 C) (Oral)   Resp 16   Ht 6\' 2"  (1.88 m)   Wt 72.1 kg   SpO2 100%   BMI 20.41 kg/m  Discharge exam:  General: Resting comfortably in no acute distress CV: Regular rate, rhythm. No murmurs appreciated. Warm extremities. Pulm: Normal work of breathing on room air. Clear to auscultation bilaterally. GI: Abdomen soft, non-tender, non-distended. Normoactive bowel sounds Neuro: Awake, alert, conversing appropriately. Grossly non-focal. Psych: Pleasant, normal mood, affect, speech.  Pertinent Labs, Studies, and Procedures:     Latest Ref Rng & Units 04/22/2023    3:52 AM 04/21/2023    2:28 PM 04/20/2023    9:29 PM  CBC  WBC 4.0 - 10.5 K/uL 6.4  7.2  7.4   Hemoglobin 13.0 - 17.0 g/dL 7.7  7.8  8.1   Hematocrit 39.0 - 52.0 % 24.8  24.5  26.0   Platelets 150 - 400 K/uL 251  248  269       Latest Ref Rng & Units 04/22/2023    4:03 PM 04/22/2023    3:52 AM 04/21/2023    2:28 PM  BMP  Glucose 70 - 99 mg/dL  78  82   BUN 8 - 23 mg/dL  70  62   Creatinine 8.11 - 1.24 mg/dL  9.14  7.82   Sodium 956 - 145 mmol/L  139  139   Potassium 3.5 - 5.1 mmol/L 5.3  5.7  5.6   Chloride 98 - 111 mmol/L  113  114   CO2 22 - 32 mmol/L  16  15   Calcium 8.9 - 10.3 mg/dL  8.0  8.4     Discharge Instructions: Discharge Instructions     Diet - low sodium heart healthy   Complete by: As directed    Discharge instructions   Complete by: As directed    Derrick Hendrix, Derrick Hendrix were admitted because your potassium was too high. We were able to give you medicine to keep it down. We would like for you to take Lokelma 10g once daily. Please call the clinic if you have any questions or concerns.  Thank you, [name], MD   Increase activity slowly   Complete by: As directed       Signed: [name], MD 04/22/2023, 5:14 PM   Pager: [phone]

## 2023-04-22 NOTE — Progress Notes (Signed)
[facility] 2W18 [facility]) Hospitalized Hospice Patient Visit   Derrick Hendrix is a current [facility] hospice patient with a terminal diagnosis of ESRD. Patient came to ED on 5/6 at the advice of his cardiologist due to a critical potassium level.  Per Dr. Anne Fu, [facility] MD, this is a related hospice admission.   Rounded on patient in the room who states he had a great night.  He told me "if they keep serving me meals like that I won't want to go home."  He denies pain, symptom management needs.  Met with bedside RN who states possible plan for discharge later this afternoon if lab work stabilizes.  Spoke with patient's niece Derrick Hendrix via phone who states she is agreeable for discharge today if medically stable, but requests discharge later this evening after she gets off work at [employer].  Request was relayed to [facility].  No other needs identified at this time.   Vital Signs- 98.1/100/16    151/85   100% room air I&O- 50/100 Abnormal Labs-  RBC 2.48, Hgb 7.7, HCT 24.8, Potassium 5.7, Chloride 113, CO2 16, BUN 70, Creatinine 6.50, Calcium 8.0 Diagnostics- None new   IV/PRN Meds- None   Problem List-(Per Hospitalist H&P) Assessment and Plan: Hyperkalemia Acute on chronic.  Potassium noted to be elevated up to 6.8 on admission on 5/6.  Hyperkalemia secondary to end-stage renal disease.  Patient had been given Lokelma 10 g p.o. 1 dose,1 g of calcium gluconate, units of insulin, and amp of D50. -Admit to a telemetry bed -Continue Lokelma 10 g p.o.  twice daily x 2 doses.  May need lokelma every other day or daily at discharge -Repeat potassium levels this a.m. -Daughter request that [facility] or Kayexalate be prescribed at discharge [facility] to help potassium levels stay down  CKD (chronic kidney disease), stage V Labs from 5/6 noted potassium 6.8, CO2 16, creatinine 6.51, BUN 62, and anion gap 11.  Patient appears to be end-stage renal disease, but declining dialysis.  Currently on hospice. -Delirium  precautions -Continue current medications for itching -Continue to discuss progression of kidney failure without dialysis  Paroxysmal atrial fibrillation Patient appeared to be in a sinus tachycardia on initial tracing.CHA2DS2-VASc score = 7.  Recommended patient be on Eliquis and continued as part of hospice goals reviewed from cardiology notes on 4/19. -Continue Eliquis  History of CVA Due to occlusion of left posterior cerebral artery back in 2017. -Continue Eliquis  Essential hypertension Blood pressures elevated initially up to 175/86. -Continue amlodipine and hydralazine -Hydralazine IV as needed for elevated blood pressures  Anemia of chronic kidney disease Hemoglobin was 8.1->7.8.  Patient did not report any issues with bleeding. -Recheck CBC tomorrow morning  BPH -Continue finasteride and tamsulosin  On hospice care Due to end-stage renal disease without Will to go on hemodialysis. -Haldol as needed for confusion/agitation   Discharge Planning- Ongoing, possible discharge today if medically stable.  Patient and niece agreeable to this plan.  Please use [facility] for any transportation needs as we contract with them for this service.   Family Contact- Talked with patient at bedside and spoke with niece Derrick Hendrix via phone.  She is up to date on plan and agreeable to discharge if medically stable.  No other needs identified. IDT: updated ACC team Goals of Care: Ongoing, at this time patient desires to go home when medically stable.  Thank you for the opportunity to participate in this patient's care, please don't hesitate to call for  any hospice related questions or concerns.   Doreatha Martin, RN, BSN [facility] Liaison [phone]

## 2023-04-22 NOTE — Evaluation (Signed)
Physical Therapy Evaluation Patient Details Name: Derrick Hendrix. MRN: [id number] DOB: February 21, 1941 Today's Date: 04/22/2023  History of Present Illness  Pt is an 82 y/o M admitted on 04/20/23 after presenting with abnormal lab work (elevated K+). Pt is being treated for hyperkalemia 2/2 ERSD. PMH: HTN, HLD, CVA, PAD, DM2, ESRD declining HD, heart murmur, neuropathic pain, PAD, stroke  Clinical Impression  Pt seen for PT evaluation with pt requesting to sleep but eventually agreeable to participation. Question accuracy of pt's report of PLOF & home set up, but pt does note he ambulates with walker at baseline. On this date, pt is able to complete bed mobility with mod I, STS with mod I & ambulate around unit with RW & supervision. Pt difficult to understand at times 2/2 expressive difficulties. Will continue to follow pt acutely to address balance, strengthening & safety with mobility.   Recommendations for follow up therapy are one component of a multi-disciplinary discharge planning process, led by the attending physician.  Recommendations may be updated based on patient status, additional functional criteria and insurance authorization.  Follow Up Recommendations       Assistance Recommended at Discharge Intermittent Supervision/Assistance  Patient can return home with the following  A little help with walking and/or transfers;A little help with bathing/dressing/bathroom;Assistance with cooking/housework;Assist for transportation;Help with stairs or ramp for entrance    Equipment Recommendations None recommended by PT  Recommendations for Other Services       Functional Status Assessment Patient has had a recent decline in their functional status and demonstrates the ability to make significant improvements in function in a reasonable and predictable amount of time.     Precautions / Restrictions Precautions Precautions: Fall Restrictions Weight Bearing Restrictions: No       Mobility  Bed Mobility Overal bed mobility: Modified Independent Bed Mobility: Supine to Sit     Supine to sit: Modified independent (Device/Increase time), HOB elevated     General bed mobility comments: slightly extra time to push trunk upright to sitting    Transfers Overall transfer level: Modified independent Equipment used: Rolling walker (2 wheels) Transfers: Sit to/from Stand Sit to Stand: Modified independent (Device/Increase time)           General transfer comment: Pt is able to transfer STS from EOB independently without AD, STS from standard chair with RW & mod I with extra time to power up to standing.    Ambulation/Gait Ambulation/Gait assistance: Supervision Gait Distance (Feet): 110 Feet Assistive device: Rolling walker (2 wheels) Gait Pattern/deviations: Decreased step length - right, Decreased step length - left, Decreased stride length, Decreased weight shift to left Gait velocity: slightly decreased        Stairs            Wheelchair Mobility    Modified Rankin (Stroke Patients Only)       Balance Overall balance assessment: Needs assistance Sitting-balance support: Feet supported Sitting balance-Leahy Scale: Good     Standing balance support: Bilateral upper extremity supported, During functional activity Standing balance-Leahy Scale: Fair                               Pertinent Vitals/Pain Pain Assessment Pain Assessment: Faces Faces Pain Scale: No hurt    Home Living Family/patient expects to be discharged to:: Private residence Living Arrangements: Other relatives (niece) Available Help at Discharge: Family Type of Home: Apartment Home Access: Stairs to enter Entrance Stairs-Rails:  (  unable to state) Entrance Stairs-Number of Steps: 4-5   Home Layout: One level Home Equipment: Agricultural consultant (2 wheels) Additional Comments: Pt does not seem to be most reliable historian. Does report he lives with his  niece in an apartment with 4-5 steps to enter, unsure if there is a railing to hold to. Pt notes he uses RW for mobility & states he was receiving HH services but unsure if this was an aide, nurse, or therapy. Will attempt to confirm home set up & PLOF with family as able.    Prior Function               Mobility Comments: Pt notes he was ambulatory with RW.       Hand Dominance        Extremity/Trunk Assessment   Upper Extremity Assessment Upper Extremity Assessment: Generalized weakness    Lower Extremity Assessment Lower Extremity Assessment: Generalized weakness       Communication   Communication: Expressive difficulties (Pt difficult to understand throughout session.)  Cognition Arousal/Alertness: Awake/alert Behavior During Therapy: WFL for tasks assessed/performed Overall Cognitive Status: No family/caregiver present to determine baseline cognitive functioning                                 General Comments: Follows simple commands with PRN extra time, question pt's accuracy of PLOF/home set up information.        General Comments General comments (skin integrity, edema, etc.): Max HR 123 bpm    Exercises     Assessment/Plan    PT Assessment Patient needs continued PT services  PT Problem List Decreased strength;Decreased activity tolerance;Decreased balance;Decreased mobility;Decreased knowledge of use of DME;Decreased safety awareness       PT Treatment Interventions DME instruction;Therapeutic exercise;Gait training;Balance training;Stair training;Functional mobility training;Therapeutic activities;Patient/family education;Neuromuscular re-education    PT Goals (Current goals can be found in the Care Plan section)  Acute Rehab PT Goals Patient Stated Goal: to sleep PT Goal Formulation: With patient Time For Goal Achievement: 05/06/23 Potential to Achieve Goals: Good Additional Goals Additional Goal #1: Pt will score >/= 19 on DGI  to reduce fall risk with mobility.    Frequency Min 2X/week     Co-evaluation               AM-PAC PT "6 Clicks" Mobility  Outcome Measure Help needed turning from your back to your side while in a flat bed without using bedrails?: None Help needed moving from lying on your back to sitting on the side of a flat bed without using bedrails?: A Little Help needed moving to and from a bed to a chair (including a wheelchair)?: A Little Help needed standing up from a chair using your arms (e.g., wheelchair or bedside chair)?: None Help needed to walk in hospital room?: A Little Help needed climbing 3-5 steps with a railing? : A Little 6 Click Score: 20    End of Session   Activity Tolerance: Patient tolerated treatment well Patient left: with call bell/phone within reach;in chair;with nursing/sitter in room Nurse Communication: Mobility status PT Visit Diagnosis: Unsteadiness on feet (R26.81);Other abnormalities of gait and mobility (R26.89);Muscle weakness (generalized) (M62.81)    Time: 4098-1191 PT Time Calculation (min) (ACUTE ONLY): 12 min   Charges:   PT Evaluation $PT Eval Low Complexity: 1 Low          Aleda Grana, PT, DPT 04/22/23, 8:53 AM  Sandi Mariscal 04/22/2023, 8:52 AM

## 2023-04-22 NOTE — TOC Initial Note (Signed)
Transition of Care (TOC) - Initial/Assessment Note    Patient Details  Name: Derrick Hendrix. MRN: [id number] Date of Birth: 12-08-1941  Transition of [facility]) CM/SW Contact:    Janae Bridgeman, RN Phone Number: 04/22/2023, 11:11 AM  Clinical Narrative:                 CM met with the patient at the bedside and the patient was admitted with abnormal labs and is currently active with [facility].    The patient was awake and alert and states that he lives with his Niece, Bonita Quin and his other niece, Kennith Center lives nearby and assists with care as well.  The patient has hospital bed and RW at home.  The patient states that he normally rides home from the hospital by car.  The niece works until 5 pm so if patient is medically stable - may discharge after 6 pm tonight if medically clear to return home.  Bedside nursing is aware and she will reach out to family to provide transportation to home.  I called Niece, Bonita Quin by phone and she confirmed that once the patient is medically stable that patient can discharge home by family car.  Expected Discharge Plan: Home w Hospice Care Barriers to Discharge: Continued Medical Work up   Patient Goals and CMS Choice Patient states their goals for this hospitalization and ongoing recovery are:: To return home CMS Medicare.gov Compare Post Acute Care list provided to:: Patient Choice offered to / list presented to : Patient [place] ownership interest in [facility].provided to:: Patient    Expected Discharge Plan and Services   Discharge Planning Services: CM Consult   Living arrangements for the past 2 months: Single Family Home                                      Prior Living Arrangements/Services Living arrangements for the past 2 months: Single Family Home Lives with:: Relatives (Lives with Doretha Imus) Patient language and need for interpreter reviewed:: Yes Do you feel safe going back to the  place where you live?: Yes      Need for Family Participation in Patient Care: Yes (Comment) Care giver support system in place?: Yes (comment) Current home services: DME (Hospital bed, RW at the home per patient) Criminal Activity/Legal Involvement Pertinent to Current Situation/Hospitalization: No - Comment as needed  Activities of Daily Living Home Assistive Devices/Equipment: Bedside commode/3-in-1, Walker (specify type), Shower chair with back ADL Screening (condition at time of admission) Patient's cognitive ability adequate to safely complete daily activities?: Yes Is the patient deaf or have difficulty hearing?: No Does the patient have difficulty seeing, even when wearing glasses/contacts?: No Does the patient have difficulty concentrating, remembering, or making decisions?: No Patient able to express need for assistance with ADLs?: Yes Does the patient have difficulty dressing or bathing?: Yes Independently performs ADLs?: No Communication: Independent Dressing (OT): Needs assistance Is this a change from baseline?: Pre-admission baseline Grooming: Needs assistance Is this a change from baseline?: Pre-admission baseline Feeding: Independent Bathing: Needs assistance Is this a change from baseline?: Pre-admission baseline Toileting: Needs assistance Is this a change from baseline?: Pre-admission baseline In/Out Bed: Needs assistance Is this a change from baseline?: Pre-admission baseline Walks in Home: Independent with device (comment) Does the patient have difficulty walking or climbing stairs?: No Weakness of Legs: Right Weakness of Arms/Hands: Both  Permission Sought/Granted Permission sought to share information with : Case Manager, Family Supports Permission granted to share information with : Yes, Verbal Permission Granted     Permission granted to share info w AGENCY: Patient is active with [facility]  Permission granted to share info w  Relationship: Jonn Milia - [phone]     Emotional Assessment Appearance:: Appears stated age Attitude/Demeanor/Rapport: Gracious Affect (typically observed): Accepting Orientation: : Oriented to Self, Oriented to Place, Oriented to  Time, Oriented to Situation Alcohol / Substance Use: Not Applicable Psych Involvement: No (comment)  Admission diagnosis:  Hyperkalemia [E87.5] Chronic kidney disease, unspecified CKD stage [N18.9] Patient Active Problem List   Diagnosis Date Noted   Anemia of chronic disease 04/21/2023   Urinary tract infection, E. coli 03/28/2022   Sepsis (HCC) 03/28/2022   Peripheral neuropathy 03/02/2022   Hospice care 11/30/2020   Dysphagia 08/23/2020   Psoriasis 04/23/2020   Normal anion gap metabolic acidosis    PAF (paroxysmal atrial fibrillation) (HCC) 10/14/2018   Type 2 diabetes mellitus (HCC) 09/04/2017   Hemiballism 08/31/2017   Ischemic pain of foot, right    Severe peripheral arterial disease (HCC) 02/27/2017   Benign prostatic hyperplasia with urinary obstruction    Orthostatic hypotension 04/04/2016   History of CVA (cerebrovascular accident) 04/04/2016   Urinary retention 03/20/2016   Cerebrovascular accident (CVA) due to occlusion of left posterior cerebral artery (HCC)    Constipation 11/07/2014   Essential hypertension    Normocytic anemia 10/20/2014   Hyporeninemic hypoaldosteronism (HCC) 08/24/2014   Hyperkalemia 08/01/2014   Healthcare maintenance 07/13/2014   Atherosclerosis of leg with intermittent claudication, R 06/29/2014   Current smoker 06/27/2014   CKD (chronic kidney disease), stage V (HCC) 06/26/2014   PCP:  Modena Slater, DO Pharmacy:   [id number] [place] [place] - [address] [place] [address] Phone: [phone] Fax: [phone]  [id number] [place],  - [address] [place] [address] Phone: [phone] Fax:  [phone]     Social Determinants of Health (SDOH) Social History: SDOH Screenings   Food Insecurity: No Food Insecurity (04/21/2023)  Housing: Low Risk  (04/21/2023)  Transportation Needs: No Transportation Needs (04/21/2023)  Utilities: Not At Risk (04/21/2023)  Depression (PHQ2-9): Low Risk  (03/25/2022)  Financial Resource Strain: Low Risk  (03/05/2020)  Social Connections: Unknown (06/13/2021)  Tobacco Use: Medium Risk (04/20/2023)   SDOH Interventions:     Readmission Risk Interventions    04/22/2023   11:11 AM  Readmission Risk Prevention Plan  Transportation Screening Complete  PCP or Specialist Appt within 3-5 Days Complete  HRI or Home Care Consult Complete  Social Work Consult for Recovery Care Planning/Counseling Complete  Palliative Care Screening Complete  Medication Review Oceanographer) Complete

## 2023-04-23 ENCOUNTER — Encounter (HOSPITAL_COMMUNITY): Payer: Self-pay

## 2023-04-25 ENCOUNTER — Other Ambulatory Visit: Payer: Self-pay | Admitting: Student

## 2023-04-27 ENCOUNTER — Other Ambulatory Visit: Payer: Self-pay | Admitting: Student

## 2023-04-27 ENCOUNTER — Encounter (HOSPITAL_COMMUNITY): Payer: Self-pay

## 2023-04-27 DIAGNOSIS — E875 Hyperkalemia: Secondary | ICD-10-CM

## 2023-04-27 MED ORDER — VELTASSA 8.4 G PO PACK: 8.4000 g | PACK | Freq: Every day | ORAL | 0 refills | Status: DC

## 2023-04-27 NOTE — Progress Notes (Signed)
Received message patient's insurance has not covered Lokelma. Will plan to send in Veltassa. I have discussed with our clinic, who will reach out to Derrick Hendrix or one of his nieces to have him come in for a lab-only appointment in the upcoming days.   - Veltassa 8.4g daily - BMP in the next few days  [name], MD Internal Medicine PGY-3 Pager: [phone]

## 2023-04-27 NOTE — Telephone Encounter (Signed)
Dr. Marijo Conception has changed Rx to [facility]. Patient's last OV at [facility] was 03/25/2022. He will need OV in addition to labs to reestablish with [facility] clinic.

## 2023-05-01 ENCOUNTER — Encounter: Admitting: Student

## 2023-05-04 ENCOUNTER — Encounter (HOSPITAL_COMMUNITY): Payer: Self-pay

## 2023-05-06 ENCOUNTER — Other Ambulatory Visit: Payer: Self-pay

## 2023-05-06 ENCOUNTER — Encounter: Payer: Self-pay | Admitting: Student

## 2023-05-06 ENCOUNTER — Other Ambulatory Visit: Payer: Self-pay | Admitting: Student

## 2023-05-06 ENCOUNTER — Ambulatory Visit (INDEPENDENT_AMBULATORY_CARE_PROVIDER_SITE_OTHER): Admitting: Student

## 2023-05-06 VITALS — BP 112/70 | HR 96 | Temp 98.1°F | Ht 74.0 in | Wt 168.3 lb

## 2023-05-06 DIAGNOSIS — N183 Chronic kidney disease, stage 3 unspecified: Secondary | ICD-10-CM

## 2023-05-06 DIAGNOSIS — N185 Chronic kidney disease, stage 5: Secondary | ICD-10-CM

## 2023-05-06 DIAGNOSIS — E875 Hyperkalemia: Secondary | ICD-10-CM

## 2023-05-06 DIAGNOSIS — E1122 Type 2 diabetes mellitus with diabetic chronic kidney disease: Secondary | ICD-10-CM | POA: Diagnosis not present

## 2023-05-06 LAB — POCT GLYCOSYLATED HEMOGLOBIN (HGB A1C): Hemoglobin A1C: 4.9 % (ref 4.0–5.6)

## 2023-05-06 LAB — GLUCOSE, CAPILLARY: Glucose-Capillary: 136 mg/dL — ABNORMAL HIGH (ref 70–99)

## 2023-05-06 MED ORDER — HYDRALAZINE HCL 10 MG PO TABS
15.0000 mg | ORAL_TABLET | Freq: Three times a day (TID) | ORAL | 11 refills | Status: AC
Start: 2023-05-06 — End: 2024-05-05

## 2023-05-06 MED ORDER — VELTASSA 8.4 G PO PACK: 8.4000 g | PACK | Freq: Every day | ORAL | 0 refills | Status: DC

## 2023-05-06 NOTE — Progress Notes (Addendum)
CC: Follow up   HPI:  Derrick Hendrix. is a 82 y.o. male with a past medical history end-stage kidney disease, hypertension, CVA presents to the clinic for hospital follow-up.  Patient was recently seen in the hospital from 05/07 to 05/08. Patient was hypokalemic at that time with potassium of 6.8 on admission.  Patient was treated with [facility] and appropriate care and potassium came back down to normal levels.  Patient is ESRD, and has been declining dialysis and is currently on hospice.  Please see assessment and plan for full HPI.  Current medications including Hypertension: Amlodipine 5 mg daily hydralazine 10 mg 3 times daily Hypokalemia: Veltassa 8.4 g daily BPH: Tamsulosin 0.8 mg daily, finasteride 5 mg daily Paroxysmal atrial fibrillation: Eliquis 2.5 mg twice daily Agitation: Haloperidol 2 mg every 6 hour prn   Past Medical History:  Diagnosis Date   Abscess of foot without toes, left 08/28/2017   Chronic kidney disease (CKD), stage III (moderate) (HCC)    Constipation 11/07/2014   Daily headache    "lately" (04/17/2016)   Depression    Dizziness 03/07/2016   Headache 06/26/2014   Heart murmur    Hyperkalemia 10/05/2018   Hyperlipemia 11/11/2016   Hypertension    Neuropathic pain 02/06/2015   Osteomyelitis (HCC)    PAD (peripheral artery disease) (HCC)    Paronychia of great toe, right    Preop cardiovascular exam    Right foot pain 02/09/2017   Stable angina 06/27/2014   Stroke (HCC) 03/2016   hx of PAD; j"ust lots of headaches since" (04/17/2016)   Vitamin B12 deficiency 06/27/2014   Weight loss, unintentional 06/27/2014     Current Outpatient Medications:    amLODipine (NORVASC) 5 MG tablet, Take 5 mg by mouth 2 (two) times daily., Disp: , Rfl:    antiseptic oral rinse (BIOTENE) LIQD, Apply 15 mLs topically 2 (two) times daily., Disp: , Rfl:    apixaban (ELIQUIS) 2.5 MG TABS tablet, Take 1 tablet (2.5 mg total) by mouth 2 (two) times daily., Disp: 60 tablet, Rfl:  5   BANOPHEN 25 MG capsule, Take 25 mg by mouth 3 (three) times daily., Disp: , Rfl:    camphor-menthol (SARNA) lotion, Apply topically as needed for itching. (Patient taking differently: Apply 1 Application topically as needed for itching.), Disp: 222 mL, Rfl: 0   finasteride (PROSCAR) 5 MG tablet, TAKE ONE TABLET BY MOUTH DAILY (Patient taking differently: Take 5 mg by mouth daily.), Disp: 90 tablet, Rfl: 4   haloperidol (HALDOL) 2 MG tablet, Take 1 tablet (2 mg total) by mouth every 6 (six) hours as needed for agitation (or delirium)., Disp: , Rfl:    hydrALAZINE (APRESOLINE) 10 MG tablet, Take 1.5 tablets (15 mg total) by mouth 3 (three) times daily., Disp: 135 tablet, Rfl: 11   tamsulosin (FLOMAX) 0.4 MG CAPS capsule, TAKE TWO CAPSULES BY MOUTH DAILY (Patient taking differently: Take 0.8 mg by mouth daily.), Disp: 180 capsule, Rfl: 4   traZODone (DESYREL) 50 MG tablet, Take 50 mg by mouth at bedtime., Disp: , Rfl:    VELTASSA 8.4 g packet, TAKE 1 PACKET (8.4 G TOTAL) BY MOUTH DAILY., Disp: 30 packet, Rfl: 0  Review of Systems:    Negative except for what is stated in HPI  Physical Exam:  Vitals:   05/06/23 1059  BP: 112/70  Pulse: 96  Temp: 98.1 F (36.7 C)  TempSrc: Oral  SpO2: 100%  Weight: 168 lb 4.8 oz (76.3 kg)  Height:  6\' 2"  (1.88 m)   General: Patient is sitting comfortably in the room  Head: Normocephalic, atraumatic Cardio: Regular rate and rhythm, no murmurs, rubs or gallops. 2+ pulses to bilateral upper and lower extremities  Pulmonary: Clear to ausculation bilaterally with no rales, rhonchi, and crackles  Extremities: Right lower extremity with stage I ulcer noted at lateral malleolus.  No bruising appreciated   Assessment & Plan:   Type 2 diabetes mellitus (HCC) A1c today 4.9.  He states he is doing well.  Plan: -Discussion about continuing diabetes screening given patient on hospice had -Likely will not need another A1c  CKD (chronic kidney disease),  stage V (HCC) Patient has end-stage renal disease.  Patient is declining hemodialysis.  He understands this decision and understands that this will ultimately lead to his passing.  He has come to terms with this.  Patient is followed by hospice care.  Patient follows up in the clinic today after having episode of hyperkalemia.  Had lengthy discussion with patient about continuously checking his potassium given that it could be something that may not be needed.  I asked him why he wants to get his potassium checked, and he states he would like to live longer.  I have long discussion about him stating that if we do not start dialysis, the ultimate and will be his passing away which she understands.  Given this, there is also some indication about checking potassium given that elevated potassium can cause arrhythmias and lead to death as well.  I did discuss with him and [name] about talking with his hospice care team and filling out a MOST form as he states that if he does need hospitalization, he likely will not want to go.  Plan: -BMP today -Continue goals of care conversation with hospice care team  Hyperkalemia Please see end-stage renal disease assessment, but in summary patient does want potassium checked.  He does state that if he needs admission for hyperkalemia, he will likely decline.  He states he just wants to get his potassium checked today to see if it has lowered.  Will continue with Veltassa.  He states he did never pick up his Veltassa given that it was sent to the wrong pharmacy.  Will check potassium today, and have patient continue conversations with his hospice team for goals of care.  Plan: -Patient given information about MOST form -BMP pending -Send Veltassa to appropriate pharmacy  Addendum: -K levels still elevated. Continue with plan with valtessa. Have patient and his hospice team sit down to have a talk about goals of care and if he wants to get these test done as  this is going to be an ongoing problem.    Patient discussed with Dr. [name], DO PGY-1 Internal Medicine Resident  Pager: [phone]

## 2023-05-06 NOTE — Patient Instructions (Addendum)
Derrick Bolt Jr.,Thank you for allowing me to take part in your care today.  Here are your instructions.  1. For your potassium we can check labs today. I will call you with the results. I have sent the valtessa, only pick it up if you need it.   2. Depending on how high it is, it is something that may need admission. Think about the goals of care and what you want moving forward. Talk to your hospice team about a MOST form.  3. Your A1c was 4.9 today. This is good and there is nothing else to do with this.   4. Please continue to take all of your medication as prescribed.   Thank you, Dr. Allena Katz  If you have any other questions please contact the internal medicine clinic at [phone]

## 2023-05-06 NOTE — Assessment & Plan Note (Addendum)
Please see end-stage renal disease assessment, but in summary patient does want potassium checked.  He does state that if he needs admission for hyperkalemia, he will likely decline.  He states he just wants to get his potassium checked today to see if it has lowered.  Will continue with Veltassa.  He states he did never pick up his Veltassa given that it was sent to the wrong pharmacy.  Will check potassium today, and have patient continue conversations with his hospice team for goals of care.  Plan: -Patient given information about MOST form -BMP pending -Send Veltassa to appropriate pharmacy  Addendum: -K levels still elevated. Continue with plan with valtessa. Have patient and his hospice team sit down to have a talk about goals of care and if he wants to get these test done as this is going to be an ongoing problem.

## 2023-05-06 NOTE — Assessment & Plan Note (Signed)
Patient has end-stage renal disease.  Patient is declining hemodialysis.  He understands this decision and understands that this will ultimately lead to his passing.  He has come to terms with this.  Patient is followed by hospice care.  Patient follows up in the clinic today after having episode of hyperkalemia.  Had lengthy discussion with patient about continuously checking his potassium given that it could be something that may not be needed.  I asked him why he wants to get his potassium checked, and he states he would like to live longer.  I have long discussion about him stating that if we do not start dialysis, the ultimate and will be his passing away which she understands.  Given this, there is also some indication about checking potassium given that elevated potassium can cause arrhythmias and lead to death as well.  I did discuss with him and [name] about talking with his hospice care team and filling out a MOST form as he states that if he does need hospitalization, he likely will not want to go.  Plan: -BMP today -Continue goals of care conversation with hospice care team

## 2023-05-06 NOTE — Assessment & Plan Note (Signed)
A1c today 4.9.  He states he is doing well.  Plan: -Discussion about continuing diabetes screening given patient on hospice had -Likely will not need another A1c

## 2023-05-07 ENCOUNTER — Encounter: Admitting: Student

## 2023-05-07 NOTE — Telephone Encounter (Addendum)
[facility] [address] and [address] locations. Apparently the first Veltassa rx was sent to [address] and it filled and waiting on pt to pick up since May 13th.at no cost. [facility] [address] stated they could not fill rx until June 3rd (b/c rx was sent and filled at the other pharmacy). [facility] [address] stated pt has 2 profiles ;this is why the pharmacy was unable to see all meds pt is on. I called pt to tell him [facility] is ready at no cost - no answer; left message to call the office.

## 2023-05-08 ENCOUNTER — Telehealth: Payer: Self-pay | Admitting: Student

## 2023-05-08 LAB — BMP8+ANION GAP
Anion Gap: 18 mmol/L (ref 10.0–18.0)
BUN/Creatinine Ratio: 9 — ABNORMAL LOW (ref 10–24)
BUN: 55 mg/dL — ABNORMAL HIGH (ref 8–27)
CO2: 13 mmol/L — ABNORMAL LOW (ref 20–29)
Calcium: 8.6 mg/dL (ref 8.6–10.2)
Chloride: 110 mmol/L — ABNORMAL HIGH (ref 96–106)
Creatinine, Ser: 5.8 mg/dL — ABNORMAL HIGH (ref 0.76–1.27)
Glucose: 75 mg/dL (ref 70–99)
Potassium: 5.7 mmol/L — ABNORMAL HIGH (ref 3.5–5.2)
Sodium: 141 mmol/L (ref 134–144)
eGFR: 9 mL/min/{1.73_m2} — ABNORMAL LOW (ref >59)

## 2023-05-08 NOTE — Telephone Encounter (Signed)
Rec'd a phone call requesting that the following medication be sent to the [facility] on  [address] [place], [place] [address] Phone: [phone] in the future.  The pt  did pick up the current prescription but wanted clarify the correct location the next time.  Please call back if questions.

## 2023-05-08 NOTE — Telephone Encounter (Signed)
Called pt - no answer; left message medication was filled at [facility] [address] at no cost. And to call for any questions.

## 2023-05-08 NOTE — Telephone Encounter (Signed)
Derrick Hendrix stated this issue has been resolved and pt has the medication.

## 2023-05-08 NOTE — Progress Notes (Signed)
[facility] Attending  Case discussed with Dr. Patel  At the time of the visit.  We reviewed the resident's history and exam and pertinent patient test results.  I agree with the assessment, diagnosis, and plan of care documented in the resident's note.  

## 2023-05-25 ENCOUNTER — Other Ambulatory Visit: Payer: Self-pay

## 2023-06-05 ENCOUNTER — Other Ambulatory Visit: Payer: Self-pay | Admitting: Student

## 2023-09-15 DEATH — deceased

## 2024-05-19 ENCOUNTER — Encounter: Payer: Self-pay | Admitting: Student

## 2024-05-25 ENCOUNTER — Encounter: Payer: Self-pay | Admitting: Student
